# Patient Record
Sex: Male | Born: 1940
Health system: Southern US, Community
[De-identification: ages and names within clinical notes are randomized; demographics above are authoritative.]

## PROBLEM LIST (undated history)

## (undated) DIAGNOSIS — J449 Chronic obstructive pulmonary disease, unspecified: Secondary | ICD-10-CM

## (undated) DIAGNOSIS — L0390 Cellulitis, unspecified: Secondary | ICD-10-CM

## (undated) DIAGNOSIS — I639 Cerebral infarction, unspecified: Secondary | ICD-10-CM

## (undated) DIAGNOSIS — I509 Heart failure, unspecified: Secondary | ICD-10-CM

## (undated) DIAGNOSIS — G4730 Sleep apnea, unspecified: Secondary | ICD-10-CM

## (undated) DIAGNOSIS — E785 Hyperlipidemia, unspecified: Secondary | ICD-10-CM

## (undated) DIAGNOSIS — I10 Essential (primary) hypertension: Secondary | ICD-10-CM

## (undated) DIAGNOSIS — E039 Hypothyroidism, unspecified: Secondary | ICD-10-CM

## (undated) DIAGNOSIS — E079 Disorder of thyroid, unspecified: Secondary | ICD-10-CM

## (undated) DIAGNOSIS — T884XXA Failed or difficult intubation, initial encounter: Secondary | ICD-10-CM

## (undated) DIAGNOSIS — I4891 Unspecified atrial fibrillation: Secondary | ICD-10-CM

## (undated) DIAGNOSIS — N289 Disorder of kidney and ureter, unspecified: Secondary | ICD-10-CM

## (undated) HISTORY — DX: Chronic obstructive pulmonary disease, unspecified: J44.9

## (undated) HISTORY — DX: Essential (primary) hypertension: I10

## (undated) HISTORY — DX: Disorder of thyroid, unspecified: E07.9

## (undated) HISTORY — DX: Sleep apnea, unspecified: G47.30

## (undated) HISTORY — DX: Cerebral infarction, unspecified: I63.9

## (undated) HISTORY — DX: Unspecified atrial fibrillation: I48.91

## (undated) HISTORY — PX: CARDIAC CATHETERIZATION: SHX172

## (undated) HISTORY — DX: Disorder of kidney and ureter, unspecified: N28.9

## (undated) HISTORY — PX: TONSILLECTOMY: SUR1361

## (undated) HISTORY — PX: CATARACT EXTRACTION W/ INTRAOCULAR LENS  IMPLANT, BILATERAL: SHX1307

## (undated) HISTORY — DX: Hyperlipidemia, unspecified: E78.5

---

## 1977-02-14 HISTORY — PX: OTHER SURGICAL HISTORY: SHX169

## 2006-02-14 HISTORY — PX: COLONOSCOPY: SHX174

## 2006-12-29 ENCOUNTER — Ambulatory Visit: Payer: Self-pay | Admitting: Internal Medicine

## 2007-01-29 ENCOUNTER — Ambulatory Visit: Payer: Self-pay | Admitting: Gastroenterology

## 2007-02-12 ENCOUNTER — Ambulatory Visit: Payer: Self-pay | Admitting: Gastroenterology

## 2007-03-13 ENCOUNTER — Ambulatory Visit: Payer: Self-pay | Admitting: Gastroenterology

## 2008-11-18 ENCOUNTER — Inpatient Hospital Stay: Payer: Self-pay | Admitting: Internal Medicine

## 2010-03-22 ENCOUNTER — Ambulatory Visit: Payer: Self-pay | Admitting: Cardiology

## 2010-05-03 ENCOUNTER — Inpatient Hospital Stay: Payer: Self-pay | Admitting: Internal Medicine

## 2010-05-25 ENCOUNTER — Encounter: Payer: Self-pay | Admitting: Specialist

## 2010-06-15 ENCOUNTER — Encounter: Payer: Self-pay | Admitting: Specialist

## 2010-07-16 ENCOUNTER — Encounter: Payer: Self-pay | Admitting: Specialist

## 2010-08-15 ENCOUNTER — Encounter: Payer: Self-pay | Admitting: Specialist

## 2011-08-09 ENCOUNTER — Ambulatory Visit: Payer: Self-pay | Admitting: Ophthalmology

## 2011-08-30 ENCOUNTER — Ambulatory Visit: Payer: Self-pay | Admitting: Ophthalmology

## 2011-09-20 ENCOUNTER — Ambulatory Visit: Payer: Self-pay | Admitting: Ophthalmology

## 2012-06-18 ENCOUNTER — Inpatient Hospital Stay: Payer: Self-pay | Admitting: Internal Medicine

## 2012-06-18 LAB — CBC WITH DIFFERENTIAL/PLATELET
Basophil #: 0.1 10*3/uL (ref 0.0–0.1)
Eosinophil %: 0.3 %
HCT: 47.2 % (ref 40.0–52.0)
HGB: 15.9 g/dL (ref 13.0–18.0)
Lymphocyte %: 14.2 %
MCH: 33.1 pg (ref 26.0–34.0)
MCHC: 33.6 g/dL (ref 32.0–36.0)
Neutrophil #: 8.9 10*3/uL — ABNORMAL HIGH (ref 1.4–6.5)

## 2012-06-18 LAB — COMPREHENSIVE METABOLIC PANEL
Albumin: 3.7 g/dL (ref 3.4–5.0)
Anion Gap: 10 (ref 7–16)
BUN: 78 mg/dL — ABNORMAL HIGH (ref 7–18)
Bilirubin,Total: 0.9 mg/dL (ref 0.2–1.0)
Co2: 43 mmol/L (ref 21–32)
EGFR (African American): 28 — ABNORMAL LOW
EGFR (Non-African Amer.): 24 — ABNORMAL LOW
Glucose: 118 mg/dL — ABNORMAL HIGH (ref 65–99)
Osmolality: 283 (ref 275–301)
Potassium: 1.8 mmol/L — CL (ref 3.5–5.1)
Sodium: 129 mmol/L — ABNORMAL LOW (ref 136–145)
Total Protein: 7.8 g/dL (ref 6.4–8.2)

## 2012-06-18 LAB — POTASSIUM: Potassium: 2.2 mmol/L — CL (ref 3.5–5.1)

## 2012-06-18 LAB — TROPONIN I: Troponin-I: 0.28 ng/mL — ABNORMAL HIGH

## 2012-06-18 LAB — CK TOTAL AND CKMB (NOT AT ARMC)
CK, Total: 448 U/L — ABNORMAL HIGH (ref 35–232)
CK-MB: 3.6 ng/mL (ref 0.5–3.6)

## 2012-06-18 LAB — MAGNESIUM: Magnesium: 3 mg/dL — ABNORMAL HIGH

## 2012-06-19 LAB — BASIC METABOLIC PANEL
Anion Gap: 7 (ref 7–16)
Anion Gap: 4 — ABNORMAL LOW (ref 7–16)
BUN: 59 mg/dL — ABNORMAL HIGH (ref 7–18)
Co2: 38 mmol/L — ABNORMAL HIGH (ref 21–32)
Creatinine: 1.81 mg/dL — ABNORMAL HIGH (ref 0.60–1.30)
EGFR (African American): 42 — ABNORMAL LOW
Glucose: 101 mg/dL — ABNORMAL HIGH (ref 65–99)
Osmolality: 287 (ref 275–301)
Osmolality: 285 (ref 275–301)
Potassium: 2.7 mmol/L — ABNORMAL LOW (ref 3.5–5.1)

## 2012-06-19 LAB — MAGNESIUM: Magnesium: 2.8 mg/dL — ABNORMAL HIGH

## 2012-06-19 LAB — PHOSPHORUS: Phosphorus: 2.1 mg/dL — ABNORMAL LOW (ref 2.5–4.9)

## 2012-06-19 LAB — CK TOTAL AND CKMB (NOT AT ARMC): CK-MB: 2 ng/mL (ref 0.5–3.6)

## 2012-06-19 LAB — TROPONIN I: Troponin-I: 0.26 ng/mL — ABNORMAL HIGH

## 2012-06-20 LAB — BASIC METABOLIC PANEL
Anion Gap: 5 — ABNORMAL LOW (ref 7–16)
BUN: 34 mg/dL — ABNORMAL HIGH (ref 7–18)
Calcium, Total: 8.1 mg/dL — ABNORMAL LOW (ref 8.5–10.1)
Co2: 36 mmol/L — ABNORMAL HIGH (ref 21–32)
Creatinine: 1.45 mg/dL — ABNORMAL HIGH (ref 0.60–1.30)
EGFR (Non-African Amer.): 48 — ABNORMAL LOW
Glucose: 87 mg/dL (ref 65–99)
Potassium: 2.9 mmol/L — ABNORMAL LOW (ref 3.5–5.1)

## 2013-06-28 ENCOUNTER — Ambulatory Visit: Payer: Self-pay

## 2013-06-30 DIAGNOSIS — I429 Cardiomyopathy, unspecified: Secondary | ICD-10-CM | POA: Insufficient documentation

## 2013-06-30 DIAGNOSIS — I129 Hypertensive chronic kidney disease with stage 1 through stage 4 chronic kidney disease, or unspecified chronic kidney disease: Secondary | ICD-10-CM | POA: Insufficient documentation

## 2013-06-30 DIAGNOSIS — E039 Hypothyroidism, unspecified: Secondary | ICD-10-CM | POA: Insufficient documentation

## 2013-06-30 DIAGNOSIS — K220 Achalasia of cardia: Secondary | ICD-10-CM | POA: Insufficient documentation

## 2013-06-30 DIAGNOSIS — G4733 Obstructive sleep apnea (adult) (pediatric): Secondary | ICD-10-CM | POA: Insufficient documentation

## 2013-07-23 ENCOUNTER — Ambulatory Visit: Payer: Self-pay | Admitting: Internal Medicine

## 2013-07-23 LAB — CREATININE, SERUM
CREATININE: 1.59 mg/dL — AB (ref 0.60–1.30)
EGFR (Non-African Amer.): 42 — ABNORMAL LOW
GFR CALC AF AMER: 49 — AB

## 2013-08-05 ENCOUNTER — Encounter: Payer: Self-pay | Admitting: Otolaryngology

## 2013-08-14 ENCOUNTER — Encounter: Payer: Self-pay | Admitting: Otolaryngology

## 2013-09-14 ENCOUNTER — Encounter: Payer: Self-pay | Admitting: Otolaryngology

## 2013-10-15 ENCOUNTER — Encounter: Payer: Self-pay | Admitting: Otolaryngology

## 2013-11-14 ENCOUNTER — Encounter: Payer: Self-pay | Admitting: Otolaryngology

## 2014-05-06 ENCOUNTER — Encounter
Admit: 2014-05-06 | Disposition: A | Discharge status: Home / Self Care | Payer: Self-pay | Attending: Internal Medicine | Admitting: Internal Medicine

## 2014-05-16 ENCOUNTER — Encounter
Admit: 2014-05-16 | Disposition: A | Discharge status: Home / Self Care | Payer: Self-pay | Attending: Internal Medicine | Admitting: Internal Medicine

## 2014-06-03 NOTE — Op Note (Signed)
PATIENT NAME:  Daniel Eaton, Daniel Eaton MR#:  [id number] DATE OF BIRTH:  Apr 22, 1940  DATE OF PROCEDURE:  09/20/2011  PREOPERATIVE DIAGNOSIS: Visually significant cataract of the right eye.   POSTOPERATIVE DIAGNOSIS: Visually significant cataract of the right eye.   OPERATIVE PROCEDURE: Cataract extraction by phacoemulsification with implant of intraocular lens to right eye.   SURGEON: Birder Robson, MD.   ANESTHESIA:  1. Managed anesthesia care.  2. Topical tetracaine drops followed by 2% Xylocaine jelly applied in the preoperative holding area.   COMPLICATIONS: None.   TECHNIQUE:  Stop and chop.   DESCRIPTION OF PROCEDURE: The patient was examined and consented in the preoperative holding area where the aforementioned topical anesthesia was applied to the right eye and then brought back to the Operating Room where the right eye was prepped and draped in the usual sterile ophthalmic fashion and a lid speculum was placed. A paracentesis was created with the side port blade and the anterior chamber was filled with viscoelastic. A near clear corneal incision was performed with the steel keratome. A continuous curvilinear capsulorrhexis was performed with a cystotome followed by the capsulorrhexis forceps. Hydrodissection and hydrodelineation were carried out with BSS on a blunt cannula. The lens was removed in a stop and chop technique and the remaining cortical material was removed with the irrigation-aspiration handpiece. The capsular bag was inflated with viscoelastic and the Technis ZCB00 21.5-diopter lens, serial number [id number] was placed in the capsular bag without complication. The remaining viscoelastic was removed from the eye with the irrigation-aspiration handpiece. The wounds were hydrated. The anterior chamber was flushed with Miostat and the eye was inflated to physiologic pressure. The wounds were found to be water tight. The eye was dressed with Vigamox. The patient was given protective  glasses to wear throughout the day and a shield with which to sleep tonight. The patient was also given drops with which to begin a drop regimen today and will follow-up with me in one day.    ____________________________ [name]. Sheena Simonis, MD wlp:ap D: 09/20/2011 12:33:32 ET T: 09/20/2011 13:35:32 ET JOB#: [id number]  cc: [place] Carreno L. Valerie Cones, MD, <Dictator> [name] Tomica Arseneault MD ELECTRONICALLY SIGNED 09/21/2011 16:30

## 2014-06-03 NOTE — Op Note (Signed)
PATIENT NAME:  Daniel Eaton, Daniel Eaton MR#:  [id number] DATE OF BIRTH:  1940/08/02  DATE OF PROCEDURE:  08/30/2011  PREOPERATIVE DIAGNOSIS: Visually significant cataract of the left eye.   POSTOPERATIVE DIAGNOSIS: Visually significant cataract of the left eye.   OPERATIVE PROCEDURE: Cataract extraction by phacoemulsification with implant of intraocular lens to left eye.   SURGEON: Birder Robson, MD.   ANESTHESIA:  1. Managed anesthesia care.  2. Topical tetracaine drops followed by 2% Xylocaine jelly applied in the preoperative holding area.   COMPLICATIONS: None.   TECHNIQUE:  Stop and chop.   DESCRIPTION OF PROCEDURE: The patient was examined and consented in the preoperative holding area where the aforementioned topical anesthesia was applied to the left eye and then brought back to the Operating Room where the left eye was prepped and draped in the usual sterile ophthalmic fashion and a lid speculum was placed. A paracentesis was created with the side port blade and the anterior chamber was filled with viscoelastic. A near clear corneal incision was performed with the steel keratome. A continuous curvilinear capsulorrhexis was performed with a cystotome followed by the capsulorrhexis forceps. Hydrodissection and hydrodelineation were carried out with BSS on a blunt cannula. The lens was removed in a stop and chop technique and the remaining cortical material was removed with the irrigation-aspiration handpiece. The capsular bag was inflated with viscoelastic and the Technis ZCB00 23.0-diopter lens, serial number [id number] was placed in the capsular bag without complication. The remaining viscoelastic was removed from the eye with the irrigation-aspiration handpiece. The wounds were hydrated. The anterior chamber was flushed with Miostat and the eye was inflated to physiologic pressure. The wounds were found to be water tight. The eye was dressed with Vigamox. The patient was given protective glasses  to wear throughout the day and a shield with which to sleep tonight. The patient was also given drops with which to begin a drop regimen today and will follow-up with me in one day.   ____________________________ [name]. [name], MD wlp:cms D: 08/30/2011 12:32:59 ET T: 08/30/2011 13:02:13 ET JOB#: [id number]  cc: Shamere Dilworth L. Chelsei Mcchesney, MD, <Dictator> [name] Timmya Blazier MD ELECTRONICALLY SIGNED 09/08/2011 13:18

## 2014-06-06 NOTE — H&P (Signed)
PATIENT NAME:  Daniel Eaton, Daniel Eaton MR#:  [id number] DATE OF BIRTH:  1940-12-08  DATE OF ADMISSION:  06/18/2012  PRIMARY CARE PHYSICIAN: Frazier Richards, MD   CHIEF COMPLAINT: Weakness and presyncope.   HISTORY OF PRESENT ILLNESS:  This is a 74 year old male with history of atrial fibrillation with diastolic heart failure with recent fluid retention, achalasia, who presents with the above complaint. Over the past 2 weeks the patient has had fluid retention so his primary care physician asked him to use metolazone 2.5 mg p.r.n. He has been taking this for the past 2 weeks and over the past 2 days he has just been feeling very lightheaded, weak, and he had a near syncopal event today. He fell. Did not lose consciousness, but he was so weak he could not get up so EMS was called.  In the ER, he was noted to have significant electrolyte abnormalities with a low potassium and low sodium, and also new onset acute renal failure. The patient does say that he has had decreased urine output in the last several days. In the ER, he was given 40 IV of potassium and 60 p.o. of potassium.  REVIEW OF SYSTEMS:   CONSTITUTIONAL: No fever. Positive fatigue, weakness. Positive weight loss. EYES:  No blurred or double vision. Positive cataracts. ENT:  No ear pain, hearing loss, or seasonal allergies. He does snore when he is not wearing his CPAP.  RESPIRATORY: No coughing, wheezing, hemoptysis, dyspnea or painful respiration.  CARDIOVASCULAR: He denies any chest pain, palpitations or orthopnea. He has had edema, which is much improved. No syncope but he had presyncopal. He has atrial fibrillation. No dyspnea on exertion.   GASTROENTEROLOGY:  No nausea, vomiting, diarrhea, abdominal pain, melena or ulcers. GENITOURINARY:  No dysuria or hematuria. He has had decreased urine output.  ENDOCRINE: No polyuria or polydipsia. No thyroid problems.  HEME/LYMPH:  No anemia or easy bruising. SKIN: No rash or lesions.  MUSCULOSKELETAL:   He has limited activity due to body habitus. NEUROLOGIC: He has remote history of CVA.  No ataxia or dysarthria. Positive weakness. No numbness or tremor. PSYCHIATRIC: No history of anxiety or depression.   PAST MEDICAL HISTORY:  1.  CVA.  2.  Sleep apnea on CPAP.  3.  Diastolic heart failure.  4.  History of achalasia status post recent balloon dilation.  5.  History of atrial fibrillation.  6.  GERD. 7.  Hypertension.   MEDICATIONS: 1.  Pradaxa 150 b.i.d.  2.  Potassium chloride 10 milliequivalents daily.  3.  Omeprazole 40 mg daily. 4.  Multivitamin 1 tablet daily.  5.  Metoprolol 50 b.i.d.  6.  Loratadine 10 mg daily.  7.  Gingko biloba 1 tablet daily.  8.  Lasix 40 mg daily.  9.  Fish oil 1 tablet daily.  10.  Amiodarone 200 mg 2 tablets daily.   ALLERGIES: DILTIAZEM CAUSES EDEMA.   SOCIAL HISTORY: He does have a remote history over 30 years ago with smoking and alcohol.   PAST SURGICAL HISTORY:  Cataract surgery.  FAMILY HISTORY:  His father had CAD.  His mother was 34 when she passed away and he said she had multiple medical problems, but he is not sure what medical problems.   PHYSICAL EXAMINATION: VITAL SIGNS: Temperature 97.7, pulse 97, respirations 18, blood pressure 146/83, 96% on room air.  GENERAL: The patient is alert and oriented, not in acute distress.  HEENT: Head is atraumatic. Pupils are round and reactive. Sclerae anicteric. Mucous membranes are  dry. Oropharynx is clear.  NECK: Short. I could not appreciate any thyromegaly or JVD.  HEART:  Distant heart sounds without any murmurs, gallops or rubs. PMI is hard to palpate due to habitus.  LUNGS: Clear to auscultation without crackles, rales, rhonchi, or wheezing. Normal to percussion.  BACK: No CVA or vertebral tenderness.  ABDOMEN: Obese. Bowel sounds are positive. Hard to appreciate organomegaly due to body habitus.  EXTREMITIES: No clubbing, cyanosis or edema.  SKIN: He has chronic venous stasis  bilaterally. No other rashes or lesions are found.  NEUROLOGIC:  Cranial nerves II through XII are intact. No focal deficits.   LABORATORY AND DIAGNOSTICS: Sodium 129, potassium 1.8, chloride 76, bicarb 43, BUN 78, creatinine 2.55, glucose 118, calcium 9.9, bilirubin 0.9, alk phos 75, ALT 38, AST 49, total protein 7.8 and albumin 3.7. Troponin 0.02. CK 504.  CPK/MB 3.4. Magnesium 3.0.  White blood cells 11.8, hemoglobin 15.9, hematocrit 48, and platelets 285.   EKG shows sinus rhythm with AV disassociation and accelerated junctional rhythm. He had some T wave abnormalities in the anterolateral leads.  ASSESSMENT AND PLAN:  A 74 year old male who presents with presyncope and weakness and found to have severe electrolyte derangement.  1.  Presyncope, suspected to be due to electrolyte abnormalities, as outlined below.  2.  Acute renal failure, suspected to be from use of diuretic. We will hold any diuretics  and nephrotoxic agents, provide some IV fluids, monitor creatinine closely.  3.  Hypokalemia. The patient has already received 40 IV and 60 p.o. Will continue to monitor and recheck potassium later on tonight. Magnesium seems to be not an issue at this time. I suspect the hypokalemia is due to diuretic use.  4.  Hyponatremia. Once again probably related to diuretics. Provide some gentle hydration and monitor his sodium.  4.  Elevated troponin. The patient denies any chest pain. We will continue to cycle his cardiac enzymes, place the patient on telemetry. I suspect this is related to poor renal clearance. He does state that he had a cardiac catheterization not too long ago which was normal. If his troponins continue to rise, then we will obtain a cardiology consult and place the patient on heparin drip.  5.  Fluid retention. The patient's lower extremity edema has improved. He is compliant with his CPAP machine. There is a plan to order an echocardiogram for Friday so I will go ahead and order it  today and also given his elevated troponins I think this would be a good idea to order it while he is here in the hospital. We will follow up with an echocardiogram.  6.  Obstructive sleep apnea. We will provide CPAP.  7.  History of atrial fibrillation. Will continue his outpatient medications.  8.  Hypertension. Will continue on metoprolol.   CODE STATUS:  FULL CODE.  TIME SPENT:  Approximately 50 minutes.  ____________________________ Donell Beers. Benjie Karvonen, MD spm:sb D: 06/18/2012 14:07:27 ET T: 06/18/2012 14:42:06 ET JOB#: [id number]  cc: Jeremie Abdelaziz P. Benjie Karvonen, MD, <Dictator> Ocie Cornfield. Ouida Sills, MD Donell Beers Arneshia Ade MD ELECTRONICALLY SIGNED 06/18/2012 15:24

## 2014-06-06 NOTE — Discharge Summary (Signed)
PATIENT NAME:  Daniel Eaton, Daniel Eaton MR#:  [id number] DATE OF BIRTH:  [birth year]  DATE OF ADMISSION:  06/18/2012 DATE OF DISCHARGE:  06/20/2012  06/20/2012 is date of planned discharge.  DISCHARGE DIAGNOSES: 1.  Acute renal failure.  2.  Hypokalemia.  3.  Hyponatremia. 4.  History of cardiomyopathy.  5.  Chronic renal disease, stage III.   DISCHARGE MEDICATIONS: Per med reconciliation system done, and [employer] electronic medical record.    HISTORY AND PHYSICAL: Please see detailed history and physical on admission.   HOSPITAL COURSE: This patient was admitted in acute renal failure from a prerenal situation from taking too much metolazone at home. He had taken it daily for 2 weeks. He was rehydrated, and his sodium and potassium levels came up. Last potassium at this point is 2.7 on May 6th. There is a pending level is morning, and may need to further supplement before discharge. His weakness was improving. He will get further physical therapy today to ensure his strength is adequate to go home while with home health physical therapy, to where he is being discharged.    His the last sodium on record was 136 yesterday. Magnesium level was supratherapeutic. Minimally elevated troponins, CK, likely secondary to laying on the floor, being generally weak post a fall. He is sore all over, but no specific areas of pain. Echocardiogram showed 50% to  55% LV function. His a-fib was stable, without further issues.   It took approximately 34 minutes to do all discharge tasks today, with some further work pending.     ____________________________ Ocie Cornfield. Ouida Sills, MD mwa:dm D: 06/20/2012 07:46:47 ET T: 06/20/2012 07:57:06 ET JOB#: [id number]  cc: Ocie Cornfield. Ouida Sills, MD, <Dictator> [name] MD ELECTRONICALLY SIGNED 06/21/2012 0:16

## 2014-06-16 ENCOUNTER — Encounter: Payer: PPO | Attending: Internal Medicine | Admitting: Respiratory Therapy

## 2014-06-16 DIAGNOSIS — J42 Unspecified chronic bronchitis: Secondary | ICD-10-CM | POA: Insufficient documentation

## 2014-06-16 DIAGNOSIS — J449 Chronic obstructive pulmonary disease, unspecified: Secondary | ICD-10-CM

## 2014-06-16 DIAGNOSIS — J410 Simple chronic bronchitis: Secondary | ICD-10-CM

## 2014-06-16 NOTE — Progress Notes (Signed)
Daily Session Note  Patient Details  Name: Daniel Eaton MRN: [id number] Date of Birth: 09-Jun-1940 Referring Provider:  [name], MD  Encounter Date: 06/16/2014  Check In:     Session Check In - 06/16/14 1323    Check-In   Staff Present Carson Myrtle BS, RRT, Respiratory Therapist  Gerlene Burdock RN, BSN   ER physicians immediately available to respond to emergencies [facility] immediately available ER MD   Physician(s) Corky Downs and Joni Fears   Warm-up and Cool-down Performed on first and last piece of equipment   VAD Patient? No   Pain Assessment   Currently in Pain? No/denies   Multiple Pain Sites No         Goals Met:  Proper associated with RPD/PD & O2 Sat Independence with exercise equipment Using PLB without cueing & demonstrates good technique Exercise tolerated well  Goals Unmet:  Not Applicable  Goals Comments: New exercise goal on Wednesday Treadmill   Dr. Emily Filbert is Medical Director for [place] and [facility].

## 2014-06-18 ENCOUNTER — Encounter: Payer: PPO | Admitting: Respiratory Therapy

## 2014-06-18 DIAGNOSIS — J449 Chronic obstructive pulmonary disease, unspecified: Secondary | ICD-10-CM | POA: Diagnosis not present

## 2014-06-18 NOTE — Progress Notes (Signed)
Daily Session Note  Patient Details  Name: Daniel Eaton MRN: [id number] Date of Birth: 1940/05/05 Referring Provider:  [name], MD  Encounter Date: 06/18/2014  Check In:     Session Check In - 06/18/14 1259    Check-In   Staff Present Carson Myrtle BS,RRT, Respiratory Therapist; Lestine Box BS, ACSM EP-C, Exercise Physiologist; Candiss Norse MS, ACSM CEP, Exercise Physiologist   ER physicians immediately available to respond to emergencies [facility] immediately available ER MD   Physician(s) Reita Cliche and Edd Fabian   Warm-up and Cool-down Performed on first and last piece of equipment   VAD Patient? No   Pain Assessment   Currently in Pain? No/denies   Multiple Pain Sites No         Goals Met:  Proper associated with RPD/PD & O2 Sat Independence with exercise equipment Using PLB without cueing & demonstrates good technique Exercise tolerated well  Goals Unmet:  Not Applicable  Goals Comments:    Dr. Emily Filbert is Medical Director for [place] and [facility].

## 2014-06-20 ENCOUNTER — Encounter: Payer: PPO | Admitting: *Deleted

## 2014-06-20 DIAGNOSIS — J410 Simple chronic bronchitis: Secondary | ICD-10-CM

## 2014-06-20 DIAGNOSIS — J449 Chronic obstructive pulmonary disease, unspecified: Secondary | ICD-10-CM | POA: Diagnosis not present

## 2014-06-20 NOTE — Progress Notes (Signed)
Daily Session Note  Patient Details  Name: Daniel Eaton MRN: [id number] Date of Birth: 07/28/1940 Referring Provider:  [name], MD  Encounter Date: 06/20/2014  Check In:     Session Check In - 06/20/14 1234    Check-In   Staff Present Gerlene Burdock RN, BSN;Mallorie Norrod Dillard [place] MS, ACSM CEP Exercise Physiologist;Stacey Blanch Media RRT, RCP Respiratory Therapist   ER physicians immediately available to respond to emergencies [facility] immediately available ER MD   Physician(s) Karma Greaser and Lorelei Pont and Cool-down Performed on first and last piece of equipment   VAD Patient? No   Pain Assessment   Currently in Pain? No/denies   Multiple Pain Sites No         Goals Met:  Proper associated with RPD/PD & O2 Sat Independence with exercise equipment Using PLB without cueing & demonstrates good technique Exercise tolerated well  Goals Unmet:  Not Applicable  Goals Comments:    Dr. Emily Filbert is Medical Director for [place] and [facility].

## 2014-06-23 ENCOUNTER — Encounter: Payer: PPO | Admitting: *Deleted

## 2014-06-23 DIAGNOSIS — J449 Chronic obstructive pulmonary disease, unspecified: Secondary | ICD-10-CM | POA: Diagnosis not present

## 2014-06-23 NOTE — Progress Notes (Signed)
Daily Session Note  Patient Details  Name: Daniel Eaton MRN: [id number] Date of Birth: [birth year] Referring Provider:  [name], MD  Encounter Date: 06/23/2014  Check In:     Session Check In - 06/23/14 1217    Check-In   Staff Present Carson Myrtle BS, RRT, Respiratory Therapist;Roya Gieselman RN, BSN;Steven Way BS, ACSM EP-C, Exercise Physiologist   ER physicians immediately available to respond to emergencies [facility] immediately available ER MD   Physician(s) Dr. Claudette Head and Dr. Jimmye Norman   Warm-up and Cool-down Performed on first and last piece of equipment   VAD Patient? No   Pain Assessment   Currently in Pain? No/denies         Goals Met:  Proper associated with RPD/PD & O2 Sat Exercise tolerated well  Goals Unmet:  Not Applicable  Goals Comments:    Dr. Emily Filbert is Medical Director for [place] and [facility].

## 2014-06-30 ENCOUNTER — Encounter: Payer: PPO | Admitting: *Deleted

## 2014-06-30 DIAGNOSIS — J449 Chronic obstructive pulmonary disease, unspecified: Secondary | ICD-10-CM

## 2014-06-30 NOTE — Progress Notes (Signed)
Daily Session Note  Patient Details  Name: Daniel Eaton MRN: [id number] Date of Birth: 12-26-1940 Referring Provider:  [name], MD  Encounter Date: 06/30/2014  Check In:     Session Check In - 06/30/14 1254    Check-In   Staff Present Carson Myrtle BS, RRT, Respiratory Therapist;Bailynn Dyk RN, BSN;Steven Way BS, ACSM EP-C, Exercise Physiologist   ER physicians immediately available to respond to emergencies [facility] immediately available ER MD   Physician(s) Dr. Karma Greaser, Dr. Corky Downs   Medication changes reported     No   Fall or balance concerns reported    No   Warm-up and Cool-down Performed on first and last piece of equipment   VAD Patient? No   Pain Assessment   Currently in Pain? Other (Comment)  No change with chronic pain in legs.         Goals Met:  Proper associated with RPD/PD & O2 Sat Exercise tolerated well  Goals Unmet:  Not Applicable  Goals Comments:    Dr. Emily Filbert is Medical Director for [place] and [facility].

## 2014-07-02 DIAGNOSIS — J449 Chronic obstructive pulmonary disease, unspecified: Secondary | ICD-10-CM | POA: Diagnosis not present

## 2014-07-02 NOTE — Progress Notes (Signed)
Daily Session Note  Patient Details  Name: ZAVEON GILLEN MRN: [id number] Date of Birth: 1940-04-22 Referring Provider:  [name], MD  Encounter Date: 07/02/2014  Check In:     Session Check In - 07/02/14 1221    Check-In   Staff Present Lestine Box BS, ACSM EP-C, Exercise Physiologist;Laureen Janell Quiet, RRT, Respiratory Therapist;Carroll Enterkin RN, BSN   ER physicians immediately available to respond to emergencies [facility] immediately available ER MD   Physician(s) gayle and williams   Medication changes reported     No   Fall or balance concerns reported    No   Warm-up and Cool-down Performed on first and last piece of equipment   VAD Patient? No   Pain Assessment   Currently in Pain? No/denies         Goals Met:  Proper associated with RPD/PD & O2 Sat Exercise tolerated well No report of cardiac concerns or symptoms Strength training completed today  Goals Unmet:  Not Applicable  Goals Comments:   Dr. Emily Filbert is Medical Director for [place] and [facility].

## 2014-07-07 ENCOUNTER — Encounter: Payer: PPO | Admitting: *Deleted

## 2014-07-07 DIAGNOSIS — J449 Chronic obstructive pulmonary disease, unspecified: Secondary | ICD-10-CM | POA: Diagnosis not present

## 2014-07-07 DIAGNOSIS — J410 Simple chronic bronchitis: Secondary | ICD-10-CM

## 2014-07-07 NOTE — Progress Notes (Signed)
Daily Session Note  Patient Details  Name: Daniel Eaton MRN: [id number] Date of Birth: March 30, 1940 Referring Provider:  [name], MD  Encounter Date: 07/07/2014  Check In:     Session Check In - 07/07/14 1253    Check-In   Staff Present Frederich Balding MPH, CHES;Susanne Bice RN, BSN, CCRP;Laureen [employer], RRT, Respiratory Therapist;Aysia Lowder RN, BSN   ER physicians immediately available to respond to emergencies [facility] immediately available ER MD   Physician(s) Dr. Corky Downs Dr. Reita Cliche   Medication changes reported     No   Fall or balance concerns reported    No   Warm-up and Cool-down Performed on first and last piece of equipment   VAD Patient? No   Pain Assessment   Currently in Pain? Other (Comment)  No change in chronic pain   Multiple Pain Sites No           Exercise Prescription Changes - 07/07/14 1000    Exercise Review   Progression Yes   Response to Exercise   Blood Pressure (Admit) 122/78 mmHg   Blood Pressure (Exercise) 130/84 mmHg   Blood Pressure (Exit) 120/84 mmHg   Intensity THRR unchanged   Progression Continue progressive overload as per policy without signs/symptoms or physical distress.   Resistance Training   Training Prescription Yes   Weight 3   Reps 10-12   Treadmill   MPH 0.7   Grade 0   Minutes 10   NuStep   Level 3   Watts 25   Minutes 10   Recumbant Elliptical   Level 3   RPM 35   Minutes 15      Goals Met:  Proper associated with RPD/PD & O2 Sat Exercise tolerated well  Goals Unmet:  Not Applicable  Goals Comments:    Dr. Emily Filbert is Medical Director for [place] and [facility].

## 2014-07-07 NOTE — Progress Notes (Signed)
Daily Session Note  Patient Details  Name: Daniel Eaton MRN: [id number] Date of Birth: 09-01-1940 Referring Provider:  [name], MD  Encounter Date: 07/07/2014  Check In:     Session Check In - 07/07/14 1253    Check-In   Staff Present Frederich Balding MPH, CHES;Trenee Igoe RN, BSN, CCRP;Laureen [employer], RRT, Respiratory Therapist;Carroll Enterkin RN, BSN   ER physicians immediately available to respond to emergencies [facility] immediately available ER MD   Physician(s) Dr. Corky Downs Dr. Reita Cliche   Medication changes reported     No   Fall or balance concerns reported    No   Warm-up and Cool-down Performed on first and last piece of equipment   VAD Patient? No   Pain Assessment   Currently in Pain? Other (Comment)  No change in chronic pain   Multiple Pain Sites No           Exercise Prescription Changes - 07/07/14 1400    Exercise Review   Progression Yes   Response to Exercise   Blood Pressure (Admit) 122/78 mmHg   Blood Pressure (Exercise) 130/84 mmHg   Blood Pressure (Exit) 120/84 mmHg   Heart Rate (Admit) 106 bpm   Heart Rate (Exercise) 95 bpm   Heart Rate (Exit) 79 bpm   Oxygen Saturation (Admit) 96 %   Oxygen Saturation (Exercise) 96 %   Oxygen Saturation (Exit) 96 %   Rating of Perceived Exertion (Exercise) 14   Perceived Dyspnea (Exercise) 4   Intensity THRR unchanged   Progression Continue progressive overload as per policy without signs/symptoms or physical distress.   Resistance Training   Training Prescription Yes   Weight 3   Reps 10-12   Treadmill   MPH 0.7   Grade 0   Minutes 10   NuStep   Level 3   Watts 25   Minutes 10   Recumbant Elliptical   Level 3   RPM 35   Minutes 15      Goals Met:  Independence with exercise equipment Exercise tolerated well Strength training completed today  Goals Unmet:  Not Applicable  Goals Comments:    Dr. Emily Filbert is Medical Director for [place] and [facility].

## 2014-07-07 NOTE — Progress Notes (Signed)
Pulmonary Individual Treatment Plan  Patient Details  Name: Daniel Eaton MRN: [id number] Date of Birth: Apr 21, 1940 Referring Provider:  [name], MD  Initial Encounter Date:    Visit Diagnosis: Morbid obesity  COPD, mild  Simple chronic bronchitis  Patient's Home Medications on Admission:  Current outpatient prescriptions:  .  amiodarone (PACERONE) 200 MG tablet, Take by mouth., Disp: , Rfl:  .  dabigatran (PRADAXA) 150 MG CAPS capsule, Take by mouth., Disp: , Rfl:  .  diphenhydrAMINE (BENADRYL) 25 mg capsule, Take by mouth., Disp: , Rfl:  .  levothyroxine (SYNTHROID, LEVOTHROID) 75 MCG tablet, Take by mouth., Disp: , Rfl:  .  metolazone (ZAROXOLYN) 2.5 MG tablet, Take by mouth., Disp: , Rfl:  .  metoprolol (LOPRESSOR) 100 MG tablet, Take by mouth., Disp: , Rfl:  .  Multiple Vitamin (MULTI-VITAMINS) TABS, Take by mouth., Disp: , Rfl:  .  omeprazole (PRILOSEC) 40 MG capsule, Take by mouth., Disp: , Rfl:  .  spironolactone (ALDACTONE) 25 MG tablet, Take by mouth., Disp: , Rfl:  .  torsemide (DEMADEX) 20 MG tablet, Take by mouth., Disp: , Rfl:   Past Medical History: No past medical history on file.  Tobacco Use: History  Smoking status  . Not on file  Smokeless tobacco  . Not on file    Labs: Recent Review Flowsheet Data    There is no flowsheet data to display.       ADL UCSD:     ADL UCSD      05/06/14 0900 06/30/14 1130     ADL UCSD   ADL Phase Entry Mid    SOB Score total 91 79    Rest 1 0    Walk 1 0    Stairs 5 5    Bath 3 2    Dress 2 2    Shop 5 5        Pulmonary Function Assessment:   Exercise Target Goals:    Exercise Program Goal: Individual exercise prescription set with THRR, safety & activity barriers. Participant demonstrates ability to understand and report RPE using BORG scale, to self-measure pulse accurately, and to acknowledge the importance of the exercise prescription.  Exercise Prescription Goal: Starting  with aerobic activity 30 plus minutes a day, 3 days per week for initial exercise prescription. Provide home exercise prescription and guidelines that participant acknowledges understanding prior to discharge.  Activity Barriers & Risk Stratification:   6 Minute Walk:     6 Minute Walk      05/06/14 0900 06/30/14 1130     6 Minute Walk   Phase  Mid Program    Distance 415 feet 600 feet    Walk Time 4.3 minutes 6 minutes    Resting HR 83 bpm 83 bpm    Resting BP 132/80 mmHg 122/80 mmHg    Max Ex. HR 106 bpm 92 bpm    Max Ex. BP 132/82 mmHg 164/82 mmHg    RPE 15 15    Perceived Dyspnea  5 5       Initial Exercise Prescription:   Exercise Prescription Changes:     Exercise Prescription Changes      07/02/14 1500 07/07/14 1000 07/07/14 1400       Exercise Review   Progression Yes Yes Yes     Response to Exercise   Blood Pressure (Admit)  122/78 mmHg 122/78 mmHg     Blood Pressure (Exercise)  130/84 mmHg 130/84 mmHg  Blood Pressure (Exit)  120/84 mmHg 120/84 mmHg     Heart Rate (Admit)   106 bpm     Heart Rate (Exercise)   95 bpm     Heart Rate (Exit)   79 bpm     Oxygen Saturation (Admit)   96 %     Oxygen Saturation (Exercise)   96 %     Oxygen Saturation (Exit)   96 %     Rating of Perceived Exertion (Exercise)   14     Perceived Dyspnea (Exercise)   4     Intensity  THRR unchanged THRR unchanged     Progression  Continue progressive overload as per policy without signs/symptoms or physical distress. Continue progressive overload as per policy without signs/symptoms or physical distress.     Resistance Training   Training Prescription  Yes Yes     Weight  3 3     Reps  10-12 10-12     Treadmill   MPH 0.7 0.7 0.7     Grade 0 0 0     Minutes 10 10 10      NuStep   Level  3 3     Watts  25 25     Minutes  10 10     Recumbant Elliptical   Level  3 3     RPM  35 35     Minutes  15 15        Discharge Exercise Prescription:    Nutrition:  Target  Goals: Understanding of nutrition guidelines, daily intake of sodium 1500mg , cholesterol 200mg , calories 30% from fat and 7% or less from saturated fats, daily to have 5 or more servings of fruits and vegetables.  Biometrics:    Nutrition Therapy Plan and Nutrition Goals:   Nutrition Discharge: Rate Your Plate Scores:   Psychosocial: Target Goals: Acknowledge presence or absence of depression, maximize coping skills, provide positive support system. Participant is able to verbalize types and ability to use techniques and skills needed for reducing stress and depression.  Initial Review & Psychosocial Screening:   Quality of Life Scores:   PHQ-9:     Recent Review Flowsheet Data    There is no flowsheet data to display.      Psychosocial Evaluation and Intervention:   Psychosocial Re-Evaluation:     Psychosocial Re-Evaluation      06/23/14 1603           Psychosocial Re-Evaluation   Interventions Therapist referral       Comments --  Follow up with Mr. L reporting finally connected with therapist recommended by counselor and has an appointment for this Thursday 5/12.  Mr. Carlean Jews was pleased about this and looking forward to this session.  Counselor will continue to follow as needed.          Education: Education Goals: Education classes will be provided on a weekly basis, covering required topics. Participant will state understanding/return demonstration of topics presented.  Learning Barriers/Preferences:   Education Topics: Initial Evaluation Education: - Verbal, written and demonstration of respiratory meds, RPE/PD scales, oximetry and breathing techniques. Instruction on use of nebulizers and MDIs: cleaning and proper use, rinsing mouth with steroid doses and importance of monitoring MDI activations.   General Nutrition Guidelines/Fats and Fiber: -Group instruction provided by verbal, written material, models and posters to present the general guidelines for  heart healthy nutrition. Gives an explanation and review of dietary fats and fiber.   Controlling Sodium/Reading Food Labels: -  Group verbal and written material supporting the discussion of sodium use in heart healthy nutrition. Review and explanation with models, verbal and written materials for utilization of the food label.   Exercise Physiology & Risk Factors: - Group verbal and written instruction with models to review the exercise physiology of the cardiovascular system and associated critical values. Details cardiovascular disease risk factors and the goals associated with each risk factor.   Aerobic Exercise & Resistance Training: - Gives group verbal and written discussion on the health impact of inactivity. On the components of aerobic and resistive training programs and the benefits of this training and how to safely progress through these programs.   Flexibility, Balance, General Exercise Guidelines: - Provides group verbal and written instruction on the benefits of flexibility and balance training programs. Provides general exercise guidelines with specific guidelines to those with heart or lung disease. Demonstration and skill practice provided.   Stress Management: - Provides group verbal and written instruction about the health risks of elevated stress, cause of high stress, and healthy ways to reduce stress.   Depression: - Provides group verbal and written instruction on the correlation between heart/lung disease and depressed mood, treatment options, and the stigmas associated with seeking treatment.   Exercise & Equipment Safety: - Individual verbal instruction and demonstration of equipment use and safety with use of the equipment.   Infection Prevention: - Provides verbal and written material to individual with discussion of infection control including proper hand washing and proper equipment cleaning during exercise session.   Falls Prevention: - Provides  verbal and written material to individual with discussion of falls prevention and safety.   Diabetes: - Individual verbal and written instruction to review signs/symptoms of diabetes, desired ranges of glucose level fasting, after meals and with exercise. Advice that pre and post exercise glucose checks will be done for 3 sessions at entry of program.   Chronic Lung Diseases: - Group verbal and written instruction to review new updates, new respiratory medications, new advancements in procedures and treatments. Provide informative websites and "800" numbers of self-education.   Lung Procedures: - Group verbal and written instruction to describe testing methods done to diagnose lung disease. Review the outcome of test results. Describe the treatment choices: Pulmonary Function Tests, ABGs and oximetry.   Energy Conservation: - Provide group verbal and written instruction for methods to conserve energy, plan and organize activities. Instruct on pacing techniques, use of adaptive equipment and posture/positioning to relieve shortness of breath.   Triggers: - Group verbal and written instruction to review types of environmental controls: home humidity, furnaces, filters, dust mite/pet prevention, HEPA vacuums. To discuss weather changes, air quality and the benefits of nasal washing.   Exacerbations: - Group verbal and written instruction to provide: warning signs, infection symptoms, calling MD promptly, preventive modes, and value of vaccinations. Review: effective airway clearance, coughing and/or vibration techniques. Create an Sports administrator.   Oxygen: - Individual and group verbal and written instruction on oxygen therapy. Includes supplement oxygen, available portable oxygen systems, continuous and intermittent flow rates, oxygen safety, concentrators, and Medicare reimbursement for oxygen.   Respiratory Medications: - Group verbal and written instruction to review medications for  lung disease. Drug class, frequency, complications, importance of spacers, rinsing mouth after steroid MDI's, and proper cleaning methods for nebulizers.   AED/CPR: - Group verbal and written instruction with the use of models to demonstrate the basic use of the AED with the basic ABC's of resuscitation.   Breathing Retraining: -  Provides individuals verbal and written instruction on purpose, frequency, and proper technique of diaphragmatic breathing and pursed-lipped breathing. Applies individual practice skills.   Anatomy and Physiology of the Lungs: - Group verbal and written instruction with the use of models to provide basic lung anatomy and physiology related to function, structure and complications of lung disease.   Heart Failure: - Group verbal and written instruction on the basics of heart failure: signs/symptoms, treatments, explanation of ejection fraction, enlarged heart and cardiomyopathy.   Sleep Apnea: - Individual verbal and written instruction to review Obstructive Sleep Apnea. Review of risk factors, methods for diagnosing and types of masks and machines for OSA.   Anxiety: - Provides group, verbal and written instruction on the correlation between heart/lung disease and anxiety, treatment options, and management of anxiety.   Relaxation: - Provides group, verbal and written instruction about the benefits of relaxation for patients with heart/lung disease. Also provides patients with examples of relaxation techniques.   Knowledge Questionnaire Score:   Personal Goals and Risk Factors at Admission:     Personal Goals and Risk Factors at Admission - 06/30/14 1506    Personal Goals and Risk Factors on Admission    Weight Management Yes   Intervention Learn and follow the exercise and diet guidelines while in the program. Utilize the nutrition and education classes to help gain knowledge of the diet and exercise expectations in the program   Admit Weight 350 lb  (158.759 kg)   Increase Aerobic Exercise and Physical Activity Yes   Intervention While in program, learn and follow the exercise prescription taught. Start at a low level workload and increase workload after able to maintain previous level for 30 minutes. Increase time before increasing intensity.   Understand more about Heart/Pulmonary Disease. Yes   Intervention While in program utilize professionals for any questions, and attend the education sessions. Great websites to use are www.americanheart.org or www.lung.org for reliable information.   Improve shortness of breath with ADL's Yes   Intervention While in program, learn and follow the exercise prescription taught. Start at a low level workload and increase workload ad advised by the exercise physiologist. Increase time before increasing intensity.   Develop more efficient breathing techniques such as purse lipped breathing and diaphragmatic breathing; and practicing self-pacing with activity Yes   Intervention While in program, learn and utilize the specific breathing techniques taught to you. Continue to practice and use the techniques as needed.   Hypertension Yes   Goal Participant will see blood pressure controlled within the values of 140/3mm/Hg or within value directed by their physician.   Intervention Provide nutrition & aerobic exercise along with prescribed medications to achieve BP 140/90 or less.   Stress Yes   Goal To meet with psychosocial counselor for stress and relaxation information and guidance. To state understanding of performing relaxation techniques and or identifying personal stressors.   Intervention Provide education on types of stress, identifiying stressors, and ways to cope with stress. Provide demonstration and active practice of relaxation techniques.      Personal Goals and Risk Factors Review:      Goals and Risk Factor Review      06/30/14 1510 07/02/14 1130         Increase Aerobic Exercise and  Physical Activity   Goals Progress/Improvement seen  Yes Yes      Comments Mid 19mwd - Mr Thain improved by 163ft and he could walk the entire 6 minutes. Mr Popelka started a new exercise  goal on the treadmill. His stamina has improved to walk on the TM.      Improve shortness of breath with ADL's   Goals Progress/Improvement seen  Yes       Comments Mr Riedesel improved his UCSD Shortness of breath scores by 12 points and states he has less shortness of breath with ADL.          Personal Goals Discharge:    Comments: 30 day review

## 2014-07-09 ENCOUNTER — Encounter: Payer: PPO | Admitting: *Deleted

## 2014-07-09 DIAGNOSIS — J449 Chronic obstructive pulmonary disease, unspecified: Secondary | ICD-10-CM | POA: Diagnosis not present

## 2014-07-09 NOTE — Progress Notes (Signed)
Daily Session Note  Patient Details  Name: Daniel Eaton MRN: [id number] Date of Birth: August 14, 1940 Referring Provider:  [name], MD  Encounter Date: 07/09/2014  Check In:     Session Check In - 07/09/14 1158    Check-In   Staff Present Gerlene Burdock RN, Drusilla Kanner MS, ACSM CEP Exercise Physiologist;Steven Way BS, ACSM EP-C, Exercise Physiologist;Laureen Janell Quiet, RRT, Respiratory Therapist   ER physicians immediately available to respond to emergencies [facility] immediately available ER MD   Physician(s) Cinda Quest and Jimmye Norman   Medication changes reported     No   Fall or balance concerns reported    No   Warm-up and Cool-down Performed on first and last piece of equipment   VAD Patient? No   Pain Assessment   Currently in Pain? No/denies   Multiple Pain Sites No         Goals Met:  Proper associated with RPD/PD & O2 Sat Independence with exercise equipment Using PLB without cueing & demonstrates good technique Exercise tolerated well Strength training completed today  Goals Unmet:  Not Applicable  Goals Comments:    Dr. Emily Filbert is Medical Director for [place] and [facility].

## 2014-07-09 NOTE — Progress Notes (Signed)
Daily Session Note  Patient Details  Name: AULTON ROUTT MRN: [id number] Date of Birth: 1940/06/20 Referring Provider:  [name], MD  Encounter Date: 07/09/2014  Check In:     Session Check In - 07/09/14 1158    Check-In   Staff Present Gerlene Burdock RN, Drusilla Kanner MS, ACSM CEP Exercise Physiologist;Steven Way BS, ACSM EP-C, Exercise Physiologist;Laureen Janell Quiet, RRT, Respiratory Therapist   ER physicians immediately available to respond to emergencies [facility] immediately available ER MD   Physician(s) Cinda Quest and Jimmye Norman   Medication changes reported     No   Fall or balance concerns reported    No   Warm-up and Cool-down Performed on first and last piece of equipment   VAD Patient? No   Pain Assessment   Currently in Pain? No/denies   Multiple Pain Sites No         Goals Met:  Proper associated with RPD/PD & O2 Sat Independence with exercise equipment Exercise tolerated well Strength training completed today  Goals Unmet:  Not Applicable  Goals Comments: Mr Malkin has an appointment with a vascular surgeon for his legs; referred by Dr Ouida Sills.  Dr. Emily Filbert is Medical Director for [place] and [facility].

## 2014-07-11 ENCOUNTER — Encounter: Payer: PPO | Admitting: Respiratory Therapy

## 2014-07-11 DIAGNOSIS — J449 Chronic obstructive pulmonary disease, unspecified: Secondary | ICD-10-CM | POA: Diagnosis not present

## 2014-07-11 NOTE — Progress Notes (Signed)
Daily Session Note  Patient Details  Name: Daniel Eaton MRN: [id number] Date of Birth: 1940-05-25 Referring Provider:  [name], MD  Encounter Date: 07/11/2014  Check In:     Session Check In - 07/11/14 1500    Check-In   Staff Present Gerlene Burdock RN, BSN;Jearline Hirschhorn Blanch Media RRT, RCP Respiratory Therapist;Renee Dillard Essex MS, ACSM CEP Exercise Physiologist   ER physicians immediately available to respond to emergencies [facility] immediately available ER MD   Physician(s) Dr. Reita Cliche and Dr. Corky Downs   Medication changes reported     No   Fall or balance concerns reported    No   Warm-up and Cool-down Performed on first and last piece of equipment         Goals Met:  Proper associated with RPD/PD & O2 Sat Independence with exercise equipment Exercise tolerated well  Goals Unmet:  Not Applicable  Goals Comments:    Dr. Emily Filbert is Medical Director for [place] and [facility].

## 2014-07-16 ENCOUNTER — Encounter: Payer: PPO | Attending: Internal Medicine | Admitting: *Deleted

## 2014-07-16 DIAGNOSIS — J449 Chronic obstructive pulmonary disease, unspecified: Secondary | ICD-10-CM | POA: Diagnosis not present

## 2014-07-16 DIAGNOSIS — J42 Unspecified chronic bronchitis: Secondary | ICD-10-CM | POA: Diagnosis present

## 2014-07-16 NOTE — Progress Notes (Signed)
Daily Session Note  Patient Details  Name: Daniel Eaton MRN: [id number] Date of Birth: 16-Sep-1940 Referring Provider:  [name], MD  Encounter Date: 07/16/2014  Check In:     Session Check In - 07/16/14 1214    Check-In   Staff Present Gerlene Burdock RN, BSN;Stacey Blanch Media RRT, RCP Respiratory Therapist;Renee Dillard Essex MS, ACSM CEP Exercise Physiologist   ER physicians immediately available to respond to emergencies [facility] immediately available ER MD   Physician(s) Dr. Reita Cliche and Dr. Jimmye Norman   Medication changes reported     No   Fall or balance concerns reported    No   Warm-up and Cool-down Performed on first and last piece of equipment   VAD Patient? No         Goals Met:  Proper associated with RPD/PD & O2 Sat Personal goals reviewed  Goals Unmet:  Not Applicable  Goals Comments:    Dr. Emily Filbert is Medical Director for [place] and [facility].

## 2014-07-23 ENCOUNTER — Encounter: Payer: PPO | Admitting: *Deleted

## 2014-07-23 DIAGNOSIS — J410 Simple chronic bronchitis: Secondary | ICD-10-CM

## 2014-07-23 DIAGNOSIS — J449 Chronic obstructive pulmonary disease, unspecified: Secondary | ICD-10-CM

## 2014-07-23 NOTE — Progress Notes (Signed)
Daily Session Note  Patient Details  Name: Daniel Eaton MRN: [id number] Date of Birth: 1941/02/06 Referring Provider:  [name], MD  Encounter Date: 07/23/2014  Check In:     Session Check In - 07/23/14 1159    Check-In   Staff Present Candiss Norse MS, ACSM CEP Exercise Physiologist;Steven Way BS, ACSM EP-C, Exercise Physiologist;Laureen Janell Quiet, RRT, Respiratory Therapist   ER physicians immediately available to respond to emergencies [facility] immediately available ER MD   Physician(s) Claudette Head and Archie Balboa   Medication changes reported     No   Fall or balance concerns reported    No   Warm-up and Cool-down Performed on first and last piece of equipment   VAD Patient? No   Pain Assessment   Currently in Pain? No/denies   Multiple Pain Sites No         Goals Met:  Proper associated with RPD/PD & O2 Sat Independence with exercise equipment Exercise tolerated well Strength training completed today  Goals Unmet:  Not Applicable  Goals Comments: [name]'s weight was 332.0 lbs - a 17 lb drop from last visit at 349.5. He has been taking his fluid pills, working on his new diet, and watching his salt intake. He also states that his new councilor is really helping him make healthy decisions for his life.  Dr. Emily Filbert is Medical Director for [place] and [facility].

## 2014-07-23 NOTE — Progress Notes (Signed)
Daily Session Note  Patient Details  Name: Daniel Eaton MRN: [id number] Date of Birth: 10/23/1940 Referring Provider:  Anderson, Marshall W, MD  Encounter Date: 07/23/2014  Check In:     Session Check In - 07/23/14 1159    Check-In   Staff Present   MS, ACSM CEP Exercise Physiologist;Steven Way BS, ACSM EP-C, Exercise Physiologist;Laureen Brown BS, RRT, Respiratory Therapist   ER physicians immediately available to respond to emergencies [facility] immediately available ER MD   Physician(s) Schaevit and Goodman   Medication changes reported     No   Fall or balance concerns reported    No   Warm-up and Cool-down Performed on first and last piece of equipment   VAD Patient? No   Pain Assessment   Currently in Pain? No/denies   Multiple Pain Sites No         Goals Met:  Proper associated with RPD/PD & O2 Sat Independence with exercise equipment Using PLB without cueing & demonstrates good technique Exercise tolerated well Personal goals reviewed Strength training completed today  Goals Unmet:  Not Applicable  Goals Comments:   Dr. Mark Miller is Medical Director for [facility] and [facility]. 

## 2014-07-25 ENCOUNTER — Encounter: Payer: PPO | Admitting: *Deleted

## 2014-07-25 DIAGNOSIS — J449 Chronic obstructive pulmonary disease, unspecified: Secondary | ICD-10-CM | POA: Diagnosis not present

## 2014-07-25 NOTE — Progress Notes (Signed)
Daily Session Note  Patient Details  Name: Daniel Eaton MRN: [id number] Date of Birth: [birth year] Referring Provider:  [name], MD  Encounter Date: 07/25/2014  Check In:     Session Check In - 07/25/14 1246    Check-In   Staff Present Candiss Norse MS, ACSM CEP Exercise Physiologist;Stacey Blanch Media RRT, RCP Respiratory Therapist;Johnston Maddocks RN, BSN   ER physicians immediately available to respond to emergencies [facility] immediately available ER MD   Physician(s) Dr. Jacqualine Code, and Dr. Jimmye Norman   Medication changes reported     No   Fall or balance concerns reported    No   Warm-up and Cool-down Performed on first and last piece of equipment   VAD Patient? No   Pain Assessment   Currently in Pain? Other (Comment)  No change from chronic knee pain.          Goals Met:  Proper associated with RPD/PD & O2 Sat Exercise tolerated well  Goals Unmet:  Not Applicable  Goals Comments:    Dr. Emily Filbert is Medical Director for [place] and [facility].

## 2014-07-28 ENCOUNTER — Encounter: Payer: PPO | Admitting: *Deleted

## 2014-07-28 DIAGNOSIS — J449 Chronic obstructive pulmonary disease, unspecified: Secondary | ICD-10-CM

## 2014-07-28 NOTE — Progress Notes (Signed)
Daily Session Note  Patient Details  Name: RYNE MCTIGUE MRN: [id number] Date of Birth: 1940-03-14 Referring Provider:  [name], MD  Encounter Date: 07/28/2014  Check In:     Session Check In - 07/28/14 1235    Check-In   Staff Present Gerlene Burdock RN, BSN;Steven Way BS, ACSM EP-C, Exercise Physiologist   ER physicians immediately available to respond to emergencies [facility] immediately available ER MD   Physician(s) Dr. Archie Balboa Dr. Clearnce Hasten   Medication changes reported     No   Fall or balance concerns reported    No   Warm-up and Cool-down Performed on first and last piece of equipment   VAD Patient? No   Pain Assessment   Currently in Pain? No/denies           Exercise Prescription Changes - 07/28/14 1200    Exercise Review   Progression Yes   NuStep   Level 4  T5 Nu step   Watts 30      Goals Met:  Proper associated with RPD/PD & O2 Sat Exercise tolerated well  Goals Unmet:  Not Applicable  Goals Comments:    Dr. Emily Filbert is Medical Director for [place] and [facility].

## 2014-07-30 ENCOUNTER — Encounter: Payer: PPO | Admitting: *Deleted

## 2014-07-30 DIAGNOSIS — J449 Chronic obstructive pulmonary disease, unspecified: Secondary | ICD-10-CM | POA: Diagnosis not present

## 2014-07-30 NOTE — Progress Notes (Signed)
Daily Session Note  Patient Details  Name: Daniel Eaton MRN: [id number] Date of Birth: 02/03/1941 Referring Provider:  [name], MD  Encounter Date: 07/30/2014  Check In:     Session Check In - 07/30/14 1145    Check-In   Staff Present Laureen Owens Shark BS, RRT, Respiratory Therapist;Ozelle Brubacher RN, BSN;Steven Way BS, ACSM EP-C, Exercise Physiologist   ER physicians immediately available to respond to emergencies [facility] immediately available ER MD   Physician(s) Dr. Edd Fabian and Dr. Reita Cliche   Medication changes reported     No   Fall or balance concerns reported    No   Warm-up and Cool-down Performed on first and last piece of equipment   VAD Patient? No   Pain Assessment   Currently in Pain? No/denies   Multiple Pain Sites No         Goals Met:  Proper associated with RPD/PD & O2 Sat Exercise tolerated well  Goals Unmet:  Not Applicable  Goals Comments:    Dr. Emily Filbert is Medical Director for [place] and [facility].

## 2014-08-01 ENCOUNTER — Encounter: Payer: PPO | Admitting: *Deleted

## 2014-08-01 DIAGNOSIS — J449 Chronic obstructive pulmonary disease, unspecified: Secondary | ICD-10-CM | POA: Diagnosis not present

## 2014-08-01 NOTE — Progress Notes (Signed)
Daily Session Note  Patient Details  Name: Daniel Eaton MRN: [id number] Date of Birth: February 12, 1941 Referring Provider:  [name], MD  Encounter Date: 08/01/2014  Check In:     Session Check In - 08/01/14 1154    Check-In   Staff Present Candiss Norse MS, ACSM CEP Exercise Physiologist;Stacey Blanch Media RRT, RCP Respiratory Therapist;Baudelio Karnes RN, BSN   ER physicians immediately available to respond to emergencies [facility] immediately available ER MD   Medication changes reported     No   Fall or balance concerns reported    No   Warm-up and Cool-down Performed on first and last piece of equipment   VAD Patient? No   Pain Assessment   Currently in Pain? No/denies         Goals Met:  Proper associated with RPD/PD & O2 Sat Exercise tolerated well  Goals Unmet:  Not Applicable  Goals Comments:    Dr. Emily Filbert is Medical Director for [place] and [facility].

## 2014-08-04 ENCOUNTER — Encounter: Payer: PPO | Admitting: *Deleted

## 2014-08-04 DIAGNOSIS — J449 Chronic obstructive pulmonary disease, unspecified: Secondary | ICD-10-CM | POA: Diagnosis not present

## 2014-08-04 NOTE — Progress Notes (Signed)
Pulmonary Individual Treatment Plan  Patient Details  Name: Daniel Eaton MRN: [id number] Date of Birth: [birth year] Referring Provider:  [name], MD  Initial Encounter Date:    Visit Diagnosis: Obesity, morbid  Patient's Home Medications on Admission:  Current outpatient prescriptions:    amiodarone (PACERONE) 200 MG tablet, Take by mouth., Disp: , Rfl:    dabigatran (PRADAXA) 150 MG CAPS capsule, Take by mouth., Disp: , Rfl:    diphenhydrAMINE (BENADRYL) 25 mg capsule, Take by mouth., Disp: , Rfl:    levothyroxine (SYNTHROID, LEVOTHROID) 75 MCG tablet, Take by mouth., Disp: , Rfl:    metolazone (ZAROXOLYN) 2.5 MG tablet, Take by mouth., Disp: , Rfl:    metoprolol (LOPRESSOR) 100 MG tablet, Take by mouth., Disp: , Rfl:    Multiple Vitamin (MULTI-VITAMINS) TABS, Take by mouth., Disp: , Rfl:    omeprazole (PRILOSEC) 40 MG capsule, Take by mouth., Disp: , Rfl:    spironolactone (ALDACTONE) 25 MG tablet, Take by mouth., Disp: , Rfl:    torsemide (DEMADEX) 20 MG tablet, Take by mouth., Disp: , Rfl:   Past Medical History: No past medical history on file.  Tobacco Use: History  Smoking status   Not on file  Smokeless tobacco   Not on file    Labs: Recent Review Flowsheet Data    There is no flowsheet data to display.       ADL UCSD:     ADL UCSD      05/06/14 0900 06/30/14 1130     ADL UCSD   ADL Phase Entry Mid    SOB Score total 91 79    Rest 1 0    Walk 1 0    Stairs 5 5    Bath 3 2    Dress 2 2    Shop 5 5        Pulmonary Function Assessment:   Exercise Target Goals:    Exercise Program Goal: Individual exercise prescription set with THRR, safety & activity barriers. Participant demonstrates ability to understand and report RPE using BORG scale, to self-measure pulse accurately, and to acknowledge the importance of the exercise prescription.  Exercise Prescription Goal: Starting with aerobic activity 30 plus minutes a day,  3 days per week for initial exercise prescription. Provide home exercise prescription and guidelines that participant acknowledges understanding prior to discharge.  Activity Barriers & Risk Stratification:   6 Minute Walk:     6 Minute Walk      05/06/14 0900 06/30/14 1130     6 Minute Walk   Phase  Mid Program    Distance 415 feet 600 feet    Walk Time 4.3 minutes 6 minutes    Resting HR 83 bpm 83 bpm    Resting BP 132/80 mmHg 122/80 mmHg    Max Ex. HR 106 bpm 92 bpm    Max Ex. BP 132/82 mmHg 164/82 mmHg    RPE 15 15    Perceived Dyspnea  5 5       Initial Exercise Prescription:   Exercise Prescription Changes:     Exercise Prescription Changes      07/02/14 1500 07/07/14 1000 07/07/14 1400 07/28/14 1200 07/30/14 1400   Exercise Review   Progression Yes Yes Yes Yes Yes   Response to Exercise   Blood Pressure (Admit)  122/78 mmHg 122/78 mmHg  120/80 mmHg   Blood Pressure (Exercise)  130/84 mmHg 130/84 mmHg  132/82 mmHg   Blood Pressure (Exit)  120/84  mmHg 120/84 mmHg  118/70 mmHg   Heart Rate (Admit)   106 bpm  78 bpm   Heart Rate (Exercise)   95 bpm  112 bpm   Heart Rate (Exit)   79 bpm  72 bpm   Oxygen Saturation (Admit)   96 %  97 %   Oxygen Saturation (Exercise)   96 %  97 %   Oxygen Saturation (Exit)   96 %  96 %   Rating of Perceived Exertion (Exercise)   14  15   Perceived Dyspnea (Exercise)   4  3   Intensity  THRR unchanged THRR unchanged     Progression  Continue progressive overload as per policy without signs/symptoms or physical distress. Continue progressive overload as per policy without signs/symptoms or physical distress.     Resistance Training   Training Prescription  Yes Yes     Weight  3 3     Reps  10-12 10-12     Treadmill   MPH 0.7 0.7 0.7     Grade 0 0 0     Minutes 10 10 10      NuStep   Level  3 3 4   T5 Nu step    Watts  25 25 30     Minutes  10 10     Recumbant Elliptical   Level  3 3     RPM  35 35     Minutes  15 15        08/04/14 1200           Exercise Review   Progression Yes       Response to Exercise   Blood Pressure (Admit) 120/80 mmHg       Blood Pressure (Exercise) 132/82 mmHg       Blood Pressure (Exit) 118/70 mmHg       Heart Rate (Admit) 78 bpm       Heart Rate (Exercise) 112 bpm       Heart Rate (Exit) 72 bpm       Oxygen Saturation (Admit) 97 %       Oxygen Saturation (Exercise) 97 %       Oxygen Saturation (Exit) 96 %       Rating of Perceived Exertion (Exercise) 15       Perceived Dyspnea (Exercise) 3       Progression Continue progressive overload as per policy without signs/symptoms or physical distress.       Recumbant Elliptical   Level 4       Minutes 15          Discharge Exercise Prescription (Final Exercise Prescription Changes):     Exercise Prescription Changes - 08/04/14 1200    Exercise Review   Progression Yes   Response to Exercise   Blood Pressure (Admit) 120/80 mmHg   Blood Pressure (Exercise) 132/82 mmHg   Blood Pressure (Exit) 118/70 mmHg   Heart Rate (Admit) 78 bpm   Heart Rate (Exercise) 112 bpm   Heart Rate (Exit) 72 bpm   Oxygen Saturation (Admit) 97 %   Oxygen Saturation (Exercise) 97 %   Oxygen Saturation (Exit) 96 %   Rating of Perceived Exertion (Exercise) 15   Perceived Dyspnea (Exercise) 3   Progression Continue progressive overload as per policy without signs/symptoms or physical distress.   Recumbant Elliptical   Level 4   Minutes 15       Nutrition:  Target Goals: Understanding of nutrition guidelines, daily intake of  sodium 1500mg , cholesterol 200mg , calories 30% from fat and 7% or less from saturated fats, daily to have 5 or more servings of fruits and vegetables.  Biometrics:    Nutrition Therapy Plan and Nutrition Goals:   Nutrition Discharge: Rate Your Plate Scores:   Psychosocial: Target Goals: Acknowledge presence or absence of depression, maximize coping skills, provide positive support system. Participant is able  to verbalize types and ability to use techniques and skills needed for reducing stress and depression.  Initial Review & Psychosocial Screening:   Quality of Life Scores:   PHQ-9:     Recent Review Flowsheet Data    There is no flowsheet data to display.      Psychosocial Evaluation and Intervention:   Psychosocial Re-Evaluation:     Psychosocial Re-Evaluation      06/23/14 1603           Psychosocial Re-Evaluation   Interventions Therapist referral       Comments --  Follow up with Mr. L reporting finally connected with therapist recommended by counselor and has an appointment for this Thursday 5/12.  Mr. Carlean Jews was pleased about this and looking forward to this session.  Counselor will continue to follow as needed.          Education: Education Goals: Education classes will be provided on a weekly basis, covering required topics. Participant will state understanding/return demonstration of topics presented.  Learning Barriers/Preferences:   Education Topics: Initial Evaluation Education: - Verbal, written and demonstration of respiratory meds, RPE/PD scales, oximetry and breathing techniques. Instruction on use of nebulizers and MDIs: cleaning and proper use, rinsing mouth with steroid doses and importance of monitoring MDI activations.   General Nutrition Guidelines/Fats and Fiber: -Group instruction provided by verbal, written material, models and posters to present the general guidelines for heart healthy nutrition. Gives an explanation and review of dietary fats and fiber.   Controlling Sodium/Reading Food Labels: -Group verbal and written material supporting the discussion of sodium use in heart healthy nutrition. Review and explanation with models, verbal and written materials for utilization of the food label.   Exercise Physiology & Risk Factors: - Group verbal and written instruction with models to review the exercise physiology of the cardiovascular system  and associated critical values. Details cardiovascular disease risk factors and the goals associated with each risk factor.   Aerobic Exercise & Resistance Training: - Gives group verbal and written discussion on the health impact of inactivity. On the components of aerobic and resistive training programs and the benefits of this training and how to safely progress through these programs.   Flexibility, Balance, General Exercise Guidelines: - Provides group verbal and written instruction on the benefits of flexibility and balance training programs. Provides general exercise guidelines with specific guidelines to those with heart or lung disease. Demonstration and skill practice provided.   Stress Management: - Provides group verbal and written instruction about the health risks of elevated stress, cause of high stress, and healthy ways to reduce stress.   Depression: - Provides group verbal and written instruction on the correlation between heart/lung disease and depressed mood, treatment options, and the stigmas associated with seeking treatment.   Exercise & Equipment Safety: - Individual verbal instruction and demonstration of equipment use and safety with use of the equipment.   Infection Prevention: - Provides verbal and written material to individual with discussion of infection control including proper hand washing and proper equipment cleaning during exercise session.   Falls Prevention: - Provides verbal  and written material to individual with discussion of falls prevention and safety.   Diabetes: - Individual verbal and written instruction to review signs/symptoms of diabetes, desired ranges of glucose level fasting, after meals and with exercise. Advice that pre and post exercise glucose checks will be done for 3 sessions at entry of program.   Chronic Lung Diseases: - Group verbal and written instruction to review new updates, new respiratory medications, new  advancements in procedures and treatments. Provide informative websites and "800" numbers of self-education.   Lung Procedures: - Group verbal and written instruction to describe testing methods done to diagnose lung disease. Review the outcome of test results. Describe the treatment choices: Pulmonary Function Tests, ABGs and oximetry.   Energy Conservation: - Provide group verbal and written instruction for methods to conserve energy, plan and organize activities. Instruct on pacing techniques, use of adaptive equipment and posture/positioning to relieve shortness of breath.   Triggers: - Group verbal and written instruction to review types of environmental controls: home humidity, furnaces, filters, dust mite/pet prevention, HEPA vacuums. To discuss weather changes, air quality and the benefits of nasal washing.   Exacerbations: - Group verbal and written instruction to provide: warning signs, infection symptoms, calling MD promptly, preventive modes, and value of vaccinations. Review: effective airway clearance, coughing and/or vibration techniques. Create an Sports administrator.   Oxygen: - Individual and group verbal and written instruction on oxygen therapy. Includes supplement oxygen, available portable oxygen systems, continuous and intermittent flow rates, oxygen safety, concentrators, and Medicare reimbursement for oxygen.   Respiratory Medications: - Group verbal and written instruction to review medications for lung disease. Drug class, frequency, complications, importance of spacers, rinsing mouth after steroid MDI's, and proper cleaning methods for nebulizers.   AED/CPR: - Group verbal and written instruction with the use of models to demonstrate the basic use of the AED with the basic ABC's of resuscitation.   Breathing Retraining: - Provides individuals verbal and written instruction on purpose, frequency, and proper technique of diaphragmatic breathing and pursed-lipped  breathing. Applies individual practice skills.   Anatomy and Physiology of the Lungs: - Group verbal and written instruction with the use of models to provide basic lung anatomy and physiology related to function, structure and complications of lung disease.   Heart Failure: - Group verbal and written instruction on the basics of heart failure: signs/symptoms, treatments, explanation of ejection fraction, enlarged heart and cardiomyopathy.   Sleep Apnea: - Individual verbal and written instruction to review Obstructive Sleep Apnea. Review of risk factors, methods for diagnosing and types of masks and machines for OSA.   Anxiety: - Provides group, verbal and written instruction on the correlation between heart/lung disease and anxiety, treatment options, and management of anxiety.   Relaxation: - Provides group, verbal and written instruction about the benefits of relaxation for patients with heart/lung disease. Also provides patients with examples of relaxation techniques.   Knowledge Questionnaire Score:   Personal Goals and Risk Factors at Admission:     Personal Goals and Risk Factors at Admission - 06/30/14 1506    Personal Goals and Risk Factors on Admission    Weight Management Yes   Intervention Learn and follow the exercise and diet guidelines while in the program. Utilize the nutrition and education classes to help gain knowledge of the diet and exercise expectations in the program   Admit Weight 350 lb (158.759 kg)   Increase Aerobic Exercise and Physical Activity Yes   Intervention While in program, learn and  follow the exercise prescription taught. Start at a low level workload and increase workload after able to maintain previous level for 30 minutes. Increase time before increasing intensity.   Understand more about Heart/Pulmonary Disease. Yes   Intervention While in program utilize professionals for any questions, and attend the education sessions. Great websites  to use are www.americanheart.org or www.lung.org for reliable information.   Improve shortness of breath with ADL's Yes   Intervention While in program, learn and follow the exercise prescription taught. Start at a low level workload and increase workload ad advised by the exercise physiologist. Increase time before increasing intensity.   Develop more efficient breathing techniques such as purse lipped breathing and diaphragmatic breathing; and practicing self-pacing with activity Yes   Intervention While in program, learn and utilize the specific breathing techniques taught to you. Continue to practice and use the techniques as needed.   Hypertension Yes   Goal Participant will see blood pressure controlled within the values of 140/62mm/Hg or within value directed by their physician.   Intervention Provide nutrition & aerobic exercise along with prescribed medications to achieve BP 140/90 or less.   Stress Yes   Goal To meet with psychosocial counselor for stress and relaxation information and guidance. To state understanding of performing relaxation techniques and or identifying personal stressors.   Intervention Provide education on types of stress, identifiying stressors, and ways to cope with stress. Provide demonstration and active practice of relaxation techniques.      Personal Goals and Risk Factors Review:      Goals and Risk Factor Review      06/30/14 1510 07/02/14 1130 07/09/14 1130 07/30/14 1130 08/01/14 1232   Weight Management   Goals Progress/Improvement seen   Yes Yes    Comments   Mr Aliano and his wife have started [employer] which provides them 3 healthy dinner meals a week. New weight management goals - eliminate sweets, avoid sodium, no eating after 8pm,coffee only once a day, and drink only water.    Increase Aerobic Exercise and Physical Activity   Goals Progress/Improvement seen  Yes Yes  Yes Yes   Comments Mid 96mwd - Mr Tokunaga improved by 182ft and he could walk  the entire 6 minutes. Mr Valverde started a new exercise goal on the treadmill. His stamina has improved to walk on the TM.  Mr Hott has set personal goals for exercise for LW - REL 61mins, L5, 35rpm; TM 1.55mph, 0%, 66mins; and T5 L5, 25w, 6mins; he will do balance exercises at home and I encouraged him to walk at least 1-3 26mins intervals in his home. Discussed with Kristin Bruins that instead of increasing him on his levels for the T5 and REL that we would put a focus on him trying to pedal faster and maintain that speed for a higher watts and RPMs average. I explained the benefits of maintaining a higher speed and why we set minimum standards with watts and RPMs.    Understand more about Heart/Pulmonary Disease   Goals Progress/Improvement seen     Yes    Comments    Mr Foister attends education sessions.    Improve shortness of breath with ADL's   Goals Progress/Improvement seen  Yes       Comments Mr Reale improved his UCSD Shortness of breath scores by 12 points and states he has less shortness of breath with ADL.         08/01/14 1509  Weight Management   Goals Progress/Improvement seen Yes       Comments Mr. Allnutt has lost >20 lbs. since starting Pulmonary Rehabilation       Improve shortness of breath with ADL's   Goals Progress/Improvement seen  Yes       Comments Mr. Brueckner says that he is able to walk longer distances without stopping as much.  He aslo states that it is easier to get in and out of his car.          Personal Goals Discharge:    Comments: 30 day review

## 2014-08-04 NOTE — Progress Notes (Signed)
Daily Session Note  Patient Details  Name: Daniel Eaton MRN: [id number] Date of Birth: 06-05-1940 Referring Provider:  [name], MD  Encounter Date: 08/04/2014  Check In:     Session Check In - 08/04/14 1220    Check-In   Staff Present Laureen Owens Shark BS, RRT, Respiratory Therapist;Carroll Enterkin RN, BSN;Steven Way BS, ACSM EP-C, Exercise Physiologist   ER physicians immediately available to respond to emergencies [facility] immediately available ER MD   Physician(s) Dr. Joni Fears and Dr. Corky Downs   Medication changes reported     No   Fall or balance concerns reported    No   Warm-up and Cool-down Performed on first and last piece of equipment   VAD Patient? No   Pain Assessment   Currently in Pain? No/denies           Exercise Prescription Changes - 08/04/14 1200    Exercise Review   Progression Yes   Response to Exercise   Blood Pressure (Admit) 120/80 mmHg   Blood Pressure (Exercise) 132/82 mmHg   Blood Pressure (Exit) 118/70 mmHg   Heart Rate (Admit) 78 bpm   Heart Rate (Exercise) 112 bpm   Heart Rate (Exit) 72 bpm   Oxygen Saturation (Admit) 97 %   Oxygen Saturation (Exercise) 97 %   Oxygen Saturation (Exit) 96 %   Rating of Perceived Exertion (Exercise) 15   Perceived Dyspnea (Exercise) 3   Progression Continue progressive overload as per policy without signs/symptoms or physical distress.   Recumbant Elliptical   Level 4   Minutes 15      Goals Met:  Proper associated with RPD/PD & O2 Sat Independence with exercise equipment Using PLB without cueing & demonstrates good technique Exercise tolerated well Strength training completed today  Goals Unmet:  Not Applicable  Goals Comments: [name] is wearing support hose - 20/30, but they are very difficult to get on. He continues to loss weight - new weight 327.7; pre weight was 350!   Dr. Emily Filbert is Medical Director for [place] and [facility].

## 2014-08-04 NOTE — Progress Notes (Signed)
Daily Session Note  Patient Details  Name: Daniel Eaton MRN: [id number] Date of Birth: 12-28-1940 Referring Provider:  [name], MD  Encounter Date: 08/04/2014  Check In:     Session Check In - 08/04/14 1220    Check-In   Staff Present Laureen Owens Shark BS, RRT, Respiratory Therapist;Carzell Saldivar RN, BSN;Steven Way BS, ACSM EP-C, Exercise Physiologist   ER physicians immediately available to respond to emergencies [facility] immediately available ER MD   Physician(s) Dr. Joni Fears and Dr. Corky Downs   Medication changes reported     No   Fall or balance concerns reported    No   Warm-up and Cool-down Performed on first and last piece of equipment   VAD Patient? No   Pain Assessment   Currently in Pain? No/denies         Goals Met:  Proper associated with RPD/PD & O2 Sat Exercise tolerated well  Goals Unmet:  Not Applicable  Goals Comments:    Dr. Emily Filbert is Medical Director for [place] and [facility].

## 2014-08-06 DIAGNOSIS — J449 Chronic obstructive pulmonary disease, unspecified: Secondary | ICD-10-CM | POA: Diagnosis not present

## 2014-08-06 NOTE — Progress Notes (Signed)
Daily Session Note  Patient Details  Name: Daniel Eaton MRN: [id number] Date of Birth: 11-Nov-1940 Referring Provider:  [name], MD  Encounter Date: 08/06/2014  Check In:     Session Check In - 08/06/14 1201    Check-In   Staff Present Lestine Box BS, ACSM EP-C, Exercise Physiologist;Renee Dillard Essex MS, ACSM CEP Exercise Physiologist;Laureen Janell Quiet, RRT, Respiratory Therapist   ER physicians immediately available to respond to emergencies [facility] immediately available ER MD   Medication changes reported     No   Fall or balance concerns reported    No   Warm-up and Cool-down Performed on first and last piece of equipment   VAD Patient? No   Pain Assessment   Currently in Pain? No/denies         Goals Met:  Proper associated with RPD/PD & O2 Sat Exercise tolerated well No report of cardiac concerns or symptoms Strength training completed today  Goals Unmet:  Not Applicable  Goals Comments:    Dr. Emily Filbert is Medical Director for [place] and [facility].

## 2014-08-08 ENCOUNTER — Encounter: Payer: PPO | Admitting: *Deleted

## 2014-08-08 DIAGNOSIS — J410 Simple chronic bronchitis: Secondary | ICD-10-CM

## 2014-08-08 DIAGNOSIS — J449 Chronic obstructive pulmonary disease, unspecified: Secondary | ICD-10-CM | POA: Diagnosis not present

## 2014-08-08 NOTE — Progress Notes (Signed)
Daily Session Note  Patient Details  Name: Daniel Eaton MRN: [id number] Date of Birth: 1940-03-30 Referring Provider:  [name], MD  Encounter Date: 08/08/2014  Check In:     Session Check In - 08/08/14 1158    Check-In   Staff Present Frederich Cha RRT, RCP Respiratory Therapist;Castiel Lauricella Dillard Essex MS, ACSM CEP Exercise Physiologist;Carroll Enterkin RN, BSN   ER physicians immediately available to respond to emergencies [facility] immediately available ER MD   Physician(s) Archie Balboa and Corky Downs   Medication changes reported     No   Fall or balance concerns reported    No   Warm-up and Cool-down Performed on first and last piece of equipment   VAD Patient? No   Pain Assessment   Currently in Pain? No/denies   Multiple Pain Sites No         Goals Met:  Independence with exercise equipment Using PLB without cueing & demonstrates good technique Exercise tolerated well Personal goals reviewed Strength training completed today  Goals Unmet:  Not Applicable  Goals Comments:   Dr. Emily Filbert is Medical Director for [place] and [facility].

## 2014-08-11 ENCOUNTER — Encounter: Payer: PPO | Admitting: *Deleted

## 2014-08-11 DIAGNOSIS — J449 Chronic obstructive pulmonary disease, unspecified: Secondary | ICD-10-CM | POA: Diagnosis not present

## 2014-08-11 NOTE — Progress Notes (Signed)
Daily Session Note  Patient Details  Name: CORREY WEIDNER MRN: [id number] Date of Birth: 1940/03/16 Referring Provider:  [name], MD  Encounter Date: 08/11/2014  Check In:     Session Check In - 08/11/14 1215    Check-In   Staff Present Carson Myrtle BS, RRT, Respiratory Therapist;Carroll Enterkin RN, BSN;Steven Way BS, ACSM EP-C, Exercise Physiologist   ER physicians immediately available to respond to emergencies [facility] immediately available ER MD   Physician(s) Dr. Edd Fabian and Dr. Reita Cliche   Medication changes reported     No   Fall or balance concerns reported    No   Warm-up and Cool-down Performed on first and last piece of equipment   VAD Patient? No   Pain Assessment   Currently in Pain? No/denies           Exercise Prescription Changes - 08/11/14 1200    Exercise Review   Progression Yes   Response to Exercise   Blood Pressure (Admit) 122/78 mmHg   Blood Pressure (Exercise) 130/84 mmHg   Blood Pressure (Exit) 120/84 mmHg   Heart Rate (Admit) 106 bpm   Heart Rate (Exercise) 95 bpm   Heart Rate (Exit) 79 bpm   Oxygen Saturation (Admit) 96 %   Oxygen Saturation (Exercise) 96 %   Oxygen Saturation (Exit) 96 %   Rating of Perceived Exertion (Exercise) 14   Perceived Dyspnea (Exercise) 4   Intensity THRR unchanged   Progression Continue progressive overload as per policy without signs/symptoms or physical distress.   Resistance Training   Training Prescription Yes   Weight 3   Reps 10-12   Treadmill   MPH 1   Grade 0   Minutes 10   NuStep   Level 4   Watts 30   Minutes 10   Recumbant Elliptical   Level 3   RPM 35   Minutes 15      Goals Met:  Proper associated with RPD/PD & O2 Sat Independence with exercise equipment Using PLB without cueing & demonstrates good technique Exercise tolerated well Strength training completed today  Goals Unmet:  Not Applicable  Goals Comments: Mr Steyer plans to return to [facility] 2:30pm class.  There is a  wait list, so he may exercise at [employer] where is wife attends.   Dr. Emily Filbert is Medical Director for [facility] and [facility].

## 2014-08-11 NOTE — Progress Notes (Signed)
Daily Session Note  Patient Details  Name: Daniel Eaton MRN: [id number] Date of Birth: 12-20-1940 Referring Provider:  [name], MD  Encounter Date: 08/11/2014  Check In:     Session Check In - 08/11/14 1215    Check-In   Staff Present Carson Myrtle BS, RRT, Respiratory Therapist;[name] Wixted RN, BSN;Steven Way BS, ACSM EP-C, Exercise Physiologist   ER physicians immediately available to respond to emergencies [facility] immediately available ER MD   Physician(s) Dr. Edd Fabian and Dr. Reita Cliche   Medication changes reported     No   Fall or balance concerns reported    No   Warm-up and Cool-down Performed on first and last piece of equipment   VAD Patient? No   Pain Assessment   Currently in Pain? No/denies           Exercise Prescription Changes - 08/11/14 1200    Exercise Review   Progression Yes   Response to Exercise   Blood Pressure (Admit) 122/78 mmHg   Blood Pressure (Exercise) 130/84 mmHg   Blood Pressure (Exit) 120/84 mmHg   Heart Rate (Admit) 106 bpm   Heart Rate (Exercise) 95 bpm   Heart Rate (Exit) 79 bpm   Oxygen Saturation (Admit) 96 %   Oxygen Saturation (Exercise) 96 %   Oxygen Saturation (Exit) 96 %   Rating of Perceived Exertion (Exercise) 14   Perceived Dyspnea (Exercise) 4   Intensity THRR unchanged   Progression Continue progressive overload as per policy without signs/symptoms or physical distress.   Resistance Training   Training Prescription Yes   Weight 3   Reps 10-12   Treadmill   MPH 1   Grade 0   Minutes 10   NuStep   Level 4   Watts 30   Minutes 10   Recumbant Elliptical   Level 3   RPM 35   Minutes 15      Goals Met:  Proper associated with RPD/PD & O2 Sat Exercise tolerated well  Goals Unmet:  Not Applicable  Goals Comments:    Dr. Emily Filbert is Medical Director for [facility] and [facility].

## 2014-08-15 ENCOUNTER — Encounter: Payer: PPO | Attending: Internal Medicine | Admitting: Respiratory Therapy

## 2014-08-15 VITALS — Wt 328.3 lb

## 2014-08-15 DIAGNOSIS — J449 Chronic obstructive pulmonary disease, unspecified: Secondary | ICD-10-CM | POA: Insufficient documentation

## 2014-08-15 DIAGNOSIS — J42 Unspecified chronic bronchitis: Secondary | ICD-10-CM | POA: Insufficient documentation

## 2014-08-15 NOTE — Progress Notes (Signed)
Daily Session Note  Patient Details  Name: STATON MARKEY MRN: [id number] Date of Birth: 04/20/1940 Referring Provider:  [name], MD  Encounter Date: 08/15/2014  Check In:      Goals Met:  Proper associated with RPD/PD & O2 Sat Independence with exercise equipment Achieving weight loss Changing diet to healthy choices, watchign portion sizes Exercise tolerated well Personal goals reviewed Mr. Overfelt left early for a doctors appointment.  No weights performed today.  Goals Unmet:  Not Applicable  Goals Comments: Post 6 minute walk performed today.   Dr. Emily Filbert is Medical Director for [facility] and [facility].

## 2014-08-19 ENCOUNTER — Encounter: Payer: Self-pay | Admitting: Internal Medicine

## 2014-08-19 NOTE — Progress Notes (Unsigned)
Discharge Summary  Patient Details  Name: Daniel Eaton MRN: [id number] Date of Birth: 1940/12/02 Referring Provider:  Dr Frazier Richards   Number of Visits: 36  Reason for Discharge:  Patient reached a stable level of exercise. Patient independent in their exercise.  Smoking History:  History  Smoking status   Not on file  Smokeless tobacco   Not on file    Diagnosis:  No diagnosis found.  ADL [facility]:     ADL [facility]      05/06/14 0900 06/30/14 1130 08/15/14 1130   ADL [facility]   ADL Phase Entry Mid Exit   SOB Score total 91 79 68   Rest 1 0 1   Walk 1 0 2   Stairs 5 5 5    Bath 3 2 1    Dress 2 2 1    Shop 5 5 5       Initial Exercise Prescription:   Discharge Exercise Prescription (Final Exercise Prescription Changes):     Exercise Prescription Changes - 08/11/14 1200    Exercise Review   Progression Yes   Response to Exercise   Blood Pressure (Admit) 122/78 mmHg   Blood Pressure (Exercise) 130/84 mmHg   Blood Pressure (Exit) 120/84 mmHg   Heart Rate (Admit) 106 bpm   Heart Rate (Exercise) 95 bpm   Heart Rate (Exit) 79 bpm   Oxygen Saturation (Admit) 96 %   Oxygen Saturation (Exercise) 96 %   Oxygen Saturation (Exit) 96 %   Rating of Perceived Exertion (Exercise) 14   Perceived Dyspnea (Exercise) 4   Intensity THRR unchanged   Progression Continue progressive overload as per policy without signs/symptoms or physical distress.   Resistance Training   Training Prescription Yes   Weight 3   Reps 10-12   Treadmill   MPH 1   Grade 0   Minutes 10   NuStep   Level 4   Watts 30   Minutes 10   Recumbant Elliptical   Level 3   RPM 35   Minutes 15      Functional Capacity:     6 Minute Walk      05/06/14 0900 06/30/14 1130 08/15/14 1241   6 Minute Walk   Phase  Mid Program Discharge   Distance 415 feet 600 feet 620 feet   Walk Time 4.3 minutes 6 minutes 6 minutes   Resting HR 83 bpm 83 bpm 75 bpm   Resting BP 132/80 mmHg 122/80 mmHg 122/84  mmHg   Max Ex. HR 106 bpm 92 bpm 81 bpm   Max Ex. BP 132/82 mmHg 164/82 mmHg 138/80 mmHg   RPE 15 15 17    Perceived Dyspnea  5 5 5    Symptoms   No      Psychological, QOL, Others - Outcomes: PHQ 2/9:  GD04 Depression Questionnaire Results      Pre 20    Post 5  SF36                                                                   Pre           Post             Physical Fx     12  12                Role Fx: Physical    6   6              Bodily Pain     8   10    General Health    15               16   Vitality                  10               9    Social Fx     5                5   Role Fx: Emotional    3               6   Mental Health    19    27   Quality of Life:   Personal Goals: Goals established at orientation with interventions provided to work toward goal.     Personal Goals and Risk Factors at Admission - 06/30/14 1506    Personal Goals and Risk Factors on Admission    Weight Management Yes   Intervention Learn and follow the exercise and diet guidelines while in the program. Utilize the nutrition and education classes to help gain knowledge of the diet and exercise expectations in the program   Admit Weight 350 lb (158.759 kg)   Increase Aerobic Exercise and Physical Activity Yes   Intervention While in program, learn and follow the exercise prescription taught. Start at a low level workload and increase workload after able to maintain previous level for 30 minutes. Increase time before increasing intensity.   Understand more about Heart/Pulmonary Disease. Yes   Intervention While in program utilize professionals for any questions, and attend the education sessions. Great websites to use are www.americanheart.org or www.lung.org for reliable information.   Improve shortness of breath with ADL's Yes   Intervention While in program, learn and follow the exercise prescription taught. Start at a low level workload and increase workload ad advised by the  exercise physiologist. Increase time before increasing intensity.   Develop more efficient breathing techniques such as purse lipped breathing and diaphragmatic breathing; and practicing self-pacing with activity Yes   Intervention While in program, learn and utilize the specific breathing techniques taught to you. Continue to practice and use the techniques as needed.   Hypertension Yes   Goal Participant will see blood pressure controlled within the values of 140/105mm/Hg or within value directed by their physician.   Intervention Provide nutrition & aerobic exercise along with prescribed medications to achieve BP 140/90 or less.   Stress Yes   Goal To meet with psychosocial counselor for stress and relaxation information and guidance. To state understanding of performing relaxation techniques and or identifying personal stressors.   Intervention Provide education on types of stress, identifiying stressors, and ways to cope with stress. Provide demonstration and active practice of relaxation techniques.       Personal Goals Discharge:     Personal Goals at Discharge - 08/19/14 0740    Weight Management   Comments Mr Catalfamo has lost 22lbs during [facility] - a combination of exercise and diet which includes Fresh Foods, no salt, better choices with eating out, elimination of sweets, drink only water, and no eating between 8pm and 7am.   Increase  Aerobic Exercise and Physical Activity   Goals Progress/Improvement seen  Yes   Comments Mr Judson has increased his exercise goals on the TM, REL, and T5 and has increased resistive weights to 3lbs. His post 36mwd improved by 225ft. He plans to continue exercising in [employer].   Understand more about Heart/Pulmonary Disease   Goals Progress/Improvement seen  Yes   Comments Mr Canelo has attended [facility] education classes and states he has learned disease management form them.   Improve shortness of breath with ADL's   Goals  Progress/Improvement seen  Yes   Comments Mr Pezzullo's [facility] Shortness of Breath Questionnaire improved by 23points - Minimal Importance Difference is 5 points.   Breathing Techniques   Goals Progress/Improvement seen  Yes   Comments Mr Perrott uses PLB during his exercise in [facility] and with activities at home, especially his daily walking.      Nutrition & Weight - Outcomes:      Post Biometrics - 08/15/14 1239     Post  Biometrics   Weight (!) 328 lb 4.8 oz (148.916 kg)   Waist Circumference 54.25 inches   Hip Circumference 63 inches   Waist to Hip Ratio 0.86 %      Nutrition:   Nutrition Discharge:   Education Questionnaire Score:   Thank you for the opportunity to work and care for your patient, Dheeraj Hail.

## 2014-08-19 NOTE — Progress Notes (Signed)
Pulmonary Individual Treatment Plan  Patient Details  Name: Daniel Eaton MRN: [id number] Date of Birth: [birth year] Referring Provider:  Dr Frazier Richards  Initial Encounter Date: 05/06/2014  Visit Diagnosis: Morbid Obesity  Patient's Home Medications on Admission:  Current outpatient prescriptions:    amiodarone (PACERONE) 200 MG tablet, Take by mouth., Disp: , Rfl:    dabigatran (PRADAXA) 150 MG CAPS capsule, Take by mouth., Disp: , Rfl:    diphenhydrAMINE (BENADRYL) 25 mg capsule, Take by mouth., Disp: , Rfl:    levothyroxine (SYNTHROID, LEVOTHROID) 75 MCG tablet, Take by mouth., Disp: , Rfl:    metolazone (ZAROXOLYN) 2.5 MG tablet, Take by mouth., Disp: , Rfl:    metoprolol (LOPRESSOR) 100 MG tablet, Take by mouth., Disp: , Rfl:    Multiple Vitamin (MULTI-VITAMINS) TABS, Take by mouth., Disp: , Rfl:    omeprazole (PRILOSEC) 40 MG capsule, Take by mouth., Disp: , Rfl:    spironolactone (ALDACTONE) 25 MG tablet, Take by mouth., Disp: , Rfl:    torsemide (DEMADEX) 20 MG tablet, Take by mouth., Disp: , Rfl:   Past Medical History: No past medical history on file.  Tobacco Use: History  Smoking status   Not on file  Smokeless tobacco   Not on file    Labs:     Recent Review Flowsheet Data    There is no flowsheet data to display.       ADL [facility]:     ADL [facility]      05/06/14 0900 06/30/14 1130 08/15/14 1130   ADL [facility]   ADL Phase Entry Mid Exit   SOB Score total 91 79 68   Rest 1 0 1   Walk 1 0 2   Stairs 5 5 5    Bath 3 2 1    Dress 2 2 1    Shop 5 5 5        Pulmonary Function Assessment:   Exercise Target Goals:    Exercise Program Goal: Individual exercise prescription set with THRR, safety & activity barriers. Participant demonstrates ability to understand and report RPE using BORG scale, to self-measure pulse accurately, and to acknowledge the importance of the exercise prescription.  Exercise Prescription Goal: Starting with aerobic  activity 30 plus minutes a day, 3 days per week for initial exercise prescription. Provide home exercise prescription and guidelines that participant acknowledges understanding prior to discharge.  Activity Barriers & Risk Stratification:   6 Minute Walk:     6 Minute Walk      05/06/14 0900 06/30/14 1130 08/15/14 1241   6 Minute Walk   Phase  Mid Program Discharge   Distance 415 feet 600 feet 620 feet   Walk Time 4.3 minutes 6 minutes 6 minutes   Resting HR 83 bpm 83 bpm 75 bpm   Resting BP 132/80 mmHg 122/80 mmHg 122/84 mmHg   Max Ex. HR 106 bpm 92 bpm 81 bpm   Max Ex. BP 132/82 mmHg 164/82 mmHg 138/80 mmHg   RPE 15 15 17    Perceived Dyspnea  5 5 5    Symptoms   No      Initial Exercise Prescription:   Exercise Prescription Changes:      Exercise Prescription Changes      07/02/14 1500 07/07/14 1000 07/07/14 1400 07/28/14 1200 07/30/14 1400   Exercise Review   Progression Yes Yes Yes Yes Yes   Response to Exercise   Blood Pressure (Admit)  122/78 mmHg 122/78 mmHg  120/80 mmHg   Blood Pressure (Exercise)  130/84 mmHg 130/84 mmHg  132/82 mmHg   Blood Pressure (Exit)  120/84 mmHg 120/84 mmHg  118/70 mmHg   Heart Rate (Admit)   106 bpm  78 bpm   Heart Rate (Exercise)   95 bpm  112 bpm   Heart Rate (Exit)   79 bpm  72 bpm   Oxygen Saturation (Admit)   96 %  97 %   Oxygen Saturation (Exercise)   96 %  97 %   Oxygen Saturation (Exit)   96 %  96 %   Rating of Perceived Exertion (Exercise)   14  15   Perceived Dyspnea (Exercise)   4  3   Intensity  THRR unchanged THRR unchanged     Progression  Continue progressive overload as per policy without signs/symptoms or physical distress. Continue progressive overload as per policy without signs/symptoms or physical distress.     Resistance Training   Training Prescription  Yes Yes     Weight  3 3     Reps  10-12 10-12     Treadmill   MPH 0.7 0.7 0.7     Grade 0 0 0     Minutes 10 10 10      NuStep   Level  3 3 4   T5 Nu  step    Watts  25 25 30     Minutes  10 10     Recumbant Elliptical   Level  3 3     RPM  35 35     Minutes  15 15       08/04/14 1200 08/08/14 1200 08/11/14 1200       Exercise Review   Progression Yes Yes Yes     Response to Exercise   Blood Pressure (Admit) 120/80 mmHg 122/78 mmHg 122/78 mmHg     Blood Pressure (Exercise) 132/82 mmHg 130/84 mmHg 130/84 mmHg     Blood Pressure (Exit) 118/70 mmHg 120/84 mmHg 120/84 mmHg     Heart Rate (Admit) 78 bpm 106 bpm 106 bpm     Heart Rate (Exercise) 112 bpm 95 bpm 95 bpm     Heart Rate (Exit) 72 bpm 79 bpm 79 bpm     Oxygen Saturation (Admit) 97 % 96 % 96 %     Oxygen Saturation (Exercise) 97 % 96 % 96 %     Oxygen Saturation (Exit) 96 % 96 % 96 %     Rating of Perceived Exertion (Exercise) 15 14 14      Perceived Dyspnea (Exercise) 3 4 4      Intensity  THRR unchanged THRR unchanged     Progression Continue progressive overload as per policy without signs/symptoms or physical distress. Continue progressive overload as per policy without signs/symptoms or physical distress. Continue progressive overload as per policy without signs/symptoms or physical distress.     Resistance Training   Training Prescription  Yes Yes     Weight  3 3     Reps  10-12 10-12     Treadmill   MPH  0.9 1     Grade  0 0     Minutes  10 10     NuStep   Level  4 4     Watts  30 30     Minutes  10 10     Recumbant Elliptical   Level 4 3 3      RPM  35 35     Minutes 15 15 15  Discharge Exercise Prescription (Final Exercise Prescription Changes):     Exercise Prescription Changes - 08/11/14 1200    Exercise Review   Progression Yes   Response to Exercise   Blood Pressure (Admit) 122/78 mmHg   Blood Pressure (Exercise) 130/84 mmHg   Blood Pressure (Exit) 120/84 mmHg   Heart Rate (Admit) 106 bpm   Heart Rate (Exercise) 95 bpm   Heart Rate (Exit) 79 bpm   Oxygen Saturation (Admit) 96 %   Oxygen Saturation (Exercise) 96 %   Oxygen Saturation  (Exit) 96 %   Rating of Perceived Exertion (Exercise) 14   Perceived Dyspnea (Exercise) 4   Intensity THRR unchanged   Progression Continue progressive overload as per policy without signs/symptoms or physical distress.   Resistance Training   Training Prescription Yes   Weight 3   Reps 10-12   Treadmill   MPH 1   Grade 0   Minutes 10   NuStep   Level 4   Watts 30   Minutes 10   Recumbant Elliptical   Level 3   RPM 35   Minutes 15       Nutrition:  Target Goals: Understanding of nutrition guidelines, daily intake of sodium 1500mg , cholesterol 200mg , calories 30% from fat and 7% or less from saturated fats, daily to have 5 or more servings of fruits and vegetables.  Biometrics:      Post Biometrics - 08/15/14 1239     Post  Biometrics   Weight (!) 328 lb 4.8 oz (148.916 kg)   Waist Circumference 54.25 inches   Hip Circumference 63 inches   Waist to Hip Ratio 0.86 %      Nutrition Therapy Plan and Nutrition Goals:   Nutrition Discharge: Rate Your Plate Scores:   Psychosocial: Target Goals: Acknowledge presence or absence of depression, maximize coping skills, provide positive support system. Participant is able to verbalize types and ability to use techniques and skills needed for reducing stress and depression.  Initial Review & Psychosocial Screening:   Quality of Life Scores:    GD04 Depression Questionnaire Results      Pre 20    Post 5  SF36                                                                   Pre           Post             Physical Fx     12        12                Role Fx: Physical    6  6              Bodily Pain     8              10    General Health    15                     16   Vitality                  10  9    Social Fx     5  5     Role Fx: Emotional    3  6   Mental Health               19                    27    PHQ-9:     Recent Review Flowsheet Data    There is no flowsheet data to  display.      Psychosocial Evaluation and Intervention:   Psychosocial Re-Evaluation:     Psychosocial Re-Evaluation      06/23/14 1603           Psychosocial Re-Evaluation   Interventions Therapist referral       Comments --  Follow up with Mr. L reporting finally connected with therapist recommended by counselor and has an appointment for this Thursday 5/12.  Mr. Carlean Jews was pleased about this and looking forward to this session.  Counselor will continue to follow as needed.          Education: Education Goals: Education classes will be provided on a weekly basis, covering required topics. Participant will state understanding/return demonstration of topics presented.  Learning Barriers/Preferences:   Education Topics: Initial Evaluation Education: - Verbal, written and demonstration of respiratory meds, RPE/PD scales, oximetry and breathing techniques. Instruction on use of nebulizers and MDIs: cleaning and proper use, rinsing mouth with steroid doses and importance of monitoring MDI activations.   General Nutrition Guidelines/Fats and Fiber: -Group instruction provided by verbal, written material, models and posters to present the general guidelines for heart healthy nutrition. Gives an explanation and review of dietary fats and fiber.   Controlling Sodium/Reading Food Labels: -Group verbal and written material supporting the discussion of sodium use in heart healthy nutrition. Review and explanation with models, verbal and written materials for utilization of the food label.   Exercise Physiology & Risk Factors: - Group verbal and written instruction with models to review the exercise physiology of the cardiovascular system and associated critical values. Details cardiovascular disease risk factors and the goals associated with each risk factor.   Aerobic Exercise & Resistance Training: - Gives group verbal and written discussion on the health impact of inactivity. On the  components of aerobic and resistive training programs and the benefits of this training and how to safely progress through these programs.   Flexibility, Balance, General Exercise Guidelines: - Provides group verbal and written instruction on the benefits of flexibility and balance training programs. Provides general exercise guidelines with specific guidelines to those with heart or lung disease. Demonstration and skill practice provided.   Stress Management: - Provides group verbal and written instruction about the health risks of elevated stress, cause of high stress, and healthy ways to reduce stress.   Depression: - Provides group verbal and written instruction on the correlation between heart/lung disease and depressed mood, treatment options, and the stigmas associated with seeking treatment.   Exercise & Equipment Safety: - Individual verbal instruction and demonstration of equipment use and safety with use of the equipment.   Infection Prevention: - Provides verbal and written material to individual with discussion of infection control including proper hand washing and proper equipment cleaning during exercise session.   Falls Prevention: - Provides verbal and written material to individual with discussion of falls prevention and safety.   Diabetes: - Individual verbal and written instruction to review signs/symptoms of diabetes, desired ranges of glucose level fasting, after meals and with exercise. Advice that pre  and post exercise glucose checks will be done for 3 sessions at entry of program.   Chronic Lung Diseases: - Group verbal and written instruction to review new updates, new respiratory medications, new advancements in procedures and treatments. Provide informative websites and "800" numbers of self-education.   Lung Procedures: - Group verbal and written instruction to describe testing methods done to diagnose lung disease. Review the outcome of test results.  Describe the treatment choices: Pulmonary Function Tests, ABGs and oximetry.   Energy Conservation: - Provide group verbal and written instruction for methods to conserve energy, plan and organize activities. Instruct on pacing techniques, use of adaptive equipment and posture/positioning to relieve shortness of breath.   Triggers: - Group verbal and written instruction to review types of environmental controls: home humidity, furnaces, filters, dust mite/pet prevention, HEPA vacuums. To discuss weather changes, air quality and the benefits of nasal washing.   Exacerbations: - Group verbal and written instruction to provide: warning signs, infection symptoms, calling MD promptly, preventive modes, and value of vaccinations. Review: effective airway clearance, coughing and/or vibration techniques. Create an Sports administrator.   Oxygen: - Individual and group verbal and written instruction on oxygen therapy. Includes supplement oxygen, available portable oxygen systems, continuous and intermittent flow rates, oxygen safety, concentrators, and Medicare reimbursement for oxygen.   Respiratory Medications: - Group verbal and written instruction to review medications for lung disease. Drug class, frequency, complications, importance of spacers, rinsing mouth after steroid MDI's, and proper cleaning methods for nebulizers.   AED/CPR: - Group verbal and written instruction with the use of models to demonstrate the basic use of the AED with the basic ABC's of resuscitation.   Breathing Retraining: - Provides individuals verbal and written instruction on purpose, frequency, and proper technique of diaphragmatic breathing and pursed-lipped breathing. Applies individual practice skills.   Anatomy and Physiology of the Lungs: - Group verbal and written instruction with the use of models to provide basic lung anatomy and physiology related to function, structure and complications of lung  disease.   Heart Failure: - Group verbal and written instruction on the basics of heart failure: signs/symptoms, treatments, explanation of ejection fraction, enlarged heart and cardiomyopathy.   Sleep Apnea: - Individual verbal and written instruction to review Obstructive Sleep Apnea. Review of risk factors, methods for diagnosing and types of masks and machines for OSA.   Anxiety: - Provides group, verbal and written instruction on the correlation between heart/lung disease and anxiety, treatment options, and management of anxiety.   Relaxation: - Provides group, verbal and written instruction about the benefits of relaxation for patients with heart/lung disease. Also provides patients with examples of relaxation techniques.   Knowledge Questionnaire Score:   Personal Goals and Risk Factors at Admission:     Personal Goals and Risk Factors at Admission - 06/30/14 1506    Personal Goals and Risk Factors on Admission    Weight Management Yes   Intervention Learn and follow the exercise and diet guidelines while in the program. Utilize the nutrition and education classes to help gain knowledge of the diet and exercise expectations in the program   Admit Weight 350 lb (158.759 kg)   Increase Aerobic Exercise and Physical Activity Yes   Intervention While in program, learn and follow the exercise prescription taught. Start at a low level workload and increase workload after able to maintain previous level for 30 minutes. Increase time before increasing intensity.   Understand more about Heart/Pulmonary Disease. Yes   Intervention While  in program utilize professionals for any questions, and attend the education sessions. Great websites to use are www.americanheart.org or www.lung.org for reliable information.   Improve shortness of breath with ADL's Yes   Intervention While in program, learn and follow the exercise prescription taught. Start at a low level workload and increase  workload ad advised by the exercise physiologist. Increase time before increasing intensity.   Develop more efficient breathing techniques such as purse lipped breathing and diaphragmatic breathing; and practicing self-pacing with activity Yes   Intervention While in program, learn and utilize the specific breathing techniques taught to you. Continue to practice and use the techniques as needed.   Hypertension Yes   Goal Participant will see blood pressure controlled within the values of 140/31mm/Hg or within value directed by their physician.   Intervention Provide nutrition & aerobic exercise along with prescribed medications to achieve BP 140/90 or less.   Stress Yes   Goal To meet with psychosocial counselor for stress and relaxation information and guidance. To state understanding of performing relaxation techniques and or identifying personal stressors.   Intervention Provide education on types of stress, identifiying stressors, and ways to cope with stress. Provide demonstration and active practice of relaxation techniques.      Personal Goals and Risk Factors Review:      Goals and Risk Factor Review      06/30/14 1510 07/02/14 1130 07/09/14 1130 07/30/14 1130 08/01/14 1232   Weight Management   Goals Progress/Improvement seen   Yes Yes    Comments   Mr Esco and his wife have started [employer] which provides them 3 healthy dinner meals a week. New weight management goals - eliminate sweets, avoid sodium, no eating after 8pm,coffee only once a day, and drink only water.    Increase Aerobic Exercise and Physical Activity   Goals Progress/Improvement seen  Yes Yes  Yes Yes   Comments Mid 16mwd - Mr Koepp improved by 142ft and he could walk the entire 6 minutes. Mr Kawabata started a new exercise goal on the treadmill. His stamina has improved to walk on the TM.  Mr Mayol has set personal goals for exercise for LW - REL 14mins, L5, 35rpm; TM 1.10mph, 0%, 67mins; and T5 L5, 25w,  63mins; he will do balance exercises at home and I encouraged him to walk at least 1-3 40mins intervals in his home. Discussed with Kristin Bruins that instead of increasing him on his levels for the T5 and REL that we would put a focus on him trying to pedal faster and maintain that speed for a higher watts and RPMs average. I explained the benefits of maintaining a higher speed and why we set minimum standards with watts and RPMs.    Understand more about Heart/Pulmonary Disease   Goals Progress/Improvement seen     Yes    Comments    Mr Garlow attends education sessions.    Improve shortness of breath with ADL's   Goals Progress/Improvement seen  Yes       Comments Mr Lovings improved his [facility] Shortness of breath scores by 12 points and states he has less shortness of breath with ADL.         08/01/14 1509 08/06/14 1130 08/06/14 1530       Weight Management   Goals Progress/Improvement seen Yes  Yes     Comments Mr. Denning has lost >20 lbs. since starting Pulmonary Rehabilation  21lbs lost since starting LW!     Increase Aerobic  Exercise and Physical Activity   Goals Progress/Improvement seen   Yes      Comments  Mr Borges reached a personal goal on the treadmill today - 1.0 for 54mins and is working toward another goal on the T5 to 31mins - today he went 86mins.      Improve shortness of breath with ADL's   Goals Progress/Improvement seen  Yes       Comments Mr. Hemphill says that he is able to walk longer distances without stopping as much.  He aslo states that it is easier to get in and out of his car.          Personal Goals Discharge:      Personal Goals at Discharge - 08/19/14 0740    Weight Management   Comments Mr Lacko has lost 22lbs during [facility] - a combination of exercise and diet which includes [employer], no salt, better choices with eating out, elimination of sweets, drink only water, and no eating between 8pm and 7am.   Increase Aerobic Exercise and Physical Activity    Goals Progress/Improvement seen  Yes   Comments Mr Pavon has increased his exercise goals on the TM, REL, and T5 and has increased resistive weights to 3lbs. His post 12mwd improved by 276ft. He plans to continue exercising in [employer].   Understand more about Heart/Pulmonary Disease   Goals Progress/Improvement seen  Yes   Comments Mr Bissonnette has attended [facility] education classes and states he has learned disease management form them.   Improve shortness of breath with ADL's   Goals Progress/Improvement seen  Yes   Comments Mr Manzer's [facility] Shortness of Breath Questionnaire improved by 23points - Minimal Importance Difference is 5 points.   Breathing Techniques   Goals Progress/Improvement seen  Yes   Comments Mr Valdes uses PLB during his exercise in [facility] and with activities at home, especially his daily walking.      Comments: Mr Servellon plans to continue exercise in [employer].

## 2014-08-19 NOTE — Patient Instructions (Signed)
p Discharge Instructions  Patient Details  Name: Daniel Eaton MRN: [id number] Date of Birth: [birth year] Referring Provider:  Dr Frazier Richards   Number of Visits: 36  Reason for Discharge:  Patient reached a stable level of exercise. Patient independent in their exercise.  Smoking History:  History  Smoking status   Not on file  Smokeless tobacco   Not on file    Diagnosis:  No diagnosis found.  Initial Exercise Prescription:   Discharge Exercise Prescription (Final Exercise Prescription Changes):     Exercise Prescription Changes - 08/11/14 1200    Exercise Review   Progression Yes   Response to Exercise   Blood Pressure (Admit) 122/78 mmHg   Blood Pressure (Exercise) 130/84 mmHg   Blood Pressure (Exit) 120/84 mmHg   Heart Rate (Admit) 106 bpm   Heart Rate (Exercise) 95 bpm   Heart Rate (Exit) 79 bpm   Oxygen Saturation (Admit) 96 %   Oxygen Saturation (Exercise) 96 %   Oxygen Saturation (Exit) 96 %   Rating of Perceived Exertion (Exercise) 14   Perceived Dyspnea (Exercise) 4   Intensity THRR unchanged   Progression Continue progressive overload as per policy without signs/symptoms or physical distress.   Resistance Training   Training Prescription Yes   Weight 3   Reps 10-12   Treadmill   MPH 1   Grade 0   Minutes 10   NuStep   Level 4   Watts 30   Minutes 10   Recumbant Elliptical   Level 3   RPM 35   Minutes 15      Functional Capacity:     6 Minute Walk      05/06/14 0900 06/30/14 1130 08/15/14 1241   6 Minute Walk   Phase  Mid Program Discharge   Distance 415 feet 600 feet 620 feet   Walk Time 4.3 minutes 6 minutes 6 minutes   Resting HR 83 bpm 83 bpm 75 bpm   Resting BP 132/80 mmHg 122/80 mmHg 122/84 mmHg   Max Ex. HR 106 bpm 92 bpm 81 bpm   Max Ex. BP 132/82 mmHg 164/82 mmHg 138/80 mmHg   RPE 15 15 17    Perceived Dyspnea  5 5 5    Symptoms   No      Quality of Life:   GD04 Depression Questionnaire Results      Pre 20     Post 5  SF36                                                                   Pre           Post             Physical Fx     12         12                Role Fx: Physical    6   6              Bodily Pain     8   10    General Health    15               16   Vitality  10               9    Social Fx     5                5   Role Fx: Emotional    3               6   Mental Health    19    27    Personal Goals: Goals established at orientation with interventions provided to work toward goal.     Personal Goals and Risk Factors at Admission - 06/30/14 1506    Personal Goals and Risk Factors on Admission    Weight Management Yes   Intervention Learn and follow the exercise and diet guidelines while in the program. Utilize the nutrition and education classes to help gain knowledge of the diet and exercise expectations in the program   Admit Weight 350 lb (158.759 kg)   Increase Aerobic Exercise and Physical Activity Yes   Intervention While in program, learn and follow the exercise prescription taught. Start at a low level workload and increase workload after able to maintain previous level for 30 minutes. Increase time before increasing intensity.   Understand more about Heart/Pulmonary Disease. Yes   Intervention While in program utilize professionals for any questions, and attend the education sessions. Great websites to use are www.americanheart.org or www.lung.org for reliable information.   Improve shortness of breath with ADL's Yes   Intervention While in program, learn and follow the exercise prescription taught. Start at a low level workload and increase workload ad advised by the exercise physiologist. Increase time before increasing intensity.   Develop more efficient breathing techniques such as purse lipped breathing and diaphragmatic breathing; and practicing self-pacing with activity Yes   Intervention While in program, learn and utilize the specific  breathing techniques taught to you. Continue to practice and use the techniques as needed.   Hypertension Yes   Goal Participant will see blood pressure controlled within the values of 140/41mm/Hg or within value directed by their physician.   Intervention Provide nutrition & aerobic exercise along with prescribed medications to achieve BP 140/90 or less.   Stress Yes   Goal To meet with psychosocial counselor for stress and relaxation information and guidance. To state understanding of performing relaxation techniques and or identifying personal stressors.   Intervention Provide education on types of stress, identifiying stressors, and ways to cope with stress. Provide demonstration and active practice of relaxation techniques.       Personal Goals Discharge:     Personal Goals at Discharge - 08/19/14 0740    Weight Management   Comments Mr Reaume has lost 22lbs during [facility] - a combination of exercise and diet which includes Fresh Foods, no salt, better choices with eating out, elimination of sweets, drink only water, and no eating between 8pm and 7am.   Increase Aerobic Exercise and Physical Activity   Goals Progress/Improvement seen  Yes   Comments Mr Dubray has increased his exercise goals on the TM, REL, and T5 and has increased resistive weights to 3lbs. His post 17mwd improved by 272ft. He plans to continue exercising in [employer].   Understand more about Heart/Pulmonary Disease   Goals Progress/Improvement seen  Yes   Comments Mr Krass has attended [facility] education classes and states he has learned disease management form them.   Improve shortness of breath with ADL's   Goals Progress/Improvement seen  Yes  Comments Mr Wisniewski's [employer] Shortness of Breath Questionnaire improved by 23points - Minimal Importance Difference is 5 points.   Breathing Techniques   Goals Progress/Improvement seen  Yes   Comments Mr Mcmahill uses PLB during his exercise in [facility] and with  activities at home, especially his daily walking.      Nutrition & Weight - Outcomes:      Post Biometrics - 08/15/14 1239     Post  Biometrics   Weight (!) 328 lb 4.8 oz (148.916 kg)   Waist Circumference 54.25 inches   Hip Circumference 63 inches   Waist to Hip Ratio 0.86 %      Nutrition:   Nutrition Discharge:   Education Questionnaire Score:   Goals reviewed with patient; copy given to patient.

## 2014-08-22 ENCOUNTER — Encounter: Payer: PPO | Admitting: *Deleted

## 2014-08-22 DIAGNOSIS — J449 Chronic obstructive pulmonary disease, unspecified: Secondary | ICD-10-CM | POA: Diagnosis not present

## 2014-08-22 DIAGNOSIS — J42 Unspecified chronic bronchitis: Secondary | ICD-10-CM

## 2014-08-22 NOTE — Progress Notes (Signed)
Daily Session Note  Patient Details  Name: ZAILEN ALBARRAN MRN: [id number] Date of Birth: [birth year] Referring Provider:  [name], MD  Encounter Date: 08/22/2014  Check In:     Session Check In - 08/22/14 1225    Check-In   Staff Present Frederich Cha RRT, RCP Respiratory Therapist;Farley Crooker Dillard [place] MS, ACSM CEP Exercise Physiologist   ER physicians immediately available to respond to emergencies [facility] immediately available ER MD   Physician(s) Kerman Passey and Lord   Medication changes reported     No   Fall or balance concerns reported    No   Warm-up and Cool-down Performed on first and last piece of equipment   VAD Patient? No   Pain Assessment   Currently in Pain? No/denies   Multiple Pain Sites No         Goals Met:  Proper associated with RPD/PD & O2 Sat Independence with exercise equipment Using PLB without cueing & demonstrates good technique Exercise tolerated well Strength training completed today  Goals Unmet:  Not Applicable  Goals Comments:   Dr. Emily Filbert is Medical Director for [place] and [facility].

## 2014-08-25 DIAGNOSIS — J449 Chronic obstructive pulmonary disease, unspecified: Secondary | ICD-10-CM | POA: Diagnosis not present

## 2014-08-25 NOTE — Progress Notes (Signed)
Daily Session Note  Patient Details  Name: Daniel Eaton MRN: [id number] Date of Birth: 1940-11-10 Referring Provider:  [name], MD  Encounter Date: 08/25/2014  Check In:     Session Check In - 08/25/14 1225    Check-In   Staff Present Carson Myrtle BS, RRT, Respiratory Therapist;Almena Hokenson BS, ACSM EP-C, Exercise Physiologist   ER physicians immediately available to respond to emergencies [facility] immediately available ER MD   Physician(s) goodman and williams   Medication changes reported     No   Fall or balance concerns reported    No   Warm-up and Cool-down Performed on first and last piece of equipment   VAD Patient? No   Pain Assessment   Currently in Pain? No/denies         Goals Met:  Proper associated with RPD/PD & O2 Sat Exercise tolerated well No report of cardiac concerns or symptoms Strength training completed today  Goals Unmet:  Not Applicable  Goals Comments:    Dr. Emily Filbert is Medical Director for [place] and [facility].

## 2014-08-27 ENCOUNTER — Encounter: Payer: PPO | Admitting: *Deleted

## 2014-08-27 DIAGNOSIS — J449 Chronic obstructive pulmonary disease, unspecified: Secondary | ICD-10-CM | POA: Diagnosis not present

## 2014-08-27 NOTE — Progress Notes (Signed)
Pulmonary Individual Treatment Plan  Patient Details  Name: Daniel Eaton MRN: [id number] Date of Birth: 1940/12/25 Referring Provider:  [name], MD  Initial Encounter Date: 05/06/2014  Visit Diagnosis: Obesity, morbid  Patient's Home Medications on Admission:  Current outpatient prescriptions:    amiodarone (PACERONE) 200 MG tablet, Take by mouth., Disp: , Rfl:    dabigatran (PRADAXA) 150 MG CAPS capsule, Take by mouth., Disp: , Rfl:    diphenhydrAMINE (BENADRYL) 25 mg capsule, Take by mouth., Disp: , Rfl:    levothyroxine (SYNTHROID, LEVOTHROID) 75 MCG tablet, Take by mouth., Disp: , Rfl:    metolazone (ZAROXOLYN) 2.5 MG tablet, Take by mouth., Disp: , Rfl:    metoprolol (LOPRESSOR) 100 MG tablet, Take by mouth., Disp: , Rfl:    Multiple Vitamin (MULTI-VITAMINS) TABS, Take by mouth., Disp: , Rfl:    omeprazole (PRILOSEC) 40 MG capsule, Take by mouth., Disp: , Rfl:    spironolactone (ALDACTONE) 25 MG tablet, Take by mouth., Disp: , Rfl:    torsemide (DEMADEX) 20 MG tablet, Take by mouth., Disp: , Rfl:   Past Medical History: No past medical history on file.  Tobacco Use: History  Smoking status   Not on file  Smokeless tobacco   Not on file    Labs: Recent Review Flowsheet Data    There is no flowsheet data to display.       ADL UCSD:     ADL UCSD      05/06/14 0900 06/30/14 1130 08/15/14 1130   ADL [facility]SD   ADL Phase Entry Mid Exit   SOB Score total 91 79 68   Rest 1 0 1   Walk 1 0 2   Stairs 5 5 5    Bath 3 2 1    Dress 2 2 1    Shop 5 5 5        Pulmonary Function Assessment:   Exercise Target Goals:    Exercise Program Goal: Individual exercise prescription set with THRR, safety & activity barriers. Participant demonstrates ability to understand and report RPE using BORG scale, to self-measure pulse accurately, and to acknowledge the importance of the exercise prescription.  Exercise Prescription Goal: Starting with aerobic  activity 30 plus minutes a day, 3 days per week for initial exercise prescription. Provide home exercise prescription and guidelines that participant acknowledges understanding prior to discharge.  Activity Barriers & Risk Stratification:   6 Minute Walk:     6 Minute Walk      05/06/14 0900 06/30/14 1130 08/15/14 1241   6 Minute Walk   Phase  Mid Program Discharge   Distance 415 feet 600 feet 620 feet   Walk Time 4.3 minutes 6 minutes 6 minutes   Resting HR 83 bpm 83 bpm 75 bpm   Resting BP 132/80 mmHg 122/80 mmHg 122/84 mmHg   Max Ex. HR 106 bpm 92 bpm 81 bpm   Max Ex. BP 132/82 mmHg 164/82 mmHg 138/80 mmHg   RPE 15 15 17    Perceived Dyspnea  5 5 5    Symptoms   No      Initial Exercise Prescription:   Exercise Prescription Changes:     Exercise Prescription Changes      07/02/14 1500 07/07/14 1000 07/07/14 1400 07/28/14 1200 07/30/14 1400   Exercise Review   Progression Yes Yes Yes Yes Yes   Response to Exercise   Blood Pressure (Admit)  122/78 mmHg 122/78 mmHg  120/80 mmHg   Blood Pressure (Exercise)  130/84 mmHg 130/84  mmHg  132/82 mmHg   Blood Pressure (Exit)  120/84 mmHg 120/84 mmHg  118/70 mmHg   Heart Rate (Admit)   106 bpm  78 bpm   Heart Rate (Exercise)   95 bpm  112 bpm   Heart Rate (Exit)   79 bpm  72 bpm   Oxygen Saturation (Admit)   96 %  97 %   Oxygen Saturation (Exercise)   96 %  97 %   Oxygen Saturation (Exit)   96 %  96 %   Rating of Perceived Exertion (Exercise)   14  15   Perceived Dyspnea (Exercise)   4  3   Intensity  THRR unchanged THRR unchanged     Progression  Continue progressive overload as per policy without signs/symptoms or physical distress. Continue progressive overload as per policy without signs/symptoms or physical distress.     Resistance Training   Training Prescription  Yes Yes     Weight  3 3     Reps  10-12 10-12     Treadmill   MPH 0.7 0.7 0.7     Grade 0 0 0     Minutes 10 10 10      NuStep   Level  3 3 4   T5 Nu step     Watts  25 25 30     Minutes  10 10     Recumbant Elliptical   Level  3 3     RPM  35 35     Minutes  15 15       08/04/14 1200 08/08/14 1200 08/11/14 1200 08/25/14 1200     Exercise Review   Progression Yes Yes Yes Yes    Response to Exercise   Blood Pressure (Admit) 120/80 mmHg 122/78 mmHg 122/78 mmHg 122/78 mmHg    Blood Pressure (Exercise) 132/82 mmHg 130/84 mmHg 130/84 mmHg 130/84 mmHg    Blood Pressure (Exit) 118/70 mmHg 120/84 mmHg 120/84 mmHg 120/84 mmHg    Heart Rate (Admit) 78 bpm 106 bpm 106 bpm 106 bpm    Heart Rate (Exercise) 112 bpm 95 bpm 95 bpm 95 bpm    Heart Rate (Exit) 72 bpm 79 bpm 79 bpm 79 bpm    Oxygen Saturation (Admit) 97 % 96 % 96 % 96 %    Oxygen Saturation (Exercise) 97 % 96 % 96 % 96 %    Oxygen Saturation (Exit) 96 % 96 % 96 % 96 %    Rating of Perceived Exertion (Exercise) 15 14 14 14     Perceived Dyspnea (Exercise) 3 4 4 4     Intensity  THRR unchanged THRR unchanged THRR unchanged    Progression Continue progressive overload as per policy without signs/symptoms or physical distress. Continue progressive overload as per policy without signs/symptoms or physical distress. Continue progressive overload as per policy without signs/symptoms or physical distress. Continue progressive overload as per policy without signs/symptoms or physical distress.    Resistance Training   Training Prescription  Yes Yes Yes    Weight  3 3 3     Reps  10-12 10-12 10-12    Treadmill   MPH  0.9 1 1     Grade  0 0 0    Minutes  10 10 12     NuStep   Level  4 4 4     Watts  30 30 30     Minutes  10 10 10     Recumbant Elliptical   Level 4 3 3  3  RPM  35 35 35    Minutes 15 15 15 15        Discharge Exercise Prescription (Final Exercise Prescription Changes):     Exercise Prescription Changes - 08/25/14 1200    Exercise Review   Progression Yes   Response to Exercise   Blood Pressure (Admit) 122/78 mmHg   Blood Pressure (Exercise) 130/84 mmHg   Blood Pressure  (Exit) 120/84 mmHg   Heart Rate (Admit) 106 bpm   Heart Rate (Exercise) 95 bpm   Heart Rate (Exit) 79 bpm   Oxygen Saturation (Admit) 96 %   Oxygen Saturation (Exercise) 96 %   Oxygen Saturation (Exit) 96 %   Rating of Perceived Exertion (Exercise) 14   Perceived Dyspnea (Exercise) 4   Intensity THRR unchanged   Progression Continue progressive overload as per policy without signs/symptoms or physical distress.   Resistance Training   Training Prescription Yes   Weight 3   Reps 10-12   Treadmill   MPH 1   Grade 0   Minutes 12   NuStep   Level 4   Watts 30   Minutes 10   Recumbant Elliptical   Level 3   RPM 35   Minutes 15       Nutrition:  Target Goals: Understanding of nutrition guidelines, daily intake of sodium 1500mg , cholesterol 200mg , calories 30% from fat and 7% or less from saturated fats, daily to have 5 or more servings of fruits and vegetables.  Biometrics:      Post Biometrics - 08/15/14 1239     Post  Biometrics   Weight (!) 328 lb 4.8 oz (148.916 kg)   Waist Circumference 54.25 inches   Hip Circumference 63 inches   Waist to Hip Ratio 0.86 %      Nutrition Therapy Plan and Nutrition Goals:   Nutrition Discharge: Rate Your Plate Scores:   Psychosocial: Target Goals: Acknowledge presence or absence of depression, maximize coping skills, provide positive support system. Participant is able to verbalize types and ability to use techniques and skills needed for reducing stress and depression.  Initial Review & Psychosocial Screening:   Quality of Life Scores:   PHQ-9:     Recent Review Flowsheet Data    There is no flowsheet data to display.      Psychosocial Evaluation and Intervention:     Psychosocial Evaluation - 08/27/14 1227    Discharge Psychosocial Assessment & Intervention   Comments Follow up with Mr. Guia reporting "graduating" today from this program.  He states he has benefited from all aspects of this program,  educationally, socially, and the exercise consistency.  He reports less shortness of breath typically.  He continues to struggle with mood problems but is seeing the therapist regularly that counselor recommended.  Mr. Paar plans to maintain his progress with working out at the gym where he has a Higher education careers adviser until he can get into the [employer]" follow up program here, which he is currently on a wait list.  Counselor commended Mr. Hankin for his hard work in accomplishing many of his goals.  He would still like to be able to walk with a cane and not a walker, but that is a future goal  to work towards.        Psychosocial Re-Evaluation:     Psychosocial Re-Evaluation      06/23/14 1603           Psychosocial Re-Evaluation   Interventions Therapist referral  Comments --  Follow up with Mr. L reporting finally connected with therapist recommended by counselor and has an appointment for this Thursday 5/12.  Mr. Carlean Jews was pleased about this and looking forward to this session.  Counselor will continue to follow as needed.          Education: Education Goals: Education classes will be provided on a weekly basis, covering required topics. Participant will state understanding/return demonstration of topics presented.  Learning Barriers/Preferences:   Education Topics: Initial Evaluation Education: - Verbal, written and demonstration of respiratory meds, RPE/PD scales, oximetry and breathing techniques. Instruction on use of nebulizers and MDIs: cleaning and proper use, rinsing mouth with steroid doses and importance of monitoring MDI activations.   General Nutrition Guidelines/Fats and Fiber: -Group instruction provided by verbal, written material, models and posters to present the general guidelines for heart healthy nutrition. Gives an explanation and review of dietary fats and fiber.   Controlling Sodium/Reading Food Labels: -Group verbal and written material supporting the  discussion of sodium use in heart healthy nutrition. Review and explanation with models, verbal and written materials for utilization of the food label.   Exercise Physiology & Risk Factors: - Group verbal and written instruction with models to review the exercise physiology of the cardiovascular system and associated critical values. Details cardiovascular disease risk factors and the goals associated with each risk factor.   Aerobic Exercise & Resistance Training: - Gives group verbal and written discussion on the health impact of inactivity. On the components of aerobic and resistive training programs and the benefits of this training and how to safely progress through these programs.   Flexibility, Balance, General Exercise Guidelines: - Provides group verbal and written instruction on the benefits of flexibility and balance training programs. Provides general exercise guidelines with specific guidelines to those with heart or lung disease. Demonstration and skill practice provided.   Stress Management: - Provides group verbal and written instruction about the health risks of elevated stress, cause of high stress, and healthy ways to reduce stress.   Depression: - Provides group verbal and written instruction on the correlation between heart/lung disease and depressed mood, treatment options, and the stigmas associated with seeking treatment.   Exercise & Equipment Safety: - Individual verbal instruction and demonstration of equipment use and safety with use of the equipment.   Infection Prevention: - Provides verbal and written material to individual with discussion of infection control including proper hand washing and proper equipment cleaning during exercise session.   Falls Prevention: - Provides verbal and written material to individual with discussion of falls prevention and safety.   Diabetes: - Individual verbal and written instruction to review signs/symptoms of  diabetes, desired ranges of glucose level fasting, after meals and with exercise. Advice that pre and post exercise glucose checks will be done for 3 sessions at entry of program.   Chronic Lung Diseases: - Group verbal and written instruction to review new updates, new respiratory medications, new advancements in procedures and treatments. Provide informative websites and "800" numbers of self-education.   Lung Procedures: - Group verbal and written instruction to describe testing methods done to diagnose lung disease. Review the outcome of test results. Describe the treatment choices: Pulmonary Function Tests, ABGs and oximetry.   Energy Conservation: - Provide group verbal and written instruction for methods to conserve energy, plan and organize activities. Instruct on pacing techniques, use of adaptive equipment and posture/positioning to relieve shortness of breath.   Triggers: - Group verbal and  written instruction to review types of environmental controls: home humidity, furnaces, filters, dust mite/pet prevention, HEPA vacuums. To discuss weather changes, air quality and the benefits of nasal washing.   Exacerbations: - Group verbal and written instruction to provide: warning signs, infection symptoms, calling MD promptly, preventive modes, and value of vaccinations. Review: effective airway clearance, coughing and/or vibration techniques. Create an Sports administrator.   Oxygen: - Individual and group verbal and written instruction on oxygen therapy. Includes supplement oxygen, available portable oxygen systems, continuous and intermittent flow rates, oxygen safety, concentrators, and Medicare reimbursement for oxygen.   Respiratory Medications: - Group verbal and written instruction to review medications for lung disease. Drug class, frequency, complications, importance of spacers, rinsing mouth after steroid MDI's, and proper cleaning methods for nebulizers.   AED/CPR: - Group  verbal and written instruction with the use of models to demonstrate the basic use of the AED with the basic ABC's of resuscitation.   Breathing Retraining: - Provides individuals verbal and written instruction on purpose, frequency, and proper technique of diaphragmatic breathing and pursed-lipped breathing. Applies individual practice skills.   Anatomy and Physiology of the Lungs: - Group verbal and written instruction with the use of models to provide basic lung anatomy and physiology related to function, structure and complications of lung disease.   Heart Failure: - Group verbal and written instruction on the basics of heart failure: signs/symptoms, treatments, explanation of ejection fraction, enlarged heart and cardiomyopathy.   Sleep Apnea: - Individual verbal and written instruction to review Obstructive Sleep Apnea. Review of risk factors, methods for diagnosing and types of masks and machines for OSA.   Anxiety: - Provides group, verbal and written instruction on the correlation between heart/lung disease and anxiety, treatment options, and management of anxiety.   Relaxation: - Provides group, verbal and written instruction about the benefits of relaxation for patients with heart/lung disease. Also provides patients with examples of relaxation techniques.   Knowledge Questionnaire Score:   Personal Goals and Risk Factors at Admission:     Personal Goals and Risk Factors at Admission - 06/30/14 1506    Personal Goals and Risk Factors on Admission    Weight Management Yes   Intervention Learn and follow the exercise and diet guidelines while in the program. Utilize the nutrition and education classes to help gain knowledge of the diet and exercise expectations in the program   Admit Weight 350 lb (158.759 kg)   Increase Aerobic Exercise and Physical Activity Yes   Intervention While in program, learn and follow the exercise prescription taught. Start at a low level  workload and increase workload after able to maintain previous level for 30 minutes. Increase time before increasing intensity.   Understand more about Heart/Pulmonary Disease. Yes   Intervention While in program utilize professionals for any questions, and attend the education sessions. Great websites to use are www.americanheart.org or www.lung.org for reliable information.   Improve shortness of breath with ADL's Yes   Intervention While in program, learn and follow the exercise prescription taught. Start at a low level workload and increase workload ad advised by the exercise physiologist. Increase time before increasing intensity.   Develop more efficient breathing techniques such as purse lipped breathing and diaphragmatic breathing; and practicing self-pacing with activity Yes   Intervention While in program, learn and utilize the specific breathing techniques taught to you. Continue to practice and use the techniques as needed.   Hypertension Yes   Goal Participant will see blood pressure controlled  within the values of 140/87mm/Hg or within value directed by their physician.   Intervention Provide nutrition & aerobic exercise along with prescribed medications to achieve BP 140/90 or less.   Stress Yes   Goal To meet with psychosocial counselor for stress and relaxation information and guidance. To state understanding of performing relaxation techniques and or identifying personal stressors.   Intervention Provide education on types of stress, identifiying stressors, and ways to cope with stress. Provide demonstration and active practice of relaxation techniques.      Personal Goals and Risk Factors Review:      Goals and Risk Factor Review      06/30/14 1510 07/02/14 1130 07/09/14 1130 07/30/14 1130 08/01/14 1232   Weight Management   Goals Progress/Improvement seen   Yes Yes    Comments   Mr Ellen and his wife have started [employer] which provides them 3 healthy dinner meals a  week. New weight management goals - eliminate sweets, avoid sodium, no eating after 8pm,coffee only once a day, and drink only water.    Increase Aerobic Exercise and Physical Activity   Goals Progress/Improvement seen  Yes Yes  Yes Yes   Comments Mid 24mwd - Mr Gersten improved by 154ft and he could walk the entire 6 minutes. Mr Geraghty started a new exercise goal on the treadmill. His stamina has improved to walk on the TM.  Mr Noon has set personal goals for exercise for LW - REL 78mins, L5, 35rpm; TM 1.29mph, 0%, 30mins; and T5 L5, 25w, 41mins; he will do balance exercises at home and I encouraged him to walk at least 1-3 63mins intervals in his home. Discussed with Kristin Bruins that instead of increasing him on his levels for the T5 and REL that we would put a focus on him trying to pedal faster and maintain that speed for a higher watts and RPMs average. I explained the benefits of maintaining a higher speed and why we set minimum standards with watts and RPMs.    Understand more about Heart/Pulmonary Disease   Goals Progress/Improvement seen     Yes    Comments    Mr Wexler attends education sessions.    Improve shortness of breath with ADL's   Goals Progress/Improvement seen  Yes       Comments Mr Yerger improved his UCSD Shortness of breath scores by 12 points and states he has less shortness of breath with ADL.         08/01/14 1509 08/06/14 1130 08/06/14 1530       Weight Management   Goals Progress/Improvement seen Yes  Yes     Comments Mr. Buttram has lost >20 lbs. since starting Pulmonary Rehabilation  21lbs lost since starting LW!     Increase Aerobic Exercise and Physical Activity   Goals Progress/Improvement seen   Yes      Comments  Mr Fillingim reached a personal goal on the treadmill today - 1.0 for 67mins and is working toward another goal on the T5 to 26mins - today he went 31mins.      Improve shortness of breath with ADL's   Goals Progress/Improvement seen  Yes       Comments  Mr. Statzer says that he is able to walk longer distances without stopping as much.  He aslo states that it is easier to get in and out of his car.          Personal Goals Discharge:      Personal Goals  at Discharge - 08/19/14 0740    Weight Management   Comments Mr Weldon has lost 22lbs during [facility] - a combination of exercise and diet which includes [employer], no salt, better choices with eating out, elimination of sweets, drink only water, and no eating between 8pm and 7am.   Increase Aerobic Exercise and Physical Activity   Goals Progress/Improvement seen  Yes   Comments Mr Berenson has increased his exercise goals on the TM, REL, and T5 and has increased resistive weights to 3lbs. His post 70mwd improved by 274ft. He plans to continue exercising in [employer].   Understand more about Heart/Pulmonary Disease   Goals Progress/Improvement seen  Yes   Comments Mr Hagy has attended [facility] education classes and states he has learned disease management form them.   Improve shortness of breath with ADL's   Goals Progress/Improvement seen  Yes   Comments Mr Tissue's UCSD Shortness of Breath Questionnaire improved by 23points - Minimal Importance Difference is 5 points.   Breathing Techniques   Goals Progress/Improvement seen  Yes   Comments Mr Daman uses PLB during his exercise in [facility] and with activities at home, especially his daily walking.      Comments: Mr Blowe graduated from [employer] today and will attend [employer] until [employer] class is available to attend. Thank you for the opportunity to work with your patient, Endi Lagman.

## 2014-08-27 NOTE — Progress Notes (Signed)
Daily Session Note  Patient Details  Name: Daniel Eaton MRN: [id number] Date of Birth: 06-09-1940 Referring Provider:  [name], MD  Encounter Date: 08/27/2014  Check In:     Session Check In - 08/27/14 1215    Check-In   Staff Present Daniel Norse MS, ACSM CEP Exercise Physiologist;Daniel Eaton, RRT, Respiratory Therapist;Daniel Eaton BS, ACSM EP-C, Exercise Physiologist   ER physicians immediately available to respond to emergencies [facility] immediately available ER MD   Physician(s) Daniel Eaton and Daniel Eaton   Medication changes reported     No   Fall or balance concerns reported    No   Warm-up and Cool-down Performed on first and last piece of equipment   VAD Patient? No   Pain Assessment   Currently in Pain? No/denies   Multiple Pain Sites No         Goals Met:  Proper associated with RPD/PD & O2 Sat Independence with exercise equipment Using PLB without cueing & demonstrates good technique Exercise tolerated well Personal goals reviewed Strength training completed today  Goals Unmet:  Not Applicable  Goals Comments: Today is Daniel Eaton's graduation from Pulmonary Rehab. His exercise plans and goals have been reviewed with him. He is eager to continue exercising and continuing his progress. Him and his wife are on the waiting list for [facility] and will be called as soon as a spot opens up.    Dr. Emily Eaton is Medical Director for [facility] and [facility].

## 2014-09-03 ENCOUNTER — Encounter: Payer: Self-pay | Admitting: Physical Therapy

## 2014-09-03 ENCOUNTER — Ambulatory Visit: Payer: PPO | Attending: Internal Medicine | Admitting: Physical Therapy

## 2014-09-03 VITALS — HR 84

## 2014-09-03 DIAGNOSIS — R269 Unspecified abnormalities of gait and mobility: Secondary | ICD-10-CM

## 2014-09-03 DIAGNOSIS — M6281 Muscle weakness (generalized): Secondary | ICD-10-CM | POA: Diagnosis present

## 2014-09-04 NOTE — Therapy (Signed)
[facility] [address] [place], [place], [address] Phone: [phone]   Fax:  [phone]  Physical Therapy Evaluation  Patient Details  Name: Daniel Eaton MRN: [id number] Date of Birth: [birth year] Referring Provider:  [name], MD  Encounter Date: 09/03/2014      PT End of Session - 09/04/14 1916    Visit Number 1   Number of Visits 12   Date for PT Re-Evaluation 10/15/14   Authorization - Visit Number 1   Authorization - Number of Visits 10   PT Start Time 1950   PT Stop Time 1542   PT Time Calculation (min) 74 min   Activity Tolerance Patient tolerated treatment well;Patient limited by fatigue   Behavior During Therapy [facility] for tasks assessed/performed      History reviewed. No pertinent past medical history.  History reviewed. No pertinent past surgical history.  Filed Vitals:   09/03/14 1456  Pulse: 84  SpO2: 96%    Visit Diagnosis:  Gait difficulty  Muscle weakness      Subjective Assessment - 09/04/14 1903    Subjective Pt. reports no pain and no falls.  Pt. has been working hard on wt. loss and generalized strengthening to progress to [facility].  Pt. currently using RW to ambulate at home/ community.  Pt. recently completed [facility] program at [facility].     Limitations Lifting;Standing;Walking   Patient Stated Goals Increase LE muscle strength/ improve walking/ balance.    Currently in Pain? No/denies            [facility] PT Assessment - 09/04/14 0001    Assessment   Medical Diagnosis Ataxia   Onset Date/Surgical Date 02/14/13   Prior Therapy yes       OBJECTIVE: There.ex:  See handouts.  Nustep L4-5 15 min.B UE/LE (1205 steps) HR 95 bpm and O2 sat 95%    Pt response for medical necessity:  Instruction for proper technique with HEP.  SBA with use of RW and CGA while walking in //-bars.         PT Education - 09/04/14 1915    Education provided Yes   Education Details see handtouts   Person(s) Educated Patient   Methods Demonstration;Explanation;Handout;Verbal cues   Comprehension Verbalized understanding;Returned demonstration             PT Long Term Goals - 09/04/14 1940    PT LONG TERM GOAL #1   Title Pt. I with HEP to increase B LE muscle strength to grossly 4+/5 MMT to improve balance/ safety with gait.     Time 6   Period Weeks   Status New   PT LONG TERM GOAL #2   Title Pt. will increase Berg balance test to >48/56 to improve balance/ safety with gait.     Baseline 41/56 on 09/03/14   Time 6   Period Weeks   Status New   PT LONG TERM GOAL #3   Title Pt. able to tolerate 30 minutes of ther.ex. with no rest breaks safely.     Time 6   Period Weeks   Status New   PT LONG TERM GOAL #4   Title Pt. will ambulate with normalized gait pattern wtih use of SPC and proper 2-point gait pattern to improve independence.     Time 6   Period Weeks   Status New            Plan - 09/04/14 1920    Clinical Impression  Statement Pt. is a pleasant 74 y/o male with a h/o B LE muscle weakness, balance issues, and gait difficulty.  Pt. states he is really motivated to loss wt. and ambulates with least restrictive device.  Pt. is planning to participate with Forever [employer] ex. program to assist with wt. loss/ overall condiitioning. B LE AROM WFL and strength grossly 4/5 MMT except quads/hamstrings 4+/5 MMT.  Pt. currently weights 322.6# and reports 25# wt. loss over past month.  H/o L sided muscle weakness due to old CVA and difficulty lifting L hip/LE while attempting tandem/SLS./ stairs.  Berg balance test: 41/56.  Pt. ambulates with L antalgic gait pattern and use of RW.  Extra time and UE assist to stand from chair and safely turn/ maneuver around clinic.  Pt. will benefit from skilled PT services to increase B LE muscle strength to improve balance and safety with walking with least restrictive device.     Pt will benefit from skilled therapeutic intervention in order to  improve on the following deficits Abnormal gait;Decreased endurance;Impaired sensation;Obesity;Increased edema;Cardiopulmonary status limiting activity;Decreased activity tolerance;Decreased knowledge of use of DME;Decreased strength;Difficulty walking;Decreased mobility;Decreased balance;Decreased range of motion;Improper body mechanics;Postural dysfunction;Decreased safety awareness;Decreased coordination   Rehab Potential Good   PT Frequency 2x / week   PT Duration 6 weeks   PT Treatment/Interventions ADLs/Self Care Home Management;Neuromuscular re-education;Aquatic Therapy;Gait training;Balance training;Therapeutic exercise;Therapeutic activities;Manual techniques;Functional mobility training;Stair training;Energy conservation   PT Next Visit Plan reassess HEP   PT Home Exercise Plan see handouts.    Recommended Other Services aquatic ex.    Consulted and Agree with Plan of Care Patient          G-Codes - Sep 23, 2014 1946    Functional Assessment Tool Used Merrilee Jansky balance test/ gait pattern/ clinical impression   Functional Limitation Mobility: Walking and moving around   Mobility: Walking and Moving Around Current Status [phone]) At least 40 percent but less than 60 percent impaired, limited or restricted   Mobility: Walking and Moving Around Goal Status [phone]) At least 1 percent but less than 20 percent impaired, limited or restricted       Problem List There are no active problems to display for this patient.  Pura Spice, PT, DPT # [phone]   09/04/2014, 7:50 PM  [facility] [address] [place], [place], [address] Phone: [phone]   Fax:  [phone]

## 2014-09-08 ENCOUNTER — Ambulatory Visit: Payer: PPO | Admitting: Physical Therapy

## 2014-09-08 ENCOUNTER — Encounter: Payer: Self-pay | Admitting: Physical Therapy

## 2014-09-08 DIAGNOSIS — R269 Unspecified abnormalities of gait and mobility: Secondary | ICD-10-CM | POA: Diagnosis not present

## 2014-09-08 DIAGNOSIS — M6281 Muscle weakness (generalized): Secondary | ICD-10-CM

## 2014-09-09 NOTE — Therapy (Signed)
[place] [facility] [address] [place], [place], [address] Phone: [phone]   Fax:  [phone]  Physical Therapy Treatment  Patient Details  Name: Daniel Eaton MRN: [id number] Date of Birth: 27-Sep-1940 Referring Provider:  [name], MD  Encounter Date: 09/08/2014      PT End of Session - 09/09/14 1449    Visit Number 2   Number of Visits 12   Date for PT Re-Evaluation 10/15/14   Authorization - Visit Number 2   Authorization - Number of Visits 10   PT Start Time 9678   PT Stop Time 1526   PT Time Calculation (min) 49 min   Equipment Utilized During Treatment Gait belt   Activity Tolerance Patient tolerated treatment well;Patient limited by fatigue   Behavior During Therapy [facility] for tasks assessed/performed      History reviewed. No pertinent past medical history.  History reviewed. No pertinent past surgical history.  There were no vitals filed for this visit.  Visit Diagnosis:  Gait difficulty  Muscle weakness      Subjective Assessment - 09/09/14 1448    Subjective Pt. reports limited compliance with HEP and only completed 1x since last visit (yesterday).  Pt. reports generalized muscle soreness but no pain or falls since last visit.  Pt. entered PT with use of RW and carrying SPC.     Limitations Lifting;Standing;Walking   Patient Stated Goals Increase LE muscle strength/ improve walking/ balance.    Currently in Pain? No/denies      LEFS: 13 out of 80 (09/08/14).    OBJECTIVE:  TM walking 1.0 mph for 3.5 min./1.5 min./2.5 min. (fatigue/ difficulty with breathing reported).  Reviewed standing hip ex. Program.  Neuro reed.: Sit to stands (extra time/ instruction on technique).  Walking cone touches in //-bars L/R with 1 UE assist.  Increase hip flexion/ forward and backwards walking in //-bars with 1 UE assist progressing to no UE assist 4x.  Resisted gait 5x all 4-planes (rest break required)- moderate  fatigue.  Walking in clinic/ hallway with SBA/CGA with use of hurrycane working on 2-point gait pattern/ upright posture/ heel strike/ consistent step pattern.  No LOB but cautious/ unsteady gait.   Nustep L5 15 min. B UE/LE.    Pt response for medical necessity:  Easily fatigued with walking/ balance tasks.  No rest breaks required with Nustep but 3 min. Of balance tasks requires 1 seated rest breaks.  Extra time to stand from chair with proper technique.  PT encouraged pt. To complete HEP as prescribed.         PT Long Term Goals - 09/04/14 1940    PT LONG TERM GOAL #1   Title Pt. I with HEP to increase B LE muscle strength to grossly 4+/5 MMT to improve balance/ safety with gait.     Time 6   Period Weeks   Status New   PT LONG TERM GOAL #2   Title Pt. will increase Berg balance test to >48/56 to improve balance/ safety with gait.     Baseline 41/56 on 09/03/14   Time 6   Period Weeks   Status New   PT LONG TERM GOAL #3   Title Pt. able to tolerate 30 minutes of ther.ex. with no rest breaks safely.     Time 6   Period Weeks   Status New   PT LONG TERM GOAL #4   Title Pt. will ambulate with normalized gait pattern wtih  use of SPC and proper 2-point gait pattern to improve independence.     Time 6   Period Weeks   Status New           Plan - 09/09/14 1451    Clinical Impression Statement Several short standing/seated rest breaks required during walking/ standing resisted ex. program.  Good technique with HEP but limited by fatigue/ breathing issues.  Use of 1 UE required with cone touches/ proprioception and balance tasks in //-bars.  No LOB but extra time with L LE muscle control/ cone taps.     Pt will benefit from skilled therapeutic intervention in order to improve on the following deficits Abnormal gait;Decreased endurance;Impaired sensation;Obesity;Increased edema;Cardiopulmonary status limiting activity;Decreased activity tolerance;Decreased knowledge of use of  DME;Decreased strength;Difficulty walking;Decreased mobility;Decreased balance;Decreased range of motion;Improper body mechanics;Postural dysfunction;Decreased safety awareness;Decreased coordination   Rehab Potential Good   PT Frequency 2x / week   PT Duration 6 weeks   PT Treatment/Interventions ADLs/Self Care Home Management;Neuromuscular re-education;Aquatic Therapy;Gait training;Balance training;Therapeutic exercise;Therapeutic activities;Manual techniques;Functional mobility training;Stair training;Energy conservation   PT Next Visit Plan reassess HEP   PT Home Exercise Plan see handouts.    Recommended Other Services aquatic   Consulted and Agree with Plan of Care Patient        Problem List There are no active problems to display for this patient.  Pura Spice, PT, DPT # [phone]   09/09/2014, 6:11 PM  [facility] [address] [place], [place], [address] Phone: [phone]   Fax:  [phone]

## 2014-09-10 ENCOUNTER — Encounter: Payer: PPO | Admitting: Physical Therapy

## 2014-09-11 ENCOUNTER — Encounter: Payer: PPO | Admitting: Physical Therapy

## 2014-09-15 ENCOUNTER — Ambulatory Visit: Payer: PPO | Attending: Internal Medicine | Admitting: Physical Therapy

## 2014-09-15 DIAGNOSIS — R269 Unspecified abnormalities of gait and mobility: Secondary | ICD-10-CM | POA: Diagnosis present

## 2014-09-15 DIAGNOSIS — M6281 Muscle weakness (generalized): Secondary | ICD-10-CM | POA: Insufficient documentation

## 2014-09-15 NOTE — Therapy (Signed)
[place] [facility] [address] [place], [place], [address] Phone: [phone]   Fax:  [phone]  Physical Therapy Treatment  Patient Details  Name: Daniel Eaton MRN: [id number] Date of Birth: Apr 06, 1940 Referring Provider:  [name], MD  Encounter Date: 09/15/2014      PT End of Session - 09/15/14 1629    Visit Number 3   Number of Visits 12   Date for PT Re-Evaluation 10/15/14   Authorization - Visit Number 3   Authorization - Number of Visits 10   PT Start Time 1111   PT Stop Time 1205   PT Time Calculation (min) 54 min   Equipment Utilized During Treatment Gait belt   Activity Tolerance Patient tolerated treatment well;Patient limited by fatigue   Behavior During Therapy [facility] for tasks assessed/performed      No past medical history on file.  No past surgical history on file.  There were no vitals filed for this visit.  Visit Diagnosis:  Gait difficulty  Muscle weakness      Subjective Assessment - 09/15/14 1126    Subjective Pt reports no pain today. Pt states that new Forever Fit session begins tomorrow (8/2).  Pt. will have PT on M,W and Forever[employer] on T,Th.  Pt. weighs 319# (30# wt. loss since June 1st).   Limitations Lifting;Standing;Walking   Patient Stated Goals Increase LE muscle strength/ improve walking/ balance.    Currently in Pain? No/denies   Multiple Pain Sites No     Objective: There.ex: forwards and backwards walking in the parallel bars without UE support. Sidestepping with no UE support 3x in //-bars.  Standing hamstring curls with bilateral UE light support x 20 in // bars. Heel raises x 20 in // bars with some difficulty.  Neuro. mm:  Sit to standing instruction 5x with one airex pad in chair. In the // bars, airex pad  Balance EC, weight shifts, step ups and over with gait training. Gait training:  Amb. With consistent recip. Step through gait pattern with normal BOS and use of SPC.   2-point gait pattern with cuing to increase hip/ knee flexion to improve stride length.       Medical necessity for treatment:  Instruction required for progression of standing/walking there.ex.  SBA/CGA with gait and balance tasks.  Pt requires cueing for breathing with all exercises.          PT Long Term Goals - 09/04/14 1940    PT LONG TERM GOAL #1   Title Pt. I with HEP to increase B LE muscle strength to grossly 4+/5 MMT to improve balance/ safety with gait.     Time 6   Period Weeks   Status New   PT LONG TERM GOAL #2   Title Pt. will increase Berg balance test to >48/56 to improve balance/ safety with gait.     Baseline 41/56 on 09/03/14   Time 6   Period Weeks   Status New   PT LONG TERM GOAL #3   Title Pt. able to tolerate 30 minutes of ther.ex. with no rest breaks safely.     Time 6   Period Weeks   Status New   PT LONG TERM GOAL #4   Title Pt. will ambulate with normalized gait pattern wtih use of SPC and proper 2-point gait pattern to improve independence.     Time 6   Period Weeks   Status New  Plan - 09/15/14 1630    Clinical Impression Statement Pt. remains easliy fatigued t/o tx. session and requires extra time/attempts to stand from bariatic.  Pt. benefits from 2 in. lift on chair to assist with standing.  No LOB during tx. session today but pt. continues to benefit from use of RW/ SPC.  Increase fatgiue with walking with SPC in gym/ hallway.  Increase SOB with standing/walking tasks.    Pt will benefit from skilled therapeutic intervention in order to improve on the following deficits Abnormal gait;Decreased endurance;Impaired sensation;Obesity;Increased edema;Cardiopulmonary status limiting activity;Decreased activity tolerance;Decreased knowledge of use of DME;Decreased strength;Difficulty walking;Decreased mobility;Decreased balance;Decreased range of motion;Improper body mechanics;Postural dysfunction;Decreased safety awareness;Decreased  coordination   Rehab Potential Good   PT Frequency 2x / week   PT Duration 6 weeks   PT Treatment/Interventions ADLs/Self Care Home Management;Neuromuscular re-education;Aquatic Therapy;Gait training;Balance training;Therapeutic exercise;Therapeutic activities;Manual techniques;Functional mobility training;Stair training;Energy conservation        Problem List There are no active problems to display for this patient.  Pura Spice, PT, DPT # [phone]   09/15/2014, 4:34 PM  [facility] [address] [place], [place], [address] Phone: [phone]   Fax:  [phone]

## 2014-09-16 ENCOUNTER — Encounter: Payer: PPO | Admitting: Physical Therapy

## 2014-09-17 ENCOUNTER — Ambulatory Visit: Payer: PPO | Admitting: Physical Therapy

## 2014-09-17 DIAGNOSIS — R269 Unspecified abnormalities of gait and mobility: Secondary | ICD-10-CM

## 2014-09-17 DIAGNOSIS — M6281 Muscle weakness (generalized): Secondary | ICD-10-CM

## 2014-09-17 NOTE — Therapy (Signed)
[place] [facility] [address] [place], [place], [address] Phone: [phone]   Fax:  [phone]  Physical Therapy Treatment  Patient Details  Name: Daniel Eaton MRN: [id number] Date of Birth: 19-Jul-1940 Referring Provider:  [name], MD  Encounter Date: 09/17/2014      PT End of Session - 09/17/14 1624    Visit Number 4   Number of Visits 12   Date for PT Re-Evaluation 10/15/14   Authorization - Visit Number 4   Authorization - Number of Visits 10   PT Start Time 1112   PT Stop Time 1202   PT Time Calculation (min) 50 min   Equipment Utilized During Treatment Gait belt   Activity Tolerance Patient tolerated treatment well;Patient limited by fatigue   Behavior During Therapy [facility] for tasks assessed/performed      No past medical history on file.  No past surgical history on file.  There were no vitals filed for this visit.  Visit Diagnosis:  Gait difficulty  Muscle weakness      Subjective Assessment - 09/17/14 1623    Subjective Pt reports no pain upon arrival today and that [place] session was only 15 mins of work.    Limitations Lifting;Standing;Walking   Patient Stated Goals Increase LE muscle strength/ improve walking/ balance.    Currently in Pain? No/denies   Multiple Pain Sites No     Weight: 320.2#    Objective:  Gait training: Amb. With consistent recip. Step through gait pattern with normal BOS and use of SPC. 2-point gait pattern with cuing to increase hip/ knee flexion to improve stride length for 2 min. 35 sec. (HR 101 bpm/ 99 O2 sat.). There.ex: Step ups on 6" step with L LE 12x and bilateral handrails (no R LE step ups secondary to knee pain/ issues).  Neuro. mm: Sit to standing instruction 5x from various blue mat height surfaces. Dynamic reaching for cones on ground and on blue airex pads, 6 cones L/R x 2.  Resisted walking in all 4 planes with CGA x 5 reps. Pt then ambulated to the  chair with SBA and SPC.   Medical necessity for treatment:Pt requires cues with gait training for fluid pattern. Pt requires balance verbal cues for sit to stand to promote anterior weight shift.  Pt requires cuing for breathing with all exercises.          PT Long Term Goals - 09/04/14 1940    PT LONG TERM GOAL #1   Title Pt. I with HEP to increase B LE muscle strength to grossly 4+/5 MMT to improve balance/ safety with gait.     Time 6   Period Weeks   Status New   PT LONG TERM GOAL #2   Title Pt. will increase Berg balance test to >48/56 to improve balance/ safety with gait.     Baseline 41/56 on 09/03/14   Time 6   Period Weeks   Status New   PT LONG TERM GOAL #3   Title Pt. able to tolerate 30 minutes of ther.ex. with no rest breaks safely.     Time 6   Period Weeks   Status New   PT LONG TERM GOAL #4   Title Pt. will ambulate with normalized gait pattern wtih use of SPC and proper 2-point gait pattern to improve independence.     Time 6   Period Weeks   Status New  Problem List There are no active problems to display for this patient.  Pura Spice, PT, DPT # [phone]   09/17/2014, 4:46 PM  [facility] [address] [place], [place], [address] Phone: [phone]   Fax:  [phone]

## 2014-09-18 ENCOUNTER — Encounter: Payer: PPO | Admitting: Physical Therapy

## 2014-09-22 ENCOUNTER — Ambulatory Visit: Payer: PPO | Admitting: Physical Therapy

## 2014-09-22 DIAGNOSIS — R269 Unspecified abnormalities of gait and mobility: Secondary | ICD-10-CM

## 2014-09-22 DIAGNOSIS — M6281 Muscle weakness (generalized): Secondary | ICD-10-CM

## 2014-09-22 NOTE — Therapy (Signed)
[place] [facility] [address] [place], [place], [address] Phone: [phone]   Fax:  [phone]  Physical Therapy Treatment  Patient Details  Name: Daniel Eaton MRN: [id number] Date of Birth: [birth year] Referring Provider:  [name], MD  Encounter Date: 09/22/2014      PT End of Session - 09/22/14 1454    Visit Number 5   Number of Visits 12   Date for PT Re-Evaluation 10/15/14   Authorization - Visit Number 5   Authorization - Number of Visits 10   PT Start Time 1110   PT Stop Time 8250   PT Time Calculation (min) 54 min   Equipment Utilized During Treatment Gait belt   Activity Tolerance Patient tolerated treatment well;Patient limited by fatigue   Behavior During Therapy [facility] for tasks assessed/performed      No past medical history on file.  No past surgical history on file.  There were no vitals filed for this visit.  Visit Diagnosis:  Gait difficulty  Muscle weakness      Subjective Assessment - 09/22/14 1452    Subjective Pt rpeorts no pain and not being active over the weekend due to fatigue from 2 PT tx sessions and 2 forever fit session.    Limitations Lifting;Standing;Walking   Patient Stated Goals Increase LE muscle strength/ improve walking/ balance.    Currently in Pain? No/denies      Weight: 320.2#    Objective: Gait training: Amb. With consistent recip. Step through gait pattern with normal BOS and use of SPC. 2-point gait pattern with cuing to increase hip/ knee flexion to improve stride length for 2 min. 25 sec, required one standing rest break at 1 minute and 5 seconds. (HR 106 bpm/ 98 O2 sat.). Gait in the parallel bars with no UE support F/B, focused on increased hip flexion and extension. Gait training on uneven surfaces and curbs from the side gym door to his car with inclines using his SPC (no LOB) There.ex: Step ups on 6" step with L LE 10x and light touch to min A on one parallel  bars (no R LE step ups secondary to knee pain/ issues). Step overs forward and horizontal over 6" 10 each direction. Standing marching exercises with min A on handrails (one near LOB, but pt was able to correct with use of parallel bars). Step up/down with SPC x 10.  Medical necessity for treatment:Pt requires cues with gait training for fluid pattern.  Pt requires cuing for breathing with all exercises and reassurance that he isn't hyperventaliting.       PT Education - 09/22/14 1453    Education provided Yes   Education Details use of single point cane to promote functional independence and a continous gait pattern    Person(s) Educated Patient   Methods Explanation   Comprehension Verbalized understanding            PT Long Term Goals - 09/04/14 1940    PT LONG TERM GOAL #1   Title Pt. I with HEP to increase B LE muscle strength to grossly 4+/5 MMT to improve balance/ safety with gait.     Time 6   Period Weeks   Status New   PT LONG TERM GOAL #2   Title Pt. will increase Berg balance test to >48/56 to improve balance/ safety with gait.     Baseline 41/56 on 09/03/14   Time 6   Period Weeks   Status New  PT LONG TERM GOAL #3   Title Pt. able to tolerate 30 minutes of ther.ex. with no rest breaks safely.     Time 6   Period Weeks   Status New   PT LONG TERM GOAL #4   Title Pt. will ambulate with normalized gait pattern wtih use of SPC and proper 2-point gait pattern to improve independence.     Time 6   Period Weeks   Status New            Plan - 09/22/14 1454    Clinical Impression Statement Pt required fewer seated rest breaks today and was able to perform 30 minutes of continous activity in the parallel bars. Pt was able to ambulate for 2: 23 with SPC before needing to sit down. Pt required one standing rest break with gait. Pt continues to have good carryover of 2 point gait with SPC. Pt continues to be limited by R knee pain.    Pt will benefit  from skilled therapeutic intervention in order to improve on the following deficits Abnormal gait;Decreased endurance;Impaired sensation;Obesity;Increased edema;Cardiopulmonary status limiting activity;Decreased activity tolerance;Decreased knowledge of use of DME;Decreased strength;Difficulty walking;Decreased mobility;Decreased balance;Decreased range of motion;Improper body mechanics;Postural dysfunction;Decreased safety awareness;Decreased coordination   Rehab Potential Good   PT Frequency 2x / week   PT Duration 6 weeks   PT Treatment/Interventions ADLs/Self Care Home Management;Neuromuscular re-education;Aquatic Therapy;Gait training;Balance training;Therapeutic exercise;Therapeutic activities;Manual techniques;Functional mobility training;Stair training;Energy conservation   PT Next Visit Plan progress length of gait with SPC to increase functional mobility independence    PT Home Exercise Plan continue with current progression    Consulted and Agree with Plan of Care Patient        Problem List There are no active problems to display for this patient.  Pura Spice, PT, DPT # [phone]   09/22/2014, 5:13 PM  [facility] [address] [place], [place], [address] Phone: [phone]   Fax:  [phone]

## 2014-09-23 ENCOUNTER — Encounter: Payer: PPO | Admitting: Physical Therapy

## 2014-09-24 ENCOUNTER — Ambulatory Visit: Payer: PPO | Admitting: Physical Therapy

## 2014-09-24 ENCOUNTER — Encounter: Payer: Self-pay | Admitting: Physical Therapy

## 2014-09-24 DIAGNOSIS — R269 Unspecified abnormalities of gait and mobility: Secondary | ICD-10-CM | POA: Diagnosis not present

## 2014-09-24 DIAGNOSIS — M6281 Muscle weakness (generalized): Secondary | ICD-10-CM

## 2014-09-24 NOTE — Therapy (Signed)
[facility] [address] [place], [place], [address] Phone: [phone]   Fax:  [phone]  Physical Therapy Treatment  Patient Details  Name: Daniel Eaton MRN: [id number] Date of Birth: 1940-06-10 Referring Provider:  [name], MD  Encounter Date: 09/24/2014      PT End of Session - 09/24/14 1338    Visit Number 6   Number of Visits 12   Date for PT Re-Evaluation 10/15/14   Authorization - Visit Number 6   Authorization - Number of Visits 10   PT Start Time 6962   PT Stop Time 1211   PT Time Calculation (min) 57 min   Equipment Utilized During Treatment Gait belt   Activity Tolerance Patient tolerated treatment well;Patient limited by fatigue   Behavior During Therapy [facility] for tasks assessed/performed      History reviewed. No pertinent past medical history.  History reviewed. No pertinent past surgical history.  There were no vitals filed for this visit.  Visit Diagnosis:  Gait difficulty  Muscle weakness      Subjective Assessment - 09/24/14 1338    Subjective Pt denies any pain. Pt reports 25 minutes at L7 on [place] and 5 min at 0.5 mph marching at [employer] yesterday.    Limitations Lifting;Standing;Walking   Patient Stated Goals Increase LE muscle strength/ improve walking/ balance.    Currently in Pain? No/denies       OBJECTIVE: Neuro re-ed: in // bars, semi tandem stance on airex pads with UE dynamic reaching for cones and ball catches. In // bars, forward and backwards walking with tandem stance.  Sit to stands from bariatric chair (4 out of 5 with no UE assist).  Gait training: with SPC, gait on uneven terrain and level surfaces. Level surfaces with head turns and searching for numbers on wall. Pt able to vary cadence with SPC.  Outside ambulation on declining ramp to/from car with SPC only.  Cuing to increase hip/knee flexion and consistent heel strike and step pattern.      Pt response  for medical necessity: Pt continues to progress endurance and balance. Pt able to compensate appropriately to balance challenges today.  SBA/CGA with use of SPC outside.          PT Long Term Goals - 09/04/14 1940    PT LONG TERM GOAL #1   Title Pt. I with HEP to increase B LE muscle strength to grossly 4+/5 MMT to improve balance/ safety with gait.     Time 6   Period Weeks   Status New   PT LONG TERM GOAL #2   Title Pt. will increase Berg balance test to >48/56 to improve balance/ safety with gait.     Baseline 41/56 on 09/03/14   Time 6   Period Weeks   Status New   PT LONG TERM GOAL #3   Title Pt. able to tolerate 30 minutes of ther.ex. with no rest breaks safely.     Time 6   Period Weeks   Status New   PT LONG TERM GOAL #4   Title Pt. will ambulate with normalized gait pattern wtih use of SPC and proper 2-point gait pattern to improve independence.     Time 6   Period Weeks   Status New           Plan - 09/24/14 1340    Clinical Impression Statement Pt ambulates with step through 2 point reciprocal gait pattern with  SPC and SBA/CGA for safety throughout tx session. Pt was able to correct all feelings of unsteadiness on airex pads with appropriate balance responses. Pt required more seated rest breaks but was able to ambulate for futher distances before requiring a rest break.    Pt will benefit from skilled therapeutic intervention in order to improve on the following deficits Abnormal gait;Decreased endurance;Impaired sensation;Obesity;Increased edema;Cardiopulmonary status limiting activity;Decreased activity tolerance;Decreased knowledge of use of DME;Decreased strength;Difficulty walking;Decreased mobility;Decreased balance;Decreased range of motion;Improper body mechanics;Postural dysfunction;Decreased safety awareness;Decreased coordination   Rehab Potential Good   PT Frequency 2x / week   PT Duration 6 weeks   PT Treatment/Interventions ADLs/Self Care Home  Management;Neuromuscular re-education;Aquatic Therapy;Gait training;Balance training;Therapeutic exercise;Therapeutic activities;Manual techniques;Functional mobility training;Stair training;Energy conservation   PT Next Visit Plan progress length of gait with SPC to increase functional mobility independence    PT Home Exercise Plan continue with current progression    Recommended Other Services aquatic   Consulted and Agree with Plan of Care Patient      Problem List There are no active problems to display for this patient.  Pura Spice, PT, DPT # [phone]   09/24/2014, 2:01 PM  [facility] [address] [place], [place], [address] Phone: [phone]   Fax:  [phone]

## 2014-09-25 ENCOUNTER — Encounter: Payer: PPO | Admitting: Physical Therapy

## 2014-09-29 ENCOUNTER — Ambulatory Visit: Payer: PPO | Admitting: Physical Therapy

## 2014-09-29 DIAGNOSIS — R269 Unspecified abnormalities of gait and mobility: Secondary | ICD-10-CM

## 2014-09-29 DIAGNOSIS — M6281 Muscle weakness (generalized): Secondary | ICD-10-CM

## 2014-09-29 NOTE — Therapy (Signed)
[facility] [address] [place], [place], [address] Phone: [phone]   Fax:  [phone]  Physical Therapy Treatment  Patient Details  Name: Daniel Eaton MRN: [id number] Date of Birth: 07/15/1940 Referring Provider:  [name], MD  Encounter Date: 09/29/2014      PT End of Session - 09/29/14 1200    Visit Number 7   Number of Visits 12   Date for PT Re-Evaluation 10/15/14   Authorization - Visit Number 7   Authorization - Number of Visits 10   PT Start Time 2637   PT Stop Time 1200   PT Time Calculation (min) 32 min   Equipment Utilized During Treatment Gait belt   Activity Tolerance Patient tolerated treatment well;Patient limited by fatigue   Behavior During Therapy [facility] for tasks assessed/performed      No past medical history on file.  No past surgical history on file.  There were no vitals filed for this visit.  Visit Diagnosis:  Gait difficulty  Muscle weakness      Subjective Assessment - 09/29/14 1158    Subjective No new complatins.  Pt. reports walking around house with less assist but furniture support.  No falls or LOB reported.  Pt. weighed in at 317.6# (2.5# improvement since last PT visit).     Limitations Lifting;Standing;Walking   Patient Stated Goals Increase LE muscle strength/ improve walking/ balance.    Currently in Pain? No/denies      OBJECTIVE: Gait training: Walking up ramps and on concrete with SPC and CGA with reciprocal gait pattern. Ambulation in the hallway x 4 with emphasis on keeping his head upright, heel strike and increased hip flexion with SPC and CGA. Gait pattern with SPC and CGA following 2 pieces of tape to make a zig-zag pattern (forwards and backwards). Neuro re-ed: Balance in semi-tandem stance on airex pads with dynamic reaching and stacking of cones across midline. No loss of balance and proper correction of weight shifting when needed. No UE support used  during balance. Semi-tandem stance with eyes closed on foam 3 x 20 seconds. Balance on airex while looking up 3 x 30 seconds.  Step ups with L LE only, no UE support Forwards and horizontal x 10 each.    Pt response for medical necessity: Pt continues to progress with endurance and gait. Continue to encourage pt to use SPC at home for mobility. Continue to increase LE strength to facilitate gait and steadiness with SPC.          PT Long Term Goals - 09/04/14 1940    PT LONG TERM GOAL #1   Title Pt. I with HEP to increase B LE muscle strength to grossly 4+/5 MMT to improve balance/ safety with gait.     Time 6   Period Weeks   Status New   PT LONG TERM GOAL #2   Title Pt. will increase Berg balance test to >48/56 to improve balance/ safety with gait.     Baseline 41/56 on 09/03/14   Time 6   Period Weeks   Status New   PT LONG TERM GOAL #3   Title Pt. able to tolerate 30 minutes of ther.ex. with no rest breaks safely.     Time 6   Period Weeks   Status New   PT LONG TERM GOAL #4   Title Pt. will ambulate with normalized gait pattern wtih use of SPC and proper 2-point gait pattern to improve  independence.     Time 6   Period Weeks   Status New             Plan - 09/29/14 1200    Clinical Impression Statement Pt able to maintain or appropriately correct balance on airex pads in semitandem stance without UE support. Pt is able to consistently ambulate with a 2 point step through gait pattern on level and concrete outside. Pt still requires verbal cueing for arm swing, head alignment and increased heel strike. Pt is able to dmeonstrate increaess in endurance by standing for longer with more exercises and taking standing rest breaks instead of seated ones.    Pt will benefit from skilled therapeutic intervention in order to improve on the following deficits Abnormal gait;Decreased endurance;Impaired sensation;Obesity;Increased edema;Cardiopulmonary status limiting activity;Decreased  activity tolerance;Decreased knowledge of use of DME;Decreased strength;Difficulty walking;Decreased mobility;Decreased balance;Decreased range of motion;Improper body mechanics;Postural dysfunction;Decreased safety awareness;Decreased coordination   Rehab Potential Good   PT Frequency 2x / week   PT Duration 6 weeks   PT Treatment/Interventions ADLs/Self Care Home Management;Neuromuscular re-education;Aquatic Therapy;Gait training;Balance training;Therapeutic exercise;Therapeutic activities;Manual techniques;Functional mobility training;Stair training;Energy conservation   PT Next Visit Plan continue to gait train with [facility] for all functional mobilty   PT Home Exercise Plan continue with forever fit   Recommended Other Services aquatic   Consulted and Agree with Plan of Care Patient        Problem List There are no active problems to display for this patient.  Pura Spice, PT, DPT # [phone]   09/29/2014, 12:20 PM  [facility] [address] [place], [place], [address] Phone: [phone]   Fax:  [phone]

## 2014-09-30 ENCOUNTER — Encounter: Payer: PPO | Admitting: Physical Therapy

## 2014-10-01 ENCOUNTER — Encounter: Payer: Self-pay | Admitting: Physical Therapy

## 2014-10-01 ENCOUNTER — Ambulatory Visit: Payer: PPO | Admitting: Physical Therapy

## 2014-10-01 DIAGNOSIS — R269 Unspecified abnormalities of gait and mobility: Secondary | ICD-10-CM

## 2014-10-01 DIAGNOSIS — M6281 Muscle weakness (generalized): Secondary | ICD-10-CM

## 2014-10-01 NOTE — Therapy (Signed)
[facility] [address] [place], [place], [address] Phone: [phone]   Fax:  [phone]  Physical Therapy Treatment  Patient Details  Name: Daniel Eaton MRN: [id number] Date of Birth: 09-Mar-1940 Referring Provider:  [name], MD  Encounter Date: 10/01/2014      PT End of Session - 10/01/14 1146    Visit Number 8   Number of Visits 12   Date for PT Re-Evaluation 10/15/14   Authorization - Visit Number 8   Authorization - Number of Visits 10   PT Start Time 2202   PT Stop Time 1200   PT Time Calculation (min) 36 min   Equipment Utilized During Treatment Gait belt   Activity Tolerance Patient tolerated treatment well;Patient limited by fatigue   Behavior During Therapy [facility] for tasks assessed/performed      History reviewed. No pertinent past medical history.  History reviewed. No pertinent past surgical history.  There were no vitals filed for this visit.  Visit Diagnosis:  Gait difficulty  Muscle weakness      Subjective Assessment - 10/01/14 1147    Subjective Patient arrives using [facility], he states he has been consistently going to [employer] and has been noticing improvements in his breathing. He has intermittently completed the sitting exercises, but has not been as good with standing balance exercises.  His weight today was 318.5#.   Limitations Lifting;Standing;Walking   Patient Stated Goals Increase LE muscle strength/ improve walking/ balance.    Currently in Pain? No/denies        OBJECTIVE: BERG 43/56 Neuromuscular re-ed: Marching in // bars with emphasis on SLS (pt required B UE support to complete). Pt able to march for 1 minute without a rest break. On blue airex EC and EO normal base of support balance; no episodes of loss of balance noted. Pt had mild postural sway but able to correct with proper weight shifts. On blue airex in // bars, pt threw and caught a ball while performing heel  raises/eyes closed/weight shifts/marching x 5 reps each. Pt ambulated over concrete back to his car with noted fatigue during gait. Pt ambulates with a fluid 2 point step through gait pattern but still requires cuing for increased heel strike when walking down a curb/ramp. Pt is still unsure of balance on unlevel surfaces. No loss of balance noted.          PT Education - 10/01/14 1150    Education provided Yes   Education Details Continued use of SPC, maintenance with fitness programs he is currently with, and the need to perform HEP provided today to increase his balance.    Person(s) Educated Patient   Methods Explanation;Handout   Comprehension Verbalized understanding;Returned demonstration          PT Long Term Goals - 10/01/14 1126    PT LONG TERM GOAL #1   Title Pt. I with HEP to increase B LE muscle strength to grossly 4+/5 MMT to improve balance/ safety with gait.     Baseline Hip flexion 3/5 bilaterally    Time 6   Period Weeks   Status On-going   PT LONG TERM GOAL #2   Title Pt. will increase Berg balance test to >48/56 to improve balance/ safety with gait.     Baseline 41/56 on 09/03/14, 43/56 on 8/17   Time 6   Period Weeks   Status Partially Met   PT LONG TERM GOAL #3   Title Pt.  able to tolerate 30 minutes of ther.ex. with no rest breaks safely.     Baseline Patient able to work out for 5 minutes on bike as warm up, 20 minutes on bike for main set at 50W, and 5 mins on TM at 1 mph   Time 6   Period Weeks   Status Achieved   PT LONG TERM GOAL #4   Title Pt. will ambulate with normalized gait pattern wtih use of SPC and proper 2-point gait pattern to improve independence.     Baseline Patient ambulating with minimal hip extension and Trendelenburg bilaterally with SPC   Time 6   Period Weeks   Status Partially Met            Plan - 10/10/14 1356    Clinical Impression Statement Pt ambulated with SPC into PT tx session today. Pt continues to improve his  balance and improved his BERG to a 43/56. Fatigue continues to be a limiting factor with balance and gait tasks. Pt was able to maintain balance on airex with various tasks pushing him outside of his base of support with no loss of balance episodes. Pt continues to complain about R knee pain with standing activities especially single leg stance. Pt stands with rounded shoulders and forward head but is able to correct with verbal, visual and tactile cues.    Pt will benefit from skilled therapeutic intervention in order to improve on the following deficits Abnormal gait;Decreased endurance;Impaired sensation;Obesity;Increased edema;Cardiopulmonary status limiting activity;Decreased activity tolerance;Decreased knowledge of use of DME;Decreased strength;Difficulty walking;Decreased mobility;Decreased balance;Decreased range of motion;Improper body mechanics;Postural dysfunction;Decreased safety awareness;Decreased coordination   Rehab Potential Good   PT Frequency 2x / week   PT Duration 6 weeks   PT Treatment/Interventions ADLs/Self Care Home Management;Neuromuscular re-education;Aquatic Therapy;Gait training;Balance training;Therapeutic exercise;Therapeutic activities;Manual techniques;Functional mobility training;Stair training;Energy conservation   PT Home Exercise Plan continue with forever fit and encouraged to use cane for all household ambulation. Pt given handout on standing exercises (marching/toe raises/hip abduction)   Recommended Other Services Aquatic    Consulted and Agree with Plan of Care Patient          G-Codes - 2014-10-10 1541    Functional Assessment Tool Used Berg balance test/ gait pattern/ clinical impression   Functional Limitation Mobility: Walking and moving around   Mobility: Walking and Moving Around Current Status [phone]) At least 20 percent but less than 40 percent impaired, limited or restricted   Mobility: Walking and Moving Around Goal Status [phone]) At least 1  percent but less than 20 percent impaired, limited or restricted      Problem List There are no active problems to display for this patient.  Kerman Passey, PT, DPT    October 10, 2014, 3:43 PM  [facility] [address] [place], [place], [address] Phone: [phone]   Fax:  [phone]

## 2014-10-02 ENCOUNTER — Encounter: Payer: PPO | Admitting: Physical Therapy

## 2014-10-06 ENCOUNTER — Encounter: Payer: Self-pay | Admitting: Physical Therapy

## 2014-10-06 ENCOUNTER — Ambulatory Visit: Payer: PPO | Admitting: Physical Therapy

## 2014-10-06 DIAGNOSIS — M6281 Muscle weakness (generalized): Secondary | ICD-10-CM

## 2014-10-06 DIAGNOSIS — R269 Unspecified abnormalities of gait and mobility: Secondary | ICD-10-CM

## 2014-10-06 NOTE — Therapy (Signed)
[facility] [address] [place] , [place], [address] Phone: [phone]   Fax:  [phone]  Physical Therapy Treatment  Patient Details  Name: Daniel Eaton MRN: [id number] Date of Birth: [birth year] Referring Provider:  [name], MD  Encounter Date: 10/06/2014      PT End of Session - 10/06/14 1250    Visit Number 9   Number of Visits 12   Date for PT Re-Evaluation 10/15/14   Authorization - Visit Number 9   Authorization - Number of Visits 10   PT Start Time 5170   PT Stop Time 1210   PT Time Calculation (min) 47 min   Equipment Utilized During Treatment Gait belt   Activity Tolerance Patient tolerated treatment well;Patient limited by pain;Patient limited by fatigue   Behavior During Therapy [facility] for tasks assessed/performed      History reviewed. No pertinent past medical history.  History reviewed. No pertinent past surgical history.  There were no vitals filed for this visit.  Visit Diagnosis:  No diagnosis found.      Subjective Assessment - 10/06/14 1247    Subjective Patient arrives with SPC. Patient reports walking with SPC over the weekend in public. Patient reports walking and doing seated HEP exercises but none of the standing ones due to a fear of falling. Patient complains of soreness in his R knee today but denies any injury. Patients weight today was 120#.   Limitations Walking;Standing;Lifting   Patient Stated Goals increase LE strength/balance/walking   Currently in Pain? No/denies      OBJECTIVE: Gait training: Walking through the grass with SPC and CGA focusing on reciprocal gait pattern and equal step length. Ambulation down curbs and ramps from PT gym door to car with SPC and CGA. For both, noted decrease in gait speed and pt felt unsteady.  Neuro re-ed: Balance in normal base of support on airex pads with head tilts to the ceiling 3 x 30 seconds. Marching on airex pad 2 x 1 minute. No  loss of balance and proper correction of weight shifting when needed. No UE support used during balance. There ex: Wall squats attempted but unable to complete secondary to c/o R knee pain. Squats in the // bars with B UE support x 30, slight knee bend but limited in depth due to c/o R knee pain. Step overs 6" step without UE support x 10.  Discussed [employer] program.   Pt response for medical necessity: Pt continues to progress with endurance and gait. Pt ambulating some community distances with [facility] but limited in the home to RW due to needing to carry objects with him. Continue to increase LE strength to facilitate gait and steadiness with SPC.         PT Long Term Goals - 10/01/14 1126    PT LONG TERM GOAL #1   Title Pt. I with HEP to increase B LE muscle strength to grossly 4+/5 MMT to improve balance/ safety with gait.     Baseline Hip flexion 3/5 bilaterally    Time 6   Period Weeks   Status On-going   PT LONG TERM GOAL #2   Title Pt. will increase Berg balance test to >48/56 to improve balance/ safety with gait.     Baseline 41/56 on 09/03/14, 43/56 on 8/17   Time 6   Period Weeks   Status Partially Met   PT LONG TERM GOAL #3   Title Pt. able to tolerate 30  minutes of ther.ex. with no rest breaks safely.     Baseline Patient able to work out for 5 minutes on bike as warm up, 20 minutes on bike for main set at 50W, and 5 mins on TM at 1 mph   Time 6   Period Weeks   Status Achieved   PT LONG TERM GOAL #4   Title Pt. will ambulate with normalized gait pattern wtih use of SPC and proper 2-point gait pattern to improve independence.     Baseline Patient ambulating with minimal hip extension and Trendelenburg bilaterally with SPC   Time 6   Period Weeks   Status Partially Met            Plan - 10/06/14 1252    Clinical Impression Statement Patient ambulated on concrete, grass and carpet surfaces with SPC and no loss of balance noted. Patient continues to be  limited by fatigue and fear of falling with all standing activities. Patient c/o of R knee pain with any standing knee flexion exercises. Patient tolerated standing balance challenges on airex with various foot positions without UE support and no loss of balance. Patient is progressing to using less UE support to complete tasks.    Pt will benefit from skilled therapeutic intervention in order to improve on the following deficits Abnormal gait;Decreased activity tolerance;Decreased balance;Decreased mobility;Decreased strength;Hypomobility;Decreased endurance;Difficulty walking;Decreased range of motion;Decreased coordination;Obesity;Increased edema;Pain   Rehab Potential Good   PT Frequency 2x / week   PT Duration 4 weeks   PT Treatment/Interventions ADLs/Self Care Home Management;Cryotherapy;Moist Heat;Neuromuscular re-education;Patient/family education;Stair training;Gait training;Functional mobility training;Therapeutic activities;Therapeutic exercise;Balance training;Manual techniques;Passive range of motion   PT Next Visit Plan Continue to work towards LE strengthening/ progress mini-squats with range; attempt single leg balance; progress gait challenges   PT Home Exercise Plan encouraged to try standing exercises and continue with forever fit program   Consulted and Agree with Plan of Care Patient        Problem List There are no active problems to display for this patient.   Lavone Neri, SPT 10/06/2014, 1:02 PM  [facility] [address] [place], [place], [address] Phone: [phone]   Fax:  [phone]

## 2014-10-08 ENCOUNTER — Ambulatory Visit: Payer: PPO | Admitting: Physical Therapy

## 2014-10-08 DIAGNOSIS — R269 Unspecified abnormalities of gait and mobility: Secondary | ICD-10-CM | POA: Diagnosis not present

## 2014-10-08 DIAGNOSIS — M6281 Muscle weakness (generalized): Secondary | ICD-10-CM

## 2014-10-08 NOTE — Therapy (Signed)
[facility] [address] [place], [place], [address] Phone: [phone]   Fax:  [phone]  Physical Therapy Treatment  Patient Details  Name: REGGIE BISE MRN: [id number] Date of Birth: 1941-01-13 Referring Provider:  [name], MD  Encounter Date: 10/08/2014      PT End of Session - 10/08/14 1720    Visit Number 10   Number of Visits 12   Date for PT Re-Evaluation 10/14/14   Authorization - Visit Number 10   Authorization - Number of Visits 10   PT Start Time 9924   PT Stop Time 1219   PT Time Calculation (min) 51 min   Equipment Utilized During Treatment Gait belt   Activity Tolerance Patient tolerated treatment well;Patient limited by pain;Patient limited by fatigue   Behavior During Therapy [facility] for tasks assessed/performed      No past medical history on file.  No past surgical history on file.  There were no vitals filed for this visit.  Visit Diagnosis:  Gait difficulty  Muscle weakness      Subjective Assessment - 10/08/14 1719    Subjective Pt arrives with SPC and ambulated into gym with encouragement on unlevel surfaces outside. Pt reports being sore in his legs after forever fit yesterday. Pt reports doing 20 minutes at L8 on the Nustep and 10 mins at 1.0 on the TM. Pt weight: 321.4#.   Limitations Lifting;Standing;Walking   Patient Stated Goals increase LE strength/balance/gait    Currently in Pain? No/denies        OBJECTIVE: Gait training: ambulation with CGA from car across grass into PT gym with SPC (3 rest breaks standing, 1 seated). Ambulation in hallway with increased cadence that continued until reaching the sidewalk; ambulation down the curb to his car at the end of the session. There ex: step ups with SPC  On 6" step x 15. Resisted gait all 4 planes x 4 each (c/o L LE weakness with side stepping to the R). Squats in // bars with B UE support x 20. Sit to stand transfers from bariatric  chair. Standing balance with dynamic reaching/catching of yellow ball x 3 minutes.   Pt response for medical necessity: Pt continues to benefit from gait training to address unsteadiness with SPC. Pt continues to progress with LE strengthening to increase balance and gait.          PT Long Term Goals - 10/01/14 1126    PT LONG TERM GOAL #1   Title Pt. I with HEP to increase B LE muscle strength to grossly 4+/5 MMT to improve balance/ safety with gait.     Baseline Hip flexion 3/5 bilaterally    Time 6   Period Weeks   Status On-going   PT LONG TERM GOAL #2   Title Pt. will increase Berg balance test to >48/56 to improve balance/ safety with gait.     Baseline 41/56 on 09/03/14, 43/56 on 8/17   Time 6   Period Weeks   Status Partially Met   PT LONG TERM GOAL #3   Title Pt. able to tolerate 30 minutes of ther.ex. with no rest breaks safely.     Baseline Patient able to work out for 5 minutes on bike as warm up, 20 minutes on bike for main set at 50W, and 5 mins on TM at 1 mph   Time 6   Period Weeks   Status Achieved   PT LONG TERM GOAL #4  Title Pt. will ambulate with normalized gait pattern wtih use of SPC and proper 2-point gait pattern to improve independence.     Baseline Patient ambulating with minimal hip extension and Trendelenburg bilaterally with SPC   Time 6   Period Weeks   Status Partially Met           Plan - 10/08/14 1721    Clinical Impression Statement Pt ambulated with SPC over concrete ramps, grass and carpet. Pt continues to ambulate with a step to gait pattern and decrease step length B. Pt demonstrated the ability for variable cadence today on carpet. Pt is still unsure and reports feeling unsteady on unlevel surfaces. Pt was limited in activity tolerance by L LE weakness and soreness. Pt continues to benefit from strengthening and gait training with SPC.   Pt will benefit from skilled therapeutic intervention in order to improve on the following deficits  Abnormal gait;Decreased activity tolerance;Decreased balance;Decreased mobility;Increased edema;Decreased strength;Hypomobility;Improper body mechanics;Decreased endurance;Pain;Difficulty walking;Decreased range of motion   Rehab Potential Good   PT Frequency 2x / week   PT Duration 4 weeks   PT Treatment/Interventions ADLs/Self Care Home Management;Cryotherapy;Moist Heat;Balance training;Therapeutic exercise;Manual techniques;Therapeutic activities;Functional mobility training;Stair training;Gait training;Neuromuscular re-education;Patient/family education;Passive range of motion   PT Next Visit Plan REASSESS, balance strength progression    PT Home Exercise Plan pt encouraged to perform HEP over the weekend--moderate compliance overall with HEP    Recommended Other Services forever fit     Consulted and Agree with Plan of Care Patient        Problem List There are no active problems to display for this patient.   Lavone Neri, SPT  10/08/2014, 5:25 PM  [place] [facility] [address] [place], [place], [address] Phone: [phone]   Fax:  [phone]

## 2014-10-13 ENCOUNTER — Encounter: Payer: Self-pay | Admitting: Physical Therapy

## 2014-10-13 ENCOUNTER — Ambulatory Visit: Payer: PPO | Admitting: Physical Therapy

## 2014-10-13 DIAGNOSIS — M6281 Muscle weakness (generalized): Secondary | ICD-10-CM

## 2014-10-13 DIAGNOSIS — R269 Unspecified abnormalities of gait and mobility: Secondary | ICD-10-CM

## 2014-10-13 NOTE — Therapy (Signed)
[facility] [address] [place], [place], [address] Phone: [phone]   Fax:  [phone]  Physical Therapy Treatment  Patient Details  Name: Daniel Eaton MRN: [id number] Date of Birth: 1940/04/01 Referring Provider:  [name], MD  Encounter Date: 10/13/2014      PT End of Session - 10/13/14 1718    Visit Number 11   Number of Visits 20   Date for PT Re-Evaluation 11/05/14   Authorization - Visit Number 11   Authorization - Number of Visits 18   PT Start Time 9924   PT Stop Time 1203   PT Time Calculation (min) 45 min   Equipment Utilized During Treatment Gait belt   Activity Tolerance Patient tolerated treatment well;Patient limited by pain;Patient limited by fatigue   Behavior During Therapy [facility] for tasks assessed/performed      History reviewed. No pertinent past medical history.  History reviewed. No pertinent past surgical history.  There were no vitals filed for this visit.  Visit Diagnosis:  Gait difficulty  Muscle weakness      Subjective Assessment - 10/13/14 1705    Subjective Pt reports not doing much over the weekend. Pt states that Thursday's forever fit session went ok but he had to stop treadmill early and had difficulty keeping his steps up on the Nustep. Pt states he has extra fluid especially in his L leg. Weight 121.6#.   Limitations Walking;Standing;Lifting   Patient Stated Goals increase LE strength/balance/gait   Currently in Pain? No/denies      OBJECTIVE: Gait training: Ambulating over grass and sidewalk with inclines with SPC and CGA. (encouragement to keep going and not stop after every step required especially with the grass). At the end of tx session, pt ambulated around the entire building outside with CGA and SPC with less cuing until the very end when he started scuffing his foot. Neuro re-ed: Cross body punches on airex with normal base of support x 20. On airex: cross body  punches/bicep curls/shoulder press/scaption/horizontal abduction with 2# weights x 20 each. There ex: partial lunges over a 3" step x 15 each leg.   Pt response to tx for medical necessity: Pt continues to progress with balance training and gait to ambulate safely and confidently with [facility] for household distances. Goal to progress pt to entering forever fit with cane/increase community distances and endurance with SPC.        PT Education - 10/13/14 1706    Education provided Yes   Education Details Pt educated on use of SPC to enter [employer] and RW to leave session and during the session.   Person(s) Educated Patient   Methods Explanation   Comprehension Verbalized understanding             PT Long Term Goals - 10/08/14 1742    PT LONG TERM GOAL #1   Title Pt. I with HEP to increase B LE muscle strength to grossly 4+/5 MMT to improve balance/ safety with gait.     Baseline Hip flexion 3/5 bilaterally    Time 4   Period Weeks   Status Not Met   PT LONG TERM GOAL #2   Title Pt. will increase Berg balance test to >48/56 to improve balance/ safety with gait.     Baseline 41/56 on 09/03/14, 43/56 on 8/17   Time 4   Period Weeks   Status Not Met   PT LONG TERM GOAL #3   Title  Pt. able to tolerate 30 minutes of ther.ex. with no rest breaks safely.     Baseline Patient able to work out for 5 minutes on bike as warm up, 20 minutes on bike for main set at 50W, and 5 mins on TM at 1 mph   Time 4   Period Weeks   Status Achieved   PT LONG TERM GOAL #4   Title Pt. will ambulate with normalized gait pattern wtih use of SPC and proper 2-point gait pattern to improve independence.     Baseline Patient ambulating with minimal hip extension and Trendelenburg bilaterally with SPC   Time 4   Period Weeks   Status Partially Met               Plan - 10/13/14 1720    Clinical Impression Statement Pt ambulates at 1.0 MPH on the treadmill for 6 mins for a total of 0.9 miles. Pt able  to progress with more balance activities prior to needing a rest break. Pt ambulates with SPC and SBA/mod. I around entire outside of PT building safely.  Pt continues to complain of unsteadiness with gait on grass. Pt is limited with standing activites due to R knee pain. Pt continues to benefit from skilled PT to address endurance, balance and gait.    Pt will benefit from skilled therapeutic intervention in order to improve on the following deficits Abnormal gait;Decreased activity tolerance;Decreased balance;Decreased strength;Increased edema;Pain;Improper body mechanics;Hypomobility;Impaired flexibility;Decreased endurance;Decreased coordination;Difficulty walking;Obesity   Rehab Potential Good   PT Frequency 2x / week   PT Duration 4 weeks   PT Treatment/Interventions ADLs/Self Care Home Management;Cryotherapy;Moist Heat;Neuromuscular re-education;Gait training;Functional mobility training;Stair training;Therapeutic activities;Therapeutic exercise;Balance training;Manual techniques   PT Next Visit Plan Progress balance training/incorporate more closed chain strengthening.    PT Home Exercise Plan Continue with forever fit and encouraged to perform HEP over the weekend.    Consulted and Agree with Plan of Care Patient        Problem List There are no active problems to display for this patient.   Lavone Neri, SPT  10/14/2014, 7:44 AM  [facility] [address] [place], [place], [address] Phone: [phone]   Fax:  [phone]

## 2014-10-15 ENCOUNTER — Encounter: Payer: Self-pay | Admitting: Physical Therapy

## 2014-10-15 ENCOUNTER — Ambulatory Visit: Payer: PPO | Admitting: Physical Therapy

## 2014-10-15 DIAGNOSIS — M6281 Muscle weakness (generalized): Secondary | ICD-10-CM

## 2014-10-15 DIAGNOSIS — R269 Unspecified abnormalities of gait and mobility: Secondary | ICD-10-CM

## 2014-10-15 NOTE — Therapy (Signed)
[facility] [address] [place], [place], [address] Phone: [phone]   Fax:  [phone]  Physical Therapy Treatment  Patient Details  Name: Daniel Eaton MRN: [id number] Date of Birth: 05/28/1940 Referring Provider:  [name], MD  Encounter Date: 10/15/2014      PT End of Session - 10/15/14 1238    Visit Number 12   Number of Visits 20   Date for PT Re-Evaluation 11/05/14   Authorization - Visit Number 12   Authorization - Number of Visits 18   PT Start Time 8413   PT Stop Time 1203   PT Time Calculation (min) 39 min   Equipment Utilized During Treatment Gait belt   Activity Tolerance Patient tolerated treatment well;Patient limited by fatigue   Behavior During Therapy [facility] for tasks assessed/performed      History reviewed. No pertinent past medical history.  History reviewed. No pertinent past surgical history.  There were no vitals filed for this visit.  Visit Diagnosis:  Gait difficulty  Muscle weakness      Subjective Assessment - 10/15/14 1237    Subjective Pt reports not feeling well due to taking an additional diuretic due to increase fluid in his legs. Pt reports no pain and ambulates with SPC. Pt weight 314#.   Limitations Walking;Standing;Lifting   Patient Stated Goals increase LE strength/balance/gait   Currently in Pain? No/denies        OBJECTIVE: There ex: Attempted 6 MWT, pt unable to complete secondary to fatigue. Pt able to walk 204' in 3 minutes. Sled push and pull with 50# x 4 (34' each way). Gait training: use of SPC with 2-point gait pattern and cuing for posture correction/ increase step length in gym out to car.  Discussed use of RW with tray at home and importance of practicing [facility] use.   Pt response to Tx for medical necessity: Pt continues to benefit from balance, strength and gait training to decrease his fear of falling with ambulation. Pt encouraged to be active outside of  PT and use SPC more than just at PT clinic.         PT Long Term Goals - 10/08/14 1742    PT LONG TERM GOAL #1   Title Pt. I with HEP to increase B LE muscle strength to grossly 4+/5 MMT to improve balance/ safety with gait.     Baseline Hip flexion 3/5 bilaterally    Time 4   Period Weeks   Status Not Met   PT LONG TERM GOAL #2   Title Pt. will increase Berg balance test to >48/56 to improve balance/ safety with gait.     Baseline 41/56 on 09/03/14, 43/56 on 8/17   Time 4   Period Weeks   Status Not Met   PT LONG TERM GOAL #3   Title Pt. able to tolerate 30 minutes of ther.ex. with no rest breaks safely.     Baseline Patient able to work out for 5 minutes on bike as warm up, 20 minutes on bike for main set at 50W, and 5 mins on TM at 1 mph   Time 4   Period Weeks   Status Achieved   PT LONG TERM GOAL #4   Title Pt. will ambulate with normalized gait pattern wtih use of SPC and proper 2-point gait pattern to improve independence.     Baseline Patient ambulating with minimal hip extension and Trendelenburg bilaterally with SPC   Time  4   Period Weeks   Status Partially Met               Plan - 10/15/14 1239    Clinical Impression Statement Pt is limited with all tasks due to fatigue from diuretic pill over the past day and a half. Pt reports not having any energy. Attempted the 6 minute walk test but pt fatigued at 3 minutes. At this point pt had reached 204'. Pt able to do minimal gait training with SPC on level and sidewalk today.    Pt will benefit from skilled therapeutic intervention in order to improve on the following deficits Abnormal gait;Decreased activity tolerance;Decreased balance;Decreased strength;Increased edema;Pain;Improper body mechanics;Hypomobility;Impaired flexibility;Decreased endurance;Decreased coordination;Difficulty walking;Obesity   Rehab Potential Good   PT Frequency 2x / week   PT Duration 4 weeks   PT Treatment/Interventions ADLs/Self Care  Home Management;Cryotherapy;Moist Heat;Neuromuscular re-education;Gait training;Functional mobility training;Stair training;Therapeutic activities;Therapeutic exercise;Balance training;Manual techniques   PT Next Visit Plan Progress balance training/incorporate more closed chain strengthening/gait training/6 MWT/reassess goals/BERG   PT Home Exercise Plan Continue with forever fit and encouraged to perform HEP over the weekend.    Recommended Other Services aquatic therapy, active lifestyle/ambulation with the cane outside of PT clinic   Consulted and Agree with Plan of Care Patient        Problem List There are no active problems to display for this patient.  Lavone Neri, SPT  10/15/2014, 6:08 PM  [facility] [facility] [address] [place], [place], [address] Phone: [phone]   Fax:  [phone]

## 2014-10-24 ENCOUNTER — Encounter: Payer: Self-pay | Admitting: Physical Therapy

## 2014-10-24 ENCOUNTER — Ambulatory Visit: Payer: PPO | Attending: Internal Medicine | Admitting: Physical Therapy

## 2014-10-24 DIAGNOSIS — M6281 Muscle weakness (generalized): Secondary | ICD-10-CM | POA: Insufficient documentation

## 2014-10-24 DIAGNOSIS — R269 Unspecified abnormalities of gait and mobility: Secondary | ICD-10-CM | POA: Diagnosis not present

## 2014-10-24 NOTE — Therapy (Signed)
[facility] [address] [place], [place], [address] Phone: [phone]   Fax:  [phone]  Physical Therapy Treatment  Patient Details  Name: Daniel Eaton MRN: [id number] Date of Birth: [birth year] Referring Provider:  [name], MD  Encounter Date: 10/24/2014      PT End of Session - 10/24/14 1238    Visit Number 13   Number of Visits 20   Date for PT Re-Evaluation 11/05/14   Authorization - Visit Number 13   Authorization - Number of Visits 18   PT Start Time 1829   PT Stop Time 1108   PT Time Calculation (min) 45 min   Equipment Utilized During Treatment Gait belt   Activity Tolerance Patient tolerated treatment well;Patient limited by fatigue   Behavior During Therapy [facility] for tasks assessed/performed      History reviewed. No pertinent past medical history.  History reviewed. No pertinent past surgical history.  There were no vitals filed for this visit.  Visit Diagnosis:  Gait difficulty  Muscle weakness      Subjective Assessment - 10/24/14 1236    Subjective Pt reports no pain and ambulating some outside of his house with [facility]. Pt states only using RW inside home because he is fearful of carrying something while walking with the [facility]. Pt enters PT tx with SPC. Pt reports fatigue with all activity and upon arrival. Pt's weight 317#.  Pt did not attend forever fit this week due to MD appt and his wife not being able to attend.    Limitations Walking;Standing;Lifting   Patient Stated Goals increase LE strength/balance/gait   Currently in Pain? No/denies       OBJECTIVE: BERG: 45/56. Gait training: Walking with SPC and SBA with increased cadence and step length while carrying various objects: bag with 10#/plate with food items/cup of water to simulate carrying objects in his home.   Pt response to Tx for medical necessity: Fatigue and labored breathing with gait activities. Difficulty with balance  advanced balance challenges. Improvement in BERG score from last BERG test.         PT Long Term Goals - 10/24/14 1249    PT LONG TERM GOAL #1   Title Pt. I with HEP to increase B LE muscle strength to grossly 4+/5 MMT to improve balance/ safety with gait.     Baseline Hip flexion 3/5 bilaterally    Time 4   Period Weeks   Status Not Met   PT LONG TERM GOAL #2   Title Pt. will increase Berg balance test to >48/56 to improve balance/ safety with gait.     Baseline 41/56 on 09/03/14, 43/56 on 8/17; 45/56 on 9/9   Time 4   Period Weeks   Status Not Met   PT LONG TERM GOAL #3   Title Pt. able to tolerate 30 minutes of ther.ex. with no rest breaks safely.     Baseline Patient able to work out for 5 minutes on bike as warm up, 20 minutes on bike for main set at 50W, and 5 mins on TM at 1 mph   Time 4   Period Weeks   Status Achieved   PT LONG TERM GOAL #4   Title Pt. will ambulate with normalized gait pattern wtih use of SPC and proper 2-point gait pattern to improve independence.     Baseline Patient ambulating with minimal hip extension and Trendelenburg bilaterally with SPC   Time 4  Period Weeks   Status Partially Met            Plan - 10/24/14 1240    Clinical Impression Statement Pt continues to be limited by faitgue and low endurance with all standing activities. BERG: 45/56. Pt ambulates with a SPC and SBA. Pt's gait is a 2 point reciprocal gait pattern with B decreased step length and decreased hip flexion and extension. Pt continues to be fearful of falling with balance challenges and gait activities. Pt is unable to perform tandem stance or single leg stance for the BERG balance test.    Pt will benefit from skilled therapeutic intervention in order to improve on the following deficits Abnormal gait;Decreased activity tolerance;Decreased balance;Decreased strength;Increased edema;Pain;Improper body mechanics;Hypomobility;Impaired flexibility;Decreased endurance;Decreased  coordination;Difficulty walking;Obesity   Rehab Potential Good   PT Frequency 1x / week   PT Duration 3 weeks   PT Treatment/Interventions ADLs/Self Care Home Management;Cryotherapy;Moist Heat;Neuromuscular re-education;Gait training;Functional mobility training;Stair training;Therapeutic activities;Therapeutic exercise;Balance training;Manual techniques   PT Next Visit Plan Balance/change HEP   PT Home Exercise Plan Continue with forever fit and encouraged to perform HEP over the weekend.    Consulted and Agree with Plan of Care Patient        Problem List There are no active problems to display for this patient.  Pura Spice, PT, DPT # [phone]   10/24/2014, 5:08 PM  [place] [facility] [address] [place], [place], [address] Phone: [phone]   Fax:  [phone]

## 2014-10-27 ENCOUNTER — Ambulatory Visit: Payer: PPO | Admitting: Physical Therapy

## 2014-10-27 DIAGNOSIS — R269 Unspecified abnormalities of gait and mobility: Secondary | ICD-10-CM | POA: Diagnosis not present

## 2014-10-27 DIAGNOSIS — M6281 Muscle weakness (generalized): Secondary | ICD-10-CM

## 2014-10-27 NOTE — Therapy (Signed)
[facility] [address] [place], [place], [address] Phone: [phone]   Fax:  [phone]  Physical Therapy Treatment  Patient Details  Name: Daniel Eaton MRN: [id number] Date of Birth: 1940/09/04 Referring Provider:  [name], MD  Encounter Date: 10/27/2014      PT End of Session - 10/27/14 1231    Visit Number 14   Number of Visits 20   Date for PT Re-Evaluation 11/05/14   Authorization - Visit Number 14   Authorization - Number of Visits 18   PT Start Time 5732   PT Stop Time 1202   PT Time Calculation (min) 44 min   Equipment Utilized During Treatment Gait belt   Activity Tolerance Patient tolerated treatment well;Patient limited by fatigue   Behavior During Therapy [facility] for tasks assessed/performed      No past medical history on file.  No past surgical history on file.  There were no vitals filed for this visit.  Visit Diagnosis:  Gait difficulty  Muscle weakness      Subjective Assessment - 10/27/14 1230    Subjective Pt reports no pain upon arrival to PT tx session. Pt states that he used the cane over the weekend and performed one rep of his HEP except sit to stand transfers. Pt reports walking in the grass over the weekend and no falls.    Limitations Walking;Standing;Lifting   Patient Stated Goals increase LE strength/balance/gait   Currently in Pain? No/denies        OBJECTIVE: Neuro re-ed: Gait on uneven surface (blue mat): forwards/backwards/heel to toe F/B/side stepping with one UE support (varied from the // bar to [facility]). Obstacle course in the hallway with step ups/overs/cone taps/blue airex pads with SPC and CGA x 4 trials. Pt carried a 3# weight in the L UE to simulate carrying something around his house. There ex: standing on blue mat arm exercises: scaption/triceps with heel raises/biceps with marching/shoulder press x 10 with 2#.   Pt response to Tx for medical necessity: Pt  deconditioned with obstacle course demonstrated by shortness of breath. Overall endurance is improving with PT sessions.         PT Long Term Goals - 10/24/14 1249    PT LONG TERM GOAL #1   Title Pt. I with HEP to increase B LE muscle strength to grossly 4+/5 MMT to improve balance/ safety with gait.     Baseline Hip flexion 3/5 bilaterally    Time 4   Period Weeks   Status Not Met   PT LONG TERM GOAL #2   Title Pt. will increase Berg balance test to >48/56 to improve balance/ safety with gait.     Baseline 41/56 on 09/03/14, 43/56 on 8/17; 45/56 on 9/9   Time 4   Period Weeks   Status Not Met   PT LONG TERM GOAL #3   Title Pt. able to tolerate 30 minutes of ther.ex. with no rest breaks safely.     Baseline Patient able to work out for 5 minutes on bike as warm up, 20 minutes on bike for main set at 50W, and 5 mins on TM at 1 mph   Time 4   Period Weeks   Status Achieved   PT LONG TERM GOAL #4   Title Pt. will ambulate with normalized gait pattern wtih use of SPC and proper 2-point gait pattern to improve independence.     Baseline Patient ambulating with minimal hip extension  and Trendelenburg bilaterally with SPC   Time 4   Period Weeks   Status Partially Met           Plan - 10/27/14 1232    Clinical Impression Statement Pt progresses with endurance training as demonstrated with completing standing balance tasks and an obstacle course with only a standing rest break between repetitions. Pt progressing well with fluid 2 point gait with SPC. Pt ambulates with decreased left step length and decreased hip flexion bilaterally. Pt able to maintain standing balance on uneven surface with dynamic UE arm movevements for 5 mins without any LOB.   Pt will benefit from skilled therapeutic intervention in order to improve on the following deficits Abnormal gait;Decreased activity tolerance;Decreased balance;Decreased strength;Increased edema;Pain;Improper body  mechanics;Hypomobility;Impaired flexibility;Decreased endurance;Decreased coordination;Difficulty walking;Obesity   Rehab Potential Good   PT Frequency 1x / week   PT Duration 3 weeks   PT Treatment/Interventions ADLs/Self Care Home Management;Cryotherapy;Moist Heat;Neuromuscular re-education;Gait training;Functional mobility training;Stair training;Therapeutic activities;Therapeutic exercise;Balance training;Manual techniques   PT Next Visit Plan Balance/change HEP   PT Home Exercise Plan Continue with forever fit and encouraged to perform HEP over the weekend.    Consulted and Agree with Plan of Care Patient        Problem List There are no active problems to display for this patient.  Lavone Neri, SPT  10/27/2014, 12:35 PM  [facility] [address] [place], [place], [address] Phone: [phone]   Fax:  [phone]

## 2014-11-03 ENCOUNTER — Encounter: Payer: Self-pay | Admitting: Physical Therapy

## 2014-11-03 ENCOUNTER — Ambulatory Visit: Payer: PPO | Admitting: Physical Therapy

## 2014-11-03 DIAGNOSIS — R269 Unspecified abnormalities of gait and mobility: Secondary | ICD-10-CM | POA: Diagnosis not present

## 2014-11-03 DIAGNOSIS — M6281 Muscle weakness (generalized): Secondary | ICD-10-CM

## 2014-11-03 NOTE — Therapy (Signed)
[facility] [facility] [address] [place], [place], [address] Phone: [phone]   Fax:  [phone]  Physical Therapy Treatment  Patient Details  Name: Daniel Eaton MRN: [id number] Date of Birth: 09/26/1940 Referring Provider:  Anderson, Marshall W, MD  Encounter Date: 11/03/2014      PT End of Session - 11/03/14 1232    Visit Number 15   Number of Visits 20   Date for PT Re-Evaluation 11/05/14   Authorization - Visit Number 15   Authorization - Number of Visits 18   PT Start Time 1110   PT Stop Time 1202   PT Time Calculation (min) 52 min   Equipment Utilized During Treatment Gait belt   Activity Tolerance Patient tolerated treatment well;Patient limited by fatigue;Patient limited by pain   Behavior During Therapy [facility] for tasks assessed/performed      History reviewed. No pertinent past medical history.  History reviewed. No pertinent past surgical history.  There were no vitals filed for this visit.  Visit Diagnosis:  Gait difficulty  Muscle weakness      Subjective Assessment - 11/03/14 1229    Subjective Pt reports no pain and was able to use the cane to enter [employer] once last week. Pt's current weight is 309.4#.  No falls or LOB reported since last PT appt.  Pt. will continue to email PT with status of walking/ exercise after [employer].     Limitations Walking;Standing;Lifting   Patient Stated Goals increase LE strength/balance/gait   Currently in Pain? No/denies         OBJECTIVE: Neuro re-ed: Balance on blue airex pad. Balance with changing center of mass with overhead reaching/ pertubations.  Balance on airex with 2 # weights: bicep curls/chest fly/punches with weight shift x 15 each. Gait: 6MWT attempted, completed 4:30 minutes to test endurance and focus on fluid 2 point gait pattern with SPC.  Walking outside with use of SPC and SBA for cuing/ step pattern/ increase heel strike on R LE.  There ex: side step  ups on 6" step with B UE support x 10 each leg. Side step up into partial lunge x 10 each side with B UE support (stepping up to the R required more UE support than the L).   Pt response to Tx for medical necessity: Pt deconditioned and poor activity endurance demonstrated with 6 MWT breathing. Pt overall endurance has progressed. Pt more independent with functional mobility and SPC.        PT Long Term Goals - 11/03/14 1237    PT LONG TERM GOAL #1   Title Pt. I with HEP to increase B LE muscle strength to grossly 4+/5 MMT to improve balance/ safety with gait.     Baseline Hip flexion 3/5 bilaterally    Time 4   Period Weeks   Status Partially Met   PT LONG TERM GOAL #2   Title Pt. will increase Berg balance test to >48/56 to improve balance/ safety with gait.     Baseline 41/56 on 09/03/14, 43/56 on 8/17; 45/56 on 9/9   Time 4   Period Weeks   Status Not Met   PT LONG TERM GOAL #3   Title Pt. able to tolerate 30 minutes of ther.ex. with no rest breaks safely.     Baseline Patient able to work out for 5 minutes on bike as warm up, 20 minutes on bike for main set at 50W, and 5 mins on TM at   1 mph   Time 4   Period Weeks   Status Achieved   PT LONG TERM GOAL #4   Title Pt. will ambulate with normalized gait pattern wtih use of SPC and proper 2-point gait pattern to improve independence.     Baseline Patient ambulating with minimal hip extension and Trendelenburg bilaterally with SPC   Time 4   Period Weeks   Status Achieved            Plan - 11/03/14 1233    Clinical Impression Statement Pt progressing with gait and SPC, pt able to complete 4:30 mins of 6 min walk test and cover 315' in 4 minutes. Pt had an episode of loss of balance at the end of the 4:30 due to pushing himself to far. Pt limited in closed chain strengthening due to R knee pain. Pt balance repsonds appropriately to perturbations and changes of center of mass with minimal cuing for posture correction.  Pt. able to  ambulate outside on rainy parking lot with good step pattern/ control.      Pt will benefit from skilled therapeutic intervention in order to improve on the following deficits Abnormal gait;Decreased activity tolerance;Decreased balance;Decreased strength;Increased edema;Pain;Improper body mechanics;Hypomobility;Impaired flexibility;Decreased endurance;Decreased coordination;Difficulty walking;Obesity   Rehab Potential Good   PT Frequency 1x / week   PT Duration 3 weeks   PT Treatment/Interventions ADLs/Self Care Home Management;Cryotherapy;Moist Heat;Neuromuscular re-education;Gait training;Functional mobility training;Stair training;Therapeutic activities;Therapeutic exercise;Balance training;Manual techniques   PT Next Visit Plan reassess goals/HEP probable discharge   PT Home Exercise Plan Continue with forever fit and encouraged to perform HEP over the weekend.    Consulted and Agree with Plan of Care Patient        Problem List There are no active problems to display for this patient.  Michael C Sherk, PT, DPT # [id number]   11/03/2014, 1:25 PM  [place] [address] [place], [place], [address] Phone: [phone]   Fax:  [phone]      

## 2014-11-10 ENCOUNTER — Encounter: Payer: Self-pay | Admitting: Physical Therapy

## 2014-11-10 ENCOUNTER — Ambulatory Visit: Payer: PPO | Admitting: Physical Therapy

## 2014-11-10 DIAGNOSIS — R269 Unspecified abnormalities of gait and mobility: Secondary | ICD-10-CM | POA: Diagnosis not present

## 2014-11-10 DIAGNOSIS — M6281 Muscle weakness (generalized): Secondary | ICD-10-CM

## 2014-11-10 NOTE — Therapy (Addendum)
[facility] [address] [place], [place], [address] Phone: [phone]   Fax:  [phone]  Physical Therapy Treatment  Patient Details  Name: Daniel Eaton MRN: [id number] Date of Birth: September 03, 1940 Referring Provider:  [name], MD  Encounter Date: 11/10/2014      PT End of Session - 11/10/14 1246    Visit Number 16   Number of Visits 20   Date for PT Re-Evaluation 12/08/14   Authorization - Visit Number 16   Authorization - Number of Visits 18   PT Start Time 1110   PT Stop Time 1202   PT Time Calculation (min) 52 min   Equipment Utilized During Treatment Gait belt   Activity Tolerance Patient tolerated treatment well;Patient limited by fatigue   Behavior During Therapy [facility] for tasks assessed/performed      History reviewed. No pertinent past medical history.  History reviewed. No pertinent past surgical history.  There were no vitals filed for this visit.  Visit Diagnosis:  Gait difficulty  Muscle weakness      Subjective Assessment - 11/10/14 1240    Subjective Pt reports attending [employer] twice last week and having to use the rolling walker to get in. Pt states that he has noticed getting tired frequently and that 20 minutes on the Nustep leaves him fatigued. Pt's weight today: 311#.    Limitations Walking;Standing;Lifting   Patient Stated Goals increase LE strength/balance/gait   Currently in Pain? No/denies        OBJECTIVE:Ther ex: Step over and up on Airex pads and 3" steps. Various tasks such as toe raises/heel raises/marching/eyes closed balance Pt encouraged to use one UE to light touch but unable. Step overs on 6" steps forwards and sideways x 20 each. Gait training:Treadmill 10  Minutes for endurance with cues for proper heel strike and stride length. Distance covered 0.16 miles at 1.0 mph with B UE support.   Pt response to Tx for medical necessity: Increased endurance noted with 10  min walk. Pt still guarded by fear of falling with various balance tasks.        PT Education - 11/10/14 1245    Education provided Yes   Education Details Pt educated on importance of staying active to increase endurance. Pt educated on the importance of him having more independence and compliance with HEP.    Person(s) Educated Patient   Methods Explanation   Comprehension Verbalized understanding             PT Long Term Goals - 11/10/14 1410    PT LONG TERM GOAL #1   Title Pt. I with HEP to increase B LE muscle strength to grossly 4+/5 MMT to improve balance/ safety with gait.     Baseline Hip flexion 3/5 bilaterally    Time 4   Period Weeks   Status Partially Met   PT LONG TERM GOAL #2   Title Pt. will increase Berg balance test to >48/56 to improve balance/ safety with gait.     Baseline 41/56 on 09/03/14, 43/56 on 8/17; 45/56 on 9/9   Time 4   Period Weeks   Status Not Met   PT LONG TERM GOAL #3   Title Pt. able to tolerate 30 minutes of ther.ex. with no rest breaks safely.     Baseline Patient able to work out for 5 minutes on bike as warm up, 20 minutes on bike for main set at 50W, and 5 mins  on TM at 1 mph   Time 4   Period Weeks   Status Achieved   PT LONG TERM GOAL #4   Title Pt. will ambulate with normalized gait pattern wtih use of SPC and proper 2-point gait pattern to improve independence.     Baseline Patient ambulating with minimal hip extension and Trendelenburg bilaterally with SPC   Time 4   Period Weeks   Status Achieved            Plan - 11/10/14 1247    Clinical Impression Statement Pt limited in standing single leg balance activities secondary to R knee pain. Pt is able to stand on R knee but unable to step up on R side. Pt ambulates with SPC and normalized gait pattern. Pt overall endurance has increased as demonstrated by 10 minute walk on treadmill at 1.0 mph for a distance of 0.16 miles.    Pt will benefit from skilled therapeutic  intervention in order to improve on the following deficits Abnormal gait;Decreased activity tolerance;Decreased balance;Decreased strength;Increased edema;Pain;Improper body mechanics;Hypomobility;Impaired flexibility;Decreased endurance;Decreased coordination;Difficulty walking;Obesity   Rehab Potential Good   PT Frequency Other (comment)  one time in 3 weeks   PT Duration 3 weeks   PT Treatment/Interventions ADLs/Self Care Home Management;Cryotherapy;Moist Heat;Neuromuscular re-education;Gait training;Functional mobility training;Stair training;Therapeutic activities;Therapeutic exercise;Balance training;Manual techniques   PT Next Visit Plan reassess HEP/pt's compliance and progression toward independence/ more strengthening for home    PT Home Exercise Plan Forever fit and HEP   Consulted and Agree with Plan of Care Patient          G-Codes - 12-08-2014 1050    Functional Assessment Tool Used Berg balance test/ gait pattern/ clinical impression   Functional Limitation Mobility: Walking and moving around   Mobility: Walking and Moving Around Current Status [phone]) At least 1 percent but less than 20 percent impaired, limited or restricted   Mobility: Walking and Moving Around Goal Status [phone]) 0 percent impaired, limited or restricted      Problem List There are no active problems to display for this patient.  Pura Spice, PT, DPT # [phone]   08-Dec-2014, 10:54 AM  [facility] [address] [place], [place], [address] Phone: [phone]   Fax:  [phone]

## 2014-12-01 ENCOUNTER — Encounter: Payer: Self-pay | Admitting: Physical Therapy

## 2014-12-01 ENCOUNTER — Ambulatory Visit: Payer: PPO | Attending: Internal Medicine | Admitting: Physical Therapy

## 2014-12-01 DIAGNOSIS — R269 Unspecified abnormalities of gait and mobility: Secondary | ICD-10-CM | POA: Diagnosis not present

## 2014-12-01 DIAGNOSIS — M6281 Muscle weakness (generalized): Secondary | ICD-10-CM | POA: Insufficient documentation

## 2014-12-01 NOTE — Therapy (Signed)
[facility] [address] [place], [place], [address] Phone: [phone]   Fax:  [phone]  Physical Therapy Treatment  Patient Details  Name: Daniel Eaton MRN: [id number] Date of Birth: [birth year] No Data Recorded  Encounter Date: 12/01/2014      PT End of Session - 12/01/14 1357    Visit Number 17   Number of Visits 20   Date for PT Re-Evaluation 12/08/14   Authorization - Visit Number 17   Authorization - Number of Visits 18   PT Start Time 1120   PT Stop Time 1205   PT Time Calculation (min) 45 min   Equipment Utilized During Treatment Gait belt   Activity Tolerance Patient tolerated treatment well;Patient limited by fatigue   Behavior During Therapy [facility] for tasks assessed/performed      History reviewed. No pertinent past medical history.  History reviewed. No pertinent past surgical history.  There were no vitals filed for this visit.  Visit Diagnosis:  Gait difficulty  Muscle weakness      Subjective Assessment - 12/01/14 1353    Subjective Pt reports no new complaints since last tx session. Pt states that he has been attending [employer] and perfoming seated HEP. Pt limited with standing HEP due to abdominal mass.    Limitations Walking;Standing;Lifting   Patient Stated Goals increase LE strength/balance/gait   Currently in Pain? No/denies       OBJECTIVE: BERG: 49/56 MMT: B LE grossly 4/5 Neuro re-ed: BERG, single leg stance with UE support able to maintain for 30 seconds, tandem stance able to maintain for 30 seconds with R LE behind, balance on airex with extension and eyes closed (held eyes closed for 10 seconds); Airex with eyes open and neck extension 30 seconds x 2. Postural corrective on Airex 30 seconds x 2. There ex: Resisted with green TB hip flexion and extension x 10 each side; Standing clamshells with Green TB (issued as HEP). Alternating foot taps on the stool x 20 each side to facilitate  single leg stance. Side step overs x 15 (shortness of breath noted). Forwards step over x 20.   Pt response to Tx for medical necessity: Pt progressed balance and strength to improve overall. MMT improved by 1 MMT grade. Pt now ambulates with confidence for community distances with SPC instead of RW. At this time, pt has no need for skilled PT and discharged to independent HEP.          PT Education - 12/01/14 1354    Education provided Yes   Education Details Pt given resistive exercise to perform at home on days he is not at [employer]. Pt given 3 weight machines to add to Forever fit routine.    Person(s) Educated Patient   Methods Explanation;Demonstration;Handout   Comprehension Verbalized understanding;Returned demonstration             PT Long Term Goals - 12/01/14 1401    PT LONG TERM GOAL #1   Title Pt. I with HEP to increase B LE muscle strength to grossly 4+/5 MMT to improve balance/ safety with gait.     Baseline Hip flexion 4/5 bilaterally    Time 4   Period Weeks   Status Partially Met   PT LONG TERM GOAL #2   Title Pt. will increase Berg balance test to >48/56 to improve balance/ safety with gait.     Baseline 41/56 on 09/03/14, 43/56 on 8/17; 45/56 on 9/9; 49/56 on  2022/12/16   Time 4   Period Weeks   Status Achieved   PT LONG TERM GOAL #3   Title Pt. able to tolerate 30 minutes of ther.ex. with no rest breaks safely.     Baseline Patient able to work out for 5 minutes on bike as warm up, 20 minutes on bike for main set at 50W, and 5 mins on TM at 1 mph   Time 4   Period Weeks   Status Achieved   PT LONG TERM GOAL #4   Title Pt. will ambulate with normalized gait pattern wtih use of SPC and proper 2-point gait pattern to improve independence.     Baseline Patient ambulating with minimal hip extension and Trendelenburg bilaterally with SPC   Time 4   Period Weeks   Status Achieved           Plan - 16-Dec-2014 1358    Clinical Impression Statement Pt  demonstrates good compliance with HEP and Forever Fit programs in his three weeks since last seen at PT. Pt's BERG score 49/56 and shows improvement in tandem stance and eyes closed. Pt is able to ambulate with SPC as long as he is not tired everyhwhere he would like. Pt is able to perform toe taps without the use of UE support forwards. Pt has made good progress with PT to increase balance and ambulation. Pt is still limited by fatigue and shortness of breath. At this time, pt is discharged from skilled PT.   Pt will benefit from skilled therapeutic intervention in order to improve on the following deficits Abnormal gait;Decreased activity tolerance;Decreased balance;Decreased strength;Increased edema;Pain;Improper body mechanics;Hypomobility;Impaired flexibility;Decreased endurance;Decreased coordination;Difficulty walking;Obesity   Rehab Potential Good   PT Treatment/Interventions ADLs/Self Care Home Management;Cryotherapy;Moist Heat;Neuromuscular re-education;Gait training;Functional mobility training;Stair training;Therapeutic activities;Therapeutic exercise;Balance training;Manual techniques   PT Home Exercise Plan Forever fit and handout    Consulted and Agree with Plan of Care Patient          G-Codes - 12-16-14 0759    Functional Assessment Tool Used Merrilee Jansky balance test/ gait pattern/ clinical impression   Functional Limitation Mobility: Walking and moving around   Mobility: Walking and Moving Around Current Status [phone]) At least 1 percent but less than 20 percent impaired, limited or restricted   Mobility: Walking and Moving Around Goal Status [phone]) At least 1 percent but less than 20 percent impaired, limited or restricted   Mobility: Walking and Moving Around Discharge Status [phone]) At least 1 percent but less than 20 percent impaired, limited or restricted      Problem List There are no active problems to display for this patient.  Pura Spice, PT, DPT # [id number]    12/02/2014, 8:00 AM  [facility] [address] [place], [place], [address] Phone: [phone]   Fax:  [phone]  Name: Daniel Eaton MRN: [id number] Date of Birth: [birth year]

## 2014-12-16 ENCOUNTER — Encounter: Payer: Self-pay | Admitting: *Deleted

## 2014-12-31 ENCOUNTER — Encounter: Payer: Self-pay | Admitting: General Surgery

## 2014-12-31 ENCOUNTER — Ambulatory Visit (INDEPENDENT_AMBULATORY_CARE_PROVIDER_SITE_OTHER): Payer: PPO | Admitting: General Surgery

## 2014-12-31 VITALS — BP 130/80 | HR 84 | Resp 14 | Ht 71.0 in | Wt 304.0 lb

## 2014-12-31 DIAGNOSIS — K439 Ventral hernia without obstruction or gangrene: Secondary | ICD-10-CM

## 2014-12-31 NOTE — Patient Instructions (Addendum)
abdominal and pelvis CT scan oral contrast only  Patient has been scheduled for a CT abdomen/pelvis without contrast (oral contrast only) at [facility] for 01-05-15 at 11 am (arrive 10:45 am). Prep: NPO 4 hours prior and pick up prep kit. This patient verbalizes understanding.  Cardiac clearance needed by Dr Ouida Sills

## 2014-12-31 NOTE — Progress Notes (Signed)
Patient ID: Daniel Eaton., male   DOB: 1940-09-23, 74 y.o.   MRN: [id number]  Chief Complaint  Patient presents with  . Umbilical Hernia    HPI Daniel Eaton. is a 74 y.o. male here today for a evaluation of a umbilical hernia repair. He states he noticed this about four months ago. Patient states there is some burning pain, tender to touch. He states he has lost about 30 pounds in the last couple of months. He goes to PT at [facility] for balance issues.  HPI  Past Medical History  Diagnosis Date  . COPD (chronic obstructive pulmonary disease) ([place])   . Sleep apnea   . Kidney disease   . A-fib ([place])   . Hyperlipidemia   . Hypertension   . Stroke ([place])   . Thyroid disease     Past Surgical History  Procedure Laterality Date  . Colonoscopy  2008  . Tonsillectomy    . Vp shunt placement  1979  . Hernia repair      bilateral inguinal hernia/ [facility]    No family history on file.  Social History Social History  Substance Use Topics  . Smoking status: Former Research scientist (life sciences)  . Smokeless tobacco: None  . Alcohol Use: No    Allergies  Allergen Reactions  . Diltiazem Hcl Swelling    edema    Current Outpatient Prescriptions  Medication Sig Dispense Refill  . amiodarone (PACERONE) 200 MG tablet Take by mouth.    . dabigatran (PRADAXA) 150 MG CAPS capsule Take by mouth.    . diphenhydrAMINE (BENADRYL) 25 mg capsule Take by mouth.    . levothyroxine (SYNTHROID, LEVOTHROID) 75 MCG tablet Take by mouth.    . metolazone (ZAROXOLYN) 2.5 MG tablet Take by mouth.    . metoprolol (LOPRESSOR) 100 MG tablet Take by mouth.    . Multiple Vitamin (MULTI-VITAMINS) TABS Take by mouth.    Marland Kitchen omeprazole (PRILOSEC) 40 MG capsule Take by mouth.    . spironolactone (ALDACTONE) 25 MG tablet Take by mouth.    . torsemide (DEMADEX) 20 MG tablet Take by mouth.     No current facility-administered medications for this visit.    Review of Systems Review of Systems  Constitutional: Negative.    Respiratory: Negative.   Cardiovascular: Negative.     Blood pressure 130/80, pulse 84, resp. rate 14, height 5' 11"  (1.803 m), weight 304 lb (137.893 kg).  Physical Exam Physical Exam  Constitutional: He is oriented to person, place, and time. He appears well-developed and well-nourished.  HENT:  Mouth/Throat: Oropharynx is clear and moist.  Eyes: Conjunctivae are normal. No scleral icterus.  Neck: Neck supple.  Cardiovascular: Normal rate, regular rhythm and normal heart sounds.   Pulmonary/Chest: Effort normal and breath sounds normal.  Abdominal: Soft. Normal appearance and bowel sounds are normal. There is no hepatomegaly. There is no tenderness. A hernia is present.    Umbilical defect present.  Lymphadenopathy:    He has no cervical adenopathy.  Neurological: He is alert and oriented to person, place, and time.  Skin: Skin is warm and dry.  Psychiatric: His behavior is normal.    Data Reviewed PCP notes dated 12/15/2004 were reviewed. Multiple medical comorbidities stable.  Laboratory studies at that visit showed normal electrolytes, creatinine 1.5 with an estimated GFR 46, IMA lobe and 13.1 with an MCV of 95, white blood cell count 7800. Normal differential.  Assessment    Ventral hernia, reducible, enlarging.    Plan  CT scan of the area to assess for secondary hernias and help determine best approach towards repair has been recommended. No IV contrast based on his renal status.    Cardiac clearance needed by Dr Ouida Sills Recommend abdominal and pelvis CT scan oral contrast only  Hernia precautions and incarceration were discussed with the patient. If they develop symptoms of an incarcerated hernia, they were encouraged to seek prompt medical attention.  I have recommended repair of the hernia using mesh on an outpatient basis in the near future. The risk of infection was reviewed. The role of prosthetic mesh to minimize the risk of recurrence was  reviewed.  Patient has been scheduled for a CT abdomen/pelvis without contrast (oral contrast only) at [facility] for 01-05-15 at 11 am (arrive 10:45 am). Prep: NPO 4 hours prior and pick up prep kit. This patient verbalizes understanding.    PCP/Ref:  Irish Elders W 01/02/2015, 1:33 PM

## 2015-01-02 DIAGNOSIS — K439 Ventral hernia without obstruction or gangrene: Secondary | ICD-10-CM | POA: Insufficient documentation

## 2015-01-05 ENCOUNTER — Ambulatory Visit
Admission: RE | Admit: 2015-01-05 | Discharge: 2015-01-05 | Disposition: A | Discharge status: Home / Self Care | Payer: PPO | Source: Ambulatory Visit | Attending: General Surgery | Admitting: General Surgery

## 2015-01-05 DIAGNOSIS — K439 Ventral hernia without obstruction or gangrene: Secondary | ICD-10-CM | POA: Diagnosis present

## 2015-01-06 ENCOUNTER — Telehealth: Payer: Self-pay

## 2015-01-06 NOTE — Telephone Encounter (Signed)
-----   Message from Robert Bellow, MD sent at 01/05/2015  4:44 PM EST ----- Please notify the patient that I reviewed his CT scan. He has 2 hernias. Once we've received medical clearance from Dr. Ouida Sills, we can arrange for elective repair. He will need a preoperative appointment. ----- Message -----    From: Rad Results In Interface    Sent: 01/05/2015   1:22 PM      To: Robert Bellow, MD

## 2015-01-06 NOTE — Telephone Encounter (Signed)
Notified patient as instructed, patient pleased. Patient will schedule a follow up with Dr Ouida Sills for surgical clearance. He will call to be seen here for a pre operative appointment.

## 2015-01-12 ENCOUNTER — Ambulatory Visit (INDEPENDENT_AMBULATORY_CARE_PROVIDER_SITE_OTHER): Payer: PPO | Admitting: General Surgery

## 2015-01-12 ENCOUNTER — Encounter: Payer: Self-pay | Admitting: *Deleted

## 2015-01-12 ENCOUNTER — Encounter: Payer: Self-pay | Admitting: General Surgery

## 2015-01-12 VITALS — BP 130/74 | HR 74 | Resp 16 | Ht 71.0 in | Wt 305.0 lb

## 2015-01-12 DIAGNOSIS — K439 Ventral hernia without obstruction or gangrene: Secondary | ICD-10-CM | POA: Diagnosis not present

## 2015-01-12 DIAGNOSIS — K429 Umbilical hernia without obstruction or gangrene: Secondary | ICD-10-CM

## 2015-01-12 NOTE — Progress Notes (Signed)
Patient ID: Daniel Eaton., male   DOB: [birth year], 74 y.o.   MRN: [id number]  Chief Complaint  Patient presents with  . Other    ventral hernia repair    HPI Daniel Eaton. is a 74 y.o. male here today to review his recently completed CT scan to better assess the ventral hernia. He tolerated the procedure well. He is accompanied his wife. HPI  Past Medical History  Diagnosis Date  . COPD (chronic obstructive pulmonary disease) ([place])   . Sleep apnea   . Kidney disease   . A-fib ([place])   . Hyperlipidemia   . Hypertension   . Stroke ([place])   . Thyroid disease     Past Surgical History  Procedure Laterality Date  . Colonoscopy  2008  . Tonsillectomy    . Vp shunt placement  1979  . Hernia repair      bilateral inguinal hernia/ [facility]    No family history on file.  Social History Social History  Substance Use Topics  . Smoking status: Former Research scientist (life sciences)  . Smokeless tobacco: None  . Alcohol Use: No    Allergies  Allergen Reactions  . Diltiazem Hcl Swelling    edema    Current Outpatient Prescriptions  Medication Sig Dispense Refill  . amiodarone (PACERONE) 200 MG tablet Take by mouth.    . dabigatran (PRADAXA) 150 MG CAPS capsule Take by mouth.    . diphenhydrAMINE (BENADRYL) 25 mg capsule Take by mouth.    . levothyroxine (SYNTHROID, LEVOTHROID) 75 MCG tablet Take by mouth.    . metolazone (ZAROXOLYN) 2.5 MG tablet Take by mouth.    . metoprolol (LOPRESSOR) 100 MG tablet Take by mouth.    . Multiple Vitamin (MULTI-VITAMINS) TABS Take by mouth.    Marland Kitchen omeprazole (PRILOSEC) 40 MG capsule Take by mouth.    . spironolactone (ALDACTONE) 25 MG tablet Take by mouth.    . torsemide (DEMADEX) 20 MG tablet Take by mouth.     No current facility-administered medications for this visit.    Review of Systems Review of Systems  Constitutional: Negative.   Respiratory: Negative.   Cardiovascular: Negative.     Blood pressure 130/74, pulse 74, resp. rate 16, height 5'  11" (1.803 m), weight 305 lb (138.347 kg).  Physical Exam Physical Exam  Constitutional: He is oriented to person, place, and time. He appears well-developed and well-nourished.  Eyes: Conjunctivae are normal. No scleral icterus.  Neck: Neck supple.  Cardiovascular: Normal rate, regular rhythm and normal heart sounds.   Pulmonary/Chest: Effort normal and breath sounds normal.  Abdominal: Soft. Normal appearance and bowel sounds are normal. There is no tenderness. A hernia ( umbilical hernia) is present. Hernia confirmed positive in the ventral area.    Lymphadenopathy:    He has no cervical adenopathy.  Neurological: He is alert and oriented to person, place, and time.  Skin: Skin is warm and dry.    Data Reviewed Laboratory studies from 12/09/2014 evaluation with Governor Specking, M.D.   Component Date Value Ref Range Status  . Glucose 12/09/2014 80 70 - 110 mg/dL Final  . Sodium 12/09/2014 141 136 - 145 mmol/L Final  . Potassium 12/09/2014 4.1 3.6 - 5.1 mmol/L Final  . Chloride 12/09/2014 101 97 - 109 mmol/L Final  . Carbon Dioxide (CO2) 12/09/2014 30.5 22.0 - 32.0 mmol/L Final  . Calcium 12/09/2014 9.4 8.7 - 10.3 mg/dL Final  . Urea Nitrogen (BUN) 12/09/2014 27* 7 - 25 mg/dL Final  .  Creatinine 12/09/2014 1.5* 0.7 - 1.3 mg/dL Final  . Glomerular Filtration Rate (eGFR),* 12/09/2014 46* >60 mL/min/1.73sq m Final  . BUN/Crea Ratio 12/09/2014 18.0 6.0 - 20.0 Final  . Anion Gap w/K 12/09/2014 13.6 6.0 - 16.0 Final  . WBC (White Blood Cell Count) 12/09/2014 7.8 4.1 - 10.2 10^3/uL Final  . RBC (Red Blood Cell Count) 12/09/2014 4.44* 4.69 - 6.13 10^6/uL Final  . Hemoglobin 12/09/2014 13.1* 14.1 - 18.1 gm/dL Final  . Hematocrit 12/09/2014 42.3 40.0 - 52.0 % Final  . MCV (Mean Corpuscular Volume) 12/09/2014 95.3 80.0 - 100.0 fl Final  . MCH (Mean Corpuscular Hemoglobin) 12/09/2014 29.5 27.0 - 31.2 pg Final  . MCHC (Mean Corpuscular Hemoglobin * 12/09/2014 31.0* 32.0 - 36.0 gm/dL Final   . Platelet Count 12/09/2014 245 150 - 450 10^3/uL Final  . RDW-CV (Red Cell Distribution Widt* 12/09/2014 18.2* 11.6 - 14.8 % Final  . MPV (Mean Platelet Volume) 12/09/2014 9.7 8.0 - 10.0 fl Final  . Neutrophils 12/09/2014 5.43 1.50 - 7.80 10^3/uL Final  . Lymphocytes 12/09/2014 1.46 1.00 - 3.60 10^3/uL Final  . Monocytes 12/09/2014 0.65 0.00 - 1.50 10^3/uL Final  . Eosinophils 12/09/2014 0.15 0.00 - 0.55 10^3/uL Final  . Basophils 12/09/2014 0.04 0.00 - 0.09 10^3/uL Final  . Neutrophil % 12/09/2014 70.0 32.0 - 70.0 % Final  . Lymphocyte % 12/09/2014 18.8 10.0 - 50.0 % Final  . Monocyte % 12/09/2014 8.4 4.0 - 13.0 % Final  . Eosinophil % 12/09/2014 1.9 1.0 - 5.0 % Final  . Basophil% 12/09/2014 0.5 0.0 - 2.0 % Final  . Immature Granulocyte % 12/09/2014 0.4 <=0.7 % Final  . Immature Granulocyte Count 12/09/2014 0.03 <=0.06 10^3/L Final  . Cholesterol, Total 12/09/2014 153 100 - 200 mg/dL Final  . Triglyceride 12/09/2014 162 35 - 199 mg/dL Final  . HDL (High Density Lipoprotein) Cho* 12/09/2014 49.3 29.0 - 71.0 mg/dL Final  . LDL (Low Density Lipoprotien), Cal* 12/09/2014 71 0 - 130 mg/dL Final  . VLDL Cholesterol 12/09/2014 32 mg/dL Final  . Cholesterol/HDL Ratio 12/09/2014 3.1 Final  . Thyroid Stimulating Hormone (TSH) 12/09/2014 1.393 0.340 - 5.600 uIU/mL Final    CT scan of the abdomen and pelvis completed 01/05/2015 was independently reviewed.   FINDINGS: The lung bases are unremarkable. Sagittal images of the spine shows degenerative changes thoracolumbar spine.  There are streaky artifacts from patient's large body habitus. There is oral contrast material within distal esophagus. Thickening of distal esophageal wall suspicious for reflux esophagitis. Clinical correlation is necessary.  Unenhanced liver shows no biliary ductal dilatation. No calcified gallstones are noted within gallbladder. Unenhanced pancreas, spleen and adrenal glands are unremarkable. Unenhanced  kidneys are symmetrical in size. No nephrolithiasis. No hydronephrosis or hydroureter. Atherosclerotic calcifications are noted bilateral renal artery origin, SMA and celiac trunk origin, distal abdominal aorta and bilateral common iliac arteries. No aortic aneurysm. There is a umbilical hernia containing omental fat and tiny segment of small bowel. There is no evidence of acute complication. The hernia measures 6.7 cm.  There is a midline supraumbilical ventral hernia containing omental fat and small bowel loops measures 12 by 5 cm. There is no evidence of acute complication. This is best seen in sagittal image 73.  No small bowel obstruction. No ascites or free air. No adenopathy. There is no pericecal inflammation. Normal appendix is noted in axial image 65. Scattered diverticula are noted descending colon and sigmoid colon. There is no evidence of acute diverticulitis. Redundant sigmoid colon. Prostate gland  and seminal vesicles are unremarkable. There is a left inguinal scrotal canal hernia containing fat measures 2.5 cm without evidence of acute complication. Urinary bladder is under distended. Degenerative changes pubic symphysis. Degenerative changes bilateral SI joints.  Oral contrast material was given to the patient. No contrast extravasation  Unenhanced kidneys are symmetrical in size. No nephrolithiasis. No hydronephrosis or hydroureter. No calcified ureteral calculi.  IMPRESSION: 1. There is a umbilical hernia containing omental fat and short segment of small bowel without evidence or complication. A midline supraumbilical ventral hernia containing fat and small bowel loops measures 12 x 5 cm. No evidence of acute complication. No small bowel obstruction. 2. Normal appendix. No pericecal inflammation. 3. No small bowel obstruction. No ascites or free air. 4. No hydronephrosis or hydroureter. No nephrolithiasis. 5. Degenerative changes thoracolumbar spine  and bilateral SI joints.  The fascial defect for the ventral hernia is approximately 5 cm, while there is a large hernia sac evident. The fascial defect at the umbilicus is smaller estimated at 3-4 centers.     Assessment    Ventral and umbilical hernia.  Multiple medical comorbidities.    Plan    PCP notes from Governor Specking dated earlier this month were reviewed. No absolute contraindication for surgical intervention.    Hernia precautions and incarceration were discussed with the patient. If they develop symptoms of an incarcerated hernia, they were encouraged to seek prompt medical attention.  I have recommended repair of the hernia using mesh on an outpatient basis in the near future. The risk of infection was reviewed. The role of prosthetic mesh to minimize the risk of recurrence was reviewed.  Patient to bring his C-pap machine day of surgery.   Stop Pradaxa three days before surgery and start a baby aspirin five days before surgery.   We'll plan for a laparoscopic repair, but if necessary open repair will be undertaken. Laparoscopy is felt to be of benefit due to the patient's underlying pulmonary dysfunction, but with the large amount of contents it may be difficult to reduce and provide adequate closure.  Patient's surgery has been scheduled for 02-02-15 at [facility].  Restrictions after surgery were reviewed: No lifting over 10 pounds. No driving until pain-free.  Risks associated with cessation of anticoagulation therapy in regards to stroke were discussed.  PCP:  Sol Passer 01/12/2015, 5:18 PM

## 2015-01-12 NOTE — H&P (Signed)
HPI  Daniel Eaton. is a 74 y.o. male here today to review his recently completed CT scan to better assess the ventral hernia. He tolerated the procedure well. He is accompanied his wife.  HPI  Past Medical History   Diagnosis  Date   .  COPD (chronic obstructive pulmonary disease) ([place])    .  Sleep apnea    .  Kidney disease    .  A-fib ([place])    .  Hyperlipidemia    .  Hypertension    .  Stroke ([place])    .  Thyroid disease     Past Surgical History   Procedure  Laterality  Date   .  Colonoscopy   2008   .  Tonsillectomy     .  Vp shunt placement   1979   .  Hernia repair       bilateral inguinal hernia/ [facility]    No family history on file.  Social History  Social History   Substance Use Topics   .  Smoking status:  Former Research scientist (life sciences)   .  Smokeless tobacco:  None   .  Alcohol Use:  No    Allergies   Allergen  Reactions   .  Diltiazem Hcl  Swelling     edema    Current Outpatient Prescriptions   Medication  Sig  Dispense  Refill   .  amiodarone (PACERONE) 200 MG tablet  Take by mouth.     .  dabigatran (PRADAXA) 150 MG CAPS capsule  Take by mouth.     .  diphenhydrAMINE (BENADRYL) 25 mg capsule  Take by mouth.     .  levothyroxine (SYNTHROID, LEVOTHROID) 75 MCG tablet  Take by mouth.     .  metolazone (ZAROXOLYN) 2.5 MG tablet  Take by mouth.     .  metoprolol (LOPRESSOR) 100 MG tablet  Take by mouth.     .  Multiple Vitamin (MULTI-VITAMINS) TABS  Take by mouth.     Marland Kitchen  omeprazole (PRILOSEC) 40 MG capsule  Take by mouth.     .  spironolactone (ALDACTONE) 25 MG tablet  Take by mouth.     .  torsemide (DEMADEX) 20 MG tablet  Take by mouth.      No current facility-administered medications for this visit.    Review of Systems  Review of Systems  Constitutional: Negative.  Respiratory: Negative.  Cardiovascular: Negative.   Blood pressure 130/74, pulse 74, resp. rate 16, height 5' 11"  (1.803 m), weight 305 lb (138.347 kg).  Physical Exam  Physical Exam    Constitutional: He is oriented to person, place, and time. He appears well-developed and well-nourished.  Eyes: Conjunctivae are normal. No scleral icterus.  Neck: Neck supple.  Cardiovascular: Normal rate, regular rhythm and normal heart sounds.  Pulmonary/Chest: Effort normal and breath sounds normal.  Abdominal: Soft. Normal appearance and bowel sounds are normal. There is no tenderness. A hernia ( umbilical hernia) is present. Hernia confirmed positive in the ventral area.    Lymphadenopathy:  He has no cervical adenopathy.  Neurological: He is alert and oriented to person, place, and time.  Skin: Skin is warm and dry.   Data Reviewed  Laboratory studies from 12/09/2014 evaluation with Governor Specking, M.D.   Component Date Value Ref Range Status  . Glucose 12/09/2014 80 70 - 110 mg/dL Final  . Sodium 12/09/2014 141 136 - 145 mmol/L Final  . Potassium 12/09/2014 4.1 3.6 - 5.1  mmol/L Final  . Chloride 12/09/2014 101 97 - 109 mmol/L Final  . Carbon Dioxide (CO2) 12/09/2014 30.5 22.0 - 32.0 mmol/L Final  . Calcium 12/09/2014 9.4 8.7 - 10.3 mg/dL Final  . Urea Nitrogen (BUN) 12/09/2014 27* 7 - 25 mg/dL Final  . Creatinine 12/09/2014 1.5* 0.7 - 1.3 mg/dL Final  . Glomerular Filtration Rate (eGFR),* 12/09/2014 46* >60 mL/min/1.73sq m Final  . BUN/Crea Ratio 12/09/2014 18.0 6.0 - 20.0 Final  . Anion Gap w/K 12/09/2014 13.6 6.0 - 16.0 Final  . WBC (White Blood Cell Count) 12/09/2014 7.8 4.1 - 10.2 10^3/uL Final  . RBC (Red Blood Cell Count) 12/09/2014 4.44* 4.69 - 6.13 10^6/uL Final  . Hemoglobin 12/09/2014 13.1* 14.1 - 18.1 gm/dL Final  . Hematocrit 12/09/2014 42.3 40.0 - 52.0 % Final  . MCV (Mean Corpuscular Volume) 12/09/2014 95.3 80.0 - 100.0 fl Final  . MCH (Mean Corpuscular Hemoglobin) 12/09/2014 29.5 27.0 - 31.2 pg Final  . MCHC (Mean Corpuscular Hemoglobin * 12/09/2014 31.0* 32.0 - 36.0 gm/dL Final  . Platelet Count 12/09/2014 245 150 - 450 10^3/uL Final  . RDW-CV (Red Cell  Distribution Widt* 12/09/2014 18.2* 11.6 - 14.8 % Final  . MPV (Mean Platelet Volume) 12/09/2014 9.7 8.0 - 10.0 fl Final  . Neutrophils 12/09/2014 5.43 1.50 - 7.80 10^3/uL Final  . Lymphocytes 12/09/2014 1.46 1.00 - 3.60 10^3/uL Final  . Monocytes 12/09/2014 0.65 0.00 - 1.50 10^3/uL Final  . Eosinophils 12/09/2014 0.15 0.00 - 0.55 10^3/uL Final  . Basophils 12/09/2014 0.04 0.00 - 0.09 10^3/uL Final  . Neutrophil % 12/09/2014 70.0 32.0 - 70.0 % Final  . Lymphocyte % 12/09/2014 18.8 10.0 - 50.0 % Final  . Monocyte % 12/09/2014 8.4 4.0 - 13.0 % Final  . Eosinophil % 12/09/2014 1.9 1.0 - 5.0 % Final  . Basophil% 12/09/2014 0.5 0.0 - 2.0 % Final  . Immature Granulocyte % 12/09/2014 0.4 <=0.7 % Final  . Immature Granulocyte Count 12/09/2014 0.03 <=0.06 10^3/L Final  . Cholesterol, Total 12/09/2014 153 100 - 200 mg/dL Final  . Triglyceride 12/09/2014 162 35 - 199 mg/dL Final  . HDL (High Density Lipoprotein) Cho* 12/09/2014 49.3 29.0 - 71.0 mg/dL Final  . LDL (Low Density Lipoprotien), Cal* 12/09/2014 71 0 - 130 mg/dL Final  . VLDL Cholesterol 12/09/2014 32 mg/dL Final  . Cholesterol/HDL Ratio 12/09/2014 3.1 Final  . Thyroid Stimulating Hormone (TSH) 12/09/2014 1.393 0.340 - 5.600 uIU/mL Final  CT scan of the abdomen and pelvis completed 01/05/2015 was independently reviewed.  FINDINGS:  The lung bases are unremarkable. Sagittal images of the spine shows  degenerative changes thoracolumbar spine.  There are streaky artifacts from patient's large body habitus. There  is oral contrast material within distal esophagus. Thickening of  distal esophageal wall suspicious for reflux esophagitis. Clinical  correlation is necessary.  Unenhanced liver shows no biliary ductal dilatation. No calcified  gallstones are noted within gallbladder. Unenhanced pancreas, spleen  and adrenal glands are unremarkable. Unenhanced kidneys are  symmetrical in size. No nephrolithiasis. No hydronephrosis or    hydroureter. Atherosclerotic calcifications are noted bilateral  renal artery origin, SMA and celiac trunk origin, distal abdominal  aorta and bilateral common iliac arteries. No aortic aneurysm. There  is a umbilical hernia containing omental fat and tiny segment of  small bowel. There is no evidence of acute complication. The hernia  measures 6.7 cm.  There is a midline supraumbilical ventral hernia containing omental  fat and small bowel loops measures 12 by 5 cm.  There is no evidence  of acute complication. This is best seen in sagittal image 73.  No small bowel obstruction. No ascites or free air. No adenopathy.  There is no pericecal inflammation. Normal appendix is noted in  axial image 65. Scattered diverticula are noted descending colon and  sigmoid colon. There is no evidence of acute diverticulitis.  Redundant sigmoid colon. Prostate gland and seminal vesicles are  unremarkable. There is a left inguinal scrotal canal hernia  containing fat measures 2.5 cm without evidence of acute  complication. Urinary bladder is under distended. Degenerative  changes pubic symphysis. Degenerative changes bilateral SI joints.  Oral contrast material was given to the patient. No contrast  extravasation  Unenhanced kidneys are symmetrical in size. No nephrolithiasis. No  hydronephrosis or hydroureter. No calcified ureteral calculi.  IMPRESSION:  1. There is a umbilical hernia containing omental fat and short  segment of small bowel without evidence or complication. A midline  supraumbilical ventral hernia containing fat and small bowel loops  measures 12 x 5 cm. No evidence of acute complication. No small  bowel obstruction.  2. Normal appendix. No pericecal inflammation.  3. No small bowel obstruction. No ascites or free air.  4. No hydronephrosis or hydroureter. No nephrolithiasis.  5. Degenerative changes thoracolumbar spine and bilateral SI joints.  The fascial defect for the ventral  hernia is approximately 5 cm, while there is a large hernia sac evident. The fascial defect at the umbilicus is smaller estimated at 3-4 centers.  Assessment   Ventral and umbilical hernia.  Multiple medical comorbidities.   Plan   PCP notes from Governor Specking dated earlier this month were reviewed. No absolute contraindication for surgical intervention.  Hernia precautions and incarceration were discussed with the patient. If they develop symptoms of an incarcerated hernia, they were encouraged to seek prompt medical attention.  I have recommended repair of the hernia using mesh on an outpatient basis in the near future. The risk of infection was reviewed. The role of prosthetic mesh to minimize the risk of recurrence was reviewed.  Patient to bring his C-pap machine day of surgery.  Stop Pradaxa three days before surgery and start a baby aspirin five days before surgery.  We'll plan for a laparoscopic repair, but if necessary open repair will be undertaken. Laparoscopy is felt to be of benefit due to the patient's underlying pulmonary dysfunction, but with the large amount of contents it may be difficult to reduce and provide adequate closure.  Patient's surgery has been scheduled for 02-02-15 at [facility].  Restrictions after surgery were reviewed: No lifting over 10 pounds. No driving until pain-free.  Risks associated with cessation of anticoagulation therapy in regards to stroke were discussed.  PCP: Sol Passer  01/12/2015, 5:18 PM

## 2015-01-12 NOTE — Patient Instructions (Addendum)
Laparoscopic Ventral Hernia Repair °Laparoscopic ventral hernia repair is a surgery to fix a ventral hernia. A ventral hernia, also called an incisional hernia, is a bulge of body tissue or intestines that pushes through the front part of the abdomen. This can happen if the connective tissue covering the muscles over the abdomen has a weak spot or is torn because of a surgical cut (incision) from a previous surgery. Laparoscopic ventral hernia repair is often done soon after diagnosis to stop the hernia from getting bigger, becoming uncomfortable, or becoming an emergency. This surgery usually takes about 2 hours, but the time can vary greatly. °LET YOUR HEALTH CARE PROVIDER KNOW ABOUT: °· Any allergies you have. °· All medicines you are taking, including steroids, vitamins, herbs, eye drops, creams, and over-the-counter medicines. °· Previous problems you or members of your family have had with the use of anesthetics. °· Any blood disorders you have. °· Previous surgeries you have had. °· Medical conditions you have. °RISKS AND COMPLICATIONS  °Generally, laparoscopic ventral hernia repair is a safe procedure. However, as with any surgical procedure, problems can occur. Possible problems include: °· Bleeding. °· Trouble passing urine or having a bowel movement after the surgery. °· Infection. °· Pneumonia. °· Blood clots. °· Pain in the area of the hernia. °· A bulge in the area of the hernia that may be caused by a collection of fluid. °· Injury to intestines or other structures in the abdomen. °· Return of the hernia after surgery. °In some cases, your health care provider may need to stop the laparoscopic procedure and do regular, open surgery. This may be necessary for very difficult hernias, when organs are hard to see, or when bleeding problems occur during surgery. °BEFORE THE PROCEDURE  °· You may need to have blood tests, urine tests, a chest X-ray, or an electrocardiogram done before the day of the  surgery. °· Ask your health care provider about changing or stopping your regular medicines. This is especially important if you are taking diabetes medicines or blood thinners. °· You may need to wash with a special type of germ-killing soap. °· Do not eat or drink anything after midnight the night before the procedure or as directed by your health care provider. °· Make plans to have someone drive you home after the procedure. °PROCEDURE  °· Small monitors will be put on your body. They are used to check your heart, blood pressure, and oxygen level. °· An IV access tube will be put into a vein in your hand or arm. Fluids and medicine will flow directly into your body through the IV tube. °· You will be given medicine that makes you go to sleep (general anesthetic). °· Your abdomen will be cleaned with a special soap to kill any germs on your skin. °· Once you are asleep, several small incisions will be made in your abdomen. °· The large space in your abdomen will be filled with air so that it expands. This gives your health care provider more room and a better view. °· A thin, lighted tube with a tiny camera on the end (laparoscope) is put through a small incision in your abdomen. The camera on the laparoscope sends a picture to a TV screen in the operating room. This gives your health care provider a good view inside your abdomen. °· Hollow tubes are put through the other small incisions in your abdomen. The tools needed for the procedure are put through these tubes. °· Your health care provider   puts the tissue or intestines that formed the hernia back in place.  A screen-like patch (mesh) is used to close the hernia. This helps make the area stronger. Stitches, tacks, or staples are used to keep the mesh in place.  Medicine and a bandage (dressing) or skin glue will be put over the incisions. AFTER THE PROCEDURE   You will stay in a recovery area until the anesthetic wears off. Your blood pressure and  pulse will be checked often.  You may be able to go home the same day or may need to stay in the hospital for 1-2 days after surgery. Your health care provider will decide when you can go home.  You may feel some pain. You may be given medicine for pain.  You will be urged to do breathing exercises that involve taking deep breaths. This helps prevent a lung infection after a surgery.  You may have to wear compression stockings while you are in the hospital. These stockings help keep blood clots from forming in your legs.   This information is not intended to replace advice given to you by your health care provider. Make sure you discuss any questions you have with your health care provider.   Document Released: 01/18/2012 Document Revised: 02/05/2013 Document Reviewed: 01/18/2012 Elsevier Interactive Patient Education [employer].   Patient to bring his C-pap machine day of surgery. Stop the PRADAXA three days before surgery and start a baby aspirin five days before surgery.  Patient's surgery has been scheduled for 02-02-15 at [facility].

## 2015-01-22 ENCOUNTER — Encounter
Admission: RE | Admit: 2015-01-22 | Discharge: 2015-01-22 | Disposition: A | Discharge status: Home / Self Care | Payer: PPO | Source: Ambulatory Visit | Attending: General Surgery | Admitting: General Surgery

## 2015-01-22 DIAGNOSIS — Z01812 Encounter for preprocedural laboratory examination: Secondary | ICD-10-CM | POA: Insufficient documentation

## 2015-01-22 HISTORY — DX: Hypothyroidism, unspecified: E03.9

## 2015-01-22 LAB — BASIC METABOLIC PANEL
ANION GAP: 8 (ref 5–15)
BUN: 25 mg/dL — AB (ref 6–20)
CHLORIDE: 105 mmol/L (ref 101–111)
CO2: 28 mmol/L (ref 22–32)
Calcium: 9.1 mg/dL (ref 8.9–10.3)
Creatinine, Ser: 1.31 mg/dL — ABNORMAL HIGH (ref 0.61–1.24)
GFR calc Af Amer: >60 mL/min (ref >60)
GFR, EST NON AFRICAN AMERICAN: 52 mL/min — AB (ref >60)
GLUCOSE: 104 mg/dL — AB (ref 65–99)
POTASSIUM: 4.2 mmol/L (ref 3.5–5.1)
SODIUM: 141 mmol/L (ref 135–145)

## 2015-01-22 LAB — CBC
HCT: 41.1 % (ref 40.0–52.0)
HEMOGLOBIN: 13.4 g/dL (ref 13.0–18.0)
MCH: 30.8 pg (ref 26.0–34.0)
MCHC: 32.6 g/dL (ref 32.0–36.0)
MCV: 94.3 fL (ref 80.0–100.0)
PLATELETS: 229 10*3/uL (ref 150–440)
RBC: 4.36 MIL/uL — AB (ref 4.40–5.90)
RDW: 20.2 % — ABNORMAL HIGH (ref 11.5–14.5)
WBC: 8 10*3/uL (ref 3.8–10.6)

## 2015-01-22 NOTE — Patient Instructions (Signed)
  Your procedure is scheduled on: Monday 02/02/2015 Report to Day Surgery. 2ND FLOOR MEDICAL MALL ENTRANCE To find out your arrival time please call [phone] between 1PM - 3PM on Friday 01/30/2015.  Remember: Instructions that are not followed completely may result in serious medical risk, up to and including death, or upon the discretion of your surgeon and anesthesiologist your surgery may need to be rescheduled.    __X__ 1. Do not eat food or drink liquids after midnight. No gum chewing or hard candies.     __X__ 2. No Alcohol for 24 hours before or after surgery.   ____ 3. Bring all medications with you on the day of surgery if instructed.    __X__ 4. Notify your doctor if there is any change in your medical condition     (cold, fever, infections).     Do not wear jewelry, make-up, hairpins, clips or nail polish.  Do not wear lotions, powders, or perfumes.   Do not shave 48 hours prior to surgery. Men may shave face and neck.  Do not bring valuables to the hospital.    [facility] is not responsible for any belongings or valuables.               Contacts, dentures or bridgework may not be worn into surgery.  Leave your suitcase in the car. After surgery it may be brought to your room.  For patients admitted to the hospital, discharge time is determined by your                treatment team.   Patients discharged the day of surgery will not be allowed to drive home.   Please read over the following fact sheets that you were given:   Surgical Site Infection Prevention   __X__ Take these medicines the morning of surgery with A SIP OF WATER:    1. AMIODARONE  2. LEVOTHYROXINE  3. METOPROLOL  4.OMEPRAZOLE  5.  6.  ____ Fleet Enema (as directed)   __X__ Use CHG Soap as directed  ____ Use inhalers on the day of surgery  ____ Stop metformin 2 days prior to surgery    ____ Take 1/2 of usual insulin dose the night before surgery and none on the morning of surgery.    __X__ Stop PRADAXA ON 01/30/2015 AND START ASPIRIN 81 MG ON 01/28/2015 AS DIRECTED BY DR BYRNETT  ____ Stop Anti-inflammatories on    __X__ Stop supplements until after surgery.    __X__ Bring C-Pap to the hospital.

## 2015-01-23 NOTE — OR Nursing (Signed)
Reviewed medical history and recent ECG with Dr. Laureen Abrahams, [place] to proceed with ventral/umbilical hernia repair.

## 2015-02-02 ENCOUNTER — Encounter
Admission: RE | Disposition: A | Discharge status: Home / Self Care | Payer: Self-pay | Source: Ambulatory Visit | Attending: General Surgery

## 2015-02-02 ENCOUNTER — Ambulatory Visit
Admission: RE | Admit: 2015-02-02 | Discharge: 2015-02-02 | Disposition: A | Discharge status: Home / Self Care | Payer: PPO | Source: Ambulatory Visit | Attending: General Surgery | Admitting: General Surgery

## 2015-02-02 ENCOUNTER — Ambulatory Visit: Payer: PPO | Admitting: Anesthesiology

## 2015-02-02 ENCOUNTER — Encounter: Payer: Self-pay | Admitting: *Deleted

## 2015-02-02 DIAGNOSIS — J449 Chronic obstructive pulmonary disease, unspecified: Secondary | ICD-10-CM | POA: Diagnosis not present

## 2015-02-02 DIAGNOSIS — I119 Hypertensive heart disease without heart failure: Secondary | ICD-10-CM | POA: Insufficient documentation

## 2015-02-02 DIAGNOSIS — K429 Umbilical hernia without obstruction or gangrene: Secondary | ICD-10-CM | POA: Insufficient documentation

## 2015-02-02 DIAGNOSIS — G4730 Sleep apnea, unspecified: Secondary | ICD-10-CM | POA: Insufficient documentation

## 2015-02-02 DIAGNOSIS — Z79899 Other long term (current) drug therapy: Secondary | ICD-10-CM | POA: Insufficient documentation

## 2015-02-02 DIAGNOSIS — E079 Disorder of thyroid, unspecified: Secondary | ICD-10-CM | POA: Insufficient documentation

## 2015-02-02 DIAGNOSIS — N289 Disorder of kidney and ureter, unspecified: Secondary | ICD-10-CM | POA: Insufficient documentation

## 2015-02-02 DIAGNOSIS — I4891 Unspecified atrial fibrillation: Secondary | ICD-10-CM | POA: Insufficient documentation

## 2015-02-02 DIAGNOSIS — K439 Ventral hernia without obstruction or gangrene: Secondary | ICD-10-CM | POA: Diagnosis not present

## 2015-02-02 DIAGNOSIS — Z9889 Other specified postprocedural states: Secondary | ICD-10-CM | POA: Insufficient documentation

## 2015-02-02 DIAGNOSIS — Z8673 Personal history of transient ischemic attack (TIA), and cerebral infarction without residual deficits: Secondary | ICD-10-CM | POA: Diagnosis not present

## 2015-02-02 DIAGNOSIS — Z888 Allergy status to other drugs, medicaments and biological substances status: Secondary | ICD-10-CM | POA: Diagnosis not present

## 2015-02-02 DIAGNOSIS — Z87891 Personal history of nicotine dependence: Secondary | ICD-10-CM | POA: Diagnosis not present

## 2015-02-02 DIAGNOSIS — E785 Hyperlipidemia, unspecified: Secondary | ICD-10-CM | POA: Diagnosis not present

## 2015-02-02 HISTORY — PX: HERNIA REPAIR: SHX51

## 2015-02-02 HISTORY — PX: VENTRAL HERNIA REPAIR: SHX424

## 2015-02-02 HISTORY — PX: UMBILICAL HERNIA REPAIR: SHX196

## 2015-02-02 HISTORY — DX: Failed or difficult intubation, initial encounter: T88.4XXA

## 2015-02-02 LAB — GLUCOSE, CAPILLARY: Glucose-Capillary: 91 mg/dL (ref 65–99)

## 2015-02-02 SURGERY — REPAIR, HERNIA, VENTRAL, LAPAROSCOPIC
Anesthesia: General | Wound class: Clean

## 2015-02-02 MED ORDER — ACETAMINOPHEN 10 MG/ML IV SOLN
INTRAVENOUS | Status: AC
  Filled 2015-02-02: qty 100

## 2015-02-02 MED ORDER — LACTATED RINGERS IV SOLN
INTRAVENOUS | Status: DC | PRN
Start: 2015-02-02 — End: 2015-02-02
  Administered 2015-02-02: 09:00:00 via INTRAVENOUS

## 2015-02-02 MED ORDER — IPRATROPIUM-ALBUTEROL 0.5-2.5 (3) MG/3ML IN SOLN
RESPIRATORY_TRACT | Status: AC
  Filled 2015-02-02: qty 3

## 2015-02-02 MED ORDER — ROCURONIUM BROMIDE 100 MG/10ML IV SOLN
INTRAVENOUS | Status: DC | PRN
  Administered 2015-02-02: 25 mg via INTRAVENOUS
  Administered 2015-02-02 (×2): 10 mg via INTRAVENOUS

## 2015-02-02 MED ORDER — SUCCINYLCHOLINE CHLORIDE 20 MG/ML IJ SOLN
INTRAMUSCULAR | Status: DC | PRN
  Administered 2015-02-02: 100 mg via INTRAVENOUS

## 2015-02-02 MED ORDER — NAPROXEN SODIUM 220 MG PO TABS: 440.0000 mg | ORAL_TABLET | Freq: Two times a day (BID) | ORAL | Status: DC

## 2015-02-02 MED ORDER — OXYCODONE HCL 5 MG PO TABS
5.0000 mg | ORAL_TABLET | Freq: Once | ORAL | Status: AC | PRN
  Administered 2015-02-02: 5 mg via ORAL

## 2015-02-02 MED ORDER — OXYCODONE HCL 5 MG/5ML PO SOLN: 5.0000 mg | Freq: Once | ORAL | Status: AC | PRN

## 2015-02-02 MED ORDER — KETOROLAC TROMETHAMINE 30 MG/ML IJ SOLN
INTRAMUSCULAR | Status: DC | PRN
  Administered 2015-02-02: 30 mg via INTRAVENOUS

## 2015-02-02 MED ORDER — PROPOFOL 10 MG/ML IV BOLUS
INTRAVENOUS | Status: DC | PRN
  Administered 2015-02-02: 150 mg via INTRAVENOUS

## 2015-02-02 MED ORDER — SUGAMMADEX SODIUM 500 MG/5ML IV SOLN
INTRAVENOUS | Status: DC | PRN
  Administered 2015-02-02: 500 mg via INTRAVENOUS

## 2015-02-02 MED ORDER — SODIUM CHLORIDE 0.9 % IV SOLN
INTRAVENOUS | Status: DC
  Administered 2015-02-02: 09:00:00 via INTRAVENOUS

## 2015-02-02 MED ORDER — REMIFENTANIL HCL 1 MG IV SOLR: INTRAVENOUS | Status: DC | PRN

## 2015-02-02 MED ORDER — FENTANYL CITRATE (PF) 100 MCG/2ML IJ SOLN
INTRAMUSCULAR | Status: DC | PRN
  Administered 2015-02-02: 100 ug via INTRAVENOUS

## 2015-02-02 MED ORDER — FENTANYL CITRATE (PF) 100 MCG/2ML IJ SOLN
INTRAMUSCULAR | Status: AC
  Filled 2015-02-02: qty 2

## 2015-02-02 MED ORDER — FENTANYL CITRATE (PF) 100 MCG/2ML IJ SOLN
25.0000 ug | INTRAMUSCULAR | Status: AC | PRN
  Administered 2015-02-02 (×6): 25 ug via INTRAVENOUS

## 2015-02-02 MED ORDER — PHENYLEPHRINE HCL 10 MG/ML IJ SOLN
INTRAMUSCULAR | Status: DC | PRN
  Administered 2015-02-02 (×6): 100 ug via INTRAVENOUS

## 2015-02-02 MED ORDER — BUPIVACAINE HCL (PF) 0.5 % IJ SOLN
INTRAMUSCULAR | Status: AC
  Filled 2015-02-02: qty 30

## 2015-02-02 MED ORDER — SODIUM BICARBONATE 4 % IV SOLN
INTRAVENOUS | Status: AC
  Filled 2015-02-02: qty 5

## 2015-02-02 MED ORDER — LIDOCAINE HCL (PF) 1 % IJ SOLN
INTRAMUSCULAR | Status: AC
  Filled 2015-02-02: qty 30

## 2015-02-02 MED ORDER — OXYCODONE HCL 5 MG PO TABS
ORAL_TABLET | ORAL | Status: AC
  Filled 2015-02-02: qty 1

## 2015-02-02 MED ORDER — REMIFENTANIL HCL 1 MG IV SOLR
INTRAVENOUS | Status: DC | PRN
  Administered 2015-02-02: .1 ug/kg/min via INTRAVENOUS

## 2015-02-02 MED ORDER — IPRATROPIUM-ALBUTEROL 0.5-2.5 (3) MG/3ML IN SOLN
3.0000 mL | Freq: Once | RESPIRATORY_TRACT | Status: AC
  Administered 2015-02-02: 3 mL via RESPIRATORY_TRACT

## 2015-02-02 MED ORDER — ACETAMINOPHEN 10 MG/ML IV SOLN
INTRAVENOUS | Status: DC | PRN
  Administered 2015-02-02: 1000 mg via INTRAVENOUS

## 2015-02-02 MED ORDER — ACETAMINOPHEN 500 MG PO TABS: 1000.0000 mg | ORAL_TABLET | Freq: Four times a day (QID) | ORAL | Status: DC

## 2015-02-02 MED ORDER — IPRATROPIUM-ALBUTEROL 0.5-2.5 (3) MG/3ML IN SOLN: 3.0000 mL | Freq: Once | RESPIRATORY_TRACT | Status: DC | PRN

## 2015-02-02 MED ORDER — DEXAMETHASONE SODIUM PHOSPHATE 10 MG/ML IJ SOLN
INTRAMUSCULAR | Status: DC | PRN
  Administered 2015-02-02: 5 mg via INTRAVENOUS

## 2015-02-02 MED ORDER — IPRATROPIUM-ALBUTEROL 0.5-2.5 (3) MG/3ML IN SOLN
RESPIRATORY_TRACT | Status: AC
  Administered 2015-02-02: 09:00:00
  Filled 2015-02-02: qty 3

## 2015-02-02 MED ORDER — DEXTROSE 5 % IV SOLN
3.0000 g | INTRAVENOUS | Status: AC
  Administered 2015-02-02: 3 g via INTRAVENOUS
  Filled 2015-02-02: qty 3000

## 2015-02-02 MED ORDER — ONDANSETRON HCL 4 MG/2ML IJ SOLN
INTRAMUSCULAR | Status: DC | PRN
  Administered 2015-02-02: 4 mg via INTRAVENOUS

## 2015-02-02 MED ORDER — OXYCODONE HCL 5 MG PO TABS: 5.0000 mg | ORAL_TABLET | ORAL | Status: DC | PRN

## 2015-02-02 SURGICAL SUPPLY — 56 items
BENZOIN TINCTURE PRP APPL 2/3 (GAUZE/BANDAGES/DRESSINGS) ×2 IMPLANT
BINDER ABDOMINAL 12 ML 46-62 (SOFTGOODS) ×2 IMPLANT
BLADE SURG 11 STRL SS SAFETY (MISCELLANEOUS) ×2 IMPLANT
BLADE SURG 15 STRL SS SAFETY (BLADE) ×2 IMPLANT
CANISTER SUCT 1200ML W/VALVE (MISCELLANEOUS) ×2 IMPLANT
CANNULA DILATOR 12 W/SLV (CANNULA) ×2 IMPLANT
CANNULA DILATOR 5 W/SLV (CANNULA) ×4 IMPLANT
CATH TRAY 16F METER LATEX (MISCELLANEOUS) ×2 IMPLANT
CHLORAPREP W/TINT 26ML (MISCELLANEOUS) ×2 IMPLANT
DEVICE SECURE STRAP 25 ABSORB (INSTRUMENTS) ×6 IMPLANT
DISSECTOR KITTNER STICK (MISCELLANEOUS) IMPLANT
DISSECTORS/KITTNER STICK (MISCELLANEOUS)
DRAPE LAPAROTOMY 100X77 ABD (DRAPES) ×2 IMPLANT
DRESSING TELFA 4X3 1S ST N-ADH (GAUZE/BANDAGES/DRESSINGS) ×2 IMPLANT
DRSG TEGADERM 2-3/8X2-3/4 SM (GAUZE/BANDAGES/DRESSINGS) IMPLANT
DRSG TEGADERM 2X2.25 PEDS (GAUZE/BANDAGES/DRESSINGS) ×2 IMPLANT
DRSG TEGADERM 4X4.75 (GAUZE/BANDAGES/DRESSINGS) IMPLANT
GAUZE SPONGE 4X4 12PLY STRL (GAUZE/BANDAGES/DRESSINGS) IMPLANT
GLOVE BIO SURGEON STRL SZ7.5 (GLOVE) ×4 IMPLANT
GLOVE INDICATOR 8.0 STRL GRN (GLOVE) ×4 IMPLANT
GOWN STRL REUS W/ TWL LRG LVL3 (GOWN DISPOSABLE) ×2 IMPLANT
GOWN STRL REUS W/TWL LRG LVL3 (GOWN DISPOSABLE) ×2
GRASPER SUT TROCAR 14GX15 (MISCELLANEOUS) ×2 IMPLANT
IV LACTATED RINGERS 1000ML (IV SOLUTION) IMPLANT
KIT RM TURNOVER STRD PROC AR (KITS) ×2 IMPLANT
LABEL OR SOLS (LABEL) IMPLANT
MESH VENT LT ST 17.8X22.9CM EL (Mesh General) ×2 IMPLANT
NDL SAFETY 22GX1.5 (NEEDLE) ×2 IMPLANT
NEEDLE HYPO 25X1 1.5 SAFETY (NEEDLE) ×2 IMPLANT
NS IRRIG 500ML POUR BTL (IV SOLUTION) ×2 IMPLANT
PACK BASIN MINOR ARMC (MISCELLANEOUS) ×2 IMPLANT
PACK LAP CHOLECYSTECTOMY (MISCELLANEOUS) ×2 IMPLANT
PAD ABD DERMACEA PRESS 5X9 (GAUZE/BANDAGES/DRESSINGS) ×8 IMPLANT
PAD GROUND ADULT SPLIT (MISCELLANEOUS) ×2 IMPLANT
SCISSORS METZENBAUM CVD 33 (INSTRUMENTS) ×2 IMPLANT
SEAL FOR SCOPE WARMER C3101 (MISCELLANEOUS) IMPLANT
SHEARS HARMONIC ACE PLUS 36CM (ENDOMECHANICALS) ×2 IMPLANT
STRIP CLOSURE SKIN 1/2X4 (GAUZE/BANDAGES/DRESSINGS) ×2 IMPLANT
SUT PDS PLUS 0 (SUTURE)
SUT PDS PLUS AB 0 CT-2 (SUTURE) IMPLANT
SUT PROLENE 0 CT 1 30 (SUTURE) IMPLANT
SUT PROLENE 0 CT 2 (SUTURE) ×6 IMPLANT
SUT SURGILON 0 BLK (SUTURE) IMPLANT
SUT VIC AB 0 SH 27 (SUTURE) ×2 IMPLANT
SUT VIC AB 3-0 54X BRD REEL (SUTURE) IMPLANT
SUT VIC AB 3-0 BRD 54 (SUTURE)
SUT VIC AB 3-0 SH 27 (SUTURE) ×1
SUT VIC AB 3-0 SH 27X BRD (SUTURE) ×1 IMPLANT
SUT VIC AB 4-0 FS2 27 (SUTURE) ×2 IMPLANT
SWABSTK COMLB BENZOIN TINCTURE (MISCELLANEOUS) ×2 IMPLANT
SYR 3ML LL SCALE MARK (SYRINGE) IMPLANT
SYR CONTROL 10ML (SYRINGE) ×2 IMPLANT
TAPE TRANSPORE STRL 2 31045 (GAUZE/BANDAGES/DRESSINGS) ×2 IMPLANT
TROCAR XCEL 12X100 BLDLESS (ENDOMECHANICALS) ×2 IMPLANT
TROCAR XCEL UNIV SLVE 11M 100M (ENDOMECHANICALS) IMPLANT
TUBING INSUFFLATOR HI FLOW (MISCELLANEOUS) ×2 IMPLANT

## 2015-02-02 NOTE — Discharge Instructions (Signed)
General Anesthesia, Adult °General anesthesia is a sleep-like state of non-feeling produced by medicines (anesthetics). General anesthesia prevents you from being alert and feeling pain during a medical procedure. Your caregiver may recommend general anesthesia if your procedure: °· Is long. °· Is painful or uncomfortable. °· Would be frightening to see or hear. °· Requires you to be still. °· Affects your breathing. °· Causes significant blood loss. °LET YOUR CAREGIVER KNOW ABOUT: °· Allergies to food or medicine. °· Medicines taken, including vitamins, herbs, eyedrops, over-the-counter medicines, and creams. °· Use of steroids (by mouth or creams). °· Previous problems with anesthetics or numbing medicines, including problems experienced by relatives. °· History of bleeding problems or blood clots. °· Previous surgeries and types of anesthetics received. °· Possibility of pregnancy, if this applies. °· Use of cigarettes, alcohol, or illegal drugs. °· Any health condition(s), especially diabetes, sleep apnea, and high blood pressure. °RISKS AND COMPLICATIONS °General anesthesia rarely causes complications. However, if complications do occur, they can be life threatening. Complications include: °· A lung infection. °· A stroke. °· A heart attack. °· Waking up during the procedure. When this occurs, the patient may be unable to move and communicate that he or she is awake. The patient may feel severe pain. °Older adults and adults with serious medical problems are more likely to have complications than adults who are young and healthy. Some complications can be prevented by answering all of your caregiver's questions thoroughly and by following all pre-procedure instructions. It is important to tell your caregiver if any of the pre-procedure instructions, especially those related to diet, were not followed. Any food or liquid in the stomach can cause problems when you are under general anesthesia. °BEFORE THE  PROCEDURE °· Ask your caregiver if you will have to spend the night at the hospital. If you will not have to spend the night, arrange to have an adult drive you and stay with you for 24 hours. °· Follow your caregiver's instructions if you are taking dietary supplements or medicines. Your caregiver may tell you to stop taking them or to reduce your dosage. °· Do not smoke for as long as possible before your procedure. If possible, stop smoking 3-6 weeks before the procedure. °· Do not take new dietary supplements or medicines within 1 week of your procedure unless your caregiver approves them. °· Do not eat within 8 hours of your procedure or as directed by your caregiver. Drink only clear liquids, such as water, black coffee (without milk or cream), and fruit juices (without pulp). °· Do not drink within 3 hours of your procedure or as directed by your caregiver. °· You may brush your teeth on the morning of the procedure, but make sure to spit out the toothpaste and water when finished. °PROCEDURE  °You will receive anesthetics through a mask, through an intravenous (IV) access tube, or through both. A doctor who specializes in anesthesia (anesthesiologist) or a nurse who specializes in anesthesia (nurse anesthetist) or both will stay with you throughout the procedure to make sure you remain unconscious. He or she will also watch your blood pressure, pulse, and oxygen levels to make sure that the anesthetics do not cause any problems. Once you are asleep, a breathing tube or mask may be used to help you breathe. °AFTER THE PROCEDURE °You will wake up after the procedure is complete. You may be in the room where the procedure was performed or in a recovery area. You may have a sore throat   if a breathing tube was used. You may also feel: °· Dizzy. °· Weak. °· Drowsy. °· Confused. °· Nauseous. °· Cold. °These are all normal responses and can be expected to last for up to 24 hours after the procedure is complete. A  caregiver will tell you when you are ready to go home. This will usually be when you are fully awake and in stable condition. °  °This information is not intended to replace advice given to you by your health care provider. Make sure you discuss any questions you have with your health care provider. °  °Document Released: 05/10/2007 Document Revised: 02/21/2014 Document Reviewed: 06/01/2011 °Elsevier Interactive Patient Education ©2016 Elsevier Inc. ° °

## 2015-02-02 NOTE — Anesthesia Preprocedure Evaluation (Addendum)
Anesthesia Evaluation  Patient identified by MRN, date of birth, ID band Patient awake    Reviewed: Allergy & Precautions, H&P , NPO status , Patient's Chart, lab work & pertinent test results  History of Anesthesia Complications Negative for: history of anesthetic complications  Airway Mallampati: III  TM Distance: >3 FB Neck ROM: limited    Dental  (+) Poor Dentition, Missing, Partial Lower, Partial Upper, Chipped   Pulmonary neg shortness of breath, sleep apnea , COPD, former smoker,    Pulmonary exam normal breath sounds clear to auscultation       Cardiovascular Exercise Tolerance: Good hypertension, (-) angina(-) Past MI and (-) DOE Normal cardiovascular exam Rhythm:regular Rate:Normal     Neuro/Psych CVA, Residual Symptoms negative psych ROS   GI/Hepatic negative GI ROS, Neg liver ROS,   Endo/Other  Hypothyroidism Morbid obesity  Renal/GU Renal disease  negative genitourinary   Musculoskeletal   Abdominal   Peds  Hematology negative hematology ROS (+)   Anesthesia Other Findings Past Medical History:   COPD (chronic obstructive pulmonary disease) (*              Sleep apnea                                                  Kidney disease                                               A-fib (HCC)                                                  Hyperlipidemia                                               Hypertension                                                 Stroke [facility])                                                 Thyroid disease                                              Hypothyroidism                                              Past Surgical History:   COLONOSCOPY  2008         TONSILLECTOMY                                                 vp shunt placement                               1979         HERNIA REPAIR                                                    Comment:bilateral inguinal hernia/ [facility]   CARDIAC CATHETERIZATION                                       CATARACT EXTRACTION W/ INTRAOCULAR LENS  IMPLA*              BMI    Body Mass Index   42.55 kg/m 2    Patient has medical clearance from PCP for this procedure.  Reproductive/Obstetrics negative OB ROS                           Anesthesia Physical Anesthesia Plan  ASA: III  Anesthesia Plan: General ETT   Post-op Pain Management:    Induction:   Airway Management Planned:   Additional Equipment:   Intra-op Plan:   Post-operative Plan:   Informed Consent: I have reviewed the patients History and Physical, chart, labs and discussed the procedure including the risks, benefits and alternatives for the proposed anesthesia with the patient or authorized representative who has indicated his/her understanding and acceptance.   Dental Advisory Given  Plan Discussed with: Anesthesiologist, CRNA and Surgeon  Anesthesia Plan Comments: (Patient informed that they are higher risk for complications from anesthesia during this procedure due to their medical history.  Patient voiced understanding. )       Anesthesia Quick Evaluation

## 2015-02-02 NOTE — Anesthesia Postprocedure Evaluation (Signed)
Anesthesia Post Note  Patient: Daniel Eaton.  Procedure(s) Performed: Procedure(s) (LRB): LAPAROSCOPIC VENTRAL HERNIA (N/A) HERNIA REPAIR UMBILICAL ADULT (N/A)  Patient location during evaluation: PACU Anesthesia Type: General Level of consciousness: awake and alert Pain management: pain level controlled Vital Signs Assessment: post-procedure vital signs reviewed and stable Respiratory status: spontaneous breathing, nonlabored ventilation, respiratory function stable and patient connected to nasal cannula oxygen Cardiovascular status: blood pressure returned to baseline and stable Postop Assessment: no signs of nausea or vomiting Anesthetic complications: no    Last Vitals:  Filed Vitals:   02/02/15 1222 02/02/15 1230  BP:  109/79  Pulse: 76 79  Temp:    Resp: 21 13    Last Pain: There were no vitals filed for this visit.               Precious Haws Mendel [place]ins[place]

## 2015-02-02 NOTE — Op Note (Signed)
Preoperative diagnosis: Ventral and umbilical hernia.  Postoperative diagnosis same.  Operative procedure: Laparoscopic repair of ventral and umbilical hernia with 18 x 28 cm Ventral light ST mesh.  Operating surgeon: Hervey Ard, M.D.  Anesthesia: Gen. endotracheal.  Estimated blood loss: Less than 30 mL.  Clinical note this 74 year old male has developed both umbilical and ventral hernia. They have become increasingly symptomatic. He is at higher risk for surgery based on his chronic pulmonary dysfunction and atrial fibrillation. Given options for management of open versus laparoscopic repair, he was felt to be a candidate for laparoscopic repair to minimize pulmonary dysfunction and the potential for bleeding with the reinstitution of his oral anticoagulation. The patient received Kefzol, 3 g intravenously prior to procedure. SCD stockings raised for DVT prevention.  Operative note: With the patient under adequate general endotracheal anesthesia the abdomen was prepped with ChloraPrep and draped. Hared previously been removed with clippers. A varies needle was placed in the left upper quadrant in the midaxillary line just below the costal margin. The hanging drop test was normal and the abdomen was insufflated with CO2 a 10 mmHg pressure. A 5 mm step port was expanded and inspection showed no evidence of injury from initial port placement. A 12 mm XL port was placed in the anterior axillary line and a 5 mm step port placed below this. There was noted to be omentum within both hernias. No involvement of the small or large intestine. The adhesions were taken down with the Harmonic scalpel. In spite of the fact that the patient's anticoagulation had been discontinued 3 days ago cautery alone was not adequate for hemostasis. After the defects were cleared the umbilical side was noticed to be 5 x 5 cm and the epigastric/ventral site 5 x 6 cm. There was a 3 cm bridge between these areas. The 18 x 28  ventral light mesh was chosen and passed through the 12 mm port site. The balloon was inflated and it was orientated longitudinally on the midline. A total of 8-0 Prolene sutures were placed beginning superiorly and then moving down the mesh to the hypogastrium to provide smooth tension coverage across. Ethicon secure strap tacks were used around the periphery to minimize the potential for small bowel entrapment as well as around the periphery of the fascial defects. Good hemostasis was noted. The abdomen was desufflated under direct vision after closure of the 12 mm port site with a 0 Prolene suture.  Skin incisions were closed with 4-0 Vicryl septic sutures. Benzoin and Steri-Strips were applied. During emergence the patient did have to coughing episodes and at least one clear "pop" was appreciated suggesting one of the transfixing sutures had torn. There was no obvious defect and it was elected not to reintroduced pneumoperitoneum for suture replacement.  An ABD pad, towel and 4 ring binder was then placed. The patient was taken taken to the recovery room in stable condition.

## 2015-02-02 NOTE — Transfer of Care (Signed)
Immediate Anesthesia Transfer of Care Note  Patient: Daniel Eaton.  Procedure(s) Performed: Procedure(s): LAPAROSCOPIC VENTRAL HERNIA (N/A) HERNIA REPAIR UMBILICAL ADULT (N/A)  Patient Location: PACU  Anesthesia Type:General  Level of Consciousness: awake, alert  and oriented  Airway & Oxygen Therapy: Patient Spontanous Breathing and Patient connected to face mask oxygen  Post-op Assessment: Report given to RN and Post -op Vital signs reviewed and stable  Post vital signs: Reviewed and stable  Last Vitals:  Filed Vitals:   02/02/15 0812  BP: 117/88  Pulse: 84  Temp: 36.1 C  Resp: 18    Complications: No apparent anesthesia complications

## 2015-02-02 NOTE — H&P (Signed)
No change in clinical condition or history. For ventral hernia repair with mesh reinforcement.

## 2015-02-02 NOTE — Anesthesia Procedure Notes (Signed)
Procedure Name: Intubation Date/Time: 02/02/2015 9:28 AM Performed by: [facility] Pre-anesthesia Checklist: Patient identified, Emergency Drugs available, Suction available and Patient being monitored Patient Re-evaluated:Patient Re-evaluated prior to inductionOxygen Delivery Method: Circle system utilized Preoxygenation: Pre-oxygenation with 100% oxygen Intubation Type: IV induction Ventilation: Mask ventilation without difficulty Laryngoscope Size: Glidescope and 4 (Lopro S4) Grade View: Grade I Tube type: Oral Tube size: 7.5 mm Number of attempts: 1 Airway Equipment and Method: Stylet and LTA kit utilized Placement Confirmation: ETT inserted through vocal cords under direct vision,  positive ETCO2,  CO2 detector and breath sounds checked- equal and bilateral Secured at: 21 cm Tube secured with: Tape Dental Injury: Teeth and Oropharynx as per pre-operative assessment  Difficulty Due To: Difficulty was anticipated, Difficult Airway- due to large tongue, Difficult Airway- due to anterior larynx, Difficult Airway- due to limited oral opening and Difficult Airway- due to dentition Future Recommendations: Recommend- induction with short-acting agent, and alternative techniques readily available

## 2015-02-03 ENCOUNTER — Encounter: Payer: Self-pay | Admitting: General Surgery

## 2015-02-03 ENCOUNTER — Telehealth: Payer: Self-pay | Admitting: General Surgery

## 2015-02-03 NOTE — Telephone Encounter (Signed)
PT CALLED TODAY AND HIS DISCHARGE ORDERS STATES FOR HIM TO BEGIN HIS [facility] ON Saturday.HE WANTED TO KNOW SINCE HE HAD BEGUN TO TAKE THE 81MG  ASPRIN ON 01-28-15,SHOULD HE STOP TAKING THE ASPRIN ON Saturday? PLEASE CALL PT @ [phone]

## 2015-02-03 NOTE — Telephone Encounter (Signed)
He takes the aspirin for 2 additional days then he may stop it, per Dr Bary Castilla. Patient aware and agrees.

## 2015-02-06 ENCOUNTER — Telehealth: Payer: Self-pay | Admitting: General Surgery

## 2015-02-06 NOTE — Telephone Encounter (Signed)
The patient called to report that his weight is up 10 pounds from surgery on Monday, December 19. He had also noted evidence of fluid at the hernia sites, as expected with the laparoscopic repair.  He reports he is eating well and having no difficulty with bowel or bladder function. He had skipped yesterday's dose of his diuretics, Aldactone and Demadex and he was instructed to take 2 doses today to get back to his regular regimen.  He'll call for any other changes develop. Ambulation was encouraged. He reports he has not required any narcotics.

## 2015-02-10 ENCOUNTER — Ambulatory Visit (INDEPENDENT_AMBULATORY_CARE_PROVIDER_SITE_OTHER): Payer: PPO | Admitting: *Deleted

## 2015-02-10 DIAGNOSIS — K439 Ventral hernia without obstruction or gangrene: Secondary | ICD-10-CM

## 2015-02-10 NOTE — Progress Notes (Signed)
Patient came in today for a wound check post ventral hernia. He states he is not wearing binder at this pint it was hurting him more with it on. He is not using any pain medication at this pint. He is only having tenderness. The wound is clean, with no signs of infection noted dressings removed remaining steri strips are in place, pt aware they will gradually fall off.  Bowels moving good .Follow up as scheduled next week.

## 2015-02-10 NOTE — Patient Instructions (Signed)
The patient is aware to call back for any questions or concerns.  

## 2015-02-17 ENCOUNTER — Encounter: Payer: Self-pay | Admitting: General Surgery

## 2015-02-17 ENCOUNTER — Ambulatory Visit (INDEPENDENT_AMBULATORY_CARE_PROVIDER_SITE_OTHER): Payer: PPO | Admitting: General Surgery

## 2015-02-17 VITALS — BP 134/72 | HR 76 | Resp 18 | Ht 71.0 in | Wt 310.0 lb

## 2015-02-17 DIAGNOSIS — K429 Umbilical hernia without obstruction or gangrene: Secondary | ICD-10-CM

## 2015-02-17 DIAGNOSIS — K439 Ventral hernia without obstruction or gangrene: Secondary | ICD-10-CM

## 2015-02-17 NOTE — Progress Notes (Signed)
Patient ID: Daniel Eaton., male   DOB: 1941-01-19, 75 y.o.   MRN: [id number]  Chief Complaint  Patient presents with  . Routine Post Op    ventral hernia     HPI Daniel Eaton. is a 75 y.o. male. here today for his post op ventral hernia repair done on 02/02/15. Patient states he is well. The patient did notice some swelling the week after surgery related to fluid accumulation through the mesh. This is been asymptomatic. His main site of discomfort has been an occasional twinge in the left lower quadrant inferior and posterior to the lowest port site. He did not require use of narcotics post surgery. He denied any pulmonary symptoms. No difficulty with bowel or bladder function.  I personally reviewed the patient's history.  HPI  Past Medical History  Diagnosis Date  . COPD (chronic obstructive pulmonary disease) (Daniel Eaton)   . Sleep apnea   . Kidney disease   . A-fib (Daniel Eaton)   . Hyperlipidemia   . Hypertension   . Stroke (Daniel Eaton)   . Thyroid disease   . Hypothyroidism   . Difficult intubation     Past Surgical History  Procedure Laterality Date  . Colonoscopy  2008  . Tonsillectomy    . Vp shunt placement  1979  . Cardiac catheterization    . Cataract extraction w/ intraocular lens  implant, bilateral    . Ventral hernia repair N/A 02/02/2015    Procedure: LAPAROSCOPIC VENTRAL HERNIA;  Surgeon: Robert Bellow, MD;  Location: [facility] ORS;  Service: General;  Laterality: N/A;  . Umbilical hernia repair N/A 02/02/2015    Procedure: HERNIA REPAIR UMBILICAL ADULT;  Surgeon: Robert Bellow, MD;  Location: [facility] ORS;  Service: General;  Laterality: N/A;  . Hernia repair      bilateral inguinal hernia/ Daniel Eaton  . Hernia repair  02/02/2015    18 x 28 cm ventral light mesh placed laparoscopically.    No family history on file.  Social History Social History  Substance Use Topics  . Smoking status: Former Research scientist (life sciences)  . Smokeless tobacco: Never Used  . Alcohol Use: No     Allergies  Allergen Reactions  . Diltiazem Hcl Itching    Current Outpatient Prescriptions  Medication Sig Dispense Refill  . acetaminophen (TYLENOL) 500 MG tablet Take 2 tablets (1,000 mg total) by mouth every 6 (six) hours. For baseline pain control. 100 tablet 0  . amiodarone (PACERONE) 200 MG tablet Take 200 mg by mouth daily.     Marland Kitchen aspirin 81 MG tablet Take 81 mg by mouth daily.    Marland Kitchen levothyroxine (SYNTHROID, LEVOTHROID) 75 MCG tablet Take 75 mcg by mouth daily.     Marland Kitchen loratadine (CLARITIN) 10 MG tablet Take 10 mg by mouth daily.    . metolazone (ZAROXOLYN) 2.5 MG tablet Take 2.5 mg by mouth daily as needed.     . metoprolol (LOPRESSOR) 100 MG tablet Take 100 mg by mouth 2 (two) times daily.     . Multiple Vitamin (MULTI-VITAMINS) TABS Take 1 tablet by mouth daily.     . naproxen sodium (ALEVE) 220 MG tablet Take 2 tablets (440 mg total) by mouth 2 (two) times daily with a meal. 100 tablet 0  . Omega-3 Fatty Acids (OMEGA 3 PO) Take 2 capsules by mouth daily.    Marland Kitchen omeprazole (PRILOSEC) 40 MG capsule Take 40 mg by mouth daily.     Marland Kitchen oxyCODONE (ROXICODONE) 5 MG immediate release tablet Take  1-2 tablets (5-10 mg total) by mouth every 4 (four) hours as needed for moderate pain or severe pain. Maximum 8/ day. 40 tablet 0  . potassium chloride (K-DUR) 10 MEQ tablet Take 10 mEq by mouth 2 (two) times daily.    Marland Kitchen spironolactone (ALDACTONE) 25 MG tablet Take 25 mg by mouth daily.     Marland Kitchen torsemide (DEMADEX) 20 MG tablet Take 20 mg by mouth 2 (two) times daily.      No current facility-administered medications for this visit.    Review of Systems Review of Systems  Constitutional: Negative.   Respiratory: Negative.   Cardiovascular: Negative.     Blood pressure 134/72, pulse 76, resp. rate 18, height 5\' 11"  (1.803 m), weight 310 lb (140.615 kg).  Physical Exam Physical Exam  Constitutional: He is oriented to person, place, and time. He appears well-developed and well-nourished.   Cardiovascular: Normal rate, regular rhythm and normal heart sounds.   1 plus edema   Pulmonary/Chest: Effort normal and breath sounds normal.  Abdominal: Soft. Normal appearance.    Incisions are clean and healing well .  Neurological: He is alert and oriented to person, place, and time.  Skin: Skin is warm and dry.      Assessment    Doing well post-laparoscopic repair of 2 ventral defects. Postoperative seroma, asymptomatic.     Plan    The patient should anticipate gradual resolution of the seroma. He has been encouraged to make use of his diuretics twice a day as recommended by his physicians. He had been skipping the evening dose to minimize trips to the restroom at night. Late afternoon, early evening dosing to minimize nocturia was discussed.  The patient would gradually increase his activity as tolerated.   Patient to return in one month.  PCP:  Daniel Eaton  This information has been scribed by Daniel Eaton CMA.    Robert Bellow 02/17/2015, 10:08 PM

## 2015-02-17 NOTE — Patient Instructions (Signed)
Patient to return  

## 2015-03-09 DIAGNOSIS — B351 Tinea unguium: Secondary | ICD-10-CM | POA: Diagnosis not present

## 2015-03-09 DIAGNOSIS — M79675 Pain in left toe(s): Secondary | ICD-10-CM | POA: Diagnosis not present

## 2015-03-09 DIAGNOSIS — M79674 Pain in right toe(s): Secondary | ICD-10-CM | POA: Diagnosis not present

## 2015-03-19 ENCOUNTER — Encounter: Payer: Self-pay | Admitting: General Surgery

## 2015-03-19 ENCOUNTER — Ambulatory Visit (INDEPENDENT_AMBULATORY_CARE_PROVIDER_SITE_OTHER): Payer: PPO | Admitting: General Surgery

## 2015-03-19 ENCOUNTER — Ambulatory Visit
Admission: RE | Admit: 2015-03-19 | Discharge: 2015-03-19 | Disposition: A | Discharge status: Home / Self Care | Payer: PPO | Source: Ambulatory Visit | Attending: General Surgery | Admitting: General Surgery

## 2015-03-19 ENCOUNTER — Encounter: Payer: Self-pay | Admitting: *Deleted

## 2015-03-19 VITALS — BP 130/82 | HR 70 | Temp 99.6°F | Resp 18 | Ht 71.0 in | Wt 302.0 lb

## 2015-03-19 DIAGNOSIS — J440 Chronic obstructive pulmonary disease with acute lower respiratory infection: Secondary | ICD-10-CM | POA: Diagnosis not present

## 2015-03-19 DIAGNOSIS — Z87891 Personal history of nicotine dependence: Secondary | ICD-10-CM | POA: Insufficient documentation

## 2015-03-19 DIAGNOSIS — J449 Chronic obstructive pulmonary disease, unspecified: Secondary | ICD-10-CM | POA: Diagnosis not present

## 2015-03-19 DIAGNOSIS — I10 Essential (primary) hypertension: Secondary | ICD-10-CM | POA: Insufficient documentation

## 2015-03-19 DIAGNOSIS — R05 Cough: Secondary | ICD-10-CM | POA: Insufficient documentation

## 2015-03-19 DIAGNOSIS — R918 Other nonspecific abnormal finding of lung field: Secondary | ICD-10-CM | POA: Insufficient documentation

## 2015-03-19 DIAGNOSIS — I517 Cardiomegaly: Secondary | ICD-10-CM | POA: Diagnosis not present

## 2015-03-19 DIAGNOSIS — K439 Ventral hernia without obstruction or gangrene: Secondary | ICD-10-CM

## 2015-03-19 DIAGNOSIS — E785 Hyperlipidemia, unspecified: Secondary | ICD-10-CM | POA: Insufficient documentation

## 2015-03-19 DIAGNOSIS — R0602 Shortness of breath: Secondary | ICD-10-CM | POA: Diagnosis not present

## 2015-03-19 DIAGNOSIS — R059 Cough, unspecified: Secondary | ICD-10-CM

## 2015-03-19 MED ORDER — AZITHROMYCIN 250 MG PO TABS: 250.0000 mg | ORAL_TABLET | ORAL | Status: DC

## 2015-03-19 MED ORDER — PREDNISONE 20 MG PO TABS: ORAL_TABLET | ORAL | Status: DC

## 2015-03-19 NOTE — Progress Notes (Signed)
Patient ID: Daniel Douberly., male   DOB: Mar 29, 1940, 75 y.o.   MRN: [id number]  Chief Complaint  Patient presents with  . Routine Post Op    ventral hernia    HPI Daniel Eaton. is a 75 y.o. male today for his post op ventral hernia repair done on 02/02/15. Patient states he is well HPI  Past Medical History  Diagnosis Date  . COPD (chronic obstructive pulmonary disease) ([place])   . Sleep apnea   . Kidney disease   . A-fib ([place])   . Hyperlipidemia   . Hypertension   . Stroke ([place])   . Thyroid disease   . Hypothyroidism   . Difficult intubation     Past Surgical History  Procedure Laterality Date  . Colonoscopy  2008  . Tonsillectomy    . Vp shunt placement  1979  . Cardiac catheterization    . Cataract extraction w/ intraocular lens  implant, bilateral    . Ventral hernia repair N/A 02/02/2015    Procedure: LAPAROSCOPIC VENTRAL HERNIA;  Surgeon: Robert Bellow, MD;  Location: [facility] ORS;  Service: General;  Laterality: N/A;  . Umbilical hernia repair N/A 02/02/2015    Procedure: HERNIA REPAIR UMBILICAL ADULT;  Surgeon: Robert Bellow, MD;  Location: [facility] ORS;  Service: General;  Laterality: N/A;  . Hernia repair      bilateral inguinal hernia/ [place]  . Hernia repair  02/02/2015    18 x 28 cm ventral light mesh placed laparoscopically.    No family history on file.  Social History Social History  Substance Use Topics  . Smoking status: Former Research scientist (life sciences)  . Smokeless tobacco: Never Used  . Alcohol Use: No    Allergies  Allergen Reactions  . Diltiazem Hcl Itching    Current Outpatient Prescriptions  Medication Sig Dispense Refill  . acetaminophen (TYLENOL) 500 MG tablet Take 2 tablets (1,000 mg total) by mouth every 6 (six) hours. For baseline pain control. 100 tablet 0  . amiodarone (PACERONE) 200 MG tablet Take 200 mg by mouth daily.     Marland Kitchen aspirin 81 MG tablet Take 81 mg by mouth daily.    Marland Kitchen azithromycin (ZITHROMAX) 250 MG tablet Take 1 tablet (250 mg  total) by mouth as directed. Two tablets day one, then one tablet daily until finished. 6 each 0  . levothyroxine (SYNTHROID, LEVOTHROID) 75 MCG tablet Take 75 mcg by mouth daily.     Marland Kitchen loratadine (CLARITIN) 10 MG tablet Take 10 mg by mouth daily.    . metolazone (ZAROXOLYN) 2.5 MG tablet Take 2.5 mg by mouth daily as needed.     . metoprolol (LOPRESSOR) 100 MG tablet Take 100 mg by mouth 2 (two) times daily.     . Multiple Vitamin (MULTI-VITAMINS) TABS Take 1 tablet by mouth daily.     . naproxen sodium (ALEVE) 220 MG tablet Take 2 tablets (440 mg total) by mouth 2 (two) times daily with a meal. 100 tablet 0  . Omega-3 Fatty Acids (OMEGA 3 PO) Take 2 capsules by mouth daily.    Marland Kitchen omeprazole (PRILOSEC) 40 MG capsule Take 40 mg by mouth daily.     Marland Kitchen oxyCODONE (ROXICODONE) 5 MG immediate release tablet Take 1-2 tablets (5-10 mg total) by mouth every 4 (four) hours as needed for moderate pain or severe pain. Maximum 8/ day. 40 tablet 0  . potassium chloride (K-DUR) 10 MEQ tablet Take 10 mEq by mouth 2 (two) times daily.    Marland Kitchen  predniSONE (DELTASONE) 20 MG tablet Three tablets daily for three days, then two tablets daily for three days, then one tablet daily until finished. 21 tablet 0  . spironolactone (ALDACTONE) 25 MG tablet Take 25 mg by mouth daily.     Marland Kitchen torsemide (DEMADEX) 20 MG tablet Take 20 mg by mouth 2 (two) times daily.      No current facility-administered medications for this visit.    Review of Systems Review of Systems  Constitutional: Negative.   Respiratory: Negative.   Cardiovascular: Negative.     Blood pressure 130/82, pulse 70, temperature 99.6 F (37.6 C), resp. rate 18, height 5\' 11"  (1.803 m), weight 302 lb (136.986 kg).  The patient's weight is down 3 pounds from his December visit.  Physical Exam Physical Exam  Constitutional: He is oriented to person, place, and time. He appears well-developed and well-nourished.  Cardiovascular: Normal rate, regular rhythm and  normal heart sounds.   Pulmonary/Chest: He has wheezes in the right upper field, the right middle field, the right lower field and the left lower field. He has no rhonchi. He has no rales.  Abdominal: Soft. Bowel sounds are normal.    Mild bruising  below umbilical area  Neurological: He is alert and oriented to person, place, and time.  Skin: Skin is warm and dry.    Data Reviewed Chest x-ray obtained today:  IMPRESSION: Enlargement of cardiac silhouette.  Mild hyperinflation and peribronchial thickening question COPD.  No acute infiltrate.   Assessment    Doing well status post laparoscopic repair of ventral hernia. Gradual resolution of seroma.  Acute bronchitis with [place] sputum and fever.    Plan    I spoke with the patient's primary physician, Frazier Richards M.D. regarding his clinical exam and progressive cough with purulent sputum. He recommended that the patient be placed on a prednisone taper, 20 mg tablets: 3 tablets per day for 3 days, 2 tablets per day for 3 days then 1 tablet per day for 3 days. Zithromax was also recommended as well as a PA and lateral chest x-ray. These prescriptions were forwarded to the patient's pharmacy and the chest x-ray results are noted above.  Dr. Tonette Bihari office will contact the patient for reexamination on 03/20/2015.  Patient to have a chest x-ray at [place] today. Prednisone taper.  Follow up in 3 months.    PCP:  [name] This information has been scribed by Gaspar Cola CMA.    Robert Bellow 03/20/2015, 8:04 AM

## 2015-03-19 NOTE — Patient Instructions (Addendum)
Patient to return 3 months  Chest x-ray today.

## 2015-03-20 DIAGNOSIS — I482 Chronic atrial fibrillation: Secondary | ICD-10-CM | POA: Diagnosis not present

## 2015-03-20 DIAGNOSIS — R059 Cough, unspecified: Secondary | ICD-10-CM | POA: Insufficient documentation

## 2015-03-20 DIAGNOSIS — R05 Cough: Secondary | ICD-10-CM | POA: Insufficient documentation

## 2015-03-20 DIAGNOSIS — J440 Chronic obstructive pulmonary disease with acute lower respiratory infection: Secondary | ICD-10-CM | POA: Insufficient documentation

## 2015-03-20 DIAGNOSIS — I429 Cardiomyopathy, unspecified: Secondary | ICD-10-CM | POA: Diagnosis not present

## 2015-03-20 DIAGNOSIS — J40 Bronchitis, not specified as acute or chronic: Secondary | ICD-10-CM | POA: Diagnosis not present

## 2015-03-20 DIAGNOSIS — J42 Unspecified chronic bronchitis: Secondary | ICD-10-CM | POA: Diagnosis not present

## 2015-06-09 DIAGNOSIS — I129 Hypertensive chronic kidney disease with stage 1 through stage 4 chronic kidney disease, or unspecified chronic kidney disease: Secondary | ICD-10-CM | POA: Diagnosis not present

## 2015-06-09 DIAGNOSIS — L918 Other hypertrophic disorders of the skin: Secondary | ICD-10-CM | POA: Diagnosis not present

## 2015-06-09 DIAGNOSIS — I482 Chronic atrial fibrillation: Secondary | ICD-10-CM | POA: Diagnosis not present

## 2015-06-09 DIAGNOSIS — D3614 Benign neoplasm of peripheral nerves and autonomic nervous system of thorax: Secondary | ICD-10-CM | POA: Diagnosis not present

## 2015-06-09 DIAGNOSIS — E039 Hypothyroidism, unspecified: Secondary | ICD-10-CM | POA: Diagnosis not present

## 2015-06-09 DIAGNOSIS — D225 Melanocytic nevi of trunk: Secondary | ICD-10-CM | POA: Diagnosis not present

## 2015-06-09 DIAGNOSIS — N183 Chronic kidney disease, stage 3 (moderate): Secondary | ICD-10-CM | POA: Diagnosis not present

## 2015-06-09 DIAGNOSIS — L821 Other seborrheic keratosis: Secondary | ICD-10-CM | POA: Diagnosis not present

## 2015-06-09 DIAGNOSIS — I429 Cardiomyopathy, unspecified: Secondary | ICD-10-CM | POA: Diagnosis not present

## 2015-06-16 ENCOUNTER — Encounter: Payer: Self-pay | Admitting: General Surgery

## 2015-06-16 ENCOUNTER — Ambulatory Visit (INDEPENDENT_AMBULATORY_CARE_PROVIDER_SITE_OTHER): Payer: PPO | Admitting: General Surgery

## 2015-06-16 VITALS — BP 110/78 | HR 80 | Resp 16 | Ht 71.0 in | Wt 305.0 lb

## 2015-06-16 DIAGNOSIS — I429 Cardiomyopathy, unspecified: Secondary | ICD-10-CM | POA: Diagnosis not present

## 2015-06-16 DIAGNOSIS — K439 Ventral hernia without obstruction or gangrene: Secondary | ICD-10-CM

## 2015-06-16 DIAGNOSIS — N183 Chronic kidney disease, stage 3 (moderate): Secondary | ICD-10-CM | POA: Diagnosis not present

## 2015-06-16 DIAGNOSIS — I129 Hypertensive chronic kidney disease with stage 1 through stage 4 chronic kidney disease, or unspecified chronic kidney disease: Secondary | ICD-10-CM | POA: Diagnosis not present

## 2015-06-16 DIAGNOSIS — E039 Hypothyroidism, unspecified: Secondary | ICD-10-CM | POA: Diagnosis not present

## 2015-06-16 DIAGNOSIS — I482 Chronic atrial fibrillation: Secondary | ICD-10-CM | POA: Diagnosis not present

## 2015-06-16 DIAGNOSIS — Z6841 Body Mass Index (BMI) 40.0 and over, adult: Secondary | ICD-10-CM | POA: Diagnosis not present

## 2015-06-16 DIAGNOSIS — E7800 Pure hypercholesterolemia, unspecified: Secondary | ICD-10-CM | POA: Diagnosis not present

## 2015-06-16 DIAGNOSIS — J42 Unspecified chronic bronchitis: Secondary | ICD-10-CM | POA: Diagnosis not present

## 2015-06-16 NOTE — Patient Instructions (Signed)
The patient is aware to call back for any questions or concerns.  

## 2015-06-16 NOTE — Progress Notes (Signed)
Patient ID: Daniel Nan., male   DOB: Jun 18, 1940, 75 y.o.   MRN: [id number]  Chief Complaint  Patient presents with  . Follow-up    HPI Daniel Dorst. is a 75 y.o. male.  Here today for follow up from ventral hernia repair done in December 21016. He states he is doing well. He has recovered from bronchitis and just had his follow up office visit with Dr. Frazier Richards this morning.   HPI  Past Medical History  Diagnosis Date  . COPD (chronic obstructive pulmonary disease) ([place])   . Sleep apnea   . Kidney disease   . A-fib ([place])   . Hyperlipidemia   . Hypertension   . Stroke ([place])   . Thyroid disease   . Hypothyroidism   . Difficult intubation     Past Surgical History  Procedure Laterality Date  . Colonoscopy  2008  . Tonsillectomy    . Vp shunt placement  1979  . Cardiac catheterization    . Cataract extraction w/ intraocular lens  implant, bilateral    . Ventral hernia repair N/A 02/02/2015    Procedure: LAPAROSCOPIC VENTRAL HERNIA;  Surgeon: Robert Bellow, MD;  Location: [facility] ORS;  Service: General;  Laterality: N/A;  . Umbilical hernia repair N/A 02/02/2015    Procedure: HERNIA REPAIR UMBILICAL ADULT;  Surgeon: Robert Bellow, MD;  Location: [facility] ORS;  Service: General;  Laterality: N/A;  . Hernia repair      bilateral inguinal hernia/ [place]  . Hernia repair  02/02/2015    18 x 28 cm ventral light mesh placed laparoscopically.    No family history on file.  Social History Social History  Substance Use Topics  . Smoking status: Former Research scientist (life sciences)  . Smokeless tobacco: Never Used  . Alcohol Use: No    Allergies  Allergen Reactions  . Diltiazem Hcl Itching    Current Outpatient Prescriptions  Medication Sig Dispense Refill  . acetaminophen (TYLENOL) 500 MG tablet Take 2 tablets (1,000 mg total) by mouth every 6 (six) hours. For baseline pain control. 100 tablet 0  . amiodarone (PACERONE) 200 MG tablet Take 200 mg by mouth daily.     Marland Kitchen  aspirin 81 MG tablet Take 81 mg by mouth daily.    Marland Kitchen levothyroxine (SYNTHROID, LEVOTHROID) 75 MCG tablet Take 75 mcg by mouth daily.     Marland Kitchen loratadine (CLARITIN) 10 MG tablet Take 10 mg by mouth daily.    . metolazone (ZAROXOLYN) 2.5 MG tablet Take 2.5 mg by mouth daily as needed.     . metoprolol (LOPRESSOR) 100 MG tablet Take 100 mg by mouth 2 (two) times daily.     . Multiple Vitamin (MULTI-VITAMINS) TABS Take 1 tablet by mouth daily.     . naproxen sodium (ALEVE) 220 MG tablet Take 2 tablets (440 mg total) by mouth 2 (two) times daily with a meal. 100 tablet 0  . Omega-3 Fatty Acids (OMEGA 3 PO) Take 2 capsules by mouth daily.    Marland Kitchen omeprazole (PRILOSEC) 40 MG capsule Take 40 mg by mouth daily.     . potassium chloride (K-DUR) 10 MEQ tablet Take 10 mEq by mouth 2 (two) times daily.    . predniSONE (DELTASONE) 20 MG tablet Three tablets daily for three days, then two tablets daily for three days, then one tablet daily until finished. 21 tablet 0  . spironolactone (ALDACTONE) 25 MG tablet Take 25 mg by mouth daily.     Marland Kitchen torsemide (  DEMADEX) 20 MG tablet Take 20 mg by mouth 2 (two) times daily.      No current facility-administered medications for this visit.    Review of Systems Review of Systems  Blood pressure 110/78, pulse 80, resp. rate 16, height 5\' 11"  (1.803 m), weight 305 lb (138.347 kg).  Physical Exam Physical Exam  Constitutional: He is oriented to person, place, and time. He appears well-developed and well-nourished.  Abdominal: Soft. Normal appearance.    Hernia repair intact.  Neurological: He is alert and oriented to person, place, and time.  Skin: Skin is warm and dry.  Psychiatric: His behavior is normal.     Assessment    Doing well status post laparoscopic repair of ventral hernia.    Plan    Care with lifting reviewed.  Proper lifting technique discussed.    Follow up as needed. The patient is aware to call back for any questions or  concerns.    PCP:  [name] This information has been scribed by Karie Fetch RN, BSN,BC.    Robert Bellow 06/17/2015, 4:39 PM

## 2015-06-26 DIAGNOSIS — S5012XA Contusion of left forearm, initial encounter: Secondary | ICD-10-CM | POA: Diagnosis not present

## 2015-07-08 DIAGNOSIS — M79675 Pain in left toe(s): Secondary | ICD-10-CM | POA: Diagnosis not present

## 2015-07-08 DIAGNOSIS — M79674 Pain in right toe(s): Secondary | ICD-10-CM | POA: Diagnosis not present

## 2015-07-08 DIAGNOSIS — B351 Tinea unguium: Secondary | ICD-10-CM | POA: Diagnosis not present

## 2015-10-09 DIAGNOSIS — M79674 Pain in right toe(s): Secondary | ICD-10-CM | POA: Diagnosis not present

## 2015-10-09 DIAGNOSIS — B351 Tinea unguium: Secondary | ICD-10-CM | POA: Diagnosis not present

## 2015-10-09 DIAGNOSIS — M79675 Pain in left toe(s): Secondary | ICD-10-CM | POA: Diagnosis not present

## 2015-10-14 DIAGNOSIS — I429 Cardiomyopathy, unspecified: Secondary | ICD-10-CM | POA: Diagnosis not present

## 2015-10-14 DIAGNOSIS — N183 Chronic kidney disease, stage 3 (moderate): Secondary | ICD-10-CM | POA: Diagnosis not present

## 2015-10-14 DIAGNOSIS — I129 Hypertensive chronic kidney disease with stage 1 through stage 4 chronic kidney disease, or unspecified chronic kidney disease: Secondary | ICD-10-CM | POA: Diagnosis not present

## 2015-10-14 DIAGNOSIS — E7800 Pure hypercholesterolemia, unspecified: Secondary | ICD-10-CM | POA: Diagnosis not present

## 2015-10-14 DIAGNOSIS — I482 Chronic atrial fibrillation: Secondary | ICD-10-CM | POA: Diagnosis not present

## 2015-10-21 DIAGNOSIS — N183 Chronic kidney disease, stage 3 (moderate): Secondary | ICD-10-CM | POA: Diagnosis not present

## 2015-10-21 DIAGNOSIS — J42 Unspecified chronic bronchitis: Secondary | ICD-10-CM | POA: Diagnosis not present

## 2015-10-21 DIAGNOSIS — E039 Hypothyroidism, unspecified: Secondary | ICD-10-CM | POA: Diagnosis not present

## 2015-10-21 DIAGNOSIS — I429 Cardiomyopathy, unspecified: Secondary | ICD-10-CM | POA: Diagnosis not present

## 2015-10-21 DIAGNOSIS — Z6841 Body Mass Index (BMI) 40.0 and over, adult: Secondary | ICD-10-CM | POA: Diagnosis not present

## 2015-10-21 DIAGNOSIS — I482 Chronic atrial fibrillation: Secondary | ICD-10-CM | POA: Diagnosis not present

## 2015-10-21 DIAGNOSIS — E7800 Pure hypercholesterolemia, unspecified: Secondary | ICD-10-CM | POA: Diagnosis not present

## 2015-10-21 DIAGNOSIS — I129 Hypertensive chronic kidney disease with stage 1 through stage 4 chronic kidney disease, or unspecified chronic kidney disease: Secondary | ICD-10-CM | POA: Diagnosis not present

## 2015-10-29 DIAGNOSIS — M9903 Segmental and somatic dysfunction of lumbar region: Secondary | ICD-10-CM | POA: Diagnosis not present

## 2015-10-29 DIAGNOSIS — M9902 Segmental and somatic dysfunction of thoracic region: Secondary | ICD-10-CM | POA: Diagnosis not present

## 2015-10-29 DIAGNOSIS — M9907 Segmental and somatic dysfunction of upper extremity: Secondary | ICD-10-CM | POA: Diagnosis not present

## 2015-10-29 DIAGNOSIS — M545 Low back pain: Secondary | ICD-10-CM | POA: Diagnosis not present

## 2015-11-03 DIAGNOSIS — M9902 Segmental and somatic dysfunction of thoracic region: Secondary | ICD-10-CM | POA: Diagnosis not present

## 2015-11-03 DIAGNOSIS — M9903 Segmental and somatic dysfunction of lumbar region: Secondary | ICD-10-CM | POA: Diagnosis not present

## 2015-11-03 DIAGNOSIS — M545 Low back pain: Secondary | ICD-10-CM | POA: Diagnosis not present

## 2015-11-03 DIAGNOSIS — M9904 Segmental and somatic dysfunction of sacral region: Secondary | ICD-10-CM | POA: Diagnosis not present

## 2015-11-12 DIAGNOSIS — M9904 Segmental and somatic dysfunction of sacral region: Secondary | ICD-10-CM | POA: Diagnosis not present

## 2015-11-12 DIAGNOSIS — M9902 Segmental and somatic dysfunction of thoracic region: Secondary | ICD-10-CM | POA: Diagnosis not present

## 2015-11-12 DIAGNOSIS — M545 Low back pain: Secondary | ICD-10-CM | POA: Diagnosis not present

## 2015-11-12 DIAGNOSIS — M9903 Segmental and somatic dysfunction of lumbar region: Secondary | ICD-10-CM | POA: Diagnosis not present

## 2015-11-12 DIAGNOSIS — M9907 Segmental and somatic dysfunction of upper extremity: Secondary | ICD-10-CM | POA: Diagnosis not present

## 2015-11-19 DIAGNOSIS — M9902 Segmental and somatic dysfunction of thoracic region: Secondary | ICD-10-CM | POA: Diagnosis not present

## 2015-11-19 DIAGNOSIS — M9904 Segmental and somatic dysfunction of sacral region: Secondary | ICD-10-CM | POA: Diagnosis not present

## 2015-11-19 DIAGNOSIS — M545 Low back pain: Secondary | ICD-10-CM | POA: Diagnosis not present

## 2015-11-19 DIAGNOSIS — M9907 Segmental and somatic dysfunction of upper extremity: Secondary | ICD-10-CM | POA: Diagnosis not present

## 2015-11-19 DIAGNOSIS — M9903 Segmental and somatic dysfunction of lumbar region: Secondary | ICD-10-CM | POA: Diagnosis not present

## 2015-12-03 DIAGNOSIS — M9907 Segmental and somatic dysfunction of upper extremity: Secondary | ICD-10-CM | POA: Diagnosis not present

## 2015-12-03 DIAGNOSIS — M545 Low back pain: Secondary | ICD-10-CM | POA: Diagnosis not present

## 2015-12-03 DIAGNOSIS — M9903 Segmental and somatic dysfunction of lumbar region: Secondary | ICD-10-CM | POA: Diagnosis not present

## 2015-12-03 DIAGNOSIS — M9902 Segmental and somatic dysfunction of thoracic region: Secondary | ICD-10-CM | POA: Diagnosis not present

## 2015-12-03 DIAGNOSIS — M9904 Segmental and somatic dysfunction of sacral region: Secondary | ICD-10-CM | POA: Diagnosis not present

## 2015-12-31 DIAGNOSIS — M9904 Segmental and somatic dysfunction of sacral region: Secondary | ICD-10-CM | POA: Diagnosis not present

## 2015-12-31 DIAGNOSIS — M9903 Segmental and somatic dysfunction of lumbar region: Secondary | ICD-10-CM | POA: Diagnosis not present

## 2015-12-31 DIAGNOSIS — M545 Low back pain: Secondary | ICD-10-CM | POA: Diagnosis not present

## 2015-12-31 DIAGNOSIS — M9902 Segmental and somatic dysfunction of thoracic region: Secondary | ICD-10-CM | POA: Diagnosis not present

## 2016-01-11 DIAGNOSIS — M79674 Pain in right toe(s): Secondary | ICD-10-CM | POA: Diagnosis not present

## 2016-01-11 DIAGNOSIS — M79675 Pain in left toe(s): Secondary | ICD-10-CM | POA: Diagnosis not present

## 2016-01-11 DIAGNOSIS — B351 Tinea unguium: Secondary | ICD-10-CM | POA: Diagnosis not present

## 2016-01-14 DIAGNOSIS — M545 Low back pain: Secondary | ICD-10-CM | POA: Diagnosis not present

## 2016-01-14 DIAGNOSIS — M9904 Segmental and somatic dysfunction of sacral region: Secondary | ICD-10-CM | POA: Diagnosis not present

## 2016-01-14 DIAGNOSIS — M9902 Segmental and somatic dysfunction of thoracic region: Secondary | ICD-10-CM | POA: Diagnosis not present

## 2016-01-14 DIAGNOSIS — M9903 Segmental and somatic dysfunction of lumbar region: Secondary | ICD-10-CM | POA: Diagnosis not present

## 2016-01-28 DIAGNOSIS — M9903 Segmental and somatic dysfunction of lumbar region: Secondary | ICD-10-CM | POA: Diagnosis not present

## 2016-01-28 DIAGNOSIS — M9904 Segmental and somatic dysfunction of sacral region: Secondary | ICD-10-CM | POA: Diagnosis not present

## 2016-01-28 DIAGNOSIS — M9902 Segmental and somatic dysfunction of thoracic region: Secondary | ICD-10-CM | POA: Diagnosis not present

## 2016-01-28 DIAGNOSIS — M545 Low back pain: Secondary | ICD-10-CM | POA: Diagnosis not present

## 2016-02-18 DIAGNOSIS — M9902 Segmental and somatic dysfunction of thoracic region: Secondary | ICD-10-CM | POA: Diagnosis not present

## 2016-02-18 DIAGNOSIS — M545 Low back pain: Secondary | ICD-10-CM | POA: Diagnosis not present

## 2016-02-18 DIAGNOSIS — M9904 Segmental and somatic dysfunction of sacral region: Secondary | ICD-10-CM | POA: Diagnosis not present

## 2016-02-18 DIAGNOSIS — M9903 Segmental and somatic dysfunction of lumbar region: Secondary | ICD-10-CM | POA: Diagnosis not present

## 2016-02-25 DIAGNOSIS — E7800 Pure hypercholesterolemia, unspecified: Secondary | ICD-10-CM | POA: Diagnosis not present

## 2016-02-25 DIAGNOSIS — N183 Chronic kidney disease, stage 3 (moderate): Secondary | ICD-10-CM | POA: Diagnosis not present

## 2016-02-25 DIAGNOSIS — I129 Hypertensive chronic kidney disease with stage 1 through stage 4 chronic kidney disease, or unspecified chronic kidney disease: Secondary | ICD-10-CM | POA: Diagnosis not present

## 2016-02-25 DIAGNOSIS — E039 Hypothyroidism, unspecified: Secondary | ICD-10-CM | POA: Diagnosis not present

## 2016-03-01 ENCOUNTER — Encounter: Payer: Self-pay | Admitting: *Deleted

## 2016-03-01 ENCOUNTER — Emergency Department: Payer: PPO

## 2016-03-01 ENCOUNTER — Emergency Department
Admission: EM | Admit: 2016-03-01 | Discharge: 2016-03-01 | Disposition: A | Discharge status: Home / Self Care | Payer: PPO | Attending: Emergency Medicine | Admitting: Emergency Medicine

## 2016-03-01 DIAGNOSIS — J449 Chronic obstructive pulmonary disease, unspecified: Secondary | ICD-10-CM | POA: Insufficient documentation

## 2016-03-01 DIAGNOSIS — I10 Essential (primary) hypertension: Secondary | ICD-10-CM | POA: Diagnosis not present

## 2016-03-01 DIAGNOSIS — Z7982 Long term (current) use of aspirin: Secondary | ICD-10-CM | POA: Diagnosis not present

## 2016-03-01 DIAGNOSIS — R0602 Shortness of breath: Secondary | ICD-10-CM | POA: Diagnosis not present

## 2016-03-01 DIAGNOSIS — Z791 Long term (current) use of non-steroidal anti-inflammatories (NSAID): Secondary | ICD-10-CM | POA: Insufficient documentation

## 2016-03-01 DIAGNOSIS — R0600 Dyspnea, unspecified: Secondary | ICD-10-CM | POA: Insufficient documentation

## 2016-03-01 DIAGNOSIS — E039 Hypothyroidism, unspecified: Secondary | ICD-10-CM | POA: Diagnosis not present

## 2016-03-01 DIAGNOSIS — Z79899 Other long term (current) drug therapy: Secondary | ICD-10-CM | POA: Insufficient documentation

## 2016-03-01 DIAGNOSIS — Z87891 Personal history of nicotine dependence: Secondary | ICD-10-CM | POA: Insufficient documentation

## 2016-03-01 HISTORY — DX: Heart failure, unspecified: I50.9

## 2016-03-01 LAB — URINALYSIS, COMPLETE (UACMP) WITH MICROSCOPIC
Bacteria, UA: NONE SEEN
Bilirubin Urine: NEGATIVE
GLUCOSE, UA: NEGATIVE mg/dL
HGB URINE DIPSTICK: NEGATIVE
KETONES UR: NEGATIVE mg/dL
LEUKOCYTES UA: NEGATIVE
Nitrite: NEGATIVE
PH: 6 (ref 5.0–8.0)
PROTEIN: NEGATIVE mg/dL
SQUAMOUS EPITHELIAL / LPF: NONE SEEN
Specific Gravity, Urine: 1.011 (ref 1.005–1.030)

## 2016-03-01 LAB — BASIC METABOLIC PANEL
ANION GAP: 8 (ref 5–15)
BUN: 33 mg/dL — ABNORMAL HIGH (ref 6–20)
CHLORIDE: 102 mmol/L (ref 101–111)
CO2: 29 mmol/L (ref 22–32)
Calcium: 8.8 mg/dL — ABNORMAL LOW (ref 8.9–10.3)
Creatinine, Ser: 1.42 mg/dL — ABNORMAL HIGH (ref 0.61–1.24)
GFR calc Af Amer: 54 mL/min — ABNORMAL LOW (ref >60)
GFR, EST NON AFRICAN AMERICAN: 47 mL/min — AB (ref >60)
Glucose, Bld: 109 mg/dL — ABNORMAL HIGH (ref 65–99)
Potassium: 4.2 mmol/L (ref 3.5–5.1)
SODIUM: 139 mmol/L (ref 135–145)

## 2016-03-01 LAB — CBC
HEMATOCRIT: 39.9 % — AB (ref 40.0–52.0)
Hemoglobin: 13.5 g/dL (ref 13.0–18.0)
MCH: 32.6 pg (ref 26.0–34.0)
MCHC: 33.8 g/dL (ref 32.0–36.0)
MCV: 96.3 fL (ref 80.0–100.0)
Platelets: 211 10*3/uL (ref 150–440)
RBC: 4.14 MIL/uL — AB (ref 4.40–5.90)
RDW: 18.7 % — ABNORMAL HIGH (ref 11.5–14.5)
WBC: 12.9 10*3/uL — AB (ref 3.8–10.6)

## 2016-03-01 LAB — INFLUENZA PANEL BY PCR (TYPE A & B)
INFLAPCR: NEGATIVE
INFLBPCR: NEGATIVE

## 2016-03-01 LAB — CK: Total CK: 79 U/L (ref 49–397)

## 2016-03-01 LAB — TROPONIN I: Troponin I: 0.03 ng/mL (ref <0.03)

## 2016-03-01 LAB — FIBRIN DERIVATIVES D-DIMER (ARMC ONLY): Fibrin derivatives D-dimer (ARMC): 426 (ref 0–499)

## 2016-03-01 MED ORDER — IOPAMIDOL (ISOVUE-370) INJECTION 76%
75.0000 mL | Freq: Once | INTRAVENOUS | Status: AC | PRN
  Administered 2016-03-01: 75 mL via INTRAVENOUS

## 2016-03-01 MED ORDER — MORPHINE SULFATE (PF) 2 MG/ML IV SOLN
2.0000 mg | Freq: Once | INTRAVENOUS | Status: AC
  Administered 2016-03-01: 2 mg via INTRAVENOUS
  Filled 2016-03-01: qty 1

## 2016-03-01 MED ORDER — LIDOCAINE 5 % EX PTCH
1.0000 | MEDICATED_PATCH | CUTANEOUS | Status: DC
  Administered 2016-03-01: 1 via TRANSDERMAL
  Filled 2016-03-01: qty 1

## 2016-03-01 MED ORDER — SODIUM CHLORIDE 0.9 % IV BOLUS (SEPSIS)
500.0000 mL | Freq: Once | INTRAVENOUS | Status: AC
  Administered 2016-03-01: 500 mL via INTRAVENOUS

## 2016-03-01 MED ORDER — LIDOCAINE 5 % EX PTCH: 1.0000 | MEDICATED_PATCH | CUTANEOUS | 0 refills | Status: DC

## 2016-03-01 NOTE — ED Provider Notes (Signed)
[facility] Emergency Department Provider Note   First MD Initiated Contact with Patient 03/01/16 [phone]     (approximate)  I have reviewed the triage vital signs and the nursing notes.   HISTORY  Chief Complaint Urinary Frequency and Shortness of Breath    HPI Daniel Eaton. is a 76 y.o. male presents via EMS with multiple medical complaints. Patient admits to dyspnea worse with any exertion. Patient denies any chest pain no dizziness no nausea or vomiting. In addition patient admits to urinary frequency however denies any dysuria   Past Medical History:  Diagnosis Date  . A-fib ([place])   . COPD (chronic obstructive pulmonary disease) ([place])   . Difficult intubation   . Hyperlipidemia   . Hypertension   . Hypothyroidism   . Kidney disease   . Sleep apnea   . Stroke ([place])   . Thyroid disease     Patient Active Problem List   Diagnosis Date Noted  . Cough 03/20/2015  . Bronchitis, chronic obstructive w acute bronchitis ([place]) 03/20/2015  . Umbilical hernia without obstruction and without gangrene 01/12/2015  . Ventral hernia without obstruction or gangrene 01/02/2015    Past Surgical History:  Procedure Laterality Date  . CARDIAC CATHETERIZATION    . CATARACT EXTRACTION W/ INTRAOCULAR LENS  IMPLANT, BILATERAL    . COLONOSCOPY  2008  . HERNIA REPAIR     bilateral inguinal hernia/ [place]  . HERNIA REPAIR  02/02/2015   18 x 28 cm ventral light mesh placed laparoscopically.  . TONSILLECTOMY    . UMBILICAL HERNIA REPAIR N/A 02/02/2015   Procedure: HERNIA REPAIR UMBILICAL ADULT;  Surgeon: Robert Bellow, MD;  Location: [facility] ORS;  Service: General;  Laterality: N/A;  . VENTRAL HERNIA REPAIR N/A 02/02/2015   Procedure: LAPAROSCOPIC VENTRAL HERNIA;  Surgeon: Robert Bellow, MD;  Location: [facility] ORS;  Service: General;  Laterality: N/A;  . vp shunt placement  1979    Prior to Admission medications   Medication Sig Start Date End Date  Taking? Authorizing Provider  acetaminophen (TYLENOL) 500 MG tablet Take 2 tablets (1,000 mg total) by mouth every 6 (six) hours. For baseline pain control. 02/02/15   Robert Bellow, MD  amiodarone (PACERONE) 200 MG tablet Take 200 mg by mouth daily.  02/20/14   Historical Provider, MD  aspirin 81 MG tablet Take 81 mg by mouth daily.    Historical Provider, MD  levothyroxine (SYNTHROID, LEVOTHROID) 75 MCG tablet Take 75 mcg by mouth daily.  02/20/14   Historical Provider, MD  loratadine (CLARITIN) 10 MG tablet Take 10 mg by mouth daily.    Historical Provider, MD  metolazone (ZAROXOLYN) 2.5 MG tablet Take 2.5 mg by mouth daily as needed.  02/20/14   Historical Provider, MD  metoprolol (LOPRESSOR) 100 MG tablet Take 100 mg by mouth 2 (two) times daily.  02/20/14   Historical Provider, MD  Multiple Vitamin (MULTI-VITAMINS) TABS Take 1 tablet by mouth daily.     Historical Provider, MD  naproxen sodium (ALEVE) 220 MG tablet Take 2 tablets (440 mg total) by mouth 2 (two) times daily with a meal. 02/02/15   Robert Bellow, MD  Omega-3 Fatty Acids (OMEGA 3 PO) Take 2 capsules by mouth daily.    Historical Provider, MD  omeprazole (PRILOSEC) 40 MG capsule Take 40 mg by mouth daily.  02/20/14   Historical Provider, MD  potassium chloride (K-DUR) 10 MEQ tablet Take 10 mEq by mouth 2 (two) times  daily.    Historical Provider, MD  predniSONE (DELTASONE) 20 MG tablet Three tablets daily for three days, then two tablets daily for three days, then one tablet daily until finished. 03/19/15   Robert Bellow, MD  spironolactone (ALDACTONE) 25 MG tablet Take 25 mg by mouth daily.  02/20/14   Historical Provider, MD  torsemide (DEMADEX) 20 MG tablet Take 20 mg by mouth 2 (two) times daily.  02/20/14   Historical Provider, MD    Allergies Diltiazem hcl  History reviewed. No pertinent family history.  Social History Social History  Substance Use Topics  . Smoking status: Former Research scientist (life sciences)  . Smokeless tobacco: Never  Used  . Alcohol use No    Review of Systems Constitutional: No fever/chills Eyes: No visual changes. ENT: No sore throat. Cardiovascular: Denies chest pain. Respiratory: Denies shortness of breath. Gastrointestinal: No abdominal pain.  No nausea, no vomiting.  No diarrhea.  No constipation. Genitourinary: Negative for dysuria. Musculoskeletal: Negative for back pain. Skin: Negative for rash. Neurological: Negative for headaches, focal weakness or numbness.  10-point ROS otherwise negative.  ____________________________________________   PHYSICAL EXAM:  VITAL SIGNS: ED Triage Vitals  Enc Vitals Group     BP 03/01/16 0601 133/71     Pulse Rate 03/01/16 0601 98     Resp 03/01/16 0601 16     Temp 03/01/16 0601 98.1 F (36.7 C)     Temp Source 03/01/16 0601 Oral     SpO2 03/01/16 0601 100 %     Weight 03/01/16 0602 (!) 320 lb (145.2 kg)     Height 03/01/16 0602 5\' 11"  (1.803 m)     Head Circumference --      Peak Flow --      Pain Score 03/01/16 0602 8     Pain Loc --      Pain Edu? --      Excl. in [place]? --     Constitutional: Alert and oriented. Well appearing and in no acute distress. Eyes: Conjunctivae are normal. PERRL. EOMI. Head: Atraumatic. Ears:  Healthy appearing ear canals and TMs bilaterally Nose: No congestion/rhinnorhea. Mouth/Throat: Mucous membranes are moist.  Oropharynx non-erythematous. Neck: No stridor.  No meningeal signs.  Cardiovascular: Normal rate, regular rhythm. Good peripheral circulation. Grossly normal heart sounds. Respiratory: Normal respiratory effort.  No retractions. Lungs CTAB. Gastrointestinal: Soft and nontender. No distention.  Musculoskeletal: No lower extremity tenderness nor edema. No gross deformities of extremities. Neurologic:  Normal speech and language. No gross focal neurologic deficits are appreciated.  Skin:  Skin is warm, dry and intact. No rash noted.   ____________________________________________   LABS (all  labs ordered are listed, but only abnormal results are displayed)  Labs Reviewed  CBC - Abnormal; Notable for the following:       Result Value   WBC 12.9 (*)    RBC 4.14 (*)    HCT 39.9 (*)    RDW 18.7 (*)    All other components within normal limits  BASIC METABOLIC PANEL  TROPONIN I  INFLUENZA PANEL BY PCR (TYPE A & B)  URINALYSIS, COMPLETE (UACMP) WITH MICROSCOPIC   ____________________________________________  EKG  ED ECG REPORT I, [place] Daryn Pisani, the attending physician, personally viewed and interpreted this ECG.   Date: 03/01/2016  EKG Time: 6:02 AM  Rate: 100  Rhythm: Atrial paced rhythm  Axis: Normal  Intervals: Normal  ST&T Change: None  Procedures   _   INITIAL IMPRESSION / ASSESSMENT AND PLAN / ED  COURSE  Pertinent labs & imaging results that were available during my care of the patient were reviewed by me and considered in my medical decision making (see chart for details).  Patient presents to the emergency department with multiple medical complaints as listed above. CT scan of the chest ordered secondary to symptoms of dyspnea. Patient's care transferred to Dr. Marcelene Butte   Clinical Course     ____________________________________________  FINAL CLINICAL IMPRESSION(S) / ED DIAGNOSES  Final diagnoses:  Dyspnea, unspecified type     MEDICATIONS GIVEN DURING THIS VISIT:  Medications - No data to display   NEW OUTPATIENT MEDICATIONS STARTED DURING THIS VISIT:  New Prescriptions   No medications on file    Modified Medications   No medications on file    Discontinued Medications   No medications on file     Note:  This document was prepared using Dragon voice recognition software and may include unintentional dictation errors.    Gregor Hams, MD 03/02/16 [phone]

## 2016-03-01 NOTE — Discharge Instructions (Signed)
Please return immediately if condition worsens. Please contact her primary physician or the physician you were given for referral. If you have any specialist physicians involved in her treatment and plan please also contact them. Thank you for using [facility] emergency [facility]. ° °

## 2016-03-01 NOTE — ED Provider Notes (Signed)
-----------------------------------------   10:26 AM on 03/01/2016 -----------------------------------------   Blood pressure 134/84, pulse 96, temperature 98.1 F (36.7 C), temperature source Oral, resp. rate 17, height 5\' 11"  (1.803 m), weight (!) 320 lb (145.2 kg), SpO2 99 %.  Assuming care from Dr. Owens Shark.  In short, Daniel Eaton. is a 76 y.o. male with a chief complaint of Urinary Frequency and Shortness of Breath .  Refer to the original H&P for additional details.  The current plan of care is to discharge the patient home. The patient's received a very thorough evaluation for multiple complaints one of them being dyspnea. Chest CT shows no evidence of a pulmonary embolism. His only complaint at this time is some low back pain mainly with movement of his left lower extremity without any clinical evidence of cauda equina syndrome. He is currently afebrile and placed a lidocaine patch on his hip and the patient's spouse is currently here went to discharge instructions. He always current medications at home and did touch base with his primary physician. We did notice that he had some minor renal insufficiency and was given a 500 mL fluid bolus. His troponin of 0.03 felt was not clinically significant based on his history and review of systems etc.  Dyspnea Musculoskeletal low back pain    Outpatient " New Prescriptions   LIDOCAINE (LIDODERM) 5 %    Place 1 patch onto the skin daily.  " Patient was advised to return immediately if condition worsens. Patient was advised to follow up with their primary care physician or other specialized physicians involved in their outpatient care. The patient and/or family member/power of attorney had laboratory results reviewed at the bedside. All questions and concerns were addressed and appropriate discharge instructions were distributed by the nursing staff.  Clinical Course       Daniel Larsen, MD 03/01/16 1028

## 2016-03-10 DIAGNOSIS — M9902 Segmental and somatic dysfunction of thoracic region: Secondary | ICD-10-CM | POA: Diagnosis not present

## 2016-03-10 DIAGNOSIS — M9904 Segmental and somatic dysfunction of sacral region: Secondary | ICD-10-CM | POA: Diagnosis not present

## 2016-03-10 DIAGNOSIS — M9903 Segmental and somatic dysfunction of lumbar region: Secondary | ICD-10-CM | POA: Diagnosis not present

## 2016-03-10 DIAGNOSIS — M545 Low back pain: Secondary | ICD-10-CM | POA: Diagnosis not present

## 2016-03-11 ENCOUNTER — Other Ambulatory Visit: Payer: Self-pay | Admitting: Physician Assistant

## 2016-03-11 ENCOUNTER — Ambulatory Visit
Admission: RE | Admit: 2016-03-11 | Discharge: 2016-03-11 | Disposition: A | Discharge status: Home / Self Care | Payer: PPO | Source: Ambulatory Visit | Attending: Physician Assistant | Admitting: Physician Assistant

## 2016-03-11 DIAGNOSIS — M7121 Synovial cyst of popliteal space [Baker], right knee: Secondary | ICD-10-CM | POA: Insufficient documentation

## 2016-03-11 DIAGNOSIS — I482 Chronic atrial fibrillation: Secondary | ICD-10-CM | POA: Diagnosis not present

## 2016-03-11 DIAGNOSIS — I129 Hypertensive chronic kidney disease with stage 1 through stage 4 chronic kidney disease, or unspecified chronic kidney disease: Secondary | ICD-10-CM | POA: Diagnosis not present

## 2016-03-11 DIAGNOSIS — M79604 Pain in right leg: Secondary | ICD-10-CM

## 2016-03-11 DIAGNOSIS — R600 Localized edema: Secondary | ICD-10-CM | POA: Diagnosis not present

## 2016-03-11 DIAGNOSIS — N183 Chronic kidney disease, stage 3 (moderate): Secondary | ICD-10-CM | POA: Diagnosis not present

## 2016-03-11 DIAGNOSIS — I429 Cardiomyopathy, unspecified: Secondary | ICD-10-CM | POA: Diagnosis not present

## 2016-03-15 DIAGNOSIS — I482 Chronic atrial fibrillation: Secondary | ICD-10-CM | POA: Diagnosis not present

## 2016-03-15 DIAGNOSIS — N183 Chronic kidney disease, stage 3 (moderate): Secondary | ICD-10-CM | POA: Diagnosis not present

## 2016-03-15 DIAGNOSIS — Z0000 Encounter for general adult medical examination without abnormal findings: Secondary | ICD-10-CM | POA: Diagnosis not present

## 2016-03-15 DIAGNOSIS — Z6841 Body Mass Index (BMI) 40.0 and over, adult: Secondary | ICD-10-CM | POA: Diagnosis not present

## 2016-03-15 DIAGNOSIS — I429 Cardiomyopathy, unspecified: Secondary | ICD-10-CM | POA: Diagnosis not present

## 2016-03-15 DIAGNOSIS — E039 Hypothyroidism, unspecified: Secondary | ICD-10-CM | POA: Diagnosis not present

## 2016-03-15 DIAGNOSIS — L03115 Cellulitis of right lower limb: Secondary | ICD-10-CM | POA: Diagnosis not present

## 2016-03-15 DIAGNOSIS — J42 Unspecified chronic bronchitis: Secondary | ICD-10-CM | POA: Diagnosis not present

## 2016-03-15 DIAGNOSIS — I129 Hypertensive chronic kidney disease with stage 1 through stage 4 chronic kidney disease, or unspecified chronic kidney disease: Secondary | ICD-10-CM | POA: Diagnosis not present

## 2016-03-15 DIAGNOSIS — G4733 Obstructive sleep apnea (adult) (pediatric): Secondary | ICD-10-CM | POA: Diagnosis not present

## 2016-03-15 DIAGNOSIS — I700 Atherosclerosis of aorta: Secondary | ICD-10-CM | POA: Insufficient documentation

## 2016-03-24 DIAGNOSIS — M9903 Segmental and somatic dysfunction of lumbar region: Secondary | ICD-10-CM | POA: Diagnosis not present

## 2016-03-24 DIAGNOSIS — M9902 Segmental and somatic dysfunction of thoracic region: Secondary | ICD-10-CM | POA: Diagnosis not present

## 2016-03-24 DIAGNOSIS — M9904 Segmental and somatic dysfunction of sacral region: Secondary | ICD-10-CM | POA: Diagnosis not present

## 2016-04-07 DIAGNOSIS — M9902 Segmental and somatic dysfunction of thoracic region: Secondary | ICD-10-CM | POA: Diagnosis not present

## 2016-04-07 DIAGNOSIS — M9904 Segmental and somatic dysfunction of sacral region: Secondary | ICD-10-CM | POA: Diagnosis not present

## 2016-04-07 DIAGNOSIS — M9903 Segmental and somatic dysfunction of lumbar region: Secondary | ICD-10-CM | POA: Diagnosis not present

## 2016-04-07 DIAGNOSIS — M9907 Segmental and somatic dysfunction of upper extremity: Secondary | ICD-10-CM | POA: Diagnosis not present

## 2016-04-19 ENCOUNTER — Ambulatory Visit: Payer: PPO | Attending: Internal Medicine

## 2016-04-19 VITALS — BP 125/75 | HR 68

## 2016-04-19 DIAGNOSIS — R269 Unspecified abnormalities of gait and mobility: Secondary | ICD-10-CM | POA: Diagnosis not present

## 2016-04-19 DIAGNOSIS — R262 Difficulty in walking, not elsewhere classified: Secondary | ICD-10-CM | POA: Diagnosis not present

## 2016-04-19 DIAGNOSIS — M6281 Muscle weakness (generalized): Secondary | ICD-10-CM

## 2016-04-19 NOTE — Therapy (Signed)
[place] MAIN [facility] SERVICES [address] [place], [place], [address] Phone: [phone]   Fax:  [phone]  Physical Therapy Evaluation  Patient Details  Name: Daniel Eaton. MRN: [id number] Date of Birth: [birth year] Referring Provider: Dr. Ouida Sills  Encounter Date: 04/19/2016      PT End of Session - 04/19/16 1032    Visit Number 1   Number of Visits 17   Date for PT Re-Evaluation 2016-07-03   Authorization Type g codes 1/10   PT Start Time 1015   PT Stop Time 1105   PT Time Calculation (min) 50 min   Equipment Utilized During Treatment Gait belt   Activity Tolerance Patient tolerated treatment well   Behavior During Therapy WFL for tasks assessed/performed      Past Medical History:  Diagnosis Date  . A-fib ([place])   . CHF (congestive heart failure) ([place])   . COPD (chronic obstructive pulmonary disease) ([place])   . Difficult intubation   . Hyperlipidemia   . Hypertension   . Hypothyroidism   . Kidney disease   . Sleep apnea   . Stroke ([place])   . Thyroid disease     Past Surgical History:  Procedure Laterality Date  . CARDIAC CATHETERIZATION    . CATARACT EXTRACTION W/ INTRAOCULAR LENS  IMPLANT, BILATERAL    . COLONOSCOPY  2008  . HERNIA REPAIR     bilateral inguinal hernia/ [place]  . HERNIA REPAIR  02/02/2015   18 x 28 cm ventral light mesh placed laparoscopically.  . TONSILLECTOMY    . UMBILICAL HERNIA REPAIR N/A 02/02/2015   Procedure: HERNIA REPAIR UMBILICAL ADULT;  Surgeon: Robert Bellow, MD;  Location: [facility] ORS;  Service: General;  Laterality: N/A;  . VENTRAL HERNIA REPAIR N/A 02/02/2015   Procedure: LAPAROSCOPIC VENTRAL HERNIA;  Surgeon: Robert Bellow, MD;  Location: [facility] ORS;  Service: General;  Laterality: N/A;  . vp shunt placement  1979    Vitals:   04/19/16 1031  BP: 125/75  Pulse: 68  SpO2: 98%         Subjective Assessment - 04/19/16 1025    Subjective Balance deficits   Pertinent  History Pt reports a history of rapid onset balance difficulty since 2014. He reports that the initial episode occurred due to improper use of diuretic with hypokalemia. One year later he had another episode with a fall. He was sent to ENT and had a VNG study which was normal. ENT also ordered a head CT and MRI. MRI showed chronic R MCA infarct. Pt comes for [employer] 2-3x/wk. He has completed pulmonary rehab at [facility] twice. Pt denies dizziness or vertigo but reports difficulty with his balance    Limitations Walking   Patient Stated Goals Improve balance so that he only needs cane or no assistive device   Currently in Pain? No/denies  Not currently related to balance. Chronic bilateral hip and R shoulder pain            [facility] PT Assessment - 04/19/16 1039      Assessment   Medical Diagnosis Vertigo   Referring Provider Dr. Ouida Sills   Onset Date/Surgical Date 02/14/13  Approximate   Hand Dominance Right   Prior Therapy Yes     Precautions   Precautions Fall     Restrictions   Weight Bearing Restrictions No     Balance Screen   Has the patient fallen in the past 6 months No   Has the  patient had a decrease in activity level because of a fear of falling?  Yes   Is the patient reluctant to leave their home because of a fear of falling?  No     Home Ecologist residence   Living Arrangements Spouse/significant other   Available Help at Discharge Family   Type of [place] to enter   Entrance Stairs-Number of Steps 4   Entrance Stairs-Rails Right;Left;Can reach both   [place] One level   [place] - 2 wheels;Cane - single point;Grab bars - tub/shower  Hand held urinal     Prior Function   Level of Independence Independent     Cognition   Overall Cognitive Status Within Functional Limits for tasks assessed     Observation/Other Assessments   Other Surveys  Other Surveys   Activities of Balance  Confidence Scale (ABC Scale)  23.125%     ROM / Strength   AROM / PROM / Strength Strength     Strength   Overall Strength Comments Decreased R shoulder flexion strength which is chronic. Bilateral hip flexion weakness. Decreased LE strength noted functionally with heavy UE reliance during sit to stand transfers. MMT reveals no asymmetrical weakness. No foot drop noted. Pt with limited ability to change positions so MMT is limited     Ambulation/Gait   Gait Comments Pt ambulates with wide BOS and rolling walker      Standardized Balance Assessment   Standardized Balance Assessment Berg Balance Test;Timed Up and Go Test;Five Times Sit to Stand;10 meter walk test   10 Meter Walk 13.3 seconds = 0.75 m/s  with walker     Berg Balance Test   Sit to Stand Able to stand using hands after several tries   Standing Unsupported Able to stand safely 2 minutes   Sitting with Back Unsupported but Feet Supported on Floor or Stool Able to sit safely and securely 2 minutes   Stand to Sit Controls descent by using hands   Transfers Able to transfer safely, definite need of hands   Standing Unsupported with Eyes Closed Able to stand 10 seconds with supervision   Standing Ubsupported with Feet Together Able to place feet together independently and stand 1 minute safely   From Standing, Reach Forward with Outstretched Arm Can reach confidently >25 cm (10")   From Standing Position, Pick up Object from Floor Able to pick up shoe, needs supervision   From Standing Position, Turn to Look Behind Over each Shoulder Turn sideways only but maintains balance   Turn 360 Degrees Able to turn 360 degrees safely but slowly   Standing Unsupported, Alternately Place Feet on Step/Stool Able to complete >2 steps/needs minimal assist   Standing Unsupported, One Foot in Front Able to take small step independently and hold 30 seconds   Standing on One Leg Tries to lift leg/unable to hold 3 seconds but remains standing  independently   Total Score 38     Timed Up and Go Test   TUG Normal TUG   Normal TUG (seconds) 36.15  Primary difficutly with standing and sitting, with walker        TREATMENT  NEUROMUSCULAR RE-EDUCATION Pt issued written HEP which includes standing single leg balance training and sit to stands from elevated surface. Extensive education regarding exercise performance with patient. Practiced single leg balance in // bars with faded UE reliance.  PT Education - 04/19/16 1031    Education provided Yes   Education Details Plan of care and HEP   Person(s) Educated Patient   Methods Explanation   Comprehension Verbalized understanding             PT Long Term Goals - 04/20/16 0826      PT LONG TERM GOAL #1   Title Pt will be independent with HEP in order to improve strength and balance in order to decrease fall risk and improve function at home and work.   Time 8   Period Weeks   Status New     PT LONG TERM GOAL #2   Title Pt will improve BERG by at least 3 points in order to demonstrate clinically significant improvement in balance.    Baseline 04/19/16: 38/56   Time 8   Period Weeks   Status New     PT LONG TERM GOAL #3   Title Pt will decrease TUG to below 25 seconds in order to demonstrate decreased fall risk and improved LE strength      Baseline 04/19/16: 36.15 seconds   Time 8   Period Weeks   Status New     PT LONG TERM GOAL #4   Title Pt will increase 10MWT by at least 0.13 m/s in order to demonstrate clinically significant improvement in community ambulation.    Baseline 04/19/16: 0.75 m/s   Time 8   Period Days   Status New               Plan - 04/19/16 1032    Clinical Impression Statement Pt is a pleasant 76 yo male referred with a diagnosis of vertigo. However after speaking with patient he denies any history of vertigo or dizziness. His current complaint is related to difficulty with his balance and lower  extremity weakness. Pt would like to be able to progress to ambulating with single point cane or no assistive device instead of his walker. PT examination reveals grossly weak LE with heavy UE reliance for sit to stand transfers. Significantly impaired balance with BERG of 38/56. Decreased LE power noted with TUG of 36.15 with primary difficulty with performing sit to stand phase of test. Gait speed is also below age/gender normals at 0.75 m/s with rolling walker. Pt will benefit from skilled PT services to address deficits in balance and strength in order to improve function and decrease fall risk.     Rehab Potential Good   Clinical Impairments Affecting Rehab Potential Positive: motivation; Negative: chronic balance dysfunction, chronic CVA   PT Frequency 2x / week   PT Duration 8 weeks   PT Treatment/Interventions ADLs/Self Care Home Management;Cryotherapy;Moist Heat;Neuromuscular re-education;Gait training;Functional mobility training;Stair training;Therapeutic activities;Therapeutic exercise;Balance training;Manual techniques;Aquatic Therapy;Canalith Repostioning;Electrical Stimulation;Traction;Ultrasound;DME Instruction;Patient/family education;Passive range of motion;Energy conservation;Vestibular   PT Next Visit Plan Progress HEP, continue LE strengthening and balance training   PT Home Exercise Plan Single leg balance practice and sit to stand from elevated surface without UE support   Consulted and Agree with Plan of Care Patient      Patient will benefit from skilled therapeutic intervention in order to improve the following deficits and impairments:  Abnormal gait, Decreased activity tolerance, Decreased balance, Decreased strength, Difficulty walking, Obesity, Decreased coordination  Visit Diagnosis: Difficulty in walking, not elsewhere classified - Plan: PT plan of care cert/re-cert  Muscle weakness (generalized) - Plan: PT plan of care cert/re-cert      G-Codes - [id number]  Functional Assessment Tool Used (Outpatient Only) Berg balance test, TUG, 68m gait speed, clinical judgement   Functional Limitation Mobility: Walking and moving around   Mobility: Walking and Moving Around Current Status [phone]) At least 40 percent but less than 60 percent impaired, limited or restricted   Mobility: Walking and Moving Around Goal Status [phone]) At least 20 percent but less than 40 percent impaired, limited or restricted       Problem List Patient Active Problem List   Diagnosis Date Noted  . Cough 03/20/2015  . Bronchitis, chronic obstructive w acute bronchitis ([place]) 03/20/2015  . Umbilical hernia without obstruction and without gangrene 01/12/2015  . Ventral hernia without obstruction or gangrene 01/02/2015   Phillips Grout PT, DPT   Tiandra Swoveland 04/20/2016, 8:35 AM  [place] MAIN [facility] SERVICES [address] [place], [place], [address] Phone: [phone]   Fax:  [phone]  Name: Daniel Eaton. MRN: [id number] Date of Birth: [birth year]

## 2016-04-19 NOTE — Patient Instructions (Signed)
Single Leg - Eyes Open    Holding support, lift right leg while maintaining balance over other leg. Progress to removing hands from support surface for longer periods of time. Hold_30__ seconds total. Repeat _3___ times per session on each leg. Do _2___ sessions per day.  SIT TO STAND: Feet Apart    Place feet apart. Lean chest forward. Raise hips and straighten knees to stand. _10__ reps per set, 2 sets, _2__ times/day.

## 2016-04-21 ENCOUNTER — Ambulatory Visit: Payer: PPO | Admitting: Physical Therapy

## 2016-04-21 ENCOUNTER — Encounter: Payer: Self-pay | Admitting: Physical Therapy

## 2016-04-21 DIAGNOSIS — R262 Difficulty in walking, not elsewhere classified: Secondary | ICD-10-CM

## 2016-04-21 DIAGNOSIS — M6281 Muscle weakness (generalized): Secondary | ICD-10-CM

## 2016-04-21 NOTE — Patient Instructions (Addendum)
  ABDUCTION: Sitting - Exercise Ball: Resistance Band (Active)   Sit with feet flat. With band tied around both legs, Lift right leg slightly and, against resistance band, draw it out to side. Complete __2_ sets of __10_ repetitions. Perform _2__ sessions per day.  Copyright  VHI. All rights reserved.  FLEXION: Sitting - Resistance Band (Active)   Sit, both feet flat. Have band tied around both legs above knees, lift right knee toward ceiling.Repeat with other knee Complete _2__ sets of _10__ repetitions. Perform _2__ sessions per day.  http://gtsc.exer.us/21   Knee Extension: Resisted (Sitting)   With band looped around right ankle and under other foot, straighten leg with ankle loop. Keep other leg bent to increase resistance. Repeat _10___ times per set. Do __2__ sets per session. Do _2___ sessions per day.  http://orth.exer.us/691   Copyright  VHI. All rights reserved.  FLEXION: Sitting - Resistance Band (Active)   Sit with right foot flat. Have band tied around both feet, bend ankle, bringing toes toward head. Complete __2_ sets of __10_ repetitions. Perform _2__ sessions per day.  Backward Walking   Walk backward, toes of each foot coming down first. Take long, even strides. Make sure you have a clear pathway with no obstructions when you do this. Stand beside counter and walk backward  And then walk forward doing opposite directions; repeat 10 laps 2x a day at least 5 days a week.  AMBULATION: Side Step    Step sideways. Repeat in opposite direction. Have ankle weights on if possible;  _10__ steps each side__3_ sets per day, __5_ days per week Use assistive device. Walk to tempo of metronome or music.  Copyright  VHI. All rights reserved.   Copyright  VHI. All rights reserved.  Tandem Walking   Stand beside kitchen sink and place one foot in front of the other, lift your hand and try to hold position for 10 sec. Repeat with other foot in front; Repeat 5  reps with each foot in front 5 days a week.Balance: Unilateral   Attempt to balance on left leg, eyes open. Hold _10___ seconds.HOLD ONTO COUNTER FOR BALANCE Repeat __5__ times per set. Do __1__ sets per session. Do __1__ sessions per day. Keep eyes open:   http://orth.exer.us/29   Copyright  VHI. All rights reserved.

## 2016-04-21 NOTE — Therapy (Signed)
[place] MAIN [facility] SERVICES [address] [place], [place], [address] Phone: [phone]   Fax:  [phone]  Physical Therapy Treatment  Patient Details  Name: Daniel Eaton. MRN: [id number] Date of Birth: 1940/10/08 Referring Provider: Dr. Ouida Sills  Encounter Date: 04/21/2016      PT End of Session - 04/21/16 1447    Visit Number 2   Number of Visits 17   Date for PT Re-Evaluation 27-Jun-2016   Authorization Type g codes 04/08/2022   PT Start Time 8563   PT Stop Time 1515   PT Time Calculation (min) 43 min   Equipment Utilized During Treatment Gait belt   Activity Tolerance Patient tolerated treatment well   Behavior During Therapy WFL for tasks assessed/performed      Past Medical History:  Diagnosis Date  . A-fib ([place])   . CHF (congestive heart failure) ([place])   . COPD (chronic obstructive pulmonary disease) ([place])   . Difficult intubation   . Hyperlipidemia   . Hypertension   . Hypothyroidism   . Kidney disease   . Sleep apnea   . Stroke ([place])   . Thyroid disease     Past Surgical History:  Procedure Laterality Date  . CARDIAC CATHETERIZATION    . CATARACT EXTRACTION W/ INTRAOCULAR LENS  IMPLANT, BILATERAL    . COLONOSCOPY  2008  . HERNIA REPAIR     bilateral inguinal hernia/ [place]  . HERNIA REPAIR  02/02/2015   18 x 28 cm ventral light mesh placed laparoscopically.  . TONSILLECTOMY    . UMBILICAL HERNIA REPAIR N/A 02/02/2015   Procedure: HERNIA REPAIR UMBILICAL ADULT;  Surgeon: Robert Bellow, MD;  Location: [facility] ORS;  Service: General;  Laterality: N/A;  . VENTRAL HERNIA REPAIR N/A 02/02/2015   Procedure: LAPAROSCOPIC VENTRAL HERNIA;  Surgeon: Robert Bellow, MD;  Location: [facility] ORS;  Service: General;  Laterality: N/A;  . vp shunt placement  1979    There were no vitals filed for this visit.      Subjective Assessment - 04/21/16 1442    Subjective Patient reports doing okay; He denies any pain today; He  reports compliance on exercise at home; He reports getting a good workout yesterday in [employer].    Pertinent History Pt reports a history of rapid onset balance difficulty since 2014. He reports that the initial episode occurred due to improper use of diuretic with hypokalemia. One year later he had another episode with a fall. He was sent to ENT and had a VNG study which was normal. ENT also ordered a head CT and MRI. MRI showed chronic R MCA infarct. Pt comes for [employer] 2-3x/wk. He has completed pulmonary rehab at [facility] twice. Pt denies dizziness or vertigo but reports difficulty with his balance    Limitations Walking   Patient Stated Goals Improve balance so that he only needs cane or no assistive device   Currently in Pain? No/denies     TREATMENT: Seated with green tband around both legs: Hip flexion march x10 bilaterally; Hip abduction x10 bilaterally; LAQ x10 bilaterally; Ankle DF x10 bilaterally;  Patient required min-moderate verbal/tactile cues for correct exercise technique including cues for how to tie band and to improve positioning and ROM for better strengthening;    Gait with 3# ankle weights x80 feet; using RW, supervision with min Vcs to increase step length and improve ankle DF at heel strike for better gait mechanics; Instructed patient to start walking with  ankle weights at home to increase endurance and gait strength;   Forward/ backward walking 3# x10 feet x3 laps; Side stepping 3# x10 feet x3 laps each direction; Patient able to progress to less rail assist with dynamic steps; He required min Vcs for step length and positioning to isolate hip muscles and improve dynamic balance;   SLS with 1 rail assist 10 sec hold x2 bilaterally; Instructed patient to hold onto sink and work on standing on single leg for at least 10 sec to improve strength and tolerance with standing on one leg;  Tandem stance modified unsupported 10 sec hold x2 each foot in front;  demonstrates good stance control with minimal lateral loss of balance/unsteadiness;   Pt reports increased right knee discomfort; reports pain with inferior glide to right patella consistent with possible OA in right knee; Reports minimal fatigue at end of session;                           PT Education - 04/21/16 1444    Education provided Yes   Education Details HEP advanced, strengthening/balance exercise;    Person(s) Educated Patient   Methods Explanation;Verbal cues;Demonstration;Handout   Comprehension Verbalized understanding;Returned demonstration;Verbal cues required             PT Long Term Goals - 04/20/16 0826      PT LONG TERM GOAL #1   Title Pt will be independent with HEP in order to improve strength and balance in order to decrease fall risk and improve function at home and work.   Time 8   Period Weeks   Status New     PT LONG TERM GOAL #2   Title Pt will improve BERG by at least 3 points in order to demonstrate clinically significant improvement in balance.    Baseline 04/19/16: 38/56   Time 8   Period Weeks   Status New     PT LONG TERM GOAL #3   Title Pt will decrease TUG to below 25 seconds in order to demonstrate decreased fall risk and improved LE strength      Baseline 04/19/16: 36.15 seconds   Time 8   Period Weeks   Status New     PT LONG TERM GOAL #4   Title Pt will increase 10MWT by at least 0.13 m/s in order to demonstrate clinically significant improvement in community ambulation.    Baseline 04/19/16: 0.75 m/s   Time 8   Period Days   Status New               Plan - 04/21/16 1641    Clinical Impression Statement Patient instructed in sitting strengthening exercise as part of HEP; also instructed patient in advanced balance exercise. Patient requires rail assist for SLS but was able to reduce HHA with modified tandem stance and dynamic tasks. Patient reports slight increase in right knee pain along patella with  prolonged standing. He exhibits a positive inferior glide test on right patella consistent with possible OA. Patient would benefit from additional skilled PT intervention to improve balance, gait safety and LE strength;    Rehab Potential Good   Clinical Impairments Affecting Rehab Potential Positive: motivation; Negative: chronic balance dysfunction, chronic CVA   PT Frequency 2x / week   PT Duration 8 weeks   PT Treatment/Interventions ADLs/Self Care Home Management;Cryotherapy;Moist Heat;Neuromuscular re-education;Gait training;Functional mobility training;Stair training;Therapeutic activities;Therapeutic exercise;Balance training;Manual techniques;Aquatic Therapy;Canalith Repostioning;Electrical Stimulation;Traction;Ultrasound;DME Instruction;Patient/family education;Passive range of motion;Energy conservation;Vestibular  PT Next Visit Plan Progress HEP, continue LE strengthening and balance training   PT Home Exercise Plan advanced- see patient instructions;    Consulted and Agree with Plan of Care Patient      Patient will benefit from skilled therapeutic intervention in order to improve the following deficits and impairments:  Abnormal gait, Decreased activity tolerance, Decreased balance, Decreased strength, Difficulty walking, Obesity, Decreased coordination  Visit Diagnosis: Muscle weakness (generalized)  Difficulty in walking, not elsewhere classified     Problem List Patient Active Problem List   Diagnosis Date Noted  . Cough 03/20/2015  . Bronchitis, chronic obstructive w acute bronchitis ([place]) 03/20/2015  . Umbilical hernia without obstruction and without gangrene 01/12/2015  . Ventral hernia without obstruction or gangrene 01/02/2015    Rhealynn Myhre PT, DPT 04/21/2016, 4:44 PM  [place] MAIN [facility] SERVICES [address] [place], [place], [address] Phone: [phone]   Fax:  [phone]  Name: Daniel Eaton. MRN:  [id number] Date of Birth: 1941/02/14

## 2016-04-25 ENCOUNTER — Ambulatory Visit: Payer: PPO | Admitting: Physical Therapy

## 2016-04-27 ENCOUNTER — Ambulatory Visit: Payer: PPO

## 2016-04-27 DIAGNOSIS — R262 Difficulty in walking, not elsewhere classified: Secondary | ICD-10-CM | POA: Diagnosis not present

## 2016-04-27 DIAGNOSIS — M6281 Muscle weakness (generalized): Secondary | ICD-10-CM

## 2016-04-27 NOTE — Therapy (Signed)
[place] MAIN [facility] SERVICES [address] [place], [place], [address] Phone: [phone]   Fax:  [phone]  Physical Therapy Treatment  Patient Details  Name: Daniel Eaton. MRN: [id number] Date of Birth: 11-22-1940 Referring Provider: Dr. Ouida Sills  Encounter Date: 04/27/2016      PT End of Session - 04/27/16 1518    Visit Number 3   Number of Visits 17   Date for PT Re-Evaluation Jul 08, 2016   Authorization Type g codes 2022-05-18   PT Start Time 1430   PT Stop Time 1515   PT Time Calculation (min) 45 min   Equipment Utilized During Treatment Gait belt   Activity Tolerance Patient tolerated treatment well   Behavior During Therapy WFL for tasks assessed/performed      Past Medical History:  Diagnosis Date  . A-fib ([place])   . CHF (congestive heart failure) ([place])   . COPD (chronic obstructive pulmonary disease) ([place])   . Difficult intubation   . Hyperlipidemia   . Hypertension   . Hypothyroidism   . Kidney disease   . Sleep apnea   . Stroke ([place])   . Thyroid disease     Past Surgical History:  Procedure Laterality Date  . CARDIAC CATHETERIZATION    . CATARACT EXTRACTION W/ INTRAOCULAR LENS  IMPLANT, BILATERAL    . COLONOSCOPY  2008  . HERNIA REPAIR     bilateral inguinal hernia/ [place]  . HERNIA REPAIR  02/02/2015   18 x 28 cm ventral light mesh placed laparoscopically.  . TONSILLECTOMY    . UMBILICAL HERNIA REPAIR N/A 02/02/2015   Procedure: HERNIA REPAIR UMBILICAL ADULT;  Surgeon: Robert Bellow, MD;  Location: [facility] ORS;  Service: General;  Laterality: N/A;  . VENTRAL HERNIA REPAIR N/A 02/02/2015   Procedure: LAPAROSCOPIC VENTRAL HERNIA;  Surgeon: Robert Bellow, MD;  Location: [facility] ORS;  Service: General;  Laterality: N/A;  . vp shunt placement  1979    There were no vitals filed for this visit.      Subjective Assessment - 04/27/16 1517    Subjective Patient reports he's been performing sitting exercises at  homoe secondary to increased knee pain when performing standing exercises. Patient reports increased LE and UE soreness since the previous treatment. Patient reports most soreness in his knees/back.   Pertinent History Pt reports a history of rapid onset balance difficulty since 2014. He reports that the initial episode occurred due to improper use of diuretic with hypokalemia. One year later he had another episode with a fall. He was sent to ENT and had a VNG study which was normal. ENT also ordered a head CT and MRI. MRI showed chronic R MCA infarct. Pt comes for [employer] 2-3x/wk. He has completed pulmonary rehab at [facility] twice. Pt denies dizziness or vertigo but reports difficulty with his balance    Limitations Walking   Patient Stated Goals Improve balance so that he only needs cane or no assistive device   Currently in Pain? No/denies        TREATMENT: Hip flexion march x20 bilaterally with GTB Hip abduction x20 bilaterally with GTB around knees Hip adduction with glute squeeze 4# ball - x 20  Feet apart balance on half foam - 5 x 30sec Heel raises in standing x 10 with UE support Ambulation with head turns up/down; right/left - 2 x 57ft  Sit to stands - x 10 from raised mat table Toe raises in standing - x10 Forward step  with heel to toe rocking over half foam roller - x 15 B        PT Education - 04/27/16 1518    Education provided Yes   Education Details HEP: Performance of sitting exercises with a band   Person(s) Educated Patient   Methods Explanation;Demonstration   Comprehension Verbalized understanding;Returned demonstration             PT Long Term Goals - 04/20/16 0826      PT LONG TERM GOAL #1   Title Pt will be independent with HEP in order to improve strength and balance in order to decrease fall risk and improve function at home and work.   Time 8   Period Weeks   Status New     PT LONG TERM GOAL #2   Title Pt will improve BERG by at least 3 points  in order to demonstrate clinically significant improvement in balance.    Baseline 04/19/16: 38/56   Time 8   Period Weeks   Status New     PT LONG TERM GOAL #3   Title Pt will decrease TUG to below 25 seconds in order to demonstrate decreased fall risk and improved LE strength      Baseline 04/19/16: 36.15 seconds   Time 8   Period Weeks   Status New     PT LONG TERM GOAL #4   Title Pt will increase 10MWT by at least 0.13 m/s in order to demonstrate clinically significant improvement in community ambulation.    Baseline 04/19/16: 0.75 m/s   Time 8   Period Days   Status New               Plan - 04/27/16 1554    Clinical Impression Statement Focused on advancement of sitting exercises and performing more exercises in standing today to improve LE strength and improving endurance. Patient demonstrates increased postural sway and LOB posteriorly requiring UEs to maintain balance and VCs to shift weight forward. Patient will benefit from further skilled therapy focused on improving strength and balance to return to prior level of function.    Rehab Potential Good   Clinical Impairments Affecting Rehab Potential Positive: motivation; Negative: chronic balance dysfunction, chronic CVA   PT Frequency 2x / week   PT Duration 8 weeks   PT Treatment/Interventions ADLs/Self Care Home Management;Cryotherapy;Moist Heat;Neuromuscular re-education;Gait training;Functional mobility training;Stair training;Therapeutic activities;Therapeutic exercise;Balance training;Manual techniques;Aquatic Therapy;Canalith Repostioning;Electrical Stimulation;Traction;Ultrasound;DME Instruction;Patient/family education;Passive range of motion;Energy conservation;Vestibular   PT Next Visit Plan Progress HEP, continue LE strengthening and balance training   PT Home Exercise Plan advanced- see patient instructions;    Consulted and Agree with Plan of Care Patient      Patient will benefit from skilled therapeutic  intervention in order to improve the following deficits and impairments:  Abnormal gait, Decreased activity tolerance, Decreased balance, Decreased strength, Difficulty walking, Obesity, Decreased coordination  Visit Diagnosis: Muscle weakness (generalized)  Difficulty in walking, not elsewhere classified     Problem List Patient Active Problem List   Diagnosis Date Noted  . Cough 03/20/2015  . Bronchitis, chronic obstructive w acute bronchitis ([place]) 03/20/2015  . Umbilical hernia without obstruction and without gangrene 01/12/2015  . Ventral hernia without obstruction or gangrene 01/02/2015    Blythe Stanford, PT DPT 04/27/2016, 3:57 PM  [place] MAIN [facility] SERVICES [address] [place], [place], [address] Phone: [phone]   Fax:  [phone]  Name: Tina Temme. MRN: [id number] Date of  Birth: Aug 03, 1940

## 2016-04-28 DIAGNOSIS — M9903 Segmental and somatic dysfunction of lumbar region: Secondary | ICD-10-CM | POA: Diagnosis not present

## 2016-04-28 DIAGNOSIS — M9902 Segmental and somatic dysfunction of thoracic region: Secondary | ICD-10-CM | POA: Diagnosis not present

## 2016-04-28 DIAGNOSIS — M9907 Segmental and somatic dysfunction of upper extremity: Secondary | ICD-10-CM | POA: Diagnosis not present

## 2016-04-28 DIAGNOSIS — M9904 Segmental and somatic dysfunction of sacral region: Secondary | ICD-10-CM | POA: Diagnosis not present

## 2016-04-29 ENCOUNTER — Encounter: Payer: PPO | Admitting: Physical Therapy

## 2016-05-03 ENCOUNTER — Encounter: Payer: PPO | Admitting: Physical Therapy

## 2016-05-03 ENCOUNTER — Ambulatory Visit: Payer: PPO

## 2016-05-03 DIAGNOSIS — R262 Difficulty in walking, not elsewhere classified: Secondary | ICD-10-CM

## 2016-05-03 DIAGNOSIS — R269 Unspecified abnormalities of gait and mobility: Secondary | ICD-10-CM

## 2016-05-03 DIAGNOSIS — M6281 Muscle weakness (generalized): Secondary | ICD-10-CM

## 2016-05-03 NOTE — Therapy (Signed)
[place] MAIN [facility] SERVICES [address] [place], [place], [address] Phone: [phone]   Fax:  [phone]  Physical Therapy Treatment  Patient Details  Name: Daniel Eaton. MRN: [id number] Date of Birth: [birth year] Referring Provider: Dr. Ouida Sills  Encounter Date: 05/03/2016      PT End of Session - 05/03/16 1316    Visit Number 4   Number of Visits 17   Date for PT Re-Evaluation Jun 27, 2016   Authorization Type g codes 07-Jun-2022   PT Start Time 1302   PT Stop Time 1345   PT Time Calculation (min) 43 min   Equipment Utilized During Treatment Gait belt   Activity Tolerance Patient tolerated treatment well   Behavior During Therapy WFL for tasks assessed/performed      Past Medical History:  Diagnosis Date  . A-fib ([place])   . CHF (congestive heart failure) ([place])   . COPD (chronic obstructive pulmonary disease) ([place])   . Difficult intubation   . Hyperlipidemia   . Hypertension   . Hypothyroidism   . Kidney disease   . Sleep apnea   . Stroke ([place])   . Thyroid disease     Past Surgical History:  Procedure Laterality Date  . CARDIAC CATHETERIZATION    . CATARACT EXTRACTION W/ INTRAOCULAR LENS  IMPLANT, BILATERAL    . COLONOSCOPY  2008  . HERNIA REPAIR     bilateral inguinal hernia/ [place]  . HERNIA REPAIR  02/02/2015   18 x 28 cm ventral light mesh placed laparoscopically.  . TONSILLECTOMY    . UMBILICAL HERNIA REPAIR N/A 02/02/2015   Procedure: HERNIA REPAIR UMBILICAL ADULT;  Surgeon: Robert Bellow, MD;  Location: [facility] ORS;  Service: General;  Laterality: N/A;  . VENTRAL HERNIA REPAIR N/A 02/02/2015   Procedure: LAPAROSCOPIC VENTRAL HERNIA;  Surgeon: Robert Bellow, MD;  Location: [facility] ORS;  Service: General;  Laterality: N/A;  . vp shunt placement  1979    There were no vitals filed for this visit.      Subjective Assessment - 05/03/16 1311    Subjective Patient states he felt sore after the previous session  but reports he's feeling better today.    Pertinent History Pt reports a history of rapid onset balance difficulty since 2014. He reports that the initial episode occurred due to improper use of diuretic with hypokalemia. One year later he had another episode with a fall. He was sent to ENT and had a VNG study which was normal. ENT also ordered a head CT and MRI. MRI showed chronic R MCA infarct. Pt comes for [employer] 2-3x/wk. He has completed pulmonary rehab at [facility] twice. Pt denies dizziness or vertigo but reports difficulty with his balance    Limitations Walking   Patient Stated Goals Improve balance so that he only needs cane or no assistive device   Currently in Pain? Yes   Pain Score 3    Pain Location --  legs/knee   Pain Orientation Right;Left   Pain Descriptors / Indicators Sore   Pain Type Acute pain   Pain Onset 1 to 4 weeks ago   Pain Frequency Constant      TREATMENT: Sit to stands - x 10 from raised mat table Hip abduction x20 bilaterally with GTB around knees Hip flexion march x20 bilaterally with GTB Hip adduction with glute squeeze 4# ball - x 20  Seated  knee flexion with GTB - x 15 B  Feet apart balance on  half foam - 2 x 30sec Feet apart heel/toe raises on half foam - x13 Side stepping up and over airex pad - x8 Standing marches on airex with UE support - 2x20 Ambulation forward/backward over 2 half foams - x10 in  parallel bars        PT Education - 05/03/16 1315    Education provided Yes   Education Details form/tehcnique with exercise   Person(s) Educated Patient   Methods Explanation;Demonstration   Comprehension Verbalized understanding;Returned demonstration             PT Long Term Goals - 04/20/16 0826      PT LONG TERM GOAL #1   Title Pt will be independent with HEP in order to improve strength and balance in order to decrease fall risk and improve function at home and work.   Time 8   Period Weeks   Status New     PT LONG TERM  GOAL #2   Title Pt will improve BERG by at least 3 points in order to demonstrate clinically significant improvement in balance.    Baseline 04/19/16: 38/56   Time 8   Period Weeks   Status New     PT LONG TERM GOAL #3   Title Pt will decrease TUG to below 25 seconds in order to demonstrate decreased fall risk and improved LE strength      Baseline 04/19/16: 36.15 seconds   Time 8   Period Weeks   Status New     PT LONG TERM GOAL #4   Title Pt will increase 10MWT by at least 0.13 m/s in order to demonstrate clinically significant improvement in community ambulation.    Baseline 04/19/16: 0.75 m/s   Time 8   Period Days   Status New               Plan - 05/03/16 1318    Clinical Impression Statement Did not progress exercises today secondary to increased soreness from the previous visit. Patient demonstrates increased fatigue after performing standing exercise indicating decreased muscular endurance/strength and patient will benefit from further skilled therapy to return to prior level of function.    Rehab Potential Good   Clinical Impairments Affecting Rehab Potential Positive: motivation; Negative: chronic balance dysfunction, chronic CVA   PT Frequency 2x / week   PT Duration 8 weeks   PT Treatment/Interventions ADLs/Self Care Home Management;Cryotherapy;Moist Heat;Neuromuscular re-education;Gait training;Functional mobility training;Stair training;Therapeutic activities;Therapeutic exercise;Balance training;Manual techniques;Aquatic Therapy;Canalith Repostioning;Electrical Stimulation;Traction;Ultrasound;DME Instruction;Patient/family education;Passive range of motion;Energy conservation;Vestibular   PT Next Visit Plan Progress HEP, continue LE strengthening and balance training   PT Home Exercise Plan advanced- see patient instructions;    Consulted and Agree with Plan of Care Patient      Patient will benefit from skilled therapeutic intervention in order to improve the  following deficits and impairments:  Abnormal gait, Decreased activity tolerance, Decreased balance, Decreased strength, Difficulty walking, Obesity, Decreased coordination  Visit Diagnosis: Muscle weakness (generalized)  Difficulty in walking, not elsewhere classified  Gait difficulty     Problem List Patient Active Problem List   Diagnosis Date Noted  . Cough 03/20/2015  . Bronchitis, chronic obstructive w acute bronchitis ([place]) 03/20/2015  . Umbilical hernia without obstruction and without gangrene 01/12/2015  . Ventral hernia without obstruction or gangrene 01/02/2015    Blythe Stanford, PT DPT 05/03/2016, 1:45 PM  [place] MAIN [facility] SERVICES [address] [place], [place], [address] Phone: [phone]   Fax:  [phone]  Name: Daniel Sena. MRN: [id number] Date of Birth: 1941/01/14

## 2016-05-05 ENCOUNTER — Ambulatory Visit: Payer: PPO | Admitting: Physical Therapy

## 2016-05-05 ENCOUNTER — Encounter: Payer: Self-pay | Admitting: Physical Therapy

## 2016-05-05 DIAGNOSIS — R262 Difficulty in walking, not elsewhere classified: Secondary | ICD-10-CM

## 2016-05-05 DIAGNOSIS — M6281 Muscle weakness (generalized): Secondary | ICD-10-CM

## 2016-05-05 DIAGNOSIS — R269 Unspecified abnormalities of gait and mobility: Secondary | ICD-10-CM

## 2016-05-05 NOTE — Therapy (Signed)
[place] MAIN [facility] SERVICES [address] [place], [place], [address] Phone: [phone]   Fax:  [phone]  Physical Therapy Treatment  Patient Details  Name: Daniel Eaton. MRN: [id number] Date of Birth: 1940/06/01 Referring Provider: Dr. Ouida Sills  Encounter Date: 05/05/2016      PT End of Session - 05/05/16 1226-06-05    Visit Number 5   Number of Visits 17   Date for PT Re-Evaluation 07/06/16   Authorization Type g codes 5/10   PT Start Time 06/04/1213   PT Stop Time 1300   PT Time Calculation (min) 45 min   Equipment Utilized During Treatment Gait belt   Activity Tolerance Patient tolerated treatment well   Behavior During Therapy WFL for tasks assessed/performed      Past Medical History:  Diagnosis Date  . A-fib ([place])   . CHF (congestive heart failure) ([place])   . COPD (chronic obstructive pulmonary disease) ([place])   . Difficult intubation   . Hyperlipidemia   . Hypertension   . Hypothyroidism   . Kidney disease   . Sleep apnea   . Stroke ([place])   . Thyroid disease     Past Surgical History:  Procedure Laterality Date  . CARDIAC CATHETERIZATION    . CATARACT EXTRACTION W/ INTRAOCULAR LENS  IMPLANT, BILATERAL    . COLONOSCOPY  06/05/2006  . HERNIA REPAIR     bilateral inguinal hernia/ [place]  . HERNIA REPAIR  02/02/2015   18 x 28 cm ventral light mesh placed laparoscopically.  . TONSILLECTOMY    . UMBILICAL HERNIA REPAIR N/A 02/02/2015   Procedure: HERNIA REPAIR UMBILICAL ADULT;  Surgeon: Robert Bellow, MD;  Location: [facility] ORS;  Service: General;  Laterality: N/A;  . VENTRAL HERNIA REPAIR N/A 02/02/2015   Procedure: LAPAROSCOPIC VENTRAL HERNIA;  Surgeon: Robert Bellow, MD;  Location: [facility] ORS;  Service: General;  Laterality: N/A;  . vp shunt placement  1979    There were no vitals filed for this visit.      Subjective Assessment - 05/05/16 1225    Subjective Patient states he felt sore after the previous session  and says that he is having his worst day yet. His right shouder hurts, B knee pain R> L , back pain.    Pertinent History Pt reports a history of rapid onset balance difficulty since 06-04-2012. He reports that the initial episode occurred due to improper use of diuretic with hypokalemia. One year later he had another episode with a fall. He was sent to ENT and had a VNG study which was normal. ENT also ordered a head CT and MRI. MRI showed chronic R MCA infarct. Pt comes for [employer] 2-3x/wk. He has completed pulmonary rehab at [facility] twice. Pt denies dizziness or vertigo but reports difficulty with his balance    Limitations Walking   Patient Stated Goals Improve balance so that he only needs cane or no assistive device   Currently in Pain? Yes   Pain Score 3    Pain Location Knee   Pain Orientation Right;Left   Pain Descriptors / Indicators Aching   Pain Type Chronic pain   Pain Onset 1 to 4 weeks ago   Pain Frequency Constant   Aggravating Factors  standing, walking   Pain Relieving Factors rest   Effect of Pain on Daily Activities unable to perform   Multiple Pain Sites --  back, 4/10 , right shoulder 8/10      Treatment  Leg press x 20 x 3 sets 100 lbs Heel raises x 20 x 2 sets on leg press machine  Standing hip abd, hip extension BLE using railing for UE support Side stepping in parallel bars with UE support for balance Standing heel raises x 20  Step ups to 6 inch stool x 10 BLE Patient needs cues for posture and technique. He has left sided PF weakness .during heel raise. Patient has increased pain during step ups and hip extension but the pain improved with repetitions.                            PT Education - 05/05/16 1227    Education provided Yes   Education Details posture with exercise   Person(s) Educated Patient   Methods Explanation   Comprehension Verbalized understanding             PT Long Term Goals - 04/20/16 0826      PT LONG TERM  GOAL #1   Title Pt will be independent with HEP in order to improve strength and balance in order to decrease fall risk and improve function at home and work.   Time 8   Period Weeks   Status New     PT LONG TERM GOAL #2   Title Pt will improve BERG by at least 3 points in order to demonstrate clinically significant improvement in balance.    Baseline 04/19/16: 38/56   Time 8   Period Weeks   Status New     PT LONG TERM GOAL #3   Title Pt will decrease TUG to below 25 seconds in order to demonstrate decreased fall risk and improved LE strength      Baseline 04/19/16: 36.15 seconds   Time 8   Period Weeks   Status New     PT LONG TERM GOAL #4   Title Pt will increase 10MWT by at least 0.13 m/s in order to demonstrate clinically significant improvement in community ambulation.    Baseline 04/19/16: 0.75 m/s   Time 8   Period Days   Status New               Plan - 05/05/16 1228    Clinical Impression Statement Patient is limited by pain and is able to ambulate short distances without AD. He was instructed in LE exercises with rest periods inbetween due to pain and fatigue. Patlient has dificulty with flexibility and general movement including picking up his legs and getting on and off the machines. He will continue to benefit from skilled PT to strengthen his legs and improve his balance and overall mobility.    Rehab Potential Good   Clinical Impairments Affecting Rehab Potential Positive: motivation; Negative: chronic balance dysfunction, chronic CVA   PT Frequency 2x / week   PT Duration 8 weeks   PT Treatment/Interventions ADLs/Self Care Home Management;Cryotherapy;Moist Heat;Neuromuscular re-education;Gait training;Functional mobility training;Stair training;Therapeutic activities;Therapeutic exercise;Balance training;Manual techniques;Aquatic Therapy;Canalith Repostioning;Electrical Stimulation;Traction;Ultrasound;DME Instruction;Patient/family education;Passive range of  motion;Energy conservation;Vestibular   PT Next Visit Plan Progress HEP, continue LE strengthening and balance training   PT Home Exercise Plan advanced- see patient instructions;    Consulted and Agree with Plan of Care Patient      Patient will benefit from skilled therapeutic intervention in order to improve the following deficits and impairments:  Abnormal gait, Decreased activity tolerance, Decreased balance, Decreased strength, Difficulty walking, Obesity, Decreased coordination  Visit Diagnosis: Muscle weakness (generalized)  Difficulty in walking, not elsewhere classified  Gait difficulty     Problem List Patient Active Problem List   Diagnosis Date Noted  . Cough 03/20/2015  . Bronchitis, chronic obstructive w acute bronchitis ([place]) 03/20/2015  . Umbilical hernia without obstruction and without gangrene 01/12/2015  . Ventral hernia without obstruction or gangrene 01/02/2015   Alanson Puls, PT, DPT [place], [place] S 05/05/2016, 1:02 PM  [place] MAIN [facility] SERVICES [address] [place], [place], [address] Phone: [phone]   Fax:  [phone]  Name: Daniel Eaton. MRN: [id number] Date of Birth: 04-25-1940

## 2016-05-10 ENCOUNTER — Ambulatory Visit: Payer: PPO

## 2016-05-10 ENCOUNTER — Encounter: Payer: PPO | Admitting: Physical Therapy

## 2016-05-10 DIAGNOSIS — R262 Difficulty in walking, not elsewhere classified: Secondary | ICD-10-CM

## 2016-05-10 DIAGNOSIS — M6281 Muscle weakness (generalized): Secondary | ICD-10-CM

## 2016-05-10 DIAGNOSIS — R269 Unspecified abnormalities of gait and mobility: Secondary | ICD-10-CM

## 2016-05-10 NOTE — Therapy (Signed)
[place] MAIN [facility] SERVICES [address] [place], [place], [address] Phone: [phone]   Fax:  [phone]  Physical Therapy Treatment  Patient Details  Name: Daniel Eaton. MRN: [id number] Date of Birth: 1940-12-22 Referring Provider: Dr. Ouida Sills  Encounter Date: 05/10/2016      PT End of Session - 05/10/16 1318    Visit Number 6   Number of Visits 17   Date for PT Re-Evaluation 07-04-2016   Authorization Type g codes 6/10   PT Start Time 1302   PT Stop Time 1345   PT Time Calculation (min) 43 min   Equipment Utilized During Treatment Gait belt   Activity Tolerance Patient tolerated treatment well   Behavior During Therapy WFL for tasks assessed/performed      Past Medical History:  Diagnosis Date  . A-fib ([place])   . CHF (congestive heart failure) ([place])   . COPD (chronic obstructive pulmonary disease) ([place])   . Difficult intubation   . Hyperlipidemia   . Hypertension   . Hypothyroidism   . Kidney disease   . Sleep apnea   . Stroke ([place])   . Thyroid disease     Past Surgical History:  Procedure Laterality Date  . CARDIAC CATHETERIZATION    . CATARACT EXTRACTION W/ INTRAOCULAR LENS  IMPLANT, BILATERAL    . COLONOSCOPY  2008  . HERNIA REPAIR     bilateral inguinal hernia/ [place]  . HERNIA REPAIR  02/02/2015   18 x 28 cm ventral light mesh placed laparoscopically.  . TONSILLECTOMY    . UMBILICAL HERNIA REPAIR N/A 02/02/2015   Procedure: HERNIA REPAIR UMBILICAL ADULT;  Surgeon: Robert Bellow, MD;  Location: [facility] ORS;  Service: General;  Laterality: N/A;  . VENTRAL HERNIA REPAIR N/A 02/02/2015   Procedure: LAPAROSCOPIC VENTRAL HERNIA;  Surgeon: Robert Bellow, MD;  Location: [facility] ORS;  Service: General;  Laterality: N/A;  . vp shunt placement  1979    There were no vitals filed for this visit.      Subjective Assessment - 05/10/16 1313    Subjective Patient states he felt sore after the last visit. Patient  reports he ate some "bad" food and has taken up fluid into his legs.    Pertinent History Pt reports a history of rapid onset balance difficulty since 2014. He reports that the initial episode occurred due to improper use of diuretic with hypokalemia. One year later he had another episode with a fall. He was sent to ENT and had a VNG study which was normal. ENT also ordered a head CT and MRI. MRI showed chronic R MCA infarct. Pt comes for [employer] 2-3x/wk. He has completed pulmonary rehab at [facility] twice. Pt denies dizziness or vertigo but reports difficulty with his balance    Limitations Walking   Patient Stated Goals Improve balance so that he only needs cane or no assistive device   Currently in Pain? No/denies   Pain Onset 1 to 4 weeks ago      TREATMENT: Sit to stands - 2 x 11 from raised mat table Seated Hip abduction 2x20 bilaterally with GTB around knees Seated Hip flexion march 2x20 bilaterally with GTB Leg Press with focus on improving strength speed - 2 x 10, x30 120# Feet apart balance on half foam - with the balloon hitting 1 min x 3 Nustep level 15 with focus on improving LE strength and speed - 5 min      PT Education -  05/10/16 1317    Education provided Yes   Education Details form/technique with exercises   Person(s) Educated Patient   Methods Explanation;Demonstration   Comprehension Verbalized understanding;Returned demonstration             PT Long Term Goals - 04/20/16 0826      PT LONG TERM GOAL #1   Title Pt will be independent with HEP in order to improve strength and balance in order to decrease fall risk and improve function at home and work.   Time 8   Period Weeks   Status New     PT LONG TERM GOAL #2   Title Pt will improve BERG by at least 3 points in order to demonstrate clinically significant improvement in balance.    Baseline 04/19/16: 38/56   Time 8   Period Weeks   Status New     PT LONG TERM GOAL #3   Title Pt will decrease TUG to  below 25 seconds in order to demonstrate decreased fall risk and improved LE strength      Baseline 04/19/16: 36.15 seconds   Time 8   Period Weeks   Status New     PT LONG TERM GOAL #4   Title Pt will increase 10MWT by at least 0.13 m/s in order to demonstrate clinically significant improvement in community ambulation.    Baseline 04/19/16: 0.75 m/s   Time 8   Period Days   Status New               Plan - 05/10/16 1319    Clinical Impression Statement Focused on improving LE strength inparticularally quadriceps strength. Patient demonstrates increased fatigue during session and requires frequent sitting rest breaks throughout session. Patient also demonstrates increased postural sway  when performing balancing exercises and will benefit from further skilled therapy to return to prior level of function.    Rehab Potential Good   Clinical Impairments Affecting Rehab Potential Positive: motivation; Negative: chronic balance dysfunction, chronic CVA   PT Frequency 2x / week   PT Duration 8 weeks   PT Treatment/Interventions ADLs/Self Care Home Management;Cryotherapy;Moist Heat;Neuromuscular re-education;Gait training;Functional mobility training;Stair training;Therapeutic activities;Therapeutic exercise;Balance training;Manual techniques;Aquatic Therapy;Canalith Repostioning;Electrical Stimulation;Traction;Ultrasound;DME Instruction;Patient/family education;Passive range of motion;Energy conservation;Vestibular   PT Next Visit Plan Progress HEP, continue LE strengthening and balance training   PT Home Exercise Plan advanced- see patient instructions;    Consulted and Agree with Plan of Care Patient      Patient will benefit from skilled therapeutic intervention in order to improve the following deficits and impairments:  Abnormal gait, Decreased activity tolerance, Decreased balance, Decreased strength, Difficulty walking, Obesity, Decreased coordination  Visit Diagnosis: Muscle  weakness (generalized)  Difficulty in walking, not elsewhere classified  Gait difficulty     Problem List Patient Active Problem List   Diagnosis Date Noted  . Cough 03/20/2015  . Bronchitis, chronic obstructive w acute bronchitis ([place]) 03/20/2015  . Umbilical hernia without obstruction and without gangrene 01/12/2015  . Ventral hernia without obstruction or gangrene 01/02/2015    Blythe Stanford, PT DPT 05/10/2016, 1:53 PM  [place] MAIN [facility] SERVICES [address] [place], [place], [address] Phone: [phone]   Fax:  [phone]  Name: Daniel Eaton. MRN: [id number] Date of Birth: 01/17/1941

## 2016-05-12 DIAGNOSIS — M9904 Segmental and somatic dysfunction of sacral region: Secondary | ICD-10-CM | POA: Diagnosis not present

## 2016-05-12 DIAGNOSIS — M9907 Segmental and somatic dysfunction of upper extremity: Secondary | ICD-10-CM | POA: Diagnosis not present

## 2016-05-12 DIAGNOSIS — M9903 Segmental and somatic dysfunction of lumbar region: Secondary | ICD-10-CM | POA: Diagnosis not present

## 2016-05-12 DIAGNOSIS — M9902 Segmental and somatic dysfunction of thoracic region: Secondary | ICD-10-CM | POA: Diagnosis not present

## 2016-05-13 ENCOUNTER — Ambulatory Visit: Payer: PPO

## 2016-05-13 DIAGNOSIS — R262 Difficulty in walking, not elsewhere classified: Secondary | ICD-10-CM

## 2016-05-13 DIAGNOSIS — M6281 Muscle weakness (generalized): Secondary | ICD-10-CM

## 2016-05-13 DIAGNOSIS — R269 Unspecified abnormalities of gait and mobility: Secondary | ICD-10-CM

## 2016-05-13 NOTE — Therapy (Signed)
[place] MAIN [facility] SERVICES [address] [place], [place], [address] Phone: [phone]   Fax:  [phone]  Physical Therapy Treatment  Patient Details  Name: Daniel Eaton. MRN: [id number] Date of Birth: 1940/12/31 Referring Provider: Dr. Ouida Sills  Encounter Date: 05/13/2016      PT End of Session - 05/13/16 1105    Visit Number 7   Number of Visits 17   Date for PT Re-Evaluation 07/08/2016   Authorization Type g codes 7/10   PT Start Time 1050   PT Stop Time 1130   PT Time Calculation (min) 40 min   Equipment Utilized During Treatment Gait belt   Activity Tolerance Patient tolerated treatment well   Behavior During Therapy WFL for tasks assessed/performed      Past Medical History:  Diagnosis Date  . A-fib ([place])   . CHF (congestive heart failure) ([place])   . COPD (chronic obstructive pulmonary disease) ([place])   . Difficult intubation   . Hyperlipidemia   . Hypertension   . Hypothyroidism   . Kidney disease   . Sleep apnea   . Stroke ([place])   . Thyroid disease     Past Surgical History:  Procedure Laterality Date  . CARDIAC CATHETERIZATION    . CATARACT EXTRACTION W/ INTRAOCULAR LENS  IMPLANT, BILATERAL    . COLONOSCOPY  2008  . HERNIA REPAIR     bilateral inguinal hernia/ [place]  . HERNIA REPAIR  02/02/2015   18 x 28 cm ventral light mesh placed laparoscopically.  . TONSILLECTOMY    . UMBILICAL HERNIA REPAIR N/A 02/02/2015   Procedure: HERNIA REPAIR UMBILICAL ADULT;  Surgeon: Robert Bellow, MD;  Location: [facility] ORS;  Service: General;  Laterality: N/A;  . VENTRAL HERNIA REPAIR N/A 02/02/2015   Procedure: LAPAROSCOPIC VENTRAL HERNIA;  Surgeon: Robert Bellow, MD;  Location: [facility] ORS;  Service: General;  Laterality: N/A;  . vp shunt placement  1979    There were no vitals filed for this visit.      Subjective Assessment - 05/13/16 1058    Subjective Patient states his knees are feeling sore today, reports  they've been feeling sore all week. Patient reports he's been feeling stronger and more balanced.    Pertinent History Pt reports a history of rapid onset balance difficulty since 2014. He reports that the initial episode occurred due to improper use of diuretic with hypokalemia. One year later he had another episode with a fall. He was sent to ENT and had a VNG study which was normal. ENT also ordered a head CT and MRI. MRI showed chronic R MCA infarct. Pt comes for [employer] 2-3x/wk. He has completed pulmonary rehab at [facility] twice. Pt denies dizziness or vertigo but reports difficulty with his balance    Limitations Walking   Patient Stated Goals Improve balance so that he only needs cane or no assistive device   Currently in Pain? Yes   Pain Score 4    Pain Location Knee   Pain Orientation Right;Left   Pain Descriptors / Indicators Aching;Sore   Pain Type Chronic pain   Pain Onset 1 to 4 weeks ago       TREATMENT: Nustep level 5 with focus on improving LE strength and speed - 7 min; level 7-3 min Side stepping in \\ bars without UE support - x 3 down and back  Backwards amb in  bars without UE support - x 3 down and back Seated leaned  back on 3 wedges - SLR 3 x15 Feet apart balance on half foam - with the balloon hitting 1 min x 3        PT Education - 05/13/16 1104    Education provided Yes   Education Details form/technique with exercise   Person(s) Educated Patient   Methods Explanation;Demonstration   Comprehension Verbalized understanding;Returned demonstration             PT Long Term Goals - 04/20/16 0826      PT LONG TERM GOAL #1   Title Pt will be independent with HEP in order to improve strength and balance in order to decrease fall risk and improve function at home and work.   Time 8   Period Weeks   Status New     PT LONG TERM GOAL #2   Title Pt will improve BERG by at least 3 points in order to demonstrate clinically significant improvement in  balance.    Baseline 04/19/16: 38/56   Time 8   Period Weeks   Status New     PT LONG TERM GOAL #3   Title Pt will decrease TUG to below 25 seconds in order to demonstrate decreased fall risk and improved LE strength      Baseline 04/19/16: 36.15 seconds   Time 8   Period Weeks   Status New     PT LONG TERM GOAL #4   Title Pt will increase 10MWT by at least 0.13 m/s in order to demonstrate clinically significant improvement in community ambulation.    Baseline 04/19/16: 0.75 m/s   Time 8   Period Days   Status New               Plan - 05/13/16 1143    Clinical Impression Statement Focused on performing balancing and walking exercises to not aggravate pt's increased knee pain. Patient demonstrates increased LE weakness and decreased endurance requiring 2 sitting rest breaks during the session. Patient will benefit from further skilled therapy focused on improving LE strength and balance to return to prior level of function.    Rehab Potential Good   Clinical Impairments Affecting Rehab Potential Positive: motivation; Negative: chronic balance dysfunction, chronic CVA   PT Frequency 2x / week   PT Duration 8 weeks   PT Treatment/Interventions ADLs/Self Care Home Management;Cryotherapy;Moist Heat;Neuromuscular re-education;Gait training;Functional mobility training;Stair training;Therapeutic activities;Therapeutic exercise;Balance training;Manual techniques;Aquatic Therapy;Canalith Repostioning;Electrical Stimulation;Traction;Ultrasound;DME Instruction;Patient/family education;Passive range of motion;Energy conservation;Vestibular   PT Next Visit Plan Progress HEP, continue LE strengthening and balance training   PT Home Exercise Plan advanced- see patient instructions;    Consulted and Agree with Plan of Care Patient      Patient will benefit from skilled therapeutic intervention in order to improve the following deficits and impairments:  Abnormal gait, Decreased activity  tolerance, Decreased balance, Decreased strength, Difficulty walking, Obesity, Decreased coordination  Visit Diagnosis: Muscle weakness (generalized)  Difficulty in walking, not elsewhere classified  Gait difficulty     Problem List Patient Active Problem List   Diagnosis Date Noted  . Cough 03/20/2015  . Bronchitis, chronic obstructive w acute bronchitis ([place]) 03/20/2015  . Umbilical hernia without obstruction and without gangrene 01/12/2015  . Ventral hernia without obstruction or gangrene 01/02/2015    Blythe Stanford, PT DPT 05/13/2016, 11:49 AM  [place] MAIN [facility] SERVICES [address] [place], [place], [address] Phone: [phone]   Fax:  [phone]  Name: Daniel Eaton. MRN: [id number] Date of  Birth: [birth year]

## 2016-05-16 ENCOUNTER — Ambulatory Visit: Payer: PPO | Attending: Internal Medicine

## 2016-05-16 DIAGNOSIS — M6281 Muscle weakness (generalized): Secondary | ICD-10-CM | POA: Insufficient documentation

## 2016-05-16 DIAGNOSIS — R262 Difficulty in walking, not elsewhere classified: Secondary | ICD-10-CM | POA: Diagnosis not present

## 2016-05-16 DIAGNOSIS — R269 Unspecified abnormalities of gait and mobility: Secondary | ICD-10-CM | POA: Diagnosis not present

## 2016-05-16 NOTE — Therapy (Signed)
[place] MAIN [facility] SERVICES [address] [place], [place], [address] Phone: [phone]   Fax:  [phone]  Physical Therapy Treatment  Patient Details  Name: Daniel Eaton. MRN: [id number] Date of Birth: Jun 01, 1940 Referring Provider: Dr. Ouida Sills  Encounter Date: 05/16/2016      PT End of Session - 05/16/16 1036    Visit Number 8   Number of Visits 17   Date for PT Re-Evaluation 07/11/2016   Authorization Type g codes 8/10   PT Start Time 1022   PT Stop Time 1100   PT Time Calculation (min) 38 min   Equipment Utilized During Treatment Gait belt   Activity Tolerance Patient tolerated treatment well   Behavior During Therapy WFL for tasks assessed/performed      Past Medical History:  Diagnosis Date  . A-fib ([place])   . CHF (congestive heart failure) ([place])   . COPD (chronic obstructive pulmonary disease) ([place])   . Difficult intubation   . Hyperlipidemia   . Hypertension   . Hypothyroidism   . Kidney disease   . Sleep apnea   . Stroke ([place])   . Thyroid disease     Past Surgical History:  Procedure Laterality Date  . CARDIAC CATHETERIZATION    . CATARACT EXTRACTION W/ INTRAOCULAR LENS  IMPLANT, BILATERAL    . COLONOSCOPY  2008  . HERNIA REPAIR     bilateral inguinal hernia/ [place]  . HERNIA REPAIR  02/02/2015   18 x 28 cm ventral light mesh placed laparoscopically.  . TONSILLECTOMY    . UMBILICAL HERNIA REPAIR N/A 02/02/2015   Procedure: HERNIA REPAIR UMBILICAL ADULT;  Surgeon: Robert Bellow, MD;  Location: [facility] ORS;  Service: General;  Laterality: N/A;  . VENTRAL HERNIA REPAIR N/A 02/02/2015   Procedure: LAPAROSCOPIC VENTRAL HERNIA;  Surgeon: Robert Bellow, MD;  Location: [facility] ORS;  Service: General;  Laterality: N/A;  . vp shunt placement  1979    There were no vitals filed for this visit.      Subjective Assessment - 05/16/16 1029    Subjective Patient reports he's had increased soreness this weekend.  Patient reports he feels more balanced when standing and it's easier to go from sitting to standing.    Pertinent History Pt reports a history of rapid onset balance difficulty since 2014. He reports that the initial episode occurred due to improper use of diuretic with hypokalemia. One year later he had another episode with a fall. He was sent to ENT and had a VNG study which was normal. ENT also ordered a head CT and MRI. MRI showed chronic R MCA infarct. Pt comes for [employer] 2-3x/wk. He has completed pulmonary rehab at [facility] twice. Pt denies dizziness or vertigo but reports difficulty with his balance    Limitations Walking   Patient Stated Goals Improve balance so that he only needs cane or no assistive device   Currently in Pain? Yes   Pain Score 3    Pain Location Knee   Pain Orientation Right;Left   Pain Descriptors / Indicators Aching;Sore   Pain Onset 1 to 4 weeks ago   Pain Frequency Constant      TREATMENT: Nustep level 7 with focus on improving LE strength and speed - 5  min;  Feet together toe/heel rocking on half foam roller - x 2 min Heel raises off of blue half foam roller - x15 Stepping forward/backward over halffoam roller - x 20 Side stepping reaching  out to touch half foam on both sides with intermittent UE support - x 25 Feet apart balance on half foam - with the balloon hitting x45 Leg Press for quadriceps strength and endurance - x20 90#; x20 105#; x10 120#       PT Education - 05/16/16 1034    Education provided Yes   Education Details form/technique with exercise    Person(s) Educated Patient   Methods Explanation;Demonstration   Comprehension Verbalized understanding;Returned demonstration             PT Long Term Goals - 04/20/16 0826      PT LONG TERM GOAL #1   Title Pt will be independent with HEP in order to improve strength and balance in order to decrease fall risk and improve function at home and work.   Time 8   Period Weeks   Status  New     PT LONG TERM GOAL #2   Title Pt will improve BERG by at least 3 points in order to demonstrate clinically significant improvement in balance.    Baseline 04/19/16: 38/56   Time 8   Period Weeks   Status New     PT LONG TERM GOAL #3   Title Pt will decrease TUG to below 25 seconds in order to demonstrate decreased fall risk and improved LE strength      Baseline 04/19/16: 36.15 seconds   Time 8   Period Weeks   Status New     PT LONG TERM GOAL #4   Title Pt will increase 10MWT by at least 0.13 m/s in order to demonstrate clinically significant improvement in community ambulation.    Baseline 04/19/16: 0.75 m/s   Time 8   Period Days   Status New               Plan - 05/16/16 1100    Clinical Impression Statement Continued to focus on improving LE strength and balance in the  bars today as patient continues to demonstrate increased LE weakness and decreased static/dynamic balance. Patient decreased balance is indicated by increased postural sway and patient will benefit from further skilled therapy to return to prior level of function.    Rehab Potential Good   Clinical Impairments Affecting Rehab Potential Positive: motivation; Negative: chronic balance dysfunction, chronic CVA   PT Frequency 2x / week   PT Duration 8 weeks   PT Treatment/Interventions ADLs/Self Care Home Management;Cryotherapy;Moist Heat;Neuromuscular re-education;Gait training;Functional mobility training;Stair training;Therapeutic activities;Therapeutic exercise;Balance training;Manual techniques;Aquatic Therapy;Canalith Repostioning;Electrical Stimulation;Traction;Ultrasound;DME Instruction;Patient/family education;Passive range of motion;Energy conservation;Vestibular   PT Next Visit Plan Progress HEP, continue LE strengthening and balance training   PT Home Exercise Plan advanced- see patient instructions;    Consulted and Agree with Plan of Care Patient      Patient will benefit from skilled  therapeutic intervention in order to improve the following deficits and impairments:  Abnormal gait, Decreased activity tolerance, Decreased balance, Decreased strength, Difficulty walking, Obesity, Decreased coordination  Visit Diagnosis: Muscle weakness (generalized)  Difficulty in walking, not elsewhere classified  Gait difficulty     Problem List Patient Active Problem List   Diagnosis Date Noted  . Cough 03/20/2015  . Bronchitis, chronic obstructive w acute bronchitis ([place]) 03/20/2015  . Umbilical hernia without obstruction and without gangrene 01/12/2015  . Ventral hernia without obstruction or gangrene 01/02/2015    Blythe Stanford, PT DPT 05/16/2016, 11:17 AM  [place] MAIN [facility] SERVICES [address] [place], [place], [address]  Phone: [phone]   Fax:  [phone]  Name: Daniel Eaton. MRN: [id number] Date of Birth: 1940/11/20

## 2016-05-17 ENCOUNTER — Encounter: Payer: PPO | Admitting: Physical Therapy

## 2016-05-19 ENCOUNTER — Ambulatory Visit: Payer: PPO

## 2016-05-19 DIAGNOSIS — R269 Unspecified abnormalities of gait and mobility: Secondary | ICD-10-CM

## 2016-05-19 DIAGNOSIS — M6281 Muscle weakness (generalized): Secondary | ICD-10-CM | POA: Diagnosis not present

## 2016-05-19 DIAGNOSIS — R262 Difficulty in walking, not elsewhere classified: Secondary | ICD-10-CM

## 2016-05-19 NOTE — Therapy (Signed)
[place] MAIN [facility] SERVICES [address] [place], [place], [address] Phone: [phone]   Fax:  [phone]  Physical Therapy Treatment  Patient Details  Name: Daniel Eaton. MRN: [id number] Date of Birth: [birth year] Referring Provider: Dr. Ouida Sills  Encounter Date: 05/19/2016      PT End of Session - 05/19/16 1047    Visit Number 9   Number of Visits 17   Date for PT Re-Evaluation 06/28/16   Authorization Type g codes 2022-11-08   PT Start Time 1022   PT Stop Time 1100   PT Time Calculation (min) 38 min   Equipment Utilized During Treatment Gait belt   Activity Tolerance Patient tolerated treatment well   Behavior During Therapy WFL for tasks assessed/performed      Past Medical History:  Diagnosis Date  . A-fib ([place])   . CHF (congestive heart failure) ([place])   . COPD (chronic obstructive pulmonary disease) ([place])   . Difficult intubation   . Hyperlipidemia   . Hypertension   . Hypothyroidism   . Kidney disease   . Sleep apnea   . Stroke ([place])   . Thyroid disease     Past Surgical History:  Procedure Laterality Date  . CARDIAC CATHETERIZATION    . CATARACT EXTRACTION W/ INTRAOCULAR LENS  IMPLANT, BILATERAL    . COLONOSCOPY  2008  . HERNIA REPAIR     bilateral inguinal hernia/ [place]  . HERNIA REPAIR  02/02/2015   18 x 28 cm ventral light mesh placed laparoscopically.  . TONSILLECTOMY    . UMBILICAL HERNIA REPAIR N/A 02/02/2015   Procedure: HERNIA REPAIR UMBILICAL ADULT;  Surgeon: Robert Bellow, MD;  Location: [facility] ORS;  Service: General;  Laterality: N/A;  . VENTRAL HERNIA REPAIR N/A 02/02/2015   Procedure: LAPAROSCOPIC VENTRAL HERNIA;  Surgeon: Robert Bellow, MD;  Location: [facility] ORS;  Service: General;  Laterality: N/A;  . vp shunt placement  1979    There were no vitals filed for this visit.      Subjective Assessment - 05/19/16 1038    Subjective Patient reports he's feeling okay today but reports his  breathing feels more labored today. Patient reports he's making functional change but continues to get sore after therapy   Pertinent History Pt reports a history of rapid onset balance difficulty since 2014. He reports that the initial episode occurred due to improper use of diuretic with hypokalemia. One year later he had another episode with a fall. He was sent to ENT and had a VNG study which was normal. ENT also ordered a head CT and MRI. MRI showed chronic R MCA infarct. Pt comes for [employer] 2-3x/wk. He has completed pulmonary rehab at [facility] twice. Pt denies dizziness or vertigo but reports difficulty with his balance    Limitations Walking   Patient Stated Goals Improve balance so that he only needs cane or no assistive device   Currently in Pain? Yes   Pain Score 4    Pain Location Back   Pain Orientation Lower;Right;Left   Pain Descriptors / Indicators Tender   Pain Type Chronic pain   Pain Onset 1 to 4 weeks ago      TREATMENT: Nustep level 8 with focus on improving LE strength and speed - 5  min;  Feet apart balance on half foam - x58min Standing performing circles on airex pad - x5 each direction (L side pt performs quicker and with less effort) Feet on two Balance  stones - 2 x 2 mins with intermittent UE support  Tandem stance balance on half foam - 40sec x 2 B Backwards amb in  bars - x5 down and back        PT Education - 05/19/16 1044    Education provided Yes   Education Details Importance of rest when being sore   Person(s) Educated Patient   Methods Explanation;Demonstration   Comprehension Verbalized understanding;Returned demonstration             PT Long Term Goals - 04/20/16 0826      PT LONG TERM GOAL #1   Title Pt will be independent with HEP in order to improve strength and balance in order to decrease fall risk and improve function at home and work.   Time 8   Period Weeks   Status New     PT LONG TERM GOAL #2   Title Pt will improve BERG  by at least 3 points in order to demonstrate clinically significant improvement in balance.    Baseline 04/19/16: 38/56   Time 8   Period Weeks   Status New     PT LONG TERM GOAL #3   Title Pt will decrease TUG to below 25 seconds in order to demonstrate decreased fall risk and improved LE strength      Baseline 04/19/16: 36.15 seconds   Time 8   Period Weeks   Status New     PT LONG TERM GOAL #4   Title Pt will increase 10MWT by at least 0.13 m/s in order to demonstrate clinically significant improvement in community ambulation.    Baseline 04/19/16: 0.75 m/s   Time 8   Period Days   Status New               Plan - 05/19/16 1056    Clinical Impression Statement Patient demonstrates decreased endurance today as patient requires greater amount of sitting rest breaks and educated patient on importance of rest breaks throughout the week. Patient demonstrates increased postural sway with balancing exercises and will benefit from further skilled therapy to return to prior level of function.      Rehab Potential Good   Clinical Impairments Affecting Rehab Potential Positive: motivation; Negative: chronic balance dysfunction, chronic CVA   PT Frequency 2x / week   PT Duration 8 weeks   PT Treatment/Interventions ADLs/Self Care Home Management;Cryotherapy;Moist Heat;Neuromuscular re-education;Gait training;Functional mobility training;Stair training;Therapeutic activities;Therapeutic exercise;Balance training;Manual techniques;Aquatic Therapy;Canalith Repostioning;Electrical Stimulation;Traction;Ultrasound;DME Instruction;Patient/family education;Passive range of motion;Energy conservation;Vestibular   PT Next Visit Plan Progress HEP, continue LE strengthening and balance training   PT Home Exercise Plan advanced- see patient instructions;    Consulted and Agree with Plan of Care Patient      Patient will benefit from skilled therapeutic intervention in order to improve the following  deficits and impairments:  Abnormal gait, Decreased activity tolerance, Decreased balance, Decreased strength, Difficulty walking, Obesity, Decreased coordination  Visit Diagnosis: Muscle weakness (generalized)  Difficulty in walking, not elsewhere classified  Gait difficulty     Problem List Patient Active Problem List   Diagnosis Date Noted  . Cough 03/20/2015  . Bronchitis, chronic obstructive w acute bronchitis ([place]) 03/20/2015  . Umbilical hernia without obstruction and without gangrene 01/12/2015  . Ventral hernia without obstruction or gangrene 01/02/2015    Blythe Stanford, PT DPT 05/19/2016, 11:02 AM  [place] MAIN [facility] SERVICES [address] [place], [place], [address] Phone: [phone]   Fax:  [phone]  Name: Daniel Eaton. MRN: [id number] Date of Birth: 03-19-1940

## 2016-05-23 DIAGNOSIS — M9903 Segmental and somatic dysfunction of lumbar region: Secondary | ICD-10-CM | POA: Diagnosis not present

## 2016-05-23 DIAGNOSIS — M9904 Segmental and somatic dysfunction of sacral region: Secondary | ICD-10-CM | POA: Diagnosis not present

## 2016-05-23 DIAGNOSIS — M9902 Segmental and somatic dysfunction of thoracic region: Secondary | ICD-10-CM | POA: Diagnosis not present

## 2016-05-24 ENCOUNTER — Ambulatory Visit: Payer: PPO | Attending: Internal Medicine

## 2016-05-24 ENCOUNTER — Encounter: Payer: PPO | Admitting: Physical Therapy

## 2016-05-24 DIAGNOSIS — R262 Difficulty in walking, not elsewhere classified: Secondary | ICD-10-CM | POA: Insufficient documentation

## 2016-05-24 DIAGNOSIS — M6281 Muscle weakness (generalized): Secondary | ICD-10-CM

## 2016-05-24 NOTE — Therapy (Signed)
[place] MAIN [facility] SERVICES [address] [place], [place], [address] Phone: [phone]   Fax:  [phone]  Physical Therapy Treatment  Patient Details  Name: Daniel Eaton. MRN: [id number] Date of Birth: Feb 23, 1940 Referring Provider: Dr. Ouida Sills  Encounter Date: 05/24/2016      PT End of Session - 05/24/16 1041    Visit Number 10   Number of Visits 17   Date for PT Re-Evaluation 19-Jun-2016   Authorization Type g codes 10/10   PT Start Time 1022   PT Stop Time 1100   PT Time Calculation (min) 38 min   Equipment Utilized During Treatment Gait belt   Activity Tolerance Patient tolerated treatment well   Behavior During Therapy WFL for tasks assessed/performed      Past Medical History:  Diagnosis Date  . A-fib ([place])   . CHF (congestive heart failure) ([place])   . COPD (chronic obstructive pulmonary disease) ([place])   . Difficult intubation   . Hyperlipidemia   . Hypertension   . Hypothyroidism   . Kidney disease   . Sleep apnea   . Stroke ([place])   . Thyroid disease     Past Surgical History:  Procedure Laterality Date  . CARDIAC CATHETERIZATION    . CATARACT EXTRACTION W/ INTRAOCULAR LENS  IMPLANT, BILATERAL    . COLONOSCOPY  2008  . HERNIA REPAIR     bilateral inguinal hernia/ [place]  . HERNIA REPAIR  02/02/2015   18 x 28 cm ventral light mesh placed laparoscopically.  . TONSILLECTOMY    . UMBILICAL HERNIA REPAIR N/A 02/02/2015   Procedure: HERNIA REPAIR UMBILICAL ADULT;  Surgeon: Robert Bellow, MD;  Location: [facility] ORS;  Service: General;  Laterality: N/A;  . VENTRAL HERNIA REPAIR N/A 02/02/2015   Procedure: LAPAROSCOPIC VENTRAL HERNIA;  Surgeon: Robert Bellow, MD;  Location: [facility] ORS;  Service: General;  Laterality: N/A;  . vp shunt placement  1979    There were no vitals filed for this visit.      Subjective Assessment - 05/24/16 1042    Subjective Patient reports he's walking much easier but is  experieencing more breathlessness when ambulating. Patient reports he believes the breathlessness is from the increased pollen in the air.    Pertinent History Pt reports a history of rapid onset balance difficulty since 2014. He reports that the initial episode occurred due to improper use of diuretic with hypokalemia. One year later he had another episode with a fall. He was sent to ENT and had a VNG study which was normal. ENT also ordered a head CT and MRI. MRI showed chronic R MCA infarct. Pt comes for [employer] 2-3x/wk. He has completed pulmonary rehab at [facility] twice. Pt denies dizziness or vertigo but reports difficulty with his balance    Limitations Walking   Patient Stated Goals Improve balance so that he only needs cane or no assistive device   Currently in Pain? Yes   Pain Score 4    Pain Location Back   Pain Orientation Left;Right;Lower   Pain Descriptors / Indicators Tender   Pain Type Chronic pain   Pain Onset 1 to 4 weeks ago   Pain Frequency Constant            [facility] PT Assessment - 05/24/16 0001      Standardized Balance Assessment   10 Meter Walk 12sec: .85m/s     Berg Balance Test   Sit to Stand Able to stand  without using hands and stabilize independently   Standing Unsupported Able to stand safely 2 minutes   Sitting with Back Unsupported but Feet Supported on Floor or Stool Able to sit safely and securely 2 minutes   Stand to Sit Sits safely with minimal use of hands   Transfers Able to transfer safely, definite need of hands   Standing Unsupported with Eyes Closed Able to stand 10 seconds with supervision   Standing Ubsupported with Feet Together Able to place feet together independently and stand 1 minute safely   From Standing, Reach Forward with Outstretched Arm Can reach confidently >25 cm (10")   From Standing Position, Pick up Object from Floor Able to pick up shoe safely and easily   From Standing Position, Turn to Look Behind Over each Shoulder Looks  behind one side only/other side shows less weight shift   Turn 360 Degrees Able to turn 360 degrees safely but slowly   Standing Unsupported, Alternately Place Feet on Step/Stool Able to complete 4 steps without aid or supervision   Standing Unsupported, One Foot in Front Able to take small step independently and hold 30 seconds   Standing on One Leg Tries to lift leg/unable to hold 3 seconds but remains standing independently   Total Score 44     Timed Up and Go Test   Normal TUG (seconds) 20       TREATMENT: Feet apart balance on half foam (flat side up)- x41min Standing performing circles on flat ground - x1 each direction (L side pt performs quicker and with less effort) Step taps onto 8" step - x 15 on each side Widened semi-tandem stance - 45 sec B Steps forward/backward onto 2 half foam roller  Standing Marches on flat ground - x 10  Feet on two Balance stones -  x 2 mins with intermittent UE support  Ball toss with feet together on blue airex pad - x 20  Side stepping with holding 4 # ball - x 2 down and back in  bars        PT Education - 05/24/16 1046    Education provided Yes   Education Details Form/technique with exercise   Person(s) Educated Patient   Methods Explanation;Demonstration   Comprehension Verbalized understanding;Returned demonstration             PT Long Term Goals - 05/24/16 1028      PT LONG TERM GOAL #1   Title Pt will be independent with HEP in order to improve strength and balance in order to decrease fall risk and improve function at home and work.   Time 8   Period Weeks   Status On-going     PT LONG TERM GOAL #2   Title Pt will improve BERG to 48 points in order to demonstrate clinically significant improvement in balance.    Baseline 04/19/16: 38/56; 05/24/16: 44/56   Time 8   Period Weeks   Status On-going     PT LONG TERM GOAL #3   Title Pt will decrease TUG to below 25 seconds in order to demonstrate decreased fall risk and  improved LE strength      Baseline 04/19/16: 36.15 seconds; 05/24/16: 20sec   Time 8   Period Weeks   Status Achieved     PT LONG TERM GOAL #4   Title Pt will increase 10MWT by at least 0.13 m/s in order to demonstrate clinically significant improvement in community ambulation.    Baseline 04/19/16: 0.75  m/s; 06-23-2016 .833   Time 8   Period Days   Status On-going               Plan - 06/23/2016 1100    Clinical Impression Statement Patient demonstratres improvement in the TUG, 28mWT, and BERG balance indicating functional improvement in static balance and decrease in fall risk. Although patient is improving, he conitnues to demonstrate decreased cardiovasuclar endurance, dynamic balance and decreased walking speed with ambulation. Patient will be benefit from further skilled therapy focused on improving current impairments to return to prior level of function.    Rehab Potential Good   Clinical Impairments Affecting Rehab Potential Positive: motivation; Negative: chronic balance dysfunction, chronic CVA   PT Frequency 2x / week   PT Duration 8 weeks   PT Treatment/Interventions ADLs/Self Care Home Management;Cryotherapy;Moist Heat;Neuromuscular re-education;Gait training;Functional mobility training;Stair training;Therapeutic activities;Therapeutic exercise;Balance training;Manual techniques;Aquatic Therapy;Canalith Repostioning;Electrical Stimulation;Traction;Ultrasound;DME Instruction;Patient/family education;Passive range of motion;Energy conservation;Vestibular   PT Next Visit Plan Progress HEP, continue LE strengthening and balance training   PT Home Exercise Plan advanced- see patient instructions;    Consulted and Agree with Plan of Care Patient      Patient will benefit from skilled therapeutic intervention in order to improve the following deficits and impairments:  Abnormal gait, Decreased activity tolerance, Decreased balance, Decreased strength, Difficulty walking, Obesity,  Decreased coordination  Visit Diagnosis: Muscle weakness (generalized)  Difficulty in walking, not elsewhere classified       G-Codes - 2016-06-23 1108    Functional Assessment Tool Used (Outpatient Only) Berg balance test, TUG, 68m gait speed, clinical judgement   Functional Limitation Mobility: Walking and moving around   Mobility: Walking and Moving Around Current Status [id number]) At least 40 percent but less than 60 percent impaired, limited or restricted   Mobility: Walking and Moving Around Goal Status [phone]) At least 20 percent but less than 40 percent impaired, limited or restricted      Problem List Patient Active Problem List   Diagnosis Date Noted  . Cough 03/20/2015  . Bronchitis, chronic obstructive w acute bronchitis ([place]) 03/20/2015  . Umbilical hernia without obstruction and without gangrene 01/12/2015  . Ventral hernia without obstruction or gangrene 01/02/2015    Blythe Stanford, PT DPT 2016-06-23, 11:09 AM  [place] MAIN [facility] SERVICES [address] [place], [place], [address] Phone: [phone]   Fax:  [phone]  Name: Daniel Eaton. MRN: [id number] Date of Birth: 1940-11-16

## 2016-05-26 ENCOUNTER — Ambulatory Visit: Payer: PPO

## 2016-05-26 DIAGNOSIS — R269 Unspecified abnormalities of gait and mobility: Secondary | ICD-10-CM

## 2016-05-26 DIAGNOSIS — R262 Difficulty in walking, not elsewhere classified: Secondary | ICD-10-CM

## 2016-05-26 DIAGNOSIS — M6281 Muscle weakness (generalized): Secondary | ICD-10-CM | POA: Diagnosis not present

## 2016-05-26 NOTE — Therapy (Signed)
[place] MAIN [facility] SERVICES [address] [place], [place], [address] Phone: [phone]   Fax:  [phone]  Physical Therapy Treatment  Patient Details  Name: Daniel Eaton. MRN: [id number] Date of Birth: 1940/11/03 Referring Provider: Dr. Ouida Sills  Encounter Date: 05/26/2016      PT End of Session - 05/26/16 1001    Visit Number 11   Number of Visits 17   Date for PT Re-Evaluation 07-06-2016   Authorization Type g codes 1/10   PT Start Time 0938   PT Stop Time 1016   PT Time Calculation (min) 38 min   Equipment Utilized During Treatment Gait belt   Activity Tolerance Patient tolerated treatment well   Behavior During Therapy WFL for tasks assessed/performed      Past Medical History:  Diagnosis Date  . A-fib ([place])   . CHF (congestive heart failure) ([place])   . COPD (chronic obstructive pulmonary disease) ([place])   . Difficult intubation   . Hyperlipidemia   . Hypertension   . Hypothyroidism   . Kidney disease   . Sleep apnea   . Stroke ([place])   . Thyroid disease     Past Surgical History:  Procedure Laterality Date  . CARDIAC CATHETERIZATION    . CATARACT EXTRACTION W/ INTRAOCULAR LENS  IMPLANT, BILATERAL    . COLONOSCOPY  06/05/2006  . HERNIA REPAIR     bilateral inguinal hernia/ [place]  . HERNIA REPAIR  02/02/2015   18 x 28 cm ventral light mesh placed laparoscopically.  . TONSILLECTOMY    . UMBILICAL HERNIA REPAIR N/A 02/02/2015   Procedure: HERNIA REPAIR UMBILICAL ADULT;  Surgeon: Robert Bellow, MD;  Location: [facility] ORS;  Service: General;  Laterality: N/A;  . VENTRAL HERNIA REPAIR N/A 02/02/2015   Procedure: LAPAROSCOPIC VENTRAL HERNIA;  Surgeon: Robert Bellow, MD;  Location: [facility] ORS;  Service: General;  Laterality: N/A;  . vp shunt placement  1979    There were no vitals filed for this visit.      Subjective Assessment - 05/26/16 0955    Subjective Patient reports he would like to work on his leg  strength today, patient reports he knees are a little sore today.    Pertinent History Pt reports a history of rapid onset balance difficulty since 06-04-2012. He reports that the initial episode occurred due to improper use of diuretic with hypokalemia. One year later he had another episode with a fall. He was sent to ENT and had a VNG study which was normal. ENT also ordered a head CT and MRI. MRI showed chronic R MCA infarct. Pt comes for [employer] 2-3x/wk. He has completed pulmonary rehab at [facility] twice. Pt denies dizziness or vertigo but reports difficulty with his balance    Limitations Walking   Patient Stated Goals Improve balance so that he only needs cane or no assistive device   Currently in Pain? Yes   Pain Score 2    Pain Location Knee   Pain Orientation Left;Right   Pain Descriptors / Indicators Tender;Sore   Pain Type Chronic pain;Acute pain   Pain Onset 1 to 4 weeks ago   Pain Frequency Intermittent      TREATMENT Feet apart balance on half foam (flat side up) - x74min Standing performing circles on flat ground - x1 each direction (L side pt performs quicker and with less effort) Widened semi-tandem stance - 45 sec B Steps forward/backward onto 2 half foam roller  Standing  Marches on flat ground - x 10  Feet on two Balance stones -  x 2 mins with intermittent UE support  Ball toss with feet together on blue airex pad - x 20  Side stepping with holding 4 # ball - x 2 down and back in  bars        PT Education - 05/26/16 0957    Education provided Yes   Education Details form/technique with exercise   Person(s) Educated Patient   Methods Explanation;Demonstration   Comprehension Verbalized understanding;Returned demonstration             PT Long Term Goals - 05/24/16 1028      PT LONG TERM GOAL #1   Title Pt will be independent with HEP in order to improve strength and balance in order to decrease fall risk and improve function at home and work.   Time 8    Period Weeks   Status On-going     PT LONG TERM GOAL #2   Title Pt will improve BERG to 48 points in order to demonstrate clinically significant improvement in balance.    Baseline 04/19/16: 38/56; 05/24/16: 44/56   Time 8   Period Weeks   Status On-going     PT LONG TERM GOAL #3   Title Pt will decrease TUG to below 25 seconds in order to demonstrate decreased fall risk and improved LE strength      Baseline 04/19/16: 36.15 seconds; 05/24/16: 20sec   Time 8   Period Weeks   Status Achieved     PT LONG TERM GOAL #4   Title Pt will increase 10MWT by at least 0.13 m/s in order to demonstrate clinically significant improvement in community ambulation.    Baseline 04/19/16: 0.75 m/s; 05/24/2016 .833   Time 8   Period Days   Status On-going               Plan - 05/26/16 1004    Clinical Impression Statement Focused on performing standing balance and LE strengthening exercises as patient continues to have decreased stabilization in standing. Patient demonstrates decreased confidence with balancing exercises requiring to use his UE indicating decreased dynamic/static balance. Patient will benefit from further skilled therapy to return to prior level of function.   Rehab Potential Good   Clinical Impairments Affecting Rehab Potential Positive: motivation; Negative: chronic balance dysfunction, chronic CVA   PT Frequency 2x / week   PT Duration 8 weeks   PT Treatment/Interventions ADLs/Self Care Home Management;Cryotherapy;Moist Heat;Neuromuscular re-education;Gait training;Functional mobility training;Stair training;Therapeutic activities;Therapeutic exercise;Balance training;Manual techniques;Aquatic Therapy;Canalith Repostioning;Electrical Stimulation;Traction;Ultrasound;DME Instruction;Patient/family education;Passive range of motion;Energy conservation;Vestibular   PT Next Visit Plan Progress HEP, continue LE strengthening and balance training   PT Home Exercise Plan advanced- see patient  instructions;    Consulted and Agree with Plan of Care Patient      Patient will benefit from skilled therapeutic intervention in order to improve the following deficits and impairments:  Abnormal gait, Decreased activity tolerance, Decreased balance, Decreased strength, Difficulty walking, Obesity, Decreased coordination  Visit Diagnosis: Muscle weakness (generalized)  Difficulty in walking, not elsewhere classified  Gait difficulty     Problem List Patient Active Problem List   Diagnosis Date Noted  . Cough 03/20/2015  . Bronchitis, chronic obstructive w acute bronchitis ([place]) 03/20/2015  . Umbilical hernia without obstruction and without gangrene 01/12/2015  . Ventral hernia without obstruction or gangrene 01/02/2015    Blythe Stanford, PT DPT 05/26/2016, 10:25 AM  [place]  [place], [place], [address] Phone: [phone]   Fax:  [phone]  Name: Marcelles Clinard. MRN: [id number] Date of Birth: 1940/02/24

## 2016-05-31 ENCOUNTER — Ambulatory Visit: Payer: PPO

## 2016-05-31 ENCOUNTER — Encounter: Payer: PPO | Admitting: Physical Therapy

## 2016-05-31 DIAGNOSIS — E039 Hypothyroidism, unspecified: Secondary | ICD-10-CM | POA: Diagnosis not present

## 2016-05-31 DIAGNOSIS — I129 Hypertensive chronic kidney disease with stage 1 through stage 4 chronic kidney disease, or unspecified chronic kidney disease: Secondary | ICD-10-CM | POA: Diagnosis not present

## 2016-05-31 DIAGNOSIS — R269 Unspecified abnormalities of gait and mobility: Secondary | ICD-10-CM

## 2016-05-31 DIAGNOSIS — Z136 Encounter for screening for cardiovascular disorders: Secondary | ICD-10-CM | POA: Diagnosis not present

## 2016-05-31 DIAGNOSIS — R262 Difficulty in walking, not elsewhere classified: Secondary | ICD-10-CM

## 2016-05-31 DIAGNOSIS — I482 Chronic atrial fibrillation: Secondary | ICD-10-CM | POA: Diagnosis not present

## 2016-05-31 DIAGNOSIS — M6281 Muscle weakness (generalized): Secondary | ICD-10-CM

## 2016-05-31 DIAGNOSIS — N183 Chronic kidney disease, stage 3 (moderate): Secondary | ICD-10-CM | POA: Diagnosis not present

## 2016-05-31 NOTE — Therapy (Signed)
[place] MAIN [facility] SERVICES [address] [place], [place], [address] Phone: [phone]   Fax:  [phone]  Physical Therapy Treatment  Patient Details  Name: Daniel Eaton. MRN: [id number] Date of Birth: 01-01-1941 Referring Provider: Dr. Ouida Sills  Encounter Date: 05/31/2016      PT End of Session - 05/31/16 1036    Visit Number 12   Number of Visits 17   Date for PT Re-Evaluation 11-Jul-2016   Authorization Type g codes 04-22-2022   PT Start Time 1018   PT Stop Time 1100   PT Time Calculation (min) 42 min   Equipment Utilized During Treatment Gait belt   Activity Tolerance Patient tolerated treatment well   Behavior During Therapy WFL for tasks assessed/performed      Past Medical History:  Diagnosis Date  . A-fib ([place])   . CHF (congestive heart failure) ([place])   . COPD (chronic obstructive pulmonary disease) ([place])   . Difficult intubation   . Hyperlipidemia   . Hypertension   . Hypothyroidism   . Kidney disease   . Sleep apnea   . Stroke ([place])   . Thyroid disease     Past Surgical History:  Procedure Laterality Date  . CARDIAC CATHETERIZATION    . CATARACT EXTRACTION W/ INTRAOCULAR LENS  IMPLANT, BILATERAL    . COLONOSCOPY  2008  . HERNIA REPAIR     bilateral inguinal hernia/ [place]  . HERNIA REPAIR  02/02/2015   18 x 28 cm ventral light mesh placed laparoscopically.  . TONSILLECTOMY    . UMBILICAL HERNIA REPAIR N/A 02/02/2015   Procedure: HERNIA REPAIR UMBILICAL ADULT;  Surgeon: Robert Bellow, MD;  Location: [facility] ORS;  Service: General;  Laterality: N/A;  . VENTRAL HERNIA REPAIR N/A 02/02/2015   Procedure: LAPAROSCOPIC VENTRAL HERNIA;  Surgeon: Robert Bellow, MD;  Location: [facility] ORS;  Service: General;  Laterality: N/A;  . vp shunt placement  1979    There were no vitals filed for this visit.      Subjective Assessment - 05/31/16 1025    Subjective Patient reports he would like to work on his balance  today, patient states increased fatigue today secondary to walking here from getting his blood drawn.    Pertinent History Pt reports a history of rapid onset balance difficulty since 2014. He reports that the initial episode occurred due to improper use of diuretic with hypokalemia. One year later he had another episode with a fall. He was sent to ENT and had a VNG study which was normal. ENT also ordered a head CT and MRI. MRI showed chronic R MCA infarct. Pt comes for [employer] 2-3x/wk. He has completed pulmonary rehab at [facility] twice. Pt denies dizziness or vertigo but reports difficulty with his balance    Limitations Walking   Patient Stated Goals Improve balance so that he only needs cane or no assistive device   Currently in Pain? Yes   Pain Score 2    Pain Location Knee   Pain Orientation Right;Left   Pain Descriptors / Indicators Aching   Pain Type Chronic pain   Pain Onset 1 to 4 weeks ago   Pain Frequency Intermittent       TREATMENT Nustep on level 4 at seat level 13 - with cueing on speed of performance Side stepping up and over 6" step from 2 airex pads - x 10 up and over Step taps from airex pad onto 4" step - x  20 Leg Press with focus on improving quadriceps strength - 2 x 10 120#, x20 135#, 2 x 10 150# Heel/toe taps on half foam roller with UE support - 2 x 20  Forward/backward steps over half foam rollers with unilateral UE support -- x20       PT Education - 05/31/16 1035    Education provided Yes   Education Details Form and technique with stride length with ambulating   Person(s) Educated Patient   Methods Explanation;Demonstration   Comprehension Verbalized understanding;Returned demonstration             PT Long Term Goals - 05/24/16 1028      PT LONG TERM GOAL #1   Title Pt will be independent with HEP in order to improve strength and balance in order to decrease fall risk and improve function at home and work.   Time 8   Period Weeks   Status  On-going     PT LONG TERM GOAL #2   Title Pt will improve BERG to 48 points in order to demonstrate clinically significant improvement in balance.    Baseline 04/19/16: 38/56; 05/24/16: 44/56   Time 8   Period Weeks   Status On-going     PT LONG TERM GOAL #3   Title Pt will decrease TUG to below 25 seconds in order to demonstrate decreased fall risk and improved LE strength      Baseline 04/19/16: 36.15 seconds; 05/24/16: 20sec   Time 8   Period Weeks   Status Achieved     PT LONG TERM GOAL #4   Title Pt will increase 10MWT by at least 0.13 m/s in order to demonstrate clinically significant improvement in community ambulation.    Baseline 04/19/16: 0.75 m/s; 05/24/2016 .833   Time 8   Period Days   Status On-going               Plan - 05/31/16 1143    Clinical Impression Statement Patient demonstrates decreased endurance today versus previous visits indicating decreased muscular/cardiovascular endurance. Patient demonstrates increased difficulty with performing balancing activities demonstrating requirementof UE support to perform balancing activities and patient will benefit from further skilled therapy focused on improving balance with standing and walking to return to prior level of function.    Rehab Potential Good   Clinical Impairments Affecting Rehab Potential Positive: motivation; Negative: chronic balance dysfunction, chronic CVA   PT Frequency 2x / week   PT Duration 8 weeks   PT Treatment/Interventions ADLs/Self Care Home Management;Cryotherapy;Moist Heat;Neuromuscular re-education;Gait training;Functional mobility training;Stair training;Therapeutic activities;Therapeutic exercise;Balance training;Manual techniques;Aquatic Therapy;Canalith Repostioning;Electrical Stimulation;Traction;Ultrasound;DME Instruction;Patient/family education;Passive range of motion;Energy conservation;Vestibular   PT Next Visit Plan Progress HEP, continue LE strengthening and balance training   PT  Home Exercise Plan advanced- see patient instructions;    Consulted and Agree with Plan of Care Patient      Patient will benefit from skilled therapeutic intervention in order to improve the following deficits and impairments:  Abnormal gait, Decreased activity tolerance, Decreased balance, Decreased strength, Difficulty walking, Obesity, Decreased coordination  Visit Diagnosis: Muscle weakness (generalized)  Difficulty in walking, not elsewhere classified  Gait difficulty     Problem List Patient Active Problem List   Diagnosis Date Noted  . Cough 03/20/2015  . Bronchitis, chronic obstructive w acute bronchitis ([place]) 03/20/2015  . Umbilical hernia without obstruction and without gangrene 01/12/2015  . Ventral hernia without obstruction or gangrene 01/02/2015    Blythe Stanford, PT DPT 05/31/2016, 11:48 AM  [place]  [facility] MAIN [facility] SERVICES [place], [place], [address] Phone: [phone]   Fax:  [phone]  Name: Daniel Eaton. MRN: [id number] Date of Birth: 1940/05/04

## 2016-06-02 ENCOUNTER — Ambulatory Visit: Payer: PPO

## 2016-06-02 DIAGNOSIS — M6281 Muscle weakness (generalized): Secondary | ICD-10-CM

## 2016-06-02 DIAGNOSIS — R262 Difficulty in walking, not elsewhere classified: Secondary | ICD-10-CM

## 2016-06-02 NOTE — Therapy (Signed)
[place] MAIN [facility] SERVICES [address] [place], [place], [address] Phone: [phone]   Fax:  [phone]  Physical Therapy Treatment  Patient Details  Name: Daniel Eaton. MRN: [id number] Date of Birth: 12-05-1940 Referring Provider: Dr. Ouida Sills  Encounter Date: 06/02/2016      PT End of Session - 06/02/16 1033    Visit Number 13   Number of Visits 17   Date for PT Re-Evaluation 2016-06-26   Authorization Type g codes 2022/05/06   PT Start Time 1020   PT Stop Time 1100   PT Time Calculation (min) 40 min   Equipment Utilized During Treatment Gait belt   Activity Tolerance Patient tolerated treatment well   Behavior During Therapy WFL for tasks assessed/performed      Past Medical History:  Diagnosis Date  . A-fib ([place])   . CHF (congestive heart failure) ([place])   . COPD (chronic obstructive pulmonary disease) ([place])   . Difficult intubation   . Hyperlipidemia   . Hypertension   . Hypothyroidism   . Kidney disease   . Sleep apnea   . Stroke ([place])   . Thyroid disease     Past Surgical History:  Procedure Laterality Date  . CARDIAC CATHETERIZATION    . CATARACT EXTRACTION W/ INTRAOCULAR LENS  IMPLANT, BILATERAL    . COLONOSCOPY  2008  . HERNIA REPAIR     bilateral inguinal hernia/ [place]  . HERNIA REPAIR  02/02/2015   18 x 28 cm ventral light mesh placed laparoscopically.  . TONSILLECTOMY    . UMBILICAL HERNIA REPAIR N/A 02/02/2015   Procedure: HERNIA REPAIR UMBILICAL ADULT;  Surgeon: Robert Bellow, MD;  Location: [facility] ORS;  Service: General;  Laterality: N/A;  . VENTRAL HERNIA REPAIR N/A 02/02/2015   Procedure: LAPAROSCOPIC VENTRAL HERNIA;  Surgeon: Robert Bellow, MD;  Location: [facility] ORS;  Service: General;  Laterality: N/A;  . vp shunt placement  1979    There were no vitals filed for this visit.      Subjective Assessment - 06/02/16 1029    Subjective Patient reports he's 'feeling winded' today but feels it  is from his seasonal alllergies.   Pertinent History Pt reports a history of rapid onset balance difficulty since 2014. He reports that the initial episode occurred due to improper use of diuretic with hypokalemia. One year later he had another episode with a fall. He was sent to ENT and had a VNG study which was normal. ENT also ordered a head CT and MRI. MRI showed chronic R MCA infarct. Pt comes for [employer] 2-3x/wk. He has completed pulmonary rehab at [facility] twice. Pt denies dizziness or vertigo but reports difficulty with his balance    Limitations Walking   Patient Stated Goals Improve balance so that he only needs cane or no assistive device   Currently in Pain? Yes   Pain Score 4    Pain Location Knee  Lower back   Pain Orientation Right;Left   Pain Descriptors / Indicators T[place]   Pain Type Chronic pain   Pain Onset 1 to 4 weeks ago      TREATMENT Nustep on level 4 at seat level 13 - with cueing on speed of performance  Side stepping across airex beam-  down and back in  bars x10 Tandem amb on airex beams - down and back in  back x10 Bosu ball flat side down - balance 2 x 30sec; flat side up 2 x  30sec  Step ups onto Bosu ball - x5 each side  Leg Press with focus on improving quadriceps strength - x 20 165#, x10 180#       PT Education - 06/02/16 1033    Education provided Yes   Education Details form/technique with exercise   Person(s) Educated Patient   Methods Explanation;Demonstration   Comprehension Verbalized understanding;Returned demonstration             PT Long Term Goals - 05/24/16 1028      PT LONG TERM GOAL #1   Title Pt will be independent with HEP in order to improve strength and balance in order to decrease fall risk and improve function at home and work.   Time 8   Period Weeks   Status On-going     PT LONG TERM GOAL #2   Title Pt will improve BERG to 48 points in order to demonstrate clinically significant improvement in balance.     Baseline 04/19/16: 38/56; 05/24/16: 44/56   Time 8   Period Weeks   Status On-going     PT LONG TERM GOAL #3   Title Pt will decrease TUG to below 25 seconds in order to demonstrate decreased fall risk and improved LE strength      Baseline 04/19/16: 36.15 seconds; 05/24/16: 20sec   Time 8   Period Weeks   Status Achieved     PT LONG TERM GOAL #4   Title Pt will increase 10MWT by at least 0.13 m/s in order to demonstrate clinically significant improvement in community ambulation.    Baseline 04/19/16: 0.75 m/s; 05/24/2016 .833   Time 8   Period Days   Status On-going               Plan - 06/02/16 1045    Clinical Impression Statement Patient demonstrates improvement in standing tolerance today requiring less sitting rest breaks throughout the session indicating functional carryover between sessions. Patient demonstrates decreased balance indicated by increased postural sway with balancing activities. Patient will benefit from further skilled therapy to return to prior level of function.     Rehab Potential Good   Clinical Impairments Affecting Rehab Potential Positive: motivation; Negative: chronic balance dysfunction, chronic CVA   PT Frequency 2x / week   PT Duration 8 weeks   PT Treatment/Interventions ADLs/Self Care Home Management;Cryotherapy;Moist Heat;Neuromuscular re-education;Gait training;Functional mobility training;Stair training;Therapeutic activities;Therapeutic exercise;Balance training;Manual techniques;Aquatic Therapy;Canalith Repostioning;Electrical Stimulation;Traction;Ultrasound;DME Instruction;Patient/family education;Passive range of motion;Energy conservation;Vestibular   PT Next Visit Plan Progress HEP, continue LE strengthening and balance training   PT Home Exercise Plan advanced- see patient instructions;    Consulted and Agree with Plan of Care Patient      Patient will benefit from skilled therapeutic intervention in order to improve the following deficits  and impairments:  Abnormal gait, Decreased activity tolerance, Decreased balance, Decreased strength, Difficulty walking, Obesity, Decreased coordination  Visit Diagnosis: Muscle weakness (generalized)  Difficulty in walking, not elsewhere classified     Problem List Patient Active Problem List   Diagnosis Date Noted  . Cough 03/20/2015  . Bronchitis, chronic obstructive w acute bronchitis ([place]) 03/20/2015  . Umbilical hernia without obstruction and without gangrene 01/12/2015  . Ventral hernia without obstruction or gangrene 01/02/2015    Blythe Stanford, PT DPT 06/02/2016, 11:03 AM  [place] MAIN [facility] SERVICES [address] [place], [place], [address] Phone: [phone]   Fax:  [phone]  Name: Daniel Eaton. MRN: [id number] Date of Birth:  10/14/1940   

## 2016-06-06 DIAGNOSIS — M9904 Segmental and somatic dysfunction of sacral region: Secondary | ICD-10-CM | POA: Diagnosis not present

## 2016-06-06 DIAGNOSIS — M9902 Segmental and somatic dysfunction of thoracic region: Secondary | ICD-10-CM | POA: Diagnosis not present

## 2016-06-06 DIAGNOSIS — M9903 Segmental and somatic dysfunction of lumbar region: Secondary | ICD-10-CM | POA: Diagnosis not present

## 2016-06-07 ENCOUNTER — Ambulatory Visit: Payer: PPO | Admitting: Physical Therapy

## 2016-06-07 VITALS — BP 142/91 | HR 69

## 2016-06-07 DIAGNOSIS — R262 Difficulty in walking, not elsewhere classified: Secondary | ICD-10-CM

## 2016-06-07 DIAGNOSIS — N183 Chronic kidney disease, stage 3 (moderate): Secondary | ICD-10-CM | POA: Diagnosis not present

## 2016-06-07 DIAGNOSIS — E039 Hypothyroidism, unspecified: Secondary | ICD-10-CM | POA: Diagnosis not present

## 2016-06-07 DIAGNOSIS — I129 Hypertensive chronic kidney disease with stage 1 through stage 4 chronic kidney disease, or unspecified chronic kidney disease: Secondary | ICD-10-CM | POA: Diagnosis not present

## 2016-06-07 DIAGNOSIS — M6281 Muscle weakness (generalized): Secondary | ICD-10-CM | POA: Diagnosis not present

## 2016-06-07 DIAGNOSIS — R269 Unspecified abnormalities of gait and mobility: Secondary | ICD-10-CM

## 2016-06-07 DIAGNOSIS — G4733 Obstructive sleep apnea (adult) (pediatric): Secondary | ICD-10-CM | POA: Diagnosis not present

## 2016-06-07 DIAGNOSIS — Z6841 Body Mass Index (BMI) 40.0 and over, adult: Secondary | ICD-10-CM | POA: Diagnosis not present

## 2016-06-07 DIAGNOSIS — I482 Chronic atrial fibrillation: Secondary | ICD-10-CM | POA: Diagnosis not present

## 2016-06-07 DIAGNOSIS — J42 Unspecified chronic bronchitis: Secondary | ICD-10-CM | POA: Diagnosis not present

## 2016-06-07 DIAGNOSIS — I429 Cardiomyopathy, unspecified: Secondary | ICD-10-CM | POA: Diagnosis not present

## 2016-06-07 DIAGNOSIS — I700 Atherosclerosis of aorta: Secondary | ICD-10-CM | POA: Diagnosis not present

## 2016-06-07 NOTE — Therapy (Signed)
[place] MAIN [facility] SERVICES [address] [place], [place], [address] Phone: [phone]   Fax:  [phone]  Physical Therapy Treatment  Patient Details  Name: Daniel Eaton. MRN: [id number] Date of Birth: 1941-02-06 Referring Provider: Dr. Ouida Sills  Encounter Date: 06/07/2016      PT End of Session - 06/07/16 1011    Visit Number 14   Number of Visits 17   Date for PT Re-Evaluation 2016/06/30   Authorization Type g codes June 10, 2022   PT Start Time 1013   PT Stop Time 1058   PT Time Calculation (min) 45 min   Equipment Utilized During Treatment Gait belt   Activity Tolerance Patient tolerated treatment well   Behavior During Therapy WFL for tasks assessed/performed      Past Medical History:  Diagnosis Date  . A-fib ([place])   . CHF (congestive heart failure) ([place])   . COPD (chronic obstructive pulmonary disease) ([place])   . Difficult intubation   . Hyperlipidemia   . Hypertension   . Hypothyroidism   . Kidney disease   . Sleep apnea   . Stroke ([place])   . Thyroid disease     Past Surgical History:  Procedure Laterality Date  . CARDIAC CATHETERIZATION    . CATARACT EXTRACTION W/ INTRAOCULAR LENS  IMPLANT, BILATERAL    . COLONOSCOPY  2008  . HERNIA REPAIR     bilateral inguinal hernia/ [place]  . HERNIA REPAIR  02/02/2015   18 x 28 cm ventral light mesh placed laparoscopically.  . TONSILLECTOMY    . UMBILICAL HERNIA REPAIR N/A 02/02/2015   Procedure: HERNIA REPAIR UMBILICAL ADULT;  Surgeon: Robert Bellow, MD;  Location: [facility] ORS;  Service: General;  Laterality: N/A;  . VENTRAL HERNIA REPAIR N/A 02/02/2015   Procedure: LAPAROSCOPIC VENTRAL HERNIA;  Surgeon: Robert Bellow, MD;  Location: [facility] ORS;  Service: General;  Laterality: N/A;  . vp shunt placement  1979    Vitals:   06/07/16 1017  BP: (!) 142/91  Pulse: 69        Subjective Assessment - 06/07/16 1017    Subjective Pt denies any falls and reports he is  doing well this date.  Pt reports he is performing therapeutic exercise at home but not regularly.   Pertinent History Pt reports a history of rapid onset balance difficulty since 2014. He reports that the initial episode occurred due to improper use of diuretic with hypokalemia. One year later he had another episode with a fall. He was sent to ENT and had a VNG study which was normal. ENT also ordered a head CT and MRI. MRI showed chronic R MCA infarct. Pt comes for [employer] 2-3x/wk. He has completed pulmonary rehab at [facility] twice. Pt denies dizziness or vertigo but reports difficulty with his balance    Limitations Walking   Patient Stated Goals Improve balance so that he only needs cane or no assistive device   Pain Score 4    Pain Location Knee   Pain Orientation Left;Right   Pain Descriptors / Indicators Aching   Pain Type Chronic pain   Pain Onset More than a month ago   Multiple Pain Sites No        TREATMENT  Neuromuscular Re-ed:  Tandem amb on airex beams - down and back in  back x5 each direction  Bosu ball flat side down - balance 2 x 45sec; flat side up 2x45sec  Ambulating in // bars and alternating  stepping over airex and flat surface x8 lengths.    Therapeutic Exercise:  Marching in standing with RTB x20 each LE  Lateral step ups onto Bosu ball - x5 each side  Sit<>stand from chair without armrests. Cues to scoot to edge of chair and bend knees prior to attempting to stand and rocking forward X4. Pt performed second set of 6 with use of UEs on // bars due to fatigue.  Alternating toe taps up to 8" step, x20 each LE with intermittent hand hold on // bars.            PT Education - 06/07/16 1011    Education provided Yes   Education Details Exercise technique   Person(s) Educated Patient   Methods Explanation;Demonstration   Comprehension Verbalized understanding;Returned demonstration;Need further instruction             PT Long Term Goals - 05/24/16  1028      PT LONG TERM GOAL #1   Title Pt will be independent with HEP in order to improve strength and balance in order to decrease fall risk and improve function at home and work.   Time 8   Period Weeks   Status On-going     PT LONG TERM GOAL #2   Title Pt will improve BERG to 48 points in order to demonstrate clinically significant improvement in balance.    Baseline 04/19/16: 38/56; 05/24/16: 44/56   Time 8   Period Weeks   Status On-going     PT LONG TERM GOAL #3   Title Pt will decrease TUG to below 25 seconds in order to demonstrate decreased fall risk and improved LE strength      Baseline 04/19/16: 36.15 seconds; 05/24/16: 20sec   Time 8   Period Weeks   Status Achieved     PT LONG TERM GOAL #4   Title Pt will increase 10MWT by at least 0.13 m/s in order to demonstrate clinically significant improvement in community ambulation.    Baseline 04/19/16: 0.75 m/s; 05/24/2016 .833   Time 8   Period Days   Status On-going               Plan - 06/07/16 1048    Clinical Impression Statement Pt demonstrates poor quad strength, specifically notable with sit<>stand.  Provided cues for technique for ease with sit>stand and pt was able to perform x2 without UE support.  He demonstrates good eccentric control when sitting.  Pt demonstrates balance impairments, especially on uneven surfaces.  He is very motivated to return to [facility] and will benefit from continued skilled PT interventions for improved strength, balance, and QOL.   Rehab Potential Good   Clinical Impairments Affecting Rehab Potential Positive: motivation; Negative: chronic balance dysfunction, chronic CVA   PT Frequency 2x / week   PT Duration 8 weeks   PT Treatment/Interventions ADLs/Self Care Home Management;Cryotherapy;Moist Heat;Neuromuscular re-education;Gait training;Functional mobility training;Stair training;Therapeutic activities;Therapeutic exercise;Balance training;Manual techniques;Aquatic Therapy;Canalith  Repostioning;Electrical Stimulation;Traction;Ultrasound;DME Instruction;Patient/family education;Passive range of motion;Energy conservation;Vestibular   PT Next Visit Plan Progress HEP, continue LE strengthening and balance training   PT Home Exercise Plan advanced- see patient instructions;    Consulted and Agree with Plan of Care Patient      Patient will benefit from skilled therapeutic intervention in order to improve the following deficits and impairments:  Abnormal gait, Decreased activity tolerance, Decreased balance, Decreased strength, Difficulty walking, Obesity, Decreased coordination  Visit Diagnosis: Muscle weakness (generalized)  Difficulty in walking, not elsewhere classified  Gait difficulty  Muscle weakness     Problem List Patient Active Problem List   Diagnosis Date Noted  . Cough 03/20/2015  . Bronchitis, chronic obstructive w acute bronchitis ([place]) 03/20/2015  . Umbilical hernia without obstruction and without gangrene 01/12/2015  . Ventral hernia without obstruction or gangrene 01/02/2015     Collie Siad PT, DPT 06/07/2016, 11:00 AM  [place] MAIN [facility] SERVICES [address] [place], [place], [address] Phone: [phone]   Fax:  [phone]  Name: Laren Whaling. MRN: [id number] Date of Birth: Dec 05, 1940

## 2016-06-08 DIAGNOSIS — L814 Other melanin hyperpigmentation: Secondary | ICD-10-CM | POA: Diagnosis not present

## 2016-06-08 DIAGNOSIS — X32XXXA Exposure to sunlight, initial encounter: Secondary | ICD-10-CM | POA: Diagnosis not present

## 2016-06-08 DIAGNOSIS — L821 Other seborrheic keratosis: Secondary | ICD-10-CM | POA: Diagnosis not present

## 2016-06-08 DIAGNOSIS — D3614 Benign neoplasm of peripheral nerves and autonomic nervous system of thorax: Secondary | ICD-10-CM | POA: Diagnosis not present

## 2016-06-09 ENCOUNTER — Ambulatory Visit: Payer: PPO

## 2016-06-09 VITALS — BP 143/80 | HR 68

## 2016-06-09 DIAGNOSIS — M6281 Muscle weakness (generalized): Secondary | ICD-10-CM | POA: Diagnosis not present

## 2016-06-09 DIAGNOSIS — R262 Difficulty in walking, not elsewhere classified: Secondary | ICD-10-CM

## 2016-06-09 NOTE — Therapy (Signed)
[place] MAIN [facility] SERVICES [address] [place], [place], [address] Phone: [phone]   Fax:  [phone]  Physical Therapy Treatment  Patient Details  Name: Daniel Eaton. MRN: [id number] Date of Birth: [birth year] Referring Provider: Dr. Ouida Sills  Encounter Date: 06/09/2016      PT End of Session - 06/09/16 1030    Visit Number 15   Number of Visits 17   Date for PT Re-Evaluation 07-02-2016   Authorization Type g codes 5/10   PT Start Time 1026   PT Stop Time 1100   PT Time Calculation (min) 34 min   Equipment Utilized During Treatment Gait belt   Activity Tolerance Patient tolerated treatment well   Behavior During Therapy WFL for tasks assessed/performed      Past Medical History:  Diagnosis Date  . A-fib ([place])   . CHF (congestive heart failure) ([place])   . COPD (chronic obstructive pulmonary disease) ([place])   . Difficult intubation   . Hyperlipidemia   . Hypertension   . Hypothyroidism   . Kidney disease   . Sleep apnea   . Stroke ([place])   . Thyroid disease     Past Surgical History:  Procedure Laterality Date  . CARDIAC CATHETERIZATION    . CATARACT EXTRACTION W/ INTRAOCULAR LENS  IMPLANT, BILATERAL    . COLONOSCOPY  2008  . HERNIA REPAIR     bilateral inguinal hernia/ [place]  . HERNIA REPAIR  02/02/2015   18 x 28 cm ventral light mesh placed laparoscopically.  . TONSILLECTOMY    . UMBILICAL HERNIA REPAIR N/A 02/02/2015   Procedure: HERNIA REPAIR UMBILICAL ADULT;  Surgeon: Robert Bellow, MD;  Location: [facility] ORS;  Service: General;  Laterality: N/A;  . VENTRAL HERNIA REPAIR N/A 02/02/2015   Procedure: LAPAROSCOPIC VENTRAL HERNIA;  Surgeon: Robert Bellow, MD;  Location: [facility] ORS;  Service: General;  Laterality: N/A;  . vp shunt placement  1979    Vitals:   06/09/16 1031  BP: (!) 143/80  Pulse: 68  SpO2: 98%        Subjective Assessment - 06/09/16 1025    Subjective Pt reports that he is very  sore today due to exercise session on Tuesday. No falls since last session. Denies any true pain today. No specific questions or concerns currently.    Pertinent History Pt reports a history of rapid onset balance difficulty since 2014. He reports that the initial episode occurred due to improper use of diuretic with hypokalemia. One year later he had another episode with a fall. He was sent to ENT and had a VNG study which was normal. ENT also ordered a head CT and MRI. MRI showed chronic R MCA infarct. Pt comes for [employer] 2-3x/wk. He has completed pulmonary rehab at [facility] twice. Pt denies dizziness or vertigo but reports difficulty with his balance    Limitations Walking   Patient Stated Goals Improve balance so that he only needs cane or no assistive device   Currently in Pain? No/denies         TREATMENT  Therapeutic Exercise:  NuStep L5 x 5 minutes, intermittent monitoring of vitals (remain WNL throughout), assessing fatigue and speed throughout; Marching in standing with RTB resistance at knees x 20 bilateral; Standing RTB resisted hip abduction and extension x 20 bilateral; Seated clams with RTB x 20  Neuromuscular Re-ed:  Alternating toe taps up to 8" step, x20 each LE with intermittent hand hold on // bars.  Side stepping in // bars on Airex balance beams, 4 lengths; Discussed the importance of HEP, especially standing exercises.      .                            PT Education - 06/09/16 1030    Education provided Yes   Education Details Importance of HEP   Person(s) Educated Patient   Methods Explanation   Comprehension Verbalized understanding             PT Long Term Goals - 05/24/16 1028      PT LONG TERM GOAL #1   Title Pt will be independent with HEP in order to improve strength and balance in order to decrease fall risk and improve function at home and work.   Time 8   Period Weeks   Status On-going     PT LONG TERM GOAL  #2   Title Pt will improve BERG to 48 points in order to demonstrate clinically significant improvement in balance.    Baseline 04/19/16: 38/56; 05/24/16: 44/56   Time 8   Period Weeks   Status On-going     PT LONG TERM GOAL #3   Title Pt will decrease TUG to below 25 seconds in order to demonstrate decreased fall risk and improved LE strength      Baseline 04/19/16: 36.15 seconds; 05/24/16: 20sec   Time 8   Period Weeks   Status Achieved     PT LONG TERM GOAL #4   Title Pt will increase 10MWT by at least 0.13 m/s in order to demonstrate clinically significant improvement in community ambulation.    Baseline 04/19/16: 0.75 m/s; 05/24/2016 .833   Time 8   Period Days   Status On-going               Plan - 06/09/16 1044    Clinical Impression Statement Pt arrived late for his appointment today so session was abbreviated accordingly. He reports increased soreness in quads bilaterally today from session on Tuesday so focused on exercises to strength hips today as well as balance activities. Encouraged pt to perform HEP, especially standing exercises. Follow-up as scheduled.    Rehab Potential Good   Clinical Impairments Affecting Rehab Potential Positive: motivation; Negative: chronic balance dysfunction, chronic CVA   PT Frequency 2x / week   PT Duration 8 weeks   PT Treatment/Interventions ADLs/Self Care Home Management;Cryotherapy;Moist Heat;Neuromuscular re-education;Gait training;Functional mobility training;Stair training;Therapeutic activities;Therapeutic exercise;Balance training;Manual techniques;Aquatic Therapy;Canalith Repostioning;Electrical Stimulation;Traction;Ultrasound;DME Instruction;Patient/family education;Passive range of motion;Energy conservation;Vestibular   PT Next Visit Plan Progress HEP, continue LE strengthening and balance training   PT Home Exercise Plan advanced- see patient instructions;    Consulted and Agree with Plan of Care Patient      Patient will  benefit from skilled therapeutic intervention in order to improve the following deficits and impairments:  Abnormal gait, Decreased activity tolerance, Decreased balance, Decreased strength, Difficulty walking, Obesity, Decreased coordination  Visit Diagnosis: Muscle weakness (generalized)  Difficulty in walking, not elsewhere classified     Problem List Patient Active Problem List   Diagnosis Date Noted  . Cough 03/20/2015  . Bronchitis, chronic obstructive w acute bronchitis ([place]) 03/20/2015  . Umbilical hernia without obstruction and without gangrene 01/12/2015  . Ventral hernia without obstruction or gangrene 01/02/2015   Phillips Grout PT, DPT   Huprich,Jason 06/09/2016, 2:21 PM  [place] MAIN [facility] SERVICES [address]  [place], [place], [address] Phone: [phone]   Fax:  [phone]  Name: Ghali Morissette. MRN: [id number] Date of Birth: [birth year]

## 2016-06-10 DIAGNOSIS — M79675 Pain in left toe(s): Secondary | ICD-10-CM | POA: Diagnosis not present

## 2016-06-10 DIAGNOSIS — M79674 Pain in right toe(s): Secondary | ICD-10-CM | POA: Diagnosis not present

## 2016-06-10 DIAGNOSIS — B351 Tinea unguium: Secondary | ICD-10-CM | POA: Diagnosis not present

## 2016-06-14 ENCOUNTER — Ambulatory Visit: Payer: PPO | Attending: Internal Medicine

## 2016-06-14 VITALS — BP 135/77 | HR 82

## 2016-06-14 DIAGNOSIS — R269 Unspecified abnormalities of gait and mobility: Secondary | ICD-10-CM | POA: Insufficient documentation

## 2016-06-14 DIAGNOSIS — M6281 Muscle weakness (generalized): Secondary | ICD-10-CM | POA: Diagnosis not present

## 2016-06-14 DIAGNOSIS — R262 Difficulty in walking, not elsewhere classified: Secondary | ICD-10-CM | POA: Insufficient documentation

## 2016-06-14 NOTE — Therapy (Signed)
[place] MAIN [facility] SERVICES [address] [place], [place], [address] Phone: [phone]   Fax:  [phone]  Physical Therapy Treatment  Patient Details  Name: Daniel Eaton. MRN: [id number] Date of Birth: [birth year] Referring Provider: Dr. Ouida Sills  Encounter Date: 2016-06-24      PT End of Session - 06-24-16 1045    Visit Number 16   Number of Visits 17   Date for PT Re-Evaluation 06/24/2016   Authorization Type g codes 6/10   PT Start Time 1040   PT Stop Time 1115   PT Time Calculation (min) 35 min   Equipment Utilized During Treatment Gait belt   Activity Tolerance Patient tolerated treatment well   Behavior During Therapy WFL for tasks assessed/performed      Past Medical History:  Diagnosis Date  . A-fib ([place])   . CHF (congestive heart failure) ([place])   . COPD (chronic obstructive pulmonary disease) ([place])   . Difficult intubation   . Hyperlipidemia   . Hypertension   . Hypothyroidism   . Kidney disease   . Sleep apnea   . Stroke ([place])   . Thyroid disease     Past Surgical History:  Procedure Laterality Date  . CARDIAC CATHETERIZATION    . CATARACT EXTRACTION W/ INTRAOCULAR LENS  IMPLANT, BILATERAL    . COLONOSCOPY  2008  . HERNIA REPAIR     bilateral inguinal hernia/ [place]  . HERNIA REPAIR  02/02/2015   18 x 28 cm ventral light mesh placed laparoscopically.  . TONSILLECTOMY    . UMBILICAL HERNIA REPAIR N/A 02/02/2015   Procedure: HERNIA REPAIR UMBILICAL ADULT;  Surgeon: Robert Bellow, MD;  Location: [facility] ORS;  Service: General;  Laterality: N/A;  . VENTRAL HERNIA REPAIR N/A 02/02/2015   Procedure: LAPAROSCOPIC VENTRAL HERNIA;  Surgeon: Robert Bellow, MD;  Location: [facility] ORS;  Service: General;  Laterality: N/A;  . vp shunt placement  1979    Vitals:   Jun 24, 2016 1044  BP: 135/77  Pulse: 82  SpO2: 98%        Subjective Assessment - 24-Jun-2016 1044    Subjective Pt is feeling very fatigued today.  He states that he has been struggling with his shortness of breath. He states that he spent some increased time on the seated elliptical yesterday and his legs are tired.    Pertinent History Pt reports a history of rapid onset balance difficulty since 2014. He reports that the initial episode occurred due to improper use of diuretic with hypokalemia. One year later he had another episode with a fall. He was sent to ENT and had a VNG study which was normal. ENT also ordered a head CT and MRI. MRI showed chronic R MCA infarct. Pt comes for [employer] 2-3x/wk. He has completed pulmonary rehab at [facility] twice. Pt denies dizziness or vertigo but reports difficulty with his balance    Limitations Walking   Patient Stated Goals Improve balance so that he only needs cane or no assistive device   Currently in Pain? No/denies  Reports quad soreness                TREATMENT  Neuromuscular Re-ed:  Discussion with patient regarding his goals for therapy and his current frustration regarding feeling fatigued, SOB, and weak today; Side stepping in // bars x 4 lengths; Side stepping in // bars on Airex balance beam x 4 lengths; Airex balance with feet apart and then together x 30s  each; Airex balance with alternating toe taps up to 6" step alternating LE with intermittent hand hold on // bars.  Rockerboard balance x 30s in R/L direction; Rockerboard balance with dynamic reaching to playing cards with therapist calling out sequence and laterality x multiple bouts on each side; Discussed the importance of rest between sessions.                  PT Education - 06/14/16 1044    Education provided Yes   Education Details Plan of care, scheduling rest break the day before PT   Person(s) Educated Patient   Methods Explanation   Comprehension Verbalized understanding             PT Long Term Goals - 06/14/16 1045      PT LONG TERM GOAL #1   Title Pt will be independent with HEP  in order to improve strength and balance in order to decrease fall risk and improve function at home and work.   Time 8   Period Weeks   Status On-going     PT LONG TERM GOAL #2   Title Pt will improve BERG to 48 points in order to demonstrate clinically significant improvement in balance.    Baseline 04/19/16: 38/56; 05/24/16: 44/56   Time 8   Period Weeks   Status On-going     PT LONG TERM GOAL #3   Title Pt will decrease TUG to below 25 seconds in order to demonstrate decreased fall risk and improved LE strength      Baseline 04/19/16: 36.15 seconds; 05/24/16: 20sec   Time 8   Period Weeks   Status Achieved     PT LONG TERM GOAL #4   Title Pt will increase 10MWT by at least 0.13 m/s in order to demonstrate clinically significant improvement in community ambulation.    Baseline 04/19/16: 0.75 m/s; 05/24/2016 .833   Time 8   Period Days   Status On-going               Plan - 06/14/16 1045    Clinical Impression Statement Pt arrived late for his appointment agian today so it was abbreviated due to scheduling conflicts. He is due to update outcome measures today however he is so fatigued that they were deferred as it would not be an accurate reflection of his current state. Pt requires multiple rest breaks during session due to fatigue. He reports that he is staying well hydrated and denies any other chnages in his health such as blood in urine/stool, chills, fevers, night seates or dizziness. Pt encouraged to continue HEP. Instructed to give himself a rest day for recovery between workout days. Follow-up as scheduled. Will perform outcome measures, update goals, and recertify him at his next visit as appropriate.    Rehab Potential Good   Clinical Impairments Affecting Rehab Potential Positive: motivation; Negative: chronic balance dysfunction, chronic CVA   PT Frequency 2x / week   PT Duration 8 weeks   PT Treatment/Interventions ADLs/Self Care Home Management;Cryotherapy;Moist  Heat;Neuromuscular re-education;Gait training;Functional mobility training;Stair training;Therapeutic activities;Therapeutic exercise;Balance training;Manual techniques;Aquatic Therapy;Canalith Repostioning;Electrical Stimulation;Traction;Ultrasound;DME Instruction;Patient/family education;Passive range of motion;Energy conservation;Vestibular   PT Next Visit Plan Outcome meausures, update goals, recert as appropriate, continue LE strengthening and balance training   PT Home Exercise Plan As prescribed   Consulted and Agree with Plan of Care Patient      Patient will benefit from skilled therapeutic intervention in order to improve the following deficits and impairments:  Abnormal  gait, Decreased activity tolerance, Decreased balance, Decreased strength, Difficulty walking, Obesity, Decreased coordination  Visit Diagnosis: Difficulty in walking, not elsewhere classified     Problem List Patient Active Problem List   Diagnosis Date Noted  . Cough 03/20/2015  . Bronchitis, chronic obstructive w acute bronchitis ([place]) 03/20/2015  . Umbilical hernia without obstruction and without gangrene 01/12/2015  . Ventral hernia without obstruction or gangrene 01/02/2015   Phillips Grout PT, DPT   Huprich,Jason 06/14/2016, 11:39 AM  [place] MAIN [facility] SERVICES [address] [place], [place], [address] Phone: [phone]   Fax:  [phone]  Name: Daniel Eaton. MRN: [id number] Date of Birth: 08-30-1940

## 2016-06-16 ENCOUNTER — Ambulatory Visit: Payer: PPO

## 2016-06-16 VITALS — BP 140/85 | HR 82

## 2016-06-16 DIAGNOSIS — R262 Difficulty in walking, not elsewhere classified: Secondary | ICD-10-CM

## 2016-06-16 DIAGNOSIS — M6281 Muscle weakness (generalized): Secondary | ICD-10-CM

## 2016-06-16 NOTE — Therapy (Signed)
[place] MAIN [facility] SERVICES [address] [place], [place], [address] Phone: [phone]   Fax:  [phone]  Physical Therapy Treatment/Progress Note  Patient Details  Name: Daniel Eaton. MRN: [id number] Date of Birth: 07-20-1940 Referring Provider: Dr. Ouida Sills  Encounter Date: 06/16/2016      PT End of Session - 06/16/16 1152    Visit Number 17   Number of Visits 33   Date for PT Re-Evaluation 09/06/16   Authorization Type g codes 7/10   PT Start Time 06-09-38   PT Stop Time 1125   PT Time Calculation (min) 45 min   Equipment Utilized During Treatment Gait belt   Activity Tolerance Patient tolerated treatment well   Behavior During Therapy WFL for tasks assessed/performed      Past Medical History:  Diagnosis Date  . A-fib ([place])   . CHF (congestive heart failure) ([place])   . COPD (chronic obstructive pulmonary disease) ([place])   . Difficult intubation   . Hyperlipidemia   . Hypertension   . Hypothyroidism   . Kidney disease   . Sleep apnea   . Stroke ([place])   . Thyroid disease     Past Surgical History:  Procedure Laterality Date  . CARDIAC CATHETERIZATION    . CATARACT EXTRACTION W/ INTRAOCULAR LENS  IMPLANT, BILATERAL    . COLONOSCOPY  06/09/2006  . HERNIA REPAIR     bilateral inguinal hernia/ [place]  . HERNIA REPAIR  02/02/2015   18 x 28 cm ventral light mesh placed laparoscopically.  . TONSILLECTOMY    . UMBILICAL HERNIA REPAIR N/A 02/02/2015   Procedure: HERNIA REPAIR UMBILICAL ADULT;  Surgeon: Robert Bellow, MD;  Location: [facility] ORS;  Service: General;  Laterality: N/A;  . VENTRAL HERNIA REPAIR N/A 02/02/2015   Procedure: LAPAROSCOPIC VENTRAL HERNIA;  Surgeon: Robert Bellow, MD;  Location: [facility] ORS;  Service: General;  Laterality: N/A;  . vp shunt placement  1979    Vitals:   06/16/16 1045  BP: 140/85  Pulse: 82  SpO2: 96%        Subjective Assessment - 06/16/16 1040    Subjective Pt reports he is  doing well today. He took a rest day yesterday and feels better today. No specific questions or concerns currently.    Pertinent History Pt reports a history of rapid onset balance difficulty since 06-08-2012. He reports that the initial episode occurred due to improper use of diuretic with hypokalemia. One year later he had another episode with a fall. He was sent to ENT and had a VNG study which was normal. ENT also ordered a head CT and MRI. MRI showed chronic R MCA infarct. Pt comes for [employer] 2-3x/wk. He has completed pulmonary rehab at [facility] twice. Pt denies dizziness or vertigo but reports difficulty with his balance    Limitations Walking   Patient Stated Goals Improve balance so that he only needs cane or no assistive device   Currently in Pain? No/denies            [facility] PT Assessment - 06/16/16 1045      Standardized Balance Assessment   Standardized Balance Assessment Berg Balance Test;Timed Up and Go Test;10 meter walk test   10 Meter Walk 11.4s = 0.88 m/s     Berg Balance Test   Sit to Stand Able to stand without using hands and stabilize independently   Standing Unsupported Able to stand safely 2 minutes   Sitting with Back  Unsupported but Feet Supported on Floor or Stool Able to sit safely and securely 2 minutes   Stand to Sit Sits safely with minimal use of hands   Transfers Able to transfer safely, definite need of hands   Standing Unsupported with Eyes Closed Able to stand 10 seconds with supervision   Standing Ubsupported with Feet Together Able to place feet together independently and stand 1 minute safely   From Standing, Reach Forward with Outstretched Arm Can reach forward >12 cm safely (5")   From Standing Position, Pick up Object from Floor Able to pick up shoe, needs supervision   From Standing Position, Turn to Look Behind Over each Shoulder Looks behind from both sides and weight shifts well   Turn 360 Degrees Able to turn 360 degrees safely but slowly    Standing Unsupported, Alternately Place Feet on Step/Stool Able to complete 4 steps without aid or supervision   Standing Unsupported, One Foot in Front Able to take small step independently and hold 30 seconds   Standing on One Leg Tries to lift leg/unable to hold 3 seconds but remains standing independently   Total Score 43     Timed Up and Go Test   TUG Normal TUG   Normal TUG (seconds) 22.07          TREATMENT  Neuromuscular Re-ed:  Completed outcome measures with patient including TUG, 10MWT, and BERG; Updated goals and discussed plan of care; Side stepping in // bars on Airex balancebeam x 6 lengths; Airex balance with feet apart x 30s each; Airex balance with feet apart, horizontal and vertical head turns x 30s each; Airex balance with horizontal ball passes around body to therapist, varying height each time;  Ther-ex Seated marches with manual resistance 2 x 15; Seated hip abduction with manual resistance 2 x 15; Seated hip adduction with manual resistance 2 x 15; Seated heel raises with manual resistance 2 x 15;   Verbal and tactile cues from therapist for technique during balance and strengthening. CGA/MinA+1 required for safety in parallel bars with balance exercises.               PT Education - 06/16/16 1040    Education provided Yes   Education Details Goals, plan of care   Person(s) Educated Patient   Methods Explanation   Comprehension Verbalized understanding             PT Long Term Goals - 06/16/16 1041      PT LONG TERM GOAL #1   Title Pt will be independent with HEP in order to improve strength and balance in order to decrease fall risk and improve function at home and work.   Time 8   Period Weeks   Status On-going     PT LONG TERM GOAL #2   Title Pt will improve BERG to 48 points in order to demonstrate clinically significant improvement in balance.    Baseline 04/19/16: 38/56; 05/24/16: 44/56 06/16/16: 43/56   Time 8   Period  Weeks   Status On-going     PT LONG TERM GOAL #3   Title Pt will decrease TUG to below 25 seconds in order to demonstrate decreased fall risk and improved LE strength      Baseline 04/19/16: 36.15 seconds; 05/24/16: 20sec; 06/16/16: 22.07 seconds   Time 8   Period Weeks   Status Achieved     PT LONG TERM GOAL #4   Title Pt will increase 10MWT by at least  0.13 m/s in order to demonstrate clinically significant improvement in community ambulation.    Baseline 04/19/16: 0.75 m/s; 05/24/2016 .833; 06/16/16: 11.4s = 0.88 m/s   Time 8   Period Days   Status On-going     PT LONG TERM GOAL #5   Title Pt will decrease TUG to below 20 seconds in order to demonstrate decreased fall risk and improved LE strength      Baseline 04/19/16: 36.15 seconds; 05/24/16: 20sec; 06/16/16: 22.07 seconds   Status New               Plan - 06/16/16 1040    Clinical Impression Statement Pt is making good progress with physical therapy at this time. His BERG improved from 38/56 to 43/56 and his TUG decreased from 36.15s to 22.07s. His 22m gait speed improved from 0.75 m/s to 0.88 m/s. He reports compliance with HEP. Pt making steady progress with physical therapy and will benefit from continued PT services to further improve his balance and strength.    Rehab Potential Good   Clinical Impairments Affecting Rehab Potential Positive: motivation; Negative: chronic balance dysfunction, chronic CVA   PT Frequency 2x / week   PT Duration 8 weeks   PT Treatment/Interventions ADLs/Self Care Home Management;Cryotherapy;Moist Heat;Neuromuscular re-education;Gait training;Functional mobility training;Stair training;Therapeutic activities;Therapeutic exercise;Balance training;Manual techniques;Aquatic Therapy;Canalith Repostioning;Electrical Stimulation;Traction;Ultrasound;DME Instruction;Patient/family education;Passive range of motion;Energy conservation;Vestibular   PT Next Visit Plan Outcome meausures, update goals, recert as  appropriate, continue LE strengthening and balance training   PT Home Exercise Plan As prescribed   Consulted and Agree with Plan of Care Patient      Patient will benefit from skilled therapeutic intervention in order to improve the following deficits and impairments:  Abnormal gait, Decreased activity tolerance, Decreased balance, Decreased strength, Difficulty walking, Obesity, Decreased coordination  Visit Diagnosis: Difficulty in walking, not elsewhere classified - Plan: PT plan of care cert/re-cert  Muscle weakness (generalized) - Plan: PT plan of care cert/re-cert     Problem List Patient Active Problem List   Diagnosis Date Noted  . Cough 03/20/2015  . Bronchitis, chronic obstructive w acute bronchitis ([place]) 03/20/2015  . Umbilical hernia without obstruction and without gangrene 01/12/2015  . Ventral hernia without obstruction or gangrene 01/02/2015   Phillips Grout PT, DPT   Huprich,Jason 06/16/2016, 12:06 PM  [place] MAIN [facility] SERVICES [address] [place], [place], [address] Phone: [phone]   Fax:  [phone]  Name: Daniel Eaton. MRN: [id number] Date of Birth: 01/26/1941

## 2016-06-21 ENCOUNTER — Ambulatory Visit: Payer: PPO

## 2016-06-27 DIAGNOSIS — M9902 Segmental and somatic dysfunction of thoracic region: Secondary | ICD-10-CM | POA: Diagnosis not present

## 2016-06-27 DIAGNOSIS — M9903 Segmental and somatic dysfunction of lumbar region: Secondary | ICD-10-CM | POA: Diagnosis not present

## 2016-06-27 DIAGNOSIS — M9904 Segmental and somatic dysfunction of sacral region: Secondary | ICD-10-CM | POA: Diagnosis not present

## 2016-06-27 DIAGNOSIS — M9908 Segmental and somatic dysfunction of rib cage: Secondary | ICD-10-CM | POA: Diagnosis not present

## 2016-06-28 ENCOUNTER — Ambulatory Visit: Payer: PPO

## 2016-06-30 ENCOUNTER — Ambulatory Visit: Payer: PPO | Admitting: Physical Therapy

## 2016-06-30 VITALS — BP 140/93

## 2016-06-30 DIAGNOSIS — R262 Difficulty in walking, not elsewhere classified: Secondary | ICD-10-CM

## 2016-06-30 DIAGNOSIS — R269 Unspecified abnormalities of gait and mobility: Secondary | ICD-10-CM

## 2016-06-30 DIAGNOSIS — M6281 Muscle weakness (generalized): Secondary | ICD-10-CM

## 2016-06-30 NOTE — Therapy (Signed)
[place] MAIN [facility] SERVICES [address] [place], [place], [address] Phone: [phone]   Fax:  [phone]  Physical Therapy Treatment  Patient Details  Name: Daniel Eaton. MRN: [id number] Date of Birth: 07-28-1940 Referring Provider: Dr. Ouida Sills  Encounter Date: 06/30/2016      PT End of Session - 06/30/16 1030    Visit Number 18   Number of Visits 33   Date for PT Re-Evaluation 09-06-16   Authorization Type g codes 8/10   PT Start Time 08-Jun-1028   PT Stop Time 06-09-07   PT Time Calculation (min) 39 min   Equipment Utilized During Treatment Gait belt   Activity Tolerance Patient tolerated treatment well   Behavior During Therapy WFL for tasks assessed/performed      Past Medical History:  Diagnosis Date  . A-fib ([place])   . CHF (congestive heart failure) ([place])   . COPD (chronic obstructive pulmonary disease) ([place])   . Difficult intubation   . Hyperlipidemia   . Hypertension   . Hypothyroidism   . Kidney disease   . Sleep apnea   . Stroke ([place])   . Thyroid disease     Past Surgical History:  Procedure Laterality Date  . CARDIAC CATHETERIZATION    . CATARACT EXTRACTION W/ INTRAOCULAR LENS  IMPLANT, BILATERAL    . COLONOSCOPY  06-09-2006  . HERNIA REPAIR     bilateral inguinal hernia/ [facility]  . HERNIA REPAIR  02/02/2015   18 x 28 cm ventral light mesh placed laparoscopically.  . TONSILLECTOMY    . UMBILICAL HERNIA REPAIR N/A 02/02/2015   Procedure: HERNIA REPAIR UMBILICAL ADULT;  Surgeon: Robert Bellow, MD;  Location: [facility] ORS;  Service: General;  Laterality: N/A;  . VENTRAL HERNIA REPAIR N/A 02/02/2015   Procedure: LAPAROSCOPIC VENTRAL HERNIA;  Surgeon: Robert Bellow, MD;  Location: [facility] ORS;  Service: General;  Laterality: N/A;  . vp shunt placement  1979    Vitals:   06/30/16 1038  BP: (!) 140/93        Subjective Assessment - 06/30/16 1038    Subjective Pt reports he is feeling better today, has been sick  at home but only residual symptom is some SOB but pt has been monitor SpO2 at home which has not dropped below 93%.  No other concerns or complaints at this time.   Pertinent History Pt reports a history of rapid onset balance difficulty since 2012/06/08. He reports that the initial episode occurred due to improper use of diuretic with hypokalemia. One year later he had another episode with a fall. He was sent to ENT and had a VNG study which was normal. ENT also ordered a head CT and MRI. MRI showed chronic R MCA infarct. Pt comes for [employer] 2-3x/wk. He has completed pulmonary rehab at [facility] twice. Pt denies dizziness or vertigo but reports difficulty with his balance    Limitations Walking   Patient Stated Goals Improve balance so that he only needs cane or no assistive device   Currently in Pain? No/denies      Neuromuscular Re-ed:  Stepping from airex to another airex leading with L foot x10 and then R foot x10, each time completely accepting weight to SLS.   Tandem amb on airex beam - down and back in  back x3 each direction   Bosu ball balancing with flat side up 2x45sec. Pt reports his LEs are sore after this and says, "I don't know that my  legs will ever be the same after doing this exercise" and says "it's going to take me two days to recover from this exercise."   Ambulating in // bars and alternating stepping over airex and flat surface x8 lengths.   Rhomberg stance on airex 2x26minute   Balancing on airex with vertical and horizontal head turns x30 seconds   Pt requires intermittent support>1HHA on // bars with all balance exercises with significant fear of falling.      Therapeutic Exercise:  NuStep level 15 x3 minutes with cues to push quickly and with power with BLEs. Fatigue toward end.   Marching in standing with RTB x20 each LE       PT Education - 06/30/16 1030    Education provided Yes   Education Details Exercise technique   Person(s) Educated Patient    Methods Explanation;Demonstration;Verbal cues   Comprehension Verbalized understanding;Returned demonstration;Verbal cues required;Need further instruction             PT Long Term Goals - 06/16/16 1041      PT LONG TERM GOAL #1   Title Pt will be independent with HEP in order to improve strength and balance in order to decrease fall risk and improve function at home and work.   Time 8   Period Weeks   Status On-going     PT LONG TERM GOAL #2   Title Pt will improve BERG to 48 points in order to demonstrate clinically significant improvement in balance.    Baseline 04/19/16: 38/56; 05/24/16: 44/56 06/16/16: 43/56   Time 8   Period Weeks   Status On-going     PT LONG TERM GOAL #3   Title Pt will decrease TUG to below 25 seconds in order to demonstrate decreased fall risk and improved LE strength      Baseline 04/19/16: 36.15 seconds; 05/24/16: 20sec; 06/16/16: 22.07 seconds   Time 8   Period Weeks   Status Achieved     PT LONG TERM GOAL #4   Title Pt will increase 10MWT by at least 0.13 m/s in order to demonstrate clinically significant improvement in community ambulation.    Baseline 04/19/16: 0.75 m/s; 05/24/2016 .833; 06/16/16: 11.4s = 0.88 m/s   Time 8   Period Days   Status On-going     PT LONG TERM GOAL #5   Title Pt will decrease TUG to below 20 seconds in order to demonstrate decreased fall risk and improved LE strength      Baseline 04/19/16: 36.15 seconds; 05/24/16: 20sec; 06/16/16: 22.07 seconds   Status New               Plan - 06/30/16 1050    Clinical Impression Statement Pt presents after being sick and with increased SOB and fatigue this session.  SpO2 monitored throughout session with lowest reading 95% on room air.  He requires frequent rest breaks due to fatigue.  Pt is hesitant to challenge himself with balance exercises by releasing grip on // bars due to fear of falling.  He tolerated all interventions and will benefit from continued skilled PT interventions for  improved endurance, strength, and balance.   Rehab Potential Good   Clinical Impairments Affecting Rehab Potential Positive: motivation; Negative: chronic balance dysfunction, chronic CVA   PT Frequency 2x / week   PT Duration 8 weeks   PT Treatment/Interventions ADLs/Self Care Home Management;Cryotherapy;Moist Heat;Neuromuscular re-education;Gait training;Functional mobility training;Stair training;Therapeutic activities;Therapeutic exercise;Balance training;Manual techniques;Aquatic Therapy;Canalith Repostioning;Electrical Stimulation;Traction;Ultrasound;DME Instruction;Patient/family education;Passive range of motion;Energy conservation;Vestibular  PT Next Visit Plan Outcome meausures, update goals, recert as appropriate, continue LE strengthening and balance training   PT Home Exercise Plan As prescribed   Consulted and Agree with Plan of Care Patient      Patient will benefit from skilled therapeutic intervention in order to improve the following deficits and impairments:  Abnormal gait, Decreased activity tolerance, Decreased balance, Decreased strength, Difficulty walking, Obesity, Decreased coordination  Visit Diagnosis: Difficulty in walking, not elsewhere classified  Muscle weakness (generalized)  Gait difficulty  Muscle weakness     Problem List Patient Active Problem List   Diagnosis Date Noted  . Cough 03/20/2015  . Bronchitis, chronic obstructive w acute bronchitis ([place]) 03/20/2015  . Umbilical hernia without obstruction and without gangrene 01/12/2015  . Ventral hernia without obstruction or gangrene 01/02/2015     Collie Siad PT, DPT 06/30/2016, 11:20 AM  [place] MAIN [facility] SERVICES [address] [place], [place], [address] Phone: [phone]   Fax:  [phone]  Name: Vick Filter. MRN: [id number] Date of Birth: [birth year]

## 2016-07-05 ENCOUNTER — Ambulatory Visit: Payer: PPO

## 2016-07-05 DIAGNOSIS — R262 Difficulty in walking, not elsewhere classified: Secondary | ICD-10-CM

## 2016-07-05 DIAGNOSIS — R269 Unspecified abnormalities of gait and mobility: Secondary | ICD-10-CM

## 2016-07-05 DIAGNOSIS — M6281 Muscle weakness (generalized): Secondary | ICD-10-CM

## 2016-07-05 NOTE — Therapy (Signed)
[place] MAIN [facility] SERVICES [address] [place], [place], [address] Phone: [phone]   Fax:  [phone]  Physical Therapy Treatment  Patient Details  Name: Daniel Eaton. MRN: [id number] Date of Birth: July 20, 1940 Referring Provider: Dr. Ouida Sills  Encounter Date: 07/05/2016      PT End of Session - 07/05/16 1052    Visit Number 19   Number of Visits 33   Date for PT Re-Evaluation Aug 21, 2016   Authorization Type g codes 2022-11-04   PT Start Time 05-24-39   PT Stop Time 1115   PT Time Calculation (min) 34 min   Equipment Utilized During Treatment Gait belt   Activity Tolerance Patient tolerated treatment well   Behavior During Therapy WFL for tasks assessed/performed      Past Medical History:  Diagnosis Date  . A-fib ([place])   . CHF (congestive heart failure) ([place])   . COPD (chronic obstructive pulmonary disease) ([place])   . Difficult intubation   . Hyperlipidemia   . Hypertension   . Hypothyroidism   . Kidney disease   . Sleep apnea   . Stroke ([place])   . Thyroid disease     Past Surgical History:  Procedure Laterality Date  . CARDIAC CATHETERIZATION    . CATARACT EXTRACTION W/ INTRAOCULAR LENS  IMPLANT, BILATERAL    . COLONOSCOPY  May 24, 2006  . HERNIA REPAIR     bilateral inguinal hernia/ [place]  . HERNIA REPAIR  02/02/2015   18 x 28 cm ventral light mesh placed laparoscopically.  . TONSILLECTOMY    . UMBILICAL HERNIA REPAIR N/A 02/02/2015   Procedure: HERNIA REPAIR UMBILICAL ADULT;  Surgeon: Robert Bellow, MD;  Location: [facility] ORS;  Service: General;  Laterality: N/A;  . VENTRAL HERNIA REPAIR N/A 02/02/2015   Procedure: LAPAROSCOPIC VENTRAL HERNIA;  Surgeon: Robert Bellow, MD;  Location: [facility] ORS;  Service: General;  Laterality: N/A;  . vp shunt placement  1979    There were no vitals filed for this visit.      Subjective Assessment - 07/05/16 1047    Subjective Pt reports he is doing well at this date, states no  major changes other than being sick with a chest cold. Patient reports he does not go to [facility] as frequently as he did in the past.    Pertinent History Pt reports a history of rapid onset balance difficulty since 05/23/12. He reports that the initial episode occurred due to improper use of diuretic with hypokalemia. One year later he had another episode with a fall. He was sent to ENT and had a VNG study which was normal. ENT also ordered a head CT and MRI. MRI showed chronic R MCA infarct. Pt comes for [employer] 2-3x/wk. He has completed pulmonary rehab at [facility] twice. Pt denies dizziness or vertigo but reports difficulty with his balance    Limitations Walking   Patient Stated Goals Improve balance so that he only needs cane or no assistive device   Currently in Pain? No/denies      Neuromuscular Re-ed:   Balloon hitting while standing on airex pad with narrow BOS - 3 x 1 min  Stepping forward/backward from one airex to another (pads around 14" from each other ) x 20  Side stepping up and over double airex pad  - x 10      Therapeutic Exercise:  NuStep level 15 x4 minutes with cues to push quickly and with power with BLEs. Fatigue toward  end.  Mini squats on airex pad - x 20  Standing hip abduction from blue airex pad - x 20   Patient demonstrates LE fatigue by end of the session        PT Education - 07/05/16 1050    Education provided Yes   Education Details Exercise form/technique   Person(s) Educated Patient   Methods Explanation;Demonstration   Comprehension Verbalized understanding;Returned demonstration             PT Long Term Goals - 06/16/16 1041      PT LONG TERM GOAL #1   Title Pt will be independent with HEP in order to improve strength and balance in order to decrease fall risk and improve function at home and work.   Time 8   Period Weeks   Status On-going     PT LONG TERM GOAL #2   Title Pt will improve BERG to 48 points in order to demonstrate  clinically significant improvement in balance.    Baseline 04/19/16: 38/56; 05/24/16: 44/56 06/16/16: 43/56   Time 8   Period Weeks   Status On-going     PT LONG TERM GOAL #3   Title Pt will decrease TUG to below 25 seconds in order to demonstrate decreased fall risk and improved LE strength      Baseline 04/19/16: 36.15 seconds; 05/24/16: 20sec; 06/16/16: 22.07 seconds   Time 8   Period Weeks   Status Achieved     PT LONG TERM GOAL #4   Title Pt will increase 10MWT by at least 0.13 m/s in order to demonstrate clinically significant improvement in community ambulation.    Baseline 04/19/16: 0.75 m/s; 05/24/2016 .833; 06/16/16: 11.4s = 0.88 m/s   Time 8   Period Days   Status On-going     PT LONG TERM GOAL #5   Title Pt will decrease TUG to below 20 seconds in order to demonstrate decreased fall risk and improved LE strength      Baseline 04/19/16: 36.15 seconds; 05/24/16: 20sec; 06/16/16: 22.07 seconds   Status New               Plan - 07/05/16 1054    Clinical Impression Statement Pt demonstrates slow performance of exercise and frequent rest breaks throughout session indicating decreased cardiovascular endurance and muscular endurace. Patient demonstrates ability to perform stepping from one airex pad to the other without using UE support. Patient continues to demonstrate decreased strength throughout session and will benefit from further skilled therapy to decrease fall risk.     Rehab Potential Good   Clinical Impairments Affecting Rehab Potential Positive: motivation; Negative: chronic balance dysfunction, chronic CVA   PT Frequency 2x / week   PT Duration 8 weeks   PT Treatment/Interventions ADLs/Self Care Home Management;Cryotherapy;Moist Heat;Neuromuscular re-education;Gait training;Functional mobility training;Stair training;Therapeutic activities;Therapeutic exercise;Balance training;Manual techniques;Aquatic Therapy;Canalith Repostioning;Electrical Stimulation;Traction;Ultrasound;DME  Instruction;Patient/family education;Passive range of motion;Energy conservation;Vestibular   PT Next Visit Plan Outcome meausures, update goals, recert as appropriate, continue LE strengthening and balance training   PT Home Exercise Plan As prescribed   Consulted and Agree with Plan of Care Patient      Patient will benefit from skilled therapeutic intervention in order to improve the following deficits and impairments:  Abnormal gait, Decreased activity tolerance, Decreased balance, Decreased strength, Difficulty walking, Obesity, Decreased coordination  Visit Diagnosis: Difficulty in walking, not elsewhere classified  Muscle weakness (generalized)  Gait difficulty     Problem List Patient Active Problem List   Diagnosis Date  Noted  . Cough 03/20/2015  . Bronchitis, chronic obstructive w acute bronchitis ([place]) 03/20/2015  . Umbilical hernia without obstruction and without gangrene 01/12/2015  . Ventral hernia without obstruction or gangrene 01/02/2015    Blythe Stanford, PT DPT 07/05/2016, 11:14 AM  [place] MAIN [facility] SERVICES [address] [place], [place], [address] Phone: [phone]   Fax:  [phone]  Name: Camden Knotek. MRN: [id number] Date of Birth: 23-May-1940

## 2016-07-07 ENCOUNTER — Ambulatory Visit: Payer: PPO

## 2016-07-07 DIAGNOSIS — R262 Difficulty in walking, not elsewhere classified: Secondary | ICD-10-CM

## 2016-07-07 DIAGNOSIS — R269 Unspecified abnormalities of gait and mobility: Secondary | ICD-10-CM

## 2016-07-07 DIAGNOSIS — M6281 Muscle weakness (generalized): Secondary | ICD-10-CM

## 2016-07-07 NOTE — Therapy (Addendum)
[place] MAIN [facility] SERVICES [address] [place], [place], [address] Phone: [phone]   Fax:  [phone]  Physical Therapy Treatment  Patient Details  Name: Daniel Eaton. MRN: [id number] Date of Birth: Jun 05, 1940 Referring Provider: Dr. Ouida Sills  Encounter Date: 07/07/2016      PT End of Session - 07/07/16 1217    Visit Number 20   Number of Visits 33   Authorization Type g codes 10/10   PT Start Time 1130  pt arrived late   PT Stop Time 1200   PT Time Calculation (min) 30 min   Equipment Utilized During Treatment --  min guard to S prn   Activity Tolerance Patient tolerated treatment well;Patient limited by fatigue   Behavior During Therapy [facility] for tasks assessed/performed      Past Medical History:  Diagnosis Date  . A-fib ([place])   . CHF (congestive heart failure) ([place])   . COPD (chronic obstructive pulmonary disease) ([place])   . Difficult intubation   . Hyperlipidemia   . Hypertension   . Hypothyroidism   . Kidney disease   . Sleep apnea   . Stroke ([place])   . Thyroid disease     Past Surgical History:  Procedure Laterality Date  . CARDIAC CATHETERIZATION    . CATARACT EXTRACTION W/ INTRAOCULAR LENS  IMPLANT, BILATERAL    . COLONOSCOPY  2008  . HERNIA REPAIR     bilateral inguinal hernia/ [place]  . HERNIA REPAIR  02/02/2015   18 x 28 cm ventral light mesh placed laparoscopically.  . TONSILLECTOMY    . UMBILICAL HERNIA REPAIR N/A 02/02/2015   Procedure: HERNIA REPAIR UMBILICAL ADULT;  Surgeon: Robert Bellow, MD;  Location: [facility] ORS;  Service: General;  Laterality: N/A;  . VENTRAL HERNIA REPAIR N/A 02/02/2015   Procedure: LAPAROSCOPIC VENTRAL HERNIA;  Surgeon: Robert Bellow, MD;  Location: [facility] ORS;  Service: General;  Laterality: N/A;  . vp shunt placement  1979    There were no vitals filed for this visit.      Subjective Assessment - 07/07/16 1132    Subjective Pt reports he's having a hard time  breathing today. Pt denied falls since last visit.    Pertinent History Pt reports a history of rapid onset balance difficulty since 2014. He reports that the initial episode occurred due to improper use of diuretic with hypokalemia. One year later he had another episode with a fall. He was sent to ENT and had a VNG study which was normal. ENT also ordered a head CT and MRI. MRI showed chronic R MCA infarct. Pt comes for [employer] 2-3x/wk. He has completed pulmonary rehab at [facility] twice. Pt denies dizziness or vertigo but reports difficulty with his balance    Patient Stated Goals Improve balance so that he only needs cane or no assistive device   Currently in Pain? Yes   Pain Score 2    Pain Location Knee   Pain Orientation Left;Right   Pain Descriptors / Indicators Sore   Pain Type Acute pain   Pain Onset Yesterday   Pain Frequency Intermittent   Aggravating Factors  standing, walking   Pain Relieving Factors rest                         [facility] Adult PT Treatment/Exercise - 07/07/16 1135      Ambulation/Gait   Ambulation/Gait Yes   Ambulation/Gait Assistance 5: Supervision  Ambulation/Gait Assistance Details Pt amb. during 10MWT. Pt's    Ambulation Distance (Feet) 100 Feet   Assistive device Rolling walker   Gait Pattern Step-through pattern;Decreased stride length;Wide base of support;Decreased dorsiflexion - right;Decreased dorsiflexion - left   Ambulation Surface Level;Indoor   Gait velocity 2.110ft/sec or 0.39m/s     Standardized Balance Assessment   Standardized Balance Assessment Berg Balance Test;Timed Up and Go Test     Berg Balance Test   Sit to Stand Able to stand without using hands and stabilize independently   Standing Unsupported Able to stand safely 2 minutes   Sitting with Back Unsupported but Feet Supported on Floor or Stool Able to sit safely and securely 2 minutes   Stand to Sit Sits safely with minimal use of hands   Transfers Able to transfer  safely, minor use of hands   Standing Unsupported with Eyes Closed Able to stand 10 seconds with supervision   Standing Ubsupported with Feet Together Needs help to attain position but able to stand for 30 seconds with feet together   From Standing, Reach Forward with Outstretched Arm Can reach forward >12 cm safely (5")   From Standing Position, Pick up Object from Floor Able to pick up shoe, needs supervision   From Standing Position, Turn to Look Behind Over each Shoulder Looks behind one side only/other side shows less weight shift   Turn 360 Degrees Able to turn 360 degrees safely but slowly   Standing Unsupported, Alternately Place Feet on Step/Stool Needs assistance to keep from falling or unable to try   Standing Unsupported, One Foot in Front Able to take small step independently and hold 30 seconds   Standing on One Leg Tries to lift leg/unable to hold 3 seconds but remains standing independently   Total Score 38     Timed Up and Go Test   TUG Normal TUG   Normal TUG (seconds) 35.7  without RW and 29.6 with RW                PT Education - 07/07/16 1212    Education provided Yes   Education Details PT educated pt on outcome measure results and the need to use RW at all times 2/2 risk for falls.    Person(s) Educated Patient   Methods Explanation   Comprehension Verbalized understanding             PT Long Term Goals - 06/16/16 1041      PT LONG TERM GOAL #1   Title Pt will be independent with HEP in order to improve strength and balance in order to decrease fall risk and improve function at home and work.   Time 8   Period Weeks   Status On-going     PT LONG TERM GOAL #2   Title Pt will improve BERG to 48 points in order to demonstrate clinically significant improvement in balance.    Baseline 04/19/16: 38/56; 05/24/16: 44/56 06/16/16: 43/56   Time 8   Period Weeks   Status On-going     PT LONG TERM GOAL #3   Title Pt will decrease TUG to below 25 seconds  in order to demonstrate decreased fall risk and improved LE strength      Baseline 04/19/16: 36.15 seconds; 05/24/16: 20sec; 06/16/16: 22.07 seconds   Time 8   Period Weeks   Status Achieved     PT LONG TERM GOAL #4   Title Pt will increase 10MWT by at least 0.13  m/s in order to demonstrate clinically significant improvement in community ambulation.    Baseline 04/19/16: 0.75 m/s; 05/24/2016 .833; 06/16/16: 11.4s = 0.88 m/s   Time 8   Period Days   Status On-going     PT LONG TERM GOAL #5   Title Pt will decrease TUG to below 20 seconds in order to demonstrate decreased fall risk and improved LE strength      Baseline 04/19/16: 36.15 seconds; 05/24/16: 20sec; 06/16/16: 22.07 seconds   Status New               Plan - Jul 11, 2016 1218    Clinical Impression Statement Pt's BERG score and TUG times continue to indicate pt is at risk for falls. Pt continues to require rest breaks during session 2/2 fatigue. Pt's gait speed indicates he is not able to safely amb. in the community Pt would continue to benefit from skilled PT to improve safety during functional mobility.    Rehab Potential Good   Clinical Impairments Affecting Rehab Potential Positive: motivation; Negative: chronic balance dysfunction, chronic CVA   PT Frequency 2x / week   PT Duration 8 weeks   PT Treatment/Interventions ADLs/Self Care Home Management;Cryotherapy;Moist Heat;Neuromuscular re-education;Gait training;Functional mobility training;Stair training;Therapeutic activities;Therapeutic exercise;Balance training;Manual techniques;Aquatic Therapy;Canalith Repostioning;Electrical Stimulation;Traction;Ultrasound;DME Instruction;Patient/family education;Passive range of motion;Energy conservation;Vestibular   PT Next Visit Plan continue LE strengthening and balance training   PT Home Exercise Plan As prescribed   Consulted and Agree with Plan of Care Patient      Patient will benefit from skilled therapeutic intervention in order to  improve the following deficits and impairments:  Abnormal gait, Decreased activity tolerance, Decreased balance, Decreased strength, Difficulty walking, Obesity, Decreased coordination  Visit Diagnosis: Difficulty in walking, not elsewhere classified  Muscle weakness (generalized)  Gait difficulty       G-Codes - Jul 11, 2016 1224    Functional Assessment Tool Used (Outpatient Only) Berg balance test, TUG, 70m gait speed, clinical judgement   Functional Limitation Mobility: Walking and moving around   Mobility: Walking and Moving Around Current Status [phone]) At least 40 percent but less than 60 percent impaired, limited or restricted   Mobility: Walking and Moving Around Goal Status [phone]) At least 20 percent but less than 40 percent impaired, limited or restricted      Problem List Patient Active Problem List   Diagnosis Date Noted  . Cough 03/20/2015  . Bronchitis, chronic obstructive w acute bronchitis ([place]) 03/20/2015  . Umbilical hernia without obstruction and without gangrene 01/12/2015  . Ventral hernia without obstruction or gangrene 01/02/2015    Avaeh Ewer L 07-11-2016, 12:24 PM  [place] MAIN [facility] SERVICES [address] [place], [place], [address] Phone: [phone]   Fax:  [phone]  Name: Daniel Eaton. MRN: [id number] Date of Birth: [birth year]  Physical Therapy Progress Note  Dates of Reporting Period: 05/24/16 to 2016-07-11  Objective Reports of Subjective Statement: See above.  Objective Measurements: See above.  Goal Update:      PT Long Term Goals - 06/16/16 1041      PT LONG TERM GOAL #1   Title Pt will be independent with HEP in order to improve strength and balance in order to decrease fall risk and improve function at home and work.   Time 8   Period Weeks   Status On-going     PT LONG TERM GOAL #2   Title Pt will improve BERG to 48 points in order to demonstrate clinically significant  improvement in balance.    Baseline 04/19/16: 38/56; 05/24/16: 44/56 06/16/16: 43/56   Time 8   Period Weeks   Status On-going     PT LONG TERM GOAL #3   Title Pt will decrease TUG to below 25 seconds in order to demonstrate decreased fall risk and improved LE strength      Baseline 04/19/16: 36.15 seconds; 05/24/16: 20sec; 06/16/16: 22.07 seconds   Time 8   Period Weeks   Status Achieved     PT LONG TERM GOAL #4   Title Pt will increase 10MWT by at least 0.13 m/s in order to demonstrate clinically significant improvement in community ambulation.    Baseline 04/19/16: 0.75 m/s; 05/24/2016 .833; 06/16/16: 11.4s = 0.88 m/s   Time 8   Period Days   Status On-going     PT LONG TERM GOAL #5   Title Pt will decrease TUG to below 20 seconds in order to demonstrate decreased fall risk and improved LE strength      Baseline 04/19/16: 36.15 seconds; 05/24/16: 20sec; 06/16/16: 22.07 seconds   Status New       Plan: Continue to progress strength, balance, and endurance training.  Reason Skilled Services are Required: To improve pt's safety during functional mobility.    Geoffry Paradise, PT,DPT 07/07/16 12:24 PM Phone: [phone] Fax: [phone]

## 2016-07-12 ENCOUNTER — Ambulatory Visit: Payer: PPO

## 2016-07-12 VITALS — BP 151/77 | HR 80

## 2016-07-12 DIAGNOSIS — M6281 Muscle weakness (generalized): Secondary | ICD-10-CM

## 2016-07-12 DIAGNOSIS — R262 Difficulty in walking, not elsewhere classified: Secondary | ICD-10-CM

## 2016-07-12 NOTE — Therapy (Signed)
[place] MAIN [facility] SERVICES [address] [place], [place], [address] Phone: [phone]   Fax:  [phone]  Physical Therapy Treatment  Patient Details  Name: Daniel Eaton. MRN: [id number] Date of Birth: [birth year] Referring Provider: Dr. Ouida Sills  Encounter Date: 07/12/2016      PT End of Session - 07/12/16 1608    Visit Number 21   Number of Visits 33   Authorization Type g codes 21/10   PT Start Time 7628   PT Stop Time 1530   PT Time Calculation (min) 33 min   Equipment Utilized During Treatment --  min guard to S prn   Activity Tolerance Patient tolerated treatment well;Patient limited by fatigue   Behavior During Therapy [facility] for tasks assessed/performed      Past Medical History:  Diagnosis Date  . A-fib ([place])   . CHF (congestive heart failure) ([place])   . COPD (chronic obstructive pulmonary disease) ([place])   . Difficult intubation   . Hyperlipidemia   . Hypertension   . Hypothyroidism   . Kidney disease   . Sleep apnea   . Stroke ([place])   . Thyroid disease     Past Surgical History:  Procedure Laterality Date  . CARDIAC CATHETERIZATION    . CATARACT EXTRACTION W/ INTRAOCULAR LENS  IMPLANT, BILATERAL    . COLONOSCOPY  2008  . HERNIA REPAIR     bilateral inguinal hernia/ [place]  . HERNIA REPAIR  02/02/2015   18 x 28 cm ventral light mesh placed laparoscopically.  . TONSILLECTOMY    . UMBILICAL HERNIA REPAIR N/A 02/02/2015   Procedure: HERNIA REPAIR UMBILICAL ADULT;  Surgeon: Robert Bellow, MD;  Location: [facility] ORS;  Service: General;  Laterality: N/A;  . VENTRAL HERNIA REPAIR N/A 02/02/2015   Procedure: LAPAROSCOPIC VENTRAL HERNIA;  Surgeon: Robert Bellow, MD;  Location: [facility] ORS;  Service: General;  Laterality: N/A;  . vp shunt placement  1979    Vitals:   07/12/16 1502  BP: (!) 151/77  Pulse: 80  SpO2: 97%        Subjective Assessment - 07/12/16 1606    Subjective Pt reports he is doing  well today. He is performing HEP. No specific questions or concerns. Reports some chronic R shoulder soreness but no pain.   Pertinent History Pt reports a history of rapid onset balance difficulty since 2014. He reports that the initial episode occurred due to improper use of diuretic with hypokalemia. One year later he had another episode with a fall. He was sent to ENT and had a VNG study which was normal. ENT also ordered a head CT and MRI. MRI showed chronic R MCA infarct. Pt comes for [employer] 2-3x/wk. He has completed pulmonary rehab at [facility] twice. Pt denies dizziness or vertigo but reports difficulty with his balance    Patient Stated Goals Improve balance so that he only needs cane or no assistive device   Currently in Pain? No/denies      TREATMENT  Neuromuscular Re-ed Rockerboard R/L and A/P squats x 10 each direction; Step ups to 5" step with Airex on floor and Airex on steps alternating LE x 10 each; Sidestepping over 1/2 foam roll x 10 each direction;  Therapeutic Exercise Marching in standing with RTB 2 x 15 bilateral; Standing hip abduction with RTB 2 x 15 bilateral  Patient demonstrates LE fatigue by end of the session, requires intermittent rest breaks;  PT Education - 07/12/16 1607    Education provided Yes   Education Details Exercise technique/form   Person(s) Educated Patient   Methods Explanation   Comprehension Verbalized understanding             PT Long Term Goals - 06/16/16 1041      PT LONG TERM GOAL #1   Title Pt will be independent with HEP in order to improve strength and balance in order to decrease fall risk and improve function at home and work.   Time 8   Period Weeks   Status On-going     PT LONG TERM GOAL #2   Title Pt will improve BERG to 48 points in order to demonstrate clinically significant improvement in balance.    Baseline 04/19/16: 38/56; 05/24/16: 44/56 06/16/16: 43/56   Time 8    Period Weeks   Status On-going     PT LONG TERM GOAL #3   Title Pt will decrease TUG to below 25 seconds in order to demonstrate decreased fall risk and improved LE strength      Baseline 04/19/16: 36.15 seconds; 05/24/16: 20sec; 06/16/16: 22.07 seconds   Time 8   Period Weeks   Status Achieved     PT LONG TERM GOAL #4   Title Pt will increase 10MWT by at least 0.13 m/s in order to demonstrate clinically significant improvement in community ambulation.    Baseline 04/19/16: 0.75 m/s; 05/24/2016 .833; 06/16/16: 11.4s = 0.88 m/s   Time 8   Period Days   Status On-going     PT LONG TERM GOAL #5   Title Pt will decrease TUG to below 20 seconds in order to demonstrate decreased fall risk and improved LE strength      Baseline 04/19/16: 36.15 seconds; 05/24/16: 20sec; 06/16/16: 22.07 seconds   Status New               Plan - 07/12/16 1609    Clinical Impression Statement Pt is easily during session requiring frequent rest breaks. He reports increased bilateral knee soreness with step-ups during session. He also demonstrates difficulty with balance on unstable surfaces. Pt encouraged to continue HEP and follow-up as scheduled.    Rehab Potential Good   Clinical Impairments Affecting Rehab Potential Positive: motivation; Negative: chronic balance dysfunction, chronic CVA   PT Frequency 2x / week   PT Duration 8 weeks   PT Treatment/Interventions ADLs/Self Care Home Management;Cryotherapy;Moist Heat;Neuromuscular re-education;Gait training;Functional mobility training;Stair training;Therapeutic activities;Therapeutic exercise;Balance training;Manual techniques;Aquatic Therapy;Canalith Repostioning;Electrical Stimulation;Traction;Ultrasound;DME Instruction;Patient/family education;Passive range of motion;Energy conservation;Vestibular   PT Next Visit Plan continue LE strengthening and balance training   PT Home Exercise Plan As prescribed   Consulted and Agree with Plan of Care Patient       Patient will benefit from skilled therapeutic intervention in order to improve the following deficits and impairments:  Abnormal gait, Decreased activity tolerance, Decreased balance, Decreased strength, Difficulty walking, Obesity, Decreased coordination  Visit Diagnosis: Difficulty in walking, not elsewhere classified  Muscle weakness (generalized)     Problem List Patient Active Problem List   Diagnosis Date Noted  . Cough 03/20/2015  . Bronchitis, chronic obstructive w acute bronchitis ([place]) 03/20/2015  . Umbilical hernia without obstruction and without gangrene 01/12/2015  . Ventral hernia without obstruction or gangrene 01/02/2015   Phillips Grout PT, DPT   Huprich,Jason 07/12/2016, 4:12 PM  [place] MAIN [facility] SERVICES [address] [place], [place], [address] Phone: [phone]   Fax:  [phone]  Name: Daniel Eaton. MRN: [id number] Date of Birth: [birth year]

## 2016-07-18 DIAGNOSIS — M9908 Segmental and somatic dysfunction of rib cage: Secondary | ICD-10-CM | POA: Diagnosis not present

## 2016-07-18 DIAGNOSIS — M9903 Segmental and somatic dysfunction of lumbar region: Secondary | ICD-10-CM | POA: Diagnosis not present

## 2016-07-18 DIAGNOSIS — M9902 Segmental and somatic dysfunction of thoracic region: Secondary | ICD-10-CM | POA: Diagnosis not present

## 2016-07-18 DIAGNOSIS — M9904 Segmental and somatic dysfunction of sacral region: Secondary | ICD-10-CM | POA: Diagnosis not present

## 2016-07-19 ENCOUNTER — Ambulatory Visit: Payer: PPO | Attending: Internal Medicine

## 2016-07-19 DIAGNOSIS — R262 Difficulty in walking, not elsewhere classified: Secondary | ICD-10-CM

## 2016-07-19 DIAGNOSIS — R269 Unspecified abnormalities of gait and mobility: Secondary | ICD-10-CM | POA: Diagnosis not present

## 2016-07-19 DIAGNOSIS — M6281 Muscle weakness (generalized): Secondary | ICD-10-CM

## 2016-07-19 NOTE — Therapy (Signed)
[place] MAIN [facility] SERVICES [address] [place], [place], [address] Phone: [phone]   Fax:  [phone]  Physical Therapy Treatment  Patient Details  Name: Daniel Eaton. MRN: [id number] Date of Birth: [birth year] Referring Provider: Dr. Ouida Sills  Encounter Date: 07/19/2016      PT End of Session - 07/19/16 1854    Visit Number 22   Number of Visits 33   Authorization Type g codes 22/10   PT Start Time 7619   PT Stop Time 1345   PT Time Calculation (min) 30 min   Equipment Utilized During Treatment --  min guard to S prn   Activity Tolerance Patient tolerated treatment well;Patient limited by fatigue   Behavior During Therapy [facility] for tasks assessed/performed      Past Medical History:  Diagnosis Date  . A-fib ([place])   . CHF (congestive heart failure) ([place])   . COPD (chronic obstructive pulmonary disease) ([place])   . Difficult intubation   . Hyperlipidemia   . Hypertension   . Hypothyroidism   . Kidney disease   . Sleep apnea   . Stroke ([place])   . Thyroid disease     Past Surgical History:  Procedure Laterality Date  . CARDIAC CATHETERIZATION    . CATARACT EXTRACTION W/ INTRAOCULAR LENS  IMPLANT, BILATERAL    . COLONOSCOPY  2008  . HERNIA REPAIR     bilateral inguinal hernia/ [place]  . HERNIA REPAIR  02/02/2015   18 x 28 cm ventral light mesh placed laparoscopically.  . TONSILLECTOMY    . UMBILICAL HERNIA REPAIR N/A 02/02/2015   Procedure: HERNIA REPAIR UMBILICAL ADULT;  Surgeon: Robert Bellow, MD;  Location: [facility] ORS;  Service: General;  Laterality: N/A;  . VENTRAL HERNIA REPAIR N/A 02/02/2015   Procedure: LAPAROSCOPIC VENTRAL HERNIA;  Surgeon: Robert Bellow, MD;  Location: [facility] ORS;  Service: General;  Laterality: N/A;  . vp shunt placement  1979    There were no vitals filed for this visit.      Subjective Assessment - 07/19/16 1852    Subjective Patient is running late today and was flustered.    Pertinent History Pt reports a history of rapid onset balance difficulty since 2014. He reports that the initial episode occurred due to improper use of diuretic with hypokalemia. One year later he had another episode with a fall. He was sent to ENT and had a VNG study which was normal. ENT also ordered a head CT and MRI. MRI showed chronic R MCA infarct. Pt comes for [employer] 2-3x/wk. He has completed pulmonary rehab at [facility] twice. Pt denies dizziness or vertigo but reports difficulty with his balance    Patient Stated Goals Improve balance so that he only needs cane or no assistive device   Currently in Pain? No/denies    Neuro Re-ed Step over and back  half foam roller x20, CGA Airex pad balance 30-60 seconds,  CGA Airex pad marching 20x CGA Airex pad UE waves 20x, CGA rockerboard 60 minutes  TherEx Ambulating no walker with CGA 2x20 ft Ankle pumps 20x 3x Superset of: 7x sit to stand, 10x shoulder scapular retractions in seated Forward and backwards in // bars   Pt. Required CGA and verbal cueing throughout session for proper body mechanics and safe mobility.       PT Long Term Goals - 06/16/16 1041      PT LONG TERM GOAL #1   Title Pt will be  independent with HEP in order to improve strength and balance in order to decrease fall risk and improve function at home and work.   Time 8   Period Weeks   Status On-going     PT LONG TERM GOAL #2   Title Pt will improve BERG to 48 points in order to demonstrate clinically significant improvement in balance.    Baseline 04/19/16: 38/56; 05/24/16: 44/56 06/16/16: 43/56   Time 8   Period Weeks   Status On-going     PT LONG TERM GOAL #3   Title Pt will decrease TUG to below 25 seconds in order to demonstrate decreased fall risk and improved LE strength      Baseline 04/19/16: 36.15 seconds; 05/24/16: 20sec; 06/16/16: 22.07 seconds   Time 8   Period Weeks   Status Achieved     PT LONG TERM GOAL #4   Title Pt will increase 10MWT by at  least 0.13 m/s in order to demonstrate clinically significant improvement in community ambulation.    Baseline 04/19/16: 0.75 m/s; 05/24/2016 .833; 06/16/16: 11.4s = 0.88 m/s   Time 8   Period Days   Status On-going     PT LONG TERM GOAL #5   Title Pt will decrease TUG to below 20 seconds in order to demonstrate decreased fall risk and improved LE strength      Baseline 04/19/16: 36.15 seconds; 05/24/16: 20sec; 06/16/16: 22.07 seconds   Status New               Plan - 07/19/16 1856    Clinical Impression Statement Patient challenged by unstable surfaces and required rest breaks throughout session due to fatigue. Pt. Demonstrates ability to perform step overs and back with occasional UE assistance for maintaining COM. Patient encouraged to continue HEP. Patient will continue to benefit from skilled physical therapy to improve safety during functional mobility.    Rehab Potential Good   Clinical Impairments Affecting Rehab Potential Positive: motivation; Negative: chronic balance dysfunction, chronic CVA   PT Frequency 2x / week   PT Duration 8 weeks   PT Treatment/Interventions ADLs/Self Care Home Management;Cryotherapy;Moist Heat;Neuromuscular re-education;Gait training;Functional mobility training;Stair training;Therapeutic activities;Therapeutic exercise;Balance training;Manual techniques;Aquatic Therapy;Canalith Repostioning;Electrical Stimulation;Traction;Ultrasound;DME Instruction;Patient/family education;Passive range of motion;Energy conservation;Vestibular   PT Next Visit Plan continue LE strengthening and balance training   PT Home Exercise Plan As prescribed   Consulted and Agree with Plan of Care Patient      Patient will benefit from skilled therapeutic intervention in order to improve the following deficits and impairments:  Abnormal gait, Decreased activity tolerance, Decreased balance, Decreased strength, Difficulty walking, Obesity, Decreased coordination  Visit  Diagnosis: Difficulty in walking, not elsewhere classified  Muscle weakness (generalized)  Gait difficulty     Problem List Patient Active Problem List   Diagnosis Date Noted  . Cough 03/20/2015  . Bronchitis, chronic obstructive w acute bronchitis ([place]) 03/20/2015  . Umbilical hernia without obstruction and without gangrene 01/12/2015  . Ventral hernia without obstruction or gangrene 01/02/2015   Janna Arch, PT, DPT   07/19/2016, 6:59 PM  [place] MAIN [facility] SERVICES [address] [place], [place], [address] Phone: [phone]   Fax:  [phone]  Name: Daniel Eaton. MRN: [id number] Date of Birth: 1940-05-09

## 2016-07-21 ENCOUNTER — Ambulatory Visit: Payer: PPO

## 2016-07-21 DIAGNOSIS — R262 Difficulty in walking, not elsewhere classified: Secondary | ICD-10-CM | POA: Diagnosis not present

## 2016-07-21 DIAGNOSIS — M6281 Muscle weakness (generalized): Secondary | ICD-10-CM

## 2016-07-21 DIAGNOSIS — R269 Unspecified abnormalities of gait and mobility: Secondary | ICD-10-CM

## 2016-07-21 NOTE — Therapy (Signed)
[place] MAIN [facility] SERVICES [address] [place], [place], [address] Phone: [phone]   Fax:  [phone]  Physical Therapy Treatment  Patient Details  Name: Daniel Eaton. MRN: [id number] Date of Birth: 11/22/1940 Referring Provider: Dr. Ouida Sills  Encounter Date: 07/21/2016      PT End of Session - 07/21/16 1352    Visit Number 23   Number of Visits 33   Authorization Type g codes 23/10   PT Start Time 1309   PT Stop Time 1347   PT Time Calculation (min) 38 min   Equipment Utilized During Treatment Gait belt  min guard to S prn   Activity Tolerance Patient tolerated treatment well;Patient limited by fatigue   Behavior During Therapy WFL for tasks assessed/performed      Past Medical History:  Diagnosis Date  . A-fib ([place])   . CHF (congestive heart failure) ([place])   . COPD (chronic obstructive pulmonary disease) ([place])   . Difficult intubation   . Hyperlipidemia   . Hypertension   . Hypothyroidism   . Kidney disease   . Sleep apnea   . Stroke ([place])   . Thyroid disease     Past Surgical History:  Procedure Laterality Date  . CARDIAC CATHETERIZATION    . CATARACT EXTRACTION W/ INTRAOCULAR LENS  IMPLANT, BILATERAL    . COLONOSCOPY  2008  . HERNIA REPAIR     bilateral inguinal hernia/ [place]  . HERNIA REPAIR  02/02/2015   18 x 28 cm ventral light mesh placed laparoscopically.  . TONSILLECTOMY    . UMBILICAL HERNIA REPAIR N/A 02/02/2015   Procedure: HERNIA REPAIR UMBILICAL ADULT;  Surgeon: Robert Bellow, MD;  Location: [facility] ORS;  Service: General;  Laterality: N/A;  . VENTRAL HERNIA REPAIR N/A 02/02/2015   Procedure: LAPAROSCOPIC VENTRAL HERNIA;  Surgeon: Robert Bellow, MD;  Location: [facility] ORS;  Service: General;  Laterality: N/A;  . vp shunt placement  1979    There were no vitals filed for this visit.      Subjective Assessment - 07/21/16 1350    Subjective Pt. reports that he was sore after last  session. He has started to do the scapular retraction stretch at home and it has been helping.    Pertinent History Pt reports a history of rapid onset balance difficulty since 2014. He reports that the initial episode occurred due to improper use of diuretic with hypokalemia. One year later he had another episode with a fall. He was sent to ENT and had a VNG study which was normal. ENT also ordered a head CT and MRI. MRI showed chronic R MCA infarct. Pt comes for [employer] 2-3x/wk. He has completed pulmonary rehab at [facility] twice. Pt denies dizziness or vertigo but reports difficulty with his balance    Patient Stated Goals Improve balance so that he only needs cane or no assistive device   Currently in Pain? No/denies      Neuro Re-ed Step over and back  half foam roller x20, CGA Airex pad balance 30-60 seconds,  CGA Airex pad marching 20x CGA    TherEx Ambulating no walker with CGA 2x30 ft Ankle pumps 20x with 4lb ankle weights 3x 15 shoulder scapular retractions in seated Seated marching 4# ankle weights  2x30  Seated abduction 4lb ankle weights 2x15 to stepping stone Opening chest with arm raises to promote upright posture 2 x15x  In in out out 4lb ankle weights 20x   Pt.  Required CGA and verbal cueing throughout session for proper body mechanics and safe mobility.  Pt. response to medical necessity: Patient will continue to benefit from skilled physical therapy to improve safety during functional mobility.          PT Long Term Goals - 06/16/16 1041      PT LONG TERM GOAL #1   Title Pt will be independent with HEP in order to improve strength and balance in order to decrease fall risk and improve function at home and work.   Time 8   Period Weeks   Status On-going     PT LONG TERM GOAL #2   Title Pt will improve BERG to 48 points in order to demonstrate clinically significant improvement in balance.    Baseline 04/19/16: 38/56; 05/24/16: 44/56 06/16/16: 43/56   Time 8    Period Weeks   Status On-going     PT LONG TERM GOAL #3   Title Pt will decrease TUG to below 25 seconds in order to demonstrate decreased fall risk and improved LE strength      Baseline 04/19/16: 36.15 seconds; 05/24/16: 20sec; 06/16/16: 22.07 seconds   Time 8   Period Weeks   Status Achieved     PT LONG TERM GOAL #4   Title Pt will increase 10MWT by at least 0.13 m/s in order to demonstrate clinically significant improvement in community ambulation.    Baseline 04/19/16: 0.75 m/s; 05/24/2016 .833; 06/16/16: 11.4s = 0.88 m/s   Time 8   Period Days   Status On-going     PT LONG TERM GOAL #5   Title Pt will decrease TUG to below 20 seconds in order to demonstrate decreased fall risk and improved LE strength      Baseline 04/19/16: 36.15 seconds; 05/24/16: 20sec; 06/16/16: 22.07 seconds   Status New               Plan - 07/21/16 1355    Clinical Impression Statement Patient presents to therapy with some residual fatigue from last session and required frequent rest breaks. Strengthening activities were performed in seated position due to pt. Fatigue. Balance is impacted by patients LE weakness with LLE weaker than RLE. Patient required encouragement for continuation of tasks. Patient will continue to benefit from skilled physical therapy to improve safety during functional mobility.    Rehab Potential Good   Clinical Impairments Affecting Rehab Potential Positive: motivation; Negative: chronic balance dysfunction, chronic CVA   PT Frequency 2x / week   PT Duration 8 weeks   PT Treatment/Interventions ADLs/Self Care Home Management;Cryotherapy;Moist Heat;Neuromuscular re-education;Gait training;Functional mobility training;Stair training;Therapeutic activities;Therapeutic exercise;Balance training;Manual techniques;Aquatic Therapy;Canalith Repostioning;Electrical Stimulation;Traction;Ultrasound;DME Instruction;Patient/family education;Passive range of motion;Energy conservation;Vestibular   PT  Next Visit Plan Nustep and leg press   PT Home Exercise Plan As prescribed   Consulted and Agree with Plan of Care Patient      Patient will benefit from skilled therapeutic intervention in order to improve the following deficits and impairments:  Abnormal gait, Decreased activity tolerance, Decreased balance, Decreased strength, Difficulty walking, Obesity, Decreased coordination  Visit Diagnosis: Difficulty in walking, not elsewhere classified  Muscle weakness (generalized)  Gait difficulty     Problem List Patient Active Problem List   Diagnosis Date Noted  . Cough 03/20/2015  . Bronchitis, chronic obstructive w acute bronchitis ([place]) 03/20/2015  . Umbilical hernia without obstruction and without gangrene 01/12/2015  . Ventral hernia without obstruction or gangrene 01/02/2015   Janna Arch, PT, DPT   07/21/2016,  [place] MAIN [facility] SERVICES [address] [place], [place], [address] Phone: [phone]   Fax:  [phone]  Name: Daniel Eaton. MRN: [id number] Date of Birth: 02-20-1940

## 2016-07-26 ENCOUNTER — Ambulatory Visit: Payer: PPO

## 2016-07-26 DIAGNOSIS — M6281 Muscle weakness (generalized): Secondary | ICD-10-CM

## 2016-07-26 DIAGNOSIS — R262 Difficulty in walking, not elsewhere classified: Secondary | ICD-10-CM

## 2016-07-26 NOTE — Therapy (Signed)
[place] MAIN [facility] SERVICES [address] [place], [place], [address] Phone: [phone]   Fax:  [phone]  Physical Therapy Treatment  Patient Details  Name: Daniel Eaton. MRN: [id number] Date of Birth: March 02, 1940 Referring Provider: Dr. Ouida Sills  Encounter Date: 07/26/2016      PT End of Session - 07/26/16 1531    Visit Number 24   Number of Visits 33   Authorization Type g codes 24/10   PT Start Time 3500   PT Stop Time 1417   PT Time Calculation (min) 38 min   Equipment Utilized During Treatment Gait belt  min guard to S prn   Activity Tolerance Patient tolerated treatment well;Patient limited by fatigue   Behavior During Therapy WFL for tasks assessed/performed      Past Medical History:  Diagnosis Date  . A-fib ([place])   . CHF (congestive heart failure) ([place])   . COPD (chronic obstructive pulmonary disease) ([place])   . Difficult intubation   . Hyperlipidemia   . Hypertension   . Hypothyroidism   . Kidney disease   . Sleep apnea   . Stroke ([place])   . Thyroid disease     Past Surgical History:  Procedure Laterality Date  . CARDIAC CATHETERIZATION    . CATARACT EXTRACTION W/ INTRAOCULAR LENS  IMPLANT, BILATERAL    . COLONOSCOPY  2008  . HERNIA REPAIR     bilateral inguinal hernia/ [place]  . HERNIA REPAIR  02/02/2015   18 x 28 cm ventral light mesh placed laparoscopically.  . TONSILLECTOMY    . UMBILICAL HERNIA REPAIR N/A 02/02/2015   Procedure: HERNIA REPAIR UMBILICAL ADULT;  Surgeon: Robert Bellow, MD;  Location: [facility] ORS;  Service: General;  Laterality: N/A;  . VENTRAL HERNIA REPAIR N/A 02/02/2015   Procedure: LAPAROSCOPIC VENTRAL HERNIA;  Surgeon: Robert Bellow, MD;  Location: [facility] ORS;  Service: General;  Laterality: N/A;  . vp shunt placement  1979    There were no vitals filed for this visit.      Subjective Assessment - 07/26/16 1351    Subjective Pt. went to the gym and did 5 levels on  Nustep for 25 minutes, feeling a little sore today   Pertinent History Pt reports a history of rapid onset balance difficulty since 2014. He reports that the initial episode occurred due to improper use of diuretic with hypokalemia. One year later he had another episode with a fall. He was sent to ENT and had a VNG study which was normal. ENT also ordered a head CT and MRI. MRI showed chronic R MCA infarct. Pt comes for [employer] 2-3x/wk. He has completed pulmonary rehab at [facility] twice. Pt denies dizziness or vertigo but reports difficulty with his balance    Patient Stated Goals Improve balance so that he only needs cane or no assistive device   Currently in Pain? No/denies     Neuro Re-ed Step over and back  half foam roller x20, CGA Airex pad balance 2x 60 seconds,  CGA Airex pad balance with heel raises 60 sec CGA Airex pad marching 20x CGA occasional LOB; stepping off of Airex pad had sharp pain in back of L knee  6" step toe taps with no UE assist  6" step side step ups 10x each side    TherEx Ambulating no walker with CGA 2x20 ft Leg press 90 lb 25x Ankle pumps 20x Sit to stand throughout session with focus on LE use and  decreased UE assistance  Pt. Required CGA and verbal cueing throughout session for proper body mechanics and safe mobility.   Pt. response to medical necessity:  Patient will continue to benefit from skilled physical therapy to improve safety during functional mobility         PT Long Term Goals - 06/16/16 1041      PT LONG TERM GOAL #1   Title Pt will be independent with HEP in order to improve strength and balance in order to decrease fall risk and improve function at home and work.   Time 8   Period Weeks   Status On-going     PT LONG TERM GOAL #2   Title Pt will improve BERG to 48 points in order to demonstrate clinically significant improvement in balance.    Baseline 04/19/16: 38/56; 05/24/16: 44/56 06/16/16: 43/56   Time 8   Period Weeks   Status  On-going     PT LONG TERM GOAL #3   Title Pt will decrease TUG to below 25 seconds in order to demonstrate decreased fall risk and improved LE strength      Baseline 04/19/16: 36.15 seconds; 05/24/16: 20sec; 06/16/16: 22.07 seconds   Time 8   Period Weeks   Status Achieved     PT LONG TERM GOAL #4   Title Pt will increase 10MWT by at least 0.13 m/s in order to demonstrate clinically significant improvement in community ambulation.    Baseline 04/19/16: 0.75 m/s; 05/24/2016 .833; 06/16/16: 11.4s = 0.88 m/s   Time 8   Period Days   Status On-going     PT LONG TERM GOAL #5   Title Pt will decrease TUG to below 20 seconds in order to demonstrate decreased fall risk and improved LE strength      Baseline 04/19/16: 36.15 seconds; 05/24/16: 20sec; 06/16/16: 22.07 seconds   Status New               Plan - 07/26/16 1535    Clinical Impression Statement  Patient presents to therapy with fatigue from his gym day the day prior and with limited breathing capabilities requiring frequent rest breaks.Balance is impacted by patients LE weakness with LLE weaker than RLE. LLE required assistance to be placed upon leg press and would fall without assistance.  Side step and toe taps onto step where challenging to pt. But improved with repetition and pt. Demonstrated ability to perform task without UE assistance. Patient required encouragement for continuation of tasks. Patient will continue to benefit from skilled physical therapy to improve safety during functional mobility   Rehab Potential Good   Clinical Impairments Affecting Rehab Potential Positive: motivation; Negative: chronic balance dysfunction, chronic CVA   PT Frequency 2x / week   PT Duration 8 weeks   PT Treatment/Interventions ADLs/Self Care Home Management;Cryotherapy;Moist Heat;Neuromuscular re-education;Gait training;Functional mobility training;Stair training;Therapeutic activities;Therapeutic exercise;Balance training;Manual techniques;Aquatic  Therapy;Canalith Repostioning;Electrical Stimulation;Traction;Ultrasound;DME Instruction;Patient/family education;Passive range of motion;Energy conservation;Vestibular   PT Next Visit Plan Nustep and leg press   PT Home Exercise Plan As prescribed   Consulted and Agree with Plan of Care Patient      Patient will benefit from skilled therapeutic intervention in order to improve the following deficits and impairments:  Abnormal gait, Decreased activity tolerance, Decreased balance, Decreased strength, Difficulty walking, Obesity, Decreased coordination  Visit Diagnosis: Difficulty in walking, not elsewhere classified  Muscle weakness (generalized)     Problem List Patient Active Problem List   Diagnosis Date Noted  . Cough 03/20/2015  .  Bronchitis, chronic obstructive w acute bronchitis ([place]) 03/20/2015  . Umbilical hernia without obstruction and without gangrene 01/12/2015  . Ventral hernia without obstruction or gangrene 01/02/2015    Janna Arch, PT, DPT   07/26/2016, 3:36 PM  [place] MAIN [facility] SERVICES [address] [place], [place], [address] Phone: [phone]   Fax:  [phone]  Name: Daniel Eaton. MRN: [id number] Date of Birth: [birth year]

## 2016-07-28 ENCOUNTER — Ambulatory Visit: Payer: PPO

## 2016-07-28 DIAGNOSIS — M6281 Muscle weakness (generalized): Secondary | ICD-10-CM

## 2016-07-28 DIAGNOSIS — R262 Difficulty in walking, not elsewhere classified: Secondary | ICD-10-CM | POA: Diagnosis not present

## 2016-07-28 NOTE — Therapy (Signed)
[place] MAIN [facility] SERVICES [address] [place], [place], [address] Phone: [phone]   Fax:  [phone]  Physical Therapy Treatment  Patient Details  Name: Daniel Eaton. MRN: [id number] Date of Birth: [birth year] Referring Provider: Dr. Ouida Sills  Encounter Date: 07/28/2016      PT End of Session - 07/28/16 1347    Visit Number 25   Number of Visits 33   Authorization Type g codes 25/10   PT Start Time 0160   PT Stop Time 1346   PT Time Calculation (min) 38 min   Equipment Utilized During Treatment Gait belt  min guard to S prn   Activity Tolerance Patient tolerated treatment well;Patient limited by fatigue   Behavior During Therapy WFL for tasks assessed/performed      Past Medical History:  Diagnosis Date  . A-fib ([place])   . CHF (congestive heart failure) ([place])   . COPD (chronic obstructive pulmonary disease) ([place])   . Difficult intubation   . Hyperlipidemia   . Hypertension   . Hypothyroidism   . Kidney disease   . Sleep apnea   . Stroke ([place])   . Thyroid disease     Past Surgical History:  Procedure Laterality Date  . CARDIAC CATHETERIZATION    . CATARACT EXTRACTION W/ INTRAOCULAR LENS  IMPLANT, BILATERAL    . COLONOSCOPY  2008  . HERNIA REPAIR     bilateral inguinal hernia/ [place]  . HERNIA REPAIR  02/02/2015   18 x 28 cm ventral light mesh placed laparoscopically.  . TONSILLECTOMY    . UMBILICAL HERNIA REPAIR N/A 02/02/2015   Procedure: HERNIA REPAIR UMBILICAL ADULT;  Surgeon: Robert Bellow, MD;  Location: [facility] ORS;  Service: General;  Laterality: N/A;  . VENTRAL HERNIA REPAIR N/A 02/02/2015   Procedure: LAPAROSCOPIC VENTRAL HERNIA;  Surgeon: Robert Bellow, MD;  Location: [facility] ORS;  Service: General;  Laterality: N/A;  . vp shunt placement  1979    There were no vitals filed for this visit.      Subjective Assessment - 07/28/16 1317    Subjective Pt. was late to session due to having  difficulty with his rental car. He continues to have difficlty breathing limiting his mobility    Pertinent History Pt reports a history of rapid onset balance difficulty since 2014. He reports that the initial episode occurred due to improper use of diuretic with hypokalemia. One year later he had another episode with a fall. He was sent to ENT and had a VNG study which was normal. ENT also ordered a head CT and MRI. MRI showed chronic R MCA infarct. Pt comes for [employer] 2-3x/wk. He has completed pulmonary rehab at [facility] twice. Pt denies dizziness or vertigo but reports difficulty with his balance    Patient Stated Goals Improve balance so that he only needs cane or no assistive device      Neuro Re-ed Step over and back  half foam roller x10, CGA Airex pad balance balloon pass 2x 2 min Airex pad balance ball throw x 5 mins airex beam balance side steps x4 with rests b/w for breathing   TherEx NuStep Lvl 5-7 seat position 13, 5 minutes, difficulty breathing Ambulating no walker with CGA 2x20 ft Shoulder shrugs with arm rotation 20x with breathing 15x Standing abduction alternating legs with no UE assistance 20x good upright posture  Side stepping // bars Ankle pumps 20x Sit to stand throughout session with focus on LE use  and decreased UE assistance   Educated on purse lip breathing for breathing difficulty  Pt. Required CGA and verbal cueing throughout session for proper body mechanics and safe mobility.  Pt. response to medical necessity:  Patient will continue to benefit from skilled physical therapy to improve safety during functional mobility        PT Long Term Goals - 06/16/16 1041      PT LONG TERM GOAL #1   Title Pt will be independent with HEP in order to improve strength and balance in order to decrease fall risk and improve function at home and work.   Time 8   Period Weeks   Status On-going     PT LONG TERM GOAL #2   Title Pt will improve BERG to 48 points in  order to demonstrate clinically significant improvement in balance.    Baseline 04/19/16: 38/56; 05/24/16: 44/56 06/16/16: 43/56   Time 8   Period Weeks   Status On-going     PT LONG TERM GOAL #3   Title Pt will decrease TUG to below 25 seconds in order to demonstrate decreased fall risk and improved LE strength      Baseline 04/19/16: 36.15 seconds; 05/24/16: 20sec; 06/16/16: 22.07 seconds   Time 8   Period Weeks   Status Achieved     PT LONG TERM GOAL #4   Title Pt will increase 10MWT by at least 0.13 m/s in order to demonstrate clinically significant improvement in community ambulation.    Baseline 04/19/16: 0.75 m/s; 05/24/2016 .833; 06/16/16: 11.4s = 0.88 m/s   Time 8   Period Days   Status On-going     PT LONG TERM GOAL #5   Title Pt will decrease TUG to below 20 seconds in order to demonstrate decreased fall risk and improved LE strength      Baseline 04/19/16: 36.15 seconds; 05/24/16: 20sec; 06/16/16: 22.07 seconds   Status New               Plan - 07/28/16 1351    Clinical Impression Statement Patient continues to require frequent rest breaks during therapy because of shortness of breath. However, balance continues to improve with dynamic activities. Balance is impacted by patients LE weakness with LLE weaker than RLE. Balance improved with pt. focus and cueing for task orientation.  Patient required encouragement for continuation of tasks. Patient will continue to benefit from skilled physical therapy to improve safety during functional mobility   Rehab Potential Good   Clinical Impairments Affecting Rehab Potential Positive: motivation; Negative: chronic balance dysfunction, chronic CVA   PT Frequency 2x / week   PT Duration 8 weeks   PT Treatment/Interventions ADLs/Self Care Home Management;Cryotherapy;Moist Heat;Neuromuscular re-education;Gait training;Functional mobility training;Stair training;Therapeutic activities;Therapeutic exercise;Balance training;Manual techniques;Aquatic  Therapy;Canalith Repostioning;Electrical Stimulation;Traction;Ultrasound;DME Instruction;Patient/family education;Passive range of motion;Energy conservation;Vestibular   PT Next Visit Plan Nustep and leg press   PT Home Exercise Plan As prescribed   Consulted and Agree with Plan of Care Patient      Patient will benefit from skilled therapeutic intervention in order to improve the following deficits and impairments:  Abnormal gait, Decreased activity tolerance, Decreased balance, Decreased strength, Difficulty walking, Obesity, Decreased coordination  Visit Diagnosis: Difficulty in walking, not elsewhere classified  Muscle weakness (generalized)     Problem List Patient Active Problem List   Diagnosis Date Noted  . Cough 03/20/2015  . Bronchitis, chronic obstructive w acute bronchitis ([place]) 03/20/2015  . Umbilical hernia without obstruction and without gangrene 01/12/2015  .  Ventral hernia without obstruction or gangrene 01/02/2015   Janna Arch, PT, DPT   07/28/2016, 1:54 PM  [place] MAIN [facility] SERVICES [address] [place], [place], [address] Phone: [phone]   Fax:  [phone]  Name: Daniel Eaton. MRN: [id number] Date of Birth: 1940/05/20

## 2016-08-02 ENCOUNTER — Ambulatory Visit: Payer: PPO

## 2016-08-02 DIAGNOSIS — M6281 Muscle weakness (generalized): Secondary | ICD-10-CM

## 2016-08-02 DIAGNOSIS — R269 Unspecified abnormalities of gait and mobility: Secondary | ICD-10-CM

## 2016-08-02 DIAGNOSIS — R262 Difficulty in walking, not elsewhere classified: Secondary | ICD-10-CM

## 2016-08-02 NOTE — Therapy (Signed)
[place] MAIN [facility] SERVICES [address] [place], [place], [address] Phone: [phone]   Fax:  [phone]  Physical Therapy Treatment  Patient Details  Name: Daniel Eaton. MRN: [id number] Date of Birth: [birth year] Referring Provider: Dr. Ouida Sills  Encounter Date: 08/02/2016      PT End of Session - 08/02/16 1202    Visit Number 26   Number of Visits 33   Authorization Type g codes 26/10   PT Start Time 1120   PT Stop Time 1201   PT Time Calculation (min) 41 min   Equipment Utilized During Treatment Gait belt  min guard to S prn   Activity Tolerance Patient tolerated treatment well;Patient limited by fatigue   Behavior During Therapy WFL for tasks assessed/performed      Past Medical History:  Diagnosis Date  . A-fib ([place])   . CHF (congestive heart failure) ([place])   . COPD (chronic obstructive pulmonary disease) ([place])   . Difficult intubation   . Hyperlipidemia   . Hypertension   . Hypothyroidism   . Kidney disease   . Sleep apnea   . Stroke ([place])   . Thyroid disease     Past Surgical History:  Procedure Laterality Date  . CARDIAC CATHETERIZATION    . CATARACT EXTRACTION W/ INTRAOCULAR LENS  IMPLANT, BILATERAL    . COLONOSCOPY  2008  . HERNIA REPAIR     bilateral inguinal hernia/ [place]  . HERNIA REPAIR  02/02/2015   18 x 28 cm ventral light mesh placed laparoscopically.  . TONSILLECTOMY    . UMBILICAL HERNIA REPAIR N/A 02/02/2015   Procedure: HERNIA REPAIR UMBILICAL ADULT;  Surgeon: Robert Bellow, MD;  Location: [facility] ORS;  Service: General;  Laterality: N/A;  . VENTRAL HERNIA REPAIR N/A 02/02/2015   Procedure: LAPAROSCOPIC VENTRAL HERNIA;  Surgeon: Robert Bellow, MD;  Location: [facility] ORS;  Service: General;  Laterality: N/A;  . vp shunt placement  1979    There were no vitals filed for this visit.      Subjective Assessment - 08/02/16 1127    Subjective Pt. went to gym yesterday and did bike for 20  minutes.    Pertinent History Pt reports a history of rapid onset balance difficulty since 2014. He reports that the initial episode occurred due to improper use of diuretic with hypokalemia. One year later he had another episode with a fall. He was sent to ENT and had a VNG study which was normal. ENT also ordered a head CT and MRI. MRI showed chronic R MCA infarct. Pt comes for [employer] 2-3x/wk. He has completed pulmonary rehab at [facility] twice. Pt denies dizziness or vertigo but reports difficulty with his balance    Patient Stated Goals Improve balance so that he only needs cane or no assistive device   Currently in Pain? No/denies      Neuro Re-ed Step over and back  half foam roller x10, CGA, decreased need for UE assistance Airex pad balance balloon pass 2x 2.5 min Forward lunges bosu ball 10x each leg. bosu ball toe taps 20x Cone taps led by PT directions (3 cones) CGA with finger touch assistance   TherEx Purse lipped breathing with scapular retraction Purse lipped breathing with shoulder circles  Ambulating no walker with CGA 2x30 ft Shoulder shrugs with arm rotation 20x with breathing 15x Side step up 6" step 10x each leg throwing ball into basket at top of each step. Standing abduction alternating legs  with no UE assistance 20x good upright posture  Ankle pumps 20x Sit to stand throughout session with focus on LE use and decreased UE assistance    Pt. Required CGA and verbal cueing throughout session for proper body mechanics and safe mobility.   Pt. response to medical necessity:  Patient will continue to benefit from skilled physical therapy to improve safety during functional mobility        PT Long Term Goals - 06/16/16 1041      PT LONG TERM GOAL #1   Title Pt will be independent with HEP in order to improve strength and balance in order to decrease fall risk and improve function at home and work.   Time 8   Period Weeks   Status On-going     PT LONG TERM  GOAL #2   Title Pt will improve BERG to 48 points in order to demonstrate clinically significant improvement in balance.    Baseline 04/19/16: 38/56; 05/24/16: 44/56 06/16/16: 43/56   Time 8   Period Weeks   Status On-going     PT LONG TERM GOAL #3   Title Pt will decrease TUG to below 25 seconds in order to demonstrate decreased fall risk and improved LE strength      Baseline 04/19/16: 36.15 seconds; 05/24/16: 20sec; 06/16/16: 22.07 seconds   Time 8   Period Weeks   Status Achieved     PT LONG TERM GOAL #4   Title Pt will increase 10MWT by at least 0.13 m/s in order to demonstrate clinically significant improvement in community ambulation.    Baseline 04/19/16: 0.75 m/s; 05/24/2016 .833; 06/16/16: 11.4s = 0.88 m/s   Time 8   Period Days   Status On-going     PT LONG TERM GOAL #5   Title Pt will decrease TUG to below 20 seconds in order to demonstrate decreased fall risk and improved LE strength      Baseline 04/19/16: 36.15 seconds; 05/24/16: 20sec; 06/16/16: 22.07 seconds   Status New               Plan - 08/02/16 1204    Clinical Impression Statement Patient demonstrated improved performance of purse lipped breathing with exercises requiring less frequent seated rest breaks opting for resting standing breaks. Side stepping is challenging to pt. due to weakness. Balance is impacted by patients LE weakness with LLE weaker than RLE. Balance improved with pt. focus and cueing for task orientation.  Patient required encouragement for continuation of tasks. Patient will continue to benefit from skilled physical therapy to improve safety during functional mobility   Rehab Potential Good   Clinical Impairments Affecting Rehab Potential Positive: motivation; Negative: chronic balance dysfunction, chronic CVA   PT Frequency 2x / week   PT Duration 8 weeks   PT Treatment/Interventions ADLs/Self Care Home Management;Cryotherapy;Moist Heat;Neuromuscular re-education;Gait training;Functional mobility  training;Stair training;Therapeutic activities;Therapeutic exercise;Balance training;Manual techniques;Aquatic Therapy;Canalith Repostioning;Electrical Stimulation;Traction;Ultrasound;DME Instruction;Patient/family education;Passive range of motion;Energy conservation;Vestibular   PT Next Visit Plan dynamic balance   PT Home Exercise Plan As prescribed   Consulted and Agree with Plan of Care Patient      Patient will benefit from skilled therapeutic intervention in order to improve the following deficits and impairments:  Abnormal gait, Decreased activity tolerance, Decreased balance, Decreased strength, Difficulty walking, Obesity, Decreased coordination  Visit Diagnosis: Difficulty in walking, not elsewhere classified  Muscle weakness (generalized)  Gait difficulty     Problem List Patient Active Problem List   Diagnosis Date Noted  .  Cough 03/20/2015  . Bronchitis, chronic obstructive w acute bronchitis ([place]) 03/20/2015  . Umbilical hernia without obstruction and without gangrene 01/12/2015  . Ventral hernia without obstruction or gangrene 01/02/2015   Janna Arch, PT, DPT   08/02/2016, 12:05 PM  [place] MAIN [facility] SERVICES [address] [place], [place], [address] Phone: [phone]   Fax:  [phone]  Name: Daniel Eaton. MRN: [id number] Date of Birth: 1940-10-06

## 2016-08-04 ENCOUNTER — Ambulatory Visit: Payer: PPO

## 2016-08-04 DIAGNOSIS — M6281 Muscle weakness (generalized): Secondary | ICD-10-CM

## 2016-08-04 DIAGNOSIS — R262 Difficulty in walking, not elsewhere classified: Secondary | ICD-10-CM

## 2016-08-04 NOTE — Therapy (Signed)
[place] MAIN [facility] SERVICES [address] [place], [place], [address] Phone: [phone]   Fax:  [phone]  Physical Therapy Treatment  Patient Details  Name: Daniel Eaton. MRN: [id number] Date of Birth: [birth year] Referring Provider: Dr. Ouida Sills  Encounter Date: 08/04/2016      PT End of Session - 08/04/16 1449    Visit Number 27   Number of Visits 33   Authorization Type g codes 27/10   PT Start Time 9417   PT Stop Time 1345   PT Time Calculation (min) 37 min   Equipment Utilized During Treatment Gait belt  min guard to S prn   Activity Tolerance Patient tolerated treatment well   Behavior During Therapy WFL for tasks assessed/performed      Past Medical History:  Diagnosis Date  . A-fib ([place])   . CHF (congestive heart failure) ([place])   . COPD (chronic obstructive pulmonary disease) ([place])   . Difficult intubation   . Hyperlipidemia   . Hypertension   . Hypothyroidism   . Kidney disease   . Sleep apnea   . Stroke ([place])   . Thyroid disease     Past Surgical History:  Procedure Laterality Date  . CARDIAC CATHETERIZATION    . CATARACT EXTRACTION W/ INTRAOCULAR LENS  IMPLANT, BILATERAL    . COLONOSCOPY  2008  . HERNIA REPAIR     bilateral inguinal hernia/ [place]  . HERNIA REPAIR  02/02/2015   18 x 28 cm ventral light mesh placed laparoscopically.  . TONSILLECTOMY    . UMBILICAL HERNIA REPAIR N/A 02/02/2015   Procedure: HERNIA REPAIR UMBILICAL ADULT;  Surgeon: Robert Bellow, MD;  Location: [facility] ORS;  Service: General;  Laterality: N/A;  . VENTRAL HERNIA REPAIR N/A 02/02/2015   Procedure: LAPAROSCOPIC VENTRAL HERNIA;  Surgeon: Robert Bellow, MD;  Location: [facility] ORS;  Service: General;  Laterality: N/A;  . vp shunt placement  1979    There were no vitals filed for this visit.      Subjective Assessment - 08/04/16 1448    Subjective Pt. reports feeling more steady at home when performing ADLs. Continues  to have decreased breathing abiliities due to exacerbation of COPD   Pertinent History Pt reports a history of rapid onset balance difficulty since 2014. He reports that the initial episode occurred due to improper use of diuretic with hypokalemia. One year later he had another episode with a fall. He was sent to ENT and had a VNG study which was normal. ENT also ordered a head CT and MRI. MRI showed chronic R MCA infarct. Pt comes for [employer] 2-3x/wk. He has completed pulmonary rehab at [facility] twice. Pt denies dizziness or vertigo but reports difficulty with his balance    Patient Stated Goals Improve balance so that he only needs cane or no assistive device   Currently in Pain? No/denies          [facility] PT Assessment - 08/04/16 0001      Berg Balance Test   Sit to Stand Able to stand without using hands and stabilize independently   Standing Unsupported Able to stand safely 2 minutes   Sitting with Back Unsupported but Feet Supported on Floor or Stool Able to sit safely and securely 2 minutes   Stand to Sit Sits safely with minimal use of hands   Transfers Able to transfer safely, minor use of hands   Standing Unsupported with Eyes Closed Able to stand 10  seconds with supervision   Standing Ubsupported with Feet Together Able to place feet together independently and stand 1 minute safely   From Standing, Reach Forward with Outstretched Arm Can reach forward >12 cm safely (5")   From Standing Position, Pick up Object from [place] to pick up shoe safely and easily   From Standing Position, Turn to Look Behind Over each Shoulder Looks behind from both sides and weight shifts well   Turn 360 Degrees Able to turn 360 degrees safely but slowly   Standing Unsupported, Alternately Place Feet on Step/Stool Able to complete 4 steps without aid or supervision   Standing Unsupported, One Foot in Front Able to plae foot ahead of the other independently and hold 30 seconds   Standing on One Leg Able  to lift leg independently and hold equal to or more than 3 seconds   Total Score 47        Neuro Re-ed  Berg: 47/56 Airex pad balance balloon pass 2x 2.5 min Forward lunges bosu ball 15x each leg. bosu ball toe taps 20x Tandem stance on two airex pads performing weight shifting with decreased UE assistance   TherEx Purse lipped breathing with scapular retraction Purse lipped breathing with shoulder circles  Ambulating no walker with CGA 2x30 ft Shoulder shrugs with arm rotation 20x with breathing 15x Standing abduction alternating legs with no UE assistance 20x good upright posture  Ankle pumps 20x Sit to stand throughout session with focus on LE use and decreased UE assistance     Pt. Required CGA and verbal cueing throughout session for proper body mechanics and safe mobility.    Pt. response to medical necessity:  Patient will continue to benefit from skilled physical therapy to improve safety during functional mobility         PT Long Term Goals - 06/16/16 1041      PT LONG TERM GOAL #1   Title Pt will be independent with HEP in order to improve strength and balance in order to decrease fall risk and improve function at home and work.   Time 8   Period Weeks   Status On-going     PT LONG TERM GOAL #2   Title Pt will improve BERG to 48 points in order to demonstrate clinically significant improvement in balance.    Baseline 04/19/16: 38/56; 05/24/16: 44/56 06/16/16: 43/56   Time 8   Period Weeks   Status On-going     PT LONG TERM GOAL #3   Title Pt will decrease TUG to below 25 seconds in order to demonstrate decreased fall risk and improved LE strength      Baseline 04/19/16: 36.15 seconds; 05/24/16: 20sec; 06/16/16: 22.07 seconds   Time 8   Period Weeks   Status Achieved     PT LONG TERM GOAL #4   Title Pt will increase 10MWT by at least 0.13 m/s in order to demonstrate clinically significant improvement in community ambulation.    Baseline 04/19/16: 0.75 m/s;  05/24/2016 .833; 06/16/16: 11.4s = 0.88 m/s   Time 8   Period Days   Status On-going     PT LONG TERM GOAL #5   Title Pt will decrease TUG to below 20 seconds in order to demonstrate decreased fall risk and improved LE strength      Baseline 04/19/16: 36.15 seconds; 05/24/16: 20sec; 06/16/16: 22.07 seconds   Status New               Plan -  08/04/16 1451    Clinical Impression Statement Patient has improved BERG balance score to 47/56. Pt. Static balance has improved however pt. Continues to be challenged by weight shifting tasks requiring balance. Tandem weight shifting interventions implemented to challenge pt. Patient will continue to benefit from skilled physical therapy to improve safety during functional mobility   Rehab Potential Good   Clinical Impairments Affecting Rehab Potential Positive: motivation; Negative: chronic balance dysfunction, chronic CVA   PT Frequency 2x / week   PT Duration 8 weeks   PT Treatment/Interventions ADLs/Self Care Home Management;Cryotherapy;Moist Heat;Neuromuscular re-education;Gait training;Functional mobility training;Stair training;Therapeutic activities;Therapeutic exercise;Balance training;Manual techniques;Aquatic Therapy;Canalith Repostioning;Electrical Stimulation;Traction;Ultrasound;DME Instruction;Patient/family education;Passive range of motion;Energy conservation;Vestibular   PT Next Visit Plan dynamic balance   PT Home Exercise Plan As prescribed   Consulted and Agree with Plan of Care Patient      Patient will benefit from skilled therapeutic intervention in order to improve the following deficits and impairments:  Abnormal gait, Decreased activity tolerance, Decreased balance, Decreased strength, Difficulty walking, Obesity, Decreased coordination  Visit Diagnosis: Difficulty in walking, not elsewhere classified  Muscle weakness (generalized)     Problem List Patient Active Problem List   Diagnosis Date Noted  . Cough 03/20/2015   . Bronchitis, chronic obstructive w acute bronchitis ([place]) 03/20/2015  . Umbilical hernia without obstruction and without gangrene 01/12/2015  . Ventral hernia without obstruction or gangrene 01/02/2015   Janna Arch, PT, DPT   08/04/2016, 2:56 PM  [place] MAIN [facility] SERVICES [address] [place], [place], [address] Phone: [phone]   Fax:  [phone]  Name: Daniel Eaton. MRN: [id number] Date of Birth: [birth year]

## 2016-08-09 ENCOUNTER — Ambulatory Visit: Payer: PPO

## 2016-08-09 DIAGNOSIS — R262 Difficulty in walking, not elsewhere classified: Secondary | ICD-10-CM

## 2016-08-09 DIAGNOSIS — M6281 Muscle weakness (generalized): Secondary | ICD-10-CM

## 2016-08-09 NOTE — Therapy (Signed)
[place] MAIN [facility] SERVICES [address] [place], [place], [address] Phone: [phone]   Fax:  [phone]  Physical Therapy Treatment  Patient Details  Name: Daniel Eaton. MRN: [id number] Date of Birth: [birth year] Referring Provider: Dr. Ouida Sills  Encounter Date: 08/09/2016      PT End of Session - 08/09/16 1614    Visit Number 28   Number of Visits 33   Authorization Type g codes 28/10   PT Start Time 7654   PT Stop Time 1430   PT Time Calculation (min) 43 min   Equipment Utilized During Treatment Gait belt  min guard to S prn   Activity Tolerance Patient tolerated treatment well   Behavior During Therapy WFL for tasks assessed/performed      Past Medical History:  Diagnosis Date  . A-fib ([place])   . CHF (congestive heart failure) ([place])   . COPD (chronic obstructive pulmonary disease) ([place])   . Difficult intubation   . Hyperlipidemia   . Hypertension   . Hypothyroidism   . Kidney disease   . Sleep apnea   . Stroke ([place])   . Thyroid disease     Past Surgical History:  Procedure Laterality Date  . CARDIAC CATHETERIZATION    . CATARACT EXTRACTION W/ INTRAOCULAR LENS  IMPLANT, BILATERAL    . COLONOSCOPY  2008  . HERNIA REPAIR     bilateral inguinal hernia/ [place]  . HERNIA REPAIR  02/02/2015   18 x 28 cm ventral light mesh placed laparoscopically.  . TONSILLECTOMY    . UMBILICAL HERNIA REPAIR N/A 02/02/2015   Procedure: HERNIA REPAIR UMBILICAL ADULT;  Surgeon: Robert Bellow, MD;  Location: [facility] ORS;  Service: General;  Laterality: N/A;  . VENTRAL HERNIA REPAIR N/A 02/02/2015   Procedure: LAPAROSCOPIC VENTRAL HERNIA;  Surgeon: Robert Bellow, MD;  Location: [facility] ORS;  Service: General;  Laterality: N/A;  . vp shunt placement  1979    There were no vitals filed for this visit.      Subjective Assessment - 08/09/16 1353    Subjective Pt. has been feeling depressed the past the last few days.    Pertinent  History Pt reports a history of rapid onset balance difficulty since 2014. He reports that the initial episode occurred due to improper use of diuretic with hypokalemia. One year later he had another episode with a fall. He was sent to ENT and had a VNG study which was normal. ENT also ordered a head CT and MRI. MRI showed chronic R MCA infarct. Pt comes for [employer] 2-3x/wk. He has completed pulmonary rehab at [facility] twice. Pt denies dizziness or vertigo but reports difficulty with his balance    Patient Stated Goals Improve balance so that he only needs cane or no assistive device   Currently in Pain? Yes   Pain Score 2    Pain Location Knee   Pain Orientation Left   Pain Descriptors / Indicators Aching   Pain Type Chronic pain      Neuro Re-ed Airex pad balance balloon pass 2x 2.5 min Forward lunges bosu ball 15x each leg.throwing ball into bucket at top each time bosu ball toe taps 20x Tandem stance on two airex pads performing weight shifting with no UE assist, occasional UE recenter 2x 60 seconds   TherEx Purse lipped breathing with scapular retraction Purse lipped breathing with shoulder circles  Sit to stand from raised plinth table 10x no UE assistance Ambulating no  walker with CGA 2x30 ft Shoulder shrugs with arm rotation 20x with breathing 15x Standing abduction alternating legs with no UE assistance 20x good upright posture  Standing hip extension in // bars 10x each leg Seated rocker board forwards, backwards 20x Ankle pumps 20x      Pt. Required CGA and verbal cueing throughout session for proper body mechanics and safe mobility.    Pt. response to medical necessity:  Patient will continue to benefit from skilled physical therapy to improve safety during functional mobility            PT Long Term Goals - 06/16/16 1041      PT LONG TERM GOAL #1   Title Pt will be independent with HEP in order to improve strength and balance in order to decrease fall risk  and improve function at home and work.   Time 8   Period Weeks   Status On-going     PT LONG TERM GOAL #2   Title Pt will improve BERG to 48 points in order to demonstrate clinically significant improvement in balance.    Baseline 04/19/16: 38/56; 05/24/16: 44/56 06/16/16: 43/56   Time 8   Period Weeks   Status On-going     PT LONG TERM GOAL #3   Title Pt will decrease TUG to below 25 seconds in order to demonstrate decreased fall risk and improved LE strength      Baseline 04/19/16: 36.15 seconds; 05/24/16: 20sec; 06/16/16: 22.07 seconds   Time 8   Period Weeks   Status Achieved     PT LONG TERM GOAL #4   Title Pt will increase 10MWT by at least 0.13 m/s in order to demonstrate clinically significant improvement in community ambulation.    Baseline 04/19/16: 0.75 m/s; 05/24/2016 .833; 06/16/16: 11.4s = 0.88 m/s   Time 8   Period Days   Status On-going     PT LONG TERM GOAL #5   Title Pt will decrease TUG to below 20 seconds in order to demonstrate decreased fall risk and improved LE strength      Baseline 04/19/16: 36.15 seconds; 05/24/16: 20sec; 06/16/16: 22.07 seconds   Status New               Plan - 08/09/16 1617    Clinical Impression Statement Patient functional strength progressing to performing 10 sit to stands in a row without UE support. Less frequent seated breaks were required between interventions demonstrated an improved capacity for functional movement. Pt. Is challenged by dynamic balance activities that require UE involvement. Patient will continue to benefit from skilled physical therapy to improve safety during functional mobility   Rehab Potential Good   Clinical Impairments Affecting Rehab Potential Positive: motivation; Negative: chronic balance dysfunction, chronic CVA   PT Frequency 2x / week   PT Duration 8 weeks   PT Treatment/Interventions ADLs/Self Care Home Management;Cryotherapy;Moist Heat;Neuromuscular re-education;Gait training;Functional mobility  training;Stair training;Therapeutic activities;Therapeutic exercise;Balance training;Manual techniques;Aquatic Therapy;Canalith Repostioning;Electrical Stimulation;Traction;Ultrasound;DME Instruction;Patient/family education;Passive range of motion;Energy conservation;Vestibular   PT Next Visit Plan dynamic balance   PT Home Exercise Plan As prescribed   Consulted and Agree with Plan of Care Patient      Patient will benefit from skilled therapeutic intervention in order to improve the following deficits and impairments:  Abnormal gait, Decreased activity tolerance, Decreased balance, Decreased strength, Difficulty walking, Obesity, Decreased coordination  Visit Diagnosis: Difficulty in walking, not elsewhere classified  Muscle weakness (generalized)     Problem List Patient Active Problem List  Diagnosis Date Noted  . Cough 03/20/2015  . Bronchitis, chronic obstructive w acute bronchitis ([place]) 03/20/2015  . Umbilical hernia without obstruction and without gangrene 01/12/2015  . Ventral hernia without obstruction or gangrene 01/02/2015   Janna Arch, PT, DPT   08/09/2016, 4:18 PM  [place] MAIN [facility] SERVICES [address] [place], [place], [address] Phone: [phone]   Fax:  [phone]  Name: Karlo Goeden. MRN: [id number] Date of Birth: 26-Aug-1940

## 2016-08-11 ENCOUNTER — Ambulatory Visit: Payer: PPO

## 2016-08-11 DIAGNOSIS — M6281 Muscle weakness (generalized): Secondary | ICD-10-CM

## 2016-08-11 DIAGNOSIS — R262 Difficulty in walking, not elsewhere classified: Secondary | ICD-10-CM | POA: Diagnosis not present

## 2016-08-11 NOTE — Therapy (Signed)
[place] MAIN [facility] SERVICES [address] [place] [place], [place], [address] Phone: [phone]   Fax:  [phone]  Physical Therapy Treatment  Patient Details  Name: Daniel Eaton. MRN: [id number] Date of Birth: 1941/01/10 Referring Provider: Dr. Ouida Sills  Encounter Date: 08/11/2016      PT End of Session - 08/11/16 1348    Visit Number 29   Number of Visits 33   Authorization Type g codes 29/10   PT Start Time 6294   PT Stop Time 1346   PT Time Calculation (min) 43 min   Equipment Utilized During Treatment Gait belt  min guard to S prn   Activity Tolerance Patient tolerated treatment well   Behavior During Therapy WFL for tasks assessed/performed      Past Medical History:  Diagnosis Date  . A-fib ([place])   . CHF (congestive heart failure) ([place])   . COPD (chronic obstructive pulmonary disease) ([place])   . Difficult intubation   . Hyperlipidemia   . Hypertension   . Hypothyroidism   . Kidney disease   . Sleep apnea   . Stroke ([place])   . Thyroid disease     Past Surgical History:  Procedure Laterality Date  . CARDIAC CATHETERIZATION    . CATARACT EXTRACTION W/ INTRAOCULAR LENS  IMPLANT, BILATERAL    . COLONOSCOPY  2008  . HERNIA REPAIR     bilateral inguinal hernia/ [facility]  . HERNIA REPAIR  02/02/2015   18 x 28 cm ventral light mesh placed laparoscopically.  . TONSILLECTOMY    . UMBILICAL HERNIA REPAIR N/A 02/02/2015   Procedure: HERNIA REPAIR UMBILICAL ADULT;  Surgeon: Robert Bellow, MD;  Location: [facility] ORS;  Service: General;  Laterality: N/A;  . VENTRAL HERNIA REPAIR N/A 02/02/2015   Procedure: LAPAROSCOPIC VENTRAL HERNIA;  Surgeon: Robert Bellow, MD;  Location: [facility] ORS;  Service: General;  Laterality: N/A;  . vp shunt placement  1979    There were no vitals filed for this visit.      Subjective Assessment - 08/11/16 1309    Subjective pt. has been feeling sore in his knees and back. He has ubered the other  day.    Pertinent History Pt reports a history of rapid onset balance difficulty since 2014. He reports that the initial episode occurred due to improper use of diuretic with hypokalemia. One year later he had another episode with a fall. He was sent to ENT and had a VNG study which was normal. ENT also ordered a head CT and MRI. MRI showed chronic R MCA infarct. Pt comes for [employer] 2-3x/wk. He has completed pulmonary rehab at [facility] twice. Pt denies dizziness or vertigo but reports difficulty with his balance    Patient Stated Goals Improve balance so that he only needs cane or no assistive device   Currently in Pain? Yes   Pain Score 3    Pain Location Knee   Pain Orientation Right;Left     Neuro Re-ed Airex pad balance balloon pass 2x 2.5 min Airex balance beam side steps 4x length of bars Airex balance beam balance 60 sec Forward lunge yellow disc 10x each side  Forward lunges yellow disc Tandem stance on two airex pads performing weight shifting with no UE assist, occasional UE recenter 2x 60 seconds Step over and back over half foam roller, painful step after number 10 with left knee, required rest break.    TherEx Purse lipped breathing with scapular retraction Purse  lipped breathing with shoulder circles  Sit to stand from raised plinth table 10x no UE assistance Ambulating no walker with CGA 2x40 ft Shoulder shrugs with arm rotation 20x with breathing 15x Standing abduction alternating legs with no UE assistance 20x good upright posture  Seated abduction green theraband 20x Seated rocker board forwards, backwards 20x Toe taps 6" step, stopped d/t knee pain Ankle pumps 20x       Pt. Required CGA and verbal cueing throughout session for proper body mechanics and safe mobility.    Pt. response to medical necessity: . Patient will continue to benefit from skilled physical therapy to improve safety during functional mobility.                            PT Education - 08/11/16 1346    Education provided Yes   Education Details sitting down when knees pop painfully and stretch   Person(s) Educated Patient   Methods Explanation   Comprehension Verbalized understanding             PT Long Term Goals - 06/16/16 1041      PT LONG TERM GOAL #1   Title Pt will be independent with HEP in order to improve strength and balance in order to decrease fall risk and improve function at home and work.   Time 8   Period Weeks   Status On-going     PT LONG TERM GOAL #2   Title Pt will improve BERG to 48 points in order to demonstrate clinically significant improvement in balance.    Baseline 04/19/16: 38/56; 05/24/16: 44/56 06/16/16: 43/56   Time 8   Period Weeks   Status On-going     PT LONG TERM GOAL #3   Title Pt will decrease TUG to below 25 seconds in order to demonstrate decreased fall risk and improved LE strength      Baseline 04/19/16: 36.15 seconds; 05/24/16: 20sec; 06/16/16: 22.07 seconds   Time 8   Period Weeks   Status Achieved     PT LONG TERM GOAL #4   Title Pt will increase 10MWT by at least 0.13 m/s in order to demonstrate clinically significant improvement in community ambulation.    Baseline 04/19/16: 0.75 m/s; 05/24/2016 .833; 06/16/16: 11.4s = 0.88 m/s   Time 8   Period Days   Status On-going     PT LONG TERM GOAL #5   Title Pt will decrease TUG to below 20 seconds in order to demonstrate decreased fall risk and improved LE strength      Baseline 04/19/16: 36.15 seconds; 05/24/16: 20sec; 06/16/16: 22.07 seconds   Status New               Plan - 08/11/16 1350    Clinical Impression Statement Patient having occasional Left knee pain when stepping. Required seated rest and education on stretching after stepping over foam roller. Seated exercises implemented until reduction of pain. Patient will continue to benefit from skilled physical therapy to improve safety during functional mobility.    Rehab Potential Good   Clinical  Impairments Affecting Rehab Potential Positive: motivation; Negative: chronic balance dysfunction, chronic CVA   PT Frequency 2x / week   PT Duration 8 weeks   PT Treatment/Interventions ADLs/Self Care Home Management;Cryotherapy;Moist Heat;Neuromuscular re-education;Gait training;Functional mobility training;Stair training;Therapeutic activities;Therapeutic exercise;Balance training;Manual techniques;Aquatic Therapy;Canalith Repostioning;Electrical Stimulation;Traction;Ultrasound;DME Instruction;Patient/family education;Passive range of motion;Energy conservation;Vestibular   PT Next Visit Plan decreased to 1x/week, balance, HEP  PT Home Exercise Plan As prescribed   Consulted and Agree with Plan of Care Patient      Patient will benefit from skilled therapeutic intervention in order to improve the following deficits and impairments:  Abnormal gait, Decreased activity tolerance, Decreased balance, Decreased strength, Difficulty walking, Obesity, Decreased coordination  Visit Diagnosis: Difficulty in walking, not elsewhere classified  Muscle weakness (generalized)     Problem List Patient Active Problem List   Diagnosis Date Noted  . Cough 03/20/2015  . Bronchitis, chronic obstructive w acute bronchitis ([place]) 03/20/2015  . Umbilical hernia without obstruction and without gangrene 01/12/2015  . Ventral hernia without obstruction or gangrene 01/02/2015   Janna Arch, PT, DPT   08/11/2016, 1:54 PM  [place] MAIN [facility] SERVICES [address] [place], [place], [address] Phone: [phone]   Fax:  [phone]  Name: Daniel Eaton. MRN: [id number] Date of Birth: [birth year]

## 2016-08-15 DIAGNOSIS — M9903 Segmental and somatic dysfunction of lumbar region: Secondary | ICD-10-CM | POA: Diagnosis not present

## 2016-08-15 DIAGNOSIS — M9908 Segmental and somatic dysfunction of rib cage: Secondary | ICD-10-CM | POA: Diagnosis not present

## 2016-08-15 DIAGNOSIS — M9904 Segmental and somatic dysfunction of sacral region: Secondary | ICD-10-CM | POA: Diagnosis not present

## 2016-08-15 DIAGNOSIS — M9902 Segmental and somatic dysfunction of thoracic region: Secondary | ICD-10-CM | POA: Diagnosis not present

## 2016-08-16 ENCOUNTER — Ambulatory Visit: Payer: PPO | Attending: Internal Medicine

## 2016-08-16 DIAGNOSIS — R269 Unspecified abnormalities of gait and mobility: Secondary | ICD-10-CM | POA: Diagnosis not present

## 2016-08-16 DIAGNOSIS — R262 Difficulty in walking, not elsewhere classified: Secondary | ICD-10-CM | POA: Diagnosis not present

## 2016-08-16 DIAGNOSIS — M6281 Muscle weakness (generalized): Secondary | ICD-10-CM

## 2016-08-16 NOTE — Therapy (Signed)
[place] MAIN [facility] SERVICES [address] [place], [place], [address] Phone: [phone]   Fax:  [phone]  Physical Therapy Treatment  Patient Details  Name: Daniel Eaton. MRN: [id number] Date of Birth: 1940/07/19 Referring Provider: Dr. Ouida Sills  Encounter Date: 08/16/2016      PT End of Session - 08/16/16 2259    Visit Number 30   Number of Visits 43   Authorization Type g codes 30/10 next will be (1/10)   PT Start Time 1430   PT Stop Time 1515   PT Time Calculation (min) 45 min   Equipment Utilized During Treatment Gait belt  min guard to S prn   Activity Tolerance Patient tolerated treatment well;Patient limited by fatigue   Behavior During Therapy WFL for tasks assessed/performed      Past Medical History:  Diagnosis Date  . A-fib ([place])   . CHF (congestive heart failure) ([place])   . COPD (chronic obstructive pulmonary disease) ([place])   . Difficult intubation   . Hyperlipidemia   . Hypertension   . Hypothyroidism   . Kidney disease   . Sleep apnea   . Stroke ([place])   . Thyroid disease     Past Surgical History:  Procedure Laterality Date  . CARDIAC CATHETERIZATION    . CATARACT EXTRACTION W/ INTRAOCULAR LENS  IMPLANT, BILATERAL    . COLONOSCOPY  2008  . HERNIA REPAIR     bilateral inguinal hernia/ [facility]  . HERNIA REPAIR  02/02/2015   18 x 28 cm ventral light mesh placed laparoscopically.  . TONSILLECTOMY    . UMBILICAL HERNIA REPAIR N/A 02/02/2015   Procedure: HERNIA REPAIR UMBILICAL ADULT;  Surgeon: Robert Bellow, MD;  Location: [facility] ORS;  Service: General;  Laterality: N/A;  . VENTRAL HERNIA REPAIR N/A 02/02/2015   Procedure: LAPAROSCOPIC VENTRAL HERNIA;  Surgeon: Robert Bellow, MD;  Location: [facility] ORS;  Service: General;  Laterality: N/A;  . vp shunt placement  1979    There were no vitals filed for this visit.      Subjective Assessment - 08/16/16 1433    Subjective Patient is having  increased bilateral knee pain limiting his ability to stand and walk. Pt. says it is easier now to brush his teeth at sink  and do basic ADLs   Pertinent History Pt reports a history of rapid onset balance difficulty since 2014. He reports that the initial episode occurred due to improper use of diuretic with hypokalemia. One year later he had another episode with a fall. He was sent to ENT and had a VNG study which was normal. ENT also ordered a head CT and MRI. MRI showed chronic R MCA infarct. Pt comes for [employer] 2-3x/wk. He has completed pulmonary rehab at [facility] twice. Pt denies dizziness or vertigo but reports difficulty with his balance    Patient Stated Goals Improve balance so that he only needs cane or no assistive device   Currently in Pain? Yes   Pain Score 6    Pain Location Knee   Pain Orientation Right;Left   Pain Descriptors / Indicators Aching;Contraction   Pain Type Chronic pain            [facility] PT Assessment - 08/16/16 0001      Standardized Balance Assessment   Standardized Balance Assessment Dynamic Gait Index     Berg Balance Test   Sit to Stand Able to stand without using hands and stabilize independently  Standing Unsupported Able to stand safely 2 minutes   Sitting with Back Unsupported but Feet Supported on Floor or Stool Able to sit safely and securely 2 minutes   Stand to Sit Sits safely with minimal use of hands   Transfers Able to transfer safely, minor use of hands   Standing Unsupported with Eyes Closed Able to stand 10 seconds safely   Standing Ubsupported with Feet Together Able to place feet together independently and stand 1 minute safely   From Standing, Reach Forward with Outstretched Arm Can reach forward >12 cm safely (5")   From Standing Position, Pick up Object from Floor Able to pick up shoe safely and easily   From Standing Position, Turn to Look Behind Over each Shoulder Looks behind one side only/other side shows less weight shift    Turn 360 Degrees Able to turn 360 degrees safely but slowly   Standing Unsupported, Alternately Place Feet on Step/Stool Able to complete 4 steps without aid or supervision   Standing Unsupported, One Foot in Front Able to plae foot ahead of the other independently and hold 30 seconds   Standing on One Leg Able to lift leg independently and hold equal to or more than 3 seconds   Total Score 47     Dynamic Gait Index   Level Surface Mild Impairment   Change in Gait Speed Normal   Gait with Horizontal Head Turns Mild Impairment   Gait with Vertical Head Turns Mild Impairment   Gait and Pivot Turn Mild Impairment   Step Over Obstacle Moderate Impairment   Step Around Obstacles Moderate Impairment   Steps Moderate Impairment   Total Score 14     Functional Gait  Assessment   Gait assessed  No     BERG 47/56 10 MWT: with walker: 15, without walker 13 DGI: 14   TherEx Seated hip abduction blue theraband with scapular retractions simultaneously x 15 Seated hip abduction blue theraband with five second holds at end range to increase functional capacity x 12 Seated hip adduction with PT resistance 12x Seated marches 15x  Patient demonstrating progression of balance and improved Berg 47/56. Velocity of 10 MWT is faster without walker and pt. Has demonstrated ability to perform short distances with decreased need for assistive device. DGI 14 with pt. Challenged by tasks requiring turning head or negotiating obstacles. Patient will continue to benefit from skilled physical therapy to improve safety during functional mobility and allow pt. To ambulate with least assistive device.         PT Long Term Goals - 08/16/16 2302      PT LONG TERM GOAL #1   Title Pt will be independent with HEP in order to improve strength and balance in order to decrease fall risk and improve function at home and work.   Time 8   Period Weeks   Status On-going     PT LONG TERM GOAL #2   Title Pt will  improve BERG to 48 points in order to demonstrate clinically significant improvement in balance.    Baseline 04/19/16: 38/56; 05/24/16: 44/56 06/16/16: 43/56 08/16/16: 47/56   Time 8   Period Weeks   Status On-going     PT LONG TERM GOAL #3   Title Pt will decrease TUG to below 25 seconds in order to demonstrate decreased fall risk and improved LE strength      Baseline 04/19/16: 36.15 seconds; 05/24/16: 20sec; 06/16/16: 22.07 seconds   Time 8   Period  Weeks   Status Achieved     PT LONG TERM GOAL #4   Title Pt will increase 10MWT by at least 0.13 m/s in order to demonstrate clinically significant improvement in community ambulation.    Baseline 04/19/16: 0.75 m/s; 05/24/2016 .833; 06/16/16: 11.4s = 0.88 m/s 06-Sep-2022: 13 m =.49m/s   Time 8   Period Days   Status On-going     PT LONG TERM GOAL #5   Title Pt will decrease TUG to below 20 seconds in order to demonstrate decreased fall risk and improved LE strength      Baseline 04/19/16: 36.15 seconds; 05/24/16: 20sec; 06/16/16: 22.07 seconds   Status New     Additional Long Term Goals   Additional Long Term Goals Yes     PT LONG TERM GOAL #6   Title Patient will increase dynamic gait index score to >19/24 as to demonstrate reduced fall risk and improved dynamic gait balance for better safety with community/home ambulation.    Baseline 2022-09-06: 14   Time 6   Period Weeks   Status New               Plan - 2016-09-05 06/02/00    Clinical Impression Statement Patient demonstrating progression of balance and improved Berg 47/56. Velocity of 10 MWT is faster without walker and pt. Has demonstrated ability to perform short distances with decreased need for assistive device. DGI 14 with pt. Challenged by tasks requiring turning head or negotiating obstacles. Patient will continue to benefit from skilled physical therapy to improve safety during functional mobility and allow pt. To ambulate with least assistive device.    Rehab Potential Good   Clinical Impairments  Affecting Rehab Potential Positive: motivation; Negative: chronic balance dysfunction, chronic CVA   PT Frequency 1x / week   PT Duration 8 weeks   PT Treatment/Interventions ADLs/Self Care Home Management;Cryotherapy;Moist Heat;Neuromuscular re-education;Gait training;Functional mobility training;Stair training;Therapeutic activities;Therapeutic exercise;Balance training;Manual techniques;Aquatic Therapy;Canalith Repostioning;Electrical Stimulation;Traction;Ultrasound;DME Instruction;Patient/family education;Passive range of motion;Energy conservation;Vestibular   PT Next Visit Plan decreased to 1x/week, balance, HEP, walk with cane   PT Home Exercise Plan As prescribed   Consulted and Agree with Plan of Care Patient      Patient will benefit from skilled therapeutic intervention in order to improve the following deficits and impairments:  Abnormal gait, Decreased activity tolerance, Decreased balance, Decreased strength, Difficulty walking, Obesity, Decreased coordination  Visit Diagnosis: Difficulty in walking, not elsewhere classified  Muscle weakness (generalized)       G-Codes - Sep 05, 2016 06/03/03    Functional Assessment Tool Used (Outpatient Only) BERG, 10MWT, DGI, clinical judgement   Functional Limitation Mobility: Walking and moving around   Mobility: Walking and Moving Around Current Status [phone]) At least 40 percent but less than 60 percent impaired, limited or restricted   Mobility: Walking and Moving Around Goal Status [phone]) At least 20 percent but less than 40 percent impaired, limited or restricted      Problem List Patient Active Problem List   Diagnosis Date Noted  . Cough 03/20/2015  . Bronchitis, chronic obstructive w acute bronchitis ([place]) 03/20/2015  . Umbilical hernia without obstruction and without gangrene 01/12/2015  . Ventral hernia without obstruction or gangrene 01/02/2015   Janna Arch, PT, DPT   Janna Arch September 05, 2016, 11:07 PM  [place] MAIN [facility] SERVICES [address] [place], [place], [address] Phone: [phone]   Fax:  [phone]  Name: Daniel Eaton. MRN: [id number] Date of Birth: [birth year]

## 2016-08-23 ENCOUNTER — Ambulatory Visit: Payer: PPO

## 2016-08-23 DIAGNOSIS — R262 Difficulty in walking, not elsewhere classified: Secondary | ICD-10-CM | POA: Diagnosis not present

## 2016-08-23 DIAGNOSIS — M6281 Muscle weakness (generalized): Secondary | ICD-10-CM

## 2016-08-23 NOTE — Patient Instructions (Signed)
HEP: Scapular retractions Ankle pumps Tandem stance with support Hip abduction standing Standing marches Standing heel raises Hip extension  Seated hamstring stretch

## 2016-08-23 NOTE — Therapy (Signed)
[place] MAIN [facility] SERVICES [address] [place], [place], [address] Phone: [phone]   Fax:  [phone]  Physical Therapy Treatment  Patient Details  Name: Daniel Eaton. MRN: [id number] Date of Birth: 1940-09-20 Referring Provider: Dr. Ouida Sills  Encounter Date: 08/23/2016      PT End of Session - 08/23/16 1119    Visit Number 31   Number of Visits 43   Authorization Type 1/10   PT Start Time 7858   PT Stop Time 1116   PT Time Calculation (min) 41 min   Equipment Utilized During Treatment Gait belt  min guard to S prn   Activity Tolerance Patient tolerated treatment well;Patient limited by fatigue;Patient limited by pain   Behavior During Therapy WFL for tasks assessed/performed      Past Medical History:  Diagnosis Date  . A-fib ([place])   . CHF (congestive heart failure) ([place])   . COPD (chronic obstructive pulmonary disease) ([place])   . Difficult intubation   . Hyperlipidemia   . Hypertension   . Hypothyroidism   . Kidney disease   . Sleep apnea   . Stroke ([place])   . Thyroid disease     Past Surgical History:  Procedure Laterality Date  . CARDIAC CATHETERIZATION    . CATARACT EXTRACTION W/ INTRAOCULAR LENS  IMPLANT, BILATERAL    . COLONOSCOPY  2008  . HERNIA REPAIR     bilateral inguinal hernia/ [place]  . HERNIA REPAIR  02/02/2015   18 x 28 cm ventral light mesh placed laparoscopically.  . TONSILLECTOMY    . UMBILICAL HERNIA REPAIR N/A 02/02/2015   Procedure: HERNIA REPAIR UMBILICAL ADULT;  Surgeon: Robert Bellow, MD;  Location: [facility] ORS;  Service: General;  Laterality: N/A;  . VENTRAL HERNIA REPAIR N/A 02/02/2015   Procedure: LAPAROSCOPIC VENTRAL HERNIA;  Surgeon: Robert Bellow, MD;  Location: [facility] ORS;  Service: General;  Laterality: N/A;  . vp shunt placement  1979    There were no vitals filed for this visit.      Subjective Assessment - 08/23/16 1043    Subjective Patient went to heart track  last week and did seated elliptical 25 minutes.    Pertinent History Pt reports a history of rapid onset balance difficulty since 2014. He reports that the initial episode occurred due to improper use of diuretic with hypokalemia. One year later he had another episode with a fall. He was sent to ENT and had a VNG study which was normal. ENT also ordered a head CT and MRI. MRI showed chronic R MCA infarct. Pt comes for [employer] 2-3x/wk. He has completed pulmonary rehab at [facility] twice. Pt denies dizziness or vertigo but reports difficulty with his balance    Patient Stated Goals Improve balance so that he only needs cane or no assistive device   Currently in Pain? Yes   Pain Score 4    Pain Location Knee   Pain Orientation Left   Pain Descriptors / Indicators Sore   Pain Type Chronic pain   Pain Onset More than a month ago   Pain Frequency Intermittent      Neuro Re-ed Tandem stance with support 20 sec x 2 Airex pad balance 2x60 seconds Tandem stance on two airex pads performing weight shifting with no UE assist, occasional UE recenter 2x 60 seconds airex lunges 10x   TherEx Purse lipped breathing with scapular retraction Purse lipped breathing with shoulder circles  Sit to stand from  raised plinth table 10x no UE assistance Ambulating no walker with CGA 2x40 ft Scapular retractions 15x Ankle pumps 20x Hip abduction standing 10x each side decreasing UE assistance Standing marches, 10x, painful so terminated Standing heel raises, 15x Hip extension x10 each leg Seated hamstring stretch x 20 seconds each leg   Walking with a cane in // bars 2x, and outside // bars Ambulating outside // bars with SPC, catching breathe due to exacerbation of COPD         Pt. Required CGA and verbal cueing throughout session for proper body mechanics and safe mobility.    Pt. response to medical necessity: . Patient will continue to benefit from skilled physical therapy to improve safety during  functional mobility.        PT Education - 08/23/16 1118    Education provided Yes   Education Details new HEP, walking with cane   Person(s) Educated Patient   Methods Explanation;Verbal cues;Demonstration;Handout   Comprehension Verbalized understanding;Returned demonstration             PT Long Term Goals - 08/16/16 2302      PT LONG TERM GOAL #1   Title Pt will be independent with HEP in order to improve strength and balance in order to decrease fall risk and improve function at home and work.   Time 8   Period Weeks   Status On-going     PT LONG TERM GOAL #2   Title Pt will improve BERG to 48 points in order to demonstrate clinically significant improvement in balance.    Baseline 04/19/16: 38/56; 05/24/16: 44/56 06/16/16: 43/56 08/16/16: 47/56   Time 8   Period Weeks   Status On-going     PT LONG TERM GOAL #3   Title Pt will decrease TUG to below 25 seconds in order to demonstrate decreased fall risk and improved LE strength      Baseline 04/19/16: 36.15 seconds; 05/24/16: 20sec; 06/16/16: 22.07 seconds   Time 8   Period Weeks   Status Achieved     PT LONG TERM GOAL #4   Title Pt will increase 10MWT by at least 0.13 m/s in order to demonstrate clinically significant improvement in community ambulation.    Baseline 04/19/16: 0.75 m/s; 05/24/2016 .833; 06/16/16: 11.4s = 0.88 m/s 7/3: 13 m =.60m/s   Time 8   Period Days   Status On-going     PT LONG TERM GOAL #5   Title Pt will decrease TUG to below 20 seconds in order to demonstrate decreased fall risk and improved LE strength      Baseline 04/19/16: 36.15 seconds; 05/24/16: 20sec; 06/16/16: 22.07 seconds   Status New     Additional Long Term Goals   Additional Long Term Goals Yes     PT LONG TERM GOAL #6   Title Patient will increase dynamic gait index score to >19/24 as to demonstrate reduced fall risk and improved dynamic gait balance for better safety with community/home ambulation.    Baseline 7/3: 14   Time 6   Period  Weeks   Status New               Plan - 08/23/16 1222    Clinical Impression Statement Patient continues to be limited by left knee pain and swelling. Pain exacerbated by weight bearing activities. Pt. Demonstrated safe ambulation with SPC but required frequent rest breaks due to limited breathing. Pt. Demonstrated understanding of new HEP with safe performance of interventions.  Patient  will continue to benefit from skilled physical therapy to improve safety during functional mobility.   Rehab Potential Good   Clinical Impairments Affecting Rehab Potential Positive: motivation; Negative: chronic balance dysfunction, chronic CVA   PT Frequency 1x / week   PT Duration 8 weeks   PT Treatment/Interventions ADLs/Self Care Home Management;Cryotherapy;Moist Heat;Neuromuscular re-education;Gait training;Functional mobility training;Stair training;Therapeutic activities;Therapeutic exercise;Balance training;Manual techniques;Aquatic Therapy;Canalith Repostioning;Electrical Stimulation;Traction;Ultrasound;DME Instruction;Patient/family education;Passive range of motion;Energy conservation;Vestibular   PT Next Visit Plan walk with cane, HEP, progress to more heart track.    PT Home Exercise Plan As prescribed   Consulted and Agree with Plan of Care Patient      Patient will benefit from skilled therapeutic intervention in order to improve the following deficits and impairments:  Abnormal gait, Decreased activity tolerance, Decreased balance, Decreased strength, Difficulty walking, Obesity, Decreased coordination  Visit Diagnosis: Difficulty in walking, not elsewhere classified  Muscle weakness (generalized)     Problem List Patient Active Problem List   Diagnosis Date Noted  . Cough 03/20/2015  . Bronchitis, chronic obstructive w acute bronchitis ([place]) 03/20/2015  . Umbilical hernia without obstruction and without gangrene 01/12/2015  . Ventral hernia without obstruction or gangrene  01/02/2015   Janna Arch, PT, DPT   Janna Arch 08/23/2016, 12:24 PM  [place] MAIN [facility] SERVICES [address] [place], [place], [address] Phone: [phone]   Fax:  [phone]  Name: Daniel Eaton. MRN: [id number] Date of Birth: 04/15/1940

## 2016-08-25 ENCOUNTER — Ambulatory Visit: Payer: PPO

## 2016-08-30 ENCOUNTER — Ambulatory Visit: Payer: PPO

## 2016-08-30 DIAGNOSIS — R262 Difficulty in walking, not elsewhere classified: Secondary | ICD-10-CM

## 2016-08-30 DIAGNOSIS — M6281 Muscle weakness (generalized): Secondary | ICD-10-CM

## 2016-08-30 NOTE — Therapy (Signed)
[place] MAIN [facility] SERVICES [address] [place], [place], [address] Phone: [phone]   Fax:  [phone]  Physical Therapy Treatment  Patient Details  Name: Daniel Eaton. MRN: [id number] Date of Birth: December 19, 1940 Referring Provider: Dr. Ouida Sills  Encounter Date: 08/30/2016      PT End of Session - 08/30/16 1203    Visit Number 32   Number of Visits 43   Authorization Type 2/10   PT Start Time 1117   PT Stop Time 1201   PT Time Calculation (min) 44 min   Equipment Utilized During Treatment Gait belt  min guard to S prn   Activity Tolerance Patient tolerated treatment well;Patient limited by pain   Behavior During Therapy WFL for tasks assessed/performed      Past Medical History:  Diagnosis Date  . A-fib ([place])   . CHF (congestive heart failure) ([place])   . COPD (chronic obstructive pulmonary disease) ([place])   . Difficult intubation   . Hyperlipidemia   . Hypertension   . Hypothyroidism   . Kidney disease   . Sleep apnea   . Stroke ([place])   . Thyroid disease     Past Surgical History:  Procedure Laterality Date  . CARDIAC CATHETERIZATION    . CATARACT EXTRACTION W/ INTRAOCULAR LENS  IMPLANT, BILATERAL    . COLONOSCOPY  2008  . HERNIA REPAIR     bilateral inguinal hernia/ [place]  . HERNIA REPAIR  02/02/2015   18 x 28 cm ventral light mesh placed laparoscopically.  . TONSILLECTOMY    . UMBILICAL HERNIA REPAIR N/A 02/02/2015   Procedure: HERNIA REPAIR UMBILICAL ADULT;  Surgeon: Robert Bellow, MD;  Location: [facility] ORS;  Service: General;  Laterality: N/A;  . VENTRAL HERNIA REPAIR N/A 02/02/2015   Procedure: LAPAROSCOPIC VENTRAL HERNIA;  Surgeon: Robert Bellow, MD;  Location: [facility] ORS;  Service: General;  Laterality: N/A;  . vp shunt placement  1979    There were no vitals filed for this visit.      Subjective Assessment - 08/30/16 1121    Subjective Patient did uber all weekend and is feeling more stiff.     Pertinent History Pt reports a history of rapid onset balance difficulty since 2014. He reports that the initial episode occurred due to improper use of diuretic with hypokalemia. One year later he had another episode with a fall. He was sent to ENT and had a VNG study which was normal. ENT also ordered a head CT and MRI. MRI showed chronic R MCA infarct. Pt comes for [employer] 2-3x/wk. He has completed pulmonary rehab at [facility] twice. Pt denies dizziness or vertigo but reports difficulty with his balance    Patient Stated Goals Improve balance so that he only needs cane or no assistive device   Currently in Pain? Yes   Pain Score 3    Pain Location Knee   Pain Orientation Right;Left   Pain Descriptors / Indicators Sore   Pain Type Chronic pain   Pain Onset More than a month ago     Neuro Re-ed Tandem stance with support 20 sec x 2 Airex pad balance 2x60 seconds with UE raises,  airex pad balance horizontal head turns 2x45 seconds , verticle head nods 1x60 seconds    TherEx Purse lipped breathing with scapular retraction Purse lipped breathing with shoulder circles  Scapular retractions 15x Ankle pumps 20x Hip abduction standing 10x each side no UE support  Standing marches, 10x,  painful so terminated Standing heel raises, 15x Hip extension standing x10 each leg, no UE support Seated hamstring stretch x 60 seconds each leg Seated adduction green ball x15 with 5 second squeezes Seated abduction with blue theraband and scapular retractions x15  Walking with a cane in // bars 2x, and outside // bars            Pt. Required CGA and verbal cueing throughout session for proper body mechanics and safe mobility.    Pt. response to medical necessity: . Patient will continue to benefit from skilled physical therapy to improve safety during functional mobility.        PT Education - 08/30/16 1203    Education provided Yes   Education Details standing body mechanics   Person(s)  Educated Patient   Methods Explanation   Comprehension Verbalized understanding;Returned demonstration             PT Long Term Goals - 08/16/16 2302      PT LONG TERM GOAL #1   Title Pt will be independent with HEP in order to improve strength and balance in order to decrease fall risk and improve function at home and work.   Time 8   Period Weeks   Status On-going     PT LONG TERM GOAL #2   Title Pt will improve BERG to 48 points in order to demonstrate clinically significant improvement in balance.    Baseline 04/19/16: 38/56; 05/24/16: 44/56 06/16/16: 43/56 08/16/16: 47/56   Time 8   Period Weeks   Status On-going     PT LONG TERM GOAL #3   Title Pt will decrease TUG to below 25 seconds in order to demonstrate decreased fall risk and improved LE strength      Baseline 04/19/16: 36.15 seconds; 05/24/16: 20sec; 06/16/16: 22.07 seconds   Time 8   Period Weeks   Status Achieved     PT LONG TERM GOAL #4   Title Pt will increase 10MWT by at least 0.13 m/s in order to demonstrate clinically significant improvement in community ambulation.    Baseline 04/19/16: 0.75 m/s; 05/24/2016 .833; 06/16/16: 11.4s = 0.88 m/s 7/3: 13 m =.65m/s   Time 8   Period Days   Status On-going     PT LONG TERM GOAL #5   Title Pt will decrease TUG to below 20 seconds in order to demonstrate decreased fall risk and improved LE strength      Baseline 04/19/16: 36.15 seconds; 05/24/16: 20sec; 06/16/16: 22.07 seconds   Status New     Additional Long Term Goals   Additional Long Term Goals Yes     PT LONG TERM GOAL #6   Title Patient will increase dynamic gait index score to >19/24 as to demonstrate reduced fall risk and improved dynamic gait balance for better safety with community/home ambulation.    Baseline 7/3: 14   Time 6   Period Weeks   Status New               Plan - 08/30/16 1205    Clinical Impression Statement Patient presented with increased trunk flexion from excessive sitting this weekend.  Decreased need for UE assistance with dynamic movements in standing. Longer duration of standing tolerated throughout session. Bilateral knee pain limited some weight bearing activities and resulted in abnormal balance reactionsPatient will continue to benefit from skilled physical therapy to improve safety during functional mobility..    Rehab Potential Good   Clinical Impairments Affecting Rehab Potential  Positive: motivation; Negative: chronic balance dysfunction, chronic CVA   PT Frequency 1x / week   PT Duration 8 weeks   PT Treatment/Interventions ADLs/Self Care Home Management;Cryotherapy;Moist Heat;Neuromuscular re-education;Gait training;Functional mobility training;Stair training;Therapeutic activities;Therapeutic exercise;Balance training;Manual techniques;Aquatic Therapy;Canalith Repostioning;Electrical Stimulation;Traction;Ultrasound;DME Instruction;Patient/family education;Passive range of motion;Energy conservation;Vestibular   PT Next Visit Plan WALK WITH CANE   PT Home Exercise Plan As prescribed   Consulted and Agree with Plan of Care Patient      Patient will benefit from skilled therapeutic intervention in order to improve the following deficits and impairments:  Abnormal gait, Decreased activity tolerance, Decreased balance, Decreased strength, Difficulty walking, Obesity, Decreased coordination  Visit Diagnosis: Difficulty in walking, not elsewhere classified  Muscle weakness (generalized)     Problem List Patient Active Problem List   Diagnosis Date Noted  . Cough 03/20/2015  . Bronchitis, chronic obstructive w acute bronchitis ([place]) 03/20/2015  . Umbilical hernia without obstruction and without gangrene 01/12/2015  . Ventral hernia without obstruction or gangrene 01/02/2015   Janna Arch, PT, DPT   Janna Arch 08/30/2016, 12:06 PM  [place] MAIN [facility] SERVICES [address] [place], [place], [address] Phone:  [phone]   Fax:  [phone]  Name: Daniel Eaton. MRN: [id number] Date of Birth: 1940-06-02

## 2016-09-01 ENCOUNTER — Ambulatory Visit: Payer: PPO

## 2016-09-06 ENCOUNTER — Ambulatory Visit: Payer: PPO

## 2016-09-06 DIAGNOSIS — M6281 Muscle weakness (generalized): Secondary | ICD-10-CM

## 2016-09-06 DIAGNOSIS — R269 Unspecified abnormalities of gait and mobility: Secondary | ICD-10-CM

## 2016-09-06 DIAGNOSIS — R262 Difficulty in walking, not elsewhere classified: Secondary | ICD-10-CM | POA: Diagnosis not present

## 2016-09-06 NOTE — Therapy (Signed)
[place] MAIN [facility] SERVICES [address] [place], [place], [address] Phone: [phone]   Fax:  [phone]  Physical Therapy Treatment  Patient Details  Name: Daniel Eaton. MRN: [id number] Date of Birth: 10-04-1940 Referring Provider: Dr. Ouida Sills  Encounter Date: 09/06/2016      PT End of Session - 09/06/16 1254    Visit Number 33   Number of Visits 43   Authorization Type 3/10   PT Start Time 1115   PT Stop Time 1200   PT Time Calculation (min) 45 min   Equipment Utilized During Treatment Gait belt  min guard to S prn   Activity Tolerance Patient tolerated treatment well   Behavior During Therapy WFL for tasks assessed/performed      Past Medical History:  Diagnosis Date  . A-fib ([place])   . CHF (congestive heart failure) ([place])   . COPD (chronic obstructive pulmonary disease) ([place])   . Difficult intubation   . Hyperlipidemia   . Hypertension   . Hypothyroidism   . Kidney disease   . Sleep apnea   . Stroke ([place])   . Thyroid disease     Past Surgical History:  Procedure Laterality Date  . CARDIAC CATHETERIZATION    . CATARACT EXTRACTION W/ INTRAOCULAR LENS  IMPLANT, BILATERAL    . COLONOSCOPY  2008  . HERNIA REPAIR     bilateral inguinal hernia/ [place]  . HERNIA REPAIR  02/02/2015   18 x 28 cm ventral light mesh placed laparoscopically.  . TONSILLECTOMY    . UMBILICAL HERNIA REPAIR N/A 02/02/2015   Procedure: HERNIA REPAIR UMBILICAL ADULT;  Surgeon: Robert Bellow, MD;  Location: [facility] ORS;  Service: General;  Laterality: N/A;  . VENTRAL HERNIA REPAIR N/A 02/02/2015   Procedure: LAPAROSCOPIC VENTRAL HERNIA;  Surgeon: Robert Bellow, MD;  Location: [facility] ORS;  Service: General;  Laterality: N/A;  . vp shunt placement  1979    There were no vitals filed for this visit.     Subjective Assessment - 09/06/16 1119    Subjective Patient went to heart track yesterday and went of the T5 30 minutes with varying  resistance.    Pertinent History Pt reports a history of rapid onset balance difficulty since 2014. He reports that the initial episode occurred due to improper use of diuretic with hypokalemia. One year later he had another episode with a fall. He was sent to ENT and had a VNG study which was normal. ENT also ordered a head CT and MRI. MRI showed chronic R MCA infarct. Pt comes for [employer] 2-3x/wk. He has completed pulmonary rehab at [facility] twice. Pt denies dizziness or vertigo but reports difficulty with his balance    Patient Stated Goals Improve balance so that he only needs cane or no assistive device   Currently in Pain? Yes   Pain Score 2    Pain Location Knee   Pain Orientation Right;Left   Pain Descriptors / Indicators Sore   Pain Type Chronic pain   Pain Onset More than a month ago   Pain Frequency Intermittent     Gait 2x Ambulating with SPC around gym (~63 ft), no LOB, occasional stopping while ambulating to catch breath. Breathing difficult,  Ambulate 1x around gym with walker (~63 ft) no LOB no need for breathing rest breaks  TherEx Purse lipped breathing with scapular retraction Purse lipped breathing with shoulder circles  Scapular retractions 15x Seated rockerboard pf, df, combined x25 Ankle  pumps 20x Hip abduction standing 10x each side no UE support  Seated marches, 20x,  Seated hamstring stretch x 60 seconds each leg Seated adduction green ball x15 with 5 second squeezes Seated abduction with blue theraband and scapular retractions x15 Resisted knee flexion 15x with blue theraband  INV + DF squeeze of green ball x 15 seated      .          Pt. Required CGA and verbal cueing throughout session for proper body mechanics and safe mobility.    Pt. response to medical necessity: . Patient will continue to benefit from skilled physical therapy to improve safety during functional mobility            PT Education - 09/06/16 1254    Education provided  Yes   Education Details importance of ambulating frequently between sessions.    Person(s) Educated Patient   Methods Explanation;Demonstration   Comprehension Verbalized understanding             PT Long Term Goals - 08/16/16 2302      PT LONG TERM GOAL #1   Title Pt will be independent with HEP in order to improve strength and balance in order to decrease fall risk and improve function at home and work.   Time 8   Period Weeks   Status On-going     PT LONG TERM GOAL #2   Title Pt will improve BERG to 48 points in order to demonstrate clinically significant improvement in balance.    Baseline 04/19/16: 38/56; 05/24/16: 44/56 06/16/16: 43/56 08/16/16: 47/56   Time 8   Period Weeks   Status On-going     PT LONG TERM GOAL #3   Title Pt will decrease TUG to below 25 seconds in order to demonstrate decreased fall risk and improved LE strength      Baseline 04/19/16: 36.15 seconds; 05/24/16: 20sec; 06/16/16: 22.07 seconds   Time 8   Period Weeks   Status Achieved     PT LONG TERM GOAL #4   Title Pt will increase 10MWT by at least 0.13 m/s in order to demonstrate clinically significant improvement in community ambulation.    Baseline 04/19/16: 0.75 m/s; 05/24/2016 .833; 06/16/16: 11.4s = 0.88 m/s 7/3: 13 m =.28m/s   Time 8   Period Days   Status On-going     PT LONG TERM GOAL #5   Title Pt will decrease TUG to below 20 seconds in order to demonstrate decreased fall risk and improved LE strength      Baseline 04/19/16: 36.15 seconds; 05/24/16: 20sec; 06/16/16: 22.07 seconds   Status New     Additional Long Term Goals   Additional Long Term Goals Yes     PT LONG TERM GOAL #6   Title Patient will increase dynamic gait index score to >19/24 as to demonstrate reduced fall risk and improved dynamic gait balance for better safety with community/home ambulation.    Baseline 7/3: 14   Time 6   Period Weeks   Status New               Plan - 09/06/16 1255    Clinical Impression Statement  Patient progressing with functional ambulatory capacity with decreasing support on external devices. Ambulation with cane implemented for multiple trials with good control of posture and step length and no LOB. Patient will continue to benefit from skilled physical therapy to improve safety during functional mobility   Rehab Potential Good   Clinical  Impairments Affecting Rehab Potential Positive: motivation; Negative: chronic balance dysfunction, chronic CVA   PT Frequency 1x / week   PT Duration 8 weeks   PT Treatment/Interventions ADLs/Self Care Home Management;Cryotherapy;Moist Heat;Neuromuscular re-education;Gait training;Functional mobility training;Stair training;Therapeutic activities;Therapeutic exercise;Balance training;Manual techniques;Aquatic Therapy;Canalith Repostioning;Electrical Stimulation;Traction;Ultrasound;DME Instruction;Patient/family education;Passive range of motion;Energy conservation;Vestibular   PT Next Visit Plan d/c?   PT Home Exercise Plan As prescribed   Consulted and Agree with Plan of Care Patient      Patient will benefit from skilled therapeutic intervention in order to improve the following deficits and impairments:  Abnormal gait, Decreased activity tolerance, Decreased balance, Decreased strength, Difficulty walking, Obesity, Decreased coordination  Visit Diagnosis: Difficulty in walking, not elsewhere classified  Muscle weakness (generalized)  Gait difficulty     Problem List Patient Active Problem List   Diagnosis Date Noted  . Cough 03/20/2015  . Bronchitis, chronic obstructive w acute bronchitis ([place]) 03/20/2015  . Umbilical hernia without obstruction and without gangrene 01/12/2015  . Ventral hernia without obstruction or gangrene 01/02/2015   Janna Arch, PT, DPT   Janna Arch 09/06/2016, 12:56 PM  [place] MAIN [facility] SERVICES [address] [place], [place], [address] Phone: [phone]    Fax:  [phone]  Name: Daniel Eaton. MRN: [id number] Date of Birth: 17-Apr-1940

## 2016-09-08 ENCOUNTER — Ambulatory Visit: Payer: PPO

## 2016-09-12 DIAGNOSIS — M79675 Pain in left toe(s): Secondary | ICD-10-CM | POA: Diagnosis not present

## 2016-09-12 DIAGNOSIS — M9903 Segmental and somatic dysfunction of lumbar region: Secondary | ICD-10-CM | POA: Diagnosis not present

## 2016-09-12 DIAGNOSIS — M9904 Segmental and somatic dysfunction of sacral region: Secondary | ICD-10-CM | POA: Diagnosis not present

## 2016-09-12 DIAGNOSIS — M9902 Segmental and somatic dysfunction of thoracic region: Secondary | ICD-10-CM | POA: Diagnosis not present

## 2016-09-12 DIAGNOSIS — M9908 Segmental and somatic dysfunction of rib cage: Secondary | ICD-10-CM | POA: Diagnosis not present

## 2016-09-12 DIAGNOSIS — M79674 Pain in right toe(s): Secondary | ICD-10-CM | POA: Diagnosis not present

## 2016-09-12 DIAGNOSIS — B351 Tinea unguium: Secondary | ICD-10-CM | POA: Diagnosis not present

## 2016-09-13 ENCOUNTER — Ambulatory Visit: Payer: PPO

## 2016-09-13 DIAGNOSIS — R262 Difficulty in walking, not elsewhere classified: Secondary | ICD-10-CM | POA: Diagnosis not present

## 2016-09-13 DIAGNOSIS — M6281 Muscle weakness (generalized): Secondary | ICD-10-CM

## 2016-09-13 NOTE — Therapy (Signed)
[place] MAIN [facility] SERVICES [address] [place], [place], [address] Phone: [phone]   Fax:  [phone]  Physical Therapy Treatment  Patient Details  Name: Daniel Eaton. MRN: [id number] Date of Birth: 1940/08/01 Referring Provider: Dr. Ouida Sills  Encounter Date: 09/13/2016      PT End of Session - 09/13/16 1204    Visit Number 34   Number of Visits 43   Authorization Type 4/10   PT Start Time 1120   PT Stop Time 1201   PT Time Calculation (min) 41 min   Equipment Utilized During Treatment Gait belt  min guard to S prn   Activity Tolerance Patient tolerated treatment well   Behavior During Therapy WFL for tasks assessed/performed      Past Medical History:  Diagnosis Date  . A-fib ([place])   . CHF (congestive heart failure) ([place])   . COPD (chronic obstructive pulmonary disease) ([place])   . Difficult intubation   . Hyperlipidemia   . Hypertension   . Hypothyroidism   . Kidney disease   . Sleep apnea   . Stroke ([place])   . Thyroid disease     Past Surgical History:  Procedure Laterality Date  . CARDIAC CATHETERIZATION    . CATARACT EXTRACTION W/ INTRAOCULAR LENS  IMPLANT, BILATERAL    . COLONOSCOPY  2008  . HERNIA REPAIR     bilateral inguinal hernia/ [facility]  . HERNIA REPAIR  02/02/2015   18 x 28 cm ventral light mesh placed laparoscopically.  . TONSILLECTOMY    . UMBILICAL HERNIA REPAIR N/A 02/02/2015   Procedure: HERNIA REPAIR UMBILICAL ADULT;  Surgeon: Robert Bellow, MD;  Location: [facility] ORS;  Service: General;  Laterality: N/A;  . VENTRAL HERNIA REPAIR N/A 02/02/2015   Procedure: LAPAROSCOPIC VENTRAL HERNIA;  Surgeon: Robert Bellow, MD;  Location: [facility] ORS;  Service: General;  Laterality: N/A;  . vp shunt placement  1979    There were no vitals filed for this visit.      Subjective Assessment - 09/13/16 1127    Subjective Patient is having a bad day today. He overdid it yesterday and is having a hard  time breathing, increased bilateral knee pain, and fatigue.    Pertinent History Pt reports a history of rapid onset balance difficulty since 2014. He reports that the initial episode occurred due to improper use of diuretic with hypokalemia. One year later he had another episode with a fall. He was sent to ENT and had a VNG study which was normal. ENT also ordered a head CT and MRI. MRI showed chronic R MCA infarct. Pt comes for [employer] 2-3x/wk. He has completed pulmonary rehab at [facility] twice. Pt denies dizziness or vertigo but reports difficulty with his balance    Patient Stated Goals Improve balance so that he only needs cane or no assistive device   Currently in Pain? Yes   Pain Score 5    Pain Location Knee   Pain Orientation Right;Left   Pain Descriptors / Indicators Aching   Pain Type Chronic pain   Pain Onset More than a month ago   Pain Frequency Intermittent       10MWT: 14 seconds with walker=.714 BERG=49/56  Gait 2x Ambulating with RWaround gym (~63 ft), no LOB, occasional stopping while ambulating to catch breath. Breathing difficult,     TherEx Purse lipped breathing with scapular retraction Purse lipped breathing with shoulder circles  Scapular retractions 15x Seated hamstring stretch 2  x 60 seconds each leg Seated adduction green ball x15 with 5 second squeezes Seated abduction with blue theraband and scapular retractions x15  Patient required more frequent rest breaks due to fatigue and difficulty breathing           Pt. Required CGA and verbal cueing throughout session for proper body mechanics and safe mobility.    Pt. response to medical necessity: . Patient will continue to benefit from skilled physical therapy to improve safety during functional mobility       [facility] PT Assessment - 09/13/16 0001      Berg Balance Test   Sit to Stand Able to stand without using hands and stabilize independently   Standing Unsupported Able to stand safely 2  minutes   Sitting with Back Unsupported but Feet Supported on Floor or Stool Able to sit safely and securely 2 minutes   Stand to Sit Sits safely with minimal use of hands   Transfers Able to transfer safely, minor use of hands   Standing Unsupported with Eyes Closed Able to stand 10 seconds safely   Standing Ubsupported with Feet Together Able to place feet together independently and stand 1 minute safely   From Standing, Reach Forward with Outstretched Arm Can reach forward >12 cm safely (5")   From Standing Position, Pick up Object from Floor Able to pick up shoe safely and easily   From Standing Position, Turn to Look Behind Over each Shoulder Looks behind from both sides and weight shifts well   Turn 360 Degrees Able to turn 360 degrees safely but slowly   Standing Unsupported, Alternately Place Feet on Step/Stool Able to stand independently and complete 8 steps >20 seconds   Standing Unsupported, One Foot in Front Able to plae foot ahead of the other independently and hold 30 seconds   Standing on One Leg Able to lift leg independently and hold equal to or more than 3 seconds   Total Score 49                              PT Education - 09/13/16 1204    Education provided Yes   Education Details stretching to relieve soreness   Person(s) Educated Patient   Methods Explanation;Demonstration   Comprehension Verbalized understanding;Returned demonstration             PT Long Term Goals - 09/13/16 1201      PT LONG TERM GOAL #1   Title Pt will be independent with HEP in order to improve strength and balance in order to decrease fall risk and improve function at home and work.   Baseline continues to improve   Time 8   Period Weeks   Status On-going     PT LONG TERM GOAL #2   Title Pt will improve BERG to 48 points in order to demonstrate clinically significant improvement in balance.    Baseline 04/19/16: 38/56; 05/24/16: 44/56 06/16/16: 43/56 08/16/16: 47/56  7/31: 49/56   Time 8   Period Weeks   Status Achieved     PT LONG TERM GOAL #3   Title Pt will decrease TUG to below 25 seconds in order to demonstrate decreased fall risk and improved LE strength      Baseline 04/19/16: 36.15 seconds; 05/24/16: 20sec; 06/16/16: 22.07 seconds   Time 8   Period Weeks   Status Achieved     PT LONG TERM GOAL #4  Title Pt will increase 10MWT by at least 0.13 m/s in order to demonstrate clinically significant improvement in community ambulation.    Baseline 04/19/16: 0.75 m/s; 05/24/2016 .833; 06/16/16: 11.4s = 0.88 m/s 7/3: 13 m =.62m/s, 731: .714   Time 8   Period Days   Status On-going     PT LONG TERM GOAL #5   Title Pt will decrease TUG to below 20 seconds in order to demonstrate decreased fall risk and improved LE strength      Baseline 04/19/16: 36.15 seconds; 05/24/16: 20sec; 06/16/16: 22.07 seconds   Status New     PT LONG TERM GOAL #6   Title Patient will increase dynamic gait index score to >19/24 as to demonstrate reduced fall risk and improved dynamic gait balance for better safety with community/home ambulation.    Baseline 7/3: 14   Time 6   Period Weeks   Status New               Plan - 09/13/16 1205    Clinical Impression Statement Patient presented to [facility]rpay with increased fatigue, pain, and loss of breathe limiting duration of therapy between rest breaks. More frequent rest breaks were required. BERG improved to 49/56 however 10MWT was affected due to difficulty breathing and soreness of LE's.  Patient will continue to benefit from skilled physical therapy to improve safety during functional mobility   Rehab Potential Good   Clinical Impairments Affecting Rehab Potential Positive: motivation; Negative: chronic balance dysfunction, chronic CVA   PT Frequency 1x / week   PT Duration 8 weeks   PT Treatment/Interventions ADLs/Self Care Home Management;Cryotherapy;Moist Heat;Neuromuscular re-education;Gait training;Functional mobility  training;Stair training;Therapeutic activities;Therapeutic exercise;Balance training;Manual techniques;Aquatic Therapy;Canalith Repostioning;Electrical Stimulation;Traction;Ultrasound;DME Instruction;Patient/family education;Passive range of motion;Energy conservation;Vestibular   PT Next Visit Plan ambulation , faster, less support   PT Home Exercise Plan As prescribed   Consulted and Agree with Plan of Care Patient      Patient will benefit from skilled therapeutic intervention in order to improve the following deficits and impairments:  Abnormal gait, Decreased activity tolerance, Decreased balance, Decreased strength, Difficulty walking, Obesity, Decreased coordination  Visit Diagnosis: Difficulty in walking, not elsewhere classified  Muscle weakness (generalized)     Problem List Patient Active Problem List   Diagnosis Date Noted  . Cough 03/20/2015  . Bronchitis, chronic obstructive w acute bronchitis ([place]) 03/20/2015  . Umbilical hernia without obstruction and without gangrene 01/12/2015  . Ventral hernia without obstruction or gangrene 01/02/2015   Janna Arch, PT, DPT   Janna Arch 09/13/2016, 12:06 PM  [place] MAIN [facility] SERVICES [address] [place], [place], [address] Phone: [phone]   Fax:  [phone]  Name: Daniel Eaton. MRN: [id number] Date of Birth: October 02, 1940

## 2016-09-20 ENCOUNTER — Ambulatory Visit: Payer: PPO | Attending: Internal Medicine

## 2016-09-20 DIAGNOSIS — R262 Difficulty in walking, not elsewhere classified: Secondary | ICD-10-CM | POA: Diagnosis not present

## 2016-09-20 DIAGNOSIS — M6281 Muscle weakness (generalized): Secondary | ICD-10-CM | POA: Diagnosis not present

## 2016-09-20 NOTE — Therapy (Signed)
[place] MAIN [facility] SERVICES [address] [place], [place], [address] Phone: [phone]   Fax:  [phone]  Physical Therapy Treatment  Patient Details  Name: Daniel Eaton. MRN: [id number] Date of Birth: 09-20-1940 Referring Provider: Dr. Ouida Sills  Encounter Date: 09/20/2016      PT End of Session - 09/21/16 0729    Visit Number 35   Number of Visits 45   Date for PT Re-Evaluation 11/01/16   Authorization Type 5/10 (next will be 1/10)   PT Start Time 1115   PT Stop Time 1200   PT Time Calculation (min) 45 min   Equipment Utilized During Treatment Gait belt  min guard to S prn   Activity Tolerance Patient tolerated treatment well   Behavior During Therapy WFL for tasks assessed/performed      Past Medical History:  Diagnosis Date  . A-fib ([place])   . CHF (congestive heart failure) ([place])   . COPD (chronic obstructive pulmonary disease) ([place])   . Difficult intubation   . Hyperlipidemia   . Hypertension   . Hypothyroidism   . Kidney disease   . Sleep apnea   . Stroke ([place])   . Thyroid disease     Past Surgical History:  Procedure Laterality Date  . CARDIAC CATHETERIZATION    . CATARACT EXTRACTION W/ INTRAOCULAR LENS  IMPLANT, BILATERAL    . COLONOSCOPY  2008  . HERNIA REPAIR     bilateral inguinal hernia/ [facility]  . HERNIA REPAIR  02/02/2015   18 x 28 cm ventral light mesh placed laparoscopically.  . TONSILLECTOMY    . UMBILICAL HERNIA REPAIR N/A 02/02/2015   Procedure: HERNIA REPAIR UMBILICAL ADULT;  Surgeon: Robert Bellow, MD;  Location: [facility] ORS;  Service: General;  Laterality: N/A;  . VENTRAL HERNIA REPAIR N/A 02/02/2015   Procedure: LAPAROSCOPIC VENTRAL HERNIA;  Surgeon: Robert Bellow, MD;  Location: [facility] ORS;  Service: General;  Laterality: N/A;  . vp shunt placement  1979    There were no vitals filed for this visit.  RECERT     Subjective Assessment - 09/20/16 1120    Subjective Patient is  having difficulty breathing and is making an appointment with doctor for pulmonary function test and to assess potential inhaler. Pt. had to cancel trip plans to [place] to see graduation due to difficulty breathing, moving, traveling.    Pertinent History Pt reports a history of rapid onset balance difficulty since 2014. He reports that the initial episode occurred due to improper use of diuretic with hypokalemia. One year later he had another episode with a fall. He was sent to ENT and had a VNG study which was normal. ENT also ordered a head CT and MRI. MRI showed chronic R MCA infarct. Pt comes for [employer] 2-3x/wk. He has completed pulmonary rehab at [facility] twice. Pt denies dizziness or vertigo but reports difficulty with his balance    Patient Stated Goals Improve balance so that he only needs cane or no assistive device   Currently in Pain? No/denies     Gait DGI=17/24 Ambulating around gym with RW and CGA (94 ft) with cues for lifting feet, breathing in and out. (lap between seated exercises)   TherEx Purse lipped breathing with scapular retraction Purse lipped breathing with shoulder circles  Sit to stand x10 with cues for breathing sequencing. Tenderness to both knees (muscle soreness), breathe in stand up, breathe out sit down  Superset with laps:  Seated abduction  with BTB x 15 with cues for breathing and simultaneous shoulder retractions  Seated shoulder retractions with purse lipped breathing  Seated adduction with green ball x20 with cues for upright posture and 4-5 second squeezes  INV + DF squeeze of green ball x 15 seated   Seated abduction/IR with RTB around ankles x 10  Seated box step x 10 RTB x 5 GTB    Pt.'s ability to perform tasks was limited by breathing capacity.    .  Pt. Required CGA and verbal cueing throughout session for proper body mechanics and safe mobility.    Pt. response to medical necessity: . Patient will continue to benefit from skilled  physical therapy to improve safety during functional mobility         PT Education - 09/20/16 1122    Education provided Yes   Education Details breathing during functional movement.   Person(s) Educated Patient   Methods Explanation   Comprehension Verbalized understanding             PT Long Term Goals - 09/20/16 1153      PT LONG TERM GOAL #1   Title Pt will be independent with HEP in order to improve strength and balance in order to decrease fall risk and improve function at home and work.   Baseline continues to improve   Time 8   Period Weeks   Status On-going     PT LONG TERM GOAL #2   Title Pt will improve BERG to 48 points in order to demonstrate clinically significant improvement in balance.    Baseline 04/19/16: 38/56; 05/24/16: 44/56 06/16/16: 43/56 08/16/16: 47/56 7/31: 49/56   Time 8   Period Weeks   Status Achieved     PT LONG TERM GOAL #3   Title Pt will decrease TUG to below 25 seconds in order to demonstrate decreased fall risk and improved LE strength      Baseline 04/19/16: 36.15 seconds; 05/24/16: 20sec; 06/16/16: 22.07 seconds   Time 8   Period Weeks   Status Achieved     PT LONG TERM GOAL #4   Title Pt will increase 10MWT by at least 0.13 m/s in order to demonstrate clinically significant improvement in community ambulation.    Baseline 04/19/16: 0.75 m/s; 05/24/2016 .833; 06/16/16: 11.4s = 0.88 m/s 7/3: 13 m =.71ms, 731: .714   Time 8   Period Days   Status On-going     PT LONG TERM GOAL #5   Title Pt will decrease TUG to below 20 seconds in order to demonstrate decreased fall risk and improved LE strength      Baseline 04/19/16: 36.15 seconds; 05/24/16: 20sec; 06/16/16: 22.07 seconds   Status On-going     PT LONG TERM GOAL #6   Title Patient will increase dynamic gait index score to >19/24 as to demonstrate reduced fall risk and improved dynamic gait balance for better safety with community/home ambulation.    Baseline 7/3: 14 8/7: 17/24    Time 6    Period Weeks   Status Partially Met               Plan - 09/21/16 0737    Clinical Impression Statement Patient continues to progress with functional strengthening and ambulation however is limited by respiratory capacity at this time for capacity of functional activities. DGI=17/24. Patient is able to ambulate with improved mechanics and less assistance with UE's. Progression towards goals noted throughout this session and previous session. Patient will continue  to benefit from skilled physical therapy to improve safety during functional mobility   Rehab Potential Good   Clinical Impairments Affecting Rehab Potential Positive: motivation; Negative: chronic balance dysfunction, chronic CVA   PT Frequency 1x / week   PT Duration 8 weeks   PT Treatment/Interventions ADLs/Self Care Home Management;Cryotherapy;Moist Heat;Neuromuscular re-education;Gait training;Functional mobility training;Stair training;Therapeutic activities;Therapeutic exercise;Balance training;Manual techniques;Aquatic Therapy;Canalith Repostioning;Electrical Stimulation;Traction;Ultrasound;DME Instruction;Patient/family education;Passive range of motion;Energy conservation;Vestibular   PT Next Visit Plan ambulation , faster, less support   PT Home Exercise Plan As prescribed   Consulted and Agree with Plan of Care Patient      Patient will benefit from skilled therapeutic intervention in order to improve the following deficits and impairments:  Abnormal gait, Decreased activity tolerance, Decreased balance, Decreased strength, Difficulty walking, Obesity, Decreased coordination  Visit Diagnosis: Difficulty in walking, not elsewhere classified  Muscle weakness (generalized)       G-Codes - Oct 07, 2016 0739    Functional Assessment Tool Used (Outpatient Only) BERG, 10MWT, DGI, clinical judgement   Functional Limitation Mobility: Walking and moving around   Mobility: Walking and Moving Around Current Status [phone])  At least 20 percent but less than 40 percent impaired, limited or restricted   Mobility: Walking and Moving Around Goal Status [phone]) At least 1 percent but less than 20 percent impaired, limited or restricted      Problem List Patient Active Problem List   Diagnosis Date Noted  . Cough 03/20/2015  . Bronchitis, chronic obstructive w acute bronchitis ([place]) 03/20/2015  . Umbilical hernia without obstruction and without gangrene 01/12/2015  . Ventral hernia without obstruction or gangrene 01/02/2015   Janna Arch, PT, DPT   Janna Arch 2016/10/07, 7:41 AM  [place] MAIN [facility] SERVICES [address] [place], [place], [address] Phone: [phone]   Fax:  [phone]  Name: Ural Acree. MRN: [id number] Date of Birth: November 27, 1940

## 2016-09-21 NOTE — Addendum Note (Signed)
Addended by: Judene Companion on: 09/21/2016 07:43 AM   Modules accepted: Orders

## 2016-09-27 ENCOUNTER — Ambulatory Visit: Payer: PPO

## 2016-09-27 DIAGNOSIS — I700 Atherosclerosis of aorta: Secondary | ICD-10-CM | POA: Diagnosis not present

## 2016-09-27 DIAGNOSIS — N183 Chronic kidney disease, stage 3 (moderate): Secondary | ICD-10-CM | POA: Diagnosis not present

## 2016-09-27 DIAGNOSIS — I429 Cardiomyopathy, unspecified: Secondary | ICD-10-CM | POA: Diagnosis not present

## 2016-09-27 DIAGNOSIS — M6281 Muscle weakness (generalized): Secondary | ICD-10-CM

## 2016-09-27 DIAGNOSIS — R262 Difficulty in walking, not elsewhere classified: Secondary | ICD-10-CM

## 2016-09-27 DIAGNOSIS — I482 Chronic atrial fibrillation: Secondary | ICD-10-CM | POA: Diagnosis not present

## 2016-09-27 DIAGNOSIS — I129 Hypertensive chronic kidney disease with stage 1 through stage 4 chronic kidney disease, or unspecified chronic kidney disease: Secondary | ICD-10-CM | POA: Diagnosis not present

## 2016-09-27 DIAGNOSIS — E039 Hypothyroidism, unspecified: Secondary | ICD-10-CM | POA: Diagnosis not present

## 2016-09-27 NOTE — Therapy (Signed)
[place] MAIN [facility] SERVICES [address] [place], [place], [address] Phone: [phone]   Fax:  [phone]  Physical Therapy Treatment  Patient Details  Name: Daniel Eaton. MRN: [id number] Date of Birth: 1940-03-04 Referring Provider: Dr. Ouida Sills  Encounter Date: 09/27/2016      PT End of Session - 09/27/16 1812    Visit Number 36   Number of Visits 45   Date for PT Re-Evaluation 11/01/16   Authorization Type 1/10   PT Start Time 0952   PT Stop Time 1030   PT Time Calculation (min) 38 min   Equipment Utilized During Treatment Gait belt  min guard to S prn   Activity Tolerance Patient tolerated treatment well;Patient limited by fatigue   Behavior During Therapy WFL for tasks assessed/performed      Past Medical History:  Diagnosis Date  . A-fib ([place])   . CHF (congestive heart failure) ([place])   . COPD (chronic obstructive pulmonary disease) ([place])   . Difficult intubation   . Hyperlipidemia   . Hypertension   . Hypothyroidism   . Kidney disease   . Sleep apnea   . Stroke ([place])   . Thyroid disease     Past Surgical History:  Procedure Laterality Date  . CARDIAC CATHETERIZATION    . CATARACT EXTRACTION W/ INTRAOCULAR LENS  IMPLANT, BILATERAL    . COLONOSCOPY  2008  . HERNIA REPAIR     bilateral inguinal hernia/ [place]  . HERNIA REPAIR  02/02/2015   18 x 28 cm ventral light mesh placed laparoscopically.  . TONSILLECTOMY    . UMBILICAL HERNIA REPAIR N/A 02/02/2015   Procedure: HERNIA REPAIR UMBILICAL ADULT;  Surgeon: Robert Bellow, MD;  Location: [facility] ORS;  Service: General;  Laterality: N/A;  . VENTRAL HERNIA REPAIR N/A 02/02/2015   Procedure: LAPAROSCOPIC VENTRAL HERNIA;  Surgeon: Robert Bellow, MD;  Location: [facility] ORS;  Service: General;  Laterality: N/A;  . vp shunt placement  1979    There were no vitals filed for this visit.      Subjective Assessment - 09/27/16 0957    Subjective Patient went to  heart track yesterday and was on the nustep for 20 minutes. Today had a blood draw and had a lot of walking earlier priro to this session.    Pertinent History Pt reports a history of rapid onset balance difficulty since 2014. He reports that the initial episode occurred due to improper use of diuretic with hypokalemia. One year later he had another episode with a fall. He was sent to ENT and had a VNG study which was normal. ENT also ordered a head CT and MRI. MRI showed chronic R MCA infarct. Pt comes for [employer] 2-3x/wk. He has completed pulmonary rehab at [facility] twice. Pt denies dizziness or vertigo but reports difficulty with his balance    Patient Stated Goals Improve balance so that he only needs cane or no assistive device   Currently in Pain? No/denies      Gait 2x Ambulating with SPC around gym (~94 ft), no LOB, occasional stopping while ambulating to catch breath. Breathing difficult, cues for upright posture    TherEx Purse lipped breathing with scapular retraction Purse lipped breathing with shoulder circles  Scapular retractions 15x Ankle pumps 20x Hip abduction standing 10x each side no UE support  Standing marching at walker 20x, cues for upright posture and head up to promote increased breathing and proper body mechanics  Seated  marches with BTB, 20x,  Seated hamstring stretch with towelx 60 seconds each leg Seated adduction green ball x15 with 5 second squeezes Seated abduction with blue theraband and scapular retractions x30 Resisted knee flexion 15x with blue theraband  INV + DF squeeze of green ball x 15 seated         Pt. Required CGA and verbal cueing throughout session for proper body mechanics and safe mobility.    Pt. response to medical necessity: . Patient will continue to benefit from skilled physical therapy to improve safety during functional mobility               PT Long Term Goals - 09/20/16 1153      PT LONG TERM GOAL #1   Title Pt  will be independent with HEP in order to improve strength and balance in order to decrease fall risk and improve function at home and work.   Baseline continues to improve   Time 8   Period Weeks   Status On-going     PT LONG TERM GOAL #2   Title Pt will improve BERG to 48 points in order to demonstrate clinically significant improvement in balance.    Baseline 04/19/16: 38/56; 05/24/16: 44/56 06/16/16: 43/56 08/16/16: 47/56 7/31: 49/56   Time 8   Period Weeks   Status Achieved     PT LONG TERM GOAL #3   Title Pt will decrease TUG to below 25 seconds in order to demonstrate decreased fall risk and improved LE strength      Baseline 04/19/16: 36.15 seconds; 05/24/16: 20sec; 06/16/16: 22.07 seconds   Time 8   Period Weeks   Status Achieved     PT LONG TERM GOAL #4   Title Pt will increase 10MWT by at least 0.13 m/s in order to demonstrate clinically significant improvement in community ambulation.    Baseline 04/19/16: 0.75 m/s; 05/24/2016 .833; 06/16/16: 11.4s = 0.88 m/s 7/3: 13 m =.33ms, 731: .714   Time 8   Period Days   Status On-going     PT LONG TERM GOAL #5   Title Pt will decrease TUG to below 20 seconds in order to demonstrate decreased fall risk and improved LE strength      Baseline 04/19/16: 36.15 seconds; 05/24/16: 20sec; 06/16/16: 22.07 seconds   Status On-going     PT LONG TERM GOAL #6   Title Patient will increase dynamic gait index score to >19/24 as to demonstrate reduced fall risk and improved dynamic gait balance for better safety with community/home ambulation.    Baseline 7/3: 14 8/7: 17/24    Time 6   Period Weeks   Status Partially Met               Plan - 09/27/16 1814    Clinical Impression Statement Patient limited in participation due to exacerbation of breathing. Fatigue from activity prior to session limited patient's ambulatory capacity requiring him to have frequent standing rest breaks. Continued education on breathing while performing tasks provided with  pt. Demonstrating improved posture with decreased need for cueing.  Pt. response to medical necessity:   Rehab Potential Good   Clinical Impairments Affecting Rehab Potential Positive: motivation; Negative: chronic balance dysfunction, chronic CVA   PT Frequency 1x / week   PT Duration 8 weeks   PT Treatment/Interventions ADLs/Self Care Home Management;Cryotherapy;Moist Heat;Neuromuscular re-education;Gait training;Functional mobility training;Stair training;Therapeutic activities;Therapeutic exercise;Balance training;Manual techniques;Aquatic Therapy;Canalith Repostioning;Electrical Stimulation;Traction;Ultrasound;DME Instruction;Patient/family education;Passive range of motion;Energy conservation;Vestibular   PT Next Visit  Plan ambulation , faster, less support   PT Home Exercise Plan As prescribed   Consulted and Agree with Plan of Care Patient      Patient will benefit from skilled therapeutic intervention in order to improve the following deficits and impairments:  Abnormal gait, Decreased activity tolerance, Decreased balance, Decreased strength, Difficulty walking, Obesity, Decreased coordination  Visit Diagnosis: Difficulty in walking, not elsewhere classified  Muscle weakness (generalized)     Problem List Patient Active Problem List   Diagnosis Date Noted  . Cough 03/20/2015  . Bronchitis, chronic obstructive w acute bronchitis ([place]) 03/20/2015  . Umbilical hernia without obstruction and without gangrene 01/12/2015  . Ventral hernia without obstruction or gangrene 01/02/2015   Janna Arch, PT, DPT   Janna Arch 09/27/2016, 6:15 PM  [place] MAIN [facility] SERVICES [address] [place], [place], [address] Phone: [phone]   Fax:  [phone]  Name: Aldin Drees. MRN: [id number] Date of Birth: [birth year]

## 2016-09-28 ENCOUNTER — Ambulatory Visit: Payer: PPO

## 2016-10-03 DIAGNOSIS — M9903 Segmental and somatic dysfunction of lumbar region: Secondary | ICD-10-CM | POA: Diagnosis not present

## 2016-10-03 DIAGNOSIS — M9904 Segmental and somatic dysfunction of sacral region: Secondary | ICD-10-CM | POA: Diagnosis not present

## 2016-10-03 DIAGNOSIS — M9902 Segmental and somatic dysfunction of thoracic region: Secondary | ICD-10-CM | POA: Diagnosis not present

## 2016-10-04 ENCOUNTER — Ambulatory Visit: Payer: PPO

## 2016-10-04 DIAGNOSIS — G4733 Obstructive sleep apnea (adult) (pediatric): Secondary | ICD-10-CM | POA: Diagnosis not present

## 2016-10-04 DIAGNOSIS — J42 Unspecified chronic bronchitis: Secondary | ICD-10-CM | POA: Diagnosis not present

## 2016-10-04 DIAGNOSIS — I429 Cardiomyopathy, unspecified: Secondary | ICD-10-CM | POA: Diagnosis not present

## 2016-10-04 DIAGNOSIS — R262 Difficulty in walking, not elsewhere classified: Secondary | ICD-10-CM

## 2016-10-04 DIAGNOSIS — I129 Hypertensive chronic kidney disease with stage 1 through stage 4 chronic kidney disease, or unspecified chronic kidney disease: Secondary | ICD-10-CM | POA: Diagnosis not present

## 2016-10-04 DIAGNOSIS — I700 Atherosclerosis of aorta: Secondary | ICD-10-CM | POA: Diagnosis not present

## 2016-10-04 DIAGNOSIS — I482 Chronic atrial fibrillation: Secondary | ICD-10-CM | POA: Diagnosis not present

## 2016-10-04 DIAGNOSIS — M6281 Muscle weakness (generalized): Secondary | ICD-10-CM

## 2016-10-04 DIAGNOSIS — E039 Hypothyroidism, unspecified: Secondary | ICD-10-CM | POA: Diagnosis not present

## 2016-10-04 DIAGNOSIS — N183 Chronic kidney disease, stage 3 (moderate): Secondary | ICD-10-CM | POA: Diagnosis not present

## 2016-10-04 DIAGNOSIS — Z6841 Body Mass Index (BMI) 40.0 and over, adult: Secondary | ICD-10-CM | POA: Diagnosis not present

## 2016-10-04 NOTE — Therapy (Signed)
[place] MAIN [facility] SERVICES [address] [place], [place], [address] Phone: [phone]   Fax:  [phone]  Physical Therapy Treatment  Patient Details  Name: Daniel Eaton. MRN: [id number] Date of Birth: 06-02-1940 Referring Provider: Dr. Ouida Sills  Encounter Date: 10/04/2016      PT End of Session - 10/04/16 1107    Visit Number 37   Number of Visits 45   Date for PT Re-Evaluation 11/01/16   Authorization Type 2/10   PT Start Time 0938   PT Stop Time 1016   PT Time Calculation (min) 38 min   Equipment Utilized During Treatment Gait belt  min guard to S prn   Activity Tolerance Patient tolerated treatment well;Patient limited by fatigue   Behavior During Therapy WFL for tasks assessed/performed      Past Medical History:  Diagnosis Date  . A-fib ([place])   . CHF (congestive heart failure) ([place])   . COPD (chronic obstructive pulmonary disease) ([place])   . Difficult intubation   . Hyperlipidemia   . Hypertension   . Hypothyroidism   . Kidney disease   . Sleep apnea   . Stroke ([place])   . Thyroid disease     Past Surgical History:  Procedure Laterality Date  . CARDIAC CATHETERIZATION    . CATARACT EXTRACTION W/ INTRAOCULAR LENS  IMPLANT, BILATERAL    . COLONOSCOPY  2008  . HERNIA REPAIR     bilateral inguinal hernia/ [place]  . HERNIA REPAIR  02/02/2015   18 x 28 cm ventral light mesh placed laparoscopically.  . TONSILLECTOMY    . UMBILICAL HERNIA REPAIR N/A 02/02/2015   Procedure: HERNIA REPAIR UMBILICAL ADULT;  Surgeon: Robert Bellow, MD;  Location: [facility] ORS;  Service: General;  Laterality: N/A;  . VENTRAL HERNIA REPAIR N/A 02/02/2015   Procedure: LAPAROSCOPIC VENTRAL HERNIA;  Surgeon: Robert Bellow, MD;  Location: [facility] ORS;  Service: General;  Laterality: N/A;  . vp shunt placement  1979    There were no vitals filed for this visit.      Subjective Assessment - 10/04/16 0944    Subjective Patient's  breathing is getting really difficult causing him to tire frequently.    Pertinent History Pt reports a history of rapid onset balance difficulty since 2014. He reports that the initial episode occurred due to improper use of diuretic with hypokalemia. One year later he had another episode with a fall. He was sent to ENT and had a VNG study which was normal. ENT also ordered a head CT and MRI. MRI showed chronic R MCA infarct. Pt comes for [employer] 2-3x/wk. He has completed pulmonary rehab at [facility] twice. Pt denies dizziness or vertigo but reports difficulty with his balance    Patient Stated Goals Improve balance so that he only needs cane or no assistive device   Currently in Pain? No/denies    TherEx Purse lipped breathing seated with scapular retractions between each lap around the cone  Stand up walk around cone turn back and sit down,  (~25 ft) RW x 1, SPC x2. Breathing difficulty increased with progression. Sp02 monitored  Sit to stand from plinth with no UE support  Standing marches x 15 Seated abduction blue TB x 15 with scapular retractions  Seated step out with BTB around ankles 20x each foot  Resisted knee flexion with Blue TB x15 each leg Seated marches x 15   Neuro Re-ed: Standing with horizontal head turns x 90  seconds, vertical head turns x 90 seconds, initially with sway for vertical that decreased with repetition  Standing with no UE support, reaching and returning to upright position x 20   Patient's breathing distress limited therapy interventions today.   Pt. response to medical necessity:  Patient will continue to benefit from skilled physical therapy to improve safety during functional mobility.                             PT Education - 10/04/16 1009    Education provided Yes   Education Details breathing while ambulating, body mechanics   Person(s) Educated Patient   Methods Explanation;Demonstration;Verbal cues   Comprehension  Verbalized understanding;Returned demonstration;Verbal cues required             PT Long Term Goals - 09/20/16 1153      PT LONG TERM GOAL #1   Title Pt will be independent with HEP in order to improve strength and balance in order to decrease fall risk and improve function at home and work.   Baseline continues to improve   Time 8   Period Weeks   Status On-going     PT LONG TERM GOAL #2   Title Pt will improve BERG to 48 points in order to demonstrate clinically significant improvement in balance.    Baseline 04/19/16: 38/56; 05/24/16: 44/56 06/16/16: 43/56 08/16/16: 47/56 7/31: 49/56   Time 8   Period Weeks   Status Achieved     PT LONG TERM GOAL #3   Title Pt will decrease TUG to below 25 seconds in order to demonstrate decreased fall risk and improved LE strength      Baseline 04/19/16: 36.15 seconds; 05/24/16: 20sec; 06/16/16: 22.07 seconds   Time 8   Period Weeks   Status Achieved     PT LONG TERM GOAL #4   Title Pt will increase 10MWT by at least 0.13 m/s in order to demonstrate clinically significant improvement in community ambulation.    Baseline 04/19/16: 0.75 m/s; 05/24/2016 .833; 06/16/16: 11.4s = 0.88 m/s 7/3: 13 m =.5ms, 731: .714   Time 8   Period Days   Status On-going     PT LONG TERM GOAL #5   Title Pt will decrease TUG to below 20 seconds in order to demonstrate decreased fall risk and improved LE strength      Baseline 04/19/16: 36.15 seconds; 05/24/16: 20sec; 06/16/16: 22.07 seconds   Status On-going     PT LONG TERM GOAL #6   Title Patient will increase dynamic gait index score to >19/24 as to demonstrate reduced fall risk and improved dynamic gait balance for better safety with community/home ambulation.    Baseline 7/3: 14 8/7: 17/24    Time 6   Period Weeks   Status Partially Met               Plan - 10/04/16 1110    Clinical Impression Statement Patient's breathing distress limited therapy interventions today. Participation in standing tasks was  limited due to exacerbation of this breathing distress. Sp02 monitored throughout session. Standing balance improving with ability to reach forward and return to upright position. Patient will continue to benefit from skilled physical therapy to improve safety during functional mobility.   Rehab Potential Good   Clinical Impairments Affecting Rehab Potential Positive: motivation; Negative: chronic balance dysfunction, chronic CVA   PT Frequency 1x / week   PT Duration 8 weeks  PT Treatment/Interventions ADLs/Self Care Home Management;Cryotherapy;Moist Heat;Neuromuscular re-education;Gait training;Functional mobility training;Stair training;Therapeutic activities;Therapeutic exercise;Balance training;Manual techniques;Aquatic Therapy;Canalith Repostioning;Electrical Stimulation;Traction;Ultrasound;DME Instruction;Patient/family education;Passive range of motion;Energy conservation;Vestibular   PT Next Visit Plan ambulation , faster, less support   PT Home Exercise Plan As prescribed   Consulted and Agree with Plan of Care Patient      Patient will benefit from skilled therapeutic intervention in order to improve the following deficits and impairments:  Abnormal gait, Decreased activity tolerance, Decreased balance, Decreased strength, Difficulty walking, Obesity, Decreased coordination  Visit Diagnosis: Difficulty in walking, not elsewhere classified  Muscle weakness (generalized)     Problem List Patient Active Problem List   Diagnosis Date Noted  . Cough 03/20/2015  . Bronchitis, chronic obstructive w acute bronchitis ([place]) 03/20/2015  . Umbilical hernia without obstruction and without gangrene 01/12/2015  . Ventral hernia without obstruction or gangrene 01/02/2015   Janna Arch, PT, DPT  Janna Arch 10/04/2016, 11:12 AM  [place] MAIN [facility] SERVICES [address] [place], [place], [address] Phone: [phone]   Fax:   [phone]  Name: Daniel Eaton. MRN: [id number] Date of Birth: Jun 12, 1940

## 2016-10-11 ENCOUNTER — Ambulatory Visit: Payer: PPO

## 2016-10-18 ENCOUNTER — Ambulatory Visit: Payer: PPO

## 2016-10-18 ENCOUNTER — Ambulatory Visit: Payer: PPO | Attending: Internal Medicine

## 2016-10-18 DIAGNOSIS — R262 Difficulty in walking, not elsewhere classified: Secondary | ICD-10-CM

## 2016-10-18 DIAGNOSIS — M6281 Muscle weakness (generalized): Secondary | ICD-10-CM | POA: Diagnosis not present

## 2016-10-18 DIAGNOSIS — R269 Unspecified abnormalities of gait and mobility: Secondary | ICD-10-CM

## 2016-10-18 NOTE — Therapy (Signed)
[place] MAIN [facility] SERVICES [address] [place], [place], [address] Phone: [phone]   Fax:  [phone]  Physical Therapy Treatment  Patient Details  Name: Daniel Eaton. MRN: [id number] Date of Birth: June 02, 1940 Referring Provider: Dr. Ouida Sills  Encounter Date: 10/18/2016      PT End of Session - 10/18/16 1050    Visit Number 38   Number of Visits 45   Date for PT Re-Evaluation 11/01/16   Authorization Type 3/10   PT Start Time 1035   PT Stop Time 1115   PT Time Calculation (min) 40 min   Equipment Utilized During Treatment Gait belt  min guard to S prn   Activity Tolerance Patient tolerated treatment well;Patient limited by fatigue;Treatment limited secondary to medical complications (Comment)   Behavior During Therapy [facility] for tasks assessed/performed;Anxious      Past Medical History:  Diagnosis Date  . A-fib ([place])   . CHF (congestive heart failure) ([place])   . COPD (chronic obstructive pulmonary disease) ([place])   . Difficult intubation   . Hyperlipidemia   . Hypertension   . Hypothyroidism   . Kidney disease   . Sleep apnea   . Stroke ([place])   . Thyroid disease     Past Surgical History:  Procedure Laterality Date  . CARDIAC CATHETERIZATION    . CATARACT EXTRACTION W/ INTRAOCULAR LENS  IMPLANT, BILATERAL    . COLONOSCOPY  2008  . HERNIA REPAIR     bilateral inguinal hernia/ [place]  . HERNIA REPAIR  02/02/2015   18 x 28 cm ventral light mesh placed laparoscopically.  . TONSILLECTOMY    . UMBILICAL HERNIA REPAIR N/A 02/02/2015   Procedure: HERNIA REPAIR UMBILICAL ADULT;  Surgeon: Robert Bellow, MD;  Location: [facility] ORS;  Service: General;  Laterality: N/A;  . VENTRAL HERNIA REPAIR N/A 02/02/2015   Procedure: LAPAROSCOPIC VENTRAL HERNIA;  Surgeon: Robert Bellow, MD;  Location: [facility] ORS;  Service: General;  Laterality: N/A;  . vp shunt placement  1979    There were no vitals filed for this visit.       Subjective Assessment - 10/18/16 1040    Subjective Patient started new diuretics two weeks ago and lost about 20 lb since then. Has not done any exercise in two weeks, feeling very weak and tired   Pertinent History Pt reports a history of rapid onset balance difficulty since 2014. He reports that the initial episode occurred due to improper use of diuretic with hypokalemia. One year later he had another episode with a fall. He was sent to ENT and had a VNG study which was normal. ENT also ordered a head CT and MRI. MRI showed chronic R MCA infarct. Pt comes for [employer] 2-3x/wk. He has completed pulmonary rehab at [facility] twice. Pt denies dizziness or vertigo but reports difficulty with his balance    Patient Stated Goals Improve balance so that he only needs cane or no assistive device   Currently in Pain? No/denies     Increased discoloration of bilateral ankles , will discuss when see doctor wednesday    10MWT=23 seconds-.43 m/s Attempted TUG=unable to perform  TherEx Sit to stand from plinth table of different heights 5x, unable to stand without assistance from normal chair height-require mod A Seated knee extensions 2lb ankle weight cues for height of leg, 2x, 15x each leg Ambulating 30 ft with RW and CGA Side stepping in // bars 4x length of bars Step forward  and back in // bars 10x each leg, rest between legs for breathing   Neuro re-ed  airex pad balance balance Airex pad horizontal head turn airex pad scapular retractions     Pt. response to medical necessity:  Patient will continue to benefit from skilled physical therapy to improve safety during functional mobility.                           PT Education - 10/18/16 1050    Education provided Yes   Education Details safe transfers on diuretics   Person(s) Educated Patient   Methods Explanation;Demonstration;Verbal cues   Comprehension Verbalized understanding;Returned demonstration              PT Long Term Goals - 10/18/16 1220      PT LONG TERM GOAL #1   Title Pt will be independent with HEP in order to improve strength and balance in order to decrease fall risk and improve function at home and work.   Baseline continues to improve   Time 8   Period Weeks   Status On-going     PT LONG TERM GOAL #2   Title Pt will improve BERG to 48 points in order to demonstrate clinically significant improvement in balance.    Baseline 04/19/16: 38/56; 05/24/16: 44/56 06/16/16: 43/56 08/16/16: 47/56 7/31: 49/56   Time 8   Period Weeks   Status Achieved     PT LONG TERM GOAL #3   Title Pt will decrease TUG to below 25 seconds in order to demonstrate decreased fall risk and improved LE strength      Baseline 04/19/16: 36.15 seconds; 05/24/16: 20sec; 06/16/16: 22.07 seconds   Time 8   Period Weeks   Status Achieved     PT LONG TERM GOAL #4   Title Pt will increase 10MWT by at least 0.13 m/s in order to demonstrate clinically significant improvement in community ambulation.    Baseline 04/19/16: 0.75 m/s; 05/24/2016 .833; 06/16/16: 11.4s = 0.88 m/s 7/3: 13 m =.23ms, 731: .714 9/4: .43   Time 8   Period Days   Status On-going     PT LONG TERM GOAL #5   Title Pt will decrease TUG to below 20 seconds in order to demonstrate decreased fall risk and improved LE strength      Baseline 04/19/16: 36.15 seconds; 05/24/16: 20sec; 06/16/16: 22.07 seconds   Status On-going     PT LONG TERM GOAL #6   Title Patient will increase dynamic gait index score to >19/24 as to demonstrate reduced fall risk and improved dynamic gait balance for better safety with community/home ambulation.    Baseline 7/3: 14 8/7: 17/24    Time 6   Period Weeks   Status Partially Met               Plan - 10/18/16 1220    Clinical Impression Statement Patient limited in all standing and ambulatory tasks by severe weakness. Required multiple trials to get out of chair. Excessive weakness of LE's noted thoughout  session with RLE weaker than LLE. Unable to perform TUG.   Patient will continue to benefit from skilled physical therapy to improve safety during functional mobility.   Rehab Potential Good   Clinical Impairments Affecting Rehab Potential Positive: motivation; Negative: chronic balance dysfunction, chronic CVA   PT Frequency 1x / week   PT Duration 8 weeks   PT Treatment/Interventions ADLs/Self Care Home Management;Cryotherapy;Moist Heat;Neuromuscular re-education;Gait training;Functional  mobility training;Stair training;Therapeutic activities;Therapeutic exercise;Balance training;Manual techniques;Aquatic Therapy;Canalith Repostioning;Electrical Stimulation;Traction;Ultrasound;DME Instruction;Patient/family education;Passive range of motion;Energy conservation;Vestibular   PT Next Visit Plan goals   PT Home Exercise Plan As prescribed   Consulted and Agree with Plan of Care Patient      Patient will benefit from skilled therapeutic intervention in order to improve the following deficits and impairments:  Abnormal gait, Decreased activity tolerance, Decreased balance, Decreased strength, Difficulty walking, Obesity, Decreased coordination  Visit Diagnosis: Difficulty in walking, not elsewhere classified  Muscle weakness (generalized)  Gait difficulty     Problem List Patient Active Problem List   Diagnosis Date Noted  . Cough 03/20/2015  . Bronchitis, chronic obstructive w acute bronchitis ([place]) 03/20/2015  . Umbilical hernia without obstruction and without gangrene 01/12/2015  . Ventral hernia without obstruction or gangrene 01/02/2015   Janna Arch, PT, DPT   Janna Arch 10/18/2016, 12:22 PM  [place] MAIN [facility] SERVICES [address] [place], [place], [address] Phone: [phone]   Fax:  [phone]  Name: Daniel Eaton. MRN: [id number] Date of Birth: 12-Dec-1940

## 2016-10-19 DIAGNOSIS — Z6841 Body Mass Index (BMI) 40.0 and over, adult: Secondary | ICD-10-CM | POA: Diagnosis not present

## 2016-10-19 DIAGNOSIS — N183 Chronic kidney disease, stage 3 (moderate): Secondary | ICD-10-CM | POA: Diagnosis not present

## 2016-10-19 DIAGNOSIS — I700 Atherosclerosis of aorta: Secondary | ICD-10-CM | POA: Diagnosis not present

## 2016-10-19 DIAGNOSIS — I129 Hypertensive chronic kidney disease with stage 1 through stage 4 chronic kidney disease, or unspecified chronic kidney disease: Secondary | ICD-10-CM | POA: Diagnosis not present

## 2016-10-19 DIAGNOSIS — E039 Hypothyroidism, unspecified: Secondary | ICD-10-CM | POA: Diagnosis not present

## 2016-10-19 DIAGNOSIS — G4733 Obstructive sleep apnea (adult) (pediatric): Secondary | ICD-10-CM | POA: Diagnosis not present

## 2016-10-19 DIAGNOSIS — J42 Unspecified chronic bronchitis: Secondary | ICD-10-CM | POA: Diagnosis not present

## 2016-10-19 DIAGNOSIS — I429 Cardiomyopathy, unspecified: Secondary | ICD-10-CM | POA: Diagnosis not present

## 2016-10-19 DIAGNOSIS — I482 Chronic atrial fibrillation: Secondary | ICD-10-CM | POA: Diagnosis not present

## 2016-10-24 ENCOUNTER — Ambulatory Visit: Payer: PPO

## 2016-10-25 ENCOUNTER — Ambulatory Visit: Payer: PPO

## 2016-10-31 ENCOUNTER — Ambulatory Visit: Payer: PPO

## 2016-11-01 ENCOUNTER — Ambulatory Visit: Payer: PPO

## 2016-11-07 ENCOUNTER — Ambulatory Visit: Payer: PPO

## 2016-11-07 VITALS — BP 145/79 | HR 74

## 2016-11-07 DIAGNOSIS — R262 Difficulty in walking, not elsewhere classified: Secondary | ICD-10-CM

## 2016-11-07 DIAGNOSIS — M6281 Muscle weakness (generalized): Secondary | ICD-10-CM

## 2016-11-07 NOTE — Therapy (Signed)
[facility] [address] [place], [place], [address] Phone: [phone]   Fax:  [phone]  Physical Therapy Treatment/Progress Note  Patient Details  Name: Daniel Eaton. MRN: [id number] Date of Birth: 08-Oct-1940 Referring Provider: Dr. Ouida Sills  Encounter Date: 11/07/2016      PT End of Session - 11/07/16 1029    Visit Number 39   Number of Visits 53   Date for PT Re-Evaluation 01/02/17   Authorization Type 1/10   PT Start Time 1030   PT Stop Time 1115   PT Time Calculation (min) 45 min   Equipment Utilized During Treatment Gait belt  min guard to S prn   Activity Tolerance Patient tolerated treatment well   Behavior During Therapy [facility] for tasks assessed/performed;Anxious      Past Medical History:  Diagnosis Date  . A-fib ([place])   . CHF (congestive heart failure) ([place])   . COPD (chronic obstructive pulmonary disease) ([place])   . Difficult intubation   . Hyperlipidemia   . Hypertension   . Hypothyroidism   . Kidney disease   . Sleep apnea   . Stroke ([place])   . Thyroid disease     Past Surgical History:  Procedure Laterality Date  . CARDIAC CATHETERIZATION    . CATARACT EXTRACTION W/ INTRAOCULAR LENS  IMPLANT, BILATERAL    . COLONOSCOPY  2008  . HERNIA REPAIR     bilateral inguinal hernia/ [facility]  . HERNIA REPAIR  02/02/2015   18 x 28 cm ventral light mesh placed laparoscopically.  . TONSILLECTOMY    . UMBILICAL HERNIA REPAIR N/A 02/02/2015   Procedure: HERNIA REPAIR UMBILICAL ADULT;  Surgeon: Robert Bellow, MD;  Location: [facility] ORS;  Service: General;  Laterality: N/A;  . VENTRAL HERNIA REPAIR N/A 02/02/2015   Procedure: LAPAROSCOPIC VENTRAL HERNIA;  Surgeon: Robert Bellow, MD;  Location: [facility] ORS;  Service: General;  Laterality: N/A;  . vp shunt placement  1979    Vitals:   11/07/16 1034  BP: (!) 145/79  Pulse: 74  SpO2: 98%        Subjective Assessment - 11/07/16 1029    Subjective Pt reports that he is doing well today. He has not been able to come for the last couple visits due to problems with his new diuretic and the recent bad weather. He states that he is now on a maintenance inhaler which has made a big difference in his breathing. He also reports that a new CPAP machine is helping.    Pertinent History Pt reports a history of rapid onset balance difficulty since 2014. He reports that the initial episode occurred due to improper use of diuretic with hypokalemia. One year later he had another episode with a fall. He was sent to ENT and had a VNG study which was normal. ENT also ordered a head CT and MRI. MRI showed chronic R MCA infarct. Pt comes for [employer] 2-3x/wk. He has completed pulmonary rehab at [facility] twice. Pt denies dizziness or vertigo but reports difficulty with his balance    Patient Stated Goals Improve balance so that he only needs cane or no assistive device   Currently in Pain? No/denies         TREATMENT       [facility] PT Assessment - 11/07/16 1058      Standardized Balance Assessment   Standardized Balance Assessment 10 meter walk test;Berg Balance Test;Dynamic Gait Index   10 Meter Walk  13.5s = 0.74 m/s     Berg Balance Test   Sit to Stand Able to stand using hands after several tries   Standing Unsupported Able to stand safely 2 minutes   Sitting with Back Unsupported but Feet Supported on Floor or Stool Able to sit safely and securely 2 minutes   Stand to Sit Sits safely with minimal use of hands   Transfers Able to transfer with verbal cueing and /or supervision   Standing Unsupported with Eyes Closed Able to stand 10 seconds safely   Standing Ubsupported with Feet Together Needs help to attain position but able to stand for 30 seconds with feet together   From Standing, Reach Forward with Outstretched Arm Can reach forward >12 cm safely (5")   From Standing Position, Pick up Object from Floor Able to pick up shoe safely and  easily   From Standing Position, Turn to Look Behind Over each Shoulder Looks behind from both sides and weight shifts well   Turn 360 Degrees Able to turn 360 degrees safely but slowly   Standing Unsupported, Alternately Place Feet on Step/Stool Able to stand independently and safely and complete 8 steps in 20 seconds   Standing Unsupported, One Foot in Front Able to plae foot ahead of the other independently and hold 30 seconds   Standing on One Leg Tries to lift leg/unable to hold 3 seconds but remains standing independently   Total Score 42     Dynamic Gait Index   Level Surface Mild Impairment   Change in Gait Speed Mild Impairment   Gait with Horizontal Head Turns Mild Impairment   Gait with Vertical Head Turns Mild Impairment   Gait and Pivot Turn Mild Impairment   Step Over Obstacle Moderate Impairment   Step Around Obstacles Mild Impairment   Steps Moderate Impairment   Total Score 14          Ther-ex Repeated outcome measures:  10MWT=13.5s = 0.74 m/s Attempted TUG=unable to perform without min/modA+1 assist to stand from chair even with UE support on walker; Performed BERG and DGI; Updated goals and discussed plan of care with patient; Seated marches 2lb ankle weight 2 x 15 bilateral; Seated knee extensions 2lb ankle weight 2 x 15 bilateral;  Sit to stand from plinth table of 2 different heights x 10 each, unable to stand without assistance from normal chair height-require min/mod A+1   Pt. response to medical necessity:  Patient will continue to benefit from skilled physical therapy to improve safety during functional mobility.                          PT Education - 11/07/16 1029    Education provided Yes   Education Details outcome measures, goals, plan of care, exercise technique   Person(s) Educated Patient   Methods Explanation;Demonstration   Comprehension Verbalized understanding;Verbal cues required             PT Long Term  Goals - 11/07/16 1034      PT LONG TERM GOAL #1   Title Pt will be independent with HEP in order to improve strength and balance in order to decrease fall risk and improve function at home and work.   Baseline continues to improve   Time 8   Period Weeks   Status On-going     PT LONG TERM GOAL #2   Title Pt will improve BERG to 48 points in order to demonstrate clinically  significant improvement in balance.    Baseline 04/19/16: 38/56; 05/24/16: 95/09 06/16/16: 43/56 08/16/16: 47/56 7/31: 49/56; Nov 10, 2016: 42/56   Time 8   Period Weeks   Status On-going     PT LONG TERM GOAL #3   Title Pt will decrease TUG to below 25 seconds in order to demonstrate decreased fall risk and improved LE strength      Baseline 04/19/16: 36.15 seconds; 05/24/16: 20sec; 06/16/16: 22.07 seconds;    Time 8   Period Weeks   Status Achieved     PT LONG TERM GOAL #4   Title Pt will increase 10MWT by at least 0.13 m/s in order to demonstrate clinically significant improvement in community ambulation.    Baseline 04/19/16: 0.75 m/s; 05/24/2016 .833; 06/16/16: 11.4s = 0.88 m/s 7/3: 13 m =.23m/s, 731: .714 9/4: .43; November 10, 2016: 0.74 m/s   Time 8   Period Days   Status On-going     PT LONG TERM GOAL #5   Title Pt will decrease TUG to below 20 seconds in order to demonstrate decreased fall risk and improved LE strength      Baseline 04/19/16: 36.15 seconds; 05/24/16: 20sec; 06/16/16: 22.07 seconds; Nov 10, 2016: unable to perform today due to weakness with transfers   Status On-going     PT LONG TERM GOAL #6   Title Patient will increase dynamic gait index score to >19/24 as to demonstrate reduced fall risk and improved dynamic gait balance for better safety with community/home ambulation.    Baseline 7/3: 14 8/7: 17/24; 2016/11/10: 14/24   Time 6   Period Weeks   Status On-going               Plan - 10-Nov-2016 1030    Clinical Impression Statement Pt has not been able to come for his last couple appointments due to medication  problems as well as recent bad weather. His BERG and DGI have both declined from the last time they were assessed. His 10MWT is back to where it started at his initial evaluation as he had a brief period of improvement followed by a decline. He still struggles with transfers and is unable to perform a sit to stand from a regular height chair today without multiple trials, UE assist, and minimal assistance from therapist. He would continue to benefit from skilled PT services to address deficits in weakness and balance in order to prevent functional decline, decrease fall risk, and improve his independence with self-care tasks and functional mobility   Clinical Presentation Unstable   Clinical Decision Making Moderate   Rehab Potential Good   Clinical Impairments Affecting Rehab Potential Positive: motivation; Negative: chronic balance dysfunction, chronic CVA   PT Frequency 1x / week   PT Duration 8 weeks   PT Treatment/Interventions ADLs/Self Care Home Management;Cryotherapy;Moist Heat;Neuromuscular re-education;Gait training;Functional mobility training;Stair training;Therapeutic activities;Therapeutic exercise;Balance training;Manual techniques;Aquatic Therapy;Canalith Repostioning;Electrical Stimulation;Traction;Ultrasound;DME Instruction;Patient/family education;Passive range of motion;Energy conservation;Vestibular   PT Next Visit Plan goals   PT Home Exercise Plan As prescribed   Consulted and Agree with Plan of Care Patient      Patient will benefit from skilled therapeutic intervention in order to improve the following deficits and impairments:  Abnormal gait, Decreased activity tolerance, Decreased balance, Decreased strength, Difficulty walking, Obesity, Decreased coordination  Visit Diagnosis: Difficulty in walking, not elsewhere classified - Plan: PT plan of care cert/re-cert  Muscle weakness (generalized) - Plan: PT plan of care cert/re-cert       G-Codes - November 10, 2016 1108  Functional Assessment Tool Used (Outpatient Only) BERG, 10MWT, DGI, TUG, clinical judgement   Functional Limitation Mobility: Walking and moving around   Mobility: Walking and Moving Around Current Status [phone]) At least 20 percent but less than 40 percent impaired, limited or restricted   Mobility: Walking and Moving Around Goal Status [phone]) At least 1 percent but less than 20 percent impaired, limited or restricted      Problem List Patient Active Problem List   Diagnosis Date Noted  . Cough 03/20/2015  . Bronchitis, chronic obstructive w acute bronchitis ([place]) 03/20/2015  . Umbilical hernia without obstruction and without gangrene 01/12/2015  . Ventral hernia without obstruction or gangrene 01/02/2015   Phillips Grout PT, DPT   Twana Wileman 11/07/2016, 2:55 PM  [place] MAIN [facility] SERVICES [address] [place], [place], [address] Phone: [phone]   Fax:  [phone]  Name: Anees Vanecek. MRN: [id number] Date of Birth: [birth year]

## 2016-11-08 ENCOUNTER — Ambulatory Visit: Payer: PPO

## 2016-11-15 ENCOUNTER — Ambulatory Visit: Payer: PPO | Attending: Internal Medicine

## 2016-11-15 DIAGNOSIS — R269 Unspecified abnormalities of gait and mobility: Secondary | ICD-10-CM | POA: Diagnosis not present

## 2016-11-15 DIAGNOSIS — R262 Difficulty in walking, not elsewhere classified: Secondary | ICD-10-CM | POA: Diagnosis not present

## 2016-11-15 DIAGNOSIS — M6281 Muscle weakness (generalized): Secondary | ICD-10-CM

## 2016-11-15 NOTE — Therapy (Signed)
[place] MAIN [facility] SERVICES [address] [place], [place], [address] Phone: [phone]   Fax:  [phone]  Physical Therapy Treatment  Patient Details  Name: Daniel Eaton. MRN: [id number] Date of Birth: August 12, 1940 Referring Provider: Dr. Ouida Sills  Encounter Date: 11/15/2016      PT End of Session - 11/15/16 0857    Visit Number 40   Number of Visits 53   Date for PT Re-Evaluation 01/02/17   Authorization Type 2/10   PT Start Time 0847   PT Stop Time 0930   PT Time Calculation (min) 43 min   Equipment Utilized During Treatment Gait belt  min guard to S prn   Activity Tolerance Patient tolerated treatment well   Behavior During Therapy [facility] for tasks assessed/performed;Anxious      Past Medical History:  Diagnosis Date  . A-fib ([place])   . CHF (congestive heart failure) ([place])   . COPD (chronic obstructive pulmonary disease) ([place])   . Difficult intubation   . Hyperlipidemia   . Hypertension   . Hypothyroidism   . Kidney disease   . Sleep apnea   . Stroke ([place])   . Thyroid disease     Past Surgical History:  Procedure Laterality Date  . CARDIAC CATHETERIZATION    . CATARACT EXTRACTION W/ INTRAOCULAR LENS  IMPLANT, BILATERAL    . COLONOSCOPY  2008  . HERNIA REPAIR     bilateral inguinal hernia/ [place]  . HERNIA REPAIR  02/02/2015   18 x 28 cm ventral light mesh placed laparoscopically.  . TONSILLECTOMY    . UMBILICAL HERNIA REPAIR N/A 02/02/2015   Procedure: HERNIA REPAIR UMBILICAL ADULT;  Surgeon: Robert Bellow, MD;  Location: [facility] ORS;  Service: General;  Laterality: N/A;  . VENTRAL HERNIA REPAIR N/A 02/02/2015   Procedure: LAPAROSCOPIC VENTRAL HERNIA;  Surgeon: Robert Bellow, MD;  Location: [facility] ORS;  Service: General;  Laterality: N/A;  . vp shunt placement  1979    There were no vitals filed for this visit.      Subjective Assessment - 11/15/16 0853    Subjective Patient is having hip pain from  excessive work at [employer] Diuretics has fatigued patient, feels like he has to take excessive time to get ready in the morning.    Pertinent History Pt reports a history of rapid onset balance difficulty since 2014. He reports that the initial episode occurred due to improper use of diuretic with hypokalemia. One year later he had another episode with a fall. He was sent to ENT and had a VNG study which was normal. ENT also ordered a head CT and MRI. MRI showed chronic R MCA infarct. Pt comes for [employer] 2-3x/wk. He has completed pulmonary rehab at [facility] twice. Pt denies dizziness or vertigo but reports difficulty with his balance    Patient Stated Goals Improve balance so that he only needs cane or no assistive device   Currently in Pain? Yes   Pain Score 5    Pain Location Hip   Pain Orientation Right;Left   Pain Descriptors / Indicators Aching   Pain Type Chronic pain   Pain Onset In the past 7 days   Pain Frequency Constant   Aggravating Factors  walking     TherEx Sit to stand from plinth table from varying heights, progressively lowering table by 3 inches. Sore in quads and hips, 5x each level, required using hands on knees once at 19 inches, able to perform  2 STS from 17 inches; 15 sit to stands total   Seated Marches 20x Scapular retractions 20x  Red Theraband:  Standing hip abduction 15x each leg  Standing step forward 12x each leg  Standing hip extension 12x each leg   Stair stretch for hip flexors 60 seconds    Patient required frequent rest breaks due to LE fatigue.    Pt. response to medical necessity: Patient will benefit from continued skilled physical therapy to address deficits in weakness and balance in order to prevent functional decline, decrease fall risk, and improve his independence with self care tasks and functional mobility.                         PT Education - 11/15/16 0857    Education provided Yes   Education Details stretching  sore musculature   Person(s) Educated Patient   Methods Explanation;Demonstration;Verbal cues   Comprehension Verbalized understanding;Returned demonstration             PT Long Term Goals - 11/07/16 1034      PT LONG TERM GOAL #1   Title Pt will be independent with HEP in order to improve strength and balance in order to decrease fall risk and improve function at home and work.   Baseline continues to improve   Time 8   Period Weeks   Status On-going     PT LONG TERM GOAL #2   Title Pt will improve BERG to 48 points in order to demonstrate clinically significant improvement in balance.    Baseline 04/19/16: 38/56; 05/24/16: 16/10 06/16/16: 43/56 08/16/16: 47/56 7/31: 49/56; 11/07/16: 42/56   Time 8   Period Weeks   Status On-going     PT LONG TERM GOAL #3   Title Pt will decrease TUG to below 25 seconds in order to demonstrate decreased fall risk and improved LE strength      Baseline 04/19/16: 36.15 seconds; 05/24/16: 20sec; 06/16/16: 22.07 seconds;    Time 8   Period Weeks   Status Achieved     PT LONG TERM GOAL #4   Title Pt will increase 10MWT by at least 0.13 m/s in order to demonstrate clinically significant improvement in community ambulation.    Baseline 04/19/16: 0.75 m/s; 05/24/2016 .833; 06/16/16: 11.4s = 0.88 m/s 7/3: 13 m =.53m/s, 731: .714 9/4: .43; 11/07/16: 0.74 m/s   Time 8   Period Days   Status On-going     PT LONG TERM GOAL #5   Title Pt will decrease TUG to below 20 seconds in order to demonstrate decreased fall risk and improved LE strength      Baseline 04/19/16: 36.15 seconds; 05/24/16: 20sec; 06/16/16: 22.07 seconds; 11/07/16: unable to perform today due to weakness with transfers   Status On-going     PT LONG TERM GOAL #6   Title Patient will increase dynamic gait index score to >19/24 as to demonstrate reduced fall risk and improved dynamic gait balance for better safety with community/home ambulation.    Baseline 7/3: 14 8/7: 17/24; 11/07/16: 14/24   Time 6    Period Weeks   Status On-going               Plan - 11/15/16 0916    Clinical Impression Statement Patient is challenged by lower level seat, requiring hands on knees to assist. 17 inch seat is patient's limit. LE's fatigued quickly requiring frequent seated rest breaks. Patient will benefit from continued skilled  physical therapy to address deficits in weakness and balance in order to prevent functional decline, decrease fall risk, and improve his independence with self care tasks and functional mobility.    Rehab Potential Good   Clinical Impairments Affecting Rehab Potential Positive: motivation; Negative: chronic balance dysfunction, chronic CVA   PT Frequency 1x / week   PT Duration 8 weeks   PT Treatment/Interventions ADLs/Self Care Home Management;Cryotherapy;Moist Heat;Neuromuscular re-education;Gait training;Functional mobility training;Stair training;Therapeutic activities;Therapeutic exercise;Balance training;Manual techniques;Aquatic Therapy;Canalith Repostioning;Electrical Stimulation;Traction;Ultrasound;DME Instruction;Patient/family education;Passive range of motion;Energy conservation;Vestibular   PT Next Visit Plan goals   PT Home Exercise Plan As prescribed   Consulted and Agree with Plan of Care Patient      Patient will benefit from skilled therapeutic intervention in order to improve the following deficits and impairments:  Abnormal gait, Decreased activity tolerance, Decreased balance, Decreased strength, Difficulty walking, Obesity, Decreased coordination  Visit Diagnosis: Difficulty in walking, not elsewhere classified  Muscle weakness (generalized)  Gait difficulty     Problem List Patient Active Problem List   Diagnosis Date Noted  . Cough 03/20/2015  . Bronchitis, chronic obstructive w acute bronchitis ([place]) 03/20/2015  . Umbilical hernia without obstruction and without gangrene 01/12/2015  . Ventral hernia without obstruction or gangrene  01/02/2015   Janna Arch, PT, DPT   Janna Arch 11/15/2016, 9:28 AM  [place] MAIN [facility] SERVICES [address] [place], [place], [address] Phone: [phone]   Fax:  [phone]  Name: Gaberial Cada. MRN: [id number] Date of Birth: 03-25-1940

## 2016-11-17 DIAGNOSIS — Z6841 Body Mass Index (BMI) 40.0 and over, adult: Secondary | ICD-10-CM | POA: Diagnosis not present

## 2016-11-17 DIAGNOSIS — Z23 Encounter for immunization: Secondary | ICD-10-CM | POA: Diagnosis not present

## 2016-11-17 DIAGNOSIS — I129 Hypertensive chronic kidney disease with stage 1 through stage 4 chronic kidney disease, or unspecified chronic kidney disease: Secondary | ICD-10-CM | POA: Diagnosis not present

## 2016-11-17 DIAGNOSIS — G4733 Obstructive sleep apnea (adult) (pediatric): Secondary | ICD-10-CM | POA: Diagnosis not present

## 2016-11-17 DIAGNOSIS — I700 Atherosclerosis of aorta: Secondary | ICD-10-CM | POA: Diagnosis not present

## 2016-11-17 DIAGNOSIS — I429 Cardiomyopathy, unspecified: Secondary | ICD-10-CM | POA: Diagnosis not present

## 2016-11-17 DIAGNOSIS — E039 Hypothyroidism, unspecified: Secondary | ICD-10-CM | POA: Diagnosis not present

## 2016-11-17 DIAGNOSIS — J42 Unspecified chronic bronchitis: Secondary | ICD-10-CM | POA: Diagnosis not present

## 2016-11-17 DIAGNOSIS — I482 Chronic atrial fibrillation: Secondary | ICD-10-CM | POA: Diagnosis not present

## 2016-11-17 DIAGNOSIS — N183 Chronic kidney disease, stage 3 (moderate): Secondary | ICD-10-CM | POA: Diagnosis not present

## 2016-11-24 ENCOUNTER — Ambulatory Visit: Payer: PPO

## 2016-11-24 DIAGNOSIS — R262 Difficulty in walking, not elsewhere classified: Secondary | ICD-10-CM

## 2016-11-24 DIAGNOSIS — M6281 Muscle weakness (generalized): Secondary | ICD-10-CM

## 2016-11-24 DIAGNOSIS — R269 Unspecified abnormalities of gait and mobility: Secondary | ICD-10-CM

## 2016-11-24 NOTE — Therapy (Signed)
[place] MAIN [facility] SERVICES [address] [place], [place], [address] Phone: [phone]   Fax:  [phone]  Physical Therapy Treatment  Patient Details  Name: Daniel Eaton. MRN: [id number] Date of Birth: 12/06/1940 Referring Provider: Dr. Ouida Sills  Encounter Date: 11/24/2016      PT End of Session - 11/24/16 1130    Visit Number 41   Number of Visits 53   Date for PT Re-Evaluation 01/02/17   Authorization Type 3/10   PT Start Time 1119   PT Stop Time 1203   PT Time Calculation (min) 44 min   Equipment Utilized During Treatment Gait belt  min guard to S prn   Activity Tolerance Patient tolerated treatment well   Behavior During Therapy [facility] for tasks assessed/performed;Anxious      Past Medical History:  Diagnosis Date  . A-fib ([place])   . CHF (congestive heart failure) ([place])   . COPD (chronic obstructive pulmonary disease) ([place])   . Difficult intubation   . Hyperlipidemia   . Hypertension   . Hypothyroidism   . Kidney disease   . Sleep apnea   . Stroke ([place])   . Thyroid disease     Past Surgical History:  Procedure Laterality Date  . CARDIAC CATHETERIZATION    . CATARACT EXTRACTION W/ INTRAOCULAR LENS  IMPLANT, BILATERAL    . COLONOSCOPY  2008  . HERNIA REPAIR     bilateral inguinal hernia/ [facility]  . HERNIA REPAIR  02/02/2015   18 x 28 cm ventral light mesh placed laparoscopically.  . TONSILLECTOMY    . UMBILICAL HERNIA REPAIR N/A 02/02/2015   Procedure: HERNIA REPAIR UMBILICAL ADULT;  Surgeon: Robert Bellow, MD;  Location: [facility] ORS;  Service: General;  Laterality: N/A;  . VENTRAL HERNIA REPAIR N/A 02/02/2015   Procedure: LAPAROSCOPIC VENTRAL HERNIA;  Surgeon: Robert Bellow, MD;  Location: [facility] ORS;  Service: General;  Laterality: N/A;  . vp shunt placement  1979    There were no vitals filed for this visit.      Subjective Assessment - 11/24/16 1121    Subjective Patient went to [facility] and  was Nustep for 20 minutes. Feeling sore in thights, knees, and low back.    Pertinent History Pt reports a history of rapid onset balance difficulty since 2014. He reports that the initial episode occurred due to improper use of diuretic with hypokalemia. One year later he had another episode with a fall. He was sent to ENT and had a VNG study which was normal. ENT also ordered a head CT and MRI. MRI showed chronic R MCA infarct. Pt comes for [employer] 2-3x/wk. He has completed pulmonary rehab at [facility] twice. Pt denies dizziness or vertigo but reports difficulty with his balance    Patient Stated Goals Improve balance so that he only needs cane or no assistive device   Currently in Pain? Yes   Pain Score 1    Pain Location Knee   Pain Orientation Right;Left   Pain Descriptors / Indicators Sore   Pain Onset In the past 7 days   Pain Frequency Intermittent        Neuro Re-ed:  Static normal stance:  Eyes closed: 60 seconds no LOB  Horizontal head turns no LOB  Vertical head nods no LOB no trunk sway  Airex pad: 90 seconds no LOB  Step forward and backwards over half foam roller single UE support, no LOB 12x each leg  Side step over and back half foam roller in // bars, require UE assistance 10x each leg, fatigued quickly  Ambulating without walker 6x10 ft, CGA, shortened steps    TherEx:  Scapular retractions 20x with pursed lipped breathing  6" toe taps 30x, require SUE -> BUE support when fatigued.   Slow tempo leg extensions in // bars with prolonged hold and BUE support 15x each leg  #4 ankle weights seated  Marches 20x, scapular retractions simultaneously   Knee extensions 12x each leg     Pt. response to medical necessity: Patient will benefit from continued skilled physical therapy to address deficits in weakness and balance in order to prevent functional decline, decrease fall risk, and improve his independence with self care tasks and functional  mobility.                                 PT Long Term Goals - 11/07/16 1034      PT LONG TERM GOAL #1   Title Pt will be independent with HEP in order to improve strength and balance in order to decrease fall risk and improve function at home and work.   Baseline continues to improve   Time 8   Period Weeks   Status On-going     PT LONG TERM GOAL #2   Title Pt will improve BERG to 48 points in order to demonstrate clinically significant improvement in balance.    Baseline 04/19/16: 38/56; 05/24/16: 33/35 06/16/16: 43/56 08/16/16: 47/56 7/31: 49/56; 11/07/16: 42/56   Time 8   Period Weeks   Status On-going     PT LONG TERM GOAL #3   Title Pt will decrease TUG to below 25 seconds in order to demonstrate decreased fall risk and improved LE strength      Baseline 04/19/16: 36.15 seconds; 05/24/16: 20sec; 06/16/16: 22.07 seconds;    Time 8   Period Weeks   Status Achieved     PT LONG TERM GOAL #4   Title Pt will increase 10MWT by at least 0.13 m/s in order to demonstrate clinically significant improvement in community ambulation.    Baseline 04/19/16: 0.75 m/s; 05/24/2016 .833; 06/16/16: 11.4s = 0.88 m/s 7/3: 13 m =.57m/s, 731: .714 9/4: .43; 11/07/16: 0.74 m/s   Time 8   Period Days   Status On-going     PT LONG TERM GOAL #5   Title Pt will decrease TUG to below 20 seconds in order to demonstrate decreased fall risk and improved LE strength      Baseline 04/19/16: 36.15 seconds; 05/24/16: 20sec; 06/16/16: 22.07 seconds; 11/07/16: unable to perform today due to weakness with transfers   Status On-going     PT LONG TERM GOAL #6   Title Patient will increase dynamic gait index score to >19/24 as to demonstrate reduced fall risk and improved dynamic gait balance for better safety with community/home ambulation.    Baseline 7/3: 14 8/7: 17/24; 11/07/16: 14/24   Time 6   Period Weeks   Status On-going               Plan - 11/24/16 1147    Clinical Impression Statement  Patient progressing with functional capacity of standing interventions, performing prolonged standing periods with decreased need for seated breaks. Frequent breaks standing were performed between trials. Patient will benefit from continued skilled physical therapy to address deficits in weakness and balance in order to prevent functional  decline, decrease fall risk, and improve his independence with self care tasks and functional mobility.   Rehab Potential Good   Clinical Impairments Affecting Rehab Potential Positive: motivation; Negative: chronic balance dysfunction, chronic CVA   PT Frequency 1x / week   PT Duration 8 weeks   PT Treatment/Interventions ADLs/Self Care Home Management;Cryotherapy;Moist Heat;Neuromuscular re-education;Gait training;Functional mobility training;Stair training;Therapeutic activities;Therapeutic exercise;Balance training;Manual techniques;Aquatic Therapy;Canalith Repostioning;Electrical Stimulation;Traction;Ultrasound;DME Instruction;Patient/family education;Passive range of motion;Energy conservation;Vestibular   PT Next Visit Plan goals   PT Home Exercise Plan As prescribed   Consulted and Agree with Plan of Care Patient      Patient will benefit from skilled therapeutic intervention in order to improve the following deficits and impairments:  Abnormal gait, Decreased activity tolerance, Decreased balance, Decreased strength, Difficulty walking, Obesity, Decreased coordination  Visit Diagnosis: Difficulty in walking, not elsewhere classified  Muscle weakness (generalized)  Gait difficulty     Problem List Patient Active Problem List   Diagnosis Date Noted  . Cough 03/20/2015  . Bronchitis, chronic obstructive w acute bronchitis ([place]) 03/20/2015  . Umbilical hernia without obstruction and without gangrene 01/12/2015  . Ventral hernia without obstruction or gangrene 01/02/2015   Janna Arch, PT, DPT   Janna Arch 11/24/2016, 12:41 PM  [place] MAIN [facility] SERVICES [address] [place], [place], [address] Phone: [phone]   Fax:  [phone]  Name: Daniel Eaton. MRN: [id number] Date of Birth: July 18, 1940

## 2016-11-26 DIAGNOSIS — G4733 Obstructive sleep apnea (adult) (pediatric): Secondary | ICD-10-CM | POA: Diagnosis not present

## 2016-11-28 ENCOUNTER — Ambulatory Visit: Payer: PPO | Admitting: Physical Therapy

## 2016-11-28 ENCOUNTER — Encounter: Payer: Self-pay | Admitting: Physical Therapy

## 2016-11-28 VITALS — BP 135/57

## 2016-11-28 DIAGNOSIS — R262 Difficulty in walking, not elsewhere classified: Secondary | ICD-10-CM | POA: Diagnosis not present

## 2016-11-28 DIAGNOSIS — M6281 Muscle weakness (generalized): Secondary | ICD-10-CM

## 2016-11-28 DIAGNOSIS — R269 Unspecified abnormalities of gait and mobility: Secondary | ICD-10-CM

## 2016-11-28 NOTE — Therapy (Signed)
[place] MAIN [facility] SERVICES [address] [place], [place], [address] Phone: [phone]   Fax:  [phone]  Physical Therapy Treatment  Patient Details  Name: Daniel Eaton. MRN: [id number] Date of Birth: April 16, 1940 Referring Provider: Dr. Ouida Sills  Encounter Date: 11/28/2016      PT End of Session - 11/28/16 1356    Visit Number 42   Number of Visits 53   Date for PT Re-Evaluation 01/02/17   Authorization Type 4/10   PT Start Time 7510   PT Stop Time 1429   PT Time Calculation (min) 34 min   Equipment Utilized During Treatment Gait belt  min guard to S prn   Activity Tolerance Patient tolerated treatment well   Behavior During Therapy [facility] for tasks assessed/performed;Anxious      Past Medical History:  Diagnosis Date  . A-fib ([place])   . CHF (congestive heart failure) ([place])   . COPD (chronic obstructive pulmonary disease) ([place])   . Difficult intubation   . Hyperlipidemia   . Hypertension   . Hypothyroidism   . Kidney disease   . Sleep apnea   . Stroke ([place])   . Thyroid disease     Past Surgical History:  Procedure Laterality Date  . CARDIAC CATHETERIZATION    . CATARACT EXTRACTION W/ INTRAOCULAR LENS  IMPLANT, BILATERAL    . COLONOSCOPY  2008  . HERNIA REPAIR     bilateral inguinal hernia/ [place]  . HERNIA REPAIR  02/02/2015   18 x 28 cm ventral light mesh placed laparoscopically.  . TONSILLECTOMY    . UMBILICAL HERNIA REPAIR N/A 02/02/2015   Procedure: HERNIA REPAIR UMBILICAL ADULT;  Surgeon: Robert Bellow, MD;  Location: [facility] ORS;  Service: General;  Laterality: N/A;  . VENTRAL HERNIA REPAIR N/A 02/02/2015   Procedure: LAPAROSCOPIC VENTRAL HERNIA;  Surgeon: Robert Bellow, MD;  Location: [facility] ORS;  Service: General;  Laterality: N/A;  . vp shunt placement  1979    Vitals:   11/28/16 1356  BP: (!) 135/57        Subjective Assessment - 11/28/16 1356    Subjective Pt arrived late to his  appointment, limiting session. Pt is having some lower back discomfort which he attributes to spending a long time driving this past weekend.  Additionally pt reports he has been "eating junk I know I'm not supposed to" and as a result his BLE swelling has increased.  He is planning to take his diuretic as prescribed this afternoon when he gets home.  Pt verbalized that he understands he needs to contact his MD if this worsens or changes.     Pertinent History Pt reports a history of rapid onset balance difficulty since 2014. He reports that the initial episode occurred due to improper use of diuretic with hypokalemia. One year later he had another episode with a fall. He was sent to ENT and had a VNG study which was normal. ENT also ordered a head CT and MRI. MRI showed chronic R MCA infarct. Pt comes for [employer] 2-3x/wk. He has completed pulmonary rehab at [facility] twice. Pt denies dizziness or vertigo but reports difficulty with his balance    Patient Stated Goals Improve balance so that he only needs cane or no assistive device   Currently in Pain? Yes   Pain Score 3    Pain Location Back   Pain Orientation Lower   Pain Descriptors / Indicators Discomfort   Pain Type Chronic pain  Pain Onset --   Multiple Pain Sites No       TREATMENT  Neuro Re-ed:  Static normal stance:  Eyes closed: 60 seconds no LOB  Horizontal head turns no LOB  Vertical head nods no LOB no trunk sway  ?  Therapeutic Exercise:  Mini squats with intermittent UE support 2x10.  Marching in standing x15 each LE with intermittent UE support.  Lateral walking x6 lengths in // bars with cues for slight knee bend for greater glute recruitment. 6" toe taps 20x, require SUE.  Seated LAQ with 3# ankle weights x10 each LE  Seated marching with 3# ankle weights x10 each LE                  PT Education - 11/28/16 1356    Education provided Yes   Education Details Exercise technique   Person(s) Educated  Patient   Methods Explanation;Demonstration;Verbal cues   Comprehension Returned demonstration;Verbalized understanding;Verbal cues required             PT Long Term Goals - 11/07/16 1034      PT LONG TERM GOAL #1   Title Pt will be independent with HEP in order to improve strength and balance in order to decrease fall risk and improve function at home and work.   Baseline continues to improve   Time 8   Period Weeks   Status On-going     PT LONG TERM GOAL #2   Title Pt will improve BERG to 48 points in order to demonstrate clinically significant improvement in balance.    Baseline 04/19/16: 38/56; 05/24/16: 62/69 06/16/16: 43/56 08/16/16: 47/56 7/31: 49/56; 11/07/16: 42/56   Time 8   Period Weeks   Status On-going     PT LONG TERM GOAL #3   Title Pt will decrease TUG to below 25 seconds in order to demonstrate decreased fall risk and improved LE strength      Baseline 04/19/16: 36.15 seconds; 05/24/16: 20sec; 06/16/16: 22.07 seconds;    Time 8   Period Weeks   Status Achieved     PT LONG TERM GOAL #4   Title Pt will increase 10MWT by at least 0.13 m/s in order to demonstrate clinically significant improvement in community ambulation.    Baseline 04/19/16: 0.75 m/s; 05/24/2016 .833; 06/16/16: 11.4s = 0.88 m/s 7/3: 13 m =.73m/s, 731: .714 9/4: .43; 11/07/16: 0.74 m/s   Time 8   Period Days   Status On-going     PT LONG TERM GOAL #5   Title Pt will decrease TUG to below 20 seconds in order to demonstrate decreased fall risk and improved LE strength      Baseline 04/19/16: 36.15 seconds; 05/24/16: 20sec; 06/16/16: 22.07 seconds; 11/07/16: unable to perform today due to weakness with transfers   Status On-going     PT LONG TERM GOAL #6   Title Patient will increase dynamic gait index score to >19/24 as to demonstrate reduced fall risk and improved dynamic gait balance for better safety with community/home ambulation.    Baseline 7/3: 14 8/7: 17/24; 11/07/16: 14/24   Time 6   Period Weeks    Status On-going               Plan - 11/28/16 1409    Clinical Impression Statement Pt demonstrates and reports increased BLE swelling due to poor diet which he has modified and reports he is planning to take his medication as prescribed once he returns home after session.  Due to this he required several standing rest breaks due to fatigue and exercises completed in sitting at end of session.  He demonstrates instability with balance exercises, requiring intermittent UE support to stabilize. Pt will benefit from continued skilled PT interventions for improved strength and balance.    Rehab Potential Good   Clinical Impairments Affecting Rehab Potential Positive: motivation; Negative: chronic balance dysfunction, chronic CVA   PT Frequency 1x / week   PT Duration 8 weeks   PT Treatment/Interventions ADLs/Self Care Home Management;Cryotherapy;Moist Heat;Neuromuscular re-education;Gait training;Functional mobility training;Stair training;Therapeutic activities;Therapeutic exercise;Balance training;Manual techniques;Aquatic Therapy;Canalith Repostioning;Electrical Stimulation;Traction;Ultrasound;DME Instruction;Patient/family education;Passive range of motion;Energy conservation;Vestibular   PT Next Visit Plan goals   PT Home Exercise Plan As prescribed   Consulted and Agree with Plan of Care Patient      Patient will benefit from skilled therapeutic intervention in order to improve the following deficits and impairments:  Abnormal gait, Decreased activity tolerance, Decreased balance, Decreased strength, Difficulty walking, Obesity, Decreased coordination  Visit Diagnosis: Difficulty in walking, not elsewhere classified  Muscle weakness (generalized)  Gait difficulty  Muscle weakness     Problem List Patient Active Problem List   Diagnosis Date Noted  . Cough 03/20/2015  . Bronchitis, chronic obstructive w acute bronchitis ([place]) 03/20/2015  . Umbilical hernia without  obstruction and without gangrene 01/12/2015  . Ventral hernia without obstruction or gangrene 01/02/2015    Collie Siad PT, DPT 11/28/2016, 2:27 PM  [name] [facility] SERVICES [address] [place], [place], [address] Phone: [phone]   Fax:  [phone]  Name: Daniel Eaton. MRN: [id number] Date of Birth: Jun 12, 1940

## 2016-12-01 DIAGNOSIS — H35373 Puckering of macula, bilateral: Secondary | ICD-10-CM | POA: Diagnosis not present

## 2016-12-06 ENCOUNTER — Ambulatory Visit: Payer: PPO

## 2016-12-07 ENCOUNTER — Ambulatory Visit: Payer: PPO

## 2016-12-07 DIAGNOSIS — R262 Difficulty in walking, not elsewhere classified: Secondary | ICD-10-CM | POA: Diagnosis not present

## 2016-12-07 DIAGNOSIS — M6281 Muscle weakness (generalized): Secondary | ICD-10-CM

## 2016-12-07 NOTE — Therapy (Signed)
[place] MAIN [facility] SERVICES [address] [place], [place], [address] Phone: [phone]   Fax:  [phone]  Physical Therapy Treatment  Patient Details  Name: Daniel Eaton. MRN: [id number] Date of Birth: 05/19/1940 Referring Provider: Dr. Ouida Sills  Encounter Date: 12/07/2016      PT End of Session - 12/07/16 1433    Visit Number 43   Number of Visits 53   Date for PT Re-Evaluation 01/02/17   Authorization Type 5/10   PT Start Time 5188   PT Stop Time 1432   PT Time Calculation (min) 35 min   Equipment Utilized During Treatment Gait belt  min guard to S prn   Activity Tolerance Patient tolerated treatment well   Behavior During Therapy WFL for tasks assessed/performed      Past Medical History:  Diagnosis Date  . A-fib ([place])   . CHF (congestive heart failure) ([place])   . COPD (chronic obstructive pulmonary disease) ([place])   . Difficult intubation   . Hyperlipidemia   . Hypertension   . Hypothyroidism   . Kidney disease   . Sleep apnea   . Stroke ([place])   . Thyroid disease     Past Surgical History:  Procedure Laterality Date  . CARDIAC CATHETERIZATION    . CATARACT EXTRACTION W/ INTRAOCULAR LENS  IMPLANT, BILATERAL    . COLONOSCOPY  2008  . HERNIA REPAIR     bilateral inguinal hernia/ [place]  . HERNIA REPAIR  02/02/2015   18 x 28 cm ventral light mesh placed laparoscopically.  . TONSILLECTOMY    . UMBILICAL HERNIA REPAIR N/A 02/02/2015   Procedure: HERNIA REPAIR UMBILICAL ADULT;  Surgeon: Robert Bellow, MD;  Location: [facility] ORS;  Service: General;  Laterality: N/A;  . VENTRAL HERNIA REPAIR N/A 02/02/2015   Procedure: LAPAROSCOPIC VENTRAL HERNIA;  Surgeon: Robert Bellow, MD;  Location: [facility] ORS;  Service: General;  Laterality: N/A;  . vp shunt placement  1979    There were no vitals filed for this visit.      Subjective Assessment - 12/07/16 1404    Subjective Patient late to his session. Not doing HEP  right now, agrees to set alarm clock to remind self.    Pertinent History Pt reports a history of rapid onset balance difficulty since 2014. He reports that the initial episode occurred due to improper use of diuretic with hypokalemia. One year later he had another episode with a fall. He was sent to ENT and had a VNG study which was normal. ENT also ordered a head CT and MRI. MRI showed chronic R MCA infarct. Pt comes for [employer] 2-3x/wk. He has completed pulmonary rehab at [facility] twice. Pt denies dizziness or vertigo but reports difficulty with his balance    Patient Stated Goals Improve balance so that he only needs cane or no assistive device   Currently in Pain? No/denies    BERG 45/56 DGI 14/24   Ambulate with no AD 60 ft, rest breaks every 20 ft. Cues for upright posture.   Sit to stand transfers: varying height plinth   cane training next session      [facility] PT Assessment - 12/07/16 0001      Berg Balance Test   Sit to Stand Able to stand without using hands and stabilize independently   Standing Unsupported Able to stand safely 2 minutes   Sitting with Back Unsupported but Feet Supported on Floor or Stool Able to sit safely  and securely 2 minutes   Stand to Sit Sits safely with minimal use of hands   Transfers Able to transfer safely, definite need of hands   Standing Unsupported with Eyes Closed Able to stand 10 seconds safely   Standing Ubsupported with Feet Together Able to place feet together independently and stand for 1 minute with supervision   From Standing, Reach Forward with Outstretched Arm Can reach forward >12 cm safely (5")   From Standing Position, Pick up Object from [place] to pick up shoe safely and easily   From Standing Position, Turn to Look Behind Over each Shoulder Looks behind from both sides and weight shifts well   Turn 360 Degrees Able to turn 360 degrees safely but slowly   Standing Unsupported, Alternately Place Feet on Step/Stool Able to complete  4 steps without aid or supervision   Standing Unsupported, One Foot in Front Able to plae foot ahead of the other independently and hold 30 seconds   Standing on One Leg Tries to lift leg/unable to hold 3 seconds but remains standing independently   Total Score 45     Dynamic Gait Index   Level Surface Mild Impairment   Change in Gait Speed Mild Impairment   Gait with Horizontal Head Turns Mild Impairment   Gait with Vertical Head Turns Mild Impairment   Gait and Pivot Turn Mild Impairment   Step Over Obstacle Moderate Impairment   Step Around Obstacles Mild Impairment   Steps Moderate Impairment   Total Score 14                                  PT Long Term Goals - 12/07/16 1439      PT LONG TERM GOAL #1   Title Pt will be independent with HEP in order to improve strength and balance in order to decrease fall risk and improve function at home and work.   Baseline continues to improve   Time 8   Period Weeks   Status On-going     PT LONG TERM GOAL #2   Title Pt will improve BERG to 48 points in order to demonstrate clinically significant improvement in balance.    Baseline 04/19/16: 38/56; 05/24/16: 85/27 06/16/16: 43/56 08/16/16: 47/56 7/31: 49/56; 11/07/16: 42/56 10/24: 45/56   Time 8   Period Weeks   Status Partially Met     PT LONG TERM GOAL #3   Title Pt will decrease TUG to below 25 seconds in order to demonstrate decreased fall risk and improved LE strength      Baseline 04/19/16: 36.15 seconds; 05/24/16: 20sec; 06/16/16: 22.07 seconds;    Time 8   Period Weeks   Status Achieved     PT LONG TERM GOAL #4   Title Pt will increase 10MWT by at least 0.13 m/s in order to demonstrate clinically significant improvement in community ambulation.    Baseline 04/19/16: 0.75 m/s; 05/24/2016 .833; 06/16/16: 11.4s = 0.88 m/s 7/3: 13 m =.65ms, 731: .714 9/4: .43; 11/07/16: 0.74 m/s   Time 8   Period Days   Status On-going     PT LONG TERM GOAL #5   Title Pt will  decrease TUG to below 20 seconds in order to demonstrate decreased fall risk and improved LE strength      Baseline 04/19/16: 36.15 seconds; 05/24/16: 20sec; 06/16/16: 22.07 seconds; 11/07/16: unable to perform today due to weakness with transfers  Status On-going     PT LONG TERM GOAL #6   Title Patient will increase dynamic gait index score to >19/24 as to demonstrate reduced fall risk and improved dynamic gait balance for better safety with community/home ambulation.    Baseline 7/3: 14 8/7: 17/24; 11/07/16: 14/24 10/24: 14/24   Time 6   Period Weeks   Status On-going               Plan - 12/07/16 1438    Clinical Impression Statement Patient session short today due to patient arriving late. Pt. Required frequent rest breaks due to fatigue. BERG improved to 45/56 and DGI=14/24. Ambulatory capacity and steadiness without AD improving with no episodes of LOB.  Patient will continue to benefit from skilled PT intervention for improved strength and balance.    Rehab Potential Good   Clinical Impairments Affecting Rehab Potential Positive: motivation; Negative: chronic balance dysfunction, chronic CVA   PT Frequency 1x / week   PT Duration 8 weeks   PT Treatment/Interventions ADLs/Self Care Home Management;Cryotherapy;Moist Heat;Neuromuscular re-education;Gait training;Functional mobility training;Stair training;Therapeutic activities;Therapeutic exercise;Balance training;Manual techniques;Aquatic Therapy;Canalith Repostioning;Electrical Stimulation;Traction;Ultrasound;DME Instruction;Patient/family education;Passive range of motion;Energy conservation;Vestibular   PT Next Visit Plan walk with cane   PT Home Exercise Plan As prescribed   Consulted and Agree with Plan of Care Patient      Patient will benefit from skilled therapeutic intervention in order to improve the following deficits and impairments:  Abnormal gait, Decreased activity tolerance, Decreased balance, Decreased strength,  Difficulty walking, Obesity, Decreased coordination  Visit Diagnosis: Difficulty in walking, not elsewhere classified  Muscle weakness (generalized)     Problem List Patient Active Problem List   Diagnosis Date Noted  . Cough 03/20/2015  . Bronchitis, chronic obstructive w acute bronchitis ([place]) 03/20/2015  . Umbilical hernia without obstruction and without gangrene 01/12/2015  . Ventral hernia without obstruction or gangrene 01/02/2015   Janna Arch, PT, DPT   Janna Arch 12/07/2016, 2:40 PM  [place] MAIN [facility] SERVICES [address] [place], [place], [address] Phone: [phone]   Fax:  [phone]  Name: Keenon Leitzel. MRN: [id number] Date of Birth: 14-Mar-1940

## 2016-12-13 ENCOUNTER — Ambulatory Visit: Payer: PPO

## 2016-12-13 DIAGNOSIS — M79674 Pain in right toe(s): Secondary | ICD-10-CM | POA: Diagnosis not present

## 2016-12-13 DIAGNOSIS — M79675 Pain in left toe(s): Secondary | ICD-10-CM | POA: Diagnosis not present

## 2016-12-13 DIAGNOSIS — B351 Tinea unguium: Secondary | ICD-10-CM | POA: Diagnosis not present

## 2016-12-14 ENCOUNTER — Ambulatory Visit: Payer: PPO

## 2016-12-14 DIAGNOSIS — M6281 Muscle weakness (generalized): Secondary | ICD-10-CM

## 2016-12-14 DIAGNOSIS — R262 Difficulty in walking, not elsewhere classified: Secondary | ICD-10-CM

## 2016-12-14 NOTE — Therapy (Signed)
[place] MAIN [facility] SERVICES [address] [place], [place], [address] Phone: [phone]   Fax:  [phone]  Physical Therapy Treatment  Patient Details  Name: Daniel Eaton. MRN: [id number] Date of Birth: 01-12-1941 Referring Provider: Dr. Ouida Sills  Encounter Date: 12/14/2016      PT End of Session - 12/14/16 1443    Visit Number 44   Number of Visits 53   Date for PT Re-Evaluation 01/02/17   Authorization Type 6/10   PT Start Time 5284   PT Stop Time 1515   PT Time Calculation (min) 38 min   Equipment Utilized During Treatment Gait belt  min guard to S prn   Activity Tolerance Patient tolerated treatment well   Behavior During Therapy WFL for tasks assessed/performed      Past Medical History:  Diagnosis Date  . A-fib ([place])   . CHF (congestive heart failure) ([place])   . COPD (chronic obstructive pulmonary disease) ([place])   . Difficult intubation   . Hyperlipidemia   . Hypertension   . Hypothyroidism   . Kidney disease   . Sleep apnea   . Stroke ([place])   . Thyroid disease     Past Surgical History:  Procedure Laterality Date  . CARDIAC CATHETERIZATION    . CATARACT EXTRACTION W/ INTRAOCULAR LENS  IMPLANT, BILATERAL    . COLONOSCOPY  2008  . HERNIA REPAIR     bilateral inguinal hernia/ [place]  . HERNIA REPAIR  02/02/2015   18 x 28 cm ventral light mesh placed laparoscopically.  . TONSILLECTOMY    . UMBILICAL HERNIA REPAIR N/A 02/02/2015   Procedure: HERNIA REPAIR UMBILICAL ADULT;  Surgeon: Robert Bellow, MD;  Location: [facility] ORS;  Service: General;  Laterality: N/A;  . VENTRAL HERNIA REPAIR N/A 02/02/2015   Procedure: LAPAROSCOPIC VENTRAL HERNIA;  Surgeon: Robert Bellow, MD;  Location: [facility] ORS;  Service: General;  Laterality: N/A;  . vp shunt placement  1979    There were no vitals filed for this visit.      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 Has been doing HEP every day ( all the seated ones).    Pertinent History Pt reports a history of rapid onset balance difficulty since 2014. He reports that the initial episode occurred due to improper use of diuretic with hypokalemia. One year later he had another episode with a fall. He was sent to ENT and had a VNG study which was normal. ENT also ordered a head CT and MRI. MRI showed chronic R MCA infarct. Pt comes for [employer] 2-3x/wk. He has completed pulmonary rehab at [facility] twice. Pt denies dizziness or vertigo but reports difficulty with his balance    Patient Stated Goals Improve balance so that he only needs cane or no assistive device   Currently in Pain? No/denies      TherEx 10x Sit to stand , scapular retraction at top of stand   Scapular retractions with purse lipped breathing 10x   Step over and back over orange hurdle BUE support 10x each leg.   Side step over and back over orange hurdle BUE support 10x each leg   Standing hip/LE extensions 12x each leg   Ambulate with QC and 96 x2 . CGA. One stop for standing breathing.   Airex pad: 60 seconds no LOB Airex pad: balloon toss  PT Long Term Goals - 12/07/16 1439      PT LONG TERM GOAL #1   Title Pt will be independent with HEP in order to improve strength and balance in order to decrease fall risk and improve function at home and work.   Baseline continues to improve   Time 8   Period Weeks   Status On-going     PT LONG TERM GOAL #2   Title Pt will improve BERG to 48 points in order to demonstrate clinically significant improvement in balance.    Baseline 04/19/16: 38/56; 05/24/16: 12/75 06/16/16: 43/56 08/16/16: 47/56 7/31: 49/56; 11/07/16: 42/56 10/24: 45/56   Time 8   Period Weeks   Status Partially Met     PT LONG TERM GOAL #3   Title Pt will decrease TUG to below 25 seconds in order to demonstrate decreased fall risk and  improved LE strength      Baseline 04/19/16: 36.15 seconds; 05/24/16: 20sec; 06/16/16: 22.07 seconds;    Time 8   Period Weeks   Status Achieved     PT LONG TERM GOAL #4   Title Pt will increase 10MWT by at least 0.13 m/s in order to demonstrate clinically significant improvement in community ambulation.    Baseline 04/19/16: 0.75 m/s; 05/24/2016 .833; 06/16/16: 11.4s = 0.88 m/s 7/3: 13 m =.51ms, 731: .714 9/4: .43; 11/07/16: 0.74 m/s   Time 8   Period Days   Status On-going     PT LONG TERM GOAL #5   Title Pt will decrease TUG to below 20 seconds in order to demonstrate decreased fall risk and improved LE strength      Baseline 04/19/16: 36.15 seconds; 05/24/16: 20sec; 06/16/16: 22.07 seconds; 11/07/16: unable to perform today due to weakness with transfers   Status On-going     PT LONG TERM GOAL #6   Title Patient will increase dynamic gait index score to >19/24 as to demonstrate reduced fall risk and improved dynamic gait balance for better safety with community/home ambulation.    Baseline 7/3: 14 8/7: 17/24; 11/07/16: 14/24 10/24: 14/24   Time 6   Period Weeks   Status On-going               Plan - 12/14/16 1459    Clinical Impression Statement Patient progressing with longer duration ambulation with decreased assistance from device. Sit to stands performed without need for break demonstrating improved capacity for functional activity. Patient will continue to benefit from skilled physical therapy to improve strength and balance.    Rehab Potential Good   Clinical Impairments Affecting Rehab Potential Positive: motivation; Negative: chronic balance dysfunction, chronic CVA   PT Frequency 1x / week   PT Duration 8 weeks   PT Treatment/Interventions ADLs/Self Care Home Management;Cryotherapy;Moist Heat;Neuromuscular re-education;Gait training;Functional mobility training;Stair training;Therapeutic activities;Therapeutic exercise;Balance training;Manual techniques;Aquatic Therapy;Canalith  Repostioning;Electrical Stimulation;Traction;Ultrasound;DME Instruction;Patient/family education;Passive range of motion;Energy conservation;Vestibular   PT Next Visit Plan walk with cane   PT Home Exercise Plan As prescribed   Consulted and Agree with Plan of Care Patient      Patient will benefit from skilled therapeutic intervention in order to improve the following deficits and impairments:  Abnormal gait, Decreased activity tolerance, Decreased balance, Decreased strength, Difficulty walking, Obesity, Decreased coordination  Visit Diagnosis: Difficulty in walking, not elsewhere classified  Muscle weakness (generalized)     Problem List Patient Active Problem List   Diagnosis Date Noted  . Cough 03/20/2015  . Bronchitis, chronic obstructive w acute bronchitis ([place]  03/20/2015  . Umbilical hernia without obstruction and without gangrene 01/12/2015  . Ventral hernia without obstruction or gangrene 01/02/2015   Janna Arch, PT, DPT   Janna Arch 12/14/2016, 3:15 PM  [place] MAIN [facility] SERVICES [address] [place], [place], [address] Phone: [phone]   Fax:  [phone]  Name: Aryaman Haliburton. MRN: [id number] Date of Birth: 1940-06-24

## 2016-12-23 ENCOUNTER — Ambulatory Visit: Payer: PPO | Attending: Internal Medicine

## 2016-12-23 DIAGNOSIS — M6281 Muscle weakness (generalized): Secondary | ICD-10-CM | POA: Insufficient documentation

## 2016-12-23 DIAGNOSIS — R262 Difficulty in walking, not elsewhere classified: Secondary | ICD-10-CM | POA: Insufficient documentation

## 2016-12-23 DIAGNOSIS — R269 Unspecified abnormalities of gait and mobility: Secondary | ICD-10-CM

## 2016-12-23 NOTE — Therapy (Signed)
[place] MAIN [facility] SERVICES [address] [place], [place], [address] Phone: [phone]   Fax:  [phone]  Physical Therapy Treatment  Patient Details  Name: Daniel Eaton. MRN: [id number] Date of Birth: 1940/12/01 Referring Provider: Dr. Ouida Sills   Encounter Date: 12/23/2016  PT End of Session - 12/23/16 0930    Visit Number  45    Number of Visits  53    Date for PT Re-Evaluation  01/02/17    Authorization Type  7/10    PT Start Time  0916    PT Stop Time  1000    PT Time Calculation (min)  44 min    Equipment Utilized During Treatment  Gait belt min guard to S prn    Activity Tolerance  Patient tolerated treatment well    Behavior During Therapy  WFL for tasks assessed/performed       Past Medical History:  Diagnosis Date  . A-fib ([place])   . CHF (congestive heart failure) ([place])   . COPD (chronic obstructive pulmonary disease) ([place])   . Difficult intubation   . Hyperlipidemia   . Hypertension   . Hypothyroidism   . Kidney disease   . Sleep apnea   . Stroke ([place])   . Thyroid disease     Past Surgical History:  Procedure Laterality Date  . CARDIAC CATHETERIZATION    . CATARACT EXTRACTION W/ INTRAOCULAR LENS  IMPLANT, BILATERAL    . COLONOSCOPY  2008  . HERNIA REPAIR     bilateral inguinal hernia/ [place]  . HERNIA REPAIR  02/02/2015   18 x 28 cm ventral light mesh placed laparoscopically.  . TONSILLECTOMY    . vp shunt placement  1979    There were no vitals filed for this visit.  Subjective Assessment - 12/23/16 0935    Subjective  Patient compliant with HEP, was slightly sore after last session. 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     Pertinent History  Pt reports a history of rapid onset balance difficulty since 2014. He reports that the initial episode occurred due to improper use of diuretic with hypokalemia. One year later he had another episode with a fall. He was sent to ENT and had a VNG  study which was normal. ENT also ordered a head CT and MRI. MRI showed chronic R MCA infarct. Pt comes for [employer] 2-3x/wk. He has completed pulmonary rehab at [facility] twice. Pt denies dizziness or vertigo but reports difficulty with his balance     Patient Stated Goals  Improve balance so that he only needs cane or no assistive device    Currently in Pain?  No/denies      Sit to stand no UE assistance with UE raises at top from lowered plinth table 2x5    Seated marches 30 , cues for tall posture   Seated knee extensions 15x each leg cues for tall posture  Purse lipped breathing with scapular retractions  Ambule 90 ft with SPC and CGA, two standing rest breaks to catch breath.    Standing hip/LE extensions 15x each leg   Standing hip/LE flexion 15x each leg  6" step toe taps 20x BUE support   Seated hamstring stretch 2x 60 seconds    Airex pad: 60 seconds no LOB, bilateral leg shaking  Balloon toss while static standing to challenge BOS   Weighted ball toss with static stand no LOB, occasional dropping of ball.    Frequent  breaks for breathing required.    Pt. response to medical necessity: . Patient will continue to benefit from skilled physical therapy to improve strength and balance.                PT Education - 12/23/16 0935    Education provided  Yes    Education Details  sit to stand body mechanics    Person(s) Educated  Patient    Methods  Explanation;Demonstration;Verbal cues    Comprehension  Verbalized understanding;Returned demonstration          PT Long Term Goals - 12/07/16 1439      PT LONG TERM GOAL #1   Title  Pt will be independent with HEP in order to improve strength and balance in order to decrease fall risk and improve function at home and work.    Baseline  continues to improve    Time  8    Period  Weeks    Status  On-going      PT LONG TERM GOAL #2   Title  Pt will improve BERG to 48 points in order to demonstrate  clinically significant improvement in balance.     Baseline  04/19/16: 38/56; 05/24/16: 03/54 06/16/16: 43/56 08/16/16: 47/56 7/31: 49/56; 11/07/16: 42/56 10/24: 45/56    Time  8    Period  Weeks    Status  Partially Met      PT LONG TERM GOAL #3   Title  Pt will decrease TUG to below 25 seconds in order to demonstrate decreased fall risk and improved LE strength       Baseline  04/19/16: 36.15 seconds; 05/24/16: 20sec; 06/16/16: 22.07 seconds;     Time  8    Period  Weeks    Status  Achieved      PT LONG TERM GOAL #4   Title  Pt will increase 10MWT by at least 0.13 m/s in order to demonstrate clinically significant improvement in community ambulation.     Baseline  04/19/16: 0.75 m/s; 05/24/2016 .833; 06/16/16: 11.4s = 0.88 m/s 7/3: 13 m =.27ms, 731: .714 9/4: .43; 11/07/16: 0.74 m/s    Time  8    Period  Days    Status  On-going      PT LONG TERM GOAL #5   Title  Pt will decrease TUG to below 20 seconds in order to demonstrate decreased fall risk and improved LE strength       Baseline  04/19/16: 36.15 seconds; 05/24/16: 20sec; 06/16/16: 22.07 seconds; 11/07/16: unable to perform today due to weakness with transfers    Status  On-going      PT LONG TERM GOAL #6   Title  Patient will increase dynamic gait index score to >19/24 as to demonstrate reduced fall risk and improved dynamic gait balance for better safety with community/home ambulation.     Baseline  7/3: 14 8/7: 17/24; 11/07/16: 14/24 10/24: 14/24    Time  6    Period  Weeks    Status  On-going            Plan - 12/23/16 0947    Clinical Impression Statement  Patient has noted tendency to weight shift to left side during sit to stand transfers requiring verbal cueing to equal weight shift. Frequent breaks required for breathing. Static balance progressing with decreased LOB. Patient will continue to benefit from skilled physical therapy to improve strength and balance.     Rehab Potential  Good  Clinical Impairments Affecting Rehab  Potential  Positive: motivation; Negative: chronic balance dysfunction, chronic CVA    PT Frequency  1x / week    PT Duration  8 weeks    PT Treatment/Interventions  ADLs/Self Care Home Management;Cryotherapy;Moist Heat;Neuromuscular re-education;Gait training;Functional mobility training;Stair training;Therapeutic activities;Therapeutic exercise;Balance training;Manual techniques;Aquatic Therapy;Canalith Repostioning;Electrical Stimulation;Traction;Ultrasound;DME Instruction;Patient/family education;Passive range of motion;Energy conservation;Vestibular    PT Next Visit Plan  walk with cane    PT Home Exercise Plan  As prescribed    Consulted and Agree with Plan of Care  Patient       Patient will benefit from skilled therapeutic intervention in order to improve the following deficits and impairments:  Abnormal gait, Decreased activity tolerance, Decreased balance, Decreased strength, Difficulty walking, Obesity, Decreased coordination  Visit Diagnosis: Difficulty in walking, not elsewhere classified  Muscle weakness (generalized)  Gait difficulty     Problem List Patient Active Problem List   Diagnosis Date Noted  . Cough 03/20/2015  . Bronchitis, chronic obstructive w acute bronchitis ([place]) 03/20/2015  . Umbilical hernia without obstruction and without gangrene 01/12/2015  . Ventral hernia without obstruction or gangrene 01/02/2015   Janna Arch, PT, DPT   Janna Arch 12/23/2016, 10:01 AM  [place] MAIN [facility] SERVICES [address] [place], [place], [address] Phone: [phone]   Fax:  [phone]  Name: Rudolph Daoust. MRN: [id number] Date of Birth: 06-Apr-1940

## 2016-12-27 DIAGNOSIS — G4733 Obstructive sleep apnea (adult) (pediatric): Secondary | ICD-10-CM | POA: Diagnosis not present

## 2016-12-30 ENCOUNTER — Ambulatory Visit: Payer: PPO

## 2017-01-03 ENCOUNTER — Ambulatory Visit: Payer: PPO

## 2017-01-04 ENCOUNTER — Ambulatory Visit: Payer: PPO

## 2017-01-04 DIAGNOSIS — R262 Difficulty in walking, not elsewhere classified: Secondary | ICD-10-CM

## 2017-01-04 DIAGNOSIS — M6281 Muscle weakness (generalized): Secondary | ICD-10-CM

## 2017-01-04 DIAGNOSIS — R269 Unspecified abnormalities of gait and mobility: Secondary | ICD-10-CM

## 2017-01-04 NOTE — Therapy (Signed)
[place] MAIN [facility] SERVICES [address] [place], [place], [address] Phone: [phone]   Fax:  [phone]  Physical Therapy Treatment  Patient Details  Name: Daniel Eaton. MRN: [id number] Date of Birth: Jun 28, 1940 Referring Provider: Dr. Ouida Sills   Encounter Date: 01/04/2017  PT End of Session - 01/04/17 1607    Visit Number  46    Number of Visits  59    Date for PT Re-Evaluation  02/15/17    Authorization Type  7/10    PT Start Time  1445    PT Stop Time  1515    PT Time Calculation (min)  30 min    Equipment Utilized During Treatment  Gait belt min guard to S prn    Activity Tolerance  Patient tolerated treatment well    Behavior During Therapy  WFL for tasks assessed/performed       Past Medical History:  Diagnosis Date  . A-fib ([place])   . CHF (congestive heart failure) ([place])   . COPD (chronic obstructive pulmonary disease) ([place])   . Difficult intubation   . Hyperlipidemia   . Hypertension   . Hypothyroidism   . Kidney disease   . Sleep apnea   . Stroke ([place])   . Thyroid disease     Past Surgical History:  Procedure Laterality Date  . CARDIAC CATHETERIZATION    . CATARACT EXTRACTION W/ INTRAOCULAR LENS  IMPLANT, BILATERAL    . COLONOSCOPY  2008  . HERNIA REPAIR     bilateral inguinal hernia/ [facility]  . HERNIA REPAIR  02/02/2015   18 x 28 cm ventral light mesh placed laparoscopically.  . TONSILLECTOMY    . UMBILICAL HERNIA REPAIR N/A 02/02/2015   Procedure: HERNIA REPAIR UMBILICAL ADULT;  Surgeon: Robert Bellow, MD;  Location: [facility] ORS;  Service: General;  Laterality: N/A;  . VENTRAL HERNIA REPAIR N/A 02/02/2015   Procedure: LAPAROSCOPIC VENTRAL HERNIA;  Surgeon: Robert Bellow, MD;  Location: [facility] ORS;  Service: General;  Laterality: N/A;  . vp shunt placement  1979    There were no vitals filed for this visit.  Subjective Assessment - 01/04/17 1511    Subjective  Patient late to session due to  car pile up on way to hospital. Feeling steadier on feet and reports no LOB or falls since last session.     Pertinent History  Pt reports a history of rapid onset balance difficulty since 2014. He reports that the initial episode occurred due to improper use of diuretic with hypokalemia. One year later he had another episode with a fall. He was sent to ENT and had a VNG study which was normal. ENT also ordered a head CT and MRI. MRI showed chronic R MCA infarct. Pt comes for [employer] 2-3x/wk. He has completed pulmonary rehab at [facility] twice. Pt denies dizziness or vertigo but reports difficulty with his balance     Patient Stated Goals  Improve balance so that he only needs cane or no assistive device    Currently in Pain?  No/denies         [facility] PT Assessment - 01/04/17 0001      Berg Balance Test   Sit to Stand  Able to stand without using hands and stabilize independently    Standing Unsupported  Able to stand safely 2 minutes    Sitting with Back Unsupported but Feet Supported on Floor or Stool  Able to sit safely and securely 2 minutes  Stand to Sit  Sits safely with minimal use of hands    Transfers  Able to transfer safely, definite need of hands    Standing Unsupported with Eyes Closed  Able to stand 10 seconds safely    Standing Ubsupported with Feet Together  Able to place feet together independently and stand 1 minute safely    From Standing, Reach Forward with Outstretched Arm  Can reach forward >12 cm safely (5")    From Standing Position, Pick up Object from Floor  Able to pick up shoe safely and easily    From Standing Position, Turn to Look Behind Over each Shoulder  Looks behind from both sides and weight shifts well    Turn 360 Degrees  Able to turn 360 degrees safely but slowly    Standing Unsupported, Alternately Place Feet on Step/Stool  Able to stand independently and complete 8 steps >20 seconds    Standing Unsupported, One Foot in Front  Able to plae foot ahead of  the other independently and hold 30 seconds    Standing on One Leg  Able to lift leg independently and hold equal to or more than 3 seconds    Total Score  48      BERG 48/56  10MWT: 15 seconds without walker =.67 m/s  TUG 30 seconds without walker    Purse lipped breathing Sit to stand transfers with decreased UE assistance 5x from varying levels of height of chair. Require Min A from lowest level chair without UE supports.                      PT Education - 01/04/17 1607    Education provided  Yes    Education Details  POC, ambulatory mechanics    Person(s) Educated  Patient    Methods  Explanation;Demonstration;Verbal cues    Comprehension  Verbalized understanding;Returned demonstration       PT Short Term Goals - 01/04/17 1612      PT SHORT TERM GOAL #1   Title  Patient will ambulate 90 ft without cane and without stopping to allow for increase in mobility and quality of life.     Baseline  11/21: require standing rest break    Time  2    Period  Weeks    Status  New    Target Date  01/18/17        PT Long Term Goals - 01/04/17 1613      PT LONG TERM GOAL #1   Title  Pt will be independent with HEP in order to improve strength and balance in order to decrease fall risk and improve function at home and work.    Baseline  continues to improve    Time  8    Period  Weeks    Status  On-going      PT LONG TERM GOAL #2   Title  Pt will improve BERG to 48 points in order to demonstrate clinically significant improvement in balance.     Baseline  04/19/16: 38/56; 05/24/16: 48/18 06/16/16: 43/56 08/16/16: 47/56 7/31: 49/56; 11/07/16: 42/56 10/24: 45/56 11/21: 48/56    Time  8    Period  Weeks    Status  Achieved      PT LONG TERM GOAL #3   Title  Pt will decrease TUG to below 25 seconds in order to demonstrate decreased fall risk and improved LE strength       Baseline  04/19/16: 36.15 seconds; 05/24/16: 20sec; 06/16/16: 22.07 seconds;     Time  8    Period   Weeks    Status  Achieved      PT LONG TERM GOAL #4   Title  Pt will increase 10MWT by at least 0.13 m/s in order to demonstrate clinically significant improvement in community ambulation.     Baseline  04/19/16: 0.75 m/s; 05/24/2016 .833; 06/16/16: 11.4s = 0.88 m/s 7/3: 13 m =.85m/s, 731: .714 9/4: .43; 11/07/16: 0.74 m/s Jan 20, 2023: . 67 without AD    Time  8    Period  Days    Status  On-going      PT LONG TERM GOAL #5   Title  Pt will decrease TUG to below 20 seconds in order to demonstrate decreased fall risk and improved LE strength       Baseline  04/19/16: 36.15 seconds; 05/24/16: 20sec; 06/16/16: 22.07 seconds; 11/07/16: unable to perform today due to weakness with transfers 01-20-23: 30 seconds without AD    Status  On-going      PT LONG TERM GOAL #6   Title  Patient will increase dynamic gait index score to >19/24 as to demonstrate reduced fall risk and improved dynamic gait balance for better safety with community/home ambulation.     Baseline  7/3: 14 8/7: 17/24; 11/07/16: 14/24 10/24: 14/24    Time  6    Period  Weeks    Status  On-going            Plan - January 19, 2017 1612    Clinical Impression Statement  Patient progressing towards goals at this time. Due to short session from patient being late ambulatory testing was limited. Patient is improving in stable and dynamic balance. BERG 48/56. Ambulating without AD was performed and patient was able to perform speed trials for first session without AD demonstrating progress towards independent ambulation. Patient will continue to benefit from skilled physical therapy to improve strength and balance.     Rehab Potential  Good    Clinical Impairments Affecting Rehab Potential  Positive: motivation; Negative: chronic balance dysfunction, chronic CVA    PT Frequency  1x / week    PT Duration  6 weeks    PT Treatment/Interventions  ADLs/Self Care Home Management;Cryotherapy;Moist Heat;Neuromuscular re-education;Gait training;Functional mobility  training;Stair training;Therapeutic activities;Therapeutic exercise;Balance training;Manual techniques;Aquatic Therapy;Canalith Repostioning;Electrical Stimulation;Traction;Ultrasound;DME Instruction;Patient/family education;Passive range of motion;Energy conservation;Vestibular    PT Next Visit Plan  walk with cane    PT Home Exercise Plan  As prescribed    Consulted and Agree with Plan of Care  Patient       Patient will benefit from skilled therapeutic intervention in order to improve the following deficits and impairments:  Abnormal gait, Decreased activity tolerance, Decreased balance, Decreased strength, Difficulty walking, Obesity, Decreased coordination  Visit Diagnosis: Difficulty in walking, not elsewhere classified  Muscle weakness (generalized)  Gait difficulty   G-Codes - January 19, 2017 1615    Functional Assessment Tool Used (Outpatient Only)  BERG, 10MWT, DGI, TUG, clinical judgement    Functional Limitation  Mobility: Walking and moving around    Mobility: Walking and Moving Around Current Status [phone])  At least 20 percent but less than 40 percent impaired, limited or restricted    Mobility: Walking and Moving Around Goal Status [phone])  At least 1 percent but less than 20 percent impaired, limited or restricted       Problem List Patient Active Problem List   Diagnosis Date Noted  .  Cough 03/20/2015  . Bronchitis, chronic obstructive w acute bronchitis ([place]) 03/20/2015  . Umbilical hernia without obstruction and without gangrene 01/12/2015  . Ventral hernia without obstruction or gangrene 01/02/2015   Janna Arch, PT, DPT   Janna Arch 01/04/2017, 5:08 PM  [place] MAIN [facility] SERVICES [address] [place], [place], [address] Phone: [phone]   Fax:  [phone]  Name: Daniel Eaton. MRN: [id number] Date of Birth: [birth year]

## 2017-01-10 ENCOUNTER — Ambulatory Visit: Payer: PPO

## 2017-01-11 ENCOUNTER — Ambulatory Visit: Payer: PPO

## 2017-01-11 DIAGNOSIS — R262 Difficulty in walking, not elsewhere classified: Secondary | ICD-10-CM | POA: Diagnosis not present

## 2017-01-11 DIAGNOSIS — M6281 Muscle weakness (generalized): Secondary | ICD-10-CM

## 2017-01-11 NOTE — Therapy (Signed)
[place] MAIN [facility] SERVICES [address] [place], [place], [address] Phone: [phone]   Fax:  [phone]  Physical Therapy Treatment  Patient Details  Name: Daniel Eaton. MRN: [id number] Date of Birth: [birth year] Referring Provider: Dr. Ouida Sills   Encounter Date: 01/11/2017  PT End of Session - 01/11/17 1440    Visit Number  47    Number of Visits  59    Date for PT Re-Evaluation  02/15/17    Authorization Type  8/10    PT Start Time  1430    PT Stop Time  1515    PT Time Calculation (min)  45 min    Equipment Utilized During Treatment  Gait belt min guard to S prn    Activity Tolerance  Patient tolerated treatment well    Behavior During Therapy  WFL for tasks assessed/performed       Past Medical History:  Diagnosis Date  . A-fib ([place])   . CHF (congestive heart failure) ([place])   . COPD (chronic obstructive pulmonary disease) ([place])   . Difficult intubation   . Hyperlipidemia   . Hypertension   . Hypothyroidism   . Kidney disease   . Sleep apnea   . Stroke ([place])   . Thyroid disease     Past Surgical History:  Procedure Laterality Date  . CARDIAC CATHETERIZATION    . CATARACT EXTRACTION W/ INTRAOCULAR LENS  IMPLANT, BILATERAL    . COLONOSCOPY  2008  . HERNIA REPAIR     bilateral inguinal hernia/ [place]  . HERNIA REPAIR  02/02/2015   18 x 28 cm ventral light mesh placed laparoscopically.  . TONSILLECTOMY    . UMBILICAL HERNIA REPAIR N/A 02/02/2015   Procedure: HERNIA REPAIR UMBILICAL ADULT;  Surgeon: Robert Bellow, MD;  Location: [facility] ORS;  Service: General;  Laterality: N/A;  . VENTRAL HERNIA REPAIR N/A 02/02/2015   Procedure: LAPAROSCOPIC VENTRAL HERNIA;  Surgeon: Robert Bellow, MD;  Location: [facility] ORS;  Service: General;  Laterality: N/A;  . vp shunt placement  1979    There were no vitals filed for this visit.  Subjective Assessment - 01/11/17 1435    Subjective  Pt. went to [employer] to be a subject,  did a 6 min walk test for 160m, stood for 10 minutes without support. Did Nustep T5 on monday for 30 minutes. Fatigued and tired from increased movement this week    Pertinent History  Pt reports a history of rapid onset balance difficulty since 2014. He reports that the initial episode occurred due to improper use of diuretic with hypokalemia. One year later he had another episode with a fall. He was sent to ENT and had a VNG study which was normal. ENT also ordered a head CT and MRI. MRI showed chronic R MCA infarct. Pt comes for [employer] 2-3x/wk. He has completed pulmonary rehab at [facility] twice. Pt denies dizziness or vertigo but reports difficulty with his balance     Patient Stated Goals  Improve balance so that he only needs cane or no assistive device    Currently in Pain?  No/denies     // bars Standing marches in // bars 6x lengths of bars  6" step;  Toe taps single UE support 20x, SUE to no UE support   Side toe taps single UE support 15x  Purse lipped breathing   Seated with 5# ankle weights  Marching 2x 20x  LAQ 2x 10x each leg  Seated GTB  Resisted PF 2x10 each leg  Neuro Re-ed Airex pad: balloon pass 2x 30 passes Airex pad: eyes closed , started to posterior lean after 10 seconds Tandem line walk in // bars with SUE support     Pt. response to medical necessity: . Patient will continue to benefit from skilled physical therapy to improve strength and balance                          PT Education - 01/11/17 1440    Education provided  Yes    Education Details  functional strength and balance     Person(s) Educated  Patient    Methods  Explanation;Demonstration;Verbal cues    Comprehension  Verbalized understanding;Returned demonstration       PT Short Term Goals - 01/04/17 1612      PT SHORT TERM GOAL #1   Title  Patient will ambulate 90 ft without cane and without stopping to allow for increase in mobility and quality of life.      Baseline  11/21: require standing rest break    Time  2    Period  Weeks    Status  New    Target Date  01/18/17        PT Long Term Goals - 01/04/17 1613      PT LONG TERM GOAL #1   Title  Pt will be independent with HEP in order to improve strength and balance in order to decrease fall risk and improve function at home and work.    Baseline  continues to improve    Time  8    Period  Weeks    Status  On-going      PT LONG TERM GOAL #2   Title  Pt will improve BERG to 48 points in order to demonstrate clinically significant improvement in balance.     Baseline  04/19/16: 38/56; 05/24/16: 44/56 06/16/16: 43/56 08/16/16: 47/56 7/31: 49/56; 11/07/16: 42/56 10/24: 45/56 11/21: 48/56    Time  8    Period  Weeks    Status  Achieved      PT LONG TERM GOAL #3   Title  Pt will decrease TUG to below 25 seconds in order to demonstrate decreased fall risk and improved LE strength       Baseline  04/19/16: 36.15 seconds; 05/24/16: 20sec; 06/16/16: 22.07 seconds;     Time  8    Period  Weeks    Status  Achieved      PT LONG TERM GOAL #4   Title  Pt will increase 10MWT by at least 0.13 m/s in order to demonstrate clinically significant improvement in community ambulation.     Baseline  04/19/16: 0.75 m/s; 05/24/2016 .833; 06/16/16: 11.4s = 0.88 m/s 7/3: 13 m =.50m/s, 731: .714 9/4: .43; 11/07/16: 0.74 m/s 11/21: . 67 without AD    Time  8    Period  Days    Status  On-going      PT LONG TERM GOAL #5   Title  Pt will decrease TUG to below 20 seconds in order to demonstrate decreased fall risk and improved LE strength       Baseline  04/19/16: 36.15 seconds; 05/24/16: 20sec; 06/16/16: 22.07 seconds; 11/07/16: unable to perform today due to weakness with transfers 11/21: 30 seconds without AD    Status  On-going      PT LONG TERM GOAL #6  Title  Patient will increase dynamic gait index score to >19/24 as to demonstrate reduced fall risk and improved dynamic gait balance for better safety with community/home  ambulation.     Baseline  7/3: 14 8/7: 17/24; 11/07/16: 14/24 10/24: 14/24    Time  6    Period  Weeks    Status  On-going            Plan - 01/11/17 1500    Clinical Impression Statement  Patient's balance challenged by closed eyes, however is progressing to allow patient to be stable for 10 seconds prior to posterior lean/LOB. Increased ankle weights utilized in session demonstrating increased strength bilaterally. Patient will continue to benefit from skilled physical therapy to improve strength and balance    Rehab Potential  Good    Clinical Impairments Affecting Rehab Potential  Positive: motivation; Negative: chronic balance dysfunction, chronic CVA    PT Frequency  1x / week    PT Duration  6 weeks    PT Treatment/Interventions  ADLs/Self Care Home Management;Cryotherapy;Moist Heat;Neuromuscular re-education;Gait training;Functional mobility training;Stair training;Therapeutic activities;Therapeutic exercise;Balance training;Manual techniques;Aquatic Therapy;Canalith Repostioning;Electrical Stimulation;Traction;Ultrasound;DME Instruction;Patient/family education;Passive range of motion;Energy conservation;Vestibular    PT Next Visit Plan  walk with cane    PT Home Exercise Plan  As prescribed    Consulted and Agree with Plan of Care  Patient       Patient will benefit from skilled therapeutic intervention in order to improve the following deficits and impairments:  Abnormal gait, Decreased activity tolerance, Decreased balance, Decreased strength, Difficulty walking, Obesity, Decreased coordination  Visit Diagnosis: Difficulty in walking, not elsewhere classified  Muscle weakness (generalized)     Problem List Patient Active Problem List   Diagnosis Date Noted  . Cough 03/20/2015  . Bronchitis, chronic obstructive w acute bronchitis ([place]) 03/20/2015  . Umbilical hernia without obstruction and without gangrene 01/12/2015  . Ventral hernia without obstruction or  gangrene 01/02/2015  Janna Arch, PT, DPT    Janna Arch 01/11/2017, 3:15 PM  [place] MAIN [facility] SERVICES [address] [place], [place], [address] Phone: [phone]   Fax:  [phone]  Name: Daniel Eaton. MRN: [id number] Date of Birth: 1940/05/29

## 2017-01-16 ENCOUNTER — Ambulatory Visit: Payer: PPO | Attending: Internal Medicine

## 2017-01-16 DIAGNOSIS — R262 Difficulty in walking, not elsewhere classified: Secondary | ICD-10-CM | POA: Insufficient documentation

## 2017-01-16 DIAGNOSIS — M6281 Muscle weakness (generalized): Secondary | ICD-10-CM

## 2017-01-16 DIAGNOSIS — R269 Unspecified abnormalities of gait and mobility: Secondary | ICD-10-CM | POA: Diagnosis not present

## 2017-01-16 NOTE — Therapy (Signed)
[place] MAIN [facility] SERVICES [address] [place], [place], [address] Phone: [phone]   Fax:  [phone]  Physical Therapy Treatment  Patient Details  Name: Daniel Eaton. MRN: [id number] Date of Birth: 1940-12-28 Referring Provider: Dr. Ouida Sills   Encounter Date: 01/16/2017  PT End of Session - 01/16/17 1535    Visit Number  48    Number of Visits  59    Date for PT Re-Evaluation  02/15/17    Authorization Type  9/10    PT Start Time  1524    PT Stop Time  1600    PT Time Calculation (min)  36 min    Equipment Utilized During Treatment  Gait belt min guard to S prn    Activity Tolerance  Patient tolerated treatment well    Behavior During Therapy  WFL for tasks assessed/performed       Past Medical History:  Diagnosis Date  . A-fib ([employer])   . CHF (congestive heart failure) ([place])   . COPD (chronic obstructive pulmonary disease) ([place])   . Difficult intubation   . Hyperlipidemia   . Hypertension   . Hypothyroidism   . Kidney disease   . Sleep apnea   . Stroke ([place])   . Thyroid disease     Past Surgical History:  Procedure Laterality Date  . CARDIAC CATHETERIZATION    . CATARACT EXTRACTION W/ INTRAOCULAR LENS  IMPLANT, BILATERAL    . COLONOSCOPY  2008  . HERNIA REPAIR     bilateral inguinal hernia/ [place]  . HERNIA REPAIR  02/02/2015   18 x 28 cm ventral light mesh placed laparoscopically.  . TONSILLECTOMY    . UMBILICAL HERNIA REPAIR N/A 02/02/2015   Procedure: HERNIA REPAIR UMBILICAL ADULT;  Surgeon: Robert Bellow, MD;  Location: [facility] ORS;  Service: General;  Laterality: N/A;  . VENTRAL HERNIA REPAIR N/A 02/02/2015   Procedure: LAPAROSCOPIC VENTRAL HERNIA;  Surgeon: Robert Bellow, MD;  Location: [facility] ORS;  Service: General;  Laterality: N/A;  . vp shunt placement  1979    There were no vitals filed for this visit.     Gait Subjective Assessment - 01/16/17 1528    Subjective  Patient rushed prior to  session, was late and forgot water bottle in car. Reports compliance with HEP, will go back to [facility] tomorrow.     Pertinent History  Pt reports a history of rapid onset balance difficulty since 2014. He reports that the initial episode occurred due to improper use of diuretic with hypokalemia. One year later he had another episode with a fall. He was sent to ENT and had a VNG study which was normal. ENT also ordered a head CT and MRI. MRI showed chronic R MCA infarct. Pt comes for [employer] 2-3x/wk. He has completed pulmonary rehab at [facility] twice. Pt denies dizziness or vertigo but reports difficulty with his balance     Patient Stated Goals  Improve balance so that he only needs cane or no assistive device    Currently in Pain?  No/denies     Ambulate 96 ft with SPC, cues for upright posture, pt. Reports med/hard, cues for increasing step length and utilizing shoulders back positioning to decrease low back pain.  Performed two times.    Seated scapular retractions 1 minute between walking laps in gym.    seated marching with GTB around knees 20x cues for upright posture and core activation    Seated abduction with  GTB around knees 20x  with cues for upright posture and core activation, subsequent/consequent scapular retractions.   Resisted knee flexion GTB 15x each side Seated knee extension GTB around ankles 10x each leg  Resisted pf 15x GTB each side,  Standing heel raises 15x with BUE support   Pt. response to medical necessity: . Patient will continue to benefit from skilled physical therapy to improve strength and balance                        PT Education - 01/16/17 1534    Education provided  Yes    Education Details  functional ambulation     Person(s) Educated  Patient    Methods  Explanation;Demonstration;Verbal cues    Comprehension  Verbalized understanding;Returned demonstration       PT Short Term Goals - 01/04/17 1612      PT SHORT TERM GOAL  #1   Title  Patient will ambulate 90 ft without cane and without stopping to allow for increase in mobility and quality of life.     Baseline  11/21: require standing rest break    Time  2    Period  Weeks    Status  New    Target Date  01/18/17        PT Long Term Goals - 01/04/17 1613      PT LONG TERM GOAL #1   Title  Pt will be independent with HEP in order to improve strength and balance in order to decrease fall risk and improve function at home and work.    Baseline  continues to improve    Time  8    Period  Weeks    Status  On-going      PT LONG TERM GOAL #2   Title  Pt will improve BERG to 48 points in order to demonstrate clinically significant improvement in balance.     Baseline  04/19/16: 38/56; 05/24/16: 44/56 06/16/16: 43/56 08/16/16: 47/56 7/31: 49/56; 11/07/16: 42/56 10/24: 45/56 11/21: 48/56    Time  8    Period  Weeks    Status  Achieved      PT LONG TERM GOAL #3   Title  Pt will decrease TUG to below 25 seconds in order to demonstrate decreased fall risk and improved LE strength       Baseline  04/19/16: 36.15 seconds; 05/24/16: 20sec; 06/16/16: 22.07 seconds;     Time  8    Period  Weeks    Status  Achieved      PT LONG TERM GOAL #4   Title  Pt will increase 10MWT by at least 0.13 m/s in order to demonstrate clinically significant improvement in community ambulation.     Baseline  04/19/16: 0.75 m/s; 05/24/2016 .833; 06/16/16: 11.4s = 0.88 m/s 7/3: 13 m =.14m/s, 731: .714 9/4: .43; 11/07/16: 0.74 m/s 11/21: . 67 without AD    Time  8    Period  Days    Status  On-going      PT LONG TERM GOAL #5   Title  Pt will decrease TUG to below 20 seconds in order to demonstrate decreased fall risk and improved LE strength       Baseline  04/19/16: 36.15 seconds; 05/24/16: 20sec; 06/16/16: 22.07 seconds; 11/07/16: unable to perform today due to weakness with transfers 11/21: 30 seconds without AD    Status  On-going      PT LONG TERM  GOAL #6   Title  Patient will increase dynamic gait  index score to >19/24 as to demonstrate reduced fall risk and improved dynamic gait balance for better safety with community/home ambulation.     Baseline  7/3: 14 8/7: 17/24; 11/07/16: 14/24 10/24: 14/24    Time  6    Period  Weeks    Status  On-going            Plan - 01/16/17 1551    Clinical Impression Statement  Patient progressing with functional ambulation with SPC with decreased trunk flexion and improved height of step. Patient performed seated strengthening activities with good coordination and control of eccentric and concentric contractions. Patient will continue to benefit from skilled physical therapy to improve strength and balance    Rehab Potential  Good    Clinical Impairments Affecting Rehab Potential  Positive: motivation; Negative: chronic balance dysfunction, chronic CVA    PT Frequency  1x / week    PT Duration  6 weeks    PT Treatment/Interventions  ADLs/Self Care Home Management;Cryotherapy;Moist Heat;Neuromuscular re-education;Gait training;Functional mobility training;Stair training;Therapeutic activities;Therapeutic exercise;Balance training;Manual techniques;Aquatic Therapy;Canalith Repostioning;Electrical Stimulation;Traction;Ultrasound;DME Instruction;Patient/family education;Passive range of motion;Energy conservation;Vestibular    PT Next Visit Plan  walk with cane    PT Home Exercise Plan  As prescribed    Consulted and Agree with Plan of Care  Patient       Patient will benefit from skilled therapeutic intervention in order to improve the following deficits and impairments:  Abnormal gait, Decreased activity tolerance, Decreased balance, Decreased strength, Difficulty walking, Obesity, Decreased coordination  Visit Diagnosis: Difficulty in walking, not elsewhere classified  Muscle weakness (generalized)  Gait difficulty     Problem List Patient Active Problem List   Diagnosis Date Noted  . Cough 03/20/2015  . Bronchitis, chronic obstructive  w acute bronchitis ([place]) 03/20/2015  . Umbilical hernia without obstruction and without gangrene 01/12/2015  . Ventral hernia without obstruction or gangrene 01/02/2015   Janna Arch, PT, DPT   Janna Arch 01/16/2017, 4:00 PM  [place] MAIN [facility] SERVICES [address] [place], [place], [address] Phone: [phone]   Fax:  [phone]  Name: Daniel Eaton. MRN: [id number] Date of Birth: 1940-03-09

## 2017-01-17 ENCOUNTER — Ambulatory Visit: Payer: PPO

## 2017-01-25 ENCOUNTER — Ambulatory Visit: Payer: PPO

## 2017-01-26 DIAGNOSIS — G4733 Obstructive sleep apnea (adult) (pediatric): Secondary | ICD-10-CM | POA: Diagnosis not present

## 2017-02-01 ENCOUNTER — Ambulatory Visit: Payer: PPO

## 2017-02-01 DIAGNOSIS — R262 Difficulty in walking, not elsewhere classified: Secondary | ICD-10-CM | POA: Diagnosis not present

## 2017-02-01 DIAGNOSIS — M6281 Muscle weakness (generalized): Secondary | ICD-10-CM

## 2017-02-01 NOTE — Therapy (Signed)
[place] MAIN [facility] SERVICES [address] [place], [place], [address] Phone: [phone]   Fax:  [phone]  Physical Therapy Treatment  Patient Details  Name: Daniel Eaton. MRN: [id number] Date of Birth: [birth year] Referring Provider: Dr. Ouida Sills   Encounter Date: 02/01/2017    Past Medical History:  Diagnosis Date  . A-fib ([place])   . CHF (congestive heart failure) ([place])   . COPD (chronic obstructive pulmonary disease) ([place])   . Difficult intubation   . Hyperlipidemia   . Hypertension   . Hypothyroidism   . Kidney disease   . Sleep apnea   . Stroke ([place])   . Thyroid disease     Past Surgical History:  Procedure Laterality Date  . CARDIAC CATHETERIZATION    . CATARACT EXTRACTION W/ INTRAOCULAR LENS  IMPLANT, BILATERAL    . COLONOSCOPY  2008  . HERNIA REPAIR     bilateral inguinal hernia/ [place]  . HERNIA REPAIR  02/02/2015   18 x 28 cm ventral light mesh placed laparoscopically.  . TONSILLECTOMY    . UMBILICAL HERNIA REPAIR N/A 02/02/2015   Procedure: HERNIA REPAIR UMBILICAL ADULT;  Surgeon: Robert Bellow, MD;  Location: [facility] ORS;  Service: General;  Laterality: N/A;  . VENTRAL HERNIA REPAIR N/A 02/02/2015   Procedure: LAPAROSCOPIC VENTRAL HERNIA;  Surgeon: Robert Bellow, MD;  Location: [facility] ORS;  Service: General;  Laterality: N/A;  . vp shunt placement  1979    There were no vitals filed for this visit.  seated marches 20x   Ambulate 96 ft with SPC, needed three standing rest breaks for breathing and fatigue. x2  Scapular retractions seated 15x  Seated rockerboard 10pf, 10x df, 10x combined    resisted knee flexion OTB 15x (BLE)   seated abduction OTB around ankles 15x each leg   Seated knee extensions OTB around ankles 15x each leg.                        PT Short Term Goals - 01/04/17 1612      PT SHORT TERM GOAL #1   Title  Patient will ambulate 90 ft without cane and  without stopping to allow for increase in mobility and quality of life.     Baseline  11/21: require standing rest break    Time  2    Period  Weeks    Status  New    Target Date  01/18/17        PT Long Term Goals - 01/04/17 1613      PT LONG TERM GOAL #1   Title  Pt will be independent with HEP in order to improve strength and balance in order to decrease fall risk and improve function at home and work.    Baseline  continues to improve    Time  8    Period  Weeks    Status  On-going      PT LONG TERM GOAL #2   Title  Pt will improve BERG to 48 points in order to demonstrate clinically significant improvement in balance.     Baseline  04/19/16: 38/56; 05/24/16: 44/56 06/16/16: 43/56 08/16/16: 47/56 7/31: 49/56; 11/07/16: 42/56 10/24: 45/56 11/21: 48/56    Time  8    Period  Weeks    Status  Achieved      PT LONG TERM GOAL #3   Title  Pt will decrease TUG to below 25 seconds  in order to demonstrate decreased fall risk and improved LE strength       Baseline  04/19/16: 36.15 seconds; 05/24/16: 20sec; 06/16/16: 22.07 seconds;     Time  8    Period  Weeks    Status  Achieved      PT LONG TERM GOAL #4   Title  Pt will increase 10MWT by at least 0.13 m/s in order to demonstrate clinically significant improvement in community ambulation.     Baseline  04/19/16: 0.75 m/s; 05/24/2016 .833; 06/16/16: 11.4s = 0.88 m/s 7/3: 13 m =.63m/s, 731: .714 9/4: .43; 11/07/16: 0.74 m/s 11/21: . 67 without AD    Time  8    Period  Days    Status  On-going      PT LONG TERM GOAL #5   Title  Pt will decrease TUG to below 20 seconds in order to demonstrate decreased fall risk and improved LE strength       Baseline  04/19/16: 36.15 seconds; 05/24/16: 20sec; 06/16/16: 22.07 seconds; 11/07/16: unable to perform today due to weakness with transfers 11/21: 30 seconds without AD    Status  On-going      PT LONG TERM GOAL #6   Title  Patient will increase dynamic gait index score to >19/24 as to demonstrate reduced fall risk  and improved dynamic gait balance for better safety with community/home ambulation.     Baseline  7/3: 14 8/7: 17/24; 11/07/16: 14/24 10/24: 14/24    Time  6    Period  Weeks    Status  On-going              Patient will benefit from skilled therapeutic intervention in order to improve the following deficits and impairments:     Visit Diagnosis: No diagnosis found.     Problem List Patient Active Problem List   Diagnosis Date Noted  . Cough 03/20/2015  . Bronchitis, chronic obstructive w acute bronchitis ([place]) 03/20/2015  . Umbilical hernia without obstruction and without gangrene 01/12/2015  . Ventral hernia without obstruction or gangrene 01/02/2015   Janna Arch, PT, DPT   Janna Arch 02/01/2017, 11:12 AM  [place] MAIN [facility] SERVICES [address] [place], [place], [address] Phone: [phone]   Fax:  [phone]  Name: Daniel Eaton. MRN: [id number] Date of Birth: January 28, 1941

## 2017-02-09 ENCOUNTER — Ambulatory Visit: Payer: PPO

## 2017-02-09 DIAGNOSIS — G4733 Obstructive sleep apnea (adult) (pediatric): Secondary | ICD-10-CM | POA: Diagnosis not present

## 2017-02-09 DIAGNOSIS — R269 Unspecified abnormalities of gait and mobility: Secondary | ICD-10-CM

## 2017-02-09 DIAGNOSIS — R262 Difficulty in walking, not elsewhere classified: Secondary | ICD-10-CM

## 2017-02-09 DIAGNOSIS — M6281 Muscle weakness (generalized): Secondary | ICD-10-CM

## 2017-02-09 NOTE — Therapy (Signed)
[place] MAIN [facility] SERVICES [address] [place], [place], [address] Phone: [phone]   Fax:  [phone]  Physical Therapy Treatment  Patient Details  Name: Daniel Eaton. MRN: [id number] Date of Birth: [birth year] Referring Provider: Dr. Ouida Sills   Encounter Date: 02/09/2017  PT End of Session - 02/09/17 1159    Visit Number  50    Number of Visits  64    Date for PT Re-Evaluation  03/29/17    Authorization Type  1/10    PT Start Time  1100    PT Stop Time  1145    PT Time Calculation (min)  45 min    Equipment Utilized During Treatment  Gait belt min guard to S prn    Activity Tolerance  Patient tolerated treatment well    Behavior During Therapy  WFL for tasks assessed/performed       Past Medical History:  Diagnosis Date  . A-fib ([place])   . CHF (congestive heart failure) ([place])   . COPD (chronic obstructive pulmonary disease) ([place])   . Difficult intubation   . Hyperlipidemia   . Hypertension   . Hypothyroidism   . Kidney disease   . Sleep apnea   . Stroke ([place])   . Thyroid disease     Past Surgical History:  Procedure Laterality Date  . CARDIAC CATHETERIZATION    . CATARACT EXTRACTION W/ INTRAOCULAR LENS  IMPLANT, BILATERAL    . COLONOSCOPY  2008  . HERNIA REPAIR     bilateral inguinal hernia/ [place]  . HERNIA REPAIR  02/02/2015   18 x 28 cm ventral light mesh placed laparoscopically.  . TONSILLECTOMY    . UMBILICAL HERNIA REPAIR N/A 02/02/2015   Procedure: HERNIA REPAIR UMBILICAL ADULT;  Surgeon: Robert Bellow, MD;  Location: [facility] ORS;  Service: General;  Laterality: N/A;  . VENTRAL HERNIA REPAIR N/A 02/02/2015   Procedure: LAPAROSCOPIC VENTRAL HERNIA;  Surgeon: Robert Bellow, MD;  Location: [facility] ORS;  Service: General;  Laterality: N/A;  . vp shunt placement  1979    There were no vitals filed for this visit.  Subjective Assessment - 02/09/17 1157    Subjective  Patient has not left house since  last session due to holidays and weather. Reports sitting too much and not having much of a chance to do his exercises due to feeling down.     Pertinent History  Pt reports a history of rapid onset balance difficulty since 2014. He reports that the initial episode occurred due to improper use of diuretic with hypokalemia. One year later he had another episode with a fall. He was sent to ENT and had a VNG study which was normal. ENT also ordered a head CT and MRI. MRI showed chronic R MCA infarct. Pt comes for [employer] 2-3x/wk. He has completed pulmonary rehab at [facility] twice. Pt denies dizziness or vertigo but reports difficulty with his balance     Patient Stated Goals  Improve balance so that he only needs cane or no assistive device    Currently in Pain?  No/denies          [facility] PT Assessment - 02/09/17 0001      Dynamic Gait Index   Level Surface  Mild Impairment    Change in Gait Speed  Mild Impairment    Gait with Horizontal Head Turns  Mild Impairment    Gait with Vertical Head Turns  Mild Impairment    Gait  and Pivot Turn  Mild Impairment    Step Over Obstacle  Moderate Impairment    Step Around Obstacles  Mild Impairment    Steps  Moderate Impairment    Total Score  14      10MWT= .69 m/s  TUG: Patient unable to stand up from low chair, required Min A to obtaining standing position today.  DGI: 14   stairs: step to pattern BUE support. Lead with L going up , R going down.    ambulate 94 feet with SPC and no breaks for breathing.   Oxygen monitored throughout session  10x STS from raised  plinth   LAQ 15x with 5 second holds   Seated marches hold 3 seconds int38o hip flexion 20x   Pt. response to medical necessity:  Patient will continue to benefit from skilled physical therapy to improve strength and balance.                      PT Education - 02/09/17 1158    Education provided  Yes    Education Details  exercise compliance HEP compliance     Person(s) Educated  Patient    Methods  Explanation    Comprehension  Verbalized understanding       PT Short Term Goals - 02/09/17 1138      PT SHORT TERM GOAL #1   Title  Patient will ambulate 90 ft without cane and without stopping to allow for increase in mobility and quality of life.     Baseline  12/27: ambulate 90 ft with cane and no rest break     Time  2    Period  Weeks    Status  Partially Met        PT Long Term Goals - 02/09/17 1123      PT LONG TERM GOAL #1   Title  Pt will be independent with HEP in order to improve strength and balance in order to decrease fall risk and improve function at home and work.    Baseline  continues to improve    Time  8    Period  Weeks    Status  On-going      PT LONG TERM GOAL #2   Title  Pt will improve BERG to 48 points in order to demonstrate clinically significant improvement in balance.     Baseline  04/19/16: 38/56; 05/24/16: 44/56 06/16/16: 43/56 08/16/16: 47/56 7/31: 49/56; 11/07/16: 42/56 10/24: 45/56 11/21: 48/56    Time  8    Period  Weeks    Status  Achieved      PT LONG TERM GOAL #3   Title  Pt will decrease TUG to below 25 seconds in order to demonstrate decreased fall risk and improved LE strength       Baseline  04/19/16: 36.15 seconds; 05/24/16: 20sec; 06/16/16: 22.07 seconds;     Time  8    Period  Weeks    Status  Achieved      PT LONG TERM GOAL #4   Title  Pt will increase 10MWT by at least 0.13 m/s in order to demonstrate clinically significant improvement in community ambulation.     Baseline  04/19/16: 0.75 m/s; 05/24/2016 .833; 06/16/16: 11.4s = 0.88 m/s 7/3: 13 m =.59ms, 731: .714 9/4: .43; 11/07/16: 0.74 m/s 11/21: . 67 without AD 12/27: .69 m/s     Time  8    Period  Days  Status  On-going      PT LONG TERM GOAL #5   Title  Pt will decrease TUG to below 20 seconds in order to demonstrate decreased fall risk and improved LE strength       Baseline  04/19/16: 36.15 seconds; 05/24/16: 20sec; 06/16/16: 22.07  seconds; 11/07/16: unable to perform today due to weakness with transfers 11/21: 30 seconds without AD    Status  On-going      PT LONG TERM GOAL #6   Title  Patient will increase dynamic gait index score to >19/24 as to demonstrate reduced fall risk and improved dynamic gait balance for better safety with community/home ambulation.     Baseline  7/3: 14 8/7: 17/24; 11/07/16: 14/24 10/24: 14/24 02-18-23: 14/24    Time  6    Period  Weeks    Status  On-going            Plan - 02-17-2017 1225    Clinical Impression Statement  Patient presents with excessive fatigue due to recent weather and holiday's limiting ability to be outdoors and increasing time seated. Patient ambulated 79 ft with SPC and no breaks for first time.DGI =14, 10MWT=.69. Patient presents with overall decreased LE strength and poor capacity for functional mobility at this time, these deficits are improved however than previously, and will continue to improve with additional therapy. Patient will continue to benefit from skilled physical therapy to improve strength and balance.    Rehab Potential  Good    Clinical Impairments Affecting Rehab Potential  Positive: motivation; Negative: chronic balance dysfunction, chronic CVA    PT Frequency  1x / week    PT Duration  6 weeks    PT Treatment/Interventions  ADLs/Self Care Home Management;Cryotherapy;Moist Heat;Neuromuscular re-education;Gait training;Functional mobility training;Stair training;Therapeutic activities;Therapeutic exercise;Balance training;Manual techniques;Aquatic Therapy;Canalith Repostioning;Electrical Stimulation;Traction;Ultrasound;DME Instruction;Patient/family education;Passive range of motion;Energy conservation;Vestibular    PT Next Visit Plan  walk with cane    PT Home Exercise Plan  As prescribed    Consulted and Agree with Plan of Care  Patient       Patient will benefit from skilled therapeutic intervention in order to improve the following deficits and  impairments:  Abnormal gait, Decreased activity tolerance, Decreased balance, Decreased strength, Difficulty walking, Obesity, Decreased coordination  Visit Diagnosis: Difficulty in walking, not elsewhere classified  Muscle weakness (generalized)  Gait difficulty   G-Codes - 2017-02-17 1227    Functional Assessment Tool Used (Outpatient Only)  gait mechanics,  10MWT, DGI, , clinical judgement    Functional Limitation  Mobility: Walking and moving around    Mobility: Walking and Moving Around Current Status [phone]  At least 20 percent but less than 40 percent impaired, limited or restricted    Mobility: Walking and Moving Around Goal Status [phone])  At least 1 percent but less than 20 percent impaired, limited or restricted       Problem List Patient Active Problem List   Diagnosis Date Noted  . Cough 03/20/2015  . Bronchitis, chronic obstructive w acute bronchitis ([place]) 03/20/2015  . Umbilical hernia without obstruction and without gangrene 01/12/2015  . Ventral hernia without obstruction or gangrene 01/02/2015   Janna Arch, PT, DPT   Janna Arch February 17, 2017, 12:29 PM  [place] MAIN [facility] SERVICES [address] [place], [place], [address] Phone: [phone]   Fax:  [phone]  Name: Daniel Eaton. MRN: [id number] Date of Birth: [birth year]

## 2017-02-13 ENCOUNTER — Ambulatory Visit: Payer: PPO

## 2017-02-20 ENCOUNTER — Ambulatory Visit: Payer: PPO | Attending: Internal Medicine

## 2017-02-20 DIAGNOSIS — M6281 Muscle weakness (generalized): Secondary | ICD-10-CM | POA: Diagnosis not present

## 2017-02-20 DIAGNOSIS — R269 Unspecified abnormalities of gait and mobility: Secondary | ICD-10-CM | POA: Insufficient documentation

## 2017-02-20 DIAGNOSIS — R262 Difficulty in walking, not elsewhere classified: Secondary | ICD-10-CM | POA: Insufficient documentation

## 2017-02-20 NOTE — Therapy (Signed)
[place] MAIN [facility] SERVICES [address] [place], [place], [address] Phone: [phone]   Fax:  [phone]  Physical Therapy Treatment  Patient Details  Name: Daniel Eaton. MRN: [id number] Date of Birth: 06/17/1940 Referring Provider: Dr. Ouida Sills   Encounter Date: 02/20/2017  PT End of Session - 02/20/17 1202    Visit Number  51    Number of Visits  64    Date for PT Re-Evaluation  03/29/17    Authorization Type  2/10    PT Start Time  1033    PT Stop Time  1115    PT Time Calculation (min)  42 min    Equipment Utilized During Treatment  Gait belt min guard to S prn    Activity Tolerance  Patient tolerated treatment well    Behavior During Therapy  WFL for tasks assessed/performed       Past Medical History:  Diagnosis Date  . A-fib ([place])   . CHF (congestive heart failure) ([place])   . COPD (chronic obstructive pulmonary disease) ([place])   . Difficult intubation   . Hyperlipidemia   . Hypertension   . Hypothyroidism   . Kidney disease   . Sleep apnea   . Stroke ([place])   . Thyroid disease     Past Surgical History:  Procedure Laterality Date  . CARDIAC CATHETERIZATION    . CATARACT EXTRACTION W/ INTRAOCULAR LENS  IMPLANT, BILATERAL    . COLONOSCOPY  2008  . HERNIA REPAIR     bilateral inguinal hernia/ [place]  . HERNIA REPAIR  02/02/2015   18 x 28 cm ventral light mesh placed laparoscopically.  . TONSILLECTOMY    . UMBILICAL HERNIA REPAIR N/A 02/02/2015   Procedure: HERNIA REPAIR UMBILICAL ADULT;  Surgeon: Robert Bellow, MD;  Location: [facility] ORS;  Service: General;  Laterality: N/A;  . VENTRAL HERNIA REPAIR N/A 02/02/2015   Procedure: LAPAROSCOPIC VENTRAL HERNIA;  Surgeon: Robert Bellow, MD;  Location: [facility] ORS;  Service: General;  Laterality: N/A;  . vp shunt placement  1979    There were no vitals filed for this visit.  Subjective Assessment - 02/20/17 1041    Subjective  Patient ran out of medication the  week prior and scraped leg causing excessive bleeding. Has it wrapped up, got medicine yesterday and now has it back in his system. Reports not doing HEP as frequently due to car problems. Will return to [facility] tomorrow.     Pertinent History  Pt reports a history of rapid onset balance difficulty since 2014. He reports that the initial episode occurred due to improper use of diuretic with hypokalemia. One year later he had another episode with a fall. He was sent to ENT and had a VNG study which was normal. ENT also ordered a head CT and MRI. MRI showed chronic R MCA infarct. Pt comes for [employer] 2-3x/wk. He has completed pulmonary rehab at [facility] twice. Pt denies dizziness or vertigo but reports difficulty with his balance     Patient Stated Goals  Improve balance so that he only needs cane or no assistive device    Currently in Pain?  No/denies         Ambulate 96 ft with SPC, needed three standing rest breaks for breathing and fatigue. x1   Scapular retractions seated  With purse lipped breathing 15x  resisted knee flexion RTB 15x (BLE)   seated abduction RTB around ankles 15x each leg  Seated knee extensions RTB around ankles 15x each leg.   Seated adduction with ball 2x 12   Seated adduction with knee extension squeezing ball between ankles 15x   Resisted pf seated RTB 15x  Resisted df seated RTB 15x  Resisted marching RTB 15x seated      Pt. response to medical necessity: . Patient will continue to benefit from skilled physical therapy to improve strength and balance.                    PT Education - 02/20/17 1201    Education provided  Yes    Education Details  HEP compliance, ambulating with cane.     Person(s) Educated  Patient    Methods  Explanation;Demonstration;Verbal cues    Comprehension  Verbalized understanding;Returned demonstration       PT Short Term Goals - 02/09/17 1138      PT SHORT TERM GOAL #1   Title  Patient will  ambulate 90 ft without cane and without stopping to allow for increase in mobility and quality of life.     Baseline  12/27: ambulate 90 ft with cane and no rest break     Time  2    Period  Weeks    Status  Partially Met        PT Long Term Goals - 02/09/17 1123      PT LONG TERM GOAL #1   Title  Pt will be independent with HEP in order to improve strength and balance in order to decrease fall risk and improve function at home and work.    Baseline  continues to improve    Time  8    Period  Weeks    Status  On-going      PT LONG TERM GOAL #2   Title  Pt will improve BERG to 48 points in order to demonstrate clinically significant improvement in balance.     Baseline  04/19/16: 38/56; 05/24/16: 44/56 06/16/16: 43/56 08/16/16: 47/56 7/31: 49/56; 11/07/16: 42/56 10/24: 45/56 11/21: 48/56    Time  8    Period  Weeks    Status  Achieved      PT LONG TERM GOAL #3   Title  Pt will decrease TUG to below 25 seconds in order to demonstrate decreased fall risk and improved LE strength       Baseline  04/19/16: 36.15 seconds; 05/24/16: 20sec; 06/16/16: 22.07 seconds;     Time  8    Period  Weeks    Status  Achieved      PT LONG TERM GOAL #4   Title  Pt will increase 10MWT by at least 0.13 m/s in order to demonstrate clinically significant improvement in community ambulation.     Baseline  04/19/16: 0.75 m/s; 05/24/2016 .833; 06/16/16: 11.4s = 0.88 m/s 7/3: 13 m =.31ms, 731: .714 9/4: .43; 11/07/16: 0.74 m/s 11/21: . 67 without AD 12/27: .69 m/s     Time  8    Period  Days    Status  On-going      PT LONG TERM GOAL #5   Title  Pt will decrease TUG to below 20 seconds in order to demonstrate decreased fall risk and improved LE strength       Baseline  04/19/16: 36.15 seconds; 05/24/16: 20sec; 06/16/16: 22.07 seconds; 11/07/16: unable to perform today due to weakness with transfers 11/21: 30 seconds without AD    Status  On-going  PT LONG TERM GOAL #6   Title  Patient will increase dynamic gait index  score to >19/24 as to demonstrate reduced fall risk and improved dynamic gait balance for better safety with community/home ambulation.     Baseline  7/3: 14 8/7: 17/24; 11/07/16: 14/24 10/24: 14/24 12/27: 14/24    Time  6    Period  Weeks    Status  On-going            Plan - 02/20/17 1206    Clinical Impression Statement  Patient ambulation limited by breathing due to recent cold. Seated interventions performed with patient demonstrating increased weakness of LLE over RLE. Patient noted decrease in mobility due to breathing at this time. Patient will continue to benefit from skilled physical therapy to improve strength and balance.     Rehab Potential  Good    Clinical Impairments Affecting Rehab Potential  Positive: motivation; Negative: chronic balance dysfunction, chronic CVA    PT Frequency  1x / week    PT Duration  6 weeks    PT Treatment/Interventions  ADLs/Self Care Home Management;Cryotherapy;Moist Heat;Neuromuscular re-education;Gait training;Functional mobility training;Stair training;Therapeutic activities;Therapeutic exercise;Balance training;Manual techniques;Aquatic Therapy;Canalith Repostioning;Electrical Stimulation;Traction;Ultrasound;DME Instruction;Patient/family education;Passive range of motion;Energy conservation;Vestibular    PT Next Visit Plan  walk with cane    PT Home Exercise Plan  As prescribed    Consulted and Agree with Plan of Care  Patient       Patient will benefit from skilled therapeutic intervention in order to improve the following deficits and impairments:  Abnormal gait, Decreased activity tolerance, Decreased balance, Decreased strength, Difficulty walking, Obesity, Decreased coordination  Visit Diagnosis: Difficulty in walking, not elsewhere classified  Muscle weakness (generalized)  Gait difficulty     Problem List Patient Active Problem List   Diagnosis Date Noted  . Cough 03/20/2015  . Bronchitis, chronic obstructive w acute  bronchitis ([place]) 03/20/2015  . Umbilical hernia without obstruction and without gangrene 01/12/2015  . Ventral hernia without obstruction or gangrene 01/02/2015   Janna Arch, PT, DPT   Janna Arch 02/20/2017, 12:09 PM  [place] MAIN [facility] SERVICES [address] [place], [place], [address] Phone: [phone]   Fax:  [phone]  Name: Lynkin Saini. MRN: [id number] Date of Birth: 1940-07-20

## 2017-02-23 DIAGNOSIS — B351 Tinea unguium: Secondary | ICD-10-CM | POA: Diagnosis not present

## 2017-02-23 DIAGNOSIS — M79675 Pain in left toe(s): Secondary | ICD-10-CM | POA: Diagnosis not present

## 2017-02-23 DIAGNOSIS — M79674 Pain in right toe(s): Secondary | ICD-10-CM | POA: Diagnosis not present

## 2017-02-26 DIAGNOSIS — G4733 Obstructive sleep apnea (adult) (pediatric): Secondary | ICD-10-CM | POA: Diagnosis not present

## 2017-02-27 ENCOUNTER — Ambulatory Visit: Payer: PPO

## 2017-02-27 DIAGNOSIS — M6281 Muscle weakness (generalized): Secondary | ICD-10-CM

## 2017-02-27 DIAGNOSIS — R262 Difficulty in walking, not elsewhere classified: Secondary | ICD-10-CM

## 2017-02-27 NOTE — Therapy (Signed)
[place] MAIN [facility] SERVICES [address] [place], [place], [address] Phone: [phone]   Fax:  [phone]  Physical Therapy Treatment  Patient Details  Name: Daniel Eaton. MRN: [id number] Date of Birth: 03-12-1940 Referring Provider: Dr. Ouida Sills   Encounter Date: 02/27/2017  PT End of Session - 02/27/17 1104    Visit Number  52    Number of Visits  64    Date for PT Re-Evaluation  03/29/17    Authorization Type  2/10    PT Start Time  1031    PT Stop Time  1115    PT Time Calculation (min)  44 min    Equipment Utilized During Treatment  Gait belt min guard to S prn    Activity Tolerance  Patient tolerated treatment well    Behavior During Therapy  WFL for tasks assessed/performed       Past Medical History:  Diagnosis Date  . A-fib ([place])   . CHF (congestive heart failure) ([place])   . COPD (chronic obstructive pulmonary disease) ([place])   . Difficult intubation   . Hyperlipidemia   . Hypertension   . Hypothyroidism   . Kidney disease   . Sleep apnea   . Stroke ([place])   . Thyroid disease     Past Surgical History:  Procedure Laterality Date  . CARDIAC CATHETERIZATION    . CATARACT EXTRACTION W/ INTRAOCULAR LENS  IMPLANT, BILATERAL    . COLONOSCOPY  2008  . HERNIA REPAIR     bilateral inguinal hernia/ [place]  . HERNIA REPAIR  02/02/2015   18 x 28 cm ventral light mesh placed laparoscopically.  . TONSILLECTOMY    . UMBILICAL HERNIA REPAIR N/A 02/02/2015   Procedure: HERNIA REPAIR UMBILICAL ADULT;  Surgeon: Robert Bellow, MD;  Location: [facility] ORS;  Service: General;  Laterality: N/A;  . VENTRAL HERNIA REPAIR N/A 02/02/2015   Procedure: LAPAROSCOPIC VENTRAL HERNIA;  Surgeon: Robert Bellow, MD;  Location: [facility] ORS;  Service: General;  Laterality: N/A;  . vp shunt placement  1979    There were no vitals filed for this visit.  Subjective Assessment - 02/27/17 1035    Subjective  Patient reports leg lacerations  healing well. Having continued breathing difficulty that is affecting his ability to move, will see doctor on the 31st.     Pertinent History  Pt reports a history of rapid onset balance difficulty since 2014. He reports that the initial episode occurred due to improper use of diuretic with hypokalemia. One year later he had another episode with a fall. He was sent to ENT and had a VNG study which was normal. ENT also ordered a head CT and MRI. MRI showed chronic R MCA infarct. Pt comes for [employer] 2-3x/wk. He has completed pulmonary rehab at [facility] twice. Pt denies dizziness or vertigo but reports difficulty with his balance     Patient Stated Goals  Improve balance so that he only needs cane or no assistive device    Currently in Pain?  No/denies       Ambulate 96 ft with SPC, walked 40 ft without cane, utilized cane occasionally for " safety blanket" . Increased upright posture   Ambulate in hall with SPC (100 ft) x 2 with horizontal head turns reading cards on either side of wall to promote stability, One LOB requiring PT assistance to regain stability. Frequent excessive trunk flexion due to instability of trunk. Seated rest break between laps.  Scapular retractions seated  With purse lipped breathing 15x     seated abduction RTB around ankles 15x each leg  With scapular retractions    Seated knee extensions RTB around ankles 15x each leg.    Seated adduction with ball 1x 15 5 second holds     Resisted pf seated RTB 15x   Resisted df seated RTB 15x   Resisted marching RTB 15x seated   Pt. response to medical necessity: . Patient will continue to benefit from skilled physical therapy to improve strength and balance                        PT Education - 02/27/17 1040    Education provided  Yes    Education Details  HEP compliance, ambulating with cane and with functional strength    Person(s) Educated  Patient    Methods  Explanation;Demonstration;Verbal  cues    Comprehension  Verbalized understanding;Returned demonstration       PT Short Term Goals - 02/09/17 1138      PT SHORT TERM GOAL #1   Title  Patient will ambulate 90 ft without cane and without stopping to allow for increase in mobility and quality of life.     Baseline  12/27: ambulate 90 ft with cane and no rest break     Time  2    Period  Weeks    Status  Partially Met        PT Long Term Goals - 02/09/17 1123      PT LONG TERM GOAL #1   Title  Pt will be independent with HEP in order to improve strength and balance in order to decrease fall risk and improve function at home and work.    Baseline  continues to improve    Time  8    Period  Weeks    Status  On-going      PT LONG TERM GOAL #2   Title  Pt will improve BERG to 48 points in order to demonstrate clinically significant improvement in balance.     Baseline  04/19/16: 38/56; 05/24/16: 44/56 06/16/16: 43/56 08/16/16: 47/56 7/31: 49/56; 11/07/16: 42/56 10/24: 45/56 11/21: 48/56    Time  8    Period  Weeks    Status  Achieved      PT LONG TERM GOAL #3   Title  Pt will decrease TUG to below 25 seconds in order to demonstrate decreased fall risk and improved LE strength       Baseline  04/19/16: 36.15 seconds; 05/24/16: 20sec; 06/16/16: 22.07 seconds;     Time  8    Period  Weeks    Status  Achieved      PT LONG TERM GOAL #4   Title  Pt will increase 10MWT by at least 0.13 m/s in order to demonstrate clinically significant improvement in community ambulation.     Baseline  04/19/16: 0.75 m/s; 05/24/2016 .833; 06/16/16: 11.4s = 0.88 m/s 7/3: 13 m =.63ms, 731: .714 9/4: .43; 11/07/16: 0.74 m/s 11/21: . 67 without AD 12/27: .69 m/s     Time  8    Period  Days    Status  On-going      PT LONG TERM GOAL #5   Title  Pt will decrease TUG to below 20 seconds in order to demonstrate decreased fall risk and improved LE strength       Baseline  04/19/16: 36.15 seconds; 05/24/16:  20sec; 06/16/16: 22.07 seconds; 11/07/16: unable to perform  today due to weakness with transfers 11/21: 30 seconds without AD    Status  On-going      PT LONG TERM GOAL #6   Title  Patient will increase dynamic gait index score to >19/24 as to demonstrate reduced fall risk and improved dynamic gait balance for better safety with community/home ambulation.     Baseline  7/3: 14 8/7: 17/24; 11/07/16: 14/24 10/24: 14/24 12/27: 14/24    Time  6    Period  Weeks    Status  On-going            Plan - 02/27/17 1100    Clinical Impression Statement  Patient having improved breathing requiring patient to have decreased stopping breaks. Ambulating with head turns indicated patient has instability with dynamic mobility due to poor breathing pattern and increased LE fatigue from recent decrease in HEP compliance and mobility.  Patient will continue to benefit from skilled physical therapy to improve strength and balance    Rehab Potential  Good    Clinical Impairments Affecting Rehab Potential  Positive: motivation; Negative: chronic balance dysfunction, chronic CVA    PT Frequency  1x / week    PT Duration  6 weeks    PT Treatment/Interventions  ADLs/Self Care Home Management;Cryotherapy;Moist Heat;Neuromuscular re-education;Gait training;Functional mobility training;Stair training;Therapeutic activities;Therapeutic exercise;Balance training;Manual techniques;Aquatic Therapy;Canalith Repostioning;Electrical Stimulation;Traction;Ultrasound;DME Instruction;Patient/family education;Passive range of motion;Energy conservation;Vestibular    PT Next Visit Plan  walk with cane    PT Home Exercise Plan  As prescribed    Consulted and Agree with Plan of Care  Patient       Patient will benefit from skilled therapeutic intervention in order to improve the following deficits and impairments:  Abnormal gait, Decreased activity tolerance, Decreased balance, Decreased strength, Difficulty walking, Obesity, Decreased coordination  Visit Diagnosis: Difficulty in  walking, not elsewhere classified  Muscle weakness (generalized)     Problem List Patient Active Problem List   Diagnosis Date Noted  . Cough 03/20/2015  . Bronchitis, chronic obstructive w acute bronchitis ([place]) 03/20/2015  . Umbilical hernia without obstruction and without gangrene 01/12/2015  . Ventral hernia without obstruction or gangrene 01/02/2015   Janna Arch, PT, DPT   Janna Arch 02/27/2017, 11:39 AM  [place] MAIN [facility] SERVICES [address] [place], [place], [address] Phone: [phone]   Fax:  [phone]  Name: Daniel Eaton. MRN: [id number] Date of Birth: 07-Jul-1940

## 2017-03-06 ENCOUNTER — Ambulatory Visit: Payer: PPO

## 2017-03-06 DIAGNOSIS — M6281 Muscle weakness (generalized): Secondary | ICD-10-CM

## 2017-03-06 DIAGNOSIS — R262 Difficulty in walking, not elsewhere classified: Secondary | ICD-10-CM

## 2017-03-06 NOTE — Therapy (Signed)
[facility] REH[facility] SERVICES [address] [place], [place], [address] Phone: [phone]   Fax:  [phone]  Physical Therapy Treatment  Patient Details  Name: Daniel L Stanek Jr. MRN: [id number] Date of Birth: 02/17/1940 Referring Provider: Dr. Anderson   Encounter Date: 03/06/2017  PT End of Session - 03/06/17 1041    Visit Number  53    Number of Visits  64    Date for PT Re-Evaluation  03/29/17    Authorization Type  2/10    PT Start Time  1034    PT Stop Time  1115    PT Time Calculation (min)  41 min    Equipment Utilized During Treatment  Gait belt min guard to S prn    Activity Tolerance  Patient tolerated treatment well    Behavior During Therapy  WFL for tasks assessed/performed       Past Medical History:  Diagnosis Date  . A-fib (HCC)   . CHF (congestive heart failure) (HCC)   . COPD (chronic obstructive pulmonary disease) (HCC)   . Difficult intubation   . Hyperlipidemia   . Hypertension   . Hypothyroidism   . Kidney disease   . Sleep apnea   . Stroke (HCC)   . Thyroid disease     Past Surgical History:  Procedure Laterality Date  . CARDIAC CATHETERIZATION    . CATARACT EXTRACTION W/ INTRAOCULAR LENS  IMPLANT, BILATERAL    . COLONOSCOPY  2008  . HERNIA REPAIR     bilateral inguinal hernia/ [facility]  . HERNIA REPAIR  02/02/2015   18 x 28 cm ventral light mesh placed laparoscopically.  . TONSILLECTOMY    . UMBILICAL HERNIA REPAIR N/A 02/02/2015   Procedure: HERNIA REPAIR UMBILICAL ADULT;  Surgeon: Jeffrey W Byrnett, MD;  Location: [facility] ORS;  Service: General;  Laterality: N/A;  . VENTRAL HERNIA REPAIR N/A 02/02/2015   Procedure: LAPAROSCOPIC VENTRAL HERNIA;  Surgeon: Jeffrey W Byrnett, MD;  Location: [facility] ORS;  Service: General;  Laterality: N/A;  . vp shunt placement  1979    There were no vitals filed for this visit.  Subjective Assessment - 03/06/17 1039    Subjective  Patient reports being fatigued  today. Having bilateral knee pain with weight bearing. Having a " rough day".     Pertinent History  Pt reports a history of rapid onset balance difficulty since 2014. He reports that the initial episode occurred due to improper use of diuretic with hypokalemia. One year later he had another episode with a fall. He was sent to ENT and had a VNG study which was normal. ENT also ordered a head CT and MRI. MRI showed chronic R MCA infarct. Pt comes for [employer] 2-3x/wk. He has completed pulmonary rehab at [facility] twice. Pt denies dizziness or vertigo but reports difficulty with his balance     Patient Stated Goals  Improve balance so that he only needs cane or no assistive device    Currently in Pain?  Yes    Pain Score  6     Pain Location  Knee    Pain Orientation  Right;Left    Pain Descriptors / Indicators  Aching    Pain Type  Chronic pain    Pain Onset  More than a month ago    Pain Frequency  Intermittent       BP: 144/80 with pulse of 68 after ambulation due to excessive fatigue  Ambulate with SPC and CGA   Frequent excessive trunk flexion due to instability of trunk. Seated rest break between laps. Ambulation terminated due to excessive fatigue and difficulty breathing.    Scapular retractions seated  With purse lipped breathing 15x   seated LAQ 12x   seated abduction RTB around ankles 15x each leg  With scapular retractions    Seated knee extensions RTB around ankles 2x 15x each leg.    Seated adduction with ball 2x 15 5 second holds    Seated adduction squeezing ball between ankles with LAQ 10x   Resisted pf seated RTB 15x   Resisted df seated RTB 15x   Resisted marching RTB 15x seated  Seated hamstring stretch 2x30 seconds     Pt. response to medical necessity: . Patient will continue to benefit from skilled physical therapy to improve strength and balance                        PT Education - 03/06/17 1040    Education provided  Yes    Education  Details  HEP compliance, ambulating with cane and functional strength     Person(s) Educated  Patient    Methods  Explanation;Demonstration;Verbal cues    Comprehension  Verbalized understanding;Returned demonstration       PT Short Term Goals - 02/09/17 1138      PT SHORT TERM GOAL #1   Title  Patient will ambulate 90 ft without cane and without stopping to allow for increase in mobility and quality of life.     Baseline  12/27: ambulate 90 ft with cane and no rest break     Time  2    Period  Weeks    Status  Partially Met        PT Long Term Goals - 02/09/17 1123      PT LONG TERM GOAL #1   Title  Pt will be independent with HEP in order to improve strength and balance in order to decrease fall risk and improve function at home and work.    Baseline  continues to improve    Time  8    Period  Weeks    Status  On-going      PT LONG TERM GOAL #2   Title  Pt will improve BERG to 48 points in order to demonstrate clinically significant improvement in balance.     Baseline  04/19/16: 38/56; 05/24/16: 44/56 06/16/16: 43/56 08/16/16: 47/56 7/31: 49/56; 11/07/16: 42/56 10/24: 45/56 11/21: 48/56    Time  8    Period  Weeks    Status  Achieved      PT LONG TERM GOAL #3   Title  Pt will decrease TUG to below 25 seconds in order to demonstrate decreased fall risk and improved LE strength       Baseline  04/19/16: 36.15 seconds; 05/24/16: 20sec; 06/16/16: 22.07 seconds;     Time  8    Period  Weeks    Status  Achieved      PT LONG TERM GOAL #4   Title  Pt will increase 10MWT by at least 0.13 m/s in order to demonstrate clinically significant improvement in community ambulation.     Baseline  04/19/16: 0.75 m/s; 05/24/2016 .833; 06/16/16: 11.4s = 0.88 m/s 7/3: 13 m =.78m/s, 731: .714 9/4: .43; 11/07/16: 0.74 m/s 11/21: . 67 without AD 12/27: .69 m/s     Time  8    Period  Days      Status  On-going      PT LONG TERM GOAL #5   Title  Pt will decrease TUG to below 20 seconds in order to demonstrate  decreased fall risk and improved LE strength       Baseline  04/19/16: 36.15 seconds; 05/24/16: 20sec; 06/16/16: 22.07 seconds; 11/07/16: unable to perform today due to weakness with transfers 11/21: 30 seconds without AD    Status  On-going      PT LONG TERM GOAL #6   Title  Patient will increase dynamic gait index score to >19/24 as to demonstrate reduced fall risk and improved dynamic gait balance for better safety with community/home ambulation.     Baseline  7/3: 14 8/7: 17/24; 11/07/16: 14/24 10/24: 14/24 12/27: 14/24    Time  6    Period  Weeks    Status  On-going            Plan - 03/06/17 1055    Clinical Impression Statement  Patient limited throughout session today by fatigue and limited breathing capacity. Vitals monitored throughout session with patient maintaining safe values. Patient will continue to benefit from skilled physical therapy to improve strength and balance    Rehab Potential  Good    Clinical Impairments Affecting Rehab Potential  Positive: motivation; Negative: chronic balance dysfunction, chronic CVA    PT Frequency  1x / week    PT Duration  6 weeks    PT Treatment/Interventions  ADLs/Self Care Home Management;Cryotherapy;Moist Heat;Neuromuscular re-education;Gait training;Functional mobility training;Stair training;Therapeutic activities;Therapeutic exercise;Balance training;Manual techniques;Aquatic Therapy;Canalith Repostioning;Electrical Stimulation;Traction;Ultrasound;DME Instruction;Patient/family education;Passive range of motion;Energy conservation;Vestibular    PT Next Visit Plan  walk with cane    PT Home Exercise Plan  As prescribed    Consulted and Agree with Plan of Care  Patient       Patient will benefit from skilled therapeutic intervention in order to improve the following deficits and impairments:  Abnormal gait, Decreased activity tolerance, Decreased balance, Decreased strength, Difficulty walking, Obesity, Decreased coordination  Visit  Diagnosis: Difficulty in walking, not elsewhere classified  Muscle weakness (generalized)     Problem List Patient Active Problem List   Diagnosis Date Noted  . Cough 03/20/2015  . Bronchitis, chronic obstructive w acute bronchitis ([place]) 03/20/2015  . Umbilical hernia without obstruction and without gangrene 01/12/2015  . Ventral hernia without obstruction or gangrene 01/02/2015   Janna Arch, PT, DPT   Janna Arch 03/06/2017, 11:14 AM  [place] MAIN [facility] SERVICES [address] [place], [place], [address] Phone: [phone]   Fax:  [phone]  Name: Ulysees Robarts. MRN: [id number] Date of Birth: 08-22-1940

## 2017-03-09 DIAGNOSIS — E039 Hypothyroidism, unspecified: Secondary | ICD-10-CM | POA: Diagnosis not present

## 2017-03-09 DIAGNOSIS — R740 Nonspecific elevation of levels of transaminase and lactic acid dehydrogenase [LDH]: Secondary | ICD-10-CM | POA: Diagnosis not present

## 2017-03-09 DIAGNOSIS — G737 Myopathy in diseases classified elsewhere: Secondary | ICD-10-CM | POA: Diagnosis not present

## 2017-03-09 DIAGNOSIS — I10 Essential (primary) hypertension: Secondary | ICD-10-CM | POA: Diagnosis not present

## 2017-03-13 ENCOUNTER — Encounter: Payer: Self-pay | Admitting: Physical Therapy

## 2017-03-13 ENCOUNTER — Ambulatory Visit: Payer: PPO | Admitting: Physical Therapy

## 2017-03-13 DIAGNOSIS — M6281 Muscle weakness (generalized): Secondary | ICD-10-CM

## 2017-03-13 DIAGNOSIS — R262 Difficulty in walking, not elsewhere classified: Secondary | ICD-10-CM | POA: Diagnosis not present

## 2017-03-13 NOTE — Therapy (Signed)
[place] MAIN [facility] SERVICES [address] [place], [place], [address] Phone: [phone]   Fax:  [phone]  Physical Therapy Treatment  Patient Details  Name: Daniel Eaton. MRN: [id number] Date of Birth: 09-24-1940 Referring Provider: Dr. Ouida Sills   Encounter Date: 03/13/2017  PT End of Session - 03/13/17 1115    Visit Number  54    Number of Visits  64    Date for PT Re-Evaluation  03/29/17    Authorization Type  2/10    PT Start Time  1105    PT Stop Time  1130    PT Time Calculation (min)  25 min    Equipment Utilized During Treatment  Gait belt min guard to S prn    Activity Tolerance  Patient limited by pain    Behavior During Therapy  [facility] for tasks assessed/performed;Anxious       Past Medical History:  Diagnosis Date  . A-fib ([place])   . CHF (congestive heart failure) ([place])   . COPD (chronic obstructive pulmonary disease) ([place])   . Difficult intubation   . Hyperlipidemia   . Hypertension   . Hypothyroidism   . Kidney disease   . Sleep apnea   . Stroke ([place])   . Thyroid disease     Past Surgical History:  Procedure Laterality Date  . CARDIAC CATHETERIZATION    . CATARACT EXTRACTION W/ INTRAOCULAR LENS  IMPLANT, BILATERAL    . COLONOSCOPY  2008  . HERNIA REPAIR     bilateral inguinal hernia/ [facility]  . HERNIA REPAIR  02/02/2015   18 x 28 cm ventral light mesh placed laparoscopically.  . TONSILLECTOMY    . UMBILICAL HERNIA REPAIR N/A 02/02/2015   Procedure: HERNIA REPAIR UMBILICAL ADULT;  Surgeon: Robert Bellow, MD;  Location: [facility] ORS;  Service: General;  Laterality: N/A;  . VENTRAL HERNIA REPAIR N/A 02/02/2015   Procedure: LAPAROSCOPIC VENTRAL HERNIA;  Surgeon: Robert Bellow, MD;  Location: [facility] ORS;  Service: General;  Laterality: N/A;  . vp shunt placement  1979    There were no vitals filed for this visit.  Subjective Assessment - 03/13/17 1111    Subjective  Patient reports having left knee  pain following a visit to [facility] for a session with students. He had increased pain following the visit and he was not able to weight bear following the session for 3 days. He has continued to have discomfort for the next 4 days but it is getting better. He does not want to do anything that might irritate his left knee today.     Pertinent History  Pt reports a history of rapid onset balance difficulty since 2014. He reports that the initial episode occurred due to improper use of diuretic with hypokalemia. One year later he had another episode with a fall. He was sent to ENT and had a VNG study which was normal. ENT also ordered a head CT and MRI. MRI showed chronic R MCA infarct. Pt comes for [employer] 2-3x/wk. He has completed pulmonary rehab at [facility] twice. Pt denies dizziness or vertigo but reports difficulty with his balance     Limitations  Walking    Patient Stated Goals  Improve balance so that he only needs cane or no assistive device    Currently in Pain?  Yes    Pain Score  4     Pain Location  Knee    Pain Orientation  Left  Pain Descriptors / Indicators  Sharp    Pain Type  Acute pain    Pain Onset  In the past 7 days    Pain Frequency  Intermittent    Aggravating Factors   weight bearing    Pain Relieving Factors  sitting , resting    Effect of Pain on Daily Activities  unable to walk as much    Multiple Pain Sites  No       Therapeutic activities Outcome measures performed ; TUG was attempted but patient having very difficulty time performing sit to stand from the chair without UE's, Unable to perform the test. The transfer was performed with getting another chair for him to push up on and pushing a a stool that was stabilized by PT and it took repeated attempts  67 MW was performed and goals addressed  DGI was performed and goals addressed                         PT Education - 03/13/17 1114    Education provided  Yes    Education Details  use of  heat or ice    Person(s) Educated  Patient    Methods  Explanation    Comprehension  Verbalized understanding       PT Short Term Goals - 03/13/17 1106      PT SHORT TERM GOAL #1   Title  Patient will ambulate 90 ft without cane and without stopping to allow for increase in mobility and quality of life.     Baseline  12/27: ambulate 90 ft with cane and no rest break , 03/13/17 unable to walk with the cane and needs a rollator due to increased left knee pain.    Time  2    Period  Weeks    Status  Partially Met    Target Date  05/08/17        PT Long Term Goals - 03/13/17 1115      PT LONG TERM GOAL #1   Title  Pt will be independent with HEP in order to improve strength and balance in order to decrease fall risk and improve function at home and work.    Baseline  continues to improve    Time  8    Period  Weeks    Status  On-going    Target Date  05/08/17      PT LONG TERM GOAL #2   Title  Pt will improve BERG to 48 points in order to demonstrate clinically significant improvement in balance.     Baseline  04/19/16: 38/56; 05/24/16: 44/56 06/16/16: 43/56 08/16/16: 47/56 7/31: 49/56; 11/07/16: 42/56 10/24: 45/56 11/21: 48/56    Time  8    Period  Weeks    Status  Achieved      PT LONG TERM GOAL #3   Title  Pt will decrease TUG to below 25 seconds in order to demonstrate decreased fall risk and improved LE strength       Baseline  04/19/16: 36.15 seconds; 05/24/16: 20sec; 06/16/16: 22.07 seconds;     Time  8    Period  Weeks    Status  Achieved      PT LONG TERM GOAL #4   Title  Pt will increase 10MWT by at least 0.13 m/s in order to demonstrate clinically significant improvement in community ambulation.     Baseline  04/19/16: 0.75 m/s; 05/24/2016 .833; 06/16/16: 11.4s =  0.88 m/s 7/3: 13 m =.59ms, 731: .714 9/4: .43; 11/07/16: 0.74 m/s 11/21: . 67 without AD 12/27: .69 m/s ,03/13/17 . 60 M/sec     Time  8    Period  Days    Status  On-going    Target Date  05/08/17      PT LONG TERM  GOAL #5   Title  Pt will decrease TUG to below 20 seconds in order to demonstrate decreased fall risk and improved LE strength       Baseline  04/19/16: 36.15 seconds; 05/24/16: 20sec; 06/16/16: 22.07 seconds; 11/07/16: unable to perform today due to weakness with transfers 11/21: 30 seconds without AD; 03/03/17 Patient is not able to perform sit to stand today from low chair. , not able to assess    Time  8    Period  Weeks    Status  On-going    Target Date  05/08/17      PT LONG TERM GOAL #6   Title  Patient will increase dynamic gait index score to >19/24 as to demonstrate reduced fall risk and improved dynamic gait balance for better safety with community/home ambulation.     Baseline  7/3: 14 8/7: 17/24; 11/07/16: 14/24 10/24: 14/24 12/27: 14/24; 03/13/17 14/24    Time  6    Period  Weeks    Status  On-going            Plan - 03/13/17 1210    Clinical Impression Statement  Patient has left knee pain today that is interfering with treatment. Patient performed outcome measures with mimimal progress ; this is most likely due to having new onset L knee pain. He has great difficulty with sit to stand today due to left knee pain. His goals were assessed and reviewed. Patient will continue to benefit from skilled PT to improve strength and reach goals.     Rehab Potential  Good    Clinical Impairments Affecting Rehab Potential  Positive: motivation; Negative: chronic balance dysfunction, chronic CVA    PT Frequency  1x / week    PT Duration  6 weeks    PT Treatment/Interventions  ADLs/Self Care Home Management;Cryotherapy;Moist Heat;Neuromuscular re-education;Gait training;Functional mobility training;Stair training;Therapeutic activities;Therapeutic exercise;Balance training;Manual techniques;Aquatic Therapy;Canalith Repostioning;Electrical Stimulation;Traction;Ultrasound;DME Instruction;Patient/family education;Passive range of motion;Energy conservation;Vestibular    PT Next Visit Plan  walk  with cane    PT Home Exercise Plan  As prescribed    Consulted and Agree with Plan of Care  Patient       Patient will benefit from skilled therapeutic intervention in order to improve the following deficits and impairments:  Abnormal gait, Decreased activity tolerance, Decreased balance, Decreased strength, Difficulty walking, Obesity, Decreased coordination  Visit Diagnosis: Difficulty in walking, not elsewhere classified  Muscle weakness (generalized)     Problem List Patient Active Problem List   Diagnosis Date Noted  . Cough 03/20/2015  . Bronchitis, [place] w [place] bronchitis ([place] 03/20/2015  . Umbilical hernia without obstruction and without gangrene 01/12/2015  . Ventral hernia without obstruction or gangrene 01/02/2015    MAlanson Puls PVirginiaDPT 03/13/2017, 12:34 PM  [facility] [address] [address]Phone: [phone]  Fax:  [phone] Name: Daniel Eaton MRN: [id number] of Birth: 303-02-1940

## 2017-03-16 DIAGNOSIS — G4733 Obstructive sleep apnea (adult) (pediatric): Secondary | ICD-10-CM | POA: Diagnosis not present

## 2017-03-16 DIAGNOSIS — Z0000 Encounter for general adult medical examination without abnormal findings: Secondary | ICD-10-CM | POA: Diagnosis not present

## 2017-03-16 DIAGNOSIS — I482 Chronic atrial fibrillation: Secondary | ICD-10-CM | POA: Diagnosis not present

## 2017-03-16 DIAGNOSIS — N183 Chronic kidney disease, stage 3 (moderate): Secondary | ICD-10-CM | POA: Diagnosis not present

## 2017-03-16 DIAGNOSIS — J42 Unspecified chronic bronchitis: Secondary | ICD-10-CM | POA: Diagnosis not present

## 2017-03-16 DIAGNOSIS — I129 Hypertensive chronic kidney disease with stage 1 through stage 4 chronic kidney disease, or unspecified chronic kidney disease: Secondary | ICD-10-CM | POA: Diagnosis not present

## 2017-03-16 DIAGNOSIS — Z6841 Body Mass Index (BMI) 40.0 and over, adult: Secondary | ICD-10-CM | POA: Diagnosis not present

## 2017-03-16 DIAGNOSIS — I429 Cardiomyopathy, unspecified: Secondary | ICD-10-CM | POA: Diagnosis not present

## 2017-03-16 DIAGNOSIS — I700 Atherosclerosis of aorta: Secondary | ICD-10-CM | POA: Diagnosis not present

## 2017-03-16 DIAGNOSIS — E039 Hypothyroidism, unspecified: Secondary | ICD-10-CM | POA: Diagnosis not present

## 2017-03-20 ENCOUNTER — Ambulatory Visit: Payer: PPO | Attending: Internal Medicine

## 2017-03-20 DIAGNOSIS — R262 Difficulty in walking, not elsewhere classified: Secondary | ICD-10-CM | POA: Diagnosis not present

## 2017-03-20 DIAGNOSIS — R269 Unspecified abnormalities of gait and mobility: Secondary | ICD-10-CM | POA: Insufficient documentation

## 2017-03-20 DIAGNOSIS — M6281 Muscle weakness (generalized): Secondary | ICD-10-CM

## 2017-03-20 NOTE — Therapy (Signed)
[place] MAIN [facility] SERVICES [address] [place], [place], [address] Phone: [phone]   Fax:  [phone]  Physical Therapy Treatment  Patient Details  Name: Daniel Eaton. MRN: [id number] Date of Birth: 1941-01-01 Referring Provider: Dr. Ouida Sills   Encounter Date: 03/20/2017  PT End of Session - 03/20/17 1044    Visit Number  55    Number of Visits  64    Date for PT Re-Evaluation  03/29/17    Authorization Type  2/10    PT Start Time  1030    PT Stop Time  1110    PT Time Calculation (min)  40 min    Equipment Utilized During Treatment  Gait belt min guard to S prn    Activity Tolerance  Patient limited by pain    Behavior During Therapy  [facility] for tasks assessed/performed;Anxious       Past Medical History:  Diagnosis Date  . A-fib ([place])   . CHF (congestive heart failure) ([place])   . COPD (chronic obstructive pulmonary disease) ([place])   . Difficult intubation   . Hyperlipidemia   . Hypertension   . Hypothyroidism   . Kidney disease   . Sleep apnea   . Stroke ([place])   . Thyroid disease     Past Surgical History:  Procedure Laterality Date  . CARDIAC CATHETERIZATION    . CATARACT EXTRACTION W/ INTRAOCULAR LENS  IMPLANT, BILATERAL    . COLONOSCOPY  2008  . HERNIA REPAIR     bilateral inguinal hernia/ [facility]  . HERNIA REPAIR  02/02/2015   18 x 28 cm ventral light mesh placed laparoscopically.  . TONSILLECTOMY    . UMBILICAL HERNIA REPAIR N/A 02/02/2015   Procedure: HERNIA REPAIR UMBILICAL ADULT;  Surgeon: Robert Bellow, MD;  Location: [facility] ORS;  Service: General;  Laterality: N/A;  . VENTRAL HERNIA REPAIR N/A 02/02/2015   Procedure: LAPAROSCOPIC VENTRAL HERNIA;  Surgeon: Robert Bellow, MD;  Location: [facility] ORS;  Service: General;  Laterality: N/A;  . vp shunt placement  1979    There were no vitals filed for this visit.   Subjective Assessment - 03/20/17 1041    Subjective  Patient reports continued pain in  L knee with weight bearing. Could not go to [place] session the day after last session. No pain on non weightbearing.     Pertinent History  Pt reports a history of rapid onset balance difficulty since 2014. He reports that the initial episode occurred due to improper use of diuretic with hypokalemia. One year later he had another episode with a fall. He was sent to ENT and had a VNG study which was normal. ENT also ordered a head CT and MRI. MRI showed chronic R MCA infarct. Pt comes for [employer] 2-3x/wk. He has completed pulmonary rehab at [facility] twice. Pt denies dizziness or vertigo but reports difficulty with his balance     Limitations  Walking    Patient Stated Goals  Improve balance so that he only needs cane or no assistive device    Currently in Pain?  Yes    Pain Score  5     Pain Location  Knee    Pain Orientation  Left    Pain Descriptors / Indicators  Stabbing    Pain Type  Acute pain    Pain Onset  1 to 4 weeks ago    Pain Frequency  Intermittent    Aggravating Factors   weight  bearing    Pain Relieving Factors  non weight bearing       Scapular retractions seated  With purse lipped breathing 15x  Seated marches 2x 20, verbal cues for height of contraction ,keeping upright posture.    seated LAQ 12x   seated abduction GTB around knees 2x15x each leg.  With scapular retractions    Seated knee extensions RTB around ankles 2x 15x each leg.    Seated adduction with ball 2x 15 5 second holds    Seated adduction squeezing ball between ankles with LAQ 10x   Resisted pf seated GTB 15x   Resisted df seated GTB 15x   Seated hamstring stretch 2x30 seconds     Pt. response to medical necessity: . Patient will continue to benefit from skilled physical therapy to improve strength and balance                         PT Education - 03/20/17 1043    Education provided  Yes    Education Details  LE strength and mobility     Person(s) Educated  Patient     Methods  Explanation;Demonstration;Verbal cues    Comprehension  Verbalized understanding;Returned demonstration       PT Short Term Goals - 03/13/17 1106      PT SHORT TERM GOAL #1   Title  Patient will ambulate 90 ft without cane and without stopping to allow for increase in mobility and quality of life.     Baseline  12/27: ambulate 90 ft with cane and no rest break , 03/13/17 unable to walk with the cane and needs a rollator due to increased left knee pain.    Time  2    Period  Weeks    Status  Partially Met    Target Date  05/08/17        PT Long Term Goals - 03/13/17 1115      PT LONG TERM GOAL #1   Title  Pt will be independent with HEP in order to improve strength and balance in order to decrease fall risk and improve function at home and work.    Baseline  continues to improve    Time  8    Period  Weeks    Status  On-going    Target Date  05/08/17      PT LONG TERM GOAL #2   Title  Pt will improve BERG to 48 points in order to demonstrate clinically significant improvement in balance.     Baseline  04/19/16: 38/56; 05/24/16: 44/56 06/16/16: 43/56 08/16/16: 47/56 7/31: 49/56; 11/07/16: 42/56 10/24: 45/56 11/21: 48/56    Time  8    Period  Weeks    Status  Achieved      PT LONG TERM GOAL #3   Title  Pt will decrease TUG to below 25 seconds in order to demonstrate decreased fall risk and improved LE strength       Baseline  04/19/16: 36.15 seconds; 05/24/16: 20sec; 06/16/16: 22.07 seconds;     Time  8    Period  Weeks    Status  Achieved      PT LONG TERM GOAL #4   Title  Pt will increase 10MWT by at least 0.13 m/s in order to demonstrate clinically significant improvement in community ambulation.     Baseline  04/19/16: 0.75 m/s; 05/24/2016 .833; 06/16/16: 11.4s = 0.88 m/s 7/3: 13 m =.59ms, 731: .714 9/4: .  43; 11/07/16: 0.74 m/s 11/21: . 67 without AD 12/27: .69 m/s ,03/13/17 . 60 M/sec     Time  8    Period  Days    Status  On-going    Target Date  05/08/17      PT LONG TERM  GOAL #5   Title  Pt will decrease TUG to below 20 seconds in order to demonstrate decreased fall risk and improved LE strength       Baseline  04/19/16: 36.15 seconds; 05/24/16: 20sec; 06/16/16: 22.07 seconds; 11/07/16: unable to perform today due to weakness with transfers 11/21: 30 seconds without AD; 03/03/17 Patient is not able to perform sit to stand today from low chair. , not able to assess    Time  8    Period  Weeks    Status  On-going    Target Date  05/08/17      PT LONG TERM GOAL #6   Title  Patient will increase dynamic gait index score to >19/24 as to demonstrate reduced fall risk and improved dynamic gait balance for better safety with community/home ambulation.     Baseline  7/3: 14 8/7: 17/24; 11/07/16: 14/24 10/24: 14/24 12/27: 14/24; 03/13/17 14/24    Time  6    Period  Weeks    Status  On-going            Plan - 03/20/17 1059    Clinical Impression Statement  Patient unable to perform weight bearing interventions this session due to pain in l knee upon weight bearing. Patient educated on RICE procedure and to call doctor if pain continues to progress.  Patient has noted tenderness to Lateral meniscus region. Patient will continue to benefit from skilled physical therapy to improve strength and balance    Rehab Potential  Good    Clinical Impairments Affecting Rehab Potential  Positive: motivation; Negative: chronic balance dysfunction, chronic CVA    PT Frequency  1x / week    PT Duration  6 weeks    PT Treatment/Interventions  ADLs/Self Care Home Management;Cryotherapy;Moist Heat;Neuromuscular re-education;Gait training;Functional mobility training;Stair training;Therapeutic activities;Therapeutic exercise;Balance training;Manual techniques;Aquatic Therapy;Canalith Repostioning;Electrical Stimulation;Traction;Ultrasound;DME Instruction;Patient/family education;Passive range of motion;Energy conservation;Vestibular    PT Next Visit Plan  recert?    PT Home Exercise Plan  As  prescribed    Consulted and Agree with Plan of Care  Patient       Patient will benefit from skilled therapeutic intervention in order to improve the following deficits and impairments:  Abnormal gait, Decreased activity tolerance, Decreased balance, Decreased strength, Difficulty walking, Obesity, Decreased coordination  Visit Diagnosis: Difficulty in walking, not elsewhere classified  Muscle weakness (generalized)  Gait difficulty     Problem List Patient Active Problem List   Diagnosis Date Noted  . Cough 03/20/2015  . Bronchitis, chronic obstructive w acute bronchitis ([place]) 03/20/2015  . Umbilical hernia without obstruction and without gangrene 01/12/2015  . Ventral hernia without obstruction or gangrene 01/02/2015   Janna Arch, PT, DPT   Janna Arch 03/20/2017, 11:15 AM  [place] MAIN [facility] SERVICES [address] [place], [place], [address] Phone: [phone]   Fax:  [phone]  Name: Joshva Labreck. MRN: [id number] Date of Birth: 31-Jan-1941

## 2017-03-27 ENCOUNTER — Ambulatory Visit: Payer: PPO

## 2017-03-27 DIAGNOSIS — R262 Difficulty in walking, not elsewhere classified: Secondary | ICD-10-CM

## 2017-03-27 DIAGNOSIS — R269 Unspecified abnormalities of gait and mobility: Secondary | ICD-10-CM

## 2017-03-27 DIAGNOSIS — M6281 Muscle weakness (generalized): Secondary | ICD-10-CM

## 2017-03-27 NOTE — Therapy (Signed)
[name] [facility] SERVICES [address] [place], [place], [address] Phone: [phone]   Fax:  [phone]  Physical Therapy Treatment  Patient Details  Name: Daniel Eaton. MRN: [id number] Date of Birth: 10/12/1940 Referring Provider: Dr. Ouida Sills   Encounter Date: 03/27/2017  PT End of Session - 03/27/17 1240    Visit Number  56    Number of Visits  64    Date for PT Re-Evaluation  05/22/17    PT Start Time  1030    PT Stop Time  1114    PT Time Calculation (min)  44 min    Equipment Utilized During Treatment  Gait belt min guard to S prn    Activity Tolerance  Patient tolerated treatment well;Patient limited by fatigue    Behavior During Therapy  [facility] for tasks assessed/performed;Anxious       Past Medical History:  Diagnosis Date  . A-fib ([place])   . CHF (congestive heart failure) ([place])   . COPD (chronic obstructive pulmonary disease) ([place])   . Difficult intubation   . Hyperlipidemia   . Hypertension   . Hypothyroidism   . Kidney disease   . Sleep apnea   . Stroke ([place])   . Thyroid disease     Past Surgical History:  Procedure Laterality Date  . CARDIAC CATHETERIZATION    . CATARACT EXTRACTION W/ INTRAOCULAR LENS  IMPLANT, BILATERAL    . COLONOSCOPY  2008  . HERNIA REPAIR     bilateral inguinal hernia/ [facility]  . HERNIA REPAIR  02/02/2015   18 x 28 cm ventral light mesh placed laparoscopically.  . TONSILLECTOMY    . UMBILICAL HERNIA REPAIR N/A 02/02/2015   Procedure: HERNIA REPAIR UMBILICAL ADULT;  Surgeon: Robert Bellow, MD;  Location: [facility] ORS;  Service: General;  Laterality: N/A;  . VENTRAL HERNIA REPAIR N/A 02/02/2015   Procedure: LAPAROSCOPIC VENTRAL HERNIA;  Surgeon: Robert Bellow, MD;  Location: [facility] ORS;  Service: General;  Laterality: N/A;  . vp shunt placement  1979    There were no vitals filed for this visit.  Subjective Assessment - 03/27/17 1238    Subjective  Patient having increased  shoulder pain that is getting progressively worse. Knee pain continues to persist occasionally but overall is getting better.     Pertinent History  Pt reports a history of rapid onset balance difficulty since 2014. He reports that the initial episode occurred due to improper use of diuretic with hypokalemia. One year later he had another episode with a fall. He was sent to ENT and had a VNG study which was normal. ENT also ordered a head CT and MRI. MRI showed chronic R MCA infarct. Pt comes for [employer] 2-3x/wk. He has completed pulmonary rehab at [facility] twice. Pt denies dizziness or vertigo but reports difficulty with his balance     Limitations  Walking    Patient Stated Goals  Improve balance so that he only needs cane or no assistive device    Currently in Pain?  Yes    Pain Score  6     Pain Location  Shoulder    Pain Orientation  Right    Pain Descriptors / Indicators  Stabbing    Pain Type  Acute pain    Pain Onset  1 to 4 weeks ago    Pain Frequency  Intermittent    Aggravating Factors   weight bearing through arm  10 MWT: 12 seconds; . 83 m/s  TUG: 28 seconds DGI= 14/24  ambulate 90 ft with cane and CGA no resting breaks standing or seated.   5x STS from raised plinth , x2 trials, cues for equal weight acceptance, nose over toes, utilization of LE [facility] PT Assessment - 03/27/17 0001      Dynamic Gait Index   Level Surface  Mild Impairment    Change in Gait Speed  Mild Impairment    Gait with Horizontal Head Turns  Mild Impairment    Gait with Vertical Head Turns  Mild Impairment    Gait and Pivot Turn  Mild Impairment    Step Over Obstacle  Moderate Impairment    Step Around Obstacles  Mild Impairment    Steps  Moderate Impairment    Total Score  14                          PT Education - 03/27/17 1239    Education provided  Yes    Education Details  goals, recert, understanding of need for compliance     Person(s) Educated  Patient     Methods  Explanation;Demonstration;Verbal cues    Comprehension  Verbalized understanding;Returned demonstration       PT Short Term Goals - 03/27/17 1104      PT SHORT TERM GOAL #1   Title  Patient will ambulate 90 ft without cane and without stopping to allow for increase in mobility and quality of life.     Baseline  12/27: ambulate 90 ft with cane and no rest break , 03/13/17 unable to walk with the cane and needs a rollator due to increased left knee pain. 2/11: walk 90 ft with cane and no breaks     Time  2    Period  Weeks    Status  Partially Met        PT Long Term Goals - 03/27/17 1037      PT LONG TERM GOAL #1   Title  Pt will be independent with HEP in order to improve strength and balance in order to decrease fall risk and improve function at home and work.    Baseline  continues to improve    Time  8    Period  Weeks    Status  On-going      PT LONG TERM GOAL #2   Title  Pt will improve BERG to 48 points in order to demonstrate clinically significant improvement in balance.     Baseline  04/19/16: 38/56; 05/24/16: 44/56 06/16/16: 43/56 08/16/16: 47/56 7/31: 49/56; 11/07/16: 42/56 10/24: 45/56 11/21: 48/56    Time  8    Period  Weeks    Status  Achieved      PT LONG TERM GOAL #3   Title  Pt will decrease TUG to below 25 seconds in order to demonstrate decreased fall risk and improved LE strength       Baseline  04/19/16: 36.15 seconds; 05/24/16: 20sec; 06/16/16: 22.07 seconds;     Time  8    Period  Weeks    Status  Achieved      PT LONG TERM GOAL #4   Title  Pt will increase 10MWT by at least 0.13 m/s in order to demonstrate clinically significant improvement in community ambulation.     Baseline  04/19/16: 0.75 m/s; 05/24/2016 .833; 06/16/16: 11.4s = 0.88 m/s 7/3: 13 m =.12ms, 731: .714  9/4: .43; 11/07/16: 0.74 m/s 11/21: . 67 without AD 12/27: .69 m/s ,03/13/17 . 60 M/sec ; 2/11: .83 m/s     Time  8    Period  Days    Status  Achieved      PT LONG TERM GOAL #5   Title  Pt  will decrease TUG to below 20 seconds in order to demonstrate decreased fall risk and improved LE strength       Baseline  04/19/16: 36.15 seconds; 05/24/16: 20sec; 06/16/16: 22.07 seconds; 11/07/16: unable to perform today due to weakness with transfers 11/21: 30 seconds without AD; 03/03/17 Patient is not able to perform sit to stand today from low chair. , not able to assess  2/11: 28 seconds    Time  8    Period  Weeks    Status  Partially Met      PT LONG TERM GOAL #6   Title  Patient will increase dynamic gait index score to >19/24 as to demonstrate reduced fall risk and improved dynamic gait balance for better safety with community/home ambulation.     Baseline  7/3: 14 8/7: 17/24; 11/07/16: 14/24 10/24: 14/24 12/27: 14/24; 03/13/17 14/24 2/11: 14/24    Time  6    Period  Weeks    Status  On-going            Plan - 03/27/17 1243    Clinical Impression Statement  Patient progressing towards goals at this time. Initially was challenged due to recent illness and knee pain from problems.  10 MWT- .83 m/s, TUG 28 seconds, DGI 14/24, patient can ambulate 90 ft with cane and no rest breaks at this time. Patient educated on need for compliance for continuation of physical therapy. Patient will continue to benefit from skilled physical therapy to improve strength and balance.     Rehab Potential  Good    Clinical Impairments Affecting Rehab Potential  Positive: motivation; Negative: chronic balance dysfunction, chronic CVA    PT Frequency  1x / week    PT Duration  8 weeks    PT Treatment/Interventions  ADLs/Self Care Home Management;Cryotherapy;Moist Heat;Neuromuscular re-education;Gait training;Functional mobility training;Stair training;Therapeutic activities;Therapeutic exercise;Balance training;Manual techniques;Aquatic Therapy;Canalith Repostioning;Electrical Stimulation;Traction;Ultrasound;DME Instruction;Patient/family education;Passive range of motion;Energy conservation;Vestibular    PT  Next Visit Plan  standing, walking, // bars    PT Home Exercise Plan  As prescribed    Consulted and Agree with Plan of Care  Patient       Patient will benefit from skilled therapeutic intervention in order to improve the following deficits and impairments:  Abnormal gait, Decreased activity tolerance, Decreased balance, Decreased strength, Difficulty walking, Obesity, Decreased coordination  Visit Diagnosis: Difficulty in walking, not elsewhere classified  Muscle weakness (generalized)  Gait difficulty     Problem List Patient Active Problem List   Diagnosis Date Noted  . Cough 03/20/2015  . Bronchitis, chronic obstructive w acute bronchitis ([place]) 03/20/2015  . Umbilical hernia without obstruction and without gangrene 01/12/2015  . Ventral hernia without obstruction or gangrene 01/02/2015   Janna Arch, PT, DPT    Janna Arch 03/27/2017, 12:46 PM  [place] MAIN [facility] SERVICES [address] [place], [place], [address] Phone: [phone]   Fax:  [phone]  Name: Daniel Eaton. MRN: [id number] Date of Birth: 07-07-1940

## 2017-03-29 DIAGNOSIS — G4733 Obstructive sleep apnea (adult) (pediatric): Secondary | ICD-10-CM | POA: Diagnosis not present

## 2017-04-03 ENCOUNTER — Ambulatory Visit: Payer: PPO

## 2017-04-03 DIAGNOSIS — R262 Difficulty in walking, not elsewhere classified: Secondary | ICD-10-CM | POA: Diagnosis not present

## 2017-04-03 DIAGNOSIS — M6281 Muscle weakness (generalized): Secondary | ICD-10-CM

## 2017-04-03 NOTE — Therapy (Signed)
[place] MAIN [facility] SERVICES [address] [place], [place], [address] Phone: [phone]   Fax:  [phone]  Physical Therapy Treatment  Patient Details  Name: Daniel Eaton. MRN: [id number] Date of Birth: 1940-06-03 Referring Provider: Dr. Ouida Sills   Encounter Date: 04/03/2017  PT End of Session - 04/03/17 1044    Visit Number  57    Number of Visits  64    Date for PT Re-Evaluation  05/22/17    PT Start Time  1030    PT Stop Time  1114    PT Time Calculation (min)  44 min    Equipment Utilized During Treatment  Gait belt min guard to S prn    Activity Tolerance  Patient tolerated treatment well;Patient limited by fatigue    Behavior During Therapy  [facility] for tasks assessed/performed;Anxious       Past Medical History:  Diagnosis Date  . A-fib ([place])   . CHF (congestive heart failure) ([place])   . COPD (chronic obstructive pulmonary disease) ([place])   . Difficult intubation   . Hyperlipidemia   . Hypertension   . Hypothyroidism   . Kidney disease   . Sleep apnea   . Stroke ([place])   . Thyroid disease     Past Surgical History:  Procedure Laterality Date  . CARDIAC CATHETERIZATION    . CATARACT EXTRACTION W/ INTRAOCULAR LENS  IMPLANT, BILATERAL    . COLONOSCOPY  2008  . HERNIA REPAIR     bilateral inguinal hernia/ [place]  . HERNIA REPAIR  02/02/2015   18 x 28 cm ventral light mesh placed laparoscopically.  . TONSILLECTOMY    . UMBILICAL HERNIA REPAIR N/A 02/02/2015   Procedure: HERNIA REPAIR UMBILICAL ADULT;  Surgeon: Robert Bellow, MD;  Location: [facility] ORS;  Service: General;  Laterality: N/A;  . VENTRAL HERNIA REPAIR N/A 02/02/2015   Procedure: LAPAROSCOPIC VENTRAL HERNIA;  Surgeon: Robert Bellow, MD;  Location: [facility] ORS;  Service: General;  Laterality: N/A;  . vp shunt placement  1979    There were no vitals filed for this visit.  Subjective Assessment - 04/03/17 1036    Subjective  Patient compliant with HEP,  performed every day. Went to chiropractor and having less pain in knees. Breathing is complicated by weather today.     Pertinent History  Pt reports a history of rapid onset balance difficulty since 2014. He reports that the initial episode occurred due to improper use of diuretic with hypokalemia. One year later he had another episode with a fall. He was sent to ENT and had a VNG study which was normal. ENT also ordered a head CT and MRI. MRI showed chronic R MCA infarct. Pt comes for [employer] 2-3x/wk. He has completed pulmonary rehab at [facility] twice. Pt denies dizziness or vertigo but reports difficulty with his balance     Limitations  Walking    Patient Stated Goals  Improve balance so that he only needs cane or no assistive device    Currently in Pain?  Yes    Pain Score  4     Pain Location  Knee    Pain Orientation  Right;Left    Pain Descriptors / Indicators  Stabbing    Pain Type  Acute pain    Pain Onset  1 to 4 weeks ago    Pain Frequency  Intermittent        96 ft x 3 trials with SPC ; no breaks  during each lap, patient challenged by breathing more than LE strength at this time, improved upright posture with decreased trunk flexion, increased step length initially that decreased with fatigue; last trial had 2 stops due to fatigue and breathing   Sit to stand from lower plinth table without UE support multiple trials  Seated scapular retractions with purse lipped breathing between walking trials 30 seconds .   Standing marching with walker 10x cues for 3 seconds up and 3 seconds down   Standing step backs with walker 10x each leg with BUS support,   Seated rockerboard: DF with 5 second holds 10x, PF with 5 second holds 10x   Cues for knee over ankle   Seated adduction squeezes 15x 3 seconds    Pt. response to medical necessity:  Patient will continue to benefit from skilled physical therapy to improve strength and balance                 PT Education -  04/03/17 1044    Education provided  Yes    Education Details  ambulatory mechanics, standing strengthening interventions     Person(s) Educated  Patient    Methods  Explanation;Demonstration;Verbal cues    Comprehension  Verbalized understanding;Returned demonstration       PT Short Term Goals - 03/27/17 1104      PT SHORT TERM GOAL #1   Title  Patient will ambulate 90 ft without cane and without stopping to allow for increase in mobility and quality of life.     Baseline  12/27: ambulate 90 ft with cane and no rest break , 03/13/17 unable to walk with the cane and needs a rollator due to increased left knee pain. 2/11: walk 90 ft with cane and no breaks     Time  2    Period  Weeks    Status  Partially Met        PT Long Term Goals - 03/27/17 1037      PT LONG TERM GOAL #1   Title  Pt will be independent with HEP in order to improve strength and balance in order to decrease fall risk and improve function at home and work.    Baseline  continues to improve    Time  8    Period  Weeks    Status  On-going      PT LONG TERM GOAL #2   Title  Pt will improve BERG to 48 points in order to demonstrate clinically significant improvement in balance.     Baseline  04/19/16: 38/56; 05/24/16: 44/56 06/16/16: 43/56 08/16/16: 47/56 7/31: 49/56; 11/07/16: 42/56 10/24: 45/56 11/21: 48/56    Time  8    Period  Weeks    Status  Achieved      PT LONG TERM GOAL #3   Title  Pt will decrease TUG to below 25 seconds in order to demonstrate decreased fall risk and improved LE strength       Baseline  04/19/16: 36.15 seconds; 05/24/16: 20sec; 06/16/16: 22.07 seconds;     Time  8    Period  Weeks    Status  Achieved      PT LONG TERM GOAL #4   Title  Pt will increase 10MWT by at least 0.13 m/s in order to demonstrate clinically significant improvement in community ambulation.     Baseline  04/19/16: 0.75 m/s; 05/24/2016 .833; 06/16/16: 11.4s = 0.88 m/s 7/3: 13 m =.58ms, 731: .714 9/4: .43; 11/07/16: 0.74  m/s  11/21: . 67 without AD 12/27: .69 m/s ,03/13/17 . 60 M/sec ; 2/11: .83 m/s     Time  8    Period  Days    Status  Achieved      PT LONG TERM GOAL #5   Title  Pt will decrease TUG to below 20 seconds in order to demonstrate decreased fall risk and improved LE strength       Baseline  04/19/16: 36.15 seconds; 05/24/16: 20sec; 06/16/16: 22.07 seconds; 11/07/16: unable to perform today due to weakness with transfers 11/21: 30 seconds without AD; 03/03/17 Patient is not able to perform sit to stand today from low chair. , not able to assess  2/11: 28 seconds    Time  8    Period  Weeks    Status  Partially Met      PT LONG TERM GOAL #6   Title  Patient will increase dynamic gait index score to >19/24 as to demonstrate reduced fall risk and improved dynamic gait balance for better safety with community/home ambulation.     Baseline  7/3: 14 8/7: 17/24; 11/07/16: 14/24 10/24: 14/24 12/27: 14/24; 03/13/17 14/24 2/11: 14/24    Time  6    Period  Weeks    Status  On-going            Plan - 04/03/17 1053    Clinical Impression Statement  Patient demonstrated improved gait mechanics with decreased velocity allowing for improved step length and upright posture. Breathing limited ambulatory duration at this time rather than LE strength or pain. Seated rest breaks required due to decreased tolerance for breathing.  Patient will continue to benefit from skilled physical therapy to improve strength and balance    Rehab Potential  Good    Clinical Impairments Affecting Rehab Potential  Positive: motivation; Negative: chronic balance dysfunction, chronic CVA    PT Frequency  1x / week    PT Duration  8 weeks    PT Treatment/Interventions  ADLs/Self Care Home Management;Cryotherapy;Moist Heat;Neuromuscular re-education;Gait training;Functional mobility training;Stair training;Therapeutic activities;Therapeutic exercise;Balance training;Manual techniques;Aquatic Therapy;Canalith Repostioning;Electrical  Stimulation;Traction;Ultrasound;DME Instruction;Patient/family education;Passive range of motion;Energy conservation;Vestibular    PT Next Visit Plan  Nustep new regiment with watts and pace    PT Home Exercise Plan  As prescribed    Consulted and Agree with Plan of Care  Patient       Patient will benefit from skilled therapeutic intervention in order to improve the following deficits and impairments:  Abnormal gait, Decreased activity tolerance, Decreased balance, Decreased strength, Difficulty walking, Obesity, Decreased coordination  Visit Diagnosis: Difficulty in walking, not elsewhere classified  Muscle weakness (generalized)     Problem List Patient Active Problem List   Diagnosis Date Noted  . Cough 03/20/2015  . Bronchitis, chronic obstructive w acute bronchitis ([place]) 03/20/2015  . Umbilical hernia without obstruction and without gangrene 01/12/2015  . Ventral hernia without obstruction or gangrene 01/02/2015  Janna Arch, PT, DPT    Janna Arch 04/03/2017, 11:14 AM  [place] MAIN [facility] SERVICES [address] [place], [place], [address] Phone: [phone]   Fax:  [phone]  Name: Kylo Gavin. MRN: [id number] Date of Birth: 02-14-1941

## 2017-04-10 ENCOUNTER — Ambulatory Visit: Payer: PPO

## 2017-04-10 DIAGNOSIS — M6281 Muscle weakness (generalized): Secondary | ICD-10-CM

## 2017-04-10 DIAGNOSIS — R262 Difficulty in walking, not elsewhere classified: Secondary | ICD-10-CM | POA: Diagnosis not present

## 2017-04-10 DIAGNOSIS — R269 Unspecified abnormalities of gait and mobility: Secondary | ICD-10-CM

## 2017-04-10 NOTE — Patient Instructions (Signed)
Nustep progression:  Minutes Arms/ Legs Resistance/ RPM  1-5 WARMUP Both  Lvl 1 RPM>60  6-8 Arms  Lvl 3 RPM   8-10 Legs Level 8  10-15  Both  Level 5 RPM >60  15-16 Legs Level 3 RPM>80  16-17 Arms Level 1 RPM>60  17-19 Both Level 5  19-20 Legs Level 4 RPM>80  20-22 Both Level 5  22-23 Legs Level 5 RPM>80  23-25 Both Level 5  25-30 Cooldown Both Level 2 RPM>60    Arms= ARMS ONLY, keep feet flat on floor if you can, maintain upright posture KEEP AT MEDIUM LEVEL. STOP IF THERE IS PAIN

## 2017-04-10 NOTE — Therapy (Signed)
[place] MAIN [facility] SERVICES [address] [place], [place], [address] Phone: [phone]   Fax:  [phone]  Physical Therapy Treatment  Patient Details  Name: Daniel Eaton. MRN: [id number] Date of Birth: [birth year] Referring Provider: Dr. Ouida Sills   Encounter Date: 04/10/2017  PT End of Session - 04/10/17 1055    Visit Number  10    Number of Visits  64    Date for PT Re-Evaluation  05/22/17    PT Start Time  1027    PT Stop Time  1112    PT Time Calculation (min)  45 min    Equipment Utilized During Treatment  Gait belt min guard to S prn    Activity Tolerance  Patient tolerated treatment well;Patient limited by fatigue    Behavior During Therapy  [facility] for tasks assessed/performed;Anxious       Past Medical History:  Diagnosis Date  . A-fib ([place])   . CHF (congestive heart failure) ([place])   . COPD (chronic obstructive pulmonary disease) ([place])   . Difficult intubation   . Hyperlipidemia   . Hypertension   . Hypothyroidism   . Kidney disease   . Sleep apnea   . Stroke ([place])   . Thyroid disease     Past Surgical History:  Procedure Laterality Date  . CARDIAC CATHETERIZATION    . CATARACT EXTRACTION W/ INTRAOCULAR LENS  IMPLANT, BILATERAL    . COLONOSCOPY  2008  . HERNIA REPAIR     bilateral inguinal hernia/ [place]  . HERNIA REPAIR  02/02/2015   18 x 28 cm ventral light mesh placed laparoscopically.  . TONSILLECTOMY    . UMBILICAL HERNIA REPAIR N/A 02/02/2015   Procedure: HERNIA REPAIR UMBILICAL ADULT;  Surgeon: Robert Bellow, MD;  Location: [facility] ORS;  Service: General;  Laterality: N/A;  . VENTRAL HERNIA REPAIR N/A 02/02/2015   Procedure: LAPAROSCOPIC VENTRAL HERNIA;  Surgeon: Robert Bellow, MD;  Location: [facility] ORS;  Service: General;  Laterality: N/A;  . vp shunt placement  1979    There were no vitals filed for this visit.  Subjective Assessment - 04/10/17 1052    Subjective  Patient compliant with HEP,  feeling good today. Excited to learn program for gym between sessions.     Pertinent History  Pt reports a history of rapid onset balance difficulty since 2014. He reports that the initial episode occurred due to improper use of diuretic with hypokalemia. One year later he had another episode with a fall. He was sent to ENT and had a VNG study which was normal. ENT also ordered a head CT and MRI. MRI showed chronic R MCA infarct. Pt comes for [employer] 2-3x/wk. He has completed pulmonary rehab at [facility] twice. Pt denies dizziness or vertigo but reports difficulty with his balance     Limitations  Walking    Patient Stated Goals  Improve balance so that he only needs cane or no assistive device    Currently in Pain?  No/denies       Nustep progression:  Minutes Arms/ Legs Resistance/ RPM (SPM)  1-5 WARMUP Both  Lvl 1 RPM>60  5-7 Arms  Lvl 3  7-10 Legs Level 8   10-15  Both  Level 5 RPM >60  15-16 Legs Level 3 RPM>80  16-17 Arms Level 1 RPM>60  17-19 Both Level 5  19-20 Legs Level 4 RPM>80  20-22 Both Level 5  22-23 Legs Level 5 RPM>80  23-25 Both  Level 5  25-30 Cooldown Both Level 2 RPM>60    Arms= ARMS ONLY, keep feet flat on floor if you can, maintain upright posture  Cues on breathing. Using modified exertion scale to judge level of difficulty per level.   altering Levels of arms only to lvl 3 to keep in medium range. Cues for upright posture with just UE.   Altering leg only level from lvl 10 to lvl 8 to keep exertion in medium range.   Altered leg and arms combo between strength and cardiovascular section to 5 to allow RPM to 60   altered arms only to lvl 1 for cardiovascular portion due to challenge keeping PRM above 60.  Patient performed functional endurance session focused on workouts between session     Patient reports fatigue at end of session.  Ambulate 96 ft with cane and CGA , 4 stops for breathing.   STS from lower surface requiring multiple attempts before  performance due to LE fatigue with utilization of BUE.    Pt. response to medical necessity: Patient will continue to benefit form skilled physical therapy to improve strength and balance.                 PT Education - 04/10/17 1053    Education provided  Yes    Education Details  gym program for between sessions.     Person(s) Educated  Patient    Methods  Explanation;Demonstration;Verbal cues;Handout    Comprehension  Verbalized understanding;Returned demonstration       PT Short Term Goals - 03/27/17 1104      PT SHORT TERM GOAL #1   Title  Patient will ambulate 90 ft without cane and without stopping to allow for increase in mobility and quality of life.     Baseline  12/27: ambulate 90 ft with cane and no rest break , 03/13/17 unable to walk with the cane and needs a rollator due to increased left knee pain. 2/11: walk 90 ft with cane and no breaks     Time  2    Period  Weeks    Status  Partially Met        PT Long Term Goals - 03/27/17 1037      PT LONG TERM GOAL #1   Title  Pt will be independent with HEP in order to improve strength and balance in order to decrease fall risk and improve function at home and work.    Baseline  continues to improve    Time  8    Period  Weeks    Status  On-going      PT LONG TERM GOAL #2   Title  Pt will improve BERG to 48 points in order to demonstrate clinically significant improvement in balance.     Baseline  04/19/16: 38/56; 05/24/16: 44/56 06/16/16: 43/56 08/16/16: 47/56 7/31: 49/56; 11/07/16: 42/56 10/24: 45/56 11/21: 48/56    Time  8    Period  Weeks    Status  Achieved      PT LONG TERM GOAL #3   Title  Pt will decrease TUG to below 25 seconds in order to demonstrate decreased fall risk and improved LE strength       Baseline  04/19/16: 36.15 seconds; 05/24/16: 20sec; 06/16/16: 22.07 seconds;     Time  8    Period  Weeks    Status  Achieved      PT LONG TERM GOAL #4   Title  Pt  will increase 10MWT by at least 0.13  m/s in order to demonstrate clinically significant improvement in community ambulation.     Baseline  04/19/16: 0.75 m/s; 05/24/2016 .833; 06/16/16: 11.4s = 0.88 m/s 7/3: 13 m =.43ms, 731: .714 9/4: .43; 11/07/16: 0.74 m/s 11/21: . 67 without AD 12/27: .69 m/s ,03/13/17 . 60 M/sec ; 2/11: .83 m/s     Time  8    Period  Days    Status  Achieved      PT LONG TERM GOAL #5   Title  Pt will decrease TUG to below 20 seconds in order to demonstrate decreased fall risk and improved LE strength       Baseline  04/19/16: 36.15 seconds; 05/24/16: 20sec; 06/16/16: 22.07 seconds; 11/07/16: unable to perform today due to weakness with transfers 11/21: 30 seconds without AD; 03/03/17 Patient is not able to perform sit to stand today from low chair. , not able to assess  2/11: 28 seconds    Time  8    Period  Weeks    Status  Partially Met      PT LONG TERM GOAL #6   Title  Patient will increase dynamic gait index score to >19/24 as to demonstrate reduced fall risk and improved dynamic gait balance for better safety with community/home ambulation.     Baseline  7/3: 14 8/7: 17/24; 11/07/16: 14/24 10/24: 14/24 12/27: 14/24; 03/13/17 14/24 2/11: 14/24    Time  6    Period  Weeks    Status  On-going            Plan - 04/10/17 1101    Clinical Impression Statement  Patient educated on and performed exercise program for gym between sessions to increase cardiovascular strength and capacity for exercise for carryover to functional mobility at home and in the environment. Patient fatigued after session requiring cueing for upright posture and breathing demonstrating program at a medium level. Patient will continue to benefit form skilled physical therapy to improve strength and balance.     Rehab Potential  Good    Clinical Impairments Affecting Rehab Potential  Positive: motivation; Negative: chronic balance dysfunction, chronic CVA    PT Frequency  1x / week    PT Duration  8 weeks    PT Treatment/Interventions   ADLs/Self Care Home Management;Cryotherapy;Moist Heat;Neuromuscular re-education;Gait training;Functional mobility training;Stair training;Therapeutic activities;Therapeutic exercise;Balance training;Manual techniques;Aquatic Therapy;Canalith Repostioning;Electrical Stimulation;Traction;Ultrasound;DME Instruction;Patient/family education;Passive range of motion;Energy conservation;Vestibular    PT Next Visit Plan  standing interventions. walking with cane    PT Home Exercise Plan  As prescribed    Consulted and Agree with Plan of Care  Patient       Patient will benefit from skilled therapeutic intervention in order to improve the following deficits and impairments:  Abnormal gait, Decreased activity tolerance, Decreased balance, Decreased strength, Difficulty walking, Obesity, Decreased coordination  Visit Diagnosis: Difficulty in walking, not elsewhere classified  Muscle weakness (generalized)  Gait difficulty  Muscle weakness     Problem List Patient Active Problem List   Diagnosis Date Noted  . Cough 03/20/2015  . Bronchitis, chronic obstructive w acute bronchitis ([place] 03/20/2015  . Umbilical hernia without obstruction and without gangrene 01/12/2015  . Ventral hernia without obstruction or gangrene 01/02/2015   MJanna Arch PT, DPT   MJanna Arch2/25/2019, 11:13 AM  [place] [facility] [address] [address]Phone: [phone]  Fax:  [phone] Name: IGuerry Covington  Daniel Eaton. MRN: [id number] Date of Birth: 1940-11-23

## 2017-04-15 ENCOUNTER — Other Ambulatory Visit: Payer: Self-pay

## 2017-04-15 ENCOUNTER — Encounter: Payer: Self-pay | Admitting: Gynecology

## 2017-04-15 ENCOUNTER — Ambulatory Visit
Admission: EM | Admit: 2017-04-15 | Discharge: 2017-04-15 | Disposition: A | Discharge status: Home / Self Care | Payer: PPO | Attending: Family Medicine | Admitting: Family Medicine

## 2017-04-15 DIAGNOSIS — L03115 Cellulitis of right lower limb: Secondary | ICD-10-CM | POA: Diagnosis not present

## 2017-04-15 MED ORDER — CEPHALEXIN 500 MG PO CAPS: 500.0000 mg | ORAL_CAPSULE | Freq: Two times a day (BID) | ORAL | 0 refills | Status: DC

## 2017-04-15 NOTE — ED Triage Notes (Signed)
Patient c/o right leg cellulitis x yesterday.

## 2017-04-15 NOTE — ED Provider Notes (Signed)
[facility]    CSN: [id number] Arrival date & time: 04/15/17  5366     History   Chief Complaint Chief Complaint  Patient presents with  . Cellulitis    HPI Daniel Diltz. is a 77 y.o. male.   77 yo male with a c/o right lower leg redness, heat and tenderness to the skin since yesterday. Patient has a h/o venous insufficiency. Denies any fevers, chills, or drainage.    The history is provided by the patient.    Past Medical History:  Diagnosis Date  . A-fib ([place])   . CHF (congestive heart failure) ([place])   . COPD (chronic obstructive pulmonary disease) ([place])   . Difficult intubation   . Hyperlipidemia   . Hypertension   . Hypothyroidism   . Kidney disease   . Sleep apnea   . Stroke ([place])   . Thyroid disease     Patient Active Problem List   Diagnosis Date Noted  . Cough 03/20/2015  . Bronchitis, chronic obstructive w acute bronchitis ([place]) 03/20/2015  . Umbilical hernia without obstruction and without gangrene 01/12/2015  . Ventral hernia without obstruction or gangrene 01/02/2015    Past Surgical History:  Procedure Laterality Date  . CARDIAC CATHETERIZATION    . CATARACT EXTRACTION W/ INTRAOCULAR LENS  IMPLANT, BILATERAL    . COLONOSCOPY  2008  . HERNIA REPAIR     bilateral inguinal hernia/ [place]  . HERNIA REPAIR  02/02/2015   18 x 28 cm ventral light mesh placed laparoscopically.  . TONSILLECTOMY    . UMBILICAL HERNIA REPAIR N/A 02/02/2015   Procedure: HERNIA REPAIR UMBILICAL ADULT;  Surgeon: Robert Bellow, MD;  Location: [facility] ORS;  Service: General;  Laterality: N/A;  . VENTRAL HERNIA REPAIR N/A 02/02/2015   Procedure: LAPAROSCOPIC VENTRAL HERNIA;  Surgeon: Robert Bellow, MD;  Location: [facility] ORS;  Service: General;  Laterality: N/A;  . vp shunt placement  1979       Home Medications    Prior to Admission medications   Medication Sig Start Date End Date Taking? Authorizing Provider  acetaminophen (TYLENOL) 500 MG tablet  Take 2 tablets (1,000 mg total) by mouth every 6 (six) hours. For baseline pain control. 02/02/15  Yes Byrnett, Forest Gleason, MD  amiodarone (PACERONE) 200 MG tablet Take 200 mg by mouth daily.  02/20/14  Yes [provider]  aspirin 81 MG tablet Take 81 mg by mouth daily.   Yes [provider]  dabigatran (PRADAXA) 150 MG CAPS capsule Take 150 mg by mouth 2 (two) times daily.   Yes [provider]  levothyroxine (SYNTHROID, LEVOTHROID) 75 MCG tablet Take 75 mcg by mouth daily.  02/20/14  Yes [provider]  lidocaine (LIDODERM) 5 % Place 1 patch onto the skin daily. 03/01/16  Yes [name], MD  loratadine (CLARITIN) 10 MG tablet Take 10 mg by mouth daily.   Yes [provider]  metolazone (ZAROXOLYN) 2.5 MG tablet Take 2.5 mg by mouth 2 (two) times a week.  02/20/14  Yes [provider]  metoprolol (LOPRESSOR) 100 MG tablet Take 100 mg by mouth 2 (two) times daily.  02/20/14  Yes [provider]  Multiple Vitamin (MULTI-VITAMINS) TABS Take 1 tablet by mouth daily.    Yes [provider]  naproxen sodium (ALEVE) 220 MG tablet Take 2 tablets (440 mg total) by mouth 2 (two) times daily with a meal. 02/02/15  Yes Byrnett, Forest Gleason, MD  Omega-3 Fatty  Acids (OMEGA 3 PO) Take 2 capsules by mouth daily.   Yes [provider]  omeprazole (PRILOSEC) 40 MG capsule Take 40 mg by mouth daily.  02/20/14  Yes [provider]  potassium chloride (K-DUR) 10 MEQ tablet Take 10 mEq by mouth 2 (two) times daily.   Yes [provider]  spironolactone (ALDACTONE) 25 MG tablet Take 25 mg by mouth daily.  02/20/14  Yes [provider]  torsemide (DEMADEX) 20 MG tablet Take 20 mg by mouth 2 (two) times daily.  02/20/14  Yes [provider]  cephALEXin (KEFLEX) 500 MG capsule Take 1 capsule (500 mg total) by mouth 2 (two) times daily. 04/15/17   Norval Gable, MD    Family History Family History  Problem Relation  Age of Onset  . Heart disease Mother   . Alcohol abuse Father     Social History Social History   Tobacco Use  . Smoking status: Former Research scientist (life sciences)  . Smokeless tobacco: Never Used  Substance Use Topics  . Alcohol use: No  . Drug use: No     Allergies   Diltiazem hcl   Review of Systems Review of Systems   Physical Exam Triage Vital Signs ED Triage Vitals  Enc Vitals Group     BP 04/15/17 1012 129/73     Pulse Rate 04/15/17 1012 84     Resp 04/15/17 1012 18     Temp 04/15/17 1012 97.8 F (36.6 C)     Temp Source 04/15/17 1012 Oral     SpO2 04/15/17 1012 98 %     Weight 04/15/17 1013 (!) 320 lb (145.2 kg)     Height 04/15/17 1013 5\' 11"  (1.803 m)     Head Circumference --      Peak Flow --      Pain Score 04/15/17 1013 2     Pain Loc --      Pain Edu? --      Excl. in [place]? --    No data found.  Updated Vital Signs BP 129/73 (BP Location: Left Arm)   Pulse 84   Temp 97.8 F (36.6 C) (Oral)   Resp 18   Ht 5\' 11"  (1.803 m)   Wt (!) 320 lb (145.2 kg)   SpO2 98%   BMI 44.63 kg/m   Visual Acuity Right Eye Distance:   Left Eye Distance:   Bilateral Distance:    Right Eye Near:   Left Eye Near:    Bilateral Near:     Physical Exam  Constitutional: He appears well-developed and well-nourished. No distress.  HENT:  Right Ear: Tympanic membrane and ear canal normal.  Left Ear: Tympanic membrane and ear canal normal.  Mouth/Throat: Uvula is midline and mucous membranes are normal. No tonsillar abscesses.  Musculoskeletal:       Right lower leg: He exhibits edema (as well as blanchable erythema, warmth and tenderness to palpation).  Neurological: He is alert.  Skin: Skin is warm and dry. No rash noted. He is not diaphoretic. There is erythema.  Nursing note and vitals reviewed.    UC Treatments / Results  Labs (all labs ordered are listed, but only abnormal results are displayed) Labs Reviewed - No data to display  EKG  EKG Interpretation None        Radiology No results found.  Procedures Procedures (including critical care time)  Medications Ordered in UC Medications - No data to display   Initial Impression / Assessment and Plan /  UC Course  I have reviewed the triage vital signs and the nursing notes.  Pertinent labs & imaging results that were available during my care of the patient were reviewed by me and considered in my medical decision making (see chart for details).       Final Clinical Impressions(s) / UC Diagnoses   Final diagnoses:  Cellulitis of leg, right    ED Discharge Orders        Ordered    cephALEXin (KEFLEX) 500 MG capsule  2 times daily     04/15/17 1034     1. diagnosis reviewed with patient 2. rx as per orders above; reviewed possible side effects, interactions, risks and benefits  3. Recommend supportive treatment with warm compresses to affected area 4. Follow-up prn if symptoms worsen or don't improve  Controlled Substance Prescriptions [place] Controlled Substance Registry consulted? Not Applicable   Norval Gable, MD 04/15/17 1131

## 2017-04-17 ENCOUNTER — Telehealth: Payer: Self-pay | Admitting: *Deleted

## 2017-04-17 NOTE — Telephone Encounter (Signed)
PAtient called requesting information on when to return to normal physical activity. Advised patient to complete coarse of antibiotic and if he is no longer having symptoms to continue with physical therapy. Advised patient to follow up with PCP or [facility] if symptoms persist or become worse. Patient confirmed understanding of information.

## 2017-04-19 ENCOUNTER — Ambulatory Visit: Payer: PPO | Attending: Internal Medicine

## 2017-04-26 ENCOUNTER — Ambulatory Visit: Payer: PPO

## 2017-04-26 DIAGNOSIS — G4733 Obstructive sleep apnea (adult) (pediatric): Secondary | ICD-10-CM | POA: Diagnosis not present

## 2017-04-27 DIAGNOSIS — M25512 Pain in left shoulder: Secondary | ICD-10-CM | POA: Diagnosis not present

## 2017-05-01 ENCOUNTER — Other Ambulatory Visit: Payer: Self-pay | Admitting: Orthopedic Surgery

## 2017-05-01 ENCOUNTER — Other Ambulatory Visit: Payer: Self-pay | Admitting: Sports Medicine

## 2017-05-01 DIAGNOSIS — M25512 Pain in left shoulder: Principal | ICD-10-CM

## 2017-05-01 DIAGNOSIS — G8929 Other chronic pain: Secondary | ICD-10-CM

## 2017-05-03 ENCOUNTER — Ambulatory Visit: Payer: PPO

## 2017-05-10 ENCOUNTER — Ambulatory Visit: Payer: PPO

## 2017-05-10 DIAGNOSIS — L97512 Non-pressure chronic ulcer of other part of right foot with fat layer exposed: Secondary | ICD-10-CM | POA: Diagnosis not present

## 2017-05-10 DIAGNOSIS — B351 Tinea unguium: Secondary | ICD-10-CM | POA: Diagnosis not present

## 2017-05-10 DIAGNOSIS — M79675 Pain in left toe(s): Secondary | ICD-10-CM | POA: Diagnosis not present

## 2017-05-10 DIAGNOSIS — M79674 Pain in right toe(s): Secondary | ICD-10-CM | POA: Diagnosis not present

## 2017-05-11 ENCOUNTER — Ambulatory Visit
Admission: RE | Admit: 2017-05-11 | Discharge: 2017-05-11 | Disposition: A | Discharge status: Home / Self Care | Payer: PPO | Source: Ambulatory Visit | Attending: Sports Medicine | Admitting: Sports Medicine

## 2017-05-11 DIAGNOSIS — M75102 Unspecified rotator cuff tear or rupture of left shoulder, not specified as traumatic: Secondary | ICD-10-CM | POA: Insufficient documentation

## 2017-05-11 DIAGNOSIS — M19012 Primary osteoarthritis, left shoulder: Secondary | ICD-10-CM | POA: Diagnosis not present

## 2017-05-11 DIAGNOSIS — M75122 Complete rotator cuff tear or rupture of left shoulder, not specified as traumatic: Secondary | ICD-10-CM | POA: Diagnosis not present

## 2017-05-11 DIAGNOSIS — M25512 Pain in left shoulder: Secondary | ICD-10-CM | POA: Insufficient documentation

## 2017-05-11 DIAGNOSIS — G8929 Other chronic pain: Secondary | ICD-10-CM | POA: Insufficient documentation

## 2017-05-15 ENCOUNTER — Ambulatory Visit: Payer: PPO | Attending: Internal Medicine

## 2017-05-15 DIAGNOSIS — R269 Unspecified abnormalities of gait and mobility: Secondary | ICD-10-CM | POA: Diagnosis not present

## 2017-05-15 DIAGNOSIS — M6281 Muscle weakness (generalized): Secondary | ICD-10-CM

## 2017-05-15 DIAGNOSIS — R262 Difficulty in walking, not elsewhere classified: Secondary | ICD-10-CM | POA: Diagnosis not present

## 2017-05-15 NOTE — Therapy (Signed)
[place] [facility] SERVICES [address] [place], [place], [address] Phone: [phone]   Fax:  [phone]  Physical Therapy Treatment  Patient Details  Name: Daniel Eaton. MRN: [id number] Date of Birth: 11/30/1940 No data recorded  Encounter Date: 05/15/2017  PT End of Session - 05/15/17 1410    Visit Number  59    Number of Visits  64    Date for PT Re-Evaluation  05/22/17    PT Start Time  1350    PT Stop Time  1430    PT Time Calculation (min)  40 min    Equipment Utilized During Treatment  Gait belt min guard to S prn    Activity Tolerance  Patient tolerated treatment well;Patient limited by fatigue    Behavior During Therapy  WFL for tasks assessed/performed;Anxious       Past Medical History:  Diagnosis Date  . A-fib ([place])   . CHF (congestive heart failure) ([place])   . COPD (chronic obstructive pulmonary disease) ([place])   . Difficult intubation   . Hyperlipidemia   . Hypertension   . Hypothyroidism   . Kidney disease   . Sleep apnea   . Stroke ([place])   . Thyroid disease     Past Surgical History:  Procedure Laterality Date  . CARDIAC CATHETERIZATION    . CATARACT EXTRACTION W/ INTRAOCULAR LENS  IMPLANT, BILATERAL    . COLONOSCOPY  2008  . HERNIA REPAIR     bilateral inguinal hernia/ [place]  . HERNIA REPAIR  02/02/2015   18 x 28 cm ventral light mesh placed laparoscopically.  . TONSILLECTOMY    . UMBILICAL HERNIA REPAIR N/A 02/02/2015   Procedure: HERNIA REPAIR UMBILICAL ADULT;  Surgeon: Robert Bellow, MD;  Location: [facility] ORS;  Service: General;  Laterality: N/A;  . VENTRAL HERNIA REPAIR N/A 02/02/2015   Procedure: LAPAROSCOPIC VENTRAL HERNIA;  Surgeon: Robert Bellow, MD;  Location: [facility] ORS;  Service: General;  Laterality: N/A;  . vp shunt placement  1979    There were no vitals filed for this visit.  Subjective Assessment - 05/15/17 1358    Subjective  Since last session patient lifted heavy steak  boxes, went to chiropractor and chiropractor sent him to the doctor who took MRI. MRI findings show rotator cuff tear of supraspinatus and infraspinatus. Patient reports that each day is better with more movement in L arm.  Patient also had diagnosis of cellulitis in LE's which is now cleared.     Pertinent History  Pt reports a history of rapid onset balance difficulty since 2014. He reports that the initial episode occurred due to improper use of diuretic with hypokalemia. One year later he had another episode with a fall. He was sent to ENT and had a VNG study which was normal. ENT also ordered a head CT and MRI. MRI showed chronic R MCA infarct. Pt comes for [employer] 2-3x/wk. He has completed pulmonary rehab at [facility] twice. Pt denies dizziness or vertigo but reports difficulty with his balance     Limitations  Walking    Patient Stated Goals  Improve balance so that he only needs cane or no assistive device    Currently in Pain?  Yes    Pain Score  2     Pain Location  Shoulder    Pain Orientation  Left    Pain Descriptors / Indicators  Aching;Sore    Pain Type  Acute pain  Pain Onset  1 to 4 weeks ago    Pain Frequency  Intermittent     Since last session patient lifted heavy steak boxes, went to chiropractor and chiropractor sent him to the doctor who took MRI. MRI findings show rotator cuff tear of supraspinatus and infraspinatus. Patient reports that each day is better with more movement in L arm.  Patient also had diagnosis of cellulitis in LE's which is now cleared.   Standing hip flexion 10x each leg RUE support; BLE   Standing hip extension 10x each leg RUE support : BLE  Standing hip marches 10x each leg, RUE support; BLE   Standing hip abduction 10x each leg, RUE support, BLE   Sit to stand from raised surfaces in // bars  Airex pad: static balance in // bars 3x45 seconds   Heel raises 15x   Patient required frequent seated rest breaks due to LE fatigue and limited  ability to utilize UE support                         PT Education - 05/15/17 1405    Education provided  Yes    Education Details  POC, return to physical therapy, re-assess goals next session.     Person(s) Educated  Patient    Methods  Explanation;Demonstration;Verbal cues    Comprehension  Verbalized understanding;Returned demonstration       PT Short Term Goals - 03/27/17 1104      PT SHORT TERM GOAL #1   Title  Patient will ambulate 90 ft without cane and without stopping to allow for increase in mobility and quality of life.     Baseline  12/27: ambulate 90 ft with cane and no rest break , 03/13/17 unable to walk with the cane and needs a rollator due to increased left knee pain. 2/11: walk 90 ft with cane and no breaks     Time  2    Period  Weeks    Status  Partially Met        PT Long Term Goals - 03/27/17 1037      PT LONG TERM GOAL #1   Title  Pt will be independent with HEP in order to improve strength and balance in order to decrease fall risk and improve function at home and work.    Baseline  continues to improve    Time  8    Period  Weeks    Status  On-going      PT LONG TERM GOAL #2   Title  Pt will improve BERG to 48 points in order to demonstrate clinically significant improvement in balance.     Baseline  04/19/16: 38/56; 05/24/16: 44/56 06/16/16: 43/56 08/16/16: 47/56 7/31: 49/56; 11/07/16: 42/56 10/24: 45/56 11/21: 48/56    Time  8    Period  Weeks    Status  Achieved      PT LONG TERM GOAL #3   Title  Pt will decrease TUG to below 25 seconds in order to demonstrate decreased fall risk and improved LE strength       Baseline  04/19/16: 36.15 seconds; 05/24/16: 20sec; 06/16/16: 22.07 seconds;     Time  8    Period  Weeks    Status  Achieved      PT LONG TERM GOAL #4   Title  Pt will increase 10MWT by at least 0.13 m/s in order to demonstrate clinically significant improvement in community  ambulation.     Baseline  04/19/16: 0.75 m/s;  05/24/2016 .833; 06/16/16: 11.4s = 0.88 m/s 7/3: 13 m =.37ms, 731: .714 9/4: .43; 11/07/16: 0.74 m/s 11/21: . 67 without AD 12/27: .69 m/s ,03/13/17 . 60 M/sec ; 2/11: .83 m/s     Time  8    Period  Days    Status  Achieved      PT LONG TERM GOAL #5   Title  Pt will decrease TUG to below 20 seconds in order to demonstrate decreased fall risk and improved LE strength       Baseline  04/19/16: 36.15 seconds; 05/24/16: 20sec; 06/16/16: 22.07 seconds; 11/07/16: unable to perform today due to weakness with transfers 11/21: 30 seconds without AD; 03/03/17 Patient is not able to perform sit to stand today from low chair. , not able to assess  2/11: 28 seconds    Time  8    Period  Weeks    Status  Partially Met      PT LONG TERM GOAL #6   Title  Patient will increase dynamic gait index score to >19/24 as to demonstrate reduced fall risk and improved dynamic gait balance for better safety with community/home ambulation.     Baseline  7/3: 14 8/7: 17/24; 11/07/16: 14/24 10/24: 14/24 12/27: 14/24; 03/13/17 14/24 2/11: 14/24    Time  6    Period  Weeks    Status  On-going            Plan - 05/15/17 1418    Clinical Impression Statement  Patient returned to therapy after one month hiatus due to combined incidence of cellulitis in LE's and torn rotator cuff in L shoulder. Patient re-introduced into standing interventions and balance. Patient challenged by standing duration due to LE tolerance and limited ability to utilize UE support. Patient will continue to benefit from skilled physical therapy to improve strength and balance.     Rehab Potential  Good    Clinical Impairments Affecting Rehab Potential  Positive: motivation; Negative: chronic balance dysfunction, chronic CVA    PT Frequency  1x / week    PT Duration  8 weeks    PT Treatment/Interventions  ADLs/Self Care Home Management;Cryotherapy;Moist Heat;Neuromuscular re-education;Gait training;Functional mobility training;Stair training;Therapeutic  activities;Therapeutic exercise;Balance training;Manual techniques;Aquatic Therapy;Canalith Repostioning;Electrical Stimulation;Traction;Ultrasound;DME Instruction;Patient/family education;Passive range of motion;Energy conservation;Vestibular    PT Next Visit Plan  standing interventions. walking with cane    PT Home Exercise Plan  As prescribed    Consulted and Agree with Plan of Care  Patient       Patient will benefit from skilled therapeutic intervention in order to improve the following deficits and impairments:  Abnormal gait, Decreased activity tolerance, Decreased balance, Decreased strength, Difficulty walking, Obesity, Decreased coordination  Visit Diagnosis: Difficulty in walking, not elsewhere classified  Muscle weakness (generalized)  Gait difficulty  Muscle weakness     Problem List Patient Active Problem List   Diagnosis Date Noted  . Cough 03/20/2015  . Bronchitis, chronic obstructive w acute bronchitis ([place] 03/20/2015  . Umbilical hernia without obstruction and without gangrene 01/12/2015  . Ventral hernia without obstruction or gangrene 01/02/2015   MJanna Arch PT, DPT   MJanna Arch4/02/2017, 2:30 PM  [facility] [address] [address]Phone: [phone]  Fax:  [phone] Name: IKoty Anctil MRN: [id number] of Birth: [birth year]

## 2017-05-16 DIAGNOSIS — M7542 Impingement syndrome of left shoulder: Secondary | ICD-10-CM | POA: Diagnosis not present

## 2017-05-22 DIAGNOSIS — G4733 Obstructive sleep apnea (adult) (pediatric): Secondary | ICD-10-CM | POA: Diagnosis not present

## 2017-05-25 ENCOUNTER — Ambulatory Visit: Payer: PPO

## 2017-05-25 DIAGNOSIS — R262 Difficulty in walking, not elsewhere classified: Secondary | ICD-10-CM

## 2017-05-25 DIAGNOSIS — R269 Unspecified abnormalities of gait and mobility: Secondary | ICD-10-CM

## 2017-05-25 DIAGNOSIS — M6281 Muscle weakness (generalized): Secondary | ICD-10-CM

## 2017-05-25 NOTE — Therapy (Signed)
[name] [facility] SERVICES [address] [place], [place], [address] Phone: [phone]   Fax:  [phone]  Physical Therapy Treatment  Patient Details  Name: Daniel Eaton. MRN: [id number] Date of Birth: 22-Mar-1940 No data recorded  Encounter Date: 05/25/2017  PT End of Session - 05/25/17 1425    Visit Number  60    Number of Visits  64    Date for PT Re-Evaluation  07/20/17    PT Start Time  1359    PT Stop Time  1430    PT Time Calculation (min)  31 min    Equipment Utilized During Treatment  Gait belt min guard to S prn    Activity Tolerance  Patient tolerated treatment well;Patient limited by fatigue    Behavior During Therapy  WFL for tasks assessed/performed;Anxious       Past Medical History:  Diagnosis Date  . A-fib ([place])   . CHF (congestive heart failure) ([place])   . COPD (chronic obstructive pulmonary disease) ([place])   . Difficult intubation   . Hyperlipidemia   . Hypertension   . Hypothyroidism   . Kidney disease   . Sleep apnea   . Stroke ([place])   . Thyroid disease     Past Surgical History:  Procedure Laterality Date  . CARDIAC CATHETERIZATION    . CATARACT EXTRACTION W/ INTRAOCULAR LENS  IMPLANT, BILATERAL    . COLONOSCOPY  2008  . HERNIA REPAIR     bilateral inguinal hernia/ [facility]  . HERNIA REPAIR  02/02/2015   18 x 28 cm ventral light mesh placed laparoscopically.  . TONSILLECTOMY    . UMBILICAL HERNIA REPAIR N/A 02/02/2015   Procedure: HERNIA REPAIR UMBILICAL ADULT;  Surgeon: Robert Bellow, MD;  Location: [facility] ORS;  Service: General;  Laterality: N/A;  . VENTRAL HERNIA REPAIR N/A 02/02/2015   Procedure: LAPAROSCOPIC VENTRAL HERNIA;  Surgeon: Robert Bellow, MD;  Location: [facility] ORS;  Service: General;  Laterality: N/A;  . vp shunt placement  1979    There were no vitals filed for this visit.  Subjective Assessment - 05/25/17 1403    Subjective  Patient still recovering from missing over a  month of therapy prior to last week. Bilateral shoulders still painful. Compliant with HEP.     Pertinent History  Pt reports a history of rapid onset balance difficulty since 2014. He reports that the initial episode occurred due to improper use of diuretic with hypokalemia. One year later he had another episode with a fall. He was sent to ENT and had a VNG study which was normal. ENT also ordered a head CT and MRI. MRI showed chronic R MCA infarct. Pt comes for [employer] 2-3x/wk. He has completed pulmonary rehab at [facility] twice. Pt denies dizziness or vertigo but reports difficulty with his balance     Limitations  Walking    Patient Stated Goals  Improve balance so that he only needs cane or no assistive device    Currently in Pain?  Yes    Pain Score  5     Pain Location  Shoulder    Pain Orientation  Right;Left    Pain Descriptors / Indicators  Aching    Pain Type  Acute pain    Pain Onset  1 to 4 weeks ago    Pain Frequency  Intermittent       Ambulate 96 ft with SPC and CGA and no breaks.  TUG: 23 seconds  DGI: 14 / predictive of falls   Stair negotiation: BUE support, step to pattern Ambulate 100 ft total with RW and cues for upright chest/posture.   [facility] PT Assessment - 05/25/17 0001      Dynamic Gait Index   Level Surface  Mild Impairment    Change in Gait Speed  Mild Impairment    Gait with Horizontal Head Turns  Mild Impairment    Gait with Vertical Head Turns  Mild Impairment    Gait and Pivot Turn  Mild Impairment    Step Over Obstacle  Moderate Impairment    Step Around Obstacles  Mild Impairment    Steps  Moderate Impairment    Total Score  14                             PT Short Term Goals - 05/25/17 1411      PT SHORT TERM GOAL #1   Title  Patient will ambulate 90 ft without cane and without stopping to allow for increase in mobility and quality of life.     Baseline  96 ft SPC with no rest breaks     Time  2    Period  Weeks     Status  Partially Met      PT SHORT TERM GOAL #2   Title  Patient will stand from standard height chair on first attempt to allow for improved mobility.     Baseline  requires multiple attempts    Time  2    Period  Weeks    Status  New    Target Date  06/08/17        PT Long Term Goals - 05/25/17 1405      PT LONG TERM GOAL #1   Title  Pt will be independent with HEP in order to improve strength and balance in order to decrease fall risk and improve function at home and work.    Baseline  continues to improve    Time  8    Period  Weeks    Status  On-going      PT LONG TERM GOAL #2   Title  Pt will improve BERG to 48 points in order to demonstrate clinically significant improvement in balance.     Baseline  04/19/16: 38/56; 05/24/16: 44/56 06/16/16: 43/56 08/16/16: 47/56 7/31: 49/56; 11/07/16: 42/56 10/24: 45/56 11/21: 48/56    Time  8    Period  Weeks    Status  Achieved      PT LONG TERM GOAL #3   Title  Pt will decrease TUG to below 25 seconds in order to demonstrate decreased fall risk and improved LE strength       Baseline  04/19/16: 36.15 seconds; 05/24/16: 20sec; 06/16/16: 22.07 seconds;      Time  8    Period  Weeks    Status  Achieved      PT LONG TERM GOAL #4   Title  Pt will increase 10MWT by at least 0.13 m/s in order to demonstrate clinically significant improvement in community ambulation.     Baseline  04/19/16: 0.75 m/s; 05/24/2016 .833; 06/16/16: 11.4s = 0.88 m/s 7/3: 13 m =.54ms, 731: .714 9/4: .43; 11/07/16: 0.74 m/s 11/21: . 67 without AD 12/27: .69 m/s ,03/13/17 . 60 M/sec ; 2/11: .83 m/s     Time  8    Period  Days  Status  Achieved      PT LONG TERM GOAL #5   Title  Pt will decrease TUG to below 20 seconds in order to demonstrate decreased fall risk and improved LE strength       Baseline  04/19/16: 36.15 seconds; 05/24/16: 20sec; 06/16/16: 22.07 seconds; 11/07/16: unable to perform today due to weakness with transfers 11/21: 30 seconds without AD; 03/03/17 Patient is  not able to perform sit to stand today from low chair. , not able to assess  2/11: 28 seconds 4/11: 23 seconds    Time  8    Period  Weeks    Status  Partially Met      PT LONG TERM GOAL #6   Title  Patient will increase dynamic gait index score to >19/24 as to demonstrate reduced fall risk and improved dynamic gait balance for better safety with community/home ambulation.     Baseline  7/3: 14 8/7: 17/24; 11/07/16: 14/24 10/24: 14/24 12/27: 14/24; 03/13/17 14/24 2/11: 14/24    Time  6    Period  Weeks    Status  On-going            Plan - 05/25/17 1430    Clinical Impression Statement  Patient has had over a month of missed sessions due to cellulitis and torn rotator cuff prior to last session. Despite interferences from other health problems patient remains compliant with HEP and continues to progress towards more functional ambulation, balance, and transfers.  TUG: 23 seconds, DGI 14, ambulate 96 ft with SPC and no breaks. Sit to stand from a standard height chair is challenging to patient at this time. Patient will continue to benefit from skilled hysical therapy to improve strength and balance.     Rehab Potential  Good    Clinical Impairments Affecting Rehab Potential  Positive: motivation; Negative: chronic balance dysfunction, chronic CVA    PT Frequency  1x / week    PT Duration  8 weeks    PT Treatment/Interventions  ADLs/Self Care Home Management;Cryotherapy;Moist Heat;Neuromuscular re-education;Gait training;Functional mobility training;Stair training;Therapeutic activities;Therapeutic exercise;Balance training;Manual techniques;Aquatic Therapy;Canalith Repostioning;Electrical Stimulation;Traction;Ultrasound;DME Instruction;Patient/family education;Passive range of motion;Energy conservation;Vestibular    PT Next Visit Plan  standing interventions. walking with cane    PT Home Exercise Plan  As prescribed    Consulted and Agree with Plan of Care  Patient       Patient will  benefit from skilled therapeutic intervention in order to improve the following deficits and impairments:  Abnormal gait, Decreased activity tolerance, Decreased balance, Decreased strength, Difficulty walking, Obesity, Decreased coordination  Visit Diagnosis: Difficulty in walking, not elsewhere classified  Muscle weakness (generalized)  Gait difficulty  Muscle weakness     Problem List Patient Active Problem List   Diagnosis Date Noted  . Cough 03/20/2015  . Bronchitis, chronic obstructive w acute bronchitis ([place]) 03/20/2015  . Umbilical hernia without obstruction and without gangrene 01/12/2015  . Ventral hernia without obstruction or gangrene 01/02/2015   Janna Arch, PT, DPT   05/25/2017, 2:31 PM  [place] MAIN [facility] SERVICES [address] [place], [place], [address] Phone: [phone]   Fax:  [phone]  Name: Daniel [place]. MRN: [id number] Date of Birth: 17-Apr-1940

## 2017-05-27 DIAGNOSIS — G4733 Obstructive sleep apnea (adult) (pediatric): Secondary | ICD-10-CM | POA: Diagnosis not present

## 2017-05-29 DIAGNOSIS — M25612 Stiffness of left shoulder, not elsewhere classified: Secondary | ICD-10-CM | POA: Diagnosis not present

## 2017-05-29 DIAGNOSIS — M25512 Pain in left shoulder: Secondary | ICD-10-CM | POA: Diagnosis not present

## 2017-05-30 ENCOUNTER — Ambulatory Visit: Payer: PPO

## 2017-05-31 DIAGNOSIS — M25612 Stiffness of left shoulder, not elsewhere classified: Secondary | ICD-10-CM | POA: Diagnosis not present

## 2017-05-31 DIAGNOSIS — L97512 Non-pressure chronic ulcer of other part of right foot with fat layer exposed: Secondary | ICD-10-CM | POA: Diagnosis not present

## 2017-05-31 DIAGNOSIS — M25512 Pain in left shoulder: Secondary | ICD-10-CM | POA: Diagnosis not present

## 2017-05-31 DIAGNOSIS — G4733 Obstructive sleep apnea (adult) (pediatric): Secondary | ICD-10-CM | POA: Diagnosis not present

## 2017-06-05 ENCOUNTER — Ambulatory Visit: Payer: PPO

## 2017-06-05 DIAGNOSIS — R262 Difficulty in walking, not elsewhere classified: Secondary | ICD-10-CM | POA: Diagnosis not present

## 2017-06-05 DIAGNOSIS — R269 Unspecified abnormalities of gait and mobility: Secondary | ICD-10-CM

## 2017-06-05 DIAGNOSIS — M6281 Muscle weakness (generalized): Secondary | ICD-10-CM

## 2017-06-05 NOTE — Therapy (Signed)
[place] MAIN [facility] SERVICES [address] [place], [place], [address] Phone: [phone]   Fax:  [phone]  Physical Therapy Treatment  Patient Details  Name: Daniel E[place]. MRN: [id number] Date of Birth: 04/06/1940 No data recorded  Encounter Date: 06/05/2017  PT End of Session - 06/05/17 1314    Visit Number  61    Number of Visits  64    Date for PT Re-Evaluation  07/20/17    PT Start Time  1304    PT Stop Time  1345    PT Time Calculation (min)  41 min    Equipment Utilized During Treatment  Gait belt min guard to S prn    Activity Tolerance  Patient tolerated treatment well;Patient limited by fatigue    Behavior During Therapy  WFL for tasks assessed/performed;Anxious       Past Medical History:  Diagnosis Date  . A-fib ([place])   . CHF (congestive heart failure) ([place])   . COPD (chronic obstructive pulmonary disease) ([place])   . Difficult intubation   . Hyperlipidemia   . Hypertension   . Hypothyroidism   . Kidney disease   . Sleep apnea   . Stroke ([place])   . Thyroid disease     Past Surgical History:  Procedure Laterality Date  . CARDIAC CATHETERIZATION    . CATARACT EXTRACTION W/ INTRAOCULAR LENS  IMPLANT, BILATERAL    . COLONOSCOPY  2008  . HERNIA REPAIR     bilateral inguinal hernia/ [facility]  . HERNIA REPAIR  02/02/2015   18 x 28 cm ventral light mesh placed laparoscopically.  . TONSILLECTOMY    . UMBILICAL HERNIA REPAIR N/A 02/02/2015   Procedure: HERNIA REPAIR UMBILICAL ADULT;  Surgeon: Robert Bellow, MD;  Location: [facility] ORS;  Service: General;  Laterality: N/A;  . VENTRAL HERNIA REPAIR N/A 02/02/2015   Procedure: LAPAROSCOPIC VENTRAL HERNIA;  Surgeon: Robert Bellow, MD;  Location: [facility] ORS;  Service: General;  Laterality: N/A;  . vp shunt placement  1979    There were no vitals filed for this visit.  Subjective Assessment - 06/05/17 1309    Subjective  Patient reports compliance with HEP (seated)  everyday but not the sit stand. Is challenged by sit stand from low surface.     Pertinent History  Pt reports a history of rapid onset balance difficulty since 2014. He reports that the initial episode occurred due to improper use of diuretic with hypokalemia. One year later he had another episode with a fall. He was sent to ENT and had a VNG study which was normal. ENT also ordered a head CT and MRI. MRI showed chronic R MCA infarct. Pt comes for [employer] 2-3x/wk. He has completed pulmonary rehab at [facility] twice. Pt denies dizziness or vertigo but reports difficulty with his balance     Limitations  Walking    Patient Stated Goals  Improve balance so that he only needs cane or no assistive device    Currently in Pain?  No/denies       Standing marches in // bars 6x lengths of bars  6" step;             Toe taps single UE support 20x, SUE to no UE support              Side toe taps single UE support 15x   Purse lipped breathing with scapular retractions   Ambulate 96 ft with SPC and CGA.  Cues for upright posture and step length x 3 trials  Sit to stand from raised plinth table with practice on step technique and equal weight shift. 8x. Cues for pushing through heels/legs rather than snapping knees into hyperextension.   Seated df with RTB around forefoot 10x each leg ; 15x each leg   Resisted hamstring curl RTB 10x setaed:     Pt. response to medical necessity:  Patient will continue to benefit from skilled physical therapy to improve strength and balance                     PT Education - 06/05/17 1313    Education provided  Yes    Education Details  exercise technique, HEP compliance, sit to stand     Person(s) Educated  Patient    Methods  Explanation;Demonstration    Comprehension  Verbalized understanding;Returned demonstration       PT Short Term Goals - 05/25/17 1411      PT SHORT TERM GOAL #1   Title  Patient will ambulate 90 ft without cane and  without stopping to allow for increase in mobility and quality of life.     Baseline  96 ft SPC with no rest breaks     Time  2    Period  Weeks    Status  Partially Met      PT SHORT TERM GOAL #2   Title  Patient will stand from standard height chair on first attempt to allow for improved mobility.     Baseline  requires multiple attempts    Time  2    Period  Weeks    Status  New    Target Date  06/08/17        PT Long Term Goals - 05/25/17 1405      PT LONG TERM GOAL #1   Title  Pt will be independent with HEP in order to improve strength and balance in order to decrease fall risk and improve function at home and work.    Baseline  continues to improve    Time  8    Period  Weeks    Status  On-going      PT LONG TERM GOAL #2   Title  Pt will improve BERG to 48 points in order to demonstrate clinically significant improvement in balance.     Baseline  04/19/16: 38/56; 05/24/16: 44/56 06/16/16: 43/56 08/16/16: 47/56 7/31: 49/56; 11/07/16: 42/56 10/24: 45/56 11/21: 48/56    Time  8    Period  Weeks    Status  Achieved      PT LONG TERM GOAL #3   Title  Pt will decrease TUG to below 25 seconds in order to demonstrate decreased fall risk and improved LE strength       Baseline  04/19/16: 36.15 seconds; 05/24/16: 20sec; 06/16/16: 22.07 seconds;      Time  8    Period  Weeks    Status  Achieved      PT LONG TERM GOAL #4   Title  Pt will increase 10MWT by at least 0.13 m/s in order to demonstrate clinically significant improvement in community ambulation.     Baseline  04/19/16: 0.75 m/s; 05/24/2016 .833; 06/16/16: 11.4s = 0.88 m/s 7/3: 13 m =.66ms, 731: .714 9/4: .43; 11/07/16: 0.74 m/s 11/21: . 67 without AD 12/27: .69 m/s ,03/13/17 . 60 M/sec ; 2/11: .83 m/s     Time  8  Period  Days    Status  Achieved      PT LONG TERM GOAL #5   Title  Pt will decrease TUG to below 20 seconds in order to demonstrate decreased fall risk and improved LE strength       Baseline  04/19/16: 36.15 seconds;  05/24/16: 20sec; 06/16/16: 22.07 seconds; 11/07/16: unable to perform today due to weakness with transfers 11/21: 30 seconds without AD; 03/03/17 Patient is not able to perform sit to stand today from low chair. , not able to assess  2/11: 28 seconds 4/11: 23 seconds    Time  8    Period  Weeks    Status  Partially Met      PT LONG TERM GOAL #6   Title  Patient will increase dynamic gait index score to >19/24 as to demonstrate reduced fall risk and improved dynamic gait balance for better safety with community/home ambulation.     Baseline  7/3: 14 8/7: 17/24; 11/07/16: 14/24 10/24: 14/24 12/27: 14/24; 03/13/17 14/24 2/11: 14/24    Time  6    Period  Weeks    Status  On-going            Plan - 06/05/17 1328    Clinical Impression Statement  Patient requires seated breaks due to fatigue throughout session. Sit to stands continue to challenge patient due to LE demands requiring patient to practice from higher height. Patient will continue to benefit from skilled physical therapy to improve strength and balance    Rehab Potential  Good    Clinical Impairments Affecting Rehab Potential  Positive: motivation; Negative: chronic balance dysfunction, chronic CVA    PT Frequency  1x / week    PT Duration  8 weeks    PT Treatment/Interventions  ADLs/Self Care Home Management;Cryotherapy;Moist Heat;Neuromuscular re-education;Gait training;Functional mobility training;Stair training;Therapeutic activities;Therapeutic exercise;Balance training;Manual techniques;Aquatic Therapy;Canalith Repostioning;Electrical Stimulation;Traction;Ultrasound;DME Instruction;Patient/family education;Passive range of motion;Energy conservation;Vestibular    PT Next Visit Plan  standing interventions. walking with cane    PT Home Exercise Plan  As prescribed    Consulted and Agree with Plan of Care  Patient       Patient will benefit from skilled therapeutic intervention in order to improve the following deficits and  impairments:  Abnormal gait, Decreased activity tolerance, Decreased balance, Decreased strength, Difficulty walking, Obesity, Decreased coordination  Visit Diagnosis: Difficulty in walking, not elsewhere classified  Muscle weakness (generalized)  Gait difficulty  Muscle weakness     Problem List Patient Active Problem List   Diagnosis Date Noted  . Cough 03/20/2015  . Bronchitis, chronic obstructive w acute bronchitis ([place]) 03/20/2015  . Umbilical hernia without obstruction and without gangrene 01/12/2015  . Ventral hernia without obstruction or gangrene 01/02/2015   Janna Arch, PT, DPT   06/05/2017, 1:48 PM  [place] MAIN [facility] SERVICES [address] [place], [place], [address] Phone: [phone]   Fax:  [phone]  Name: Cashis Rill. MRN: [id number] Date of Birth: November 06, 1940

## 2017-06-06 DIAGNOSIS — R609 Edema, unspecified: Secondary | ICD-10-CM | POA: Diagnosis not present

## 2017-06-06 DIAGNOSIS — M25612 Stiffness of left shoulder, not elsewhere classified: Secondary | ICD-10-CM | POA: Diagnosis not present

## 2017-06-06 DIAGNOSIS — M25512 Pain in left shoulder: Secondary | ICD-10-CM | POA: Diagnosis not present

## 2017-06-08 DIAGNOSIS — M25512 Pain in left shoulder: Secondary | ICD-10-CM | POA: Diagnosis not present

## 2017-06-08 DIAGNOSIS — M25612 Stiffness of left shoulder, not elsewhere classified: Secondary | ICD-10-CM | POA: Diagnosis not present

## 2017-06-08 DIAGNOSIS — R609 Edema, unspecified: Secondary | ICD-10-CM | POA: Diagnosis not present

## 2017-06-12 ENCOUNTER — Ambulatory Visit: Payer: PPO

## 2017-06-12 DIAGNOSIS — R269 Unspecified abnormalities of gait and mobility: Secondary | ICD-10-CM

## 2017-06-12 DIAGNOSIS — R262 Difficulty in walking, not elsewhere classified: Secondary | ICD-10-CM

## 2017-06-12 DIAGNOSIS — M6281 Muscle weakness (generalized): Secondary | ICD-10-CM

## 2017-06-12 NOTE — Therapy (Signed)
[place] MAIN [facility] SERVICES [address] [place], [place], [address] Phone: [phone]   Fax:  [phone]  Physical Therapy Treatment  Patient Details  Name: Daniel Eaton. MRN: [id number] Date of Birth: [birth year] No data recorded  Encounter Date: 06/12/2017  PT End of Session - 06/12/17 1441    Visit Number  62    Number of Visits  64    Date for PT Re-Evaluation  07/20/17    PT Start Time  1102    PT Stop Time  1515    PT Time Calculation (min)  44 min    Equipment Utilized During Treatment  Gait belt min guard to S prn    Activity Tolerance  Patient tolerated treatment well;Patient limited by fatigue    Behavior During Therapy  WFL for tasks assessed/performed;Anxious       Past Medical History:  Diagnosis Date  . A-fib ([place])   . CHF (congestive heart failure) ([place])   . COPD (chronic obstructive pulmonary disease) ([place])   . Difficult intubation   . Hyperlipidemia   . Hypertension   . Hypothyroidism   . Kidney disease   . Sleep apnea   . Stroke ([place])   . Thyroid disease     Past Surgical History:  Procedure Laterality Date  . CARDIAC CATHETERIZATION    . CATARACT EXTRACTION W/ INTRAOCULAR LENS  IMPLANT, BILATERAL    . COLONOSCOPY  2008  . HERNIA REPAIR     bilateral inguinal hernia/ [facility]  . HERNIA REPAIR  02/02/2015   18 x 28 cm ventral light mesh placed laparoscopically.  . TONSILLECTOMY    . UMBILICAL HERNIA REPAIR N/A 02/02/2015   Procedure: HERNIA REPAIR UMBILICAL ADULT;  Surgeon: Robert Bellow, MD;  Location: [facility] ORS;  Service: General;  Laterality: N/A;  . VENTRAL HERNIA REPAIR N/A 02/02/2015   Procedure: LAPAROSCOPIC VENTRAL HERNIA;  Surgeon: Robert Bellow, MD;  Location: [facility] ORS;  Service: General;  Laterality: N/A;  . vp shunt placement  1979    There were no vitals filed for this visit.  Subjective Assessment - 06/12/17 1436    Subjective  Patient reports occasional compliance with HEP  due to challenge with sit to stand HEP. Shoulder is still painful but improving with ROM.     Pertinent History  Pt reports a history of rapid onset balance difficulty since 2014. He reports that the initial episode occurred due to improper use of diuretic with hypokalemia. One year later he had another episode with a fall. He was sent to ENT and had a VNG study which was normal. ENT also ordered a head CT and MRI. MRI showed chronic R MCA infarct. Pt comes for [employer] 2-3x/wk. He has completed pulmonary rehab at [facility] twice. Pt denies dizziness or vertigo but reports difficulty with his balance     Limitations  Walking    Patient Stated Goals  Improve balance so that he only needs cane or no assistive device    Currently in Pain?  Yes    Pain Score  2     Pain Location  Back    Pain Orientation  Lower    Pain Descriptors / Indicators  Aching    Pain Type  Chronic pain    Pain Onset  More than a month ago    Pain Frequency  Intermittent    Aggravating Factors   walking    Multiple Pain Sites  Yes  Pain Score  2    Pain Location  Shoulder    Pain Orientation  Right    Pain Descriptors / Indicators  Aching    Pain Type  Chronic pain    Pain Onset  1 to 4 weeks ago    Pain Frequency  Intermittent    Aggravating Factors   weight bear         Sit to stand from raised plinth table with practice on technique and equal weight shift. 8x. Cues for pushing through heels/legs rather than snapping knees into hyperextension. Two sets.   Seated adduction squeeze 10x with 5 second holds, 2 sets.    Ambulate 102 ft x2 trials (seated rest break in between) with horizontal head turns and SPC with CGA. Fatigued at end requiring additional standing interventions  Seated resisted hamstring curls GTB 2x10   Seated abduction 2x10 GTB one leg at a time, each leg BLE  Seated resisted step GTB 2x10 each LE.      Pt. response to medical necessity:  Patient will continue to benefit from skilled  physical therapy to improve strength and balance                         PT Short Term Goals - 05/25/17 1411      PT SHORT TERM GOAL #1   Title  Patient will ambulate 90 ft without cane and without stopping to allow for increase in mobility and quality of life.     Baseline  96 ft SPC with no rest breaks     Time  2    Period  Weeks    Status  Partially Met      PT SHORT TERM GOAL #2   Title  Patient will stand from standard height chair on first attempt to allow for improved mobility.     Baseline  requires multiple attempts    Time  2    Period  Weeks    Status  New    Target Date  06/08/17        PT Long Term Goals - 05/25/17 1405      PT LONG TERM GOAL #1   Title  Pt will be independent with HEP in order to improve strength and balance in order to decrease fall risk and improve function at home and work.    Baseline  continues to improve    Time  8    Period  Weeks    Status  On-going      PT LONG TERM GOAL #2   Title  Pt will improve BERG to 48 points in order to demonstrate clinically significant improvement in balance.     Baseline  04/19/16: 38/56; 05/24/16: 44/56 06/16/16: 43/56 08/16/16: 47/56 7/31: 49/56; 11/07/16: 42/56 10/24: 45/56 11/21: 48/56    Time  8    Period  Weeks    Status  Achieved      PT LONG TERM GOAL #3   Title  Pt will decrease TUG to below 25 seconds in order to demonstrate decreased fall risk and improved LE strength       Baseline  04/19/16: 36.15 seconds; 05/24/16: 20sec; 06/16/16: 22.07 seconds;      Time  8    Period  Weeks    Status  Achieved      PT LONG TERM GOAL #4   Title  Pt will increase 10MWT by at least 0.13 m/s in order to  demonstrate clinically significant improvement in community ambulation.     Baseline  04/19/16: 0.75 m/s; 05/24/2016 .833; 06/16/16: 11.4s = 0.88 m/s 7/3: 13 m =.26ms, 731: .714 9/4: .43; 11/07/16: 0.74 m/s 11/21: . 67 without AD 12/27: .69 m/s ,03/13/17 . 60 M/sec ; 2/11: .83 m/s     Time  8    Period   Days    Status  Achieved      PT LONG TERM GOAL #5   Title  Pt will decrease TUG to below 20 seconds in order to demonstrate decreased fall risk and improved LE strength       Baseline  04/19/16: 36.15 seconds; 05/24/16: 20sec; 06/16/16: 22.07 seconds; 11/07/16: unable to perform today due to weakness with transfers 11/21: 30 seconds without AD; 03/03/17 Patient is not able to perform sit to stand today from low chair. , not able to assess  2/11: 28 seconds 4/11: 23 seconds    Time  8    Period  Weeks    Status  Partially Met      PT LONG TERM GOAL #6   Title  Patient will increase dynamic gait index score to >19/24 as to demonstrate reduced fall risk and improved dynamic gait balance for better safety with community/home ambulation.     Baseline  7/3: 14 8/7: 17/24; 11/07/16: 14/24 10/24: 14/24 12/27: 14/24; 03/13/17 14/24 2/11: 14/24    Time  6    Period  Weeks    Status  On-going            Plan - 06/12/17 1447    Clinical Impression Statement  Patient continues to be challenged by sit to stand transfer from lower surface, however improving with decreased need for heightened surface. Knee pain induced with repeated sit to stand and determined to be quadriceps insert fatigue. Patient will continue to benefit from skilled physical therapy to improve strength and balance    Rehab Potential  Good    Clinical Impairments Affecting Rehab Potential  Positive: motivation; Negative: chronic balance dysfunction, chronic CVA    PT Frequency  1x / week    PT Duration  8 weeks    PT Treatment/Interventions  ADLs/Self Care Home Management;Cryotherapy;Moist Heat;Neuromuscular re-education;Gait training;Functional mobility training;Stair training;Therapeutic activities;Therapeutic exercise;Balance training;Manual techniques;Aquatic Therapy;Canalith Repostioning;Electrical Stimulation;Traction;Ultrasound;DME Instruction;Patient/family education;Passive range of motion;Energy conservation;Vestibular    PT  Next Visit Plan  standing interventions. walking with cane    PT Home Exercise Plan  As prescribed    Consulted and Agree with Plan of Care  Patient       Patient will benefit from skilled therapeutic intervention in order to improve the following deficits and impairments:  Abnormal gait, Decreased activity tolerance, Decreased balance, Decreased strength, Difficulty walking, Obesity, Decreased coordination  Visit Diagnosis: Difficulty in walking, not elsewhere classified  Muscle weakness (generalized)  Gait difficulty  Muscle weakness     Problem List Patient Active Problem List   Diagnosis Date Noted  . Cough 03/20/2015  . Bronchitis, [place] obstructive w [place]itis [place] 03/20/2015  . Umbilical hernia without obstruction and without gangrene 01/12/2015  . Ventral hernia without obstruction or gangrene 01/02/2015   MJanna Arch PT, DPT   06/12/2017, 3:15 PM  [place] [facility] [address] [place] [address]Phone: [phone]  Fax:  [phone] Name: Daniel Eaton MRN: [id number] of Birth: [birth year]

## 2017-06-13 DIAGNOSIS — M25512 Pain in left shoulder: Secondary | ICD-10-CM | POA: Diagnosis not present

## 2017-06-13 DIAGNOSIS — R609 Edema, unspecified: Secondary | ICD-10-CM | POA: Diagnosis not present

## 2017-06-13 DIAGNOSIS — M25612 Stiffness of left shoulder, not elsewhere classified: Secondary | ICD-10-CM | POA: Diagnosis not present

## 2017-06-15 DIAGNOSIS — M25612 Stiffness of left shoulder, not elsewhere classified: Secondary | ICD-10-CM | POA: Diagnosis not present

## 2017-06-15 DIAGNOSIS — R609 Edema, unspecified: Secondary | ICD-10-CM | POA: Diagnosis not present

## 2017-06-15 DIAGNOSIS — M25512 Pain in left shoulder: Secondary | ICD-10-CM | POA: Diagnosis not present

## 2017-06-20 DIAGNOSIS — M25612 Stiffness of left shoulder, not elsewhere classified: Secondary | ICD-10-CM | POA: Diagnosis not present

## 2017-06-20 DIAGNOSIS — R609 Edema, unspecified: Secondary | ICD-10-CM | POA: Diagnosis not present

## 2017-06-20 DIAGNOSIS — M25512 Pain in left shoulder: Secondary | ICD-10-CM | POA: Diagnosis not present

## 2017-06-21 ENCOUNTER — Ambulatory Visit: Payer: PPO | Attending: Internal Medicine | Admitting: Physical Therapy

## 2017-06-21 ENCOUNTER — Encounter: Payer: Self-pay | Admitting: Physical Therapy

## 2017-06-21 DIAGNOSIS — R262 Difficulty in walking, not elsewhere classified: Secondary | ICD-10-CM

## 2017-06-21 DIAGNOSIS — R269 Unspecified abnormalities of gait and mobility: Secondary | ICD-10-CM

## 2017-06-21 DIAGNOSIS — M6281 Muscle weakness (generalized): Secondary | ICD-10-CM

## 2017-06-21 NOTE — Therapy (Signed)
[place] MAIN [facility] SERVICES [address] [place], [place], [address] Phone: [phone]   Fax:  [phone]  Physical Therapy Treatment  Patient Details  Name: Daniel Eaton. MRN: [id number] Date of Birth: 1940-09-02 No data recorded  Encounter Date: 06/21/2017  PT End of Session - 06/21/17 1433    Visit Number  63    Number of Visits  64    Date for PT Re-Evaluation  07/20/17    PT Start Time  9758    PT Stop Time  1515    PT Time Calculation (min)  41 min    Equipment Utilized During Treatment  Gait belt min guard to S prn    Activity Tolerance  Patient tolerated treatment well;Patient limited by fatigue    Behavior During Therapy  WFL for tasks assessed/performed;Anxious       Past Medical History:  Diagnosis Date  . A-fib ([place])   . CHF (congestive heart failure) ([place])   . COPD (chronic obstructive pulmonary disease) ([place])   . Difficult intubation   . Hyperlipidemia   . Hypertension   . Hypothyroidism   . Kidney disease   . Sleep apnea   . Stroke ([place])   . Thyroid disease     Past Surgical History:  Procedure Laterality Date  . CARDIAC CATHETERIZATION    . CATARACT EXTRACTION W/ INTRAOCULAR LENS  IMPLANT, BILATERAL    . COLONOSCOPY  2008  . HERNIA REPAIR     bilateral inguinal hernia/ [place]  . HERNIA REPAIR  02/02/2015   18 x 28 cm ventral light mesh placed laparoscopically.  . TONSILLECTOMY    . UMBILICAL HERNIA REPAIR N/A 02/02/2015   Procedure: HERNIA REPAIR UMBILICAL ADULT;  Surgeon: Robert Bellow, MD;  Location: [facility] ORS;  Service: General;  Laterality: N/A;  . VENTRAL HERNIA REPAIR N/A 02/02/2015   Procedure: LAPAROSCOPIC VENTRAL HERNIA;  Surgeon: Robert Bellow, MD;  Location: [facility] ORS;  Service: General;  Laterality: N/A;  . vp shunt placement  1979    There were no vitals filed for this visit.  Subjective Assessment - 06/21/17 1444    Subjective  Pt reports he is doing well this date.  His L  shoulder is very sore after PT yesterday and working more on reaching activities in PT.      Pertinent History  Pt reports a history of rapid onset balance difficulty since 2014. He reports that the initial episode occurred due to improper use of diuretic with hypokalemia. One year later he had another episode with a fall. He was sent to ENT and had a VNG study which was normal. ENT also ordered a head CT and MRI. MRI showed chronic R MCA infarct. Pt comes for [employer] 2-3x/wk. He has completed pulmonary rehab at [facility] twice. Pt denies dizziness or vertigo but reports difficulty with his balance     Limitations  Walking    Patient Stated Goals  Improve balance so that he only needs cane or no assistive device    Currently in Pain?  Yes    Pain Score  3     Pain Location  Shoulder    Pain Orientation  Left    Pain Descriptors / Indicators  Aching    Pain Type  Chronic pain    Pain Onset  More than a month ago       TREATMENT  BP 137/78, SpO2 96%, pulse 66  Sit to stand from slightly raised  mat table with practice on technique and equal weight shift. 5x. Cues for controlled descent until all the way sitting as pt originally demonstrating poor eccentric control at end of stand>sit.    Seated adduction squeeze 10x with 5 second holds.  Ambulate 100 ft x2 trials (seated rest break in between) with horizontal head turns and SPC (x40 ft) with CGA and x160 ft without AD.   Seated isometric hip abduction with belt around knees with 10 second holds x10                        PT Education - 06/21/17 1432    Education provided  Yes    Education Details  Exercise technique    Person(s) Educated  Patient    Methods  Explanation;Demonstration;Verbal cues    Comprehension  Verbalized understanding;Returned demonstration;Verbal cues required;Need further instruction       PT Short Term Goals - 05/25/17 1411      PT SHORT TERM GOAL #1   Title  Patient will ambulate 90 ft  without cane and without stopping to allow for increase in mobility and quality of life.     Baseline  96 ft SPC with no rest breaks     Time  2    Period  Weeks    Status  Partially Met      PT SHORT TERM GOAL #2   Title  Patient will stand from standard height chair on first attempt to allow for improved mobility.     Baseline  requires multiple attempts    Time  2    Period  Weeks    Status  New    Target Date  06/08/17        PT Long Term Goals - 05/25/17 1405      PT LONG TERM GOAL #1   Title  Pt will be independent with HEP in order to improve strength and balance in order to decrease fall risk and improve function at home and work.    Baseline  continues to improve    Time  8    Period  Weeks    Status  On-going      PT LONG TERM GOAL #2   Title  Pt will improve BERG to 48 points in order to demonstrate clinically significant improvement in balance.     Baseline  04/19/16: 38/56; 05/24/16: 44/56 06/16/16: 43/56 08/16/16: 47/56 7/31: 49/56; 11/07/16: 42/56 10/24: 45/56 11/21: 48/56    Time  8    Period  Weeks    Status  Achieved      PT LONG TERM GOAL #3   Title  Pt will decrease TUG to below 25 seconds in order to demonstrate decreased fall risk and improved LE strength       Baseline  04/19/16: 36.15 seconds; 05/24/16: 20sec; 06/16/16: 22.07 seconds;      Time  8    Period  Weeks    Status  Achieved      PT LONG TERM GOAL #4   Title  Pt will increase 10MWT by at least 0.13 m/s in order to demonstrate clinically significant improvement in community ambulation.     Baseline  04/19/16: 0.75 m/s; 05/24/2016 .833; 06/16/16: 11.4s = 0.88 m/s 7/3: 13 m =.75ms, 731: .714 9/4: .43; 11/07/16: 0.74 m/s 11/21: . 67 without AD 12/27: .69 m/s ,03/13/17 . 60 M/sec ; 2/11: .83 m/s     Time  8  Period  Days    Status  Achieved      PT LONG TERM GOAL #5   Title  Pt will decrease TUG to below 20 seconds in order to demonstrate decreased fall risk and improved LE strength       Baseline  04/19/16:  36.15 seconds; 05/24/16: 20sec; 06/16/16: 22.07 seconds; 11/07/16: unable to perform today due to weakness with transfers 11/21: 30 seconds without AD; 03/03/17 Patient is not able to perform sit to stand today from low chair. , not able to assess  2/11: 28 seconds 4/11: 23 seconds    Time  8    Period  Weeks    Status  Partially Met      PT LONG TERM GOAL #6   Title  Patient will increase dynamic gait index score to >19/24 as to demonstrate reduced fall risk and improved dynamic gait balance for better safety with community/home ambulation.     Baseline  7/3: 14 8/7: 17/24; 11/07/16: 14/24 10/24: 14/24 12/27: 14/24; 03/13/17 14/24 2/11: 14/24    Time  6    Period  Weeks    Status  On-going            Plan - 06/21/17 1445    Clinical Impression Statement  Pt initially demonstrating poor eccentric control toward end of stand>sit which improved with verbal cues.  Pt demonstrates significant fatigue after 5 reps of sit<>stand today.  Pt demonstrated increased gait speed and steadiness without use of SPC compared to use of SPC when ambulating in gym.  Pt will benefit from continued skilled PT interventions for improved balance and strength.      Rehab Potential  Good    Clinical Impairments Affecting Rehab Potential  Positive: motivation; Negative: chronic balance dysfunction, chronic CVA    PT Frequency  1x / week    PT Duration  8 weeks    PT Treatment/Interventions  ADLs/Self Care Home Management;Cryotherapy;Moist Heat;Neuromuscular re-education;Gait training;Functional mobility training;Stair training;Therapeutic activities;Therapeutic exercise;Balance training;Manual techniques;Aquatic Therapy;Canalith Repostioning;Electrical Stimulation;Traction;Ultrasound;DME Instruction;Patient/family education;Passive range of motion;Energy conservation;Vestibular    PT Next Visit Plan  standing interventions. walking with cane    PT Home Exercise Plan  As prescribed    Consulted and Agree with Plan of Care   Patient       Patient will benefit from skilled therapeutic intervention in order to improve the following deficits and impairments:  Abnormal gait, Decreased activity tolerance, Decreased balance, Decreased strength, Difficulty walking, Obesity, Decreased coordination  Visit Diagnosis: Difficulty in walking, not elsewhere classified  Muscle weakness (generalized)  Gait difficulty  Muscle weakness     Problem List Patient Active Problem List   Diagnosis Date Noted  . Cough 03/20/2015  . Bronchitis, chronic obstructive w acute bronchitis ([place]) 03/20/2015  . Umbilical hernia without obstruction and without gangrene 01/12/2015  . Ventral hernia without obstruction or gangrene 01/02/2015    Collie Siad PT, DPT 06/21/2017, 3:15 PM  [place] MAIN [facility] SERVICES [address] [place], [place], [address] Phone: [phone]   Fax:  [phone]  Name: Firas Guardado. MRN: [id number] Date of Birth: [birth year]

## 2017-06-22 DIAGNOSIS — M25512 Pain in left shoulder: Secondary | ICD-10-CM | POA: Diagnosis not present

## 2017-06-22 DIAGNOSIS — M25612 Stiffness of left shoulder, not elsewhere classified: Secondary | ICD-10-CM | POA: Diagnosis not present

## 2017-06-22 DIAGNOSIS — R600 Localized edema: Secondary | ICD-10-CM | POA: Diagnosis not present

## 2017-06-26 DIAGNOSIS — G4733 Obstructive sleep apnea (adult) (pediatric): Secondary | ICD-10-CM | POA: Diagnosis not present

## 2017-06-26 DIAGNOSIS — L97511 Non-pressure chronic ulcer of other part of right foot limited to breakdown of skin: Secondary | ICD-10-CM | POA: Diagnosis not present

## 2017-06-28 ENCOUNTER — Ambulatory Visit: Payer: PPO

## 2017-06-28 DIAGNOSIS — R262 Difficulty in walking, not elsewhere classified: Secondary | ICD-10-CM

## 2017-06-28 DIAGNOSIS — M6281 Muscle weakness (generalized): Secondary | ICD-10-CM

## 2017-06-28 NOTE — Therapy (Signed)
[place] MAIN [facility] SERVICES [address] [place], [place], [address] Phone: [phone]   Fax:  [phone]  Physical Therapy Treatment  Patient Details  Name: Daniel Eaton. MRN: [id number] Date of Birth: 08/30/1940 No data recorded  Encounter Date: 06/28/2017  PT End of Session - 06/28/17 1030    Visit Number  64    Number of Visits  71    Date for PT Re-Evaluation  08/09/17    Authorization Type  1/10    PT Start Time  1020    PT Stop Time  1059    PT Time Calculation (min)  39 min    Equipment Utilized During Treatment  Gait belt min guard to S prn    Activity Tolerance  Patient tolerated treatment well;Patient limited by fatigue    Behavior During Therapy  WFL for tasks assessed/performed;Anxious       Past Medical History:  Diagnosis Date  . A-fib ([place])   . CHF (congestive heart failure) ([place])   . COPD (chronic obstructive pulmonary disease) ([place])   . Difficult intubation   . Hyperlipidemia   . Hypertension   . Hypothyroidism   . Kidney disease   . Sleep apnea   . Stroke ([place])   . Thyroid disease     Past Surgical History:  Procedure Laterality Date  . CARDIAC CATHETERIZATION    . CATARACT EXTRACTION W/ INTRAOCULAR LENS  IMPLANT, BILATERAL    . COLONOSCOPY  2008  . HERNIA REPAIR     bilateral inguinal hernia/ [place]  . HERNIA REPAIR  02/02/2015   18 x 28 cm ventral light mesh placed laparoscopically.  . TONSILLECTOMY    . UMBILICAL HERNIA REPAIR N/A 02/02/2015   Procedure: HERNIA REPAIR UMBILICAL ADULT;  Surgeon: Robert Bellow, MD;  Location: [facility] ORS;  Service: General;  Laterality: N/A;  . VENTRAL HERNIA REPAIR N/A 02/02/2015   Procedure: LAPAROSCOPIC VENTRAL HERNIA;  Surgeon: Robert Bellow, MD;  Location: [facility] ORS;  Service: General;  Laterality: N/A;  . vp shunt placement  1979    There were no vitals filed for this visit.  Subjective Assessment - 06/28/17 1025    Subjective  Patient arrived  late to session today. no pain when not moving, knees are tender but not painful. FInished PT for shoulders now.     Pertinent History  Pt reports a history of rapid onset balance difficulty since 2014. He reports that the initial episode occurred due to improper use of diuretic with hypokalemia. One year later he had another episode with a fall. He was sent to ENT and had a VNG study which was normal. ENT also ordered a head CT and MRI. MRI showed chronic R MCA infarct. Pt comes for [employer] 2-3x/wk. He has completed pulmonary rehab at [facility] twice. Pt denies dizziness or vertigo but reports difficulty with his balance     Limitations  Walking    Patient Stated Goals  Improve balance so that he only needs cane or no assistive device    Currently in Pain?  No/denies       Sit to stand from slightly raised mat table with practice on technique and equal weight shift. 5x. Cues for controlled descent until all the way sitting as pt originally demonstrating poor eccentric control at end of stand>sit.     Seated adduction squeeze 10x with 5 second holds.   DGI: 16/21 TUG: 23 seconds without AD  Ambulate holding cane (however  not touching down to ground) 100 ft x 2 trials.   Seated scapular retractions with purse lipped breathing after lap. 20x   Seated abduction GTB 20x   Seated marches GTB 20x focus on upright posture.    Resisted hamstring curl GTB 20x each LE PT hold band  Seated adduction 20x 3 second holds  Pt. response to medical necessity:  Patient will continue to benefit from skilled physical therapy to improve strength and balance    [facility] PT Assessment - 06/28/17 0001      Dynamic Gait Index   Level Surface  Normal    Change in Gait Speed  Normal    Gait with Horizontal Head Turns  Mild Impairment    Gait with Vertical Head Turns  Mild Impairment    Gait and Pivot Turn  Mild Impairment    Step Over Obstacle  Moderate Impairment    Step Around Obstacles  Mild Impairment     Steps  Moderate Impairment    Total Score  16                           PT Education - 06/28/17 1030    Education provided  Yes    Education Details  exercise technique    Person(s) Educated  Patient    Methods  Explanation;Demonstration;Verbal cues    Comprehension  Verbalized understanding;Returned demonstration       PT Short Term Goals - 06/28/17 1031      PT SHORT TERM GOAL #1   Title  Patient will ambulate 90 ft without cane and without stopping to allow for increase in mobility and quality of life.     Baseline  two rest breaks without AD    Time  2    Period  Weeks    Status  Partially Met      PT SHORT TERM GOAL #2   Title  Patient will stand from standard height chair on first attempt to allow for improved mobility.     Baseline  requires x2 attempts     Time  2    Period  Weeks    Status  Partially Met        PT Long Term Goals - 06/28/17 1031      PT LONG TERM GOAL #1   Title  Pt will be independent with HEP in order to improve strength and balance in order to decrease fall risk and improve function at home and work.    Baseline  HEP compliant    Time  8    Period  Weeks    Status  Achieved      PT LONG TERM GOAL #2   Title  Pt will improve BERG to 48 points in order to demonstrate clinically significant improvement in balance.     Baseline  04/19/16: 38/56; 05/24/16: 44/56 06/16/16: 43/56 08/16/16: 47/56 7/31: 49/56; 11/07/16: 42/56 10/24: 45/56 11/21: 48/56    Time  8    Period  Weeks    Status  Achieved      PT LONG TERM GOAL #3   Title  Pt will decrease TUG to below 25 seconds in order to demonstrate decreased fall risk and improved LE strength       Baseline  04/19/16: 36.15 seconds; 05/24/16: 20sec; 06/16/16: 22.07 seconds;      Time  8    Period  Weeks    Status  Achieved  PT LONG TERM GOAL #4   Title  Pt will increase 10MWT by at least 0.13 m/s in order to demonstrate clinically significant improvement in community ambulation.      Baseline  04/19/16: 0.75 m/s; 05/24/2016 .833; 06/16/16: 11.4s = 0.88 m/s 7/3: 13 m =.60ms, 731: .714 9/4: .43; 11/07/16: 0.74 m/s 11/21: . 67 without AD 12/27: .69 m/s ,03/13/17 . 60 M/sec ; 2/11: .83 m/s     Time  8    Period  Days    Status  Achieved      PT LONG TERM GOAL #5   Title  Pt will decrease TUG to below 20 seconds in order to demonstrate decreased fall risk and improved LE strength       Baseline  04/19/16: 36.15 seconds; 05/24/16: 20sec; 06/16/16: 22.07 seconds; 11/07/16: unable to perform today due to weakness with transfers 11/21: 30 seconds without AD; 03/03/17 Patient is not able to perform sit to stand today from low chair. , not able to assess  2/11: 28 seconds 4/11: 23 seconds 5/15: 23 without AD    Time  6    Period  Weeks    Status  Partially Met    Target Date  08/09/17      PT LONG TERM GOAL #6   Title  Patient will increase dynamic gait index score to >19/24 as to demonstrate reduced fall risk and improved dynamic gait balance for better safety with community/home ambulation.     Baseline  7/3: 14 8/7: 17/24; 11/07/16: 14/24 10/24: 14/24 12/27: 14/24; 03/13/17 14/24 2/11: 14/24; 5/15: 16/24     Time  6    Period  Weeks    Status  On-going            Plan - 06/28/17 1049    Clinical Impression Statement  Patient demonstrating improved gait mechanics with decreased need for UE support/ AD support with self correcting for upright posture and increasing arm swing. DGI improved to 16/24. TUG performed at equal time to last trial however this session was performed without AD demonstrating increased independence and ambulatory capacity.  Patient will continue to benefit from skilled physical therapy to improve strength and balance    Rehab Potential  Good    Clinical Impairments Affecting Rehab Potential  Positive: motivation; Negative: chronic balance dysfunction, chronic CVA    PT Frequency  1x / week    PT Duration  6 weeks    PT Treatment/Interventions  ADLs/Self Care  Home Management;Cryotherapy;Moist Heat;Neuromuscular re-education;Gait training;Functional mobility training;Stair training;Therapeutic activities;Therapeutic exercise;Balance training;Manual techniques;Aquatic Therapy;Canalith Repostioning;Electrical Stimulation;Traction;Ultrasound;DME Instruction;Patient/family education;Passive range of motion;Energy conservation;Vestibular    PT Next Visit Plan  walking without AD, STS    PT Home Exercise Plan  As prescribed    Consulted and Agree with Plan of Care  Patient       Patient will benefit from skilled therapeutic intervention in order to improve the following deficits and impairments:  Abnormal gait, Decreased activity tolerance, Decreased balance, Decreased strength, Difficulty walking, Obesity, Decreased coordination  Visit Diagnosis: Difficulty in walking, not elsewhere classified  Muscle weakness (generalized)     Problem List Patient Active Problem List   Diagnosis Date Noted  . Cough 03/20/2015  . [place] 03/20/[place]  . Umbilical hernia without obstruction and without gangrene 01/12/2015  . Ventral hernia without obstruction or gangrene 01/02/2015   MJanna Arch PT, DPT   06/28/2017, 10:59 AM  [facility] [place], [place], [address] Phone: [phone]   Fax:  [phone]  Name: Daniel Eaton. MRN: [id number] Date of Birth: [birth year]

## 2017-07-05 ENCOUNTER — Ambulatory Visit: Payer: PPO

## 2017-07-05 DIAGNOSIS — R262 Difficulty in walking, not elsewhere classified: Secondary | ICD-10-CM

## 2017-07-05 DIAGNOSIS — M6281 Muscle weakness (generalized): Secondary | ICD-10-CM

## 2017-07-05 NOTE — Therapy (Signed)
[place] MAIN [facility] SERVICES [address] [place], [place], [address] Phone: [phone]   Fax:  [phone]  Physical Therapy Treatment  Patient Details  Name: Daniel Eaton. MRN: [id number] Date of Birth: Sep 16, 1940 No data recorded  Encounter Date: 07/05/2017  PT End of Session - 07/05/17 1447    Visit Number  65    Number of Visits  71    Date for PT Re-Evaluation  08/09/17    Authorization Type  2/10    PT Start Time  1430    PT Stop Time  1513    PT Time Calculation (min)  43 min    Equipment Utilized During Treatment  Gait belt min guard to S prn    Activity Tolerance  Patient tolerated treatment well;Patient limited by fatigue    Behavior During Therapy  WFL for tasks assessed/performed;Anxious       Past Medical History:  Diagnosis Date  . A-fib ([place])   . CHF (congestive heart failure) ([place])   . COPD (chronic obstructive pulmonary disease) ([place])   . Difficult intubation   . Hyperlipidemia   . Hypertension   . Hypothyroidism   . Kidney disease   . Sleep apnea   . Stroke ([place])   . Thyroid disease     Past Surgical History:  Procedure Laterality Date  . CARDIAC CATHETERIZATION    . CATARACT EXTRACTION W/ INTRAOCULAR LENS  IMPLANT, BILATERAL    . COLONOSCOPY  2008  . HERNIA REPAIR     bilateral inguinal hernia/ [place]  . HERNIA REPAIR  02/02/2015   18 x 28 cm ventral light mesh placed laparoscopically.  . TONSILLECTOMY    . UMBILICAL HERNIA REPAIR N/A 02/02/2015   Procedure: HERNIA REPAIR UMBILICAL ADULT;  Surgeon: Robert Bellow, MD;  Location: [facility] ORS;  Service: General;  Laterality: N/A;  . VENTRAL HERNIA REPAIR N/A 02/02/2015   Procedure: LAPAROSCOPIC VENTRAL HERNIA;  Surgeon: Robert Bellow, MD;  Location: [facility] ORS;  Service: General;  Laterality: N/A;  . vp shunt placement  1979    There were no vitals filed for this visit.  Subjective Assessment - 07/05/17 1445    Subjective  Patient reports R  and L shoulders sore but no pain. Knees are tender but nonpainful. Has been compliant with HEP.     Pertinent History  Pt reports a history of rapid onset balance difficulty since 2014. He reports that the initial episode occurred due to improper use of diuretic with hypokalemia. One year later he had another episode with a fall. He was sent to ENT and had a VNG study which was normal. ENT also ordered a head CT and MRI. MRI showed chronic R MCA infarct. Pt comes for [employer] 2-3x/wk. He has completed pulmonary rehab at [facility] twice. Pt denies dizziness or vertigo but reports difficulty with his balance     Limitations  Walking    Patient Stated Goals  Improve balance so that he only needs cane or no assistive device    Currently in Pain?  No/denies       Sit to stand from low mat table with practice on technique and equal weight shift. 10x. Able to put hands on knees to push to allow for functional transfer.   Seated adduction squeeze 10x with 5 second holds.; 2 sets  Ambulate 100 ft x2 trials without AD; improved arm swing, first trial required two standing rest breaks  Seated abduction GTB single leg  at a time 10x each leg. 2 sets   Resisted hamstring curls 2x10 each leg GTB   Seated scapular squeezes with purse lipped breathing.     Pt. response to medical necessity: Patient will continue to benefit from skilled physical therapy to improve strength and balance for improved ambulatory mechanics and quality of life.                      PT Education - 07/05/17 1446    Education provided  Yes    Education Details  STS transfer, exercise techique     Person(s) Educated  Patient    Methods  Explanation;Demonstration;Verbal cues    Comprehension  Verbalized understanding;Returned demonstration       PT Short Term Goals - 06/28/17 1031      PT SHORT TERM GOAL #1   Title  Patient will ambulate 90 ft without cane and without stopping to allow for increase in  mobility and quality of life.     Baseline  two rest breaks without AD    Time  2    Period  Weeks    Status  Partially Met      PT SHORT TERM GOAL #2   Title  Patient will stand from standard height chair on first attempt to allow for improved mobility.     Baseline  requires x2 attempts     Time  2    Period  Weeks    Status  Partially Met        PT Long Term Goals - 06/28/17 1031      PT LONG TERM GOAL #1   Title  Pt will be independent with HEP in order to improve strength and balance in order to decrease fall risk and improve function at home and work.    Baseline  HEP compliant    Time  8    Period  Weeks    Status  Achieved      PT LONG TERM GOAL #2   Title  Pt will improve BERG to 48 points in order to demonstrate clinically significant improvement in balance.     Baseline  04/19/16: 38/56; 05/24/16: 44/56 06/16/16: 43/56 08/16/16: 47/56 7/31: 49/56; 11/07/16: 42/56 10/24: 45/56 11/21: 48/56    Time  8    Period  Weeks    Status  Achieved      PT LONG TERM GOAL #3   Title  Pt will decrease TUG to below 25 seconds in order to demonstrate decreased fall risk and improved LE strength       Baseline  04/19/16: 36.15 seconds; 05/24/16: 20sec; 06/16/16: 22.07 seconds;      Time  8    Period  Weeks    Status  Achieved      PT LONG TERM GOAL #4   Title  Pt will increase 10MWT by at least 0.13 m/s in order to demonstrate clinically significant improvement in community ambulation.     Baseline  04/19/16: 0.75 m/s; 05/24/2016 .833; 06/16/16: 11.4s = 0.88 m/s 7/3: 13 m =.42ms, 731: .714 9/4: .43; 11/07/16: 0.74 m/s 11/21: . 67 without AD 12/27: .69 m/s ,03/13/17 . 60 M/sec ; 2/11: .83 m/s     Time  8    Period  Days    Status  Achieved      PT LONG TERM GOAL #5   Title  Pt will decrease TUG to below 20 seconds in order to demonstrate decreased fall  risk and improved LE strength       Baseline  04/19/16: 36.15 seconds; 05/24/16: 20sec; 06/16/16: 22.07 seconds; 11/07/16: unable to perform today due  to weakness with transfers 11/21: 30 seconds without AD; 03/03/17 Patient is not able to perform sit to stand today from low chair. , not able to assess  2/11: 28 seconds 4/11: 23 seconds 5/15: 23 without AD    Time  6    Period  Weeks    Status  Partially Met    Target Date  08/09/17      PT LONG TERM GOAL #6   Title  Patient will increase dynamic gait index score to >19/24 as to demonstrate reduced fall risk and improved dynamic gait balance for better safety with community/home ambulation.     Baseline  7/3: 14 8/7: 17/24; 11/07/16: 14/24 10/24: 14/24 12/27: 14/24; 03/13/17 14/24 2/11: 14/24; 5/15: 16/24     Time  6    Period  Weeks    Status  On-going            Plan - 07/05/17 1453    Clinical Impression Statement  Patient demonstrating improved capacity for prolonged muscle recruitment with transfers from low surface as indicated by ability to perform STS with hands on knees from low surface 10x. Progression of ambulation without AD performed without LOB two trials this session. Patient will continue to benefit from skilled physical therapy to improve strength and balance for improved ambulatory mechanics and quality of life.     Rehab Potential  Good    Clinical Impairments Affecting Rehab Potential  Positive: motivation; Negative: chronic balance dysfunction, chronic CVA    PT Frequency  1x / week    PT Duration  6 weeks    PT Treatment/Interventions  ADLs/Self Care Home Management;Cryotherapy;Moist Heat;Neuromuscular re-education;Gait training;Functional mobility training;Stair training;Therapeutic activities;Therapeutic exercise;Balance training;Manual techniques;Aquatic Therapy;Canalith Repostioning;Electrical Stimulation;Traction;Ultrasound;DME Instruction;Patient/family education;Passive range of motion;Energy conservation;Vestibular    PT Next Visit Plan  walking without AD, STS    PT Home Exercise Plan  As prescribed    Consulted and Agree with Plan of Care  Patient        Patient will benefit from skilled therapeutic intervention in order to improve the following deficits and impairments:  Abnormal gait, Decreased activity tolerance, Decreased balance, Decreased strength, Difficulty walking, Obesity, Decreased coordination  Visit Diagnosis: Difficulty in walking, not elsewhere classified  Muscle weakness (generalized)     Problem List Patient Active Problem List   Diagnosis Date Noted  . Cough 03/20/2015  . Bronchitis, chronic obstructive w acute bronchitis ([place]) 03/20/2015  . Umbilical hernia without obstruction and without gangrene 01/12/2015  . Ventral hernia without obstruction or gangrene 01/02/2015   Janna Arch, PT, DPT   07/05/2017, 3:13 PM  [place] MAIN [facility] SERVICES [address] [place], [place], [address] Phone: [phone]   Fax:  [phone]  Name: Mouhamed Glassco. MRN: [id number] Date of Birth: 02/10/1941

## 2017-07-07 DIAGNOSIS — I129 Hypertensive chronic kidney disease with stage 1 through stage 4 chronic kidney disease, or unspecified chronic kidney disease: Secondary | ICD-10-CM | POA: Diagnosis not present

## 2017-07-07 DIAGNOSIS — E039 Hypothyroidism, unspecified: Secondary | ICD-10-CM | POA: Diagnosis not present

## 2017-07-07 DIAGNOSIS — N183 Chronic kidney disease, stage 3 (moderate): Secondary | ICD-10-CM | POA: Diagnosis not present

## 2017-07-07 DIAGNOSIS — I482 Chronic atrial fibrillation: Secondary | ICD-10-CM | POA: Diagnosis not present

## 2017-07-07 DIAGNOSIS — R7309 Other abnormal glucose: Secondary | ICD-10-CM | POA: Diagnosis not present

## 2017-07-12 ENCOUNTER — Ambulatory Visit: Payer: PPO

## 2017-07-12 DIAGNOSIS — R262 Difficulty in walking, not elsewhere classified: Secondary | ICD-10-CM

## 2017-07-12 DIAGNOSIS — M6281 Muscle weakness (generalized): Secondary | ICD-10-CM

## 2017-07-12 NOTE — Therapy (Signed)
[place] MAIN [facility] SERVICES [address] [place], [place], [address] Phone: [phone]   Fax:  [phone]  Physical Therapy Treatment  Patient Details  Name: Daniel Eaton. MRN: [id number] Date of Birth: 10/11/1940 No data recorded  Encounter Date: 07/12/2017  PT End of Session - 07/12/17 1041    Visit Number  66    Number of Visits  71    Date for PT Re-Evaluation  08/09/17    Authorization Type  3/10    PT Start Time  1030    PT Stop Time  1114    PT Time Calculation (min)  44 min    Equipment Utilized During Treatment  Gait belt min guard to S prn    Activity Tolerance  Patient tolerated treatment well;Patient limited by fatigue    Behavior During Therapy  WFL for tasks assessed/performed;Anxious       Past Medical History:  Diagnosis Date  . A-fib ([place])   . CHF (congestive heart failure) ([place])   . COPD (chronic obstructive pulmonary disease) ([place])   . Difficult intubation   . Hyperlipidemia   . Hypertension   . Hypothyroidism   . Kidney disease   . Sleep apnea   . Stroke ([place])   . Thyroid disease     Past Surgical History:  Procedure Laterality Date  . CARDIAC CATHETERIZATION    . CATARACT EXTRACTION W/ INTRAOCULAR LENS  IMPLANT, BILATERAL    . COLONOSCOPY  2008  . HERNIA REPAIR     bilateral inguinal hernia/ [place]  . HERNIA REPAIR  02/02/2015   18 x 28 cm ventral light mesh placed laparoscopically.  . TONSILLECTOMY    . UMBILICAL HERNIA REPAIR N/A 02/02/2015   Procedure: HERNIA REPAIR UMBILICAL ADULT;  Surgeon: Robert Bellow, MD;  Location: [facility] ORS;  Service: General;  Laterality: N/A;  . VENTRAL HERNIA REPAIR N/A 02/02/2015   Procedure: LAPAROSCOPIC VENTRAL HERNIA;  Surgeon: Robert Bellow, MD;  Location: [facility] ORS;  Service: General;  Laterality: N/A;  . vp shunt placement  1979    There were no vitals filed for this visit.  Subjective Assessment - 07/12/17 1034    Subjective  Patient reports  wife is away on a vacation so is alone for the past week. Went to the doctor Friday and reports values were good however is pre-diabetic. Reports son is in ICU for ulcer bleeding.     Pertinent History  Pt reports a history of rapid onset balance difficulty since 2014. He reports that the initial episode occurred due to improper use of diuretic with hypokalemia. One year later he had another episode with a fall. He was sent to ENT and had a VNG study which was normal. ENT also ordered a head CT and MRI. MRI showed chronic R MCA infarct. Pt comes for [employer] 2-3x/wk. He has completed pulmonary rehab at [facility] twice. Pt denies dizziness or vertigo but reports difficulty with his balance     Limitations  Walking    Patient Stated Goals  Improve balance so that he only needs cane or no assistive device    Currently in Pain?  No/denies       Sit to stand from low mat table with practice on technique and equal weight shift. 11x. Able to put hands on knees to push to allow for functional transfer.    Seated adduction squeeze 10x with 5 second holds.; 2 sets   Ambulate 100 ft x3 trials  without AD; improved arm swing, first trial required two standing rest breaks   Seated abduction GTB single leg at a time 10x each leg. 2 sets    Resisted hamstring curls 2x10 each leg GTB   Standing with CGA throwing balls into bucket x 15 balls no LOB    Seated scapular squeezes with purse lipped breathing.     Pt. response to medical necessity: Patient will continue to benefit from skilled physical therapy to improve strength and balance for improved ambulatory mechanics and quality of life.                           PT Education - 07/12/17 1040    Education provided  Yes    Education Details  STS transfer, ambulation without AD, exercise technique     Person(s) Educated  Patient    Methods  Explanation;Demonstration;Verbal cues    Comprehension  Verbalized understanding;Returned  demonstration       PT Short Term Goals - 06/28/17 1031      PT SHORT TERM GOAL #1   Title  Patient will ambulate 90 ft without cane and without stopping to allow for increase in mobility and quality of life.     Baseline  two rest breaks without AD    Time  2    Period  Weeks    Status  Partially Met      PT SHORT TERM GOAL #2   Title  Patient will stand from standard height chair on first attempt to allow for improved mobility.     Baseline  requires x2 attempts     Time  2    Period  Weeks    Status  Partially Met        PT Long Term Goals - 06/28/17 1031      PT LONG TERM GOAL #1   Title  Pt will be independent with HEP in order to improve strength and balance in order to decrease fall risk and improve function at home and work.    Baseline  HEP compliant    Time  8    Period  Weeks    Status  Achieved      PT LONG TERM GOAL #2   Title  Pt will improve BERG to 48 points in order to demonstrate clinically significant improvement in balance.     Baseline  04/19/16: 38/56; 05/24/16: 44/56 06/16/16: 43/56 08/16/16: 47/56 7/31: 49/56; 11/07/16: 42/56 10/24: 45/56 11/21: 48/56    Time  8    Period  Weeks    Status  Achieved      PT LONG TERM GOAL #3   Title  Pt will decrease TUG to below 25 seconds in order to demonstrate decreased fall risk and improved LE strength       Baseline  04/19/16: 36.15 seconds; 05/24/16: 20sec; 06/16/16: 22.07 seconds;      Time  8    Period  Weeks    Status  Achieved      PT LONG TERM GOAL #4   Title  Pt will increase 10MWT by at least 0.13 m/s in order to demonstrate clinically significant improvement in community ambulation.     Baseline  04/19/16: 0.75 m/s; 05/24/2016 .833; 06/16/16: 11.4s = 0.88 m/s 7/3: 13 m =.9ms, 731: .714 9/4: .43; 11/07/16: 0.74 m/s 11/21: . 67 without AD 12/27: .69 m/s ,03/13/17 . 60 M/sec ; 2/11: .83 m/s  Time  8    Period  Days    Status  Achieved      PT LONG TERM GOAL #5   Title  Pt will decrease TUG to below 20 seconds  in order to demonstrate decreased fall risk and improved LE strength       Baseline  04/19/16: 36.15 seconds; 05/24/16: 20sec; 06/16/16: 22.07 seconds; 11/07/16: unable to perform today due to weakness with transfers 11/21: 30 seconds without AD; 03/03/17 Patient is not able to perform sit to stand today from low chair. , not able to assess  2/11: 28 seconds 4/11: 23 seconds 5/15: 23 without AD    Time  6    Period  Weeks    Status  Partially Met    Target Date  08/09/17      PT LONG TERM GOAL #6   Title  Patient will increase dynamic gait index score to >19/24 as to demonstrate reduced fall risk and improved dynamic gait balance for better safety with community/home ambulation.     Baseline  7/3: 14 8/7: 17/24; 11/07/16: 14/24 10/24: 14/24 12/27: 14/24; 03/13/17 14/24 2/11: 14/24; 5/15: 16/24     Time  6    Period  Weeks    Status  On-going            Plan - 07/12/17 1050    Clinical Impression Statement  Patient ambulated with improved step length and velocity with arm swing and weight shift equally over both LE's. Demonstrated ability to ambulate without AD and without rest break. Sit to stand from a low surface is challenging, however decreased need for rest breaks during 10x set noted with improved equal weight shift. Occasional challenge with eccentric portion of sit to stand with flop in last inch of return to seated position. Patient will continue to benefit from skilled physical therapy to improve strength and balance for improved ambulatory mechanics and quality of life.     Rehab Potential  Good    Clinical Impairments Affecting Rehab Potential  Positive: motivation; Negative: chronic balance dysfunction, chronic CVA    PT Frequency  1x / week    PT Duration  6 weeks    PT Treatment/Interventions  ADLs/Self Care Home Management;Cryotherapy;Moist Heat;Neuromuscular re-education;Gait training;Functional mobility training;Stair training;Therapeutic activities;Therapeutic exercise;Balance  training;Manual techniques;Aquatic Therapy;Canalith Repostioning;Electrical Stimulation;Traction;Ultrasound;DME Instruction;Patient/family education;Passive range of motion;Energy conservation;Vestibular    PT Next Visit Plan  walking without AD, STS    PT Home Exercise Plan  As prescribed    Consulted and Agree with Plan of Care  Patient       Patient will benefit from skilled therapeutic intervention in order to improve the following deficits and impairments:  Abnormal gait, Decreased activity tolerance, Decreased balance, Decreased strength, Difficulty walking, Obesity, Decreased coordination  Visit Diagnosis: Difficulty in walking, not elsewhere classified  Muscle weakness (generalized)     Problem List Patient Active Problem List   Diagnosis Date Noted  . Cough 03/20/2015  . Bronchitis, chronic obstructive w acute bronchitis ([place]) 03/20/2015  . Umbilical hernia without obstruction and without gangrene 01/12/2015  . Ventral hernia without obstruction or gangrene 01/02/2015   Janna Arch, PT, DPT   07/12/2017, 11:22 AM  [place] MAIN [facility] SERVICES [address] [place], [place], [address] Phone: [phone]   Fax:  [phone]  Name: Daniel Eaton. MRN: [id number] Date of Birth: September 02, 1940

## 2017-07-14 DIAGNOSIS — I482 Chronic atrial fibrillation: Secondary | ICD-10-CM | POA: Diagnosis not present

## 2017-07-14 DIAGNOSIS — I700 Atherosclerosis of aorta: Secondary | ICD-10-CM | POA: Diagnosis not present

## 2017-07-14 DIAGNOSIS — I129 Hypertensive chronic kidney disease with stage 1 through stage 4 chronic kidney disease, or unspecified chronic kidney disease: Secondary | ICD-10-CM | POA: Diagnosis not present

## 2017-07-14 DIAGNOSIS — I429 Cardiomyopathy, unspecified: Secondary | ICD-10-CM | POA: Diagnosis not present

## 2017-07-14 DIAGNOSIS — J42 Unspecified chronic bronchitis: Secondary | ICD-10-CM | POA: Diagnosis not present

## 2017-07-14 DIAGNOSIS — N183 Chronic kidney disease, stage 3 (moderate): Secondary | ICD-10-CM | POA: Diagnosis not present

## 2017-07-14 DIAGNOSIS — R7303 Prediabetes: Secondary | ICD-10-CM | POA: Insufficient documentation

## 2017-07-14 DIAGNOSIS — G4733 Obstructive sleep apnea (adult) (pediatric): Secondary | ICD-10-CM | POA: Diagnosis not present

## 2017-07-14 DIAGNOSIS — Z6841 Body Mass Index (BMI) 40.0 and over, adult: Secondary | ICD-10-CM | POA: Diagnosis not present

## 2017-07-19 ENCOUNTER — Ambulatory Visit: Payer: PPO | Attending: Internal Medicine

## 2017-07-19 DIAGNOSIS — R262 Difficulty in walking, not elsewhere classified: Secondary | ICD-10-CM | POA: Diagnosis not present

## 2017-07-19 DIAGNOSIS — M6281 Muscle weakness (generalized): Secondary | ICD-10-CM | POA: Diagnosis not present

## 2017-07-19 NOTE — Therapy (Signed)
[place] MAIN [facility] SERVICES [address] [place], [place], [address] Phone: [phone]   Fax:  [phone]  Physical Therapy Treatment  Patient Details  Name: Daniel Eaton. MRN: [id number] Date of Birth: 07-18-1940 No data recorded  Encounter Date: 07/19/2017  PT End of Session - 07/19/17 1355    Visit Number  52    Number of Visits  71    Date for PT Re-Evaluation  08/09/17    Authorization Type  4/10    PT Start Time  1347    PT Stop Time  1430    PT Time Calculation (min)  43 min    Equipment Utilized During Treatment  Gait belt min guard to S prn    Activity Tolerance  Patient tolerated treatment well;Patient limited by fatigue    Behavior During Therapy  WFL for tasks assessed/performed;Anxious       Past Medical History:  Diagnosis Date  . A-fib ([place])   . CHF (congestive heart failure) ([place])   . COPD (chronic obstructive pulmonary disease) ([place])   . Difficult intubation   . Hyperlipidemia   . Hypertension   . Hypothyroidism   . Kidney disease   . Sleep apnea   . Stroke ([place])   . Thyroid disease     Past Surgical History:  Procedure Laterality Date  . CARDIAC CATHETERIZATION    . CATARACT EXTRACTION W/ INTRAOCULAR LENS  IMPLANT, BILATERAL    . COLONOSCOPY  2008  . HERNIA REPAIR     bilateral inguinal hernia/ [place]  . HERNIA REPAIR  02/02/2015   18 x 28 cm ventral light mesh placed laparoscopically.  . TONSILLECTOMY    . UMBILICAL HERNIA REPAIR N/A 02/02/2015   Procedure: HERNIA REPAIR UMBILICAL ADULT;  Surgeon: Robert Bellow, MD;  Location: [facility] ORS;  Service: General;  Laterality: N/A;  . VENTRAL HERNIA REPAIR N/A 02/02/2015   Procedure: LAPAROSCOPIC VENTRAL HERNIA;  Surgeon: Robert Bellow, MD;  Location: [facility] ORS;  Service: General;  Laterality: N/A;  . vp shunt placement  1979    There were no vitals filed for this visit.  Subjective Assessment - 07/19/17 1351    Subjective  Patient reports  physically is feeling well. Has been busy working [employer] Reports no concerns or complaints at this time.     Pertinent History  Pt reports a history of rapid onset balance difficulty since 2014. He reports that the initial episode occurred due to improper use of diuretic with hypokalemia. One year later he had another episode with a fall. He was sent to ENT and had a VNG study which was normal. ENT also ordered a head CT and MRI. MRI showed chronic R MCA infarct. Pt comes for [employer] 2-3x/wk. He has completed pulmonary rehab at [facility] twice. Pt denies dizziness or vertigo but reports difficulty with his balance     Limitations  Walking    Patient Stated Goals  Improve balance so that he only needs cane or no assistive device    Currently in Pain?  No/denies        Nustep lvl 4 LE only 19mnutes ; cues for keeping above 60 rpm for cardiovascular challenge   Sit to stand from low mat table with practice on technique and equal weight shift. 11x. Able to put hands on knees to push to allow for functional transfer.; x2 trials     Seated adduction squeeze 10x with 5 second holds.; 2 sets  Ambulate 100 ft x3 trials without AD; improved arm swing, first trial required one standing rest breaks   Seated abduction GTB 10x each leg. 2 sets      Seated scapular squeezes with purse lipped breathing.     Pt. response to medical necessity: Patient will continue to benefit from skilled physical therapy to improve strength and balance for improved ambulatory mechanics and quality of life.                            PT Education - 07/19/17 1354    Education provided  Yes    Education Details  STS transfer, exercise technique, ambulation     Person(s) Educated  Patient    Methods  Explanation;Demonstration;Verbal cues    Comprehension  Verbalized understanding;Returned demonstration       PT Short Term Goals - 06/28/17 1031      PT SHORT TERM GOAL #1   Title  Patient will  ambulate 90 ft without cane and without stopping to allow for increase in mobility and quality of life.     Baseline  two rest breaks without AD    Time  2    Period  Weeks    Status  Partially Met      PT SHORT TERM GOAL #2   Title  Patient will stand from standard height chair on first attempt to allow for improved mobility.     Baseline  requires x2 attempts     Time  2    Period  Weeks    Status  Partially Met        PT Long Term Goals - 06/28/17 1031      PT LONG TERM GOAL #1   Title  Pt will be independent with HEP in order to improve strength and balance in order to decrease fall risk and improve function at home and work.    Baseline  HEP compliant    Time  8    Period  Weeks    Status  Achieved      PT LONG TERM GOAL #2   Title  Pt will improve BERG to 48 points in order to demonstrate clinically significant improvement in balance.     Baseline  04/19/16: 38/56; 05/24/16: 44/56 06/16/16: 43/56 08/16/16: 47/56 7/31: 49/56; 11/07/16: 42/56 10/24: 45/56 11/21: 48/56    Time  8    Period  Weeks    Status  Achieved      PT LONG TERM GOAL #3   Title  Pt will decrease TUG to below 25 seconds in order to demonstrate decreased fall risk and improved LE strength       Baseline  04/19/16: 36.15 seconds; 05/24/16: 20sec; 06/16/16: 22.07 seconds;      Time  8    Period  Weeks    Status  Achieved      PT LONG TERM GOAL #4   Title  Pt will increase 10MWT by at least 0.13 m/s in order to demonstrate clinically significant improvement in community ambulation.     Baseline  04/19/16: 0.75 m/s; 05/24/2016 .833; 06/16/16: 11.4s = 0.88 m/s 7/3: 13 m =.71ms, 731: .714 9/4: .43; 11/07/16: 0.74 m/s 11/21: . 67 without AD 12/27: .69 m/s ,03/13/17 . 60 M/sec ; 2/11: .83 m/s     Time  8    Period  Days    Status  Achieved      PT LONG TERM  GOAL #5   Title  Pt will decrease TUG to below 20 seconds in order to demonstrate decreased fall risk and improved LE strength       Baseline  04/19/16: 36.15 seconds;  05/24/16: 20sec; 06/16/16: 22.07 seconds; 11/07/16: unable to perform today due to weakness with transfers 11/21: 30 seconds without AD; 03/03/17 Patient is not able to perform sit to stand today from low chair. , not able to assess  2/11: 28 seconds 4/11: 23 seconds 5/15: 23 without AD    Time  6    Period  Weeks    Status  Partially Met    Target Date  08/09/17      PT LONG TERM GOAL #6   Title  Patient will increase dynamic gait index score to >19/24 as to demonstrate reduced fall risk and improved dynamic gait balance for better safety with community/home ambulation.     Baseline  7/3: 14 8/7: 17/24; 11/07/16: 14/24 10/24: 14/24 12/27: 14/24; 03/13/17 14/24 2/11: 14/24; 5/15: 16/24     Time  6    Period  Weeks    Status  On-going            Plan - 07/19/17 1409    Clinical Impression Statement  Patient presents more steady while utilizing new sketchers tennis shoes while ambulating without AD. Improved sit to stand noted with decreased eccentric flopping episodes. Improved step length and velocity of ambulation. Patient will continue to benefit from skilled physical therapy to improve strength and balance for improved ambulatory mechanics and quality of life.     Rehab Potential  Good    Clinical Impairments Affecting Rehab Potential  Positive: motivation; Negative: chronic balance dysfunction, chronic CVA    PT Frequency  1x / week    PT Duration  6 weeks    PT Treatment/Interventions  ADLs/Self Care Home Management;Cryotherapy;Moist Heat;Neuromuscular re-education;Gait training;Functional mobility training;Stair training;Therapeutic activities;Therapeutic exercise;Balance training;Manual techniques;Aquatic Therapy;Canalith Repostioning;Electrical Stimulation;Traction;Ultrasound;DME Instruction;Patient/family education;Passive range of motion;Energy conservation;Vestibular    PT Next Visit Plan  walking without AD, STS    PT Home Exercise Plan  As prescribed    Consulted and Agree with  Plan of Care  Patient       Patient will benefit from skilled therapeutic intervention in order to improve the following deficits and impairments:  Abnormal gait, Decreased activity tolerance, Decreased balance, Decreased strength, Difficulty walking, Obesity, Decreased coordination  Visit Diagnosis: Difficulty in walking, not elsewhere classified  Muscle weakness (generalized)     Problem List Patient Active Problem List   Diagnosis Date Noted  . Cough 03/20/2015  . Bronchitis, chronic obstructive w acute bronchitis ([place]) 03/20/2015  . Umbilical hernia without obstruction and without gangrene 01/12/2015  . Ventral hernia without obstruction or gangrene 01/02/2015  Janna Arch, PT, DPT    07/19/2017, 2:34 PM  [place] MAIN [facility] SERVICES [address] [place], [place], [address] Phone: [phone]   Fax:  [phone]  Name: Cavin Longman. MRN: [id number] Date of Birth: [birth year]

## 2017-07-27 ENCOUNTER — Ambulatory Visit: Payer: PPO

## 2017-07-27 DIAGNOSIS — R262 Difficulty in walking, not elsewhere classified: Secondary | ICD-10-CM

## 2017-07-27 DIAGNOSIS — G4733 Obstructive sleep apnea (adult) (pediatric): Secondary | ICD-10-CM | POA: Diagnosis not present

## 2017-07-27 DIAGNOSIS — M6281 Muscle weakness (generalized): Secondary | ICD-10-CM

## 2017-07-27 NOTE — Therapy (Signed)
[place] MAIN [facility] SERVICES [address] [place], [place], [address] Phone: [phone]   Fax:  [phone]  Physical Therapy Treatment  Patient Details  Name: Daniel Eaton. MRN: [id number] Date of Birth: 1940-09-29 No data recorded  Encounter Date: 07/27/2017  PT End of Session - 07/27/17 1528    Visit Number  68    Number of Visits  71    Date for PT Re-Evaluation  08/09/17    Authorization Type  5/10    PT Start Time  1520    PT Stop Time  1600    PT Time Calculation (min)  40 min    Equipment Utilized During Treatment  Gait belt min guard to S prn    Activity Tolerance  Patient tolerated treatment well;Patient limited by fatigue    Behavior During Therapy  WFL for tasks assessed/performed;Anxious       Past Medical History:  Diagnosis Date  . A-fib ([place])   . CHF (congestive heart failure) ([place])   . COPD (chronic obstructive pulmonary disease) ([place])   . Difficult intubation   . Hyperlipidemia   . Hypertension   . Hypothyroidism   . Kidney disease   . Sleep apnea   . Stroke ([place])   . Thyroid disease     Past Surgical History:  Procedure Laterality Date  . CARDIAC CATHETERIZATION    . CATARACT EXTRACTION W/ INTRAOCULAR LENS  IMPLANT, BILATERAL    . COLONOSCOPY  2008  . HERNIA REPAIR     bilateral inguinal hernia/ [facility]  . HERNIA REPAIR  02/02/2015   18 x 28 cm ventral light mesh placed laparoscopically.  . TONSILLECTOMY    . UMBILICAL HERNIA REPAIR N/A 02/02/2015   Procedure: HERNIA REPAIR UMBILICAL ADULT;  Surgeon: Robert Bellow, MD;  Location: [facility] ORS;  Service: General;  Laterality: N/A;  . VENTRAL HERNIA REPAIR N/A 02/02/2015   Procedure: LAPAROSCOPIC VENTRAL HERNIA;  Surgeon: Robert Bellow, MD;  Location: [facility] ORS;  Service: General;  Laterality: N/A;  . vp shunt placement  1979    There were no vitals filed for this visit.  Subjective Assessment - 07/27/17 1525    Subjective  Patient reports  new breathing medication is working, allowing him to clear more "junk" off his chest. Reports no more pain than normal.     Pertinent History  Pt reports a history of rapid onset balance difficulty since 2014. He reports that the initial episode occurred due to improper use of diuretic with hypokalemia. One year later he had another episode with a fall. He was sent to ENT and had a VNG study which was normal. ENT also ordered a head CT and MRI. MRI showed chronic R MCA infarct. Pt comes for [employer] 2-3x/wk. He has completed pulmonary rehab at [facility] twice. Pt denies dizziness or vertigo but reports difficulty with his balance     Limitations  Walking    Patient Stated Goals  Improve balance so that he only needs cane or no assistive device    Currently in Pain?  Yes    Pain Score  2     Pain Location  Shoulder    Pain Orientation  Right;Left    Pain Descriptors / Indicators  Aching    Pain Type  Chronic pain    Pain Frequency  Intermittent         Nustep lvl 5 46mnutes ; cues for keeping above 60 rpm for cardiovascular challenge  Step over and back half foam roller with single UE support.   Negotiate stairs with single UE support (R railing); educated on how to side step up and down stairs for carryover to home environment for safe negotiation.   Standing marches 10x each leg BUE support; clicking of R knee  Static balance balloon tap game 2 minutes  Seated adduction squeeze 10x with 5 second holds.  Seated abduction GTB 10x 5 second holds    Seated scapular squeezes with purse lipped breathing.     Pt. response to medical necessity: Patient will continue to benefit from skilled physical therapy to improve strength and balance for improved ambulatory mechanics and quality of life.                          PT Education - 07/27/17 1528    Education provided  Yes    Education Details  exercise technique, functional mobility     Person(s) Educated  Patient     Methods  Explanation;Demonstration;Verbal cues    Comprehension  Verbalized understanding;Returned demonstration       PT Short Term Goals - 06/28/17 1031      PT SHORT TERM GOAL #1   Title  Patient will ambulate 90 ft without cane and without stopping to allow for increase in mobility and quality of life.     Baseline  two rest breaks without AD    Time  2    Period  Weeks    Status  Partially Met      PT SHORT TERM GOAL #2   Title  Patient will stand from standard height chair on first attempt to allow for improved mobility.     Baseline  requires x2 attempts     Time  2    Period  Weeks    Status  Partially Met        PT Long Term Goals - 06/28/17 1031      PT LONG TERM GOAL #1   Title  Pt will be independent with HEP in order to improve strength and balance in order to decrease fall risk and improve function at home and work.    Baseline  HEP compliant    Time  8    Period  Weeks    Status  Achieved      PT LONG TERM GOAL #2   Title  Pt will improve BERG to 48 points in order to demonstrate clinically significant improvement in balance.     Baseline  04/19/16: 38/56; 05/24/16: 44/56 06/16/16: 43/56 08/16/16: 47/56 7/31: 49/56; 11/07/16: 42/56 10/24: 45/56 11/21: 48/56    Time  8    Period  Weeks    Status  Achieved      PT LONG TERM GOAL #3   Title  Pt will decrease TUG to below 25 seconds in order to demonstrate decreased fall risk and improved LE strength       Baseline  04/19/16: 36.15 seconds; 05/24/16: 20sec; 06/16/16: 22.07 seconds;      Time  8    Period  Weeks    Status  Achieved      PT LONG TERM GOAL #4   Title  Pt will increase 10MWT by at least 0.13 m/s in order to demonstrate clinically significant improvement in community ambulation.     Baseline  04/19/16: 0.75 m/s; 05/24/2016 .833; 06/16/16: 11.4s = 0.88 m/s 7/3: 13 m =.65ms, 731: .714 9/4: .43; 11/07/16:  0.74 m/s 11/21: . 67 without AD 12/27: .69 m/s ,03/13/17 . 60 M/sec ; 2/11: .83 m/s     Time  8    Period   Days    Status  Achieved      PT LONG TERM GOAL #5   Title  Pt will decrease TUG to below 20 seconds in order to demonstrate decreased fall risk and improved LE strength       Baseline  04/19/16: 36.15 seconds; 05/24/16: 20sec; 06/16/16: 22.07 seconds; 11/07/16: unable to perform today due to weakness with transfers 11/21: 30 seconds without AD; 03/03/17 Patient is not able to perform sit to stand today from low chair. , not able to assess  2/11: 28 seconds 4/11: 23 seconds 5/15: 23 without AD    Time  6    Period  Weeks    Status  Partially Met    Target Date  08/09/17      PT LONG TERM GOAL #6   Title  Patient will increase dynamic gait index score to >19/24 as to demonstrate reduced fall risk and improved dynamic gait balance for better safety with community/home ambulation.     Baseline  7/3: 14 8/7: 17/24; 11/07/16: 14/24 10/24: 14/24 12/27: 14/24; 03/13/17 14/24 2/11: 14/24; 5/15: 16/24     Time  6    Period  Weeks    Status  On-going            Plan - 07/27/17 1546    Clinical Impression Statement  Patient educated on and demonstrated understanding of side stepping when performing step negotiation for carryover into home environment. Progression to standing interventions for stability and mobility performed with occasional seated rest breaks. Patient will continue to benefit from skilled physical therapy to improve strength and balance for improved ambulatory mechanics and quality of life.    Rehab Potential  Good    Clinical Impairments Affecting Rehab Potential  Positive: motivation; Negative: chronic balance dysfunction, chronic CVA    PT Frequency  1x / week    PT Duration  6 weeks    PT Treatment/Interventions  ADLs/Self Care Home Management;Cryotherapy;Moist Heat;Neuromuscular re-education;Gait training;Functional mobility training;Stair training;Therapeutic activities;Therapeutic exercise;Balance training;Manual techniques;Aquatic Therapy;Canalith Repostioning;Electrical  Stimulation;Traction;Ultrasound;DME Instruction;Patient/family education;Passive range of motion;Energy conservation;Vestibular    PT Next Visit Plan  walking without AD, STS    PT Home Exercise Plan  As prescribed    Consulted and Agree with Plan of Care  Patient       Patient will benefit from skilled therapeutic intervention in order to improve the following deficits and impairments:  Abnormal gait, Decreased activity tolerance, Decreased balance, Decreased strength, Difficulty walking, Obesity, Decreased coordination  Visit Diagnosis: Difficulty in walking, not elsewhere classified  Muscle weakness (generalized)     Problem List Patient Active Problem List   Diagnosis Date Noted  . Cough 03/20/2015  . Bronchitis, chronic obstructive w acute bronchitis ([place]) 03/20/2015  . Umbilical hernia without obstruction and without gangrene 01/12/2015  . Ventral hernia without obstruction or gangrene 01/02/2015   Janna Arch, PT, DPT   07/27/2017, 4:01 PM  [place] MAIN [facility] SERVICES [address] [place], [place], [address] Phone: [phone]   Fax:  [phone]  Name: Daniel Eaton. MRN: [id number] Date of Birth: [birth year]

## 2017-08-02 ENCOUNTER — Ambulatory Visit: Payer: PPO

## 2017-08-02 DIAGNOSIS — M6281 Muscle weakness (generalized): Secondary | ICD-10-CM

## 2017-08-02 DIAGNOSIS — R262 Difficulty in walking, not elsewhere classified: Secondary | ICD-10-CM | POA: Diagnosis not present

## 2017-08-02 NOTE — Therapy (Signed)
[place] MAIN [facility] SERVICES [address] [place], [place], [address] Phone: [phone]   Fax:  [phone]  Physical Therapy Treatment  Patient Details  Name: James Senn. MRN: [id number] Date of Birth: 1940-06-04 No data recorded  Encounter Date: 08/02/2017  PT End of Session - 08/02/17 1535    Visit Number  10    Number of Visits  71    Date for PT Re-Evaluation  08/09/17    Authorization Type  6/10    PT Start Time  1520    PT Stop Time  1600    PT Time Calculation (min)  40 min    Equipment Utilized During Treatment  Gait belt min guard to S prn    Activity Tolerance  Patient tolerated treatment well;Patient limited by fatigue    Behavior During Therapy  WFL for tasks assessed/performed;Anxious       Past Medical History:  Diagnosis Date  . A-fib ([place])   . CHF (congestive heart failure) ([place])   . COPD (chronic obstructive pulmonary disease) ([place])   . Difficult intubation   . Hyperlipidemia   . Hypertension   . Hypothyroidism   . Kidney disease   . Sleep apnea   . Stroke ([place])   . Thyroid disease     Past Surgical History:  Procedure Laterality Date  . CARDIAC CATHETERIZATION    . CATARACT EXTRACTION W/ INTRAOCULAR LENS  IMPLANT, BILATERAL    . COLONOSCOPY  2008  . HERNIA REPAIR     bilateral inguinal hernia/ [place]  . HERNIA REPAIR  02/02/2015   18 x 28 cm ventral light mesh placed laparoscopically.  . TONSILLECTOMY    . UMBILICAL HERNIA REPAIR N/A 02/02/2015   Procedure: HERNIA REPAIR UMBILICAL ADULT;  Surgeon: Robert Bellow, MD;  Location: [facility] ORS;  Service: General;  Laterality: N/A;  . VENTRAL HERNIA REPAIR N/A 02/02/2015   Procedure: LAPAROSCOPIC VENTRAL HERNIA;  Surgeon: Robert Bellow, MD;  Location: [facility] ORS;  Service: General;  Laterality: N/A;  . vp shunt placement  1979    There were no vitals filed for this visit.  Subjective Assessment - 08/02/17 1533    Subjective  Patient reports  feeling fatigued because of cleaning house and closets. Reports compliance with HEP.     Pertinent History  Pt reports a history of rapid onset balance difficulty since 2014. He reports that the initial episode occurred due to improper use of diuretic with hypokalemia. One year later he had another episode with a fall. He was sent to ENT and had a VNG study which was normal. ENT also ordered a head CT and MRI. MRI showed chronic R MCA infarct. Pt comes for [employer] 2-3x/wk. He has completed pulmonary rehab at [facility] twice. Pt denies dizziness or vertigo but reports difficulty with his balance     Limitations  Walking    Patient Stated Goals  Improve balance so that he only needs cane or no assistive device    Currently in Pain?  No/denies     shift L and R over plinth corner to replicate getting in and out of booth and car x 3   Sit to stand fromlowmat table with practice on technique and equal weight shift. 11x.Able to put hands on knees to push to allow for functional transfer.; x2 trials    Ambulate 100 ft x4trialswithout AD; improved arm swing; last attempt performed two consecutive with one single standing rest break 3 seconds.  2000 G weighted ball overhead raises for core and postural strengthening : terminated due to left shoulder pain  2000 G weighted ball rotational core activation 10x    Seated adduction squeeze 10x with 5 second holds.;   Seated abduction GTB 10x ; one at a time   Seated scapular squeezes with purse lipped breathing.      Pt. response to medical necessity: Patient will continue to benefit from skilled physical therapy to improve strength and balance for improved ambulatory mechanics and quality of life.                           PT Education - 08/02/17 1534    Education provided  Yes    Education Details  exercise technique, functional mobility     Person(s) Educated  Patient    Methods  Explanation;Demonstration;Verbal cues     Comprehension  Verbalized understanding;Returned demonstration       PT Short Term Goals - 06/28/17 1031      PT SHORT TERM GOAL #1   Title  Patient will ambulate 90 ft without cane and without stopping to allow for increase in mobility and quality of life.     Baseline  two rest breaks without AD    Time  2    Period  Weeks    Status  Partially Met      PT SHORT TERM GOAL #2   Title  Patient will stand from standard height chair on first attempt to allow for improved mobility.     Baseline  requires x2 attempts     Time  2    Period  Weeks    Status  Partially Met        PT Long Term Goals - 06/28/17 1031      PT LONG TERM GOAL #1   Title  Pt will be independent with HEP in order to improve strength and balance in order to decrease fall risk and improve function at home and work.    Baseline  HEP compliant    Time  8    Period  Weeks    Status  Achieved      PT LONG TERM GOAL #2   Title  Pt will improve BERG to 48 points in order to demonstrate clinically significant improvement in balance.     Baseline  04/19/16: 38/56; 05/24/16: 44/56 06/16/16: 43/56 08/16/16: 47/56 7/31: 49/56; 11/07/16: 42/56 10/24: 45/56 11/21: 48/56    Time  8    Period  Weeks    Status  Achieved      PT LONG TERM GOAL #3   Title  Pt will decrease TUG to below 25 seconds in order to demonstrate decreased fall risk and improved LE strength       Baseline  04/19/16: 36.15 seconds; 05/24/16: 20sec; 06/16/16: 22.07 seconds;      Time  8    Period  Weeks    Status  Achieved      PT LONG TERM GOAL #4   Title  Pt will increase 10MWT by at least 0.13 m/s in order to demonstrate clinically significant improvement in community ambulation.     Baseline  04/19/16: 0.75 m/s; 05/24/2016 .833; 06/16/16: 11.4s = 0.88 m/s 7/3: 13 m =.32ms, 731: .714 9/4: .43; 11/07/16: 0.74 m/s 11/21: . 67 without AD 12/27: .69 m/s ,03/13/17 . 60 M/sec ; 2/11: .83 m/s     Time  8  Period  Days    Status  Achieved      PT LONG TERM GOAL  #5   Title  Pt will decrease TUG to below 20 seconds in order to demonstrate decreased fall risk and improved LE strength       Baseline  04/19/16: 36.15 seconds; 05/24/16: 20sec; 06/16/16: 22.07 seconds; 11/07/16: unable to perform today due to weakness with transfers 11/21: 30 seconds without AD; 03/03/17 Patient is not able to perform sit to stand today from low chair. , not able to assess  2/11: 28 seconds 4/11: 23 seconds 5/15: 23 without AD    Time  6    Period  Weeks    Status  Partially Met    Target Date  08/09/17      PT LONG TERM GOAL #6   Title  Patient will increase dynamic gait index score to >19/24 as to demonstrate reduced fall risk and improved dynamic gait balance for better safety with community/home ambulation.     Baseline  7/3: 14 8/7: 17/24; 11/07/16: 14/24 10/24: 14/24 12/27: 14/24; 03/13/17 14/24 2/11: 14/24; 5/15: 16/24     Time  6    Period  Weeks    Status  On-going            Plan - 08/02/17 1543    Clinical Impression Statement  Patient has noted increased weightbearing onto RLE due to fatigue in LLE. Patient improving with ability to ambulate without AD at this time on stable surfaces; performing two consecutive laps for the first time in therapy history of patient. Improving velocity with improved forward momentum rather than sideways trunk lean. Patient will continue to benefit from skilled physical therapy to improve strength and balance for improved ambulatory mechanics and quality of life.    Rehab Potential  Good    Clinical Impairments Affecting Rehab Potential  Positive: motivation; Negative: chronic balance dysfunction, chronic CVA    PT Frequency  1x / week    PT Duration  6 weeks    PT Treatment/Interventions  ADLs/Self Care Home Management;Cryotherapy;Moist Heat;Neuromuscular re-education;Gait training;Functional mobility training;Stair training;Therapeutic activities;Therapeutic exercise;Balance training;Manual techniques;Aquatic Therapy;Canalith  Repostioning;Electrical Stimulation;Traction;Ultrasound;DME Instruction;Patient/family education;Passive range of motion;Energy conservation;Vestibular    PT Next Visit Plan  walking without AD, STS    PT Home Exercise Plan  As prescribed    Consulted and Agree with Plan of Care  Patient       Patient will benefit from skilled therapeutic intervention in order to improve the following deficits and impairments:  Abnormal gait, Decreased activity tolerance, Decreased balance, Decreased strength, Difficulty walking, Obesity, Decreased coordination  Visit Diagnosis: Difficulty in walking, not elsewhere classified  Muscle weakness (generalized)     Problem List Patient Active Problem List   Diagnosis Date Noted  . Cough 03/20/2015  . Bronchitis, chronic obstructive w acute bronchitis ([place]) 03/20/2015  . Umbilical hernia without obstruction and without gangrene 01/12/2015  . Ventral hernia without obstruction or gangrene 01/02/2015   Janna Arch, PT, DPT   08/02/2017, 4:00 PM  [place] MAIN [facility] SERVICES [address] [place], [place], [address] Phone: [phone]   Fax:  [phone]  Name: Arizona Nordquist. MRN: [id number] Date of Birth: 06-05-1940

## 2017-08-07 DIAGNOSIS — M79674 Pain in right toe(s): Secondary | ICD-10-CM | POA: Diagnosis not present

## 2017-08-07 DIAGNOSIS — M79675 Pain in left toe(s): Secondary | ICD-10-CM | POA: Diagnosis not present

## 2017-08-07 DIAGNOSIS — B351 Tinea unguium: Secondary | ICD-10-CM | POA: Diagnosis not present

## 2017-08-09 ENCOUNTER — Ambulatory Visit: Payer: PPO

## 2017-08-09 DIAGNOSIS — M6281 Muscle weakness (generalized): Secondary | ICD-10-CM

## 2017-08-09 DIAGNOSIS — R262 Difficulty in walking, not elsewhere classified: Secondary | ICD-10-CM

## 2017-08-09 NOTE — Therapy (Signed)
[place] MAIN [facility] SERVICES [address] [place], [place], [address] Phone: [phone]   Fax:  [phone]  Physical Therapy Treatment Physical Therapy Progress Note   Dates of reporting period  06/28/17 to   08/09/17   Patient Details  Name: Daniel Eaton. MRN: [id number] Date of Birth: 1941/01/20 No data recorded  Encounter Date: 08/09/2017  PT End of Session - 08/09/17 1546    Visit Number  70    Number of Visits  45    Date for PT Re-Evaluation  09/20/17    Authorization Type  7/10 (next session 1/10)    PT Start Time  1518    PT Stop Time  1558    PT Time Calculation (min)  40 min    Equipment Utilized During Treatment  Gait belt min guard to S prn    Activity Tolerance  Patient tolerated treatment well;Patient limited by fatigue    Behavior During Therapy  WFL for tasks assessed/performed;Anxious       Past Medical History:  Diagnosis Date  . A-fib ([place])   . CHF (congestive heart failure) ([place])   . COPD (chronic obstructive pulmonary disease) ([place])   . Difficult intubation   . Hyperlipidemia   . Hypertension   . Hypothyroidism   . Kidney disease   . Sleep apnea   . Stroke ([place])   . Thyroid disease     Past Surgical History:  Procedure Laterality Date  . CARDIAC CATHETERIZATION    . CATARACT EXTRACTION W/ INTRAOCULAR LENS  IMPLANT, BILATERAL    . COLONOSCOPY  2008  . HERNIA REPAIR     bilateral inguinal hernia/ [place]  . HERNIA REPAIR  02/02/2015   18 x 28 cm ventral light mesh placed laparoscopically.  . TONSILLECTOMY    . UMBILICAL HERNIA REPAIR N/A 02/02/2015   Procedure: HERNIA REPAIR UMBILICAL ADULT;  Surgeon: Robert Bellow, MD;  Location: [facility] ORS;  Service: General;  Laterality: N/A;  . VENTRAL HERNIA REPAIR N/A 02/02/2015   Procedure: LAPAROSCOPIC VENTRAL HERNIA;  Surgeon: Robert Bellow, MD;  Location: [facility] ORS;  Service: General;  Laterality: N/A;  . vp shunt placement  1979    There were  no vitals filed for this visit.  Subjective Assessment - 08/09/17 1522    Subjective  Patient reports severe soreness in LEs due to excessive walking and stair negotiation yesterday and the day prior.     Pertinent History  Pt reports a history of rapid onset balance difficulty since 2014. He reports that the initial episode occurred due to improper use of diuretic with hypokalemia. One year later he had another episode with a fall. He was sent to ENT and had a VNG study which was normal. ENT also ordered a head CT and MRI. MRI showed chronic R MCA infarct. Pt comes for [employer] 2-3x/wk. He has completed pulmonary rehab at [facility] twice. Pt denies dizziness or vertigo but reports difficulty with his balance     Limitations  Walking    Patient Stated Goals  Improve balance so that he only needs cane or no assistive device    Currently in Pain?  Yes    Pain Score  5     Pain Location  Leg    Pain Orientation  Right;Left    Pain Descriptors / Indicators  Aching    Pain Type  Chronic pain    Pain Onset  More than a month ago  Pain Frequency  Intermittent      Patient's condition has the potential to improve in response to therapy. Maximum improvement is yet to be obtained. The anticipated improvement is attainable and reasonable in a generally predictable time. Start date of reporting period 06/28/17 end date of reporting period 08/09/17 Patient reports that his stability is improving with decreased need for UE support. Feels leg strength is improving.    RECERT 90 ft without cane: ambulated last session 120 ft  STS able to perform last session 5x in a row.  TUG: unable to perform this session due to pain. DGI 16/24  10 MWT: 13 seconds without AD=.54ms  LEFS=18/80  Seated interventions: Seated adduction squeeze 10x with 5 second holds.;2 sets   Seated abduction GTB 10x; one at a time  Seated scapular squeezes with purse lipped breathing.  Seated marches 10x each leg, GTB    Seated hamstring curls GTB 10x each leg  Pt. response to medical necessity: Patient will continue to benefit from skilled physical therapy to improve strength and balance for improved ambulatory mechanics and quality of life.     [facility] - 08/09/17 0001      Dynamic Gait Index   Level Surface  Normal    Change in Gait Speed  Normal    Gait with Horizontal Head Turns  Mild Impairment    Gait with Vertical Head Turns  Mild Impairment    Gait and Pivot Turn  Mild Impairment    Step Over Obstacle  Moderate Impairment    Step Around Obstacles  Mild Impairment    Steps  Moderate Impairment    Total Score  16                           PT Education - 08/09/17 1549    Education provided  Yes    Education Details  goal progression, POC, exercise technique     Person(s) Educated  Patient    Methods  Explanation;Demonstration;Verbal cues    Comprehension  Verbalized understanding;Returned demonstration       PT Short Term Goals - 08/09/17 1529      PT SHORT TERM GOAL #1   Title  Patient will ambulate 90 ft without cane and without stopping to allow for increase in mobility and quality of life.     Baseline  6/19: walk 120 ft without rest break without AD     Time  2    Period  Weeks    Status  Achieved      PT SHORT TERM GOAL #2   Title  Patient will stand from standard height chair on first attempt to allow for improved mobility.     Baseline  able to perform 6/19; unable to perform today due to pain.     Time  2    Period  Weeks    Status  Partially Met        PT Long Term Goals - 08/09/17 1530      PT LONG TERM GOAL #1   Title  Pt will be independent with HEP in order to improve strength and balance in order to decrease fall risk and improve function at home and work.    Baseline  HEP compliant    Time  8    Period  Weeks    Status  Achieved      PT LONG TERM GOAL #2   Title  Pt will  improve BERG to 48 points in order to  demonstrate clinically significant improvement in balance.     Baseline  04/19/16: 38/56; 05/24/16: 44/56 06/16/16: 43/56 08/16/16: 47/56 7/31: 49/56; 11/07/16: 42/56 10/24: 45/56 11/21: 48/56    Time  8    Period  Weeks    Status  Achieved      PT LONG TERM GOAL #3   Title  Pt will decrease TUG to below 25 seconds in order to demonstrate decreased fall risk and improved LE strength       Baseline  04/19/16: 36.15 seconds; 05/24/16: 20sec; 06/16/16: 22.07 seconds;      Time  8    Period  Weeks    Status  Achieved      PT LONG TERM GOAL #4   Title  Pt will increase 10MWT by at least 0.13 m/s in order to demonstrate clinically significant improvement in community ambulation.     Baseline  04/19/16: 0.75 m/s; 05/24/2016 .833; 06/16/16: 11.4s = 0.88 m/s 7/3: 13 m =.70ms, 731: .714 9/4: .43; 11/07/16: 0.74 m/s 11/21: . 67 without AD 12/27: .69 m/s ,03/13/17 . 60 M/sec ; 2/11: .83 m/s  6/26: .711m    Time  8    Period  Days    Status  Achieved      PT LONG TERM GOAL #5   Title  Pt will decrease TUG to below 20 seconds in order to demonstrate decreased fall risk and improved LE strength       Baseline  04/19/16: 36.15 seconds; 05/24/16: 20sec; 06/16/16: 22.07 seconds; 11/07/16: unable to perform today due to weakness with transfers 11/21: 30 seconds without AD; 03/03/17 Patient is not able to perform sit to stand today from low chair. , not able to assess  2/11: 28 seconds 4/11: 23 seconds 5/15: 23 without AD    Time  6    Period  Weeks    Status  Partially Met    Target Date  09/20/17      Additional Long Term Goals   Additional Long Term Goals  Yes      PT LONG TERM GOAL #6   Title  Patient will increase dynamic gait index score to >19/24 as to demonstrate reduced fall risk and improved dynamic gait balance for better safety with community/home ambulation.     Baseline  7/3: 14 8/7: 17/24; 11/07/16: 14/24 10/24: 14/24 12/27: 14/24; 03/13/17 14/24 2/11: 14/24; 5/15: 16/24 6/26: 16/24    Time  6    Period  Weeks     Status  On-going    Target Date  09/20/17      PT LONG TERM GOAL #7   Title  Patient will increase lower extremity functional scale to >40/80 to demonstrate improved functional mobility and increased tolerance with ADLs.     Baseline  6/26: 18/80    Time  6    Period  Weeks    Status  New    Target Date  09/20/17            Plan - 08/09/17 1546    Clinical Impression Statement  Patient's excessive LE soreness and pain limited goal testing this session. Last session however patient demonstrated progression with independent ambulation, ambulating around gym multiple trials without AD without rest breaks. Patient's LE's strength is improving with increased stability allowing progression of mobility with decreased need for assistive device. Patient's condition has the potential to improve in response to therapy. Maximum improvement is yet to  be obtained. The anticipated improvement is attainable and reasonable in a generally predictable time.Patient will continue to benefit from skilled physical therapy to improve strength and balance for improved ambulatory mechanics and quality of life.    Rehab Potential  Good    Clinical Impairments Affecting Rehab Potential  Positive: motivation; Negative: chronic balance dysfunction, chronic CVA    PT Frequency  1x / week    PT Duration  6 weeks    PT Treatment/Interventions  ADLs/Self Care Home Management;Cryotherapy;Moist Heat;Neuromuscular re-education;Gait training;Functional mobility training;Stair training;Therapeutic activities;Therapeutic exercise;Balance training;Manual techniques;Aquatic Therapy;Canalith Repostioning;Electrical Stimulation;Traction;Ultrasound;DME Instruction;Patient/family education;Passive range of motion;Energy conservation;Vestibular    PT Next Visit Plan  walking without AD, STS    PT Home Exercise Plan  As prescribed    Consulted and Agree with Plan of Care  Patient       Patient will benefit from skilled  therapeutic intervention in order to improve the following deficits and impairments:  Abnormal gait, Decreased activity tolerance, Decreased balance, Decreased strength, Difficulty walking, Obesity, Decreased coordination  Visit Diagnosis: Difficulty in walking, not elsewhere classified  Muscle weakness (generalized)     Problem List Patient Active Problem List   Diagnosis Date Noted  . Cough 03/20/2015  . Bronchitis, chronic obstructive w acute bronchitis ([place]) 03/20/2015  . Umbilical hernia without obstruction and without gangrene 01/12/2015  . Ventral hernia without obstruction or gangrene 01/02/2015   Janna Arch, PT, DPT   08/09/2017, 3:57 PM  [place] MAIN [facility] SERVICES [address] [place], [place], [address] Phone: [phone]   Fax:  [phone]  Name: Jabri Blancett. MRN: [id number] Date of Birth: 1941/01/15

## 2017-08-15 ENCOUNTER — Ambulatory Visit: Payer: PPO | Attending: Internal Medicine

## 2017-08-15 VITALS — BP 143/68 | HR 68

## 2017-08-15 DIAGNOSIS — R262 Difficulty in walking, not elsewhere classified: Secondary | ICD-10-CM | POA: Insufficient documentation

## 2017-08-15 DIAGNOSIS — M6281 Muscle weakness (generalized): Secondary | ICD-10-CM | POA: Insufficient documentation

## 2017-08-15 NOTE — Therapy (Signed)
[place] MAIN [facility] SERVICES [address] [place], [place], [address] Phone: [phone]   Fax:  [phone]  Physical Therapy Treatment  Patient Details  Name: Daniel Eaton. MRN: [id number] Date of Birth: 08-05-1940 No data recorded  Encounter Date: 08/15/2017  PT End of Session - 08/15/17 1320    Visit Number  71    Number of Visits  77    Date for PT Re-Evaluation  09/20/17    Authorization Type  1/10    PT Start Time  1307    PT Stop Time  1345    PT Time Calculation (min)  38 min    Equipment Utilized During Treatment  Gait belt min guard to S prn    Activity Tolerance  Patient tolerated treatment well;Patient limited by fatigue    Behavior During Therapy  WFL for tasks assessed/performed;Anxious       Past Medical History:  Diagnosis Date  . A-fib ([place])   . CHF (congestive heart failure) ([place])   . COPD (chronic obstructive pulmonary disease) ([place])   . Difficult intubation   . Hyperlipidemia   . Hypertension   . Hypothyroidism   . Kidney disease   . Sleep apnea   . Stroke ([place])   . Thyroid disease     Past Surgical History:  Procedure Laterality Date  . CARDIAC CATHETERIZATION    . CATARACT EXTRACTION W/ INTRAOCULAR LENS  IMPLANT, BILATERAL    . COLONOSCOPY  2008  . HERNIA REPAIR     bilateral inguinal hernia/ [place]  . HERNIA REPAIR  02/02/2015   18 x 28 cm ventral light mesh placed laparoscopically.  . TONSILLECTOMY    . UMBILICAL HERNIA REPAIR N/A 02/02/2015   Procedure: HERNIA REPAIR UMBILICAL ADULT;  Surgeon: Robert Bellow, MD;  Location: [facility] ORS;  Service: General;  Laterality: N/A;  . VENTRAL HERNIA REPAIR N/A 02/02/2015   Procedure: LAPAROSCOPIC VENTRAL HERNIA;  Surgeon: Robert Bellow, MD;  Location: [facility] ORS;  Service: General;  Laterality: N/A;  . vp shunt placement  1979    Vitals:   08/15/17 1313  BP: (!) 143/68  Pulse: 68    Subjective Assessment - 08/15/17 1313    Subjective   Patient reports feeling tired today but doing better than last week. Having some increased swelling in LE's due to poor diet choice over the weekend.     Pertinent History  Pt reports a history of rapid onset balance difficulty since 2014. He reports that the initial episode occurred due to improper use of diuretic with hypokalemia. One year later he had another episode with a fall. He was sent to ENT and had a VNG study which was normal. ENT also ordered a head CT and MRI. MRI showed chronic R MCA infarct. Pt comes for [employer] 2-3x/wk. He has completed pulmonary rehab at [facility] twice. Pt denies dizziness or vertigo but reports difficulty with his balance     Limitations  Walking    Patient Stated Goals  Improve balance so that he only needs cane or no assistive device    Currently in Pain?  No/denies       Ambulate 92 ft x4 trials without AD; improved arm swing; last attempt performed two consecutive laps with occasional standing rest breaks to breath second lap.    2000 G weighted ball overhead raises for core and postural strengthening : terminated after 10 due to low back pain   2000 G weighted ball  rotational core activation 10x   10x STS from low plinth chair with hands on knees. ; challenge with eccentric portion with weight shift onto RLE rather than equal weight shift between feet. Had to focus on shifting to L with repetition.    Seated adduction squeeze 10x with 3 second holds.;   Seated scapular squeezes with purse lipped breathing.     TrA contraction seated with marching 10x. Each leg, cues abdomen activation   Pt. response to medical necessity: Patient will continue to benefit from skilled physical therapy to improve strength and balance for improved ambulatory mechanics and quality of life.                        PT Education - 08/15/17 1320    Education provided  Yes    Education Details  exercise technique     Person(s) Educated  Patient     Methods  Explanation;Demonstration;Verbal cues    Comprehension  Verbalized understanding;Returned demonstration       PT Short Term Goals - 08/09/17 1529      PT SHORT TERM GOAL #1   Title  Patient will ambulate 90 ft without cane and without stopping to allow for increase in mobility and quality of life.     Baseline  6/19: walk 120 ft without rest break without AD     Time  2    Period  Weeks    Status  Achieved      PT SHORT TERM GOAL #2   Title  Patient will stand from standard height chair on first attempt to allow for improved mobility.     Baseline  able to perform 6/19; unable to perform today due to pain.     Time  2    Period  Weeks    Status  Partially Met        PT Long Term Goals - 08/09/17 1530      PT LONG TERM GOAL #1   Title  Pt will be independent with HEP in order to improve strength and balance in order to decrease fall risk and improve function at home and work.    Baseline  HEP compliant    Time  8    Period  Weeks    Status  Achieved      PT LONG TERM GOAL #2   Title  Pt will improve BERG to 48 points in order to demonstrate clinically significant improvement in balance.     Baseline  04/19/16: 38/56; 05/24/16: 44/56 06/16/16: 43/56 08/16/16: 47/56 7/31: 49/56; 11/07/16: 42/56 10/24: 45/56 11/21: 48/56    Time  8    Period  Weeks    Status  Achieved      PT LONG TERM GOAL #3   Title  Pt will decrease TUG to below 25 seconds in order to demonstrate decreased fall risk and improved LE strength       Baseline  04/19/16: 36.15 seconds; 05/24/16: 20sec; 06/16/16: 22.07 seconds;      Time  8    Period  Weeks    Status  Achieved      PT LONG TERM GOAL #4   Title  Pt will increase 10MWT by at least 0.13 m/s in order to demonstrate clinically significant improvement in community ambulation.     Baseline  04/19/16: 0.75 m/s; 05/24/2016 .833; 06/16/16: 11.4s = 0.88 m/s 7/3: 13 m =.66ms, 731: .714 9/4: .43; 11/07/16: 0.74 m/s 11/21: .  67 without AD 12/27: .69 m/s ,03/13/17 .  60 M/sec ; 2/11: .83 m/s  6/26: .45ms    Time  8    Period  Days    Status  Achieved      PT LONG TERM GOAL #5   Title  Pt will decrease TUG to below 20 seconds in order to demonstrate decreased fall risk and improved LE strength       Baseline  04/19/16: 36.15 seconds; 05/24/16: 20sec; 06/16/16: 22.07 seconds; 11/07/16: unable to perform today due to weakness with transfers 11/21: 30 seconds without AD; 03/03/17 Patient is not able to perform sit to stand today from low chair. , not able to assess  2/11: 28 seconds 4/11: 23 seconds 5/15: 23 without AD    Time  6    Period  Weeks    Status  Partially Met    Target Date  09/20/17      Additional Long Term Goals   Additional Long Term Goals  Yes      PT LONG TERM GOAL #6   Title  Patient will increase dynamic gait index score to >19/24 as to demonstrate reduced fall risk and improved dynamic gait balance for better safety with community/home ambulation.     Baseline  7/3: 14 8/7: 17/24; 11/07/16: 14/24 10/24: 14/24 12/27: 14/24; 03/13/17 14/24 2/11: 14/24; 5/15: 16/24 6/26: 16/24    Time  6    Period  Weeks    Status  On-going    Target Date  09/20/17      PT LONG TERM GOAL #7   Title  Patient will increase lower extremity functional scale to >40/80 to demonstrate improved functional mobility and increased tolerance with ADLs.     Baseline  6/26: 18/80    Time  6    Period  Weeks    Status  New    Target Date  09/20/17            Plan - 08/15/17 1334    Clinical Impression Statement  Patient requires more rest break for breathing due to increased challenge of breathing from changing weather.  Patients ambulation continues to improve in quality of movement without AD with cueing for arm swing for forward momentum rather than side to side. Patient will continue to benefit from skilled physical therapy to improve strength and balance for improved ambulatory mechanics and quality of life.    Rehab Potential  Good    Clinical Impairments  Affecting Rehab Potential  Positive: motivation; Negative: chronic balance dysfunction, chronic CVA    PT Frequency  1x / week    PT Duration  6 weeks    PT Treatment/Interventions  ADLs/Self Care Home Management;Cryotherapy;Moist Heat;Neuromuscular re-education;Gait training;Functional mobility training;Stair training;Therapeutic activities;Therapeutic exercise;Balance training;Manual techniques;Aquatic Therapy;Canalith Repostioning;Electrical Stimulation;Traction;Ultrasound;DME Instruction;Patient/family education;Passive range of motion;Energy conservation;Vestibular    PT Next Visit Plan  walking without AD, STS    PT Home Exercise Plan  As prescribed    Consulted and Agree with Plan of Care  Patient       Patient will benefit from skilled therapeutic intervention in order to improve the following deficits and impairments:  Abnormal gait, Decreased activity tolerance, Decreased balance, Decreased strength, Difficulty walking, Obesity, Decreased coordination  Visit Diagnosis: Difficulty in walking, not elsewhere classified  Muscle weakness (generalized)     Problem List Patient Active Problem List   Diagnosis Date Noted  . Cough 03/20/2015  . Bronchitis, chronic obstructive w acute bronchitis ([place] 03/20/2015  . Umbilical  hernia without obstruction and without gangrene 01/12/2015  . Ventral hernia without obstruction or gangrene 01/02/2015   Janna Arch, PT, DPT   08/15/2017, 1:45 PM  [place] MAIN [facility] SERVICES [address] [place], [place], [address] Phone: [phone]   Fax:  [phone]  Name: Daniel Eaton. MRN: [id number] Date of Birth: [birth year]

## 2017-08-23 ENCOUNTER — Ambulatory Visit: Payer: PPO

## 2017-08-23 DIAGNOSIS — R262 Difficulty in walking, not elsewhere classified: Secondary | ICD-10-CM | POA: Diagnosis not present

## 2017-08-23 DIAGNOSIS — M6281 Muscle weakness (generalized): Secondary | ICD-10-CM

## 2017-08-23 NOTE — Therapy (Signed)
[place] MAIN [facility] SERVICES [address] [place], [place], [address] Phone: [phone]   Fax:  [phone]  Physical Therapy Treatment  Patient Details  Name: Daniel Eaton. MRN: [id number] Date of Birth: 20-Dec-1940 No data recorded  Encounter Date: 08/23/2017  PT End of Session - 08/23/17 1502    Visit Number  72    Number of Visits  77    Date for PT Re-Evaluation  09/20/17    Authorization Type  2/10    PT Start Time  0347    PT Stop Time  1515    PT Time Calculation (min)  44 min    Equipment Utilized During Treatment  Gait belt min guard to S prn    Activity Tolerance  Patient tolerated treatment well;Patient limited by fatigue    Behavior During Therapy  WFL for tasks assessed/performed;Anxious       Past Medical History:  Diagnosis Date  . A-fib ([place])   . CHF (congestive heart failure) ([place])   . COPD (chronic obstructive pulmonary disease) ([place])   . Difficult intubation   . Hyperlipidemia   . Hypertension   . Hypothyroidism   . Kidney disease   . Sleep apnea   . Stroke ([place])   . Thyroid disease     Past Surgical History:  Procedure Laterality Date  . CARDIAC CATHETERIZATION    . CATARACT EXTRACTION W/ INTRAOCULAR LENS  IMPLANT, BILATERAL    . COLONOSCOPY  2008  . HERNIA REPAIR     bilateral inguinal hernia/ [facility]  . HERNIA REPAIR  02/02/2015   18 x 28 cm ventral light mesh placed laparoscopically.  . TONSILLECTOMY    . UMBILICAL HERNIA REPAIR N/A 02/02/2015   Procedure: HERNIA REPAIR UMBILICAL ADULT;  Surgeon: Robert Bellow, MD;  Location: [facility] ORS;  Service: General;  Laterality: N/A;  . VENTRAL HERNIA REPAIR N/A 02/02/2015   Procedure: LAPAROSCOPIC VENTRAL HERNIA;  Surgeon: Robert Bellow, MD;  Location: [facility] ORS;  Service: General;  Laterality: N/A;  . vp shunt placement  1979    There were no vitals filed for this visit.  Subjective Assessment - 08/23/17 1457    Subjective  Patient reports  his knees have been aching, he has been sitting for too long since his wife is out of town. Has been compliant with seated HEP not standing.     Pertinent History  Pt reports a history of rapid onset balance difficulty since 2014. He reports that the initial episode occurred due to improper use of diuretic with hypokalemia. One year later he had another episode with a fall. He was sent to ENT and had a VNG study which was normal. ENT also ordered a head CT and MRI. MRI showed chronic R MCA infarct. Pt comes for [employer] 2-3x/wk. He has completed pulmonary rehab at [facility] twice. Pt denies dizziness or vertigo but reports difficulty with his balance     Limitations  Walking    Patient Stated Goals  Improve balance so that he only needs cane or no assistive device    Currently in Pain?  Yes    Pain Score  4     Pain Location  Knee    Pain Orientation  Right;Left    Pain Descriptors / Indicators  Aching    Pain Type  Chronic pain    Pain Onset  More than a month ago    Pain Frequency  Intermittent  Side Step over half foam roller and back in // bars with decreasing UE support x10 each leg  Step up/down 6" step each leg 8x each leg; BUE support  Balloon pass/taps in // bars reaching inside and outside BOS 3 minutes  Ambulating backwards in // bars 2x length of bars CGA, cues for upright posture and increased hip extension. No UE support   Side stepping in // bars 4x length of bars ; no UE support    Seated gluteal squeezes 10x 3 second hold    Seated scapular squeezes with purse lipped breathing.     TrA contraction seated  10x. 3 second hold   Seated heel toe raises 10x    Pt. response to medical necessity: Patient will continue to benefit from skilled physical therapy to improve strength and balance for improved ambulatory mechanics and quality of life.                           PT Education - 08/23/17 1458    Education provided  Yes    Education  Details  exercise technique, step over technique, step up technique     Person(s) Educated  Patient    Methods  Explanation;Demonstration;Verbal cues    Comprehension  Verbalized understanding;Returned demonstration       PT Short Term Goals - 08/09/17 1529      PT SHORT TERM GOAL #1   Title  Patient will ambulate 90 ft without cane and without stopping to allow for increase in mobility and quality of life.     Baseline  6/19: walk 120 ft without rest break without AD     Time  2    Period  Weeks    Status  Achieved      PT SHORT TERM GOAL #2   Title  Patient will stand from standard height chair on first attempt to allow for improved mobility.     Baseline  able to perform 6/19; unable to perform today due to pain.     Time  2    Period  Weeks    Status  Partially Met        PT Long Term Goals - 08/09/17 1530      PT LONG TERM GOAL #1   Title  Pt will be independent with HEP in order to improve strength and balance in order to decrease fall risk and improve function at home and work.    Baseline  HEP compliant    Time  8    Period  Weeks    Status  Achieved      PT LONG TERM GOAL #2   Title  Pt will improve BERG to 48 points in order to demonstrate clinically significant improvement in balance.     Baseline  04/19/16: 38/56; 05/24/16: 44/56 06/16/16: 43/56 08/16/16: 47/56 7/31: 49/56; 11/07/16: 42/56 10/24: 45/56 11/21: 48/56    Time  8    Period  Weeks    Status  Achieved      PT LONG TERM GOAL #3   Title  Pt will decrease TUG to below 25 seconds in order to demonstrate decreased fall risk and improved LE strength       Baseline  04/19/16: 36.15 seconds; 05/24/16: 20sec; 06/16/16: 22.07 seconds;      Time  8    Period  Weeks    Status  Achieved      PT LONG TERM GOAL #4  Title  Pt will increase 10MWT by at least 0.13 m/s in order to demonstrate clinically significant improvement in community ambulation.     Baseline  04/19/16: 0.75 m/s; 05/24/2016 .833; 06/16/16: 11.4s = 0.88 m/s  7/3: 13 m =.12ms, 731: .714 9/4: .43; 11/07/16: 0.74 m/s 11/21: . 67 without AD 12/27: .69 m/s ,03/13/17 . 60 M/sec ; 2/11: .83 m/s  6/26: .740m    Time  8    Period  Days    Status  Achieved      PT LONG TERM GOAL #5   Title  Pt will decrease TUG to below 20 seconds in order to demonstrate decreased fall risk and improved LE strength       Baseline  04/19/16: 36.15 seconds; 05/24/16: 20sec; 06/16/16: 22.07 seconds; 11/07/16: unable to perform today due to weakness with transfers 11/21: 30 seconds without AD; 03/03/17 Patient is not able to perform sit to stand today from low chair. , not able to assess  2/11: 28 seconds 4/11: 23 seconds 5/15: 23 without AD    Time  6    Period  Weeks    Status  Partially Met    Target Date  09/20/17      Additional Long Term Goals   Additional Long Term Goals  Yes      PT LONG TERM GOAL #6   Title  Patient will increase dynamic gait index score to >19/24 as to demonstrate reduced fall risk and improved dynamic gait balance for better safety with community/home ambulation.     Baseline  7/3: 14 8/7: 17/24; 11/07/16: 14/24 10/24: 14/24 12/27: 14/24; 03/13/17 14/24 2/11: 14/24; 5/15: 16/24 6/26: 16/24    Time  6    Period  Weeks    Status  On-going    Target Date  09/20/17      PT LONG TERM GOAL #7   Title  Patient will increase lower extremity functional scale to >40/80 to demonstrate improved functional mobility and increased tolerance with ADLs.     Baseline  6/26: 18/80    Time  6    Period  Weeks    Status  New    Target Date  09/20/17            Plan - 08/23/17 1504    Clinical Impression Statement  Patient progressing to dynamic step over/step up activities in // bars with heavy reliance upon UE's for support and fear of LOB. Knee pain increased with prolonged standing duration. Patient has limited single leg stability in horizontal plane of motion due to LE strength and balance. Patient will continue to benefit from skilled physical therapy to  improve strength and balance for improved ambulatory mechanics and quality of life.    Rehab Potential  Good    Clinical Impairments Affecting Rehab Potential  Positive: motivation; Negative: chronic balance dysfunction, chronic CVA    PT Frequency  1x / week    PT Duration  6 weeks    PT Treatment/Interventions  ADLs/Self Care Home Management;Cryotherapy;Moist Heat;Neuromuscular re-education;Gait training;Functional mobility training;Stair training;Therapeutic activities;Therapeutic exercise;Balance training;Manual techniques;Aquatic Therapy;Canalith Repostioning;Electrical Stimulation;Traction;Ultrasound;DME Instruction;Patient/family education;Passive range of motion;Energy conservation;Vestibular    PT Next Visit Plan  walking without AD, STS    PT Home Exercise Plan  As prescribed    Consulted and Agree with Plan of Care  Patient       Patient will benefit from skilled therapeutic intervention in order to improve the following deficits and impairments:  Abnormal gait, Decreased activity  tolerance, Decreased balance, Decreased strength, Difficulty walking, Obesity, Decreased coordination  Visit Diagnosis: Difficulty in walking, not elsewhere classified  Muscle weakness (generalized)     Problem List Patient Active Problem List   Diagnosis Date Noted  . Cough 03/20/2015  . Bronchitis, chronic obstructive w acute bronchitis ([place]) 03/20/2015  . Umbilical hernia without obstruction and without gangrene 01/12/2015  . Ventral hernia without obstruction or gangrene 01/02/2015   Janna Arch, PT, DPT   08/23/2017, 3:17 PM  [place] MAIN [facility] SERVICES [address] [place], [place], [address] Phone: [phone]   Fax:  [phone]  Name: Krishon Adkison. MRN: [id number] Date of Birth: 18-Nov-1940

## 2017-08-26 DIAGNOSIS — G4733 Obstructive sleep apnea (adult) (pediatric): Secondary | ICD-10-CM | POA: Diagnosis not present

## 2017-08-30 ENCOUNTER — Ambulatory Visit: Payer: PPO

## 2017-09-04 DIAGNOSIS — G4733 Obstructive sleep apnea (adult) (pediatric): Secondary | ICD-10-CM | POA: Diagnosis not present

## 2017-09-05 ENCOUNTER — Ambulatory Visit: Payer: PPO

## 2017-09-06 ENCOUNTER — Ambulatory Visit: Payer: PPO

## 2017-09-06 DIAGNOSIS — R262 Difficulty in walking, not elsewhere classified: Secondary | ICD-10-CM | POA: Diagnosis not present

## 2017-09-06 DIAGNOSIS — M6281 Muscle weakness (generalized): Secondary | ICD-10-CM

## 2017-09-06 NOTE — Therapy (Signed)
[place] MAIN [facility] SERVICES [address] [place], [place], [address] Phone: [phone]   Fax:  [phone]  Physical Therapy Treatment  Patient Details  Name: Daniel Eaton. MRN: [id number] Date of Birth: Oct 01, 1940 No data recorded  Encounter Date: 09/06/2017  PT End of Session - 09/06/17 1411    Visit Number  73    Number of Visits  77    Date for PT Re-Evaluation  09/20/17    Authorization Type  3/10    PT Start Time  5681    PT Stop Time  1432    PT Time Calculation (min)  39 min    Equipment Utilized During Treatment  Gait belt min guard to S prn    Activity Tolerance  Patient tolerated treatment well;Patient limited by fatigue    Behavior During Therapy  WFL for tasks assessed/performed;Anxious       Past Medical History:  Diagnosis Date  . A-fib ([place])   . CHF (congestive heart failure) ([place])   . COPD (chronic obstructive pulmonary disease) ([place])   . Difficult intubation   . Hyperlipidemia   . Hypertension   . Hypothyroidism   . Kidney disease   . Sleep apnea   . Stroke ([place])   . Thyroid disease     Past Surgical History:  Procedure Laterality Date  . CARDIAC CATHETERIZATION    . CATARACT EXTRACTION W/ INTRAOCULAR LENS  IMPLANT, BILATERAL    . COLONOSCOPY  2008  . HERNIA REPAIR     bilateral inguinal hernia/ [facility]  . HERNIA REPAIR  02/02/2015   18 x 28 cm ventral light mesh placed laparoscopically.  . TONSILLECTOMY    . UMBILICAL HERNIA REPAIR N/A 02/02/2015   Procedure: HERNIA REPAIR UMBILICAL ADULT;  Surgeon: Robert Bellow, MD;  Location: [facility] ORS;  Service: General;  Laterality: N/A;  . VENTRAL HERNIA REPAIR N/A 02/02/2015   Procedure: LAPAROSCOPIC VENTRAL HERNIA;  Surgeon: Robert Bellow, MD;  Location: [facility] ORS;  Service: General;  Laterality: N/A;  . vp shunt placement  1979    There were no vitals filed for this visit.  Subjective Assessment - 09/06/17 1359    Subjective  Patient hurt his  whole body getting in and out of his wife's car. Was in severe pain, went to chiropractor and is getting better. Still having R knee pain.     Pertinent History  Pt reports a history of rapid onset balance difficulty since 2014. He reports that the initial episode occurred due to improper use of diuretic with hypokalemia. One year later he had another episode with a fall. He was sent to ENT and had a VNG study which was normal. ENT also ordered a head CT and MRI. MRI showed chronic R MCA infarct. Pt comes for [employer] 2-3x/wk. He has completed pulmonary rehab at [facility] twice. Pt denies dizziness or vertigo but reports difficulty with his balance     Limitations  Walking    Patient Stated Goals  Improve balance so that he only needs cane or no assistive device    Currently in Pain?  Yes    Pain Score  5     Pain Location  Knee    Pain Orientation  Right    Pain Descriptors / Indicators  Stabbing    Pain Type  Acute pain    Pain Onset  1 to 4 weeks ago    Pain Frequency  Intermittent  Side Step over half foam roller and back in // bars with BUE support x10 each leg      Balloon pass/taps in // bars reaching inside and outside BOS 3 minutes   Ambulating backwards in // bars 2x length of bars CGA, cues for upright posture and increased hip extension. No UE support    Side stepping in // bars 6x length of bars ; no UE support    Seated LAQ10x 3 seconds: terminated due to pain.   Seated gluteal squeezes 10x 3 second hold    Seated scapular squeezes with purse lipped breathing.     TrA contraction seated  10x. 3 second hold    Standing marches 10x each leg BUE support- terminated due to pain  GTB resisted pf 10x each leg  GTB adduction 10x each leg, PT holding other end of band     Pt. response to medical necessity: Patient will continue to benefit from skilled physical therapy to improve strength and balance for improved ambulatory mechanics and quality of life.                           PT Education - 09/06/17 1410    Education provided  Yes    Education Details  exercise technique,     Person(s) Educated  Patient    Methods  Explanation;Demonstration;Verbal cues    Comprehension  Verbalized understanding;Returned demonstration       PT Short Term Goals - 08/09/17 1529      PT SHORT TERM GOAL #1   Title  Patient will ambulate 90 ft without cane and without stopping to allow for increase in mobility and quality of life.     Baseline  6/19: walk 120 ft without rest break without AD     Time  2    Period  Weeks    Status  Achieved      PT SHORT TERM GOAL #2   Title  Patient will stand from standard height chair on first attempt to allow for improved mobility.     Baseline  able to perform 6/19; unable to perform today due to pain.     Time  2    Period  Weeks    Status  Partially Met        PT Long Term Goals - 08/09/17 1530      PT LONG TERM GOAL #1   Title  Pt will be independent with HEP in order to improve strength and balance in order to decrease fall risk and improve function at home and work.    Baseline  HEP compliant    Time  8    Period  Weeks    Status  Achieved      PT LONG TERM GOAL #2   Title  Pt will improve BERG to 48 points in order to demonstrate clinically significant improvement in balance.     Baseline  04/19/16: 38/56; 05/24/16: 44/56 06/16/16: 43/56 08/16/16: 47/56 7/31: 49/56; 11/07/16: 42/56 10/24: 45/56 11/21: 48/56    Time  8    Period  Weeks    Status  Achieved      PT LONG TERM GOAL #3   Title  Pt will decrease TUG to below 25 seconds in order to demonstrate decreased fall risk and improved LE strength       Baseline  04/19/16: 36.15 seconds; 05/24/16: 20sec; 06/16/16: 22.07 seconds;      Time  8    Period  Weeks    Status  Achieved      PT LONG TERM GOAL #4   Title  Pt will increase 10MWT by at least 0.13 m/s in order to demonstrate clinically significant improvement in community  ambulation.     Baseline  04/19/16: 0.75 m/s; 05/24/2016 .833; 06/16/16: 11.4s = 0.88 m/s 7/3: 13 m =.35ms, 731: .714 9/4: .43; 11/07/16: 0.74 m/s 11/21: . 67 without AD 12/27: .69 m/s ,03/13/17 . 60 M/sec ; 2/11: .83 m/s  6/26: .760m    Time  8    Period  Days    Status  Achieved      PT LONG TERM GOAL #5   Title  Pt will decrease TUG to below 20 seconds in order to demonstrate decreased fall risk and improved LE strength       Baseline  04/19/16: 36.15 seconds; 05/24/16: 20sec; 06/16/16: 22.07 seconds; 11/07/16: unable to perform today due to weakness with transfers 11/21: 30 seconds without AD; 03/03/17 Patient is not able to perform sit to stand today from low chair. , not able to assess  2/11: 28 seconds 4/11: 23 seconds 5/15: 23 without AD    Time  6    Period  Weeks    Status  Partially Met    Target Date  09/20/17      Additional Long Term Goals   Additional Long Term Goals  Yes      PT LONG TERM GOAL #6   Title  Patient will increase dynamic gait index score to >19/24 as to demonstrate reduced fall risk and improved dynamic gait balance for better safety with community/home ambulation.     Baseline  7/3: 14 8/7: 17/24; 11/07/16: 14/24 10/24: 14/24 12/27: 14/24; 03/13/17 14/24 2/11: 14/24; 5/15: 16/24 6/26: 16/24    Time  6    Period  Weeks    Status  On-going    Target Date  09/20/17      PT LONG TERM GOAL #7   Title  Patient will increase lower extremity functional scale to >40/80 to demonstrate improved functional mobility and increased tolerance with ADLs.     Baseline  6/26: 18/80    Time  6    Period  Weeks    Status  New    Target Date  09/20/17            Plan - 09/06/17 1421    Clinical Impression Statement  Patient presents with right knee pain and instability. Frequent rest breaks required due to fatigue and pain. Increased utilization of UE's due to LE pain and discomfort throughout session.  Patient will continue to benefit from skilled physical therapy to improve  strength and balance for improved ambulatory mechanics and quality of life.    Rehab Potential  Good    Clinical Impairments Affecting Rehab Potential  Positive: motivation; Negative: chronic balance dysfunction, chronic CVA    PT Frequency  1x / week    PT Duration  6 weeks    PT Treatment/Interventions  ADLs/Self Care Home Management;Cryotherapy;Moist Heat;Neuromuscular re-education;Gait training;Functional mobility training;Stair training;Therapeutic activities;Therapeutic exercise;Balance training;Manual techniques;Aquatic Therapy;Canalith Repostioning;Electrical Stimulation;Traction;Ultrasound;DME Instruction;Patient/family education;Passive range of motion;Energy conservation;Vestibular    PT Next Visit Plan  walking without AD, STS    PT Home Exercise Plan  As prescribed    Consulted and Agree with Plan of Care  Patient       Patient will benefit from skilled therapeutic intervention in order to improve the  following deficits and impairments:  Abnormal gait, Decreased activity tolerance, Decreased balance, Decreased strength, Difficulty walking, Obesity, Decreased coordination  Visit Diagnosis: Difficulty in walking, not elsewhere classified  Muscle weakness (generalized)     Problem List Patient Active Problem List   Diagnosis Date Noted  . Cough 03/20/2015  . Bronchitis, chronic obstructive w acute bronchitis ([place]) 03/20/2015  . Umbilical hernia without obstruction and without gangrene 01/12/2015  . Ventral hernia without obstruction or gangrene 01/02/2015   Janna Arch, PT, DPT   09/06/2017, 2:36 PM  [place] MAIN [facility] SERVICES [address] [place], [place], [address] Phone: [phone]   Fax:  [phone]  Name: Daniel Eaton. MRN: [id number] Date of Birth: Apr 17, 1940

## 2017-09-13 ENCOUNTER — Ambulatory Visit: Payer: PPO

## 2017-09-13 DIAGNOSIS — R262 Difficulty in walking, not elsewhere classified: Secondary | ICD-10-CM

## 2017-09-13 DIAGNOSIS — M6281 Muscle weakness (generalized): Secondary | ICD-10-CM

## 2017-09-13 NOTE — Therapy (Signed)
[place] MAIN [facility] SERVICES [address] [place], [place], [address] Phone: [phone]   Fax:  [phone]  Physical Therapy Treatment  Patient Details  Name: Daniel Eaton. MRN: [id number] Date of Birth: 1940-12-19 No data recorded  Encounter Date: 09/13/2017  PT End of Session - 09/13/17 1409    Visit Number  65    Number of Visits  77    Date for PT Re-Evaluation  09/20/17    Authorization Type  4/10    PT Start Time  1350    PT Stop Time  1430    PT Time Calculation (min)  40 min    Equipment Utilized During Treatment  Gait belt min guard to S prn    Activity Tolerance  Patient tolerated treatment well;Patient limited by fatigue    Behavior During Therapy  WFL for tasks assessed/performed;Anxious       Past Medical History:  Diagnosis Date  . A-fib ([place])   . CHF (congestive heart failure) ([place])   . COPD (chronic obstructive pulmonary disease) ([place])   . Difficult intubation   . Hyperlipidemia   . Hypertension   . Hypothyroidism   . Kidney disease   . Sleep apnea   . Stroke ([place])   . Thyroid disease     Past Surgical History:  Procedure Laterality Date  . CARDIAC CATHETERIZATION    . CATARACT EXTRACTION W/ INTRAOCULAR LENS  IMPLANT, BILATERAL    . COLONOSCOPY  2008  . HERNIA REPAIR     bilateral inguinal hernia/ [place]  . HERNIA REPAIR  02/02/2015   18 x 28 cm ventral light mesh placed laparoscopically.  . TONSILLECTOMY    . UMBILICAL HERNIA REPAIR N/A 02/02/2015   Procedure: HERNIA REPAIR UMBILICAL ADULT;  Surgeon: Robert Bellow, MD;  Location: [facility] ORS;  Service: General;  Laterality: N/A;  . VENTRAL HERNIA REPAIR N/A 02/02/2015   Procedure: LAPAROSCOPIC VENTRAL HERNIA;  Surgeon: Robert Bellow, MD;  Location: [facility] ORS;  Service: General;  Laterality: N/A;  . vp shunt placement  1979    There were no vitals filed for this visit.  Subjective Assessment - 09/13/17 1355    Subjective  Patient reports  feeling very stiff and in pain in his knees. Has been having difficulty breathign and sleeping lately.     Pertinent History  Pt reports a history of rapid onset balance difficulty since 2014. He reports that the initial episode occurred due to improper use of diuretic with hypokalemia. One year later he had another episode with a fall. He was sent to ENT and had a VNG study which was normal. ENT also ordered a head CT and MRI. MRI showed chronic R MCA infarct. Pt comes for [employer] 2-3x/wk. He has completed pulmonary rehab at [facility] twice. Pt denies dizziness or vertigo but reports difficulty with his balance     Limitations  Walking    Patient Stated Goals  Improve balance so that he only needs cane or no assistive device    Currently in Pain?  Yes    Pain Score  5     Pain Location  Knee    Pain Orientation  Right    Pain Descriptors / Indicators  Stabbing    Pain Type  Acute pain    Pain Onset  1 to 4 weeks ago    Pain Frequency  Intermittent            Balloon pass/taps in //  bars reaching inside and outside BOS 5 minutes  Airex pad: static balance with decreased UE support    Hip Abduction BUE support 11 x each leg     Seated LAQ10x 3 seconds: stop before full extension to decrease pain.    Seated gluteal squeezes 10x 3 second hold    Seated scapular squeezes with purse lipped breathing.     TrA contraction seated  10x. 3 second hold    GTB resisted pf 10x each leg   GTB adduction 10x each leg, PT holding other end of band     Pt. response to medical necessity: Patient will continue to benefit from skilled physical therapy to improve strength and balance for improved ambulatory mechanics and quality of life                        PT Education - 09/13/17 1408    Education provided  Yes    Education Details  exercise technique     Person(s) Educated  Patient    Methods  Explanation;Demonstration;Verbal cues    Comprehension  Verbalized  understanding;Returned demonstration       PT Short Term Goals - 08/09/17 1529      PT SHORT TERM GOAL #1   Title  Patient will ambulate 90 ft without cane and without stopping to allow for increase in mobility and quality of life.     Baseline  6/19: walk 120 ft without rest break without AD     Time  2    Period  Weeks    Status  Achieved      PT SHORT TERM GOAL #2   Title  Patient will stand from standard height chair on first attempt to allow for improved mobility.     Baseline  able to perform 6/19; unable to perform today due to pain.     Time  2    Period  Weeks    Status  Partially Met        PT Long Term Goals - 08/09/17 1530      PT LONG TERM GOAL #1   Title  Pt will be independent with HEP in order to improve strength and balance in order to decrease fall risk and improve function at home and work.    Baseline  HEP compliant    Time  8    Period  Weeks    Status  Achieved      PT LONG TERM GOAL #2   Title  Pt will improve BERG to 48 points in order to demonstrate clinically significant improvement in balance.     Baseline  04/19/16: 38/56; 05/24/16: 44/56 06/16/16: 43/56 08/16/16: 47/56 7/31: 49/56; 11/07/16: 42/56 10/24: 45/56 11/21: 48/56    Time  8    Period  Weeks    Status  Achieved      PT LONG TERM GOAL #3   Title  Pt will decrease TUG to below 25 seconds in order to demonstrate decreased fall risk and improved LE strength       Baseline  04/19/16: 36.15 seconds; 05/24/16: 20sec; 06/16/16: 22.07 seconds;      Time  8    Period  Weeks    Status  Achieved      PT LONG TERM GOAL #4   Title  Pt will increase 10MWT by at least 0.13 m/s in order to demonstrate clinically significant improvement in community ambulation.     Baseline  04/19/16:  0.75 m/s; 05/24/2016 .833; 06/16/16: 11.4s = 0.88 m/s 7/3: 13 m =.31ms, 731: .714 9/4: .43; 11/07/16: 0.74 m/s 11/21: . 67 without AD 12/27: .69 m/s ,03/13/17 . 60 M/sec ; 2/11: .83 m/s  6/26: .789m    Time  8    Period  Days     Status  Achieved      PT LONG TERM GOAL #5   Title  Pt will decrease TUG to below 20 seconds in order to demonstrate decreased fall risk and improved LE strength       Baseline  04/19/16: 36.15 seconds; 05/24/16: 20sec; 06/16/16: 22.07 seconds; 11/07/16: unable to perform today due to weakness with transfers 11/21: 30 seconds without AD; 03/03/17 Patient is not able to perform sit to stand today from low chair. , not able to assess  2/11: 28 seconds 4/11: 23 seconds 5/15: 23 without AD    Time  6    Period  Weeks    Status  Partially Met    Target Date  09/20/17      Additional Long Term Goals   Additional Long Term Goals  Yes      PT LONG TERM GOAL #6   Title  Patient will increase dynamic gait index score to >19/24 as to demonstrate reduced fall risk and improved dynamic gait balance for better safety with community/home ambulation.     Baseline  7/3: 14 8/7: 17/24; 11/07/16: 14/24 10/24: 14/24 12/27: 14/24; 03/13/17 14/24 2/11: 14/24; 5/15: 16/24 6/26: 16/24    Time  6    Period  Weeks    Status  On-going    Target Date  09/20/17      PT LONG TERM GOAL #7   Title  Patient will increase lower extremity functional scale to >40/80 to demonstrate improved functional mobility and increased tolerance with ADLs.     Baseline  6/26: 18/80    Time  6    Period  Weeks    Status  New    Target Date  09/20/17            Plan - 09/13/17 1421    Clinical Impression Statement  Patient's right knee pain decreased stability of patient resulting in increased fatigue and more frequent rest breaks. Dynamic standing strengthening interventions increased pain reducing standing interventions. Patient will continue to benefit from skilled physical therapy to improve strength and balance for improved ambulatory mechanics and quality of life    Rehab Potential  Good    Clinical Impairments Affecting Rehab Potential  Positive: motivation; Negative: chronic balance dysfunction, chronic CVA    PT Frequency  1x  / week    PT Duration  6 weeks    PT Treatment/Interventions  ADLs/Self Care Home Management;Cryotherapy;Moist Heat;Neuromuscular re-education;Gait training;Functional mobility training;Stair training;Therapeutic activities;Therapeutic exercise;Balance training;Manual techniques;Aquatic Therapy;Canalith Repostioning;Electrical Stimulation;Traction;Ultrasound;DME Instruction;Patient/family education;Passive range of motion;Energy conservation;Vestibular    PT Next Visit Plan  walking without AD, STS    PT Home Exercise Plan  As prescribed    Consulted and Agree with Plan of Care  Patient       Patient will benefit from skilled therapeutic intervention in order to improve the following deficits and impairments:  Abnormal gait, Decreased activity tolerance, Decreased balance, Decreased strength, Difficulty walking, Obesity, Decreased coordination  Visit Diagnosis: Difficulty in walking, not elsewhere classified  Muscle weakness (generalized)     Problem List Patient Active Problem List   Diagnosis Date Noted  . Cough 03/20/2015  . Bronchitis, chronic obstructive w  acute bronchitis ([place]) 03/20/2015  . Umbilical hernia without obstruction and without gangrene 01/12/2015  . Ventral hernia without obstruction or gangrene 01/02/2015   Janna Arch, PT, DPT   09/13/2017, 2:30 PM  [place] MAIN [facility] SERVICES [address] [place], [place], [address] Phone: [phone]   Fax:  [phone]  Name: Dorean Daniello. MRN: [id number] Date of Birth: 1940-08-17

## 2017-09-20 ENCOUNTER — Ambulatory Visit: Payer: PPO | Attending: Internal Medicine

## 2017-09-20 DIAGNOSIS — R262 Difficulty in walking, not elsewhere classified: Secondary | ICD-10-CM

## 2017-09-20 DIAGNOSIS — M6281 Muscle weakness (generalized): Secondary | ICD-10-CM

## 2017-09-20 NOTE — Therapy (Signed)
[place] MAIN [facility] SERVICES [address] [place], [place], [address] Phone: [phone]   Fax:  [phone]  Physical Therapy Treatment  Patient Details  Name: Daniel Eaton. MRN: [id number] Date of Birth: 1940-11-12 No data recorded  Encounter Date: 09/20/2017  PT End of Session - 09/20/17 1447    Visit Number  75    Number of Visits  71    Date for PT Re-Evaluation  09/20/17    Authorization Type  5/10    PT Start Time  1352    PT Stop Time  1436    PT Time Calculation (min)  44 min    Equipment Utilized During Treatment  Gait belt min guard to S prn    Activity Tolerance  Patient tolerated treatment well;Patient limited by fatigue    Behavior During Therapy  WFL for tasks assessed/performed;Anxious       Past Medical History:  Diagnosis Date  . A-fib ([place])   . CHF (congestive heart failure) ([place])   . COPD (chronic obstructive pulmonary disease) ([place])   . Difficult intubation   . Hyperlipidemia   . Hypertension   . Hypothyroidism   . Kidney disease   . Sleep apnea   . Stroke ([place])   . Thyroid disease     Past Surgical History:  Procedure Laterality Date  . CARDIAC CATHETERIZATION    . CATARACT EXTRACTION W/ INTRAOCULAR LENS  IMPLANT, BILATERAL    . COLONOSCOPY  2008  . HERNIA REPAIR     bilateral inguinal hernia/ [place]  . HERNIA REPAIR  02/02/2015   18 x 28 cm ventral light mesh placed laparoscopically.  . TONSILLECTOMY    . UMBILICAL HERNIA REPAIR N/A 02/02/2015   Procedure: HERNIA REPAIR UMBILICAL ADULT;  Surgeon: Robert Bellow, MD;  Location: [facility] ORS;  Service: General;  Laterality: N/A;  . VENTRAL HERNIA REPAIR N/A 02/02/2015   Procedure: LAPAROSCOPIC VENTRAL HERNIA;  Surgeon: Robert Bellow, MD;  Location: [facility] ORS;  Service: General;  Laterality: N/A;  . vp shunt placement  1979    There were no vitals filed for this visit.  Subjective Assessment - 09/20/17 1445    Subjective  Patient reports  severe R knee pain continuing since problem getting out of car resulting in full body body. Pain in R knee limiting standing, walking, and transitioning.     Pertinent History  Pt reports a history of rapid onset balance difficulty since 2014. He reports that the initial episode occurred due to improper use of diuretic with hypokalemia. One year later he had another episode with a fall. He was sent to ENT and had a VNG study which was normal. ENT also ordered a head CT and MRI. MRI showed chronic R MCA infarct. Pt comes for [employer] 2-3x/wk. He has completed pulmonary rehab at [facility] twice. Pt denies dizziness or vertigo but reports difficulty with his balance     Limitations  Walking    Patient Stated Goals  Improve balance so that he only needs cane or no assistive device    Currently in Pain?  Yes    Pain Score  5     Pain Location  Knee    Pain Orientation  Right    Pain Descriptors / Indicators  Stabbing    Pain Type  Acute pain    Pain Onset  1 to 4 weeks ago    Pain Frequency  Constant  Attempt at sit to stand transfer from TUG chair to attempt goal performance. Unable to perform after 5 minutes of attempts with worsening R knee pain.   Assess of R knee:  pain in standing and sit to stand transition Slight swelling of lateral hamstring with  pain with palpation of Lateral hamstring  Pain with medial meniscus palpation ; knee extension and flexion   Unable to perform sit to stand without severe pain in R knee  Patient educated on need to return to doctor for potential imaging due to severe pain with transitions, weightbearing, and mobility.   GTB: hip abduction: 10x: 2 sets GTB hip adduction 10x; 2 sets  Ice cup massage R knee 8 minutes    Education: body positioning, reducing stress/tension on R knee; reducing shearing forces  On knee. Need for imaging. Steps for going to doctor. Icing R knee, hip strengthening without knee pain.                   PT  Education - 09/20/17 1447    Education provided  Yes    Education Details  need for imaging     Person(s) Educated  Patient    Methods  Explanation    Comprehension  Verbalized understanding       PT Short Term Goals - 08/09/17 1529      PT SHORT TERM GOAL #1   Title  Patient will ambulate 90 ft without cane and without stopping to allow for increase in mobility and quality of life.     Baseline  6/19: walk 120 ft without rest break without AD     Time  2    Period  Weeks    Status  Achieved      PT SHORT TERM GOAL #2   Title  Patient will stand from standard height chair on first attempt to allow for improved mobility.     Baseline  able to perform 6/19; unable to perform today due to pain.     Time  2    Period  Weeks    Status  Partially Met        PT Long Term Goals - 08/09/17 1530      PT LONG TERM GOAL #1   Title  Pt will be independent with HEP in order to improve strength and balance in order to decrease fall risk and improve function at home and work.    Baseline  HEP compliant    Time  8    Period  Weeks    Status  Achieved      PT LONG TERM GOAL #2   Title  Pt will improve BERG to 48 points in order to demonstrate clinically significant improvement in balance.     Baseline  04/19/16: 38/56; 05/24/16: 44/56 06/16/16: 43/56 08/16/16: 47/56 7/31: 49/56; 11/07/16: 42/56 10/24: 45/56 11/21: 48/56    Time  8    Period  Weeks    Status  Achieved      PT LONG TERM GOAL #3   Title  Pt will decrease TUG to below 25 seconds in order to demonstrate decreased fall risk and improved LE strength       Baseline  04/19/16: 36.15 seconds; 05/24/16: 20sec; 06/16/16: 22.07 seconds;      Time  8    Period  Weeks    Status  Achieved      PT LONG TERM GOAL #4   Title  Pt will increase 10MWT by  at least 0.13 m/s in order to demonstrate clinically significant improvement in community ambulation.     Baseline  04/19/16: 0.75 m/s; 05/24/2016 .833; 06/16/16: 11.4s = 0.88 m/s 7/3: 13 m =.69ms, 731:  .714 9/4: .43; 11/07/16: 0.74 m/s 11/21: . 67 without AD 12/27: .69 m/s ,03/13/17 . 60 M/sec ; 2/11: .83 m/s  6/26: .771m    Time  8    Period  Days    Status  Achieved      PT LONG TERM GOAL #5   Title  Pt will decrease TUG to below 20 seconds in order to demonstrate decreased fall risk and improved LE strength       Baseline  04/19/16: 36.15 seconds; 05/24/16: 20sec; 06/16/16: 22.07 seconds; 11/07/16: unable to perform today due to weakness with transfers 11/21: 30 seconds without AD; 03/03/17 Patient is not able to perform sit to stand today from low chair. , not able to assess  2/11: 28 seconds 4/11: 23 seconds 5/15: 23 without AD    Time  6    Period  Weeks    Status  Partially Met    Target Date  09/20/17      Additional Long Term Goals   Additional Long Term Goals  Yes      PT LONG TERM GOAL #6   Title  Patient will increase dynamic gait index score to >19/24 as to demonstrate reduced fall risk and improved dynamic gait balance for better safety with community/home ambulation.     Baseline  7/3: 14 8/7: 17/24; 11/07/16: 14/24 10/24: 14/24 12/27: 14/24; 03/13/17 14/24 2/11: 14/24; 5/15: 16/24 6/26: 16/24    Time  6    Period  Weeks    Status  On-going    Target Date  09/20/17      PT LONG TERM GOAL #7   Title  Patient will increase lower extremity functional scale to >40/80 to demonstrate improved functional mobility and increased tolerance with ADLs.     Baseline  6/26: 18/80    Time  6    Period  Weeks    Status  New    Target Date  09/20/17            Plan - 09/20/17 1452    Clinical Impression Statement  Patient's right knee continues to worsen, now limiting ability to perform sit to stands, ambulate, and stand. Goals were deferred from this session due to severe pain and patient encouraged to contact doctor for evaluation/imaging to ensure meniscal and ligamentous stability due to pain with weightbearing, palpation, and flexion/extension. Patient will continue to benefit from  skilled physical therapy to improve strength and balance for improved ambulatory mechanics and quality of life    Rehab Potential  Good    Clinical Impairments Affecting Rehab Potential  Positive: motivation; Negative: chronic balance dysfunction, chronic CVA    PT Frequency  1x / week    PT Duration  6 weeks    PT Treatment/Interventions  ADLs/Self Care Home Management;Cryotherapy;Moist Heat;Neuromuscular re-education;Gait training;Functional mobility training;Stair training;Therapeutic activities;Therapeutic exercise;Balance training;Manual techniques;Aquatic Therapy;Canalith Repostioning;Electrical Stimulation;Traction;Ultrasound;DME Instruction;Patient/family education;Passive range of motion;Energy conservation;Vestibular    PT Next Visit Plan  walking without AD, STS    PT Home Exercise Plan  As prescribed    Consulted and Agree with Plan of Care  Patient       Patient will benefit from skilled therapeutic intervention in order to improve the following deficits and impairments:  Abnormal gait, Decreased activity tolerance, Decreased balance, Decreased  strength, Difficulty walking, Obesity, Decreased coordination  Visit Diagnosis: Difficulty in walking, not elsewhere classified  Muscle weakness (generalized)     Problem List Patient Active Problem List   Diagnosis Date Noted  . Cough 03/20/2015  . Bronchitis, chronic obstructive w acute bronchitis ([place]) 03/20/2015  . Umbilical hernia without obstruction and without gangrene 01/12/2015  . Ventral hernia without obstruction or gangrene 01/02/2015   Janna Arch, PT, DPT   09/20/2017, 2:56 PM  [place] MAIN [facility] SERVICES [address] [place], [place], [address] Phone: [phone]   Fax:  [phone]  Name: Daniel Eaton. MRN: [id number] Date of Birth: 05/04/1940

## 2017-09-22 DIAGNOSIS — M1711 Unilateral primary osteoarthritis, right knee: Secondary | ICD-10-CM | POA: Diagnosis not present

## 2017-09-22 DIAGNOSIS — Z6841 Body Mass Index (BMI) 40.0 and over, adult: Secondary | ICD-10-CM | POA: Diagnosis not present

## 2017-09-22 DIAGNOSIS — M25561 Pain in right knee: Secondary | ICD-10-CM | POA: Diagnosis not present

## 2017-09-22 DIAGNOSIS — G8929 Other chronic pain: Secondary | ICD-10-CM | POA: Diagnosis not present

## 2017-09-25 ENCOUNTER — Ambulatory Visit: Payer: PPO

## 2017-09-26 DIAGNOSIS — G4733 Obstructive sleep apnea (adult) (pediatric): Secondary | ICD-10-CM | POA: Diagnosis not present

## 2017-10-02 ENCOUNTER — Ambulatory Visit: Payer: PPO

## 2017-10-02 DIAGNOSIS — M6281 Muscle weakness (generalized): Secondary | ICD-10-CM

## 2017-10-02 DIAGNOSIS — R262 Difficulty in walking, not elsewhere classified: Secondary | ICD-10-CM

## 2017-10-02 NOTE — Therapy (Signed)
[facility] SERVICES [address] [place], [place], [address] Phone: [phone]   Fax:  [phone]  Physical Therapy Treatment  Patient Details  Name: Daniel Eaton. MRN: [id number] Date of Birth: 05-21-1940 No data recorded  Encounter Date: 10/02/2017  PT End of Session - 10/02/17 1407    Visit Number  25    Number of Visits  77    Date for PT Re-Evaluation  10/02/17    Authorization Type  6/10    PT Start Time  1355    PT Stop Time  1425    PT Time Calculation (min)  30 min    Equipment Utilized During Treatment  Gait belt   min guard to S prn   Activity Tolerance  Patient tolerated treatment well;Patient limited by fatigue    Behavior During Therapy  WFL for tasks assessed/performed;Anxious       Past Medical History:  Diagnosis Date  . A-fib ([place])   . CHF (congestive heart failure) ([place])   . COPD (chronic obstructive pulmonary disease) ([place])   . Difficult intubation   . Hyperlipidemia   . Hypertension   . Hypothyroidism   . Kidney disease   . Sleep apnea   . Stroke ([place])   . Thyroid disease     Past Surgical History:  Procedure Laterality Date  . CARDIAC CATHETERIZATION    . CATARACT EXTRACTION W/ INTRAOCULAR LENS  IMPLANT, BILATERAL    . COLONOSCOPY  2008  . HERNIA REPAIR     bilateral inguinal hernia/ [place]  . HERNIA REPAIR  02/02/2015   18 x 28 cm ventral light mesh placed laparoscopically.  . TONSILLECTOMY    . UMBILICAL HERNIA REPAIR N/A 02/02/2015   Procedure: HERNIA REPAIR UMBILICAL ADULT;  Surgeon: Robert Bellow, MD;  Location: [facility] ORS;  Service: General;  Laterality: N/A;  . VENTRAL HERNIA REPAIR N/A 02/02/2015   Procedure: LAPAROSCOPIC VENTRAL HERNIA;  Surgeon: Robert Bellow, MD;  Location: [facility] ORS;  Service: General;  Laterality: N/A;  . vp shunt placement  1979    There were no vitals filed for this visit.  Subjective Assessment - 10/02/17 1405    Subjective  Patient reports  R knee pain is decreased due to finishing last dose of Prednisose. Went to doctor and had imaging of R knee which showed degenerative changes but no severe damage. Patient reports extreme soreness in bilateral knees.     Pertinent History  Pt reports a history of rapid onset balance difficulty since 2014. He reports that the initial episode occurred due to improper use of diuretic with hypokalemia. One year later he had another episode with a fall. He was sent to ENT and had a VNG study which was normal. ENT also ordered a head CT and MRI. MRI showed chronic R MCA infarct. Pt comes for [employer] 2-3x/wk. He has completed pulmonary rehab at [facility] twice. Pt denies dizziness or vertigo but reports difficulty with his balance     Limitations  Walking    Patient Stated Goals  Improve balance so that he only needs cane or no assistive device    Currently in Pain?  Yes    Pain Score  5     Pain Location  Knee    Pain Orientation  Right;Left    Pain Descriptors / Indicators  Aching;Sore    Pain Type  Chronic pain    Pain Onset  1 to 4 weeks ago  Pain Frequency  Constant    Aggravating Factors   walking    Pain Relieving Factors  sitting        Recert TUG: unable to perform. Attempted to get out of chair independently x 4 minutes without success  DGI: unable to perform. LEFS: 10/80   Patient finished last dosage of Prednisone two days ago.   Sit to stand: requires Mod A x 5 attempts to get out of chair at 90 90 knee degree level with increase in pain in bilateral knees.    Education on forever fit.   HEP review. : patient comprehends and verbalized understanding of importance of performance.                       PT Education - 10/02/17 1406    Education provided  Yes    Education Details  POC, d/c due to plateau     Person(s) Educated  Patient    Methods  Explanation    Comprehension  Verbalized understanding       PT Short Term Goals - 10/02/17 1409      PT  SHORT TERM GOAL #1   Title  Patient will ambulate 90 ft without cane and without stopping to allow for increase in mobility and quality of life.     Baseline  6/19: walk 120 ft without rest break without AD     Time  2    Period  Weeks    Status  Achieved      PT SHORT TERM GOAL #2   Title  Patient will stand from standard height chair on first attempt to allow for improved mobility.     Baseline  able to perform 6/19; unable to perform today due to pain.     Time  2    Period  Weeks    Status  Partially Met        PT Long Term Goals - 10/02/17 1409      PT LONG TERM GOAL #1   Title  Pt will be independent with HEP in order to improve strength and balance in order to decrease fall risk and improve function at home and work.    Baseline  HEP compliant    Time  8    Period  Weeks    Status  Achieved      PT LONG TERM GOAL #2   Title  Pt will improve BERG to 48 points in order to demonstrate clinically significant improvement in balance.     Baseline  04/19/16: 38/56; 05/24/16: 44/56 06/16/16: 43/56 08/16/16: 47/56 7/31: 49/56; 11/07/16: 42/56 10/24: 45/56 11/21: 48/56    Time  8    Period  Weeks    Status  Achieved      PT LONG TERM GOAL #3   Title  Pt will decrease TUG to below 25 seconds in order to demonstrate decreased fall risk and improved LE strength       Baseline  04/19/16: 36.15 seconds; 05/24/16: 20sec; 06/16/16: 22.07 seconds;      Time  8    Period  Weeks    Status  Achieved      PT LONG TERM GOAL #4   Title  Pt will increase 10MWT by at least 0.13 m/s in order to demonstrate clinically significant improvement in community ambulation.     Baseline  04/19/16: 0.75 m/s; 05/24/2016 .833; 06/16/16: 11.4s = 0.88 m/s 7/3: 13 m =.73ms, 731: .714 9/4: .  43; 11/07/16: 0.74 m/s 11/21: . 67 without AD 12/27: .69 m/s ,03/13/17 . 60 M/sec ; 2/11: .83 m/s  6/26: .34ms    Time  8    Period  Days    Status  Achieved      PT LONG TERM GOAL #5   Title  Pt will decrease TUG to below 20 seconds  in order to demonstrate decreased fall risk and improved LE strength       Baseline  04/19/16: 36.15 seconds; 05/24/16: 20sec; 06/16/16: 22.07 seconds; 11/07/16: unable to perform today due to weakness with transfers 11/21: 30 seconds without AD; 03/03/17 Patient is not able to perform sit to stand today from low chair. , not able to assess  2/11: 28 seconds 4/11: 23 seconds 5/15: 23 without AD 8/19: unable to perform     Time  6    Period  Weeks    Status  Not Met      PT LONG TERM GOAL #6   Title  Patient will increase dynamic gait index score to >19/24 as to demonstrate reduced fall risk and improved dynamic gait balance for better safety with community/home ambulation.     Baseline  7/3: 14 8/7: 17/24; 11/07/16: 14/24 10/24: 14/24 12/27: 14/24; 03/13/17 14/24 2/11: 14/24; 5/15: 16/24 6/26: 16/24; 8/19: unable to perform     Time  6    Period  Weeks    Status  Not Met      PT LONG TERM GOAL #7   Title  Patient will increase lower extremity functional scale to >40/80 to demonstrate improved functional mobility and increased tolerance with ADLs.     Baseline  6/26: 18/80 8/19: 10/80     Time  6    Period  Weeks    Status  Not Met            Plan - 10/02/17 1417    Clinical Impression Statement  Patient has reached plateau of goals for consecutive trials results in discharge. Patient options discussed with PT encouraging patient to join [facility]/administrator Patient agreed to [facility] I will be happy to see patient again in future as needed.     Rehab Potential  Good    Clinical Impairments Affecting Rehab Potential  Positive: motivation; Negative: chronic balance dysfunction, chronic CVA    PT Frequency  1x / week    PT Duration  6 weeks    PT Treatment/Interventions  ADLs/Self Care Home Management;Cryotherapy;Moist Heat;Neuromuscular re-education;Gait training;Functional mobility training;Stair training;Therapeutic activities;Therapeutic exercise;Balance training;Manual  techniques;Aquatic Therapy;Canalith Repostioning;Electrical Stimulation;Traction;Ultrasound;DME Instruction;Patient/family education;Passive range of motion;Energy conservation;Vestibular    PT Next Visit Plan  walking without AD, STS    PT Home Exercise Plan  As prescribed    Consulted and Agree with Plan of Care  Patient       Patient will benefit from skilled therapeutic intervention in order to improve the following deficits and impairments:  Abnormal gait, Decreased activity tolerance, Decreased balance, Decreased strength, Difficulty walking, Obesity, Decreased coordination  Visit Diagnosis: Difficulty in walking, not elsewhere classified  Muscle weakness (generalized)     Problem List Patient Active Problem List   Diagnosis Date Noted  . Cough 03/20/2015  . Bronchitis, chronic obstructive w acute bronchitis ([place] 03/20/2015  . Umbilical hernia without obstruction and without gangrene 01/12/2015  . Ventral hernia without obstruction or gangrene 01/02/2015   MJanna Arch PT, DPT   10/02/2017, 2:27 PM  [facility] [place], [place], [address] Phone: [phone]   Fax:  [phone]  Name: Daniel Eaton. MRN: [id number] Date of Birth: [birth year]

## 2017-10-09 ENCOUNTER — Ambulatory Visit: Payer: PPO

## 2017-10-17 ENCOUNTER — Ambulatory Visit: Payer: PPO

## 2017-10-23 ENCOUNTER — Ambulatory Visit: Payer: PPO

## 2017-10-27 DIAGNOSIS — G4733 Obstructive sleep apnea (adult) (pediatric): Secondary | ICD-10-CM | POA: Diagnosis not present

## 2017-10-30 ENCOUNTER — Ambulatory Visit: Payer: PPO

## 2017-11-06 ENCOUNTER — Ambulatory Visit: Payer: PPO

## 2017-11-06 DIAGNOSIS — B351 Tinea unguium: Secondary | ICD-10-CM | POA: Diagnosis not present

## 2017-11-06 DIAGNOSIS — M79675 Pain in left toe(s): Secondary | ICD-10-CM | POA: Diagnosis not present

## 2017-11-06 DIAGNOSIS — M79674 Pain in right toe(s): Secondary | ICD-10-CM | POA: Diagnosis not present

## 2017-11-09 DIAGNOSIS — S80812A Abrasion, left lower leg, initial encounter: Secondary | ICD-10-CM | POA: Diagnosis not present

## 2017-11-13 ENCOUNTER — Ambulatory Visit: Payer: PPO

## 2017-11-26 DIAGNOSIS — G4733 Obstructive sleep apnea (adult) (pediatric): Secondary | ICD-10-CM | POA: Diagnosis not present

## 2017-12-07 DIAGNOSIS — G4733 Obstructive sleep apnea (adult) (pediatric): Secondary | ICD-10-CM | POA: Diagnosis not present

## 2018-01-02 DIAGNOSIS — I429 Cardiomyopathy, unspecified: Secondary | ICD-10-CM | POA: Diagnosis not present

## 2018-01-02 DIAGNOSIS — I129 Hypertensive chronic kidney disease with stage 1 through stage 4 chronic kidney disease, or unspecified chronic kidney disease: Secondary | ICD-10-CM | POA: Diagnosis not present

## 2018-01-02 DIAGNOSIS — N183 Chronic kidney disease, stage 3 (moderate): Secondary | ICD-10-CM | POA: Diagnosis not present

## 2018-01-02 DIAGNOSIS — I482 Chronic atrial fibrillation, unspecified: Secondary | ICD-10-CM | POA: Diagnosis not present

## 2018-01-02 DIAGNOSIS — R739 Hyperglycemia, unspecified: Secondary | ICD-10-CM | POA: Diagnosis not present

## 2018-01-15 DIAGNOSIS — M79674 Pain in right toe(s): Secondary | ICD-10-CM | POA: Diagnosis not present

## 2018-01-15 DIAGNOSIS — M79675 Pain in left toe(s): Secondary | ICD-10-CM | POA: Diagnosis not present

## 2018-01-15 DIAGNOSIS — L03031 Cellulitis of right toe: Secondary | ICD-10-CM | POA: Diagnosis not present

## 2018-01-15 DIAGNOSIS — B351 Tinea unguium: Secondary | ICD-10-CM | POA: Diagnosis not present

## 2018-01-24 DIAGNOSIS — Z23 Encounter for immunization: Secondary | ICD-10-CM | POA: Diagnosis not present

## 2018-01-24 DIAGNOSIS — E039 Hypothyroidism, unspecified: Secondary | ICD-10-CM | POA: Diagnosis not present

## 2018-01-24 DIAGNOSIS — G4733 Obstructive sleep apnea (adult) (pediatric): Secondary | ICD-10-CM | POA: Diagnosis not present

## 2018-01-24 DIAGNOSIS — I129 Hypertensive chronic kidney disease with stage 1 through stage 4 chronic kidney disease, or unspecified chronic kidney disease: Secondary | ICD-10-CM | POA: Diagnosis not present

## 2018-01-24 DIAGNOSIS — J42 Unspecified chronic bronchitis: Secondary | ICD-10-CM | POA: Diagnosis not present

## 2018-01-24 DIAGNOSIS — I4891 Unspecified atrial fibrillation: Secondary | ICD-10-CM | POA: Diagnosis not present

## 2018-01-24 DIAGNOSIS — Z6841 Body Mass Index (BMI) 40.0 and over, adult: Secondary | ICD-10-CM | POA: Diagnosis not present

## 2018-01-24 DIAGNOSIS — N183 Chronic kidney disease, stage 3 (moderate): Secondary | ICD-10-CM | POA: Diagnosis not present

## 2018-01-24 DIAGNOSIS — I700 Atherosclerosis of aorta: Secondary | ICD-10-CM | POA: Diagnosis not present

## 2018-01-24 DIAGNOSIS — I429 Cardiomyopathy, unspecified: Secondary | ICD-10-CM | POA: Diagnosis not present

## 2018-01-24 DIAGNOSIS — R739 Hyperglycemia, unspecified: Secondary | ICD-10-CM | POA: Diagnosis not present

## 2018-03-01 DIAGNOSIS — R600 Localized edema: Secondary | ICD-10-CM | POA: Diagnosis not present

## 2018-03-01 DIAGNOSIS — I429 Cardiomyopathy, unspecified: Secondary | ICD-10-CM | POA: Diagnosis not present

## 2018-03-01 DIAGNOSIS — J449 Chronic obstructive pulmonary disease, unspecified: Secondary | ICD-10-CM | POA: Diagnosis not present

## 2018-03-01 DIAGNOSIS — J42 Unspecified chronic bronchitis: Secondary | ICD-10-CM | POA: Diagnosis not present

## 2018-03-01 DIAGNOSIS — R0602 Shortness of breath: Secondary | ICD-10-CM | POA: Diagnosis not present

## 2018-03-01 DIAGNOSIS — G4733 Obstructive sleep apnea (adult) (pediatric): Secondary | ICD-10-CM | POA: Diagnosis not present

## 2018-03-09 DIAGNOSIS — R0602 Shortness of breath: Secondary | ICD-10-CM | POA: Diagnosis not present

## 2018-03-13 DIAGNOSIS — G4733 Obstructive sleep apnea (adult) (pediatric): Secondary | ICD-10-CM | POA: Diagnosis not present

## 2018-03-27 DIAGNOSIS — R0609 Other forms of dyspnea: Secondary | ICD-10-CM | POA: Diagnosis not present

## 2018-03-27 DIAGNOSIS — G4733 Obstructive sleep apnea (adult) (pediatric): Secondary | ICD-10-CM | POA: Diagnosis not present

## 2018-03-27 DIAGNOSIS — Z6841 Body Mass Index (BMI) 40.0 and over, adult: Secondary | ICD-10-CM | POA: Diagnosis not present

## 2018-03-27 DIAGNOSIS — J449 Chronic obstructive pulmonary disease, unspecified: Secondary | ICD-10-CM | POA: Diagnosis not present

## 2018-03-28 DIAGNOSIS — M79674 Pain in right toe(s): Secondary | ICD-10-CM | POA: Diagnosis not present

## 2018-03-28 DIAGNOSIS — M79675 Pain in left toe(s): Secondary | ICD-10-CM | POA: Diagnosis not present

## 2018-03-28 DIAGNOSIS — B351 Tinea unguium: Secondary | ICD-10-CM | POA: Diagnosis not present

## 2018-04-23 DIAGNOSIS — M9904 Segmental and somatic dysfunction of sacral region: Secondary | ICD-10-CM | POA: Diagnosis not present

## 2018-04-23 DIAGNOSIS — M9903 Segmental and somatic dysfunction of lumbar region: Secondary | ICD-10-CM | POA: Diagnosis not present

## 2018-04-23 DIAGNOSIS — M9902 Segmental and somatic dysfunction of thoracic region: Secondary | ICD-10-CM | POA: Diagnosis not present

## 2018-07-17 DIAGNOSIS — G4733 Obstructive sleep apnea (adult) (pediatric): Secondary | ICD-10-CM | POA: Diagnosis not present

## 2018-07-19 DIAGNOSIS — M79675 Pain in left toe(s): Secondary | ICD-10-CM | POA: Diagnosis not present

## 2018-07-19 DIAGNOSIS — I129 Hypertensive chronic kidney disease with stage 1 through stage 4 chronic kidney disease, or unspecified chronic kidney disease: Secondary | ICD-10-CM | POA: Diagnosis not present

## 2018-07-19 DIAGNOSIS — I4891 Unspecified atrial fibrillation: Secondary | ICD-10-CM | POA: Diagnosis not present

## 2018-07-19 DIAGNOSIS — B351 Tinea unguium: Secondary | ICD-10-CM | POA: Diagnosis not present

## 2018-07-19 DIAGNOSIS — R739 Hyperglycemia, unspecified: Secondary | ICD-10-CM | POA: Diagnosis not present

## 2018-07-19 DIAGNOSIS — E039 Hypothyroidism, unspecified: Secondary | ICD-10-CM | POA: Diagnosis not present

## 2018-07-19 DIAGNOSIS — L97521 Non-pressure chronic ulcer of other part of left foot limited to breakdown of skin: Secondary | ICD-10-CM | POA: Diagnosis not present

## 2018-07-19 DIAGNOSIS — I429 Cardiomyopathy, unspecified: Secondary | ICD-10-CM | POA: Diagnosis not present

## 2018-07-19 DIAGNOSIS — N183 Chronic kidney disease, stage 3 (moderate): Secondary | ICD-10-CM | POA: Diagnosis not present

## 2018-07-19 DIAGNOSIS — M79674 Pain in right toe(s): Secondary | ICD-10-CM | POA: Diagnosis not present

## 2018-07-28 ENCOUNTER — Inpatient Hospital Stay
Admission: EM | Admit: 2018-07-28 | Discharge: 2018-08-03 | DRG: 872 | Disposition: A | Discharge status: Skilled Nursing Facility | Payer: PPO | Attending: Internal Medicine | Admitting: Internal Medicine

## 2018-07-28 ENCOUNTER — Emergency Department: Payer: PPO

## 2018-07-28 ENCOUNTER — Encounter: Payer: Self-pay | Admitting: Emergency Medicine

## 2018-07-28 ENCOUNTER — Other Ambulatory Visit: Payer: Self-pay

## 2018-07-28 DIAGNOSIS — E785 Hyperlipidemia, unspecified: Secondary | ICD-10-CM | POA: Diagnosis present

## 2018-07-28 DIAGNOSIS — G4730 Sleep apnea, unspecified: Secondary | ICD-10-CM | POA: Diagnosis not present

## 2018-07-28 DIAGNOSIS — K5900 Constipation, unspecified: Secondary | ICD-10-CM | POA: Diagnosis present

## 2018-07-28 DIAGNOSIS — R498 Other voice and resonance disorders: Secondary | ICD-10-CM | POA: Diagnosis not present

## 2018-07-28 DIAGNOSIS — I5020 Unspecified systolic (congestive) heart failure: Secondary | ICD-10-CM | POA: Diagnosis not present

## 2018-07-28 DIAGNOSIS — I5032 Chronic diastolic (congestive) heart failure: Secondary | ICD-10-CM | POA: Diagnosis not present

## 2018-07-28 DIAGNOSIS — Z79890 Hormone replacement therapy (postmenopausal): Secondary | ICD-10-CM | POA: Diagnosis not present

## 2018-07-28 DIAGNOSIS — Z7901 Long term (current) use of anticoagulants: Secondary | ICD-10-CM | POA: Diagnosis not present

## 2018-07-28 DIAGNOSIS — Z20828 Contact with and (suspected) exposure to other viral communicable diseases: Secondary | ICD-10-CM | POA: Diagnosis present

## 2018-07-28 DIAGNOSIS — E569 Vitamin deficiency, unspecified: Secondary | ICD-10-CM | POA: Diagnosis not present

## 2018-07-28 DIAGNOSIS — L03115 Cellulitis of right lower limb: Secondary | ICD-10-CM | POA: Diagnosis not present

## 2018-07-28 DIAGNOSIS — R531 Weakness: Secondary | ICD-10-CM

## 2018-07-28 DIAGNOSIS — M791 Myalgia, unspecified site: Secondary | ICD-10-CM | POA: Diagnosis not present

## 2018-07-28 DIAGNOSIS — R0902 Hypoxemia: Secondary | ICD-10-CM | POA: Diagnosis not present

## 2018-07-28 DIAGNOSIS — I110 Hypertensive heart disease with heart failure: Secondary | ICD-10-CM | POA: Diagnosis present

## 2018-07-28 DIAGNOSIS — R5381 Other malaise: Secondary | ICD-10-CM | POA: Diagnosis not present

## 2018-07-28 DIAGNOSIS — Z6841 Body Mass Index (BMI) 40.0 and over, adult: Secondary | ICD-10-CM | POA: Diagnosis not present

## 2018-07-28 DIAGNOSIS — Z87891 Personal history of nicotine dependence: Secondary | ICD-10-CM

## 2018-07-28 DIAGNOSIS — E039 Hypothyroidism, unspecified: Secondary | ICD-10-CM | POA: Diagnosis present

## 2018-07-28 DIAGNOSIS — N179 Acute kidney failure, unspecified: Secondary | ICD-10-CM | POA: Diagnosis not present

## 2018-07-28 DIAGNOSIS — L0390 Cellulitis, unspecified: Secondary | ICD-10-CM | POA: Diagnosis not present

## 2018-07-28 DIAGNOSIS — I878 Other specified disorders of veins: Secondary | ICD-10-CM | POA: Diagnosis present

## 2018-07-28 DIAGNOSIS — I482 Chronic atrial fibrillation, unspecified: Secondary | ICD-10-CM | POA: Diagnosis not present

## 2018-07-28 DIAGNOSIS — J309 Allergic rhinitis, unspecified: Secondary | ICD-10-CM | POA: Diagnosis not present

## 2018-07-28 DIAGNOSIS — A419 Sepsis, unspecified organism: Secondary | ICD-10-CM | POA: Diagnosis present

## 2018-07-28 DIAGNOSIS — R509 Fever, unspecified: Secondary | ICD-10-CM | POA: Diagnosis not present

## 2018-07-28 DIAGNOSIS — Z8673 Personal history of transient ischemic attack (TIA), and cerebral infarction without residual deficits: Secondary | ICD-10-CM

## 2018-07-28 DIAGNOSIS — R6510 Systemic inflammatory response syndrome (SIRS) of non-infectious origin without acute organ dysfunction: Secondary | ICD-10-CM

## 2018-07-28 DIAGNOSIS — Z66 Do not resuscitate: Secondary | ICD-10-CM | POA: Diagnosis not present

## 2018-07-28 DIAGNOSIS — Z8249 Family history of ischemic heart disease and other diseases of the circulatory system: Secondary | ICD-10-CM | POA: Diagnosis not present

## 2018-07-28 DIAGNOSIS — Z79899 Other long term (current) drug therapy: Secondary | ICD-10-CM

## 2018-07-28 DIAGNOSIS — L03116 Cellulitis of left lower limb: Secondary | ICD-10-CM | POA: Diagnosis present

## 2018-07-28 DIAGNOSIS — Z7401 Bed confinement status: Secondary | ICD-10-CM | POA: Diagnosis not present

## 2018-07-28 DIAGNOSIS — J99 Respiratory disorders in diseases classified elsewhere: Secondary | ICD-10-CM | POA: Diagnosis not present

## 2018-07-28 DIAGNOSIS — J449 Chronic obstructive pulmonary disease, unspecified: Secondary | ICD-10-CM | POA: Diagnosis not present

## 2018-07-28 DIAGNOSIS — R2681 Unsteadiness on feet: Secondary | ICD-10-CM | POA: Diagnosis not present

## 2018-07-28 DIAGNOSIS — R062 Wheezing: Secondary | ICD-10-CM | POA: Diagnosis not present

## 2018-07-28 DIAGNOSIS — K219 Gastro-esophageal reflux disease without esophagitis: Secondary | ICD-10-CM | POA: Diagnosis not present

## 2018-07-28 DIAGNOSIS — R001 Bradycardia, unspecified: Secondary | ICD-10-CM | POA: Diagnosis not present

## 2018-07-28 DIAGNOSIS — R609 Edema, unspecified: Secondary | ICD-10-CM | POA: Diagnosis not present

## 2018-07-28 DIAGNOSIS — I517 Cardiomegaly: Secondary | ICD-10-CM | POA: Diagnosis not present

## 2018-07-28 DIAGNOSIS — L03818 Cellulitis of other sites: Secondary | ICD-10-CM | POA: Diagnosis not present

## 2018-07-28 DIAGNOSIS — I10 Essential (primary) hypertension: Secondary | ICD-10-CM | POA: Diagnosis not present

## 2018-07-28 DIAGNOSIS — I4891 Unspecified atrial fibrillation: Secondary | ICD-10-CM | POA: Diagnosis not present

## 2018-07-28 DIAGNOSIS — I959 Hypotension, unspecified: Secondary | ICD-10-CM | POA: Diagnosis not present

## 2018-07-28 DIAGNOSIS — R488 Other symbolic dysfunctions: Secondary | ICD-10-CM | POA: Diagnosis not present

## 2018-07-28 DIAGNOSIS — M2550 Pain in unspecified joint: Secondary | ICD-10-CM | POA: Diagnosis not present

## 2018-07-28 DIAGNOSIS — M6281 Muscle weakness (generalized): Secondary | ICD-10-CM | POA: Diagnosis not present

## 2018-07-28 DIAGNOSIS — Z7951 Long term (current) use of inhaled steroids: Secondary | ICD-10-CM

## 2018-07-28 LAB — URINALYSIS, COMPLETE (UACMP) WITH MICROSCOPIC
Bacteria, UA: NONE SEEN
Bilirubin Urine: NEGATIVE
Glucose, UA: NEGATIVE mg/dL
Hgb urine dipstick: NEGATIVE
Ketones, ur: NEGATIVE mg/dL
Leukocytes,Ua: NEGATIVE
Nitrite: NEGATIVE
Protein, ur: NEGATIVE mg/dL
Specific Gravity, Urine: 1.016 (ref 1.005–1.030)
Squamous Epithelial / LPF: NONE SEEN (ref 0–5)
WBC, UA: NONE SEEN WBC/hpf (ref 0–5)
pH: 7 (ref 5.0–8.0)

## 2018-07-28 LAB — BASIC METABOLIC PANEL
Anion gap: 10 (ref 5–15)
BUN: 24 mg/dL — ABNORMAL HIGH (ref 8–23)
CO2: 26 mmol/L (ref 22–32)
Calcium: 8.7 mg/dL — ABNORMAL LOW (ref 8.9–10.3)
Chloride: 101 mmol/L (ref 98–111)
Creatinine, Ser: 1.38 mg/dL — ABNORMAL HIGH (ref 0.61–1.24)
GFR calc Af Amer: 56 mL/min — ABNORMAL LOW (ref >60)
GFR calc non Af Amer: 49 mL/min — ABNORMAL LOW (ref >60)
Glucose, Bld: 105 mg/dL — ABNORMAL HIGH (ref 70–99)
Potassium: 4.2 mmol/L (ref 3.5–5.1)
Sodium: 137 mmol/L (ref 135–145)

## 2018-07-28 LAB — PROTIME-INR
INR: 1.7 — ABNORMAL HIGH (ref 0.8–1.2)
Prothrombin Time: 19.3 seconds — ABNORMAL HIGH (ref 11.4–15.2)

## 2018-07-28 LAB — CBC
HCT: 43.2 % (ref 39.0–52.0)
Hemoglobin: 13.9 g/dL (ref 13.0–17.0)
MCH: 32.3 pg (ref 26.0–34.0)
MCHC: 32.2 g/dL (ref 30.0–36.0)
MCV: 100.2 fL — ABNORMAL HIGH (ref 80.0–100.0)
Platelets: 212 10*3/uL (ref 150–400)
RBC: 4.31 MIL/uL (ref 4.22–5.81)
RDW: 16.3 % — ABNORMAL HIGH (ref 11.5–15.5)
WBC: 15.1 10*3/uL — ABNORMAL HIGH (ref 4.0–10.5)
nRBC: 0 % (ref 0.0–0.2)

## 2018-07-28 LAB — SARS CORONAVIRUS 2 BY RT PCR (HOSPITAL ORDER, PERFORMED IN ~~LOC~~ HOSPITAL LAB): SARS Coronavirus 2: NEGATIVE

## 2018-07-28 LAB — LIPASE, BLOOD: Lipase: 24 U/L (ref 11–51)

## 2018-07-28 LAB — LACTIC ACID, PLASMA: Lactic Acid, Venous: 1.7 mmol/L (ref 0.5–1.9)

## 2018-07-28 MED ORDER — AMIODARONE HCL 200 MG PO TABS
200.0000 mg | ORAL_TABLET | Freq: Every day | ORAL | Status: DC
  Administered 2018-07-28 – 2018-08-03 (×7): 200 mg via ORAL
  Filled 2018-07-28 (×7): qty 1

## 2018-07-28 MED ORDER — LEVOTHYROXINE SODIUM 88 MCG PO TABS
88.0000 ug | ORAL_TABLET | Freq: Every day | ORAL | Status: DC
  Administered 2018-07-28 – 2018-08-03 (×7): 88 ug via ORAL
  Filled 2018-07-28 (×8): qty 1

## 2018-07-28 MED ORDER — METOPROLOL TARTRATE 50 MG PO TABS
100.0000 mg | ORAL_TABLET | Freq: Two times a day (BID) | ORAL | Status: DC
  Administered 2018-07-28: 100 mg via ORAL
  Filled 2018-07-28 (×2): qty 2

## 2018-07-28 MED ORDER — SODIUM CHLORIDE 0.9% FLUSH
3.0000 mL | Freq: Once | INTRAVENOUS | Status: AC
  Administered 2018-07-28: 3 mL via INTRAVENOUS

## 2018-07-28 MED ORDER — RIVAROXABAN 15 MG PO TABS
15.0000 mg | ORAL_TABLET | Freq: Every day | ORAL | Status: DC
  Administered 2018-07-28: 15 mg via ORAL
  Filled 2018-07-28 (×2): qty 1

## 2018-07-28 MED ORDER — TRAMADOL HCL 50 MG PO TABS
50.0000 mg | ORAL_TABLET | Freq: Four times a day (QID) | ORAL | Status: DC | PRN
  Administered 2018-07-28 – 2018-08-03 (×12): 50 mg via ORAL
  Filled 2018-07-28 (×13): qty 1

## 2018-07-28 MED ORDER — VANCOMYCIN HCL 10 G IV SOLR
2500.0000 mg | Freq: Once | INTRAVENOUS | Status: AC
  Administered 2018-07-28: 2500 mg via INTRAVENOUS
  Filled 2018-07-28: qty 2500

## 2018-07-28 MED ORDER — SODIUM CHLORIDE 0.9 % IV SOLN
2.0000 g | Freq: Three times a day (TID) | INTRAVENOUS | Status: DC
  Administered 2018-07-28 – 2018-07-29 (×3): 2 g via INTRAVENOUS
  Filled 2018-07-28 (×4): qty 2

## 2018-07-28 MED ORDER — PANTOPRAZOLE SODIUM 40 MG PO TBEC
40.0000 mg | DELAYED_RELEASE_TABLET | Freq: Every day | ORAL | Status: DC
  Administered 2018-07-28 – 2018-08-03 (×7): 40 mg via ORAL
  Filled 2018-07-28 (×7): qty 1

## 2018-07-28 MED ORDER — UMECLIDINIUM BROMIDE 62.5 MCG/INH IN AEPB
1.0000 | INHALATION_SPRAY | Freq: Every day | RESPIRATORY_TRACT | Status: DC
  Administered 2018-07-28 – 2018-08-03 (×7): 1 via RESPIRATORY_TRACT
  Filled 2018-07-28: qty 7

## 2018-07-28 MED ORDER — VANCOMYCIN HCL 10 G IV SOLR
1750.0000 mg | INTRAVENOUS | Status: DC
  Filled 2018-07-28: qty 1750

## 2018-07-28 MED ORDER — SODIUM CHLORIDE 0.9 % IV BOLUS
500.0000 mL | Freq: Once | INTRAVENOUS | Status: AC
  Administered 2018-07-28: 500 mL via INTRAVENOUS

## 2018-07-28 MED ORDER — SODIUM CHLORIDE 0.9 % IV SOLN
INTRAVENOUS | Status: DC
  Administered 2018-07-28 – 2018-07-29 (×3): via INTRAVENOUS

## 2018-07-28 MED ORDER — ADULT MULTIVITAMIN W/MINERALS CH
1.0000 | ORAL_TABLET | Freq: Every day | ORAL | Status: DC
  Administered 2018-07-28 – 2018-08-03 (×7): 1 via ORAL
  Filled 2018-07-28 (×7): qty 1

## 2018-07-28 MED ORDER — FLUTICASONE-UMECLIDIN-VILANT 100-62.5-25 MCG/INH IN AEPB: 1.0000 | INHALATION_SPRAY | Freq: Every day | RESPIRATORY_TRACT | Status: DC

## 2018-07-28 MED ORDER — FLUTICASONE FUROATE-VILANTEROL 100-25 MCG/INH IN AEPB
1.0000 | INHALATION_SPRAY | Freq: Every day | RESPIRATORY_TRACT | Status: DC
  Administered 2018-07-28 – 2018-08-03 (×7): 1 via RESPIRATORY_TRACT
  Filled 2018-07-28: qty 28

## 2018-07-28 MED ORDER — SODIUM CHLORIDE 0.9 % IV SOLN
INTRAVENOUS | Status: DC | PRN
  Administered 2018-07-28 – 2018-08-02 (×2): 250 mL via INTRAVENOUS

## 2018-07-28 MED ORDER — LORATADINE 10 MG PO TABS
10.0000 mg | ORAL_TABLET | Freq: Every day | ORAL | Status: DC
  Administered 2018-07-28 – 2018-08-03 (×7): 10 mg via ORAL
  Filled 2018-07-28 (×7): qty 1

## 2018-07-28 MED ORDER — ACETAMINOPHEN 500 MG PO TABS
1000.0000 mg | ORAL_TABLET | ORAL | Status: AC
  Administered 2018-07-28: 1000 mg via ORAL
  Filled 2018-07-28: qty 2

## 2018-07-28 NOTE — ED Triage Notes (Signed)
Pt arrived via EMS from home with reports of generalized weakness worse over the past week. Per EMS, wife reports pt has been going to doctor's appointments all week and reports the weakness is worsening.  Per EMS BP was initially low, but improved after about 200cc of fluid. Pt is alert on arrival, c/o pain with moving from EMS stretcher to the ED stretcher.

## 2018-07-28 NOTE — ED Notes (Signed)
First set blood cultures drawn at 0815 by Nira Conn RN

## 2018-07-28 NOTE — ED Notes (Signed)
2nd set blood cultures drawn by [place]

## 2018-07-28 NOTE — ED Notes (Signed)
Per Dr. Jacqualine Code, start with 500cc NS, will adjust fluids once labs result.

## 2018-07-28 NOTE — ED Provider Notes (Signed)
[facility] Emergency Department Provider Note   ____________________________________________   First MD Initiated Contact with Patient 07/28/18 [phone]     (approximate)  I have reviewed the triage vital signs and the nursing notes.   HISTORY  Chief Complaint Weakness    HPI Daniel Eaton. is a 78 y.o. male history of A. fib, congestive heart failure COPD  Patient reports for about the last 3 to 4 days he has not felt well he said fatigue, some achiness.  Yesterday noticed he had a fever, took 2 Tylenol last night, he started to have increasing fatigue to the point he feels like he just cannot walk well because he is so tired all over.  He is aching in all the muscles of his body including his back and neck arms and legs.  He has a cough that is chronic, but has not reported any shortness of breath and does not feel like it is changed from his normal COPD.  No wheezing.  No trouble breathing except just feels a bit breathless when he goes to try to walk because is so fatigued.  He is noticed his urine is dark, and he has not urinated much hardly at all in the last 24 hours.  He also has not eaten anything for about 24 hours he has had no appetite.  No known exposure to coronavirus, denies any tick bites or exposure to ticks that he is aware of.  EMS reported initial blood pressure 90 systolic, improving with IV fluid bolus     Past Medical History:  Diagnosis Date   A-fib [facility])    CHF (congestive heart failure) (H[facility])    COPD (chronic obstructive pulmonary disease) ([place])    Difficult intubation    Hyperlipidemia    Hypertension    Hypothyroidism    Kidney disease    Sleep apnea    Stroke [facility])    Thyroid disease     Patient Active Problem List   Diagnosis Date Noted   Cough 03/20/2015   Bronchitis, chronic obstructive w acute bronchitis ([place]) 94/76/5465   Umbilical hernia without obstruction and without gangrene 01/12/2015    Ventral hernia without obstruction or gangrene 01/02/2015    Past Surgical History:  Procedure Laterality Date   CARDIAC CATHETERIZATION     CATARACT EXTRACTION W/ INTRAOCULAR LENS  IMPLANT, BILATERAL     COLONOSCOPY  2008   HERNIA REPAIR     bilateral inguinal hernia/ [place]   HERNIA REPAIR  02/02/2015   18 x 28 cm ventral light mesh placed laparoscopically.   TONSILLECTOMY     UMBILICAL HERNIA REPAIR N/A 02/02/2015   Procedure: HERNIA REPAIR UMBILICAL ADULT;  Surgeon: Robert Bellow, MD;  Location: [facility] ORS;  Service: General;  Laterality: N/A;   VENTRAL HERNIA REPAIR N/A 02/02/2015   Procedure: LAPAROSCOPIC VENTRAL HERNIA;  Surgeon: Robert Bellow, MD;  Location: [facility] ORS;  Service: General;  Laterality: N/A;   vp shunt placement  1979    Prior to Admission medications   Medication Sig Start Date End Date Taking? Authorizing Provider  amiodarone (PACERONE) 200 MG tablet Take 200 mg by mouth daily.  02/20/14  Yes [provider]  Fluticasone-Umeclidin-Vilant (TRELEGY ELLIPTA) 100-62.5-25 MCG/INH AEPB Inhale 1 puff into the lungs daily.   Yes [provider]  levothyroxine (SYNTHROID) 88 MCG tablet Take 88 mcg by mouth daily.  02/20/14  Yes [provider]  loratadine (CLARITIN) 10 MG tablet Take 10 mg by mouth  daily.   Yes [provider]  metolazone (ZAROXOLYN) 2.5 MG tablet Take 2.5 mg by mouth 2 (two) times a week.  02/20/14  Yes [provider]  metoprolol (LOPRESSOR) 100 MG tablet Take 100 mg by mouth 2 (two) times daily.  02/20/14  Yes [provider]  Multiple Vitamin (MULTI-VITAMINS) TABS Take 1 tablet by mouth daily.    Yes [provider]  Omega-3 Fatty Acids (OMEGA 3 PO) Take 2 capsules by mouth daily.   Yes [provider]  omeprazole (PRILOSEC) 40 MG capsule Take 40 mg by mouth daily.  02/20/14  Yes [provider]  potassium chloride (MICRO-K) 10 MEQ CR capsule TAKE 2 CAPSULES (20 MEQ  TOTAL) BY MOUTH 3 (THREE) TIMES DAILY 05/10/18  Yes [provider]  Rivaroxaban (XARELTO) 15 MG TABS tablet Take 15 mg by mouth daily.   Yes [provider]  spironolactone (ALDACTONE) 25 MG tablet Take 25 mg by mouth 2 (two) times daily.  02/20/14  Yes [provider]  torsemide (DEMADEX) 20 MG tablet Take 20 mg by mouth 2 (two) times daily.  02/20/14  Yes [provider]    Allergies Diltiazem hcl  Family History  Problem Relation Age of Onset   Heart disease Mother    Alcohol abuse Father     Social History Social History   Tobacco Use   Smoking status: Former Smoker   Smokeless tobacco: Never Used  Substance Use Topics   Alcohol use: No   Drug use: No    Review of Systems Constitutional: Fevers and chills s Eyes: No visual changes. ENT: No sore throat.  Dry mouth Cardiovascular: Denies chest pain. Respiratory: Denies shortness of breath.  He has no cough which is normal for him. Gastrointestinal: No abdominal pain.   Genitourinary: Negative for dysuria.  Decreased urination and dark urine for a day. Musculoskeletal: Aching pain in all of his muscles and joints.  Skin: Negative for rash. Neurological: Mild headache, but noareas of focal weakness or numbness.    ____________________________________________   PHYSICAL EXAM:  VITAL SIGNS: ED Triage Vitals  Enc Vitals Group     BP 07/28/18 0801 139/72     Pulse Rate 07/28/18 0801 95     Resp 07/28/18 0801 16     Temp 07/28/18 0801 99.9 F (37.7 C)     Temp Source 07/28/18 0801 Oral     SpO2 07/28/18 0759 99 %     Weight --      Height --      Head Circumference --      Peak Flow --      Pain Score 07/28/18 0809 8     Pain Loc --      Pain Edu? --      Excl. in [place]? --     Constitutional: Alert and oriented.  Moderately ill-appearing, in no acute distress but does appear fatigued.  Eyes: Conjunctivae are normal. Head: Atraumatic. Nose: No  congestion/rhinnorhea. Mouth/Throat: Mucous membranes are fairly dry. Neck: No stridor.  No JVD. Cardiovascular: Normal rate, regular rhythm. Grossly normal heart sounds.  Good peripheral circulation. Respiratory: Normal respiratory effort.  No retractions. Lungs CTAB. Gastrointestinal: Soft and nontender. No distention. Musculoskeletal: No lower extremity tenderness with 1+ lower extremity edema bilaterally and venous stasis changes without overlying cellulitic change ulceration or weeping wounds.  Upper extremities normal bilateral. Neurologic:  Normal speech and language. No gross focal neurologic deficits are appreciated.  Skin:  Skin is hot, dry  and intact. No rash noted.  Patient refused examination of the buttock, reports there is no issue there and he does not wish to have that examined at this time. Psychiatric: Mood and affect are normal. Speech and behavior are normal.  ____________________________________________   LABS (all labs ordered are listed, but only abnormal results are displayed)  Labs Reviewed  BASIC METABOLIC PANEL - Abnormal; Notable for the following components:      Result Value   Glucose, Bld 105 (*)    BUN 24 (*)    Creatinine, Ser 1.38 (*)    Calcium 8.7 (*)    GFR calc non Af Amer 49 (*)    GFR calc Af Amer 56 (*)    All other components within normal limits  CBC - Abnormal; Notable for the following components:   WBC 15.1 (*)    MCV 100.2 (*)    RDW 16.3 (*)    All other components within normal limits  URINALYSIS, COMPLETE (UACMP) WITH MICROSCOPIC - Abnormal; Notable for the following components:   Color, Urine YELLOW (*)    APPearance CLEAR (*)    All other components within normal limits  PROTIME-INR - Abnormal; Notable for the following components:   Prothrombin Time 19.3 (*)    INR 1.7 (*)    All other components within normal limits  SARS CORONAVIRUS 2 (HOSPITAL ORDER, [place] [facility])  CULTURE, BLOOD (ROUTINE X 2)   CULTURE, BLOOD (ROUTINE X 2)  LACTIC ACID, PLASMA  LIPASE, BLOOD  ROCKY MTN SPOTTED FVR ABS PNL(IGG+IGM)   ____________________________________________  EKG  Reviewed and interpreted by me at 802 Heart rate 95 QRS 100 QTc 460 Probable atrial fibrillation, rate controlled.  No evidence of ischemia, some baseline wander.  Nursing attempted multiple EKGs, this is the best we can get as there is lots of artifact and baseline wander ____________________________________________  RADIOLOGY  Dg Chest Portable 1 View  Result Date: 07/28/2018 CLINICAL DATA:  Fever, weakness, and myalgias for 1 week. Atrial fibrillation. COPD. EXAM: PORTABLE CHEST 1 VIEW COMPARISON:  03/01/2016 FINDINGS: Stable mild-to-moderate cardiomegaly. Both lungs are well aerated and clear. No evidence of pleural effusion. IMPRESSION: Stable cardiomegaly. No active lung disease. Electronically Signed   By: Earle Gell M.D.   On: 07/28/2018 08:54     ____________________________________________   PROCEDURES  Procedure(s) performed: None  Procedures  Critical Care performed: No  ____________________________________________   INITIAL IMPRESSION / ASSESSMENT AND PLAN / ED COURSE  Pertinent labs & imaging results that were available during my care of the patient were reviewed by me and considered in my medical decision making (see chart for details).   Patient presents for body aches, fevers chills and significant fatigue to the point that he is having notable difficulty walking.  No focal abnormalities.  Highly suspect infectious etiology, will start a broad work-up, including COVID and given symptomatology and seasonality will also send [facility] spotted fever though I suspect this is unlikely and he reports Lyme in the past that felt different.  Clinical Course as of Jul 28 1055  Sat Jul 28, 2018  0814 102.7  Temp: 99.9 F (37.7 C) [MQ]  0814 Temp 102.7   [MQ]  0853 INR(!): 1.7 [MQ]  0853 WBC(!): 15.1  [MQ]  0950 Await COVID test   [MQ]    Clinical Course User Index [MQ] Delman Kitten, MD    ----------------------------------------- 10:02 AM on 07/28/2018 -----------------------------------------  Etiology of the patient's fever is not clear,  denies abdominal symptoms.  His symptoms that seem to fit that of a likely viral illness.  His multiple comorbidities and his COVID test is negative at present, but this certainly does not completely exclude it giving this national emergency and pandemic.  We will plan to admit the patient to the hospital for further work-up, follow-up on his blood cultures, and further diagnostic evaluation as a cause for his fever.  ----------------------------------------- 10:57 AM on 07/28/2018 -----------------------------------------  Patient understand agreeable with plan for admission, further work-up and treatment under the hospitalist service discussed.  Patient reports he started to feel bit better after fluids and Tylenol reports he thinks he broke the fever.  He is alert and oriented, still appears fatigued.  Thus far I suspect a viral illness, but will admit and defer further work-up to the hospitalist service. ____________________________________________   FINAL CLINICAL IMPRESSION(S) / ED DIAGNOSES  Final diagnoses:  SIRS (systemic inflammatory response syndrome) (HCC)  General weakness  Myalgia        Note:  This document was prepared using Dragon voice recognition software and may include unintentional dictation errors       Delman Kitten, MD 07/28/18 1057

## 2018-07-28 NOTE — Progress Notes (Signed)
Care Alignment Note  Advanced Directives Documents (Living Will, Power of Attorney) currently in the EHR no advanced directives documents available .  Has the patient discussed their wishes with their family/healthcare power of attorney no.  What does the patient/decision maker understand about their medical condition and the natural course of their disease.  Sepsis.  Bilateral lower extremity cellulitis.  Chronic diastolic CHF.  COPD  What is the patient/decision maker's biggest fear or concern for the future pain and suffering   What is the most important goal for this patient should their health condition worsen care focused on comfort .  Current   Code Status: DNR  Current code status has been reviewed/updated.  Time spent:20 minutes

## 2018-07-28 NOTE — Consult Note (Signed)
Pharmacy Antibiotic Note  Daniel Eaton. is a 78 y.o. male admitted on 07/28/2018 with sepsis and cellulitis.  Pharmacy has been consulted for cefepime and vancomycin dosing.  Plan: Will start cefepime 2 g q8H   Will order a vancomycin loading dose of 2500 mg x 1 followed by vancomycin 1750 mg q24H. Predicted AUC 481. Goal AUC 400-550. Scr 1.38 used. Plan to order levels in 4-5 days.   Height: 5\' 11"  (180.3 cm) Weight: (!) 350 lb (158.8 kg) IBW/kg (Calculated) : 75.3  Temp (24hrs), Avg:102.7 F (39.3 C), Min:102.7 F (39.3 C), Max:102.7 F (39.3 C)  Recent Labs  Lab 07/28/18 0807 07/28/18 0813  WBC 15.1*  --   CREATININE 1.38*  --   LATICACIDVEN  --  1.7    Estimated Creatinine Clearance: 67.8 mL/min (A) (by C-G formula based on SCr of 1.38 mg/dL (H)).    Allergies  Allergen Reactions  . Diltiazem Hcl Itching and Other (See Comments)    edema    Antimicrobials this admission: 6/13 cefepime >>  6/13 vancomycin >>   Dose adjustments this admission: None  Microbiology results: 6/13 BCx: pending  Thank you for allowing pharmacy to be a part of this patient's care.  Oswald Hillock, PharmD, BCPS 07/28/2018 11:22 AM

## 2018-07-28 NOTE — ED Notes (Signed)
ED TO INPATIENT HANDOFF REPORT  ED Nurse Name and Phone #: Janett Billow [phone]  S Name/Age/Gender Daniel Eaton. 78 y.o. male Room/Bed: ED02A/ED02A  Code Status   Code Status: DNR  Home/SNF/Other Home Patient oriented to: self, place, time and situation Is this baseline? Yes   Triage Complete: Triage complete  Chief Complaint Hypotension  Triage Note Pt arrived via EMS from home with reports of generalized weakness worse over the past week. Per EMS, wife reports pt has been going to doctor's appointments all week and reports the weakness is worsening.  Per EMS BP was initially low, but improved after about 200cc of fluid. Pt is alert on arrival, c/o pain with moving from EMS stretcher to the ED stretcher.   Allergies Allergies  Allergen Reactions  . Diltiazem Hcl Itching and Other (See Comments)    edema    Level of Care/Admitting Diagnosis ED Disposition    ED Disposition Condition [facility] Area: [place] [[id number]]  Level of Care: Med-Surg [16]  Covid Evaluation: Confirmed COVID Negative  Diagnosis: Sepsis [facility]) [[id number]]  Admitting Physician: Otila Back [place]  Attending Physician: Otila Back [[id number]]  Estimated length of stay: past midnight tomorrow  Certification:: I certify this patient will need inpatient services for at least 2 midnights  PT Class (Do Not Modify): Inpatient [101]  PT Acc Code (Do Not Modify): Private [1]       B Medical/Surgery History Past Medical History:  Diagnosis Date  . A-fib ([place])   . CHF (congestive heart failure) ([place])   . COPD (chronic obstructive pulmonary disease) ([place])   . Difficult intubation   . Hyperlipidemia   . Hypertension   . Hypothyroidism   . Kidney disease   . Sleep apnea   . Stroke ([place])   . Thyroid disease    Past Surgical History:  Procedure Laterality Date  . CARDIAC CATHETERIZATION    . CATARACT EXTRACTION W/ INTRAOCULAR LENS  IMPLANT, BILATERAL    .  COLONOSCOPY  2008  . HERNIA REPAIR     bilateral inguinal hernia/ [facility]  . HERNIA REPAIR  02/02/2015   18 x 28 cm ventral light mesh placed laparoscopically.  . TONSILLECTOMY    . UMBILICAL HERNIA REPAIR N/A 02/02/2015   Procedure: HERNIA REPAIR UMBILICAL ADULT;  Surgeon: Robert Bellow, MD;  Location: [facility] ORS;  Service: General;  Laterality: N/A;  . VENTRAL HERNIA REPAIR N/A 02/02/2015   Procedure: LAPAROSCOPIC VENTRAL HERNIA;  Surgeon: Robert Bellow, MD;  Location: [facility] ORS;  Service: General;  Laterality: N/A;  . vp shunt placement  1979     A IV Location/Drains/Wounds Patient Lines/Drains/Airways Status   Active Line/Drains/Airways    Name:   Placement date:   Placement time:   Site:   Days:   Peripheral IV 07/28/18 Right Antecubital   07/28/18    -    Antecubital   less than 1   Peripheral IV 07/28/18 Left Antecubital   07/28/18    0817    Antecubital   less than 1   Peripheral IV 07/28/18 Left Arm   07/28/18    0829    Arm   less than 1   Incision (Closed) 02/02/15 Abdomen   02/02/15    0954     1272          Intake/Output Last 24 hours  Intake/Output Summary (Last 24 hours) at 07/28/2018 1124 Last data filed at 07/28/2018 0930 Gross per 24  hour  Intake 700 ml  Output 100 ml  Net 600 ml    Labs/Imaging Results for orders placed or performed during the hospital encounter of 07/28/18 (from the past 48 hour(s))  Basic metabolic panel     Status: Abnormal   Collection Time: 07/28/18  8:07 AM  Result Value Ref Range   Sodium 137 135 - 145 mmol/L   Potassium 4.2 3.5 - 5.1 mmol/L   Chloride 101 98 - 111 mmol/L   CO2 26 22 - 32 mmol/L   Glucose, Bld 105 (H) 70 - 99 mg/dL   BUN 24 (H) 8 - 23 mg/dL   Creatinine, Ser 1.38 (H) 0.61 - 1.24 mg/dL   Calcium 8.7 (L) 8.9 - 10.3 mg/dL   GFR calc non Af Amer 49 (L) >60 mL/min   GFR calc Af Amer 56 (L) >60 mL/min   Anion gap 10 5 - 15    Comment: Performed at [facility], [address] [place], [place]  [address]  CBC     Status: Abnormal   Collection Time: 07/28/18  8:07 AM  Result Value Ref Range   WBC 15.1 (H) 4.0 - 10.5 K/uL   RBC 4.31 4.22 - 5.81 MIL/uL   Hemoglobin 13.9 13.0 - 17.0 g/dL   HCT 43.2 39.0 - 52.0 %   MCV 100.2 (H) 80.0 - 100.0 fL   MCH 32.3 26.0 - 34.0 pg   MCHC 32.2 30.0 - 36.0 g/dL   RDW 16.3 (H) 11.5 - 15.5 %   Platelets 212 150 - 400 K/uL   nRBC 0.0 0.0 - 0.2 %    Comment: Performed at [facility], [address] [place], [place] [address]  Lipase, blood     Status: None   Collection Time: 07/28/18  8:07 AM  Result Value Ref Range   Lipase 24 11 - 51 U/L    Comment: Performed at [facility], [address] [place], [place] [address]  Lactic acid, plasma     Status: None   Collection Time: 07/28/18  8:13 AM  Result Value Ref Range   Lactic Acid, Venous 1.7 0.5 - 1.9 mmol/L    Comment: Performed at [facility], [address] [place],  [address]  Protime-INR     Status: Abnormal   Collection Time: 07/28/18  8:21 AM  Result Value Ref Range   Prothrombin Time 19.3 (H) 11.4 - 15.2 seconds   INR 1.7 (H) 0.8 - 1.2    Comment: (NOTE) INR goal varies based on device and disease states. Performed at [facility], [address] [place],  [address]   Urinalysis, Complete w Microscopic     Status: Abnormal   Collection Time: 07/28/18  8:41 AM  Result Value Ref Range   Color, Urine YELLOW (A) YELLOW   APPearance CLEAR (A) CLEAR   Specific Gravity, Urine 1.016 1.005 - 1.030   pH 7.0 5.0 - 8.0   Glucose, UA NEGATIVE NEGATIVE mg/dL   Hgb urine dipstick NEGATIVE NEGATIVE   Bilirubin Urine NEGATIVE NEGATIVE   Ketones, ur NEGATIVE NEGATIVE mg/dL   Protein, ur NEGATIVE NEGATIVE mg/dL   Nitrite NEGATIVE NEGATIVE   Leukocytes,Ua NEGATIVE NEGATIVE   RBC / HPF 0-5 0 - 5 RBC/hpf   WBC, UA NONE SEEN 0 - 5 WBC/hpf   Bacteria, UA NONE SEEN NONE SEEN   Squamous Epithelial / LPF NONE SEEN 0 - 5    Comment:  Performed at [facility],  [place], [place] [address]  SARS Coronavirus 2 (CEPHEID- Performed in [facility] hospital lab), Hosp Order     Status: None   Collection Time: 07/28/18  8:43 AM   Specimen: Nasopharyngeal Swab  Result Value Ref Range   SARS Coronavirus 2 NEGATIVE NEGATIVE    Comment: (NOTE) If result is NEGATIVE SARS-CoV-2 target nucleic acids are NOT DETECTED. The SARS-CoV-2 RNA is generally detectable in upper and lower  respiratory specimens during the acute phase of infection. The lowest  concentration of SARS-CoV-2 viral copies this assay can detect is 250  copies / mL. A negative result does not preclude SARS-CoV-2 infection  and should not be used as the sole basis for treatment or other  patient management decisions.  A negative result may occur with  improper specimen collection / handling, submission of specimen other  than nasopharyngeal swab, presence of viral mutation(s) within the  areas targeted by this assay, and inadequate number of viral copies  (<250 copies / mL). A negative result must be combined with clinical  observations, patient history, and epidemiological information. If result is POSITIVE SARS-CoV-2 target nucleic acids are DETECTED. The SARS-CoV-2 RNA is generally detectable in upper and lower  respiratory specimens dur ing the acute phase of infection.  Positive  results are indicative of active infection with SARS-CoV-2.  Clinical  correlation with patient history and other diagnostic information is  necessary to determine patient infection status.  Positive results do  not rule out bacterial infection or co-infection with other viruses. If result is PRESUMPTIVE POSTIVE SARS-CoV-2 nucleic acids MAY BE PRESENT.   A presumptive positive result was obtained on the submitted specimen  and confirmed on repeat testing.  While 2019 novel coronavirus  (SARS-CoV-2) nucleic acids may be present in the submitted sample   additional confirmatory testing may be necessary for epidemiological  and / or clinical management purposes  to differentiate between  SARS-CoV-2 and other Sarbecovirus currently known to infect humans.  If clinically indicated additional testing with an alternate test  methodology [phone]) is advised. The SARS-CoV-2 RNA is generally  detectable in upper and lower respiratory sp ecimens during the acute  phase of infection. The expected result is Negative. Fact Sheet for Patients:  StrictlyIdeas.no Fact Sheet for Healthcare Providers: BankingDealers.co.za This test is not yet approved or cleared by the [place] FDA and has been authorized for detection and/or diagnosis of SARS-CoV-2 by FDA under an Emergency Use Authorization (EUA).  This EUA will remain in effect (meaning this test can be used) for the duration of the COVID-19 declaration under Section 564(b)(1) of the Act, 21 U.S.C. section 360bbb-3(b)(1), unless the authorization is terminated or revoked sooner. Performed at [facility], [address] [place], [place] [address]    Dg Chest Portable 1 View  Result Date: 07/28/2018 CLINICAL DATA:  Fever, weakness, and myalgias for 1 week. Atrial fibrillation. COPD. EXAM: PORTABLE CHEST 1 VIEW COMPARISON:  03/01/2016 FINDINGS: Stable mild-to-moderate cardiomegaly. Both lungs are well aerated and clear. No evidence of pleural effusion. IMPRESSION: Stable cardiomegaly. No active lung disease. Electronically Signed   By: Earle Gell M.D.   On: 07/28/2018 08:54    Pending Labs Unresulted Labs (From admission, onward)    Start     Ordered   07/29/18 0500  Protime-INR  Tomorrow morning,   STAT     07/28/18 1109   07/29/18 0500  Cortisol-am, blood  Tomorrow morning,   STAT     07/28/18 1109  07/29/18 0500  Procalcitonin  Tomorrow morning,   STAT     07/28/18 1109   07/29/18 2505  Basic metabolic panel  Tomorrow morning,    STAT     07/28/18 1111   07/29/18 0500  CBC  Tomorrow morning,   STAT     07/28/18 1111   07/29/18 0500  Magnesium  Tomorrow morning,   STAT     07/28/18 1111   07/29/18 0500  TSH  Tomorrow morning,   STAT     07/28/18 1111   07/28/18 0812  Rocky mtn spotted fvr abs pnl(IgG+IgM)  Once,   STAT     07/28/18 0811   07/28/18 0809  Blood culture (routine x 2)  BLOOD CULTURE X 2,   STAT     07/28/18 0809          Vitals/Pain Today's Vitals   07/28/18 0841 07/28/18 0901 07/28/18 1100 07/28/18 1124  BP: 113/62 138/67 (!) 109/53   Pulse: 84 92 86   Resp: 15 17 13    Temp:    98.8 F (37.1 C)  TempSrc:    Oral  SpO2: 99% 99% 96%   Weight:      Height:      PainSc:        Isolation Precautions No active isolations  Medications Medications  sodium chloride flush (NS) 0.9 % injection 3 mL (has no administration in time range)  amiodarone (PACERONE) tablet 200 mg (has no administration in time range)  metoprolol tartrate (LOPRESSOR) tablet 100 mg (has no administration in time range)  levothyroxine (SYNTHROID) tablet 88 mcg (has no administration in time range)  pantoprazole (PROTONIX) EC tablet 40 mg (has no administration in time range)  Rivaroxaban (XARELTO) tablet 15 mg (has no administration in time range)  Multi-Vitamins TABS 1 tablet (has no administration in time range)  Fluticasone-Umeclidin-Vilant 100-62.5-25 MCG/INH AEPB 1 puff (has no administration in time range)  loratadine (CLARITIN) tablet 10 mg (has no administration in time range)  0.9 %  sodium chloride infusion (has no administration in time range)  sodium chloride 0.9 % bolus 500 mL (0 mLs Intravenous Stopped 07/28/18 0930)  acetaminophen (TYLENOL) tablet 1,000 mg (1,000 mg Oral Given 07/28/18 0915)    Mobility walks with person assist High fall risk   Focused Assessments Pulmonary Assessment Handoff:  Lung sounds:  clear O2 Device: Room Air        R Recommendations: See Admitting Provider  Note  Report given to:   Additional Notes:

## 2018-07-28 NOTE — H&P (Signed)
[place] [facility] [place] NAME: Daniel Eaton    MR#:  [id number]  DATE OF BIRTH:  Jun 08, 1940  DATE OF ADMISSION:  07/28/2018  PRIMARY CARE PHYSICIAN: [name], MD   REQUESTING/REFERRING PHYSICIAN: Delman Kitten  CHIEF COMPLAINT:   Chief Complaint  Patient presents with  . Weakness  Fevers, Redness and swelling of bilateral lower extremity superimposed on chronic venous stasis  HISTORY OF PRESENT ILLNESS:  Daniel Eaton  is a 78 y.o. male with a known history of chronic diastolic CHF, chronic atrial fibrillation on anticoagulation with Xarelto, COPD, hypertension hypothyroidism and hyperlipidemia who presented to the emergency room with complaints of 3-day history of not feeling well with generalized weakness.  Also having fevers.  Has also noticed some redness with differential warmth on both lower extremity.  Has chronic venous stasis on both lower extremity at baseline.  Patient was evaluated in the emergency room.  Noted to have fever of 102.7. COVID test done was negative.  Chest x-ray negative.  Urinalysis negative.  Patient denies any confusion and no evidence of confusion on evaluation.  No neck stiffness.  Laboratory studies with leukocytosis with white count of 15,000.  Patient diagnosed with sepsis secondary to bilateral lower extremity cellulitis.  Medical service called to admit patient for further evaluation and management.  PAST MEDICAL HISTORY:   Past Medical History:  Diagnosis Date  . A-fib ([place])   . CHF (congestive heart failure) ([place])   . COPD (chronic obstructive pulmonary disease) ([place])   . Difficult intubation   . Hyperlipidemia   . Hypertension   . Hypothyroidism   . Kidney disease   . Sleep apnea   . Stroke ([place])   . Thyroid disease     PAST SURGICAL HISTORY:   Past Surgical History:  Procedure Laterality Date  . CARDIAC CATHETERIZATION    . CATARACT EXTRACTION W/ INTRAOCULAR LENS  IMPLANT, BILATERAL     . COLONOSCOPY  2008  . HERNIA REPAIR     bilateral inguinal hernia/ [facility]  . HERNIA REPAIR  02/02/2015   18 x 28 cm ventral light mesh placed laparoscopically.  . TONSILLECTOMY    . UMBILICAL HERNIA REPAIR N/A 02/02/2015   Procedure: HERNIA REPAIR UMBILICAL ADULT;  Surgeon: Robert Bellow, MD;  Location: [facility] ORS;  Service: General;  Laterality: N/A;  . VENTRAL HERNIA REPAIR N/A 02/02/2015   Procedure: LAPAROSCOPIC VENTRAL HERNIA;  Surgeon: Robert Bellow, MD;  Location: [facility] ORS;  Service: General;  Laterality: N/A;  . vp shunt placement  1979    SOCIAL HISTORY:   Social History   Tobacco Use  . Smoking status: Former Research scientist (life sciences)  . Smokeless tobacco: Never Used  Substance Use Topics  . Alcohol use: No    FAMILY HISTORY:   Family History  Problem Relation Age of Onset  . Heart disease Mother   . Alcohol abuse Father     DRUG ALLERGIES:   Allergies  Allergen Reactions  . Diltiazem Hcl Itching and Other (See Comments)    edema    REVIEW OF SYSTEMS:   Review of Systems  Constitutional: Positive for chills and fever.  HENT: Negative for hearing loss and tinnitus.   Eyes: Negative for blurred vision and double vision.  Respiratory: Negative for cough, hemoptysis and shortness of breath.   Cardiovascular: Negative for chest pain and palpitations.  Gastrointestinal: Negative for abdominal pain, heartburn, nausea and vomiting.  Genitourinary: Negative for dysuria and urgency.  Musculoskeletal:  Negative for myalgias and neck pain.  Skin: Negative for itching.       Chronic venous stasis changes with areas of redness and differential warmth on both lower extremity.  Neurological: Negative for dizziness, focal weakness and headaches.  Psychiatric/Behavioral: Negative for depression and substance abuse.    MEDICATIONS AT HOME:   Prior to Admission medications   Medication Sig Start Date End Date Taking? Authorizing Provider  amiodarone (PACERONE) 200 MG tablet  Take 200 mg by mouth daily.  02/20/14  Yes [provider]  Fluticasone-Umeclidin-Vilant (TRELEGY ELLIPTA) 100-62.5-25 MCG/INH AEPB Inhale 1 puff into the lungs daily.   Yes [provider]  levothyroxine (SYNTHROID) 88 MCG tablet Take 88 mcg by mouth daily.  02/20/14  Yes [provider]  loratadine (CLARITIN) 10 MG tablet Take 10 mg by mouth daily.   Yes [provider]  metolazone (ZAROXOLYN) 2.5 MG tablet Take 2.5 mg by mouth 2 (two) times a week.  02/20/14  Yes [provider]  metoprolol (LOPRESSOR) 100 MG tablet Take 100 mg by mouth 2 (two) times daily.  02/20/14  Yes [provider]  Multiple Vitamin (MULTI-VITAMINS) TABS Take 1 tablet by mouth daily.    Yes [provider]  Omega-3 Fatty Acids (OMEGA 3 PO) Take 2 capsules by mouth daily.   Yes [provider]  omeprazole (PRILOSEC) 40 MG capsule Take 40 mg by mouth daily.  02/20/14  Yes [provider]  potassium chloride (MICRO-K) 10 MEQ CR capsule TAKE 2 CAPSULES (20 MEQ TOTAL) BY MOUTH 3 (THREE) TIMES DAILY 05/10/18  Yes [provider]  Rivaroxaban (XARELTO) 15 MG TABS tablet Take 15 mg by mouth daily.   Yes [provider]  spironolactone (ALDACTONE) 25 MG tablet Take 25 mg by mouth 2 (two) times daily.  02/20/14  Yes [provider]  torsemide (DEMADEX) 20 MG tablet Take 20 mg by mouth 2 (two) times daily.  02/20/14  Yes [provider]      VITAL SIGNS:  Blood pressure (!) 109/53, pulse 86, temperature (!) 102.7 F (39.3 C), temperature source Oral, resp. rate 13, height 5\' 11"  (1.803 m), weight (!) 158.8 kg, SpO2 96 %.  PHYSICAL EXAMINATION:  Physical Exam  GENERAL:  78 y.o.-year-old patient lying in the bed with no acute distress.  EYES: Pupils equal, round, reactive to light and accommodation. No scleral icterus. Extraocular muscles intact.  HEENT: Head atraumatic, normocephalic. Oropharynx and nasopharynx clear.  NECK:   Supple, no jugular venous distention. No thyroid enlargement, no tenderness.  LUNGS: Normal breath sounds bilaterally, no wheezing, rales,rhonchi or crepitation. No use of accessory muscles of respiration.  CARDIOVASCULAR: S1, S2 normal. No murmurs, rubs, or gallops.  ABDOMEN: Soft, nontender, nondistended. Bowel sounds present. No organomegaly or mass.  EXTREMITIES: Redness , differential warmth and mild tenderness on both lower extremities superimposed on chronic venous stasis changes bilaterally.  No clubbing.  NEUROLOGIC: Cranial nerves II through XII are intact. Muscle strength 5/5 in all extremities. Sensation intact. Gait not checked.  PSYCHIATRIC: The patient is alert and oriented x 3.  SKIN: Chronic venous stasis changes on both lower extremity with superimposed cellulitis.  No ulcers.Marland Kitchen   LABORATORY PANEL:   CBC Recent Labs  Lab 07/28/18 0807  WBC 15.1*  HGB 13.9  HCT 43.2  PLT 212   ------------------------------------------------------------------------------------------------------------------  Chemistries  Recent Labs  Lab 07/28/18 0807  NA 137  K 4.2  CL 101  CO2 26  GLUCOSE 105*  BUN 24*  CREATININE 1.38*  CALCIUM 8.7*   ------------------------------------------------------------------------------------------------------------------  Cardiac Enzymes No results for input(s): TROPONINI in the last 168 hours. ------------------------------------------------------------------------------------------------------------------  RADIOLOGY:  Dg Chest Portable 1 View  Result Date: 07/28/2018 CLINICAL DATA:  Fever, weakness, and myalgias for 1 week. Atrial fibrillation. COPD. EXAM: PORTABLE CHEST 1 VIEW COMPARISON:  03/01/2016 FINDINGS: Stable mild-to-moderate cardiomegaly. Both lungs are well aerated and clear. No evidence of pleural effusion. IMPRESSION: Stable cardiomegaly. No active lung disease. Electronically Signed   By: Earle Gell M.D.   On: 07/28/2018 08:54       IMPRESSION AND PLAN:  Patient is a 78 year old male with history of chronic diastolic CHF, chronic atrial fibrillation on anticoagulation with Xarelto, COPD and hypothyroidism admitted to medical service on account of sepsis secondary to bilateral lower extremity cellulitis  1.  Sepsis secondary to bilateral lower extremity cellulitis Patient presented with fevers with temperature of 102.7.  Evidence of leukocytosis with white count of 15,000. Hemodynamically stable at this time.  Hydrated with IV fluids in the emergency room. Continue gentle IV fluid hydration while monitoring cardiopulmonary status closely. Placed on broad-spectrum IV antibiotics with vancomycin and cefepime with pharmacy to assist with dosing and plan to de-escalate antibiotics based on clinical course and culture results.  2.  Chronic diastolic CHF Stable at this time.  Holding off on home dose of diuretics including metolazone and spironolactone as well as torsemide Monitor closely while hydrating with gentle IV fluids being used for management of sepsis.  3.  COPD Stable  4.  Hypothyroidism Continue Synthroid.  TSH level in a.m.  5.  Hypertension Blood pressure currently stable Resume home blood pressure medications.  Monitor and adjust meds as needed.  DVT prophylaxis; patient already on Xarelto  All the records are reviewed and case discussed with ED provider. Management plans discussed with the patient, he is in agreement.  CODE STATUS: DNR/DNI Patient awake and alert and able to make his own medical decisions.  Clearly wishes to be DNR/DNI going forward.  TOTAL TIME TAKING CARE OF THIS PATIENT: 62 minutes.    Karlene Southard M.D on 07/28/2018 at 11:11 AM  Between 7am to 6pm - Pager - [phone]  After 6pm go to [email or web address] - Technical brewer [facility]  Office  [phone]  CC: Primary care physician; [name], MD   Note: This dictation was  prepared with Dragon dictation along with smaller phrase technology. Any transcriptional errors that result from this process are unintentional.

## 2018-07-28 NOTE — ED Notes (Addendum)
Pt reports he has had a fever at home as well as shortness of breath. Pt also reports decreased urine output and noticed it was darker in color. PT reports his legs are more swollen as well. Pt also reports generalized body aches.

## 2018-07-29 LAB — CBC
HCT: 37.2 % — ABNORMAL LOW (ref 39.0–52.0)
Hemoglobin: 11.9 g/dL — ABNORMAL LOW (ref 13.0–17.0)
MCH: 32.1 pg (ref 26.0–34.0)
MCHC: 32 g/dL (ref 30.0–36.0)
MCV: 100.3 fL — ABNORMAL HIGH (ref 80.0–100.0)
Platelets: 165 10*3/uL (ref 150–400)
RBC: 3.71 MIL/uL — ABNORMAL LOW (ref 4.22–5.81)
RDW: 16.6 % — ABNORMAL HIGH (ref 11.5–15.5)
WBC: 8.1 10*3/uL (ref 4.0–10.5)
nRBC: 0 % (ref 0.0–0.2)

## 2018-07-29 LAB — BASIC METABOLIC PANEL
Anion gap: 8 (ref 5–15)
BUN: 17 mg/dL (ref 8–23)
CO2: 21 mmol/L — ABNORMAL LOW (ref 22–32)
Calcium: 8.3 mg/dL — ABNORMAL LOW (ref 8.9–10.3)
Chloride: 106 mmol/L (ref 98–111)
Creatinine, Ser: 1.15 mg/dL (ref 0.61–1.24)
GFR calc Af Amer: >60 mL/min (ref >60)
GFR calc non Af Amer: >60 mL/min (ref >60)
Glucose, Bld: 82 mg/dL (ref 70–99)
Potassium: 3.9 mmol/L (ref 3.5–5.1)
Sodium: 135 mmol/L (ref 135–145)

## 2018-07-29 LAB — BLOOD CULTURE ID PANEL (REFLEXED)

## 2018-07-29 LAB — MAGNESIUM: Magnesium: 2.1 mg/dL (ref 1.7–2.4)

## 2018-07-29 LAB — PROTIME-INR
INR: 1.9 — ABNORMAL HIGH (ref 0.8–1.2)
Prothrombin Time: 21.9 seconds — ABNORMAL HIGH (ref 11.4–15.2)

## 2018-07-29 LAB — TSH: TSH: 1.082 u[IU]/mL (ref 0.350–4.500)

## 2018-07-29 LAB — CORTISOL-AM, BLOOD: Cortisol - AM: 11.9 ug/dL (ref 6.7–22.6)

## 2018-07-29 LAB — PROCALCITONIN: Procalcitonin: 0.27 ng/mL

## 2018-07-29 MED ORDER — RIVAROXABAN 20 MG PO TABS
20.0000 mg | ORAL_TABLET | Freq: Every day | ORAL | Status: DC
  Administered 2018-07-29 – 2018-08-02 (×5): 20 mg via ORAL
  Filled 2018-07-29 (×6): qty 1

## 2018-07-29 MED ORDER — METOPROLOL TARTRATE 50 MG PO TABS
50.0000 mg | ORAL_TABLET | Freq: Two times a day (BID) | ORAL | Status: DC
  Administered 2018-07-29 – 2018-07-30 (×2): 50 mg via ORAL
  Filled 2018-07-29 (×2): qty 1

## 2018-07-29 MED ORDER — CEFAZOLIN SODIUM-DEXTROSE 1-4 GM/50ML-% IV SOLN
1.0000 g | Freq: Three times a day (TID) | INTRAVENOUS | Status: DC
  Administered 2018-07-29 – 2018-07-30 (×3): 1 g via INTRAVENOUS
  Filled 2018-07-29 (×5): qty 50

## 2018-07-29 MED ORDER — RIVAROXABAN 20 MG PO TABS: 20.0000 mg | ORAL_TABLET | Freq: Every day | ORAL | Status: DC

## 2018-07-29 NOTE — Progress Notes (Signed)
Daniel Eaton at [place] NAME: Daniel Eaton    MR#:  [id number]  DATE OF BIRTH:  1940-04-28  SUBJECTIVE:  CHIEF COMPLAINT:   Chief Complaint  Patient presents with  . Weakness   No new complaints this morning.  No fevers overnight.  REVIEW OF SYSTEMS:  Review of Systems  Constitutional: Negative for chills and fever.  HENT: Negative for hearing loss and tinnitus.   Eyes: Negative for blurred vision and double vision.  Respiratory: Negative for cough, hemoptysis and shortness of breath.   Cardiovascular: Negative for palpitations.  Gastrointestinal: Negative for heartburn and nausea.  Genitourinary: Negative for urgency.  Musculoskeletal: Negative for back pain.       Less redness and warmth on both lower extremity  Skin: Negative for itching and rash.  Neurological: Negative for dizziness and headaches.  Psychiatric/Behavioral: Negative for depression and substance abuse.    DRUG ALLERGIES:   Allergies  Allergen Reactions  . Diltiazem Hcl Itching and Other (See Comments)    edema   VITALS:  Blood pressure (!) 108/57, pulse 63, temperature 98.4 F (36.9 C), temperature source Oral, resp. rate (!) 22, height 5\' 11"  (1.803 m), weight (!) 158.8 kg, SpO2 97 %. PHYSICAL EXAMINATION:   Physical Exam  Constitutional: He is oriented to person, place, and time. He appears well-developed.  HENT:  Head: Normocephalic and atraumatic.  Right Ear: External ear normal.  Eyes: Pupils are equal, round, and reactive to light. Conjunctivae are normal. Right eye exhibits no discharge.  Neck: Normal range of motion. Neck supple. No tracheal deviation present.  Cardiovascular:  No murmur heard. Irregularly irregular  Respiratory: Effort normal and breath sounds normal. He has no wheezes.  GI: Soft. Bowel sounds are normal. There is no abdominal tenderness.  Musculoskeletal: Normal range of motion.        General: No tenderness.     Comments:  Chronic venous stasis changes with areas of redness and differential warmth on both lower extremity; improved  Neurological: He is alert and oriented to person, place, and time. No cranial nerve deficit.  Skin: Skin is warm. He is not diaphoretic. No erythema.  Psychiatric: He has a normal mood and affect. His behavior is normal.   LABORATORY PANEL:  Male CBC Recent Labs  Lab 07/29/18 0515  WBC 8.1  HGB 11.9*  HCT 37.2*  PLT 165   ------------------------------------------------------------------------------------------------------------------ Chemistries  Recent Labs  Lab 07/29/18 0515  NA 135  K 3.9  CL 106  CO2 21*  GLUCOSE 82  BUN 17  CREATININE 1.15  CALCIUM 8.3*  MG 2.1   RADIOLOGY:  No results found. ASSESSMENT AND PLAN:   Patient is a 78 year old male with history of chronic diastolic CHF, chronic atrial fibrillation on anticoagulation with Xarelto, COPD and hypothyroidism admitted to medical service on account of sepsis secondary to bilateral lower extremity cellulitis  1.  Sepsis secondary to bilateral lower extremity cellulitis Patient presented with fevers with temperature of 102.7.  Evidence of leukocytosis with white count of 15,000. Hemodynamically stable at this time.  Patient remains has remained afebrile.  Clinically improving with evidence of improvement in bilateral lower extremity cellulitis.  Leukocytosis resolved. Initiated de-escalation of antibiotics by discontinuing vancomycin and cefepime.  Patient started on IV cefazolin.  Gentle IV fluid hydration while monitoring cardiopulmonary status closely Monitor clinically.  2.  Chronic diastolic CHF Stable at this time.  Holding off on home dose of diuretics including metolazone and spironolactone as  well as torsemide Monitor closely while hydrating with gentle IV fluids being used for management of sepsis.  3.  COPD Stable  4.  Hypothyroidism Continue Synthroid.  TSH level normal  5.   Hypertension Blood pressure borderline.  Decreased dose of metoprolol from 100 to 50 mg p.o. twice daily to prevent hypotension.  6.  Chronic atrial fibrillation Rate controlled. Anticoagulation with Xarelto resumed.  Pharmacist assisting with appropriate dosing.   DVT prophylaxis; patient already on Xarelto    All the records are reviewed and case discussed with Care Management/Social Worker. Management plans discussed with the patient, family and they are in agreement.  CODE STATUS: DNR  TOTAL TIME TAKING CARE OF THIS PATIENT: 37 minutes.   More than 50% of the time was spent in counseling/coordination of care: YES  POSSIBLE D/C IN 2 DAYS, DEPENDING ON CLINICAL CONDITION.   Poppi Scantling M.D on 07/29/2018 at 11:23 AM  Between 7am to 6pm - Pager - [phone]  After 6pm go to [email or web address] - Technical brewer [facility]  Office  [phone]  CC: Primary care physician; [name], MD  Note: This dictation was prepared with Dragon dictation along with smaller phrase technology. Any transcriptional errors that result from this process are unintentional.

## 2018-07-30 LAB — BASIC METABOLIC PANEL
Anion gap: 8 (ref 5–15)
BUN: 17 mg/dL (ref 8–23)
CO2: 22 mmol/L (ref 22–32)
Calcium: 8.4 mg/dL — ABNORMAL LOW (ref 8.9–10.3)
Chloride: 106 mmol/L (ref 98–111)
Creatinine, Ser: 1.12 mg/dL (ref 0.61–1.24)
GFR calc Af Amer: >60 mL/min (ref >60)
GFR calc non Af Amer: >60 mL/min (ref >60)
Glucose, Bld: 95 mg/dL (ref 70–99)
Potassium: 4 mmol/L (ref 3.5–5.1)
Sodium: 136 mmol/L (ref 135–145)

## 2018-07-30 LAB — CBC
HCT: 38.8 % — ABNORMAL LOW (ref 39.0–52.0)
Hemoglobin: 12.4 g/dL — ABNORMAL LOW (ref 13.0–17.0)
MCH: 32.1 pg (ref 26.0–34.0)
MCHC: 32 g/dL (ref 30.0–36.0)
MCV: 100.5 fL — ABNORMAL HIGH (ref 80.0–100.0)
Platelets: 166 10*3/uL (ref 150–400)
RBC: 3.86 MIL/uL — ABNORMAL LOW (ref 4.22–5.81)
RDW: 16.5 % — ABNORMAL HIGH (ref 11.5–15.5)
WBC: 7.5 10*3/uL (ref 4.0–10.5)
nRBC: 0 % (ref 0.0–0.2)

## 2018-07-30 LAB — MAGNESIUM: Magnesium: 2.4 mg/dL (ref 1.7–2.4)

## 2018-07-30 MED ORDER — CEFAZOLIN SODIUM-DEXTROSE 2-4 GM/100ML-% IV SOLN
2.0000 g | Freq: Three times a day (TID) | INTRAVENOUS | Status: DC
  Administered 2018-07-30 – 2018-08-03 (×12): 2 g via INTRAVENOUS
  Filled 2018-07-30 (×15): qty 100

## 2018-07-30 MED ORDER — TORSEMIDE 20 MG PO TABS
20.0000 mg | ORAL_TABLET | Freq: Two times a day (BID) | ORAL | Status: DC
  Administered 2018-07-30 – 2018-08-01 (×4): 20 mg via ORAL
  Filled 2018-07-30 (×5): qty 1

## 2018-07-30 MED ORDER — METOPROLOL TARTRATE 50 MG PO TABS
100.0000 mg | ORAL_TABLET | Freq: Two times a day (BID) | ORAL | Status: DC
  Administered 2018-07-30 – 2018-08-03 (×8): 100 mg via ORAL
  Filled 2018-07-30 (×8): qty 2

## 2018-07-30 MED ORDER — SPIRONOLACTONE 25 MG PO TABS
25.0000 mg | ORAL_TABLET | Freq: Two times a day (BID) | ORAL | Status: DC
  Administered 2018-07-30 – 2018-08-01 (×6): 25 mg via ORAL
  Filled 2018-07-30 (×6): qty 1

## 2018-07-30 MED ORDER — CEFAZOLIN SODIUM-DEXTROSE 1-4 GM/50ML-% IV SOLN
1.0000 g | Freq: Once | INTRAVENOUS | Status: AC
  Administered 2018-07-30: 1 g via INTRAVENOUS
  Filled 2018-07-30: qty 50

## 2018-07-30 MED ORDER — METOLAZONE 2.5 MG PO TABS
2.5000 mg | ORAL_TABLET | Freq: Once | ORAL | Status: AC
  Administered 2018-07-30: 2.5 mg via ORAL
  Filled 2018-07-30: qty 1

## 2018-07-30 NOTE — Evaluation (Signed)
Physical Therapy Evaluation Patient Details Name: Daniel Eaton. MRN: [id number] DOB: Oct 30, 1940 Today's Date: 07/30/2018   History of Present Illness  Patient is a pleasant 78 year old male who presented to the ED for generalized weakness, fevers, redness and warmth of BLE's. PMH includes CHF, chronic A fib on anticoagulation w [place], COPD, HTN, Hypothyroidism, HLD, and stroke. ,  Clinical Impression  Patient is a pleasant 78 year old male presenting with generalized weakness and limited mobility. Patient is at high risk for falls at this time as seen in need for BUE support for short duration ambulation with shaking of entire body with fatigue. Sp02 and HR monitored throughout session with patient requiring verbal cueing for breathing technique to increase Sp02>90 with mobility, HR spikes to 120 with ambulation but immediately reduced with sitting rest break. At this time patient would benefit from skilled physical therapy while hospitalized to increase mobility, strength, balance, and decrease falls risk. Patient will benefit from SNF placement upon discharge due to high fall risk and instability making him not safe for home at this time.     Follow Up Recommendations SNF    Equipment Recommendations  Other (comment)(if SNF nothing, if home needs hospital bed and [facility])    Recommendations for Other Services OT consult     Precautions / Restrictions Precautions Precautions: Fall Restrictions Weight Bearing Restrictions: No      Mobility  Bed Mobility Overal bed mobility: Needs Assistance Bed Mobility: Supine to Sit;Sit to Supine     Supine to sit: Min assist;HOB elevated Sit to supine: Mod assist   General bed mobility comments: Patient requires Mod A to return LE's onto bed after sitting EOB  Transfers Overall transfer level: Needs assistance Equipment used: Rolling walker (2 wheeled) Transfers: Sit to/from Stand Sit to Stand: Min assist;From elevated surface;Max  assist         General transfer comment: From elevated surface: Min A/CGA , unable to perform from standard height.  Ambulation/Gait Ambulation/Gait assistance: Min guard Gait Distance (Feet): 20 Feet Assistive device: Rolling walker (2 wheeled) Gait Pattern/deviations: Step-through pattern;Decreased weight shift to left;Decreased stance time - left;Shuffle;Trunk flexed Gait velocity: decreased      Stairs            Wheelchair Mobility    Modified Rankin (Stroke Patients Only)       Balance Overall balance assessment: Needs assistance Sitting-balance support: Feet supported Sitting balance-Leahy Scale: Fair Sitting balance - Comments: able to reach within BOS, not able to reach outside BOS   Standing balance support: Single extremity supported;Bilateral upper extremity supported;During functional activity Standing balance-Leahy Scale: Fair Standing balance comment: Patient is able to static stand with SUE support however has heavy reliance upon UE's for ambulation.                             Pertinent Vitals/Pain Pain Assessment: Faces Faces Pain Scale: Hurts little more Pain Location: LE's with weightbearing Pain Descriptors / Indicators: Aching;Grimacing Pain Intervention(s): Monitored during session;Limited activity within patient's tolerance;Repositioned    Home Living Family/patient expects to be discharged to:: Private residence Living Arrangements: Spouse/significant other Available Help at Discharge: Family Type of Home: House Home Access: Stairs to enter Entrance Stairs-Rails: Psychiatric nurse of Steps: 4 Home Layout: One level Home Equipment: Environmental consultant - 4 wheels;Cane - single point Additional Comments: Patient uses a rollator at baseline, has a cane as well. 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  ADL's / Homemaking Assistance Needed: wife assists with homemaking        Hand Dominance        Extremity/Trunk Assessment   Upper Extremity Assessment Upper Extremity Assessment: Defer to OT evaluation    Lower Extremity Assessment Lower Extremity Assessment: Generalized weakness;RLE deficits/detail;LLE deficits/detail RLE Deficits / Details: 3/5; impaired by body habitus and pain RLE Sensation: decreased light touch RLE Coordination: decreased gross motor LLE Deficits / Details: 3-/5; impaired by body habitus and pain LLE Sensation: decreased light touch LLE Coordination: decreased gross motor       Communication   Communication: HOH  Cognition Arousal/Alertness: Awake/alert Behavior During Therapy: WFL for tasks assessed/performed Overall Cognitive Status: Within Functional Limits for tasks assessed                                 General Comments: Patient A and Ox4,      General Comments General comments (skin integrity, edema, etc.): noted bilateral LE edema and redness    Exercises Total Joint Exercises Ankle Circles/Pumps: Strengthening;Both;10 reps;Supine Marching in Standing: Strengthening;Both;10 reps Other Exercises Other Exercises: patient educated in safe transfers and mobility with proper hand placement and use of momentum for decreased fall risk Other Exercises: standing stability with decreasing UE support Other Exercises: breathing techniques to increase Spo2>90   Assessment/Plan    PT Assessment Patient needs continued PT services  PT Problem List Decreased strength;Decreased activity tolerance;Decreased balance;Decreased coordination;Decreased mobility;Cardiopulmonary status limiting activity;Impaired sensation;Pain;Obesity        PT Treatment Interventions DME instruction;Gait training;Stair training;Functional mobility training;Neuromuscular re-education;Balance training;Therapeutic exercise;Therapeutic activities;Patient/family education;Manual techniques    PT Goals (Current goals can be found in the Care Plan section)  Acute Rehab PT Goals Patient Stated Goal: to get stronger again and walk PT Goal Formulation: With patient Time For Goal Achievement: 08/13/18 Potential to Achieve Goals: Fair    Frequency Min 2X/week   Barriers to discharge Inaccessible home environment;Decreased caregiver support patient is not safe for home at this time, he would benefit from skilled rehab    Co-evaluation               AM-PAC PT "6 Clicks" Mobility  Outcome Measure Help needed turning from your back to your side while in a flat bed without using bedrails?: A Little Help needed moving from lying on your back to sitting on the side of a flat bed without using bedrails?: A Lot Help needed moving to and from a bed to a chair (including a wheelchair)?: A Little Help needed standing up from a chair using your arms (e.g., wheelchair or bedside chair)?: A Lot Help needed to walk in hospital room?: A Little Help needed climbing 3-5 steps with a railing? : A Lot 6 Click Score: 15    End of Session Equipment Utilized During Treatment: Gait belt Activity Tolerance: Patient tolerated treatment well;Patient limited by pain;Patient limited by fatigue Patient left: in bed;with call bell/phone within reach;with bed alarm set Nurse Communication: Mobility status;Other (comment)(patient feeling like he has a fever increasing) PT Visit Diagnosis: Unsteadiness on feet (R26.81);Other abnormalities of gait and mobility (R26.89);Muscle weakness (  generalized) (M62.81);History of falling (Z91.81);Difficulty in walking, not elsewhere classified (R26.2);Pain Pain - Right/Left: (bilateral) Pain - part of body: Leg    Time: 1610-9604 PT  Time Calculation (min) (ACUTE ONLY): 34 min   Charges:   PT Evaluation $PT Eval Low Complexity: 1 Low PT Treatments $Therapeutic Activity: 8-22 mins        Janna Arch, PT, DPT    Janna Arch 07/30/2018, 5:52 PM

## 2018-07-30 NOTE — Progress Notes (Signed)
[facility] [place] at [place] NAME: Daniel Eaton    MR#:  [id number]  DATE OF BIRTH:  11-25-1940  SUBJECTIVE:  CHIEF COMPLAINT:   Chief Complaint  Patient presents with  . Weakness   No new complaints this morning.  No fevers overnight.  Patient however appears to have developed some increasing lower extremity edema due to being of IV fluids.  IV fluid discontinued and resumed home diuretics given history of CHF.  No shortness of breath.  REVIEW OF SYSTEMS:  Review of Systems  Constitutional: Negative for chills and fever.  HENT: Negative for hearing loss and tinnitus.   Eyes: Negative for blurred vision and double vision.  Respiratory: Negative for cough, hemoptysis and shortness of breath.   Cardiovascular: Negative for palpitations.  Gastrointestinal: Negative for heartburn and nausea.  Genitourinary: Negative for urgency.  Musculoskeletal: Negative for back pain.       Less redness and warmth on both lower extremity  Skin: Negative for itching and rash.  Neurological: Negative for dizziness and headaches.  Psychiatric/Behavioral: Negative for depression and substance abuse.    DRUG ALLERGIES:   Allergies  Allergen Reactions  . Diltiazem Hcl Itching and Other (See Comments)    edema   VITALS:  Blood pressure (!) 151/97, pulse 76, temperature 97.8 F (36.6 C), temperature source Oral, resp. rate 18, height 5\' 11"  (1.803 m), weight (!) 158.8 kg, SpO2 95 %. PHYSICAL EXAMINATION:   Physical Exam  Constitutional: He is oriented to person, place, and time. He appears well-developed.  HENT:  Head: Normocephalic and atraumatic.  Right Ear: External ear normal.  Eyes: Pupils are equal, round, and reactive to light. Conjunctivae are normal. Right eye exhibits no discharge.  Neck: Normal range of motion. Neck supple. No tracheal deviation present.  Cardiovascular:  No murmur heard. Irregularly irregular  Respiratory: Effort normal and  breath sounds normal. He has no wheezes.  GI: Soft. Bowel sounds are normal. There is no abdominal tenderness.  Musculoskeletal: Normal range of motion.        General: No tenderness.     Comments: Chronic venous stasis changes with areas of redness and differential warmth on both lower extremity; improved  Neurological: He is alert and oriented to person, place, and time. No cranial nerve deficit.  Skin: Skin is warm. He is not diaphoretic. No erythema.  Psychiatric: He has a normal mood and affect. His behavior is normal.   LABORATORY PANEL:  Male CBC Recent Labs  Lab 07/30/18 0411  WBC 7.5  HGB 12.4*  HCT 38.8*  PLT 166   ------------------------------------------------------------------------------------------------------------------ Chemistries  Recent Labs  Lab 07/30/18 0411  NA 136  K 4.0  CL 106  CO2 22  GLUCOSE 95  BUN 17  CREATININE 1.12  CALCIUM 8.4*  MG 2.4   RADIOLOGY:  No results found. ASSESSMENT AND PLAN:   Patient is a 78 year old male with history of chronic diastolic CHF, chronic atrial fibrillation on anticoagulation with Xarelto, COPD and hypothyroidism admitted to medical service on account of sepsis secondary to bilateral lower extremity cellulitis  1.  Sepsis secondary to bilateral lower extremity cellulitis Patient presented with fevers with temperature of 102.7.  Evidence of leukocytosis with white count of 15,000. Hemodynamically stable at this time.  Patient remains has remained afebrile.  Clinically improving with evidence of improvement in bilateral lower extremity cellulitis.  Leukocytosis resolved. Initiated de-escalation of antibiotics by discontinuing vancomycin and cefepime.  Patient started on IV cefazolin.  Gentle IV fluid hydration while monitoring cardiopulmonary status closely Monitor clinically.  2.  Chronic diastolic CHF Stable at this time. Discontinued IV fluids.  Resumed home dose of diuretics including metolazone and  spironolactone as well as torsemide  3.  COPD Stable  4.  Hypothyroidism Continue Synthroid.  TSH level normal  5.  Hypertension Blood pressure trending up.  Increased metoprolol back to home dose of 100 mg p.o. twice daily  6.  Chronic atrial fibrillation Rate controlled. Anticoagulation with Xarelto resumed.  Pharmacist assisting with appropriate dosing.  7.  Debility due to multiple medical problems listed above. Wife is very concerned about patient returning home.  Physical therapy consult placed to evaluate and treat.  Patient would likely benefit from skilled nursing facility placement on discharge.  DVT prophylaxis; patient already on Xarelto    All the records are reviewed and case discussed with Care Management/Social Worker. Management plans discussed with the patient, family and they are in agreement. Called and updated patient's wife on treatment plans as outlined above.  All questions were answered   CODE STATUS: DNR  TOTAL TIME TAKING CARE OF THIS PATIENT: 36 minutes.   More than 50% of the time was spent in counseling/coordination of care: YES  POSSIBLE D/C IN 1-2 DAYS, DEPENDING ON CLINICAL CONDITION.   Tiearra Colwell M.D on 07/30/2018 at 2:47 PM  Between 7am to 6pm - Pager - [phone]  After 6pm go to [email or web address] - Technical brewer [facility]  Office  [phone]  CC: Primary care physician; [name], MD  Note: This dictation was prepared with Dragon dictation along with smaller phrase technology. Any transcriptional errors that result from this process are unintentional.

## 2018-07-30 NOTE — Progress Notes (Signed)
Pharmacy - Brief Note  Patient on cefazolin1gm IV q8h  for cellulitis.  Plan: Based on obesity, increase dose to cefazolin 2gm IV q8h  Doreene Eland, PharmD, BCPS.   Work Cell: [phone] 07/30/2018 12:31 PM

## 2018-07-31 LAB — ROCKY MTN SPOTTED FVR ABS PNL(IGG+IGM)
RMSF IgG: NEGATIVE
RMSF IgM: 0.2 index (ref 0.00–0.89)

## 2018-07-31 LAB — CULTURE, BLOOD (ROUTINE X 2): Special Requests: ADEQUATE

## 2018-07-31 NOTE — Care Management Important Message (Signed)
Important Message  Patient Details  Name: Daniel Eaton. MRN: [id number] Date of Birth: 09/07/1940   Medicare Important Message Given:  Yes    [name] 07/31/2018, 11:07 AM

## 2018-07-31 NOTE — Progress Notes (Addendum)
[place] at [place] NAME: Daniel Eaton    MR#:  [id number]  DATE OF BIRTH:  1940/11/21  SUBJECTIVE:  CHIEF COMPLAINT:   Chief Complaint  Patient presents with  . Weakness   No new complaints this morning.  No fevers overnight.  Lower extremity edema improved with discontinuation of IV fluid and resumption of home diuretics.   REVIEW OF SYSTEMS:  Review of Systems  Constitutional: Negative for chills and fever.  HENT: Negative for hearing loss and tinnitus.   Eyes: Negative for blurred vision and double vision.  Respiratory: Negative for cough, hemoptysis and shortness of breath.   Cardiovascular: Negative for palpitations.  Gastrointestinal: Negative for heartburn and nausea.  Genitourinary: Negative for urgency.  Musculoskeletal: Negative for back pain.       Less redness and warmth on both lower extremity  Skin: Negative for itching and rash.  Neurological: Negative for dizziness and headaches.  Psychiatric/Behavioral: Negative for depression and substance abuse.    DRUG ALLERGIES:   Allergies  Allergen Reactions  . Diltiazem Hcl Itching and Other (See Comments)    edema   VITALS:  Blood pressure 129/68, pulse 74, temperature 97.7 F (36.5 C), temperature source Oral, resp. rate 20, height 5\' 11"  (1.803 m), weight (!) 158.8 kg, SpO2 96 %. PHYSICAL EXAMINATION:   Physical Exam  Constitutional: He is oriented to person, place, and time. He appears well-developed.  HENT:  Head: Normocephalic and atraumatic.  Right Ear: External ear normal.  Eyes: Pupils are equal, round, and reactive to light. Conjunctivae are normal. Right eye exhibits no discharge.  Neck: Normal range of motion. Neck supple. No tracheal deviation present.  Cardiovascular:  No murmur heard. Irregularly irregular  Respiratory: Effort normal and breath sounds normal. He has no wheezes.  GI: Soft. Bowel sounds are normal. There is no abdominal tenderness.   Musculoskeletal: Normal range of motion.        General: No tenderness.     Comments: Chronic venous stasis changes with areas of redness and differential warmth on both lower extremity; improved  Neurological: He is alert and oriented to person, place, and time. No cranial nerve deficit.  Skin: Skin is warm. He is not diaphoretic. No erythema.  Psychiatric: He has a normal mood and affect. His behavior is normal.   LABORATORY PANEL:  Male CBC Recent Labs  Lab 07/30/18 0411  WBC 7.5  HGB 12.4*  HCT 38.8*  PLT 166   ------------------------------------------------------------------------------------------------------------------ Chemistries  Recent Labs  Lab 07/30/18 0411  NA 136  K 4.0  CL 106  CO2 22  GLUCOSE 95  BUN 17  CREATININE 1.12  CALCIUM 8.4*  MG 2.4   RADIOLOGY:  No results found. ASSESSMENT AND PLAN:   Patient is a 78 year old male with history of chronic diastolic CHF, chronic atrial fibrillation on anticoagulation with Xarelto, COPD and hypothyroidism admitted to medical service on account of sepsis secondary to bilateral lower extremity cellulitis  1.  Sepsis secondary to bilateral lower extremity cellulitis Patient presented with fevers with temperature of 102.7.  Evidence of leukocytosis with white count of 15,000.  Leukocytosis resolved Hemodynamically stable at this time.  Patient remains has remained afebrile.  Clinically improving with evidence of improvement in bilateral lower extremity cellulitis.   Initiated de-escalation of antibiotics by discontinuing vancomycin and cefepime.  Patient started on IV cefazolin.  Gentle IV fluid hydration while monitoring cardiopulmonary status closely Monitor clinically.  2.  Chronic diastolic CHF Stable  at this time. Discontinued IV fluids previously.  Resumed home dose of diuretics including metolazone and spironolactone as well as torsemide  3.  COPD Stable  4.  Hypothyroidism Continue Synthroid.   TSH level normal  5.  Hypertension Blood pressure trending up.  Increased metoprolol back to home dose of 100 mg p.o. twice daily  6.  Chronic atrial fibrillation Rate controlled. Anticoagulation with Xarelto resumed.  Pharmacist assisting with appropriate dosing.  7.  Debility due to multiple medical problems listed above. Patient seen by physical therapist.  Skilled nursing facility placement recommended.  Case manager to work on placement.  DVT prophylaxis; patient already on Xarelto    All the records are reviewed and case discussed with Care Management/Social Worker. Management plans discussed with the patient, family and they are in agreement. Called and updated patient's wife on treatment plans as outlined above today.  All questions were answered   CODE STATUS: Full Code  Patient rescinded his DNR status today and decided to be full code going forward.  TOTAL TIME TAKING CARE OF THIS PATIENT: 36 minutes.   More than 50% of the time was spent in counseling/coordination of care: YES  POSSIBLE D/C IN 1-2 DAYS, DEPENDING ON CLINICAL CONDITION.   Daniel Eaton M.D on 07/31/2018 at 3:06 PM  Between 7am to 6pm - Pager - [phone]  After 6pm go to [email or web address] - Technical brewer [facility]  Office  [phone]  CC: Primary care physician; Daniel Ruths, MD  Note: This dictation was prepared with Dragon dictation along with smaller phrase technology. Any transcriptional errors that result from this process are unintentional.

## 2018-07-31 NOTE — NC FL2 (Signed)
[place] LEVEL OF CARE SCREENING TOOL     IDENTIFICATION  Patient Name: Daniel Eaton. Birthdate: 11-22-1940 Sex: male Admission Date (Current Location): 07/28/2018  [place] and [place] Number:  Engineering geologist and Address:  [facility], [address], [place], [place] [address]      Provider Number: [id number]  Attending Physician Name and Address:  Otila Back, MD  Relative Name and Phone Number:       Current Level of Care: Hospital Recommended Level of Care: [place] Prior Approval Number:    Date Approved/Denied:   PASRR Number: [id number]  Discharge Plan: SNF    Current Diagnoses: Patient Active Problem List   Diagnosis Date Noted  . Sepsis ([place]) 07/28/2018  . Cough 03/20/2015  . Bronchitis, chronic obstructive w acute bronchitis ([place]) 03/20/2015  . Umbilical hernia without obstruction and without gangrene 01/12/2015  . Ventral hernia without obstruction or gangrene 01/02/2015    Orientation RESPIRATION BLADDER Height & Weight     Self, Time, Place  Normal Incontinent Weight: (!) 350 lb (158.8 kg) Height:  5\' 11"  (180.3 cm)  BEHAVIORAL SYMPTOMS/MOOD NEUROLOGICAL BOWEL NUTRITION STATUS  (no issues) (none) Continent Diet(2gm na)  AMBULATORY STATUS COMMUNICATION OF NEEDS Skin   Extensive Assist Verbally Other (Comment)(bilateral lower extremities)                       Personal Care Assistance Level of Assistance  Bathing, Feeding, Dressing Bathing Assistance: Maximum assistance Feeding assistance: Limited assistance Dressing Assistance: Maximum assistance     Functional Limitations Info             SPECIAL CARE FACTORS FREQUENCY  PT (By licensed PT)                    Contractures Contractures Info: Not present    Additional Factors Info  Code Status Code Status Info: full             Current Medications (07/31/2018):  This is the current hospital active  medication list Current Facility-Administered Medications  Medication Dose Route Frequency Provider Last Rate Last Dose  . 0.9 %  sodium chloride infusion   Intravenous PRN Otila Back, MD   Stopped at 07/28/18 1757  . amiodarone (PACERONE) tablet 200 mg  200 mg Oral Daily Ojie, Jude, MD   200 mg at 07/31/18 0950  . ceFAZolin (ANCEF) IVPB 2g/100 mL premix  2 g Intravenous Q8H Berton Mount, RPH 200 mL/hr at 07/31/18 0625 2 g at 07/31/18 0625  . fluticasone furoate-vilanterol (BREO ELLIPTA) 100-25 MCG/INH 1 puff  1 puff Inhalation Daily Ojie, Jude, MD   1 puff at 07/31/18 0956   And  . umeclidinium bromide (INCRUSE ELLIPTA) 62.5 MCG/INH 1 puff  1 puff Inhalation Daily Stark Jock, Jude, MD   1 puff at 07/31/18 0956  . levothyroxine (SYNTHROID) tablet 88 mcg  88 mcg Oral Daily Stark Jock, Jude, MD   88 mcg at 07/31/18 0621  . loratadine (CLARITIN) tablet 10 mg  10 mg Oral Daily Stark Jock, Jude, MD   10 mg at 07/31/18 0949  . metoprolol tartrate (LOPRESSOR) tablet 100 mg  100 mg Oral BID Stark Jock, Jude, MD   100 mg at 07/31/18 0950  . multivitamin with minerals tablet 1 tablet  1 tablet Oral Daily Stark Jock, Jude, MD   1 tablet at 07/31/18 0949  . pantoprazole (PROTONIX) EC tablet 40 mg  40 mg Oral Daily Ojie,  Jude, MD   40 mg at 07/31/18 0949  . rivaroxaban (XARELTO) tablet 20 mg  20 mg Oral Daily Ojie, Jude, MD   20 mg at 07/30/18 1752  . spironolactone (ALDACTONE) tablet 25 mg  25 mg Oral BID Stark Jock, Jude, MD   25 mg at 07/31/18 0949  . torsemide (DEMADEX) tablet 20 mg  20 mg Oral BID Stark Jock, Jude, MD   20 mg at 07/31/18 0855  . traMADol (ULTRAM) tablet 50 mg  50 mg Oral Q6H PRN Henreitta Leber, MD   50 mg at 07/31/18 4784     Discharge Medications: Please see discharge summary for a list of discharge medications.  Relevant Imaging Results:  Relevant Lab Results:   Additional Information [id number]  Shela Leff, LCSW

## 2018-08-01 LAB — BASIC METABOLIC PANEL
Anion gap: 13 (ref 5–15)
BUN: 27 mg/dL — ABNORMAL HIGH (ref 8–23)
CO2: 31 mmol/L (ref 22–32)
Calcium: 8.8 mg/dL — ABNORMAL LOW (ref 8.9–10.3)
Chloride: 94 mmol/L — ABNORMAL LOW (ref 98–111)
Creatinine, Ser: 1.4 mg/dL — ABNORMAL HIGH (ref 0.61–1.24)
GFR calc Af Amer: 55 mL/min — ABNORMAL LOW (ref >60)
GFR calc non Af Amer: 48 mL/min — ABNORMAL LOW (ref >60)
Glucose, Bld: 110 mg/dL — ABNORMAL HIGH (ref 70–99)
Potassium: 3.2 mmol/L — ABNORMAL LOW (ref 3.5–5.1)
Sodium: 138 mmol/L (ref 135–145)

## 2018-08-01 LAB — MAGNESIUM: Magnesium: 2 mg/dL (ref 1.7–2.4)

## 2018-08-01 MED ORDER — TORSEMIDE 20 MG PO TABS
20.0000 mg | ORAL_TABLET | Freq: Every day | ORAL | Status: DC
  Filled 2018-08-01: qty 1

## 2018-08-01 MED ORDER — POLYETHYLENE GLYCOL 3350 17 G PO PACK
17.0000 g | PACK | Freq: Every day | ORAL | Status: DC
  Administered 2018-08-01 – 2018-08-02 (×2): 17 g via ORAL
  Filled 2018-08-01 (×2): qty 1

## 2018-08-01 MED ORDER — POTASSIUM CHLORIDE CRYS ER 20 MEQ PO TBCR
40.0000 meq | EXTENDED_RELEASE_TABLET | Freq: Once | ORAL | Status: AC
  Administered 2018-08-01: 40 meq via ORAL
  Filled 2018-08-01: qty 2

## 2018-08-01 MED ORDER — DOCUSATE SODIUM 100 MG PO CAPS
100.0000 mg | ORAL_CAPSULE | Freq: Two times a day (BID) | ORAL | Status: DC
  Administered 2018-08-01 – 2018-08-03 (×4): 100 mg via ORAL
  Filled 2018-08-01 (×4): qty 1

## 2018-08-01 NOTE — Progress Notes (Addendum)
Pt states "no one will help me get to the bathroom". Pt educated to call for assistance. Pt states he is worried about the condom cath falling off. Pt educated that the condom cath can be used and will stay in place if he moves. Pt speaking with MD at this time. Re-educated on the above.   Pt continued to complain about needing to get washed up, and stating "No one will help me'. Pt educated that staff are here to assist him in doing as much for himself as possible. Pt states "I'm not trying to complain, but no one will help me.". Pt educated again that he needs to do as much for himself as possible. Plan of care is for pt to go to rehab where he will need to participate fully. Pt verbalizes understanding and states that he feels staff "have an attitude". Pt educated that RN cannot control what happened prior to her shift. Pt verbalizes understanding.

## 2018-08-01 NOTE — Progress Notes (Signed)
Physical Therapy Treatment Patient Details Name: Daniel Eaton. MRN: [id number] DOB: March 31, 1940 Today's Date: 08/01/2018    History of Present Illness Patient is a pleasant 78 year old male who presented to the ED for generalized weakness, fevers, redness and warmth of BLE's. PMH includes CHF, chronic A fib on anticoagulation w [place], COPD, HTN, Hypothyroidism, HLD, and stroke. ,    PT Comments    Pt is making good progress towards goals. He is fatigued this date after just mobilizing with RN staff, wishes to perform in bed activities this date. Agreeable to there-ex. Very concerned about condom cath and is worried it will come off during session. B LE there-ex performed as well as B UE. Very pleasant and appears motivated. Will continue to progress as he tolerates.   Follow Up Recommendations  SNF     Equipment Recommendations       Recommendations for Other Services       Precautions / Restrictions Precautions Precautions: Fall Restrictions Weight Bearing Restrictions: No    Mobility  Bed Mobility               General bed mobility comments: refused secondary to just getting back in bed with RN staff. Only agreeable to bed ther-ex  Transfers                    Ambulation/Gait                 Stairs             Wheelchair Mobility    Modified Rankin (Stroke Patients Only)       Balance                                            Cognition Arousal/Alertness: Awake/alert Behavior During Therapy: WFL for tasks assessed/performed Overall Cognitive Status: Within Functional Limits for tasks assessed                                        Exercises Other Exercises Other Exercises: supine ther-ex performed on B LE including AP, quad sets, SLR, hip abd/add, and glut sets x 12 reps and cga. Also performed B UE bicep curls and attempted shoulder press, however has decreased ROM in L shoulder. 12  reps.     General Comments        Pertinent Vitals/Pain Pain Assessment: No/denies pain    Home Living                      Prior Function            PT Goals (current goals can now be found in the care plan section) Acute Rehab PT Goals Patient Stated Goal: to get stronger again and walk PT Goal Formulation: With patient Time For Goal Achievement: 08/13/18 Potential to Achieve Goals: Fair Progress towards PT goals: Progressing toward goals    Frequency    Min 2X/week      PT Plan Current plan remains appropriate    Co-evaluation              AM-PAC PT "6 Clicks" Mobility   Outcome Measure  Help needed turning from your back to your side while in a flat bed without using  bedrails?: A Little Help needed moving from lying on your back to sitting on the side of a flat bed without using bedrails?: A Lot Help needed moving to and from a bed to a chair (including a wheelchair)?: A Little Help needed standing up from a chair using your arms (e.g., wheelchair or bedside chair)?: A Lot Help needed to walk in hospital room?: A Little Help needed climbing 3-5 steps with a railing? : A Lot 6 Click Score: 15    End of Session   Activity Tolerance: Patient limited by fatigue Patient left: in bed;with call bell/phone within reach;with bed alarm set Nurse Communication: Mobility status PT Visit Diagnosis: Unsteadiness on feet (R26.81);Other abnormalities of gait and mobility (R26.89);Muscle weakness (generalized) (M62.81);History of falling (Z91.81);Difficulty in walking, not elsewhere classified (R26.2);Pain     Time: 5361-4431 PT Time Calculation (min) (ACUTE ONLY): 23 min  Charges:  $Therapeutic Exercise: 23-37 mins                     Greggory Stallion, [place], DPT [phone]    Eisa Necaise 08/01/2018, 11:57 AM

## 2018-08-01 NOTE — TOC Initial Note (Signed)
Transition of Care (TOC) - Initial/Assessment Note    Patient Details  Name: Daniel Eaton. MRN: [id number] Date of Birth: 21-Aug-1940  Transition of Care [facility]) CM/SW Contact:    Shela Leff, LCSW Phone Number: 08/01/2018, 8:55 AM  Clinical Narrative:        CSW spoke with patient's wife yesterday regarding short term rehab. Patient's wife stated that she had spoken with her husband and he now will let us look into short term rehab. CSW explained the rehab placement process and answered questions. Patient received bed offers this morning and CSW contacted patient's wife and extended the offers and reviewed the medicare.gov list of ratings. Patient's wife chose [facility]. CSW contacted Otila Kluver at [facility] to inform and also contacted [employer] and spoke with Crystal in order to begin prior auth.            Expected Discharge Plan: Skilled Nursing Facility Barriers to Discharge: Insurance Authorization   Patient Goals and CMS Choice   CMS Medicare.gov Compare Post Acute Care list provided to:: Patient Represenative (must comment) Choice offered to / list presented to : Spouse  Expected Discharge Plan and Services Expected Discharge Plan: [place]         Expected Discharge Date: 07/30/18                                    Prior Living Arrangements/Services   Lives with:: Spouse Patient language and need for interpreter reviewed:: Yes Do you feel safe going back to the place where you live?: Yes      Need for Family Participation in Patient Care: No (Comment) Care giver support system in place?: No (comment)   Criminal Activity/Legal Involvement Pertinent to Current Situation/Hospitalization: No - Comment as needed  Activities of Daily Living Home Assistive Devices/Equipment: Walker (specify type) ADL Screening (condition at time of admission) Patient's cognitive ability adequate to safely complete daily activities?: Yes Is the patient  deaf or have difficulty hearing?: Yes Does the patient have difficulty seeing, even when wearing glasses/contacts?: No Does the patient have difficulty concentrating, remembering, or making decisions?: No Patient able to express need for assistance with ADLs?: Yes Does the patient have difficulty dressing or bathing?: No Independently performs ADLs?: Yes (appropriate for developmental age) Does the patient have difficulty walking or climbing stairs?: Yes Weakness of Legs: Both Weakness of Arms/Hands: None  Permission Sought/Granted                  Emotional Assessment Appearance:: Appears stated age     Orientation: : Fluctuating Orientation (Suspected and/or reported Sundowners) Alcohol / Substance Use: Not Applicable Psych Involvement: No (comment)  Admission diagnosis:  Myalgia [M79.10] General weakness [R53.1] SIRS (systemic inflammatory response syndrome) ([place]) [R65.10] Patient Active Problem List   Diagnosis Date Noted  . Sepsis ([place]) 07/28/2018  . Cough 03/20/2015  . Bronchitis, chronic obstructive w acute bronchitis ([place]) 03/20/2015  . Umbilical hernia without obstruction and without gangrene 01/12/2015  . Ventral hernia without obstruction or gangrene 01/02/2015   PCP:  [name], MD Pharmacy:   [id number] [place], [place] [address] [place] [address] Phone: [phone] Fax: [phone]     Social Determinants of Health (SDOH) Interventions    Readmission Risk Interventions No flowsheet data found.

## 2018-08-01 NOTE — Progress Notes (Signed)
[name] at [place] NAME: Daniel Eaton    MR#:  [id number]  DATE OF BIRTH:  [birth year]  SUBJECTIVE:  CHIEF COMPLAINT:   Chief Complaint  Patient presents with  . Weakness   No new complaints this morning.  No fevers overnight.  Lower extremity edema improved with discontinuation of IV fluid and resumption of home diuretics.   REVIEW OF SYSTEMS:  Review of Systems  Constitutional: Negative for chills and fever.  HENT: Negative for hearing loss and tinnitus.   Eyes: Negative for blurred vision and double vision.  Respiratory: Negative for cough, hemoptysis and shortness of breath.   Cardiovascular: Negative for palpitations.  Gastrointestinal: Negative for heartburn and nausea.  Genitourinary: Negative for urgency.  Musculoskeletal: Negative for back pain.       Less redness and warmth on both lower extremity  Skin: Negative for itching and rash.  Neurological: Negative for dizziness and headaches.  Psychiatric/Behavioral: Negative for depression and substance abuse.    DRUG ALLERGIES:   Allergies  Allergen Reactions  . Diltiazem Hcl Itching and Other (See Comments)    edema   VITALS:  Blood pressure 121/70, pulse 66, temperature (!) 97.4 F (36.3 C), temperature source Oral, resp. rate 16, height 5\' 11"  (1.803 m), weight (!) 158.8 kg, SpO2 96 %. PHYSICAL EXAMINATION:   Physical Exam  Constitutional: He is oriented to person, place, and time. He appears well-developed.  HENT:  Head: Normocephalic and atraumatic.  Right Ear: External ear normal.  Eyes: Pupils are equal, round, and reactive to light. Conjunctivae are normal. Right eye exhibits no discharge.  Neck: Normal range of motion. Neck supple. No tracheal deviation present.  Cardiovascular:  No murmur heard. Irregularly irregular  Respiratory: Effort normal and breath sounds normal. He has no wheezes.  GI: Soft. Bowel sounds are normal. There is no abdominal  tenderness.  Musculoskeletal: Normal range of motion.        General: No tenderness.     Comments: Chronic venous stasis changes with areas of redness and differential warmth on both lower extremity; improved  Neurological: He is alert and oriented to person, place, and time. No cranial nerve deficit.  Skin: Skin is warm. He is not diaphoretic. No erythema.  Psychiatric: He has a normal mood and affect. His behavior is normal.   LABORATORY PANEL:  Male CBC Recent Labs  Lab 07/30/18 0411  WBC 7.5  HGB 12.4*  HCT 38.8*  PLT 166   ------------------------------------------------------------------------------------------------------------------ Chemistries  Recent Labs  Lab 08/01/18 0256  NA 138  K 3.2*  CL 94*  CO2 31  GLUCOSE 110*  BUN 27*  CREATININE 1.40*  CALCIUM 8.8*  MG 2.0   RADIOLOGY:  No results found. ASSESSMENT AND PLAN:   Patient is a 79 year old male with history of chronic diastolic CHF, chronic atrial fibrillation on anticoagulation with Xarelto, COPD and hypothyroidism admitted to medical service on account of sepsis secondary to bilateral lower extremity cellulitis  1.  Sepsis secondary to bilateral lower extremity cellulitis Patient presented with fevers with temperature of 102.7.  Evidence of leukocytosis with white count of 15,000.  Leukocytosis resolved Hemodynamically stable at this time.  Patient remains has remained afebrile.  Clinically improving with evidence of improvement in bilateral lower extremity cellulitis.   Initiated de-escalation of antibiotics by discontinuing vancomycin and cefepime.  Patient started on IV cefazolin.  Adequately hydrated with IV fluids which was previously discontinued.   Monitor clinically.  2.  Chronic  diastolic CHF Stable at this time. Discontinued IV fluids previously.  Resumed home dose of diuretics including metolazone and spironolactone as well as torsemide.  Decreased frequency of torsemide from twice  daily to daily due to significant diuresis and slight decline in renal function  3.  COPD Stable  4.  Hypothyroidism Continue Synthroid.  TSH level normal  5.  Hypertension Blood pressure better controlled on current regimen of metoprolol.   6.  Chronic atrial fibrillation Rate controlled. Anticoagulation with Xarelto resumed.  Pharmacist assisting with appropriate dosing.  7.  Debility due to multiple medical problems listed above. Patient seen by physical therapist.  Skilled nursing facility placement recommended.  Case manager working on placement.  DVT prophylaxis; patient already on Xarelto    All the records are reviewed and case discussed with Care Management/Social Worker. Management plans discussed with the patient, family and they are in agreement. Called and updated patient's wife on treatment plans as outlined above today.  All questions were answered   CODE STATUS: Full Code  Patient rescinded his DNR status today and decided to be full code going forward.  TOTAL TIME TAKING CARE OF THIS PATIENT: 35 minutes.   More than 50% of the time was spent in counseling/coordination of care: YES  POSSIBLE D/C IN 1-2 DAYS, DEPENDING ON CLINICAL CONDITION.   Serina Nichter M.D on 08/01/2018 at 10:22 AM  Between 7am to 6pm - Pager - [phone]  After 6pm go to [email or web address] - [employer] [facility]  Office  [phone]  CC: Primary care physician; [name], MD  Note: This dictation was prepared with Dragon dictation along with smaller phrase technology. Any transcriptional errors that result from this process are unintentional.

## 2018-08-02 LAB — CULTURE, BLOOD (ROUTINE X 2)
Culture: NO GROWTH
Special Requests: ADEQUATE

## 2018-08-02 LAB — BASIC METABOLIC PANEL
Anion gap: 12 (ref 5–15)
BUN: 36 mg/dL — ABNORMAL HIGH (ref 8–23)
CO2: 33 mmol/L — ABNORMAL HIGH (ref 22–32)
Calcium: 9 mg/dL (ref 8.9–10.3)
Chloride: 93 mmol/L — ABNORMAL LOW (ref 98–111)
Creatinine, Ser: 1.71 mg/dL — ABNORMAL HIGH (ref 0.61–1.24)
GFR calc Af Amer: 43 mL/min — ABNORMAL LOW (ref >60)
GFR calc non Af Amer: 38 mL/min — ABNORMAL LOW (ref >60)
Glucose, Bld: 116 mg/dL — ABNORMAL HIGH (ref 70–99)
Potassium: 3.5 mmol/L (ref 3.5–5.1)
Sodium: 138 mmol/L (ref 135–145)

## 2018-08-02 LAB — MAGNESIUM: Magnesium: 2.1 mg/dL (ref 1.7–2.4)

## 2018-08-02 MED ORDER — BISACODYL 10 MG RE SUPP
10.0000 mg | Freq: Once | RECTAL | Status: AC
  Administered 2018-08-02: 10 mg via RECTAL
  Filled 2018-08-02: qty 1

## 2018-08-02 MED ORDER — SODIUM CHLORIDE 0.9 % IV SOLN
INTRAVENOUS | Status: DC
  Administered 2018-08-02: 12:00:00 via INTRAVENOUS

## 2018-08-02 MED ORDER — MAGNESIUM CITRATE PO SOLN
1.0000 | Freq: Once | ORAL | Status: AC
  Administered 2018-08-02: 1 via ORAL
  Filled 2018-08-02 (×2): qty 296

## 2018-08-02 NOTE — Progress Notes (Addendum)
Pt .took mag citrate laxative orally.Awaiting for result.

## 2018-08-02 NOTE — Progress Notes (Signed)
Pt. was given dulcolax suppository and soap suds enema with no result. Pt appeared to be impacted. Attending was notified and order received.

## 2018-08-02 NOTE — Progress Notes (Signed)
[place] at [place] NAME: Daniel Eaton    MR#:  [id number]  DATE OF BIRTH:  [birth year]  SUBJECTIVE:  CHIEF COMPLAINT:   Chief Complaint  Patient presents with  . Weakness   No new complaints this morning.  No fevers overnight.  Lower extremity edema improved with discontinuation of IV fluid and resumption of home diuretics previously.   REVIEW OF SYSTEMS:  Review of Systems  Constitutional: Negative for chills and fever.  HENT: Negative for hearing loss and tinnitus.   Eyes: Negative for blurred vision and double vision.  Respiratory: Negative for cough, hemoptysis and shortness of breath.   Cardiovascular: Negative for palpitations.  Gastrointestinal: Negative for heartburn and nausea.  Genitourinary: Negative for urgency.  Musculoskeletal: Negative for back pain.       Less redness and warmth on both lower extremity improved  Skin: Negative for itching and rash.  Neurological: Negative for dizziness and headaches.  Psychiatric/Behavioral: Negative for depression and substance abuse.    DRUG ALLERGIES:   Allergies  Allergen Reactions  . Diltiazem Hcl Itching and Other (See Comments)    edema   VITALS:  Blood pressure 129/84, pulse 62, temperature 98.9 F (37.2 C), temperature source Oral, resp. rate 20, height 5\' 11"  (1.803 m), weight (!) 158.8 kg, SpO2 97 %. PHYSICAL EXAMINATION:   Physical Exam  Constitutional: He is oriented to person, place, and time. He appears well-developed.  HENT:  Head: Normocephalic and atraumatic.  Right Ear: External ear normal.  Eyes: Pupils are equal, round, and reactive to light. Conjunctivae are normal. Right eye exhibits no discharge.  Neck: Normal range of motion. Neck supple. No tracheal deviation present.  Cardiovascular:  No murmur heard. Irregularly irregular  Respiratory: Effort normal and breath sounds normal. He has no wheezes.  GI: Soft. Bowel sounds are normal. There is no  abdominal tenderness.  Musculoskeletal: Normal range of motion.        General: No tenderness.     Comments: Chronic venous stasis changes with areas of redness and differential warmth on both lower extremity; improved  Neurological: He is alert and oriented to person, place, and time. No cranial nerve deficit.  Skin: Skin is warm. He is not diaphoretic. No erythema.  Psychiatric: He has a normal mood and affect. His behavior is normal.   LABORATORY PANEL:  Male CBC Recent Labs  Lab 07/30/18 0411  WBC 7.5  HGB 12.4*  HCT 38.8*  PLT 166   ------------------------------------------------------------------------------------------------------------------ Chemistries  Recent Labs  Lab 08/02/18 0242  NA 138  K 3.5  CL 93*  CO2 33*  GLUCOSE 116*  BUN 36*  CREATININE 1.71*  CALCIUM 9.0  MG 2.1   RADIOLOGY:  No results found. ASSESSMENT AND PLAN:   Patient is a 78 year old male with history of chronic diastolic CHF, chronic atrial fibrillation on anticoagulation with Xarelto, COPD and hypothyroidism admitted to medical service on account of sepsis secondary to bilateral lower extremity cellulitis  1.  Sepsis secondary to bilateral lower extremity cellulitis Patient presented with fevers with temperature of 102.7.  Evidence of leukocytosis with white count of 15,000.  Leukocytosis resolved Hemodynamically stable at this time.  Patient remains has remained afebrile.  Clinically improving with evidence of improvement in bilateral lower extremity cellulitis.   Initiated de-escalation of antibiotics by discontinuing vancomycin and cefepime.  Patient started on IV cefazolin.  Adequately hydrated with IV fluids which was previously discontinued.   Monitor clinically.  2.  Chronic diastolic CHF Stable at this time. Discontinued IV fluids previously.  Resumed home dose of diuretics including metolazone and spironolactone as well as torsemide.  Decreased frequency of torsemide from  twice daily to daily due to significant diuresis and slight decline in renal function  3.  COPD Stable  4.  Hypothyroidism Continue Synthroid.  TSH level normal  5.  Hypertension Blood pressure better controlled on current regimen of metoprolol.   6.  Chronic atrial fibrillation Rate controlled. Anticoagulation with Xarelto resumed.  Pharmacist assisting with appropriate dosing.  7.  Debility due to multiple medical problems listed above. Patient seen by physical therapist.  Skilled nursing facility placement recommended.  Case manager working on placement.  8.  Acute kidney injury Patient diuresed a lot after resumption of his home dose of diuretics.  Spironolactone and torsemide have been discontinued.  Placed on very gentle hydration with normal saline at 50 cc an hour. Follow-up on renal function in a.m.  9.  Constipation. No bowel movement with MiraLAX yesterday.  Patient declined enema. Ordered Dulcolax suppository.  DVT prophylaxis; patient already on Xarelto  Disposition; plans for discharge tomorrow to skilled nursing facility if renal function improving  All the records are reviewed and case discussed with Care Management/Social Worker. Management plans discussed with the patient, family and they are in agreement. Called and updated patient's wife on treatment plans as outlined above yesterday..  All questions were answered .  Attempted calling today but no response.  Left a voicemail for her.  CODE STATUS: Full Code  Patient rescinded his DNR status today and decided to be full code going forward.  TOTAL TIME TAKING CARE OF THIS PATIENT: 35 minutes.   More than 50% of the time was spent in counseling/coordination of care: Melina Copa M.D on 08/02/2018 at 11:38 AM  Between 7am to 6pm - Pager - [phone]  After 6pm go to [email or web address] - [employer] [facility]  Office  [phone]  CC: Primary care  physician; [name], MD  Note: This dictation was prepared with Dragon dictation along with smaller phrase technology. Any transcriptional errors that result from this process are unintentional.

## 2018-08-03 DIAGNOSIS — R062 Wheezing: Secondary | ICD-10-CM | POA: Diagnosis not present

## 2018-08-03 DIAGNOSIS — I509 Heart failure, unspecified: Secondary | ICD-10-CM | POA: Diagnosis not present

## 2018-08-03 DIAGNOSIS — E569 Vitamin deficiency, unspecified: Secondary | ICD-10-CM | POA: Diagnosis not present

## 2018-08-03 DIAGNOSIS — J99 Respiratory disorders in diseases classified elsewhere: Secondary | ICD-10-CM | POA: Diagnosis not present

## 2018-08-03 DIAGNOSIS — R2681 Unsteadiness on feet: Secondary | ICD-10-CM | POA: Diagnosis not present

## 2018-08-03 DIAGNOSIS — L0390 Cellulitis, unspecified: Secondary | ICD-10-CM | POA: Diagnosis not present

## 2018-08-03 DIAGNOSIS — R498 Other voice and resonance disorders: Secondary | ICD-10-CM | POA: Diagnosis not present

## 2018-08-03 DIAGNOSIS — L03818 Cellulitis of other sites: Secondary | ICD-10-CM | POA: Diagnosis not present

## 2018-08-03 DIAGNOSIS — M6281 Muscle weakness (generalized): Secondary | ICD-10-CM | POA: Diagnosis not present

## 2018-08-03 DIAGNOSIS — I872 Venous insufficiency (chronic) (peripheral): Secondary | ICD-10-CM | POA: Diagnosis not present

## 2018-08-03 DIAGNOSIS — I4891 Unspecified atrial fibrillation: Secondary | ICD-10-CM | POA: Diagnosis not present

## 2018-08-03 DIAGNOSIS — R0602 Shortness of breath: Secondary | ICD-10-CM | POA: Diagnosis not present

## 2018-08-03 DIAGNOSIS — R0902 Hypoxemia: Secondary | ICD-10-CM | POA: Diagnosis not present

## 2018-08-03 DIAGNOSIS — J449 Chronic obstructive pulmonary disease, unspecified: Secondary | ICD-10-CM | POA: Diagnosis not present

## 2018-08-03 DIAGNOSIS — I5022 Chronic systolic (congestive) heart failure: Secondary | ICD-10-CM | POA: Diagnosis not present

## 2018-08-03 DIAGNOSIS — Z6841 Body Mass Index (BMI) 40.0 and over, adult: Secondary | ICD-10-CM | POA: Diagnosis not present

## 2018-08-03 DIAGNOSIS — R488 Other symbolic dysfunctions: Secondary | ICD-10-CM | POA: Diagnosis not present

## 2018-08-03 DIAGNOSIS — R6510 Systemic inflammatory response syndrome (SIRS) of non-infectious origin without acute organ dysfunction: Secondary | ICD-10-CM | POA: Diagnosis not present

## 2018-08-03 DIAGNOSIS — D649 Anemia, unspecified: Secondary | ICD-10-CM | POA: Diagnosis not present

## 2018-08-03 DIAGNOSIS — L03116 Cellulitis of left lower limb: Secondary | ICD-10-CM | POA: Diagnosis not present

## 2018-08-03 DIAGNOSIS — I5032 Chronic diastolic (congestive) heart failure: Secondary | ICD-10-CM | POA: Diagnosis not present

## 2018-08-03 DIAGNOSIS — K219 Gastro-esophageal reflux disease without esophagitis: Secondary | ICD-10-CM | POA: Diagnosis not present

## 2018-08-03 DIAGNOSIS — Z7401 Bed confinement status: Secondary | ICD-10-CM | POA: Diagnosis not present

## 2018-08-03 DIAGNOSIS — R531 Weakness: Secondary | ICD-10-CM | POA: Diagnosis not present

## 2018-08-03 DIAGNOSIS — E039 Hypothyroidism, unspecified: Secondary | ICD-10-CM | POA: Diagnosis not present

## 2018-08-03 DIAGNOSIS — R609 Edema, unspecified: Secondary | ICD-10-CM | POA: Diagnosis not present

## 2018-08-03 DIAGNOSIS — I10 Essential (primary) hypertension: Secondary | ICD-10-CM | POA: Diagnosis not present

## 2018-08-03 DIAGNOSIS — I5020 Unspecified systolic (congestive) heart failure: Secondary | ICD-10-CM | POA: Diagnosis not present

## 2018-08-03 DIAGNOSIS — J309 Allergic rhinitis, unspecified: Secondary | ICD-10-CM | POA: Diagnosis not present

## 2018-08-03 DIAGNOSIS — M2550 Pain in unspecified joint: Secondary | ICD-10-CM | POA: Diagnosis not present

## 2018-08-03 LAB — CULTURE, BLOOD (ROUTINE X 2)
Culture: NO GROWTH
Culture: NO GROWTH
Special Requests: ADEQUATE
Special Requests: ADEQUATE

## 2018-08-03 LAB — BASIC METABOLIC PANEL
Anion gap: 10 (ref 5–15)
BUN: 32 mg/dL — ABNORMAL HIGH (ref 8–23)
CO2: 32 mmol/L (ref 22–32)
Calcium: 8.6 mg/dL — ABNORMAL LOW (ref 8.9–10.3)
Chloride: 96 mmol/L — ABNORMAL LOW (ref 98–111)
Creatinine, Ser: 1.38 mg/dL — ABNORMAL HIGH (ref 0.61–1.24)
GFR calc Af Amer: 56 mL/min — ABNORMAL LOW (ref >60)
GFR calc non Af Amer: 49 mL/min — ABNORMAL LOW (ref >60)
Glucose, Bld: 108 mg/dL — ABNORMAL HIGH (ref 70–99)
Potassium: 3.5 mmol/L (ref 3.5–5.1)
Sodium: 138 mmol/L (ref 135–145)

## 2018-08-03 LAB — MAGNESIUM: Magnesium: 2.9 mg/dL — ABNORMAL HIGH (ref 1.7–2.4)

## 2018-08-03 MED ORDER — POLYETHYLENE GLYCOL 3350 17 G PO PACK: 17.0000 g | PACK | Freq: Every day | ORAL | 0 refills | Status: DC | PRN

## 2018-08-03 MED ORDER — RIVAROXABAN 20 MG PO TABS: 20.0000 mg | ORAL_TABLET | Freq: Every day | ORAL | 0 refills | Status: DC

## 2018-08-03 MED ORDER — TORSEMIDE 20 MG PO TABS: 20.0000 mg | ORAL_TABLET | Freq: Every day | ORAL | 0 refills | Status: DC

## 2018-08-03 MED ORDER — CEPHALEXIN 500 MG PO CAPS: 500.0000 mg | ORAL_CAPSULE | Freq: Four times a day (QID) | ORAL | 0 refills | Status: AC

## 2018-08-03 NOTE — Discharge Summary (Signed)
[facility] NAME: Daniel Eaton    MR#:  [id number]  DATE OF BIRTH:  11/16/1940  DATE OF ADMISSION:  07/28/2018   ADMITTING PHYSICIAN: Otila Back, MD  DATE OF DISCHARGE: 08/03/2018  PRIMARY CARE PHYSICIAN: [name], MD   ADMISSION DIAGNOSIS:  Myalgia [M79.10] General weakness [R53.1] SIRS (systemic inflammatory response syndrome) (HCC) [R65.10] DISCHARGE DIAGNOSIS:  Active Problems:   Sepsis ([place])  SECONDARY DIAGNOSIS:   Past Medical History:  Diagnosis Date  . A-fib ([place])   . CHF (congestive heart failure) ([place])   . COPD (chronic obstructive pulmonary disease) ([place])   . Difficult intubation   . Hyperlipidemia   . Hypertension   . Hypothyroidism   . Kidney disease   . Sleep apnea   . Stroke ([place])   . Thyroid disease    HOSPITAL COURSE:  Chief complaint; Fevers.  Swelling and redness of both lower extremity.  History of presenting complaint; Daniel Eaton  is a 78 y.o. male with a known history of chronic diastolic CHF, chronic atrial fibrillation on anticoagulation with Xarelto, COPD, hypertension hypothyroidism and hyperlipidemia who presented to the emergency room with complaints of 3-day history of not feeling well with generalized weakness.  Also having fevers.  Has also noticed some redness with differential warmth on both lower extremity.  Has chronic venous stasis on both lower extremity at baseline.  Patient was evaluated in the emergency room.  Noted to have fever of 102.7. COVID test done was negative.  Chest x-ray negative.  Urinalysis negative.  Patient deniedd any confusion and no evidence of confusion on evaluation.  No neck stiffness.  Laboratory studies with leukocytosis with white count of 15,000.  Patient diagnosed with sepsis secondary to bilateral lower extremity cellulitis.  Medical service called to admit patient for further evaluation and management.   Hospital course; 1.Sepsis  secondary to bilateral lower extremity cellulitis Patient presented with fevers with temperature of 102.7. Evidence of leukocytosis with white count of 15,000.  Leukocytosis resolved.  Patient has remained afebrile.  Noted significant improvement in bilateral lower extremity cellulitis on IV antibiotics.  Was initially placed on IV vancomycin and cefepime.  Antibiotics previously de-escalated to IV cefazolin.  Patient being discharged on p.o. Keflex to complete treatment duration.   Adequately hydrated with IV fluids.  Clinically and hemodynamically stable for discharge to rehab   2.Chronic diastolic CHF Stable at this time.  Decreased home dose of diuretics by stopping metolazone and spironolactone due to development of acute kidney injury.  Resumed torsemide at 20 mg daily instead of twice daily.  Outpatient monitoring by MD at rehab.    3.COPD Stable  4.Hypothyroidism Continue Synthroid. TSH level normal  5.Hypertension Blood pressure better controlled on current regimen of metoprolol.   6.  Chronic atrial fibrillation Rate controlled. Anticoagulation with Xarelto resumed.  Pharmacist placed patient on appropriate dose prior to discharge.  7.  Debility due to multiple medical problems listed above. Patient seen by physical therapist.  Skilled nursing facility placement recommended.  Patient being discharged to rehab.  8.  Acute kidney injury Patient diuresed a lot after resumption of his home dose of diuretics.  Was placed on gentle IV fluid hydration with improvement in renal function prior to discharge.  Outpatient monitoring by MD at rehab.  9.  Constipation. Patient had very good bowel movement with magnesium citrate administered yesterday.     Patient clinically and hemodynamically stable.  I called and  updated wife this morning on treatment and discharge plans for today.  She agrees with plan of care.  DISCHARGE CONDITIONS:  Stable CONSULTS OBTAINED:    DRUG ALLERGIES:   Allergies  Allergen Reactions  . Diltiazem Hcl Itching and Other (See Comments)    edema   DISCHARGE MEDICATIONS:   Allergies as of 08/03/2018      Reactions   Diltiazem Hcl Itching, Other (See Comments)   edema      Medication List    STOP taking these medications   metolazone 2.5 MG tablet Commonly known as: ZAROXOLYN   spironolactone 25 MG tablet Commonly known as: ALDACTONE     TAKE these medications   amiodarone 200 MG tablet Commonly known as: PACERONE Take 200 mg by mouth daily.   cephALEXin 500 MG capsule Commonly known as: KEFLEX Take 1 capsule (500 mg total) by mouth 4 (four) times daily for 1 day.   levothyroxine 88 MCG tablet Commonly known as: SYNTHROID Take 88 mcg by mouth daily.   loratadine 10 MG tablet Commonly known as: CLARITIN Take 10 mg by mouth daily.   metoprolol tartrate 100 MG tablet Commonly known as: LOPRESSOR Take 100 mg by mouth 2 (two) times daily.   Multi-Vitamins Tabs Take 1 tablet by mouth daily.   OMEGA 3 PO Take 2 capsules by mouth daily.   omeprazole 40 MG capsule Commonly known as: PRILOSEC Take 40 mg by mouth daily.   polyethylene glycol 17 g packet Commonly known as: MIRALAX / GLYCOLAX Take 17 g by mouth daily as needed.   potassium chloride 10 MEQ CR capsule Commonly known as: MICRO-K TAKE 2 CAPSULES (20 MEQ TOTAL) BY MOUTH 3 (THREE) TIMES DAILY   rivaroxaban 20 MG Tabs tablet Commonly known as: XARELTO Take 1 tablet (20 mg total) by mouth daily. What changed:   medication strength  how much to take   torsemide 20 MG tablet Commonly known as: DEMADEX Take 1 tablet (20 mg total) by mouth daily. What changed: when to take this   Trelegy Ellipta 100-62.5-25 MCG/INH Aepb Generic drug: Fluticasone-Umeclidin-Vilant Inhale 1 puff into the lungs daily.        DISCHARGE INSTRUCTIONS:   DIET:  Cardiac diet DISCHARGE CONDITION:  Stable ACTIVITY:  Activity as tolerated OXYGEN:   Home Oxygen: No.  Oxygen Delivery: room air DISCHARGE LOCATION:  nursing home   If you experience worsening of your admission symptoms, develop shortness of breath, life threatening emergency, suicidal or homicidal thoughts you must seek medical attention immediately by calling 911 or calling your MD immediately  if symptoms less severe.  You Must read complete instructions/literature along with all the possible adverse reactions/side effects for all the Medicines you take and that have been prescribed to you. Take any new Medicines after you have completely understood and accpet all the possible adverse reactions/side effects.   Please note  You were cared for by a hospitalist during your hospital stay. If you have any questions about your discharge medications or the care you received while you were in the hospital after you are discharged, you can call the unit and asked to speak with the hospitalist on call if the hospitalist that took care of you is not available. Once you are discharged, your primary care physician will handle any further medical issues. Please note that NO REFILLS for any discharge medications will be authorized once you are discharged, as it is imperative that you return to your primary care physician (or establish a relationship  with a primary care physician if you do not have one) for your aftercare needs so that they can reassess your need for medications and monitor your lab values.    On the day of Discharge:  VITAL SIGNS:  Blood pressure 123/77, pulse 64, temperature (!) 97.5 F (36.4 C), temperature source Axillary, resp. rate 20, height 5\' 11"  (1.803 m), weight (!) 158.8 kg, SpO2 91 %. PHYSICAL EXAMINATION:  GENERAL:  78 y.o.-year-old patient lying in the bed with no acute distress.  EYES: Pupils equal, round, reactive to light and accommodation. No scleral icterus. Extraocular muscles intact.  HEENT: Head atraumatic, normocephalic. Oropharynx and nasopharynx  clear.  NECK:  Supple, no jugular venous distention. No thyroid enlargement, no tenderness.  LUNGS: Normal breath sounds bilaterally, no wheezing, rales,rhonchi or crepitation. No use of accessory muscles of respiration.  CARDIOVASCULAR: Irregularly irregular.    ABDOMEN: Soft, non-tender, non-distended. Bowel sounds present. No organomegaly or mass.  EXTREMITIES: Chronic venous stasis changes with areas of redness and differential warmth on both lower extremity; significantly improved  .  NEUROLOGIC: Cranial nerves II through XII are intact. Muscle strength 5/5 in all extremities. Sensation intact. Gait not checked.  PSYCHIATRIC: The patient is alert and oriented x 3.  SKIN: No obvious rash, lesion, or ulcer.  DATA REVIEW:   CBC Recent Labs  Lab 07/30/18 0411  WBC 7.5  HGB 12.4*  HCT 38.8*  PLT 166    Chemistries  Recent Labs  Lab 08/03/18 0422  NA 138  K 3.5  CL 96*  CO2 32  GLUCOSE 108*  BUN 32*  CREATININE 1.38*  CALCIUM 8.6*  MG 2.9*     Microbiology Results  Results for orders placed or performed during the hospital encounter of 07/28/18  Blood culture (routine x 2)     Status: None   Collection Time: 07/28/18  8:15 AM   Specimen: BLOOD  Result Value Ref Range Status   Specimen Description BLOOD L AC  Final   Special Requests   Final    BOTTLES DRAWN AEROBIC AND ANAEROBIC Blood Culture adequate volume   Culture   Final    NO GROWTH 5 DAYS Performed at [facility], [address], [place], [place] [address]    Report Status 08/02/2018 FINAL  Final  Blood culture (routine x 2)     Status: Abnormal   Collection Time: 07/28/18  8:20 AM   Specimen: BLOOD  Result Value Ref Range Status   Specimen Description   Final    BLOOD BLOOD LEFT ARM Performed at [facility], [address] [place], [place] [address]    Special Requests   Final    BOTTLES DRAWN AEROBIC AND ANAEROBIC Blood Culture adequate volume Performed at [facility], [address] [place], [place] [address]    Culture  Setup Time   Final    GRAM POSITIVE COCCI AEROBIC BOTTLE ONLY CRITICAL RESULT CALLED TO, READ BACK BY AND VERIFIED WITH: LISA KLUTTZ AT 0518 07/29/2018 SDR    Culture (A)  Final    STAPHYLOCOCCUS SPECIES (COAGULASE NEGATIVE) THE SIGNIFICANCE OF ISOLATING THIS ORGANISM FROM A SINGLE SET OF BLOOD CULTURES WHEN MULTIPLE SETS ARE DRAWN IS UNCERTAIN. PLEASE NOTIFY THE MICROBIOLOGY DEPARTMENT WITHIN ONE WEEK IF SPECIATION AND SENSITIVITIES ARE REQUIRED. Performed at [facility], [place] [address] [place],  [address]    Report Status 07/31/2018 FINAL  Final  Blood Culture ID Panel (Reflexed)     Status: Abnormal  Collection Time: 07/28/18  8:20 AM  Result Value Ref Range Status   Enterococcus species NOT DETECTED NOT DETECTED Final   Listeria monocytogenes NOT DETECTED NOT DETECTED Final   Staphylococcus species DETECTED (A) NOT DETECTED Final    Comment: Methicillin (oxacillin) susceptible coagulase negative staphylococcus. Possible blood culture contaminant (unless isolated from more than one blood culture draw or clinical case suggests pathogenicity). No antibiotic treatment is indicated for blood  culture contaminants. CRITICAL RESULT CALLED TO, READ BACK BY AND VERIFIED WITH:  LISA KLUTTZ AT 0518 07/29/2018 SDR    Staphylococcus aureus (BCID) NOT DETECTED NOT DETECTED Final   Methicillin resistance NOT DETECTED NOT DETECTED Final   Streptococcus species NOT DETECTED NOT DETECTED Final   Streptococcus agalactiae NOT DETECTED NOT DETECTED Final   Streptococcus pneumoniae NOT DETECTED NOT DETECTED Final   Streptococcus pyogenes NOT DETECTED NOT DETECTED Final   Acinetobacter baumannii NOT DETECTED NOT DETECTED Final   Enterobacteriaceae species NOT DETECTED NOT DETECTED Final   Enterobacter cloacae complex NOT DETECTED NOT DETECTED Final   Escherichia coli NOT DETECTED NOT DETECTED Final   Klebsiella oxytoca NOT  DETECTED NOT DETECTED Final   Klebsiella pneumoniae NOT DETECTED NOT DETECTED Final   Proteus species NOT DETECTED NOT DETECTED Final   Serratia marcescens NOT DETECTED NOT DETECTED Final   Haemophilus influenzae NOT DETECTED NOT DETECTED Final   Neisseria meningitidis NOT DETECTED NOT DETECTED Final   Pseudomonas aeruginosa NOT DETECTED NOT DETECTED Final   Candida albicans NOT DETECTED NOT DETECTED Final   Candida glabrata NOT DETECTED NOT DETECTED Final   Candida krusei NOT DETECTED NOT DETECTED Final   Candida parapsilosis NOT DETECTED NOT DETECTED Final   Candida tropicalis NOT DETECTED NOT DETECTED Final    Comment: Performed at [facility], [address] [place], [place] [address]  SARS Coronavirus 2 (CEPHEID- Performed in [place] hospital lab), Hosp Order     Status: None   Collection Time: 07/28/18  8:43 AM   Specimen: Nasopharyngeal Swab  Result Value Ref Range Status   SARS Coronavirus 2 NEGATIVE NEGATIVE Final    Comment: (NOTE) If result is NEGATIVE SARS-CoV-2 target nucleic acids are NOT DETECTED. The SARS-CoV-2 RNA is generally detectable in upper and lower  respiratory specimens during the acute phase of infection. The lowest  concentration of SARS-CoV-2 viral copies this assay can detect is 250  copies / mL. A negative result does not preclude SARS-CoV-2 infection  and should not be used as the sole basis for treatment or other  patient management decisions.  A negative result may occur with  improper specimen collection / handling, submission of specimen other  than nasopharyngeal swab, presence of viral mutation(s) within the  areas targeted by this assay, and inadequate number of viral copies  (<250 copies / mL). A negative result must be combined with clinical  observations, patient history, and epidemiological information. If result is POSITIVE SARS-CoV-2 target nucleic acids are DETECTED. The SARS-CoV-2 RNA is generally detectable in upper and  lower  respiratory specimens dur ing the acute phase of infection.  Positive  results are indicative of active infection with SARS-CoV-2.  Clinical  correlation with patient history and other diagnostic information is  necessary to determine patient infection status.  Positive results do  not rule out bacterial infection or co-infection with other viruses. If result is PRESUMPTIVE POSTIVE SARS-CoV-2 nucleic acids MAY BE PRESENT.   A presumptive positive result was obtained on the submitted specimen  and confirmed on repeat  testing.  While 2019 novel coronavirus  (SARS-CoV-2) nucleic acids may be present in the submitted sample  additional confirmatory testing may be necessary for epidemiological  and / or clinical management purposes  to differentiate between  SARS-CoV-2 and other Sarbecovirus currently known to infect humans.  If clinically indicated additional testing with an alternate test  methodology [phone]) is advised. The SARS-CoV-2 RNA is generally  detectable in upper and lower respiratory sp ecimens during the acute  phase of infection. The expected result is Negative. Fact Sheet for Patients:  StrictlyIdeas.no Fact Sheet for Healthcare Providers: BankingDealers.co.za This test is not yet approved or cleared by the [place] FDA and has been authorized for detection and/or diagnosis of SARS-CoV-2 by FDA under an Emergency Use Authorization (EUA).  This EUA will remain in effect (meaning this test can be used) for the duration of the COVID-19 declaration under Section 564(b)(1) of the Act, 21 U.S.C. section 360bbb-3(b)(1), unless the authorization is terminated or revoked sooner. Performed at [facility], [address] [place], [place] [address]   CULTURE, BLOOD (ROUTINE X 2) w Reflex to ID Panel     Status: None   Collection Time: 07/29/18 11:39 AM   Specimen: BLOOD  Result Value Ref Range Status    Specimen Description BLOOD R HAND  Final   Special Requests   Final    BOTTLES DRAWN AEROBIC AND ANAEROBIC Blood Culture adequate volume   Culture   Final    NO GROWTH 5 DAYS Performed at [facility], [address] [place], [place] [address]    Report Status 08/03/2018 FINAL  Final  CULTURE, BLOOD (ROUTINE X 2) w Reflex to ID Panel     Status: None   Collection Time: 07/29/18 11:52 AM   Specimen: BLOOD  Result Value Ref Range Status   Specimen Description BLOOD R WRIST  Final   Special Requests   Final    BOTTLES DRAWN AEROBIC AND ANAEROBIC Blood Culture adequate volume   Culture   Final    NO GROWTH 5 DAYS Performed at [facility], [address], [place], [place] [address]    Report Status 08/03/2018 FINAL  Final    RADIOLOGY:  No results found.   Management plans discussed with the patient, family and they are in agreement.  CODE STATUS: Full Code   TOTAL TIME TAKING CARE OF THIS PATIENT: 39 minutes.    Jasaiah Karwowski M.D on 08/03/2018 at 10:30 AM  Between 7am to 6pm - Pager - [phone]  After 6pm go to [email or web address] - Technical brewer [facility]  Office  [phone]  CC: Primary care physician; [name], MD   Note: This dictation was prepared with Dragon dictation along with smaller phrase technology. Any transcriptional errors that result from this process are unintentional.

## 2018-08-03 NOTE — Progress Notes (Signed)
Daniel Eaton. to be D/C'd to [facility] per MD order.  Discussed prescriptions and follow up appointments with the patient. Prescriptions given to patient, medication list explained in detail. Pt verbalized understanding.  Allergies as of 08/03/2018      Reactions   Diltiazem Hcl Itching, Other (See Comments)   edema      Medication List    STOP taking these medications   metolazone 2.5 MG tablet Commonly known as: ZAROXOLYN   spironolactone 25 MG tablet Commonly known as: ALDACTONE     TAKE these medications   amiodarone 200 MG tablet Commonly known as: PACERONE Take 200 mg by mouth daily.   cephALEXin 500 MG capsule Commonly known as: KEFLEX Take 1 capsule (500 mg total) by mouth 4 (four) times daily for 1 day.   levothyroxine 88 MCG tablet Commonly known as: SYNTHROID Take 88 mcg by mouth daily.   loratadine 10 MG tablet Commonly known as: CLARITIN Take 10 mg by mouth daily.   metoprolol tartrate 100 MG tablet Commonly known as: LOPRESSOR Take 100 mg by mouth 2 (two) times daily.   Multi-Vitamins Tabs Take 1 tablet by mouth daily.   OMEGA 3 PO Take 2 capsules by mouth daily.   omeprazole 40 MG capsule Commonly known as: PRILOSEC Take 40 mg by mouth daily.   polyethylene glycol 17 g packet Commonly known as: MIRALAX / GLYCOLAX Take 17 g by mouth daily as needed.   potassium chloride 10 MEQ CR capsule Commonly known as: MICRO-K TAKE 2 CAPSULES (20 MEQ TOTAL) BY MOUTH 3 (THREE) TIMES DAILY   rivaroxaban 20 MG Tabs tablet Commonly known as: XARELTO Take 1 tablet (20 mg total) by mouth daily. What changed:   medication strength  how much to take   torsemide 20 MG tablet Commonly known as: DEMADEX Take 1 tablet (20 mg total) by mouth daily. What changed: when to take this   Trelegy Ellipta 100-62.5-25 MCG/INH Aepb Generic drug: Fluticasone-Umeclidin-Vilant Inhale 1 puff into the lungs daily.       Vitals:   08/03/18 0521 08/03/18 0644  BP:  123/77   Pulse: 64   Resp: 20   Temp:  (!) 97.5 F (36.4 C)  SpO2: 91%     V catheter discontinued intact. Site without signs and symptoms of complications. Dressing and pressure applied. Pt denies pain at this time. No complaints noted.  An After Visit Summary was printed and placed in packet for facility Awaiting EMS   Sophie Quiles A Nuno Brubacher

## 2018-08-03 NOTE — Care Management Important Message (Signed)
Important Message  Patient Details  Name: Daniel Eaton. MRN: [id number] Date of Birth: 1940/06/12   Medicare Important Message Given:  Yes     [name] 08/03/2018, 10:55 AM

## 2018-08-03 NOTE — TOC Progression Note (Signed)
Transition of Care (TOC) - Progression Note    Patient Details  Name: Daniel Eaton. MRN: [id number] Date of Birth: [birth year]  Transition of Care [facility]) CM/SW Contact  Shela Leff, [place] Phone Number: 08/03/2018, 9:43 AM  Clinical Narrative:   Josem Kaufmann has been obtained from insurance: [phone]. MD to consider discharging patient today.    Expected Discharge Plan: Skilled Nursing Facility Barriers to Discharge: Insurance Authorization  Expected Discharge Plan and Services Expected Discharge Plan: [place]         Expected Discharge Date: 07/30/18                                     Social Determinants of Health (SDOH) Interventions    Readmission Risk Interventions No flowsheet data found.

## 2018-08-07 DIAGNOSIS — E039 Hypothyroidism, unspecified: Secondary | ICD-10-CM | POA: Diagnosis not present

## 2018-08-07 DIAGNOSIS — J449 Chronic obstructive pulmonary disease, unspecified: Secondary | ICD-10-CM | POA: Diagnosis not present

## 2018-08-07 DIAGNOSIS — M6281 Muscle weakness (generalized): Secondary | ICD-10-CM | POA: Diagnosis not present

## 2018-08-07 DIAGNOSIS — I4891 Unspecified atrial fibrillation: Secondary | ICD-10-CM | POA: Diagnosis not present

## 2018-08-07 DIAGNOSIS — I509 Heart failure, unspecified: Secondary | ICD-10-CM | POA: Diagnosis not present

## 2018-08-07 DIAGNOSIS — I872 Venous insufficiency (chronic) (peripheral): Secondary | ICD-10-CM | POA: Diagnosis not present

## 2018-08-09 IMAGING — DX DG CHEST 1V PORT
1 series · 1 of 1 positions shown · non-contrast
Comparison: 03/19/2015.

CLINICAL DATA: 75-year-old male with dyspnea. Atrial fibrillation,
COPD and hypertension. Initial encounter.

EXAM:
PORTABLE CHEST 1 VIEW

[chest ap]
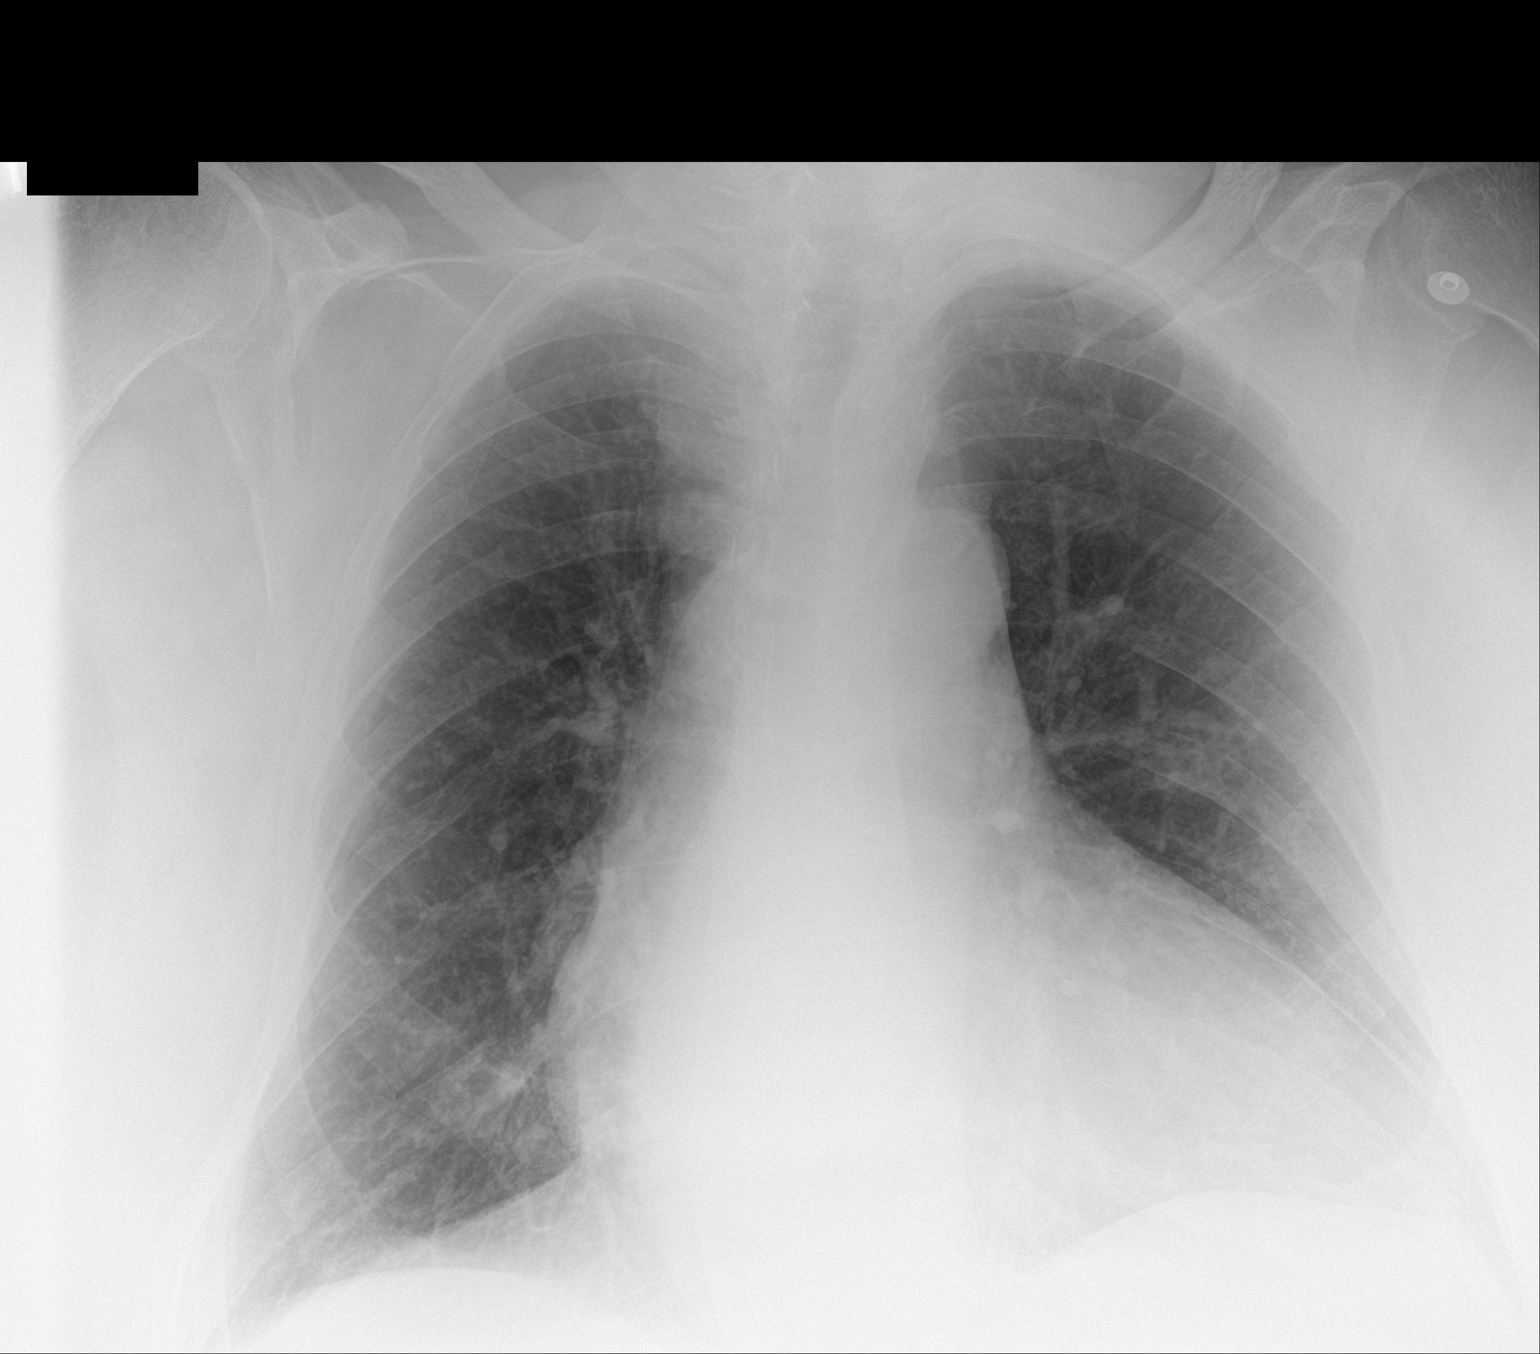

[1 of 1 positions shown; findings below may reference images not displayed]

FINDINGS: Cardiomegaly.

Central pulmonary vascular prominence without pulmonary edema.

No infiltrate/consolidation.

No plain film evidence of pulmonary malignancy.

Minimally tortuous aorta.

Minimal curvature thoracic spine convex right.
IMPRESSION: Cardiomegaly.

No infiltrate or congestive heart failure.

## 2018-08-14 DIAGNOSIS — I509 Heart failure, unspecified: Secondary | ICD-10-CM | POA: Diagnosis not present

## 2018-08-14 DIAGNOSIS — I872 Venous insufficiency (chronic) (peripheral): Secondary | ICD-10-CM | POA: Diagnosis not present

## 2018-08-14 DIAGNOSIS — L03116 Cellulitis of left lower limb: Secondary | ICD-10-CM | POA: Diagnosis not present

## 2018-08-14 DIAGNOSIS — E039 Hypothyroidism, unspecified: Secondary | ICD-10-CM | POA: Diagnosis not present

## 2018-08-14 DIAGNOSIS — M6281 Muscle weakness (generalized): Secondary | ICD-10-CM | POA: Diagnosis not present

## 2018-08-14 DIAGNOSIS — J449 Chronic obstructive pulmonary disease, unspecified: Secondary | ICD-10-CM | POA: Diagnosis not present

## 2018-08-14 DIAGNOSIS — I4891 Unspecified atrial fibrillation: Secondary | ICD-10-CM | POA: Diagnosis not present

## 2018-08-21 DIAGNOSIS — J449 Chronic obstructive pulmonary disease, unspecified: Secondary | ICD-10-CM | POA: Diagnosis not present

## 2018-08-21 DIAGNOSIS — M6281 Muscle weakness (generalized): Secondary | ICD-10-CM | POA: Diagnosis not present

## 2018-08-21 DIAGNOSIS — I509 Heart failure, unspecified: Secondary | ICD-10-CM | POA: Diagnosis not present

## 2018-08-21 DIAGNOSIS — I4891 Unspecified atrial fibrillation: Secondary | ICD-10-CM | POA: Diagnosis not present

## 2018-08-21 DIAGNOSIS — E039 Hypothyroidism, unspecified: Secondary | ICD-10-CM | POA: Diagnosis not present

## 2018-08-21 DIAGNOSIS — I872 Venous insufficiency (chronic) (peripheral): Secondary | ICD-10-CM | POA: Diagnosis not present

## 2018-08-27 DIAGNOSIS — L03116 Cellulitis of left lower limb: Secondary | ICD-10-CM | POA: Diagnosis not present

## 2018-08-27 DIAGNOSIS — R531 Weakness: Secondary | ICD-10-CM | POA: Diagnosis not present

## 2018-08-27 DIAGNOSIS — G4730 Sleep apnea, unspecified: Secondary | ICD-10-CM | POA: Diagnosis not present

## 2018-08-27 DIAGNOSIS — L03115 Cellulitis of right lower limb: Secondary | ICD-10-CM | POA: Diagnosis not present

## 2018-08-27 DIAGNOSIS — I5032 Chronic diastolic (congestive) heart failure: Secondary | ICD-10-CM | POA: Diagnosis not present

## 2018-08-27 DIAGNOSIS — Z87891 Personal history of nicotine dependence: Secondary | ICD-10-CM | POA: Diagnosis not present

## 2018-08-27 DIAGNOSIS — E785 Hyperlipidemia, unspecified: Secondary | ICD-10-CM | POA: Diagnosis not present

## 2018-08-27 DIAGNOSIS — E039 Hypothyroidism, unspecified: Secondary | ICD-10-CM | POA: Diagnosis not present

## 2018-08-27 DIAGNOSIS — K5900 Constipation, unspecified: Secondary | ICD-10-CM | POA: Diagnosis not present

## 2018-08-27 DIAGNOSIS — I110 Hypertensive heart disease with heart failure: Secondary | ICD-10-CM | POA: Diagnosis not present

## 2018-08-27 DIAGNOSIS — M791 Myalgia, unspecified site: Secondary | ICD-10-CM | POA: Diagnosis not present

## 2018-08-27 DIAGNOSIS — J449 Chronic obstructive pulmonary disease, unspecified: Secondary | ICD-10-CM | POA: Diagnosis not present

## 2018-08-27 DIAGNOSIS — I482 Chronic atrial fibrillation, unspecified: Secondary | ICD-10-CM | POA: Diagnosis not present

## 2018-08-27 DIAGNOSIS — A419 Sepsis, unspecified organism: Secondary | ICD-10-CM | POA: Diagnosis not present

## 2018-08-30 DIAGNOSIS — K5900 Constipation, unspecified: Secondary | ICD-10-CM | POA: Diagnosis not present

## 2018-08-30 DIAGNOSIS — E039 Hypothyroidism, unspecified: Secondary | ICD-10-CM | POA: Diagnosis not present

## 2018-08-30 DIAGNOSIS — L03115 Cellulitis of right lower limb: Secondary | ICD-10-CM | POA: Diagnosis not present

## 2018-08-30 DIAGNOSIS — E785 Hyperlipidemia, unspecified: Secondary | ICD-10-CM | POA: Diagnosis not present

## 2018-08-30 DIAGNOSIS — L03116 Cellulitis of left lower limb: Secondary | ICD-10-CM | POA: Diagnosis not present

## 2018-08-30 DIAGNOSIS — I110 Hypertensive heart disease with heart failure: Secondary | ICD-10-CM | POA: Diagnosis not present

## 2018-08-30 DIAGNOSIS — J449 Chronic obstructive pulmonary disease, unspecified: Secondary | ICD-10-CM | POA: Diagnosis not present

## 2018-08-30 DIAGNOSIS — R531 Weakness: Secondary | ICD-10-CM | POA: Diagnosis not present

## 2018-08-30 DIAGNOSIS — A419 Sepsis, unspecified organism: Secondary | ICD-10-CM | POA: Diagnosis not present

## 2018-08-30 DIAGNOSIS — I5032 Chronic diastolic (congestive) heart failure: Secondary | ICD-10-CM | POA: Diagnosis not present

## 2018-08-30 DIAGNOSIS — I482 Chronic atrial fibrillation, unspecified: Secondary | ICD-10-CM | POA: Diagnosis not present

## 2018-08-30 DIAGNOSIS — Z87891 Personal history of nicotine dependence: Secondary | ICD-10-CM | POA: Diagnosis not present

## 2018-08-30 DIAGNOSIS — G4730 Sleep apnea, unspecified: Secondary | ICD-10-CM | POA: Diagnosis not present

## 2018-08-30 DIAGNOSIS — M791 Myalgia, unspecified site: Secondary | ICD-10-CM | POA: Diagnosis not present

## 2018-08-31 DIAGNOSIS — N183 Chronic kidney disease, stage 3 (moderate): Secondary | ICD-10-CM | POA: Diagnosis not present

## 2018-08-31 DIAGNOSIS — I4891 Unspecified atrial fibrillation: Secondary | ICD-10-CM | POA: Diagnosis not present

## 2018-08-31 DIAGNOSIS — I129 Hypertensive chronic kidney disease with stage 1 through stage 4 chronic kidney disease, or unspecified chronic kidney disease: Secondary | ICD-10-CM | POA: Diagnosis not present

## 2018-08-31 DIAGNOSIS — J42 Unspecified chronic bronchitis: Secondary | ICD-10-CM | POA: Diagnosis not present

## 2018-08-31 DIAGNOSIS — E039 Hypothyroidism, unspecified: Secondary | ICD-10-CM | POA: Diagnosis not present

## 2018-09-11 DIAGNOSIS — I482 Chronic atrial fibrillation, unspecified: Secondary | ICD-10-CM | POA: Diagnosis not present

## 2018-09-11 DIAGNOSIS — L03116 Cellulitis of left lower limb: Secondary | ICD-10-CM | POA: Diagnosis not present

## 2018-09-11 DIAGNOSIS — L03115 Cellulitis of right lower limb: Secondary | ICD-10-CM | POA: Diagnosis not present

## 2018-09-11 DIAGNOSIS — M791 Myalgia, unspecified site: Secondary | ICD-10-CM | POA: Diagnosis not present

## 2018-09-11 DIAGNOSIS — Z87891 Personal history of nicotine dependence: Secondary | ICD-10-CM | POA: Diagnosis not present

## 2018-09-11 DIAGNOSIS — A419 Sepsis, unspecified organism: Secondary | ICD-10-CM | POA: Diagnosis not present

## 2018-09-11 DIAGNOSIS — G4730 Sleep apnea, unspecified: Secondary | ICD-10-CM | POA: Diagnosis not present

## 2018-09-11 DIAGNOSIS — I5032 Chronic diastolic (congestive) heart failure: Secondary | ICD-10-CM | POA: Diagnosis not present

## 2018-09-11 DIAGNOSIS — E785 Hyperlipidemia, unspecified: Secondary | ICD-10-CM | POA: Diagnosis not present

## 2018-09-11 DIAGNOSIS — J449 Chronic obstructive pulmonary disease, unspecified: Secondary | ICD-10-CM | POA: Diagnosis not present

## 2018-09-11 DIAGNOSIS — K5900 Constipation, unspecified: Secondary | ICD-10-CM | POA: Diagnosis not present

## 2018-09-11 DIAGNOSIS — E039 Hypothyroidism, unspecified: Secondary | ICD-10-CM | POA: Diagnosis not present

## 2018-09-11 DIAGNOSIS — R531 Weakness: Secondary | ICD-10-CM | POA: Diagnosis not present

## 2018-09-11 DIAGNOSIS — I110 Hypertensive heart disease with heart failure: Secondary | ICD-10-CM | POA: Diagnosis not present

## 2018-09-20 DIAGNOSIS — R531 Weakness: Secondary | ICD-10-CM | POA: Diagnosis not present

## 2018-09-20 DIAGNOSIS — I5032 Chronic diastolic (congestive) heart failure: Secondary | ICD-10-CM | POA: Diagnosis not present

## 2018-09-20 DIAGNOSIS — E039 Hypothyroidism, unspecified: Secondary | ICD-10-CM | POA: Diagnosis not present

## 2018-09-20 DIAGNOSIS — L03116 Cellulitis of left lower limb: Secondary | ICD-10-CM | POA: Diagnosis not present

## 2018-09-20 DIAGNOSIS — I110 Hypertensive heart disease with heart failure: Secondary | ICD-10-CM | POA: Diagnosis not present

## 2018-09-20 DIAGNOSIS — L03115 Cellulitis of right lower limb: Secondary | ICD-10-CM | POA: Diagnosis not present

## 2018-09-20 DIAGNOSIS — J449 Chronic obstructive pulmonary disease, unspecified: Secondary | ICD-10-CM | POA: Diagnosis not present

## 2018-09-20 DIAGNOSIS — K5900 Constipation, unspecified: Secondary | ICD-10-CM | POA: Diagnosis not present

## 2018-09-20 DIAGNOSIS — A419 Sepsis, unspecified organism: Secondary | ICD-10-CM | POA: Diagnosis not present

## 2018-09-20 DIAGNOSIS — I482 Chronic atrial fibrillation, unspecified: Secondary | ICD-10-CM | POA: Diagnosis not present

## 2018-09-20 DIAGNOSIS — M791 Myalgia, unspecified site: Secondary | ICD-10-CM | POA: Diagnosis not present

## 2018-09-20 DIAGNOSIS — E785 Hyperlipidemia, unspecified: Secondary | ICD-10-CM | POA: Diagnosis not present

## 2018-09-20 DIAGNOSIS — Z87891 Personal history of nicotine dependence: Secondary | ICD-10-CM | POA: Diagnosis not present

## 2018-09-20 DIAGNOSIS — G4730 Sleep apnea, unspecified: Secondary | ICD-10-CM | POA: Diagnosis not present

## 2018-09-23 DIAGNOSIS — L03116 Cellulitis of left lower limb: Secondary | ICD-10-CM | POA: Diagnosis not present

## 2018-09-23 DIAGNOSIS — I5032 Chronic diastolic (congestive) heart failure: Secondary | ICD-10-CM | POA: Diagnosis not present

## 2018-09-23 DIAGNOSIS — L03115 Cellulitis of right lower limb: Secondary | ICD-10-CM | POA: Diagnosis not present

## 2018-09-23 DIAGNOSIS — M791 Myalgia, unspecified site: Secondary | ICD-10-CM | POA: Diagnosis not present

## 2018-09-23 DIAGNOSIS — I110 Hypertensive heart disease with heart failure: Secondary | ICD-10-CM | POA: Diagnosis not present

## 2018-09-23 DIAGNOSIS — A419 Sepsis, unspecified organism: Secondary | ICD-10-CM | POA: Diagnosis not present

## 2018-09-23 DIAGNOSIS — R531 Weakness: Secondary | ICD-10-CM | POA: Diagnosis not present

## 2018-10-02 DIAGNOSIS — R2689 Other abnormalities of gait and mobility: Secondary | ICD-10-CM | POA: Insufficient documentation

## 2018-10-02 DIAGNOSIS — R5381 Other malaise: Secondary | ICD-10-CM | POA: Insufficient documentation

## 2018-10-02 DIAGNOSIS — R6889 Other general symptoms and signs: Secondary | ICD-10-CM | POA: Diagnosis not present

## 2018-10-04 DIAGNOSIS — R2689 Other abnormalities of gait and mobility: Secondary | ICD-10-CM | POA: Diagnosis not present

## 2018-10-04 DIAGNOSIS — R6889 Other general symptoms and signs: Secondary | ICD-10-CM | POA: Diagnosis not present

## 2018-10-11 DIAGNOSIS — R6889 Other general symptoms and signs: Secondary | ICD-10-CM | POA: Diagnosis not present

## 2018-10-11 DIAGNOSIS — R2689 Other abnormalities of gait and mobility: Secondary | ICD-10-CM | POA: Diagnosis not present

## 2018-10-16 DIAGNOSIS — R2689 Other abnormalities of gait and mobility: Secondary | ICD-10-CM | POA: Diagnosis not present

## 2018-10-16 DIAGNOSIS — R6889 Other general symptoms and signs: Secondary | ICD-10-CM | POA: Diagnosis not present

## 2018-10-18 DIAGNOSIS — R6889 Other general symptoms and signs: Secondary | ICD-10-CM | POA: Diagnosis not present

## 2018-10-18 DIAGNOSIS — R2689 Other abnormalities of gait and mobility: Secondary | ICD-10-CM | POA: Diagnosis not present

## 2018-10-23 DIAGNOSIS — R2689 Other abnormalities of gait and mobility: Secondary | ICD-10-CM | POA: Diagnosis not present

## 2018-10-23 DIAGNOSIS — R6889 Other general symptoms and signs: Secondary | ICD-10-CM | POA: Diagnosis not present

## 2018-10-29 DIAGNOSIS — L03116 Cellulitis of left lower limb: Secondary | ICD-10-CM | POA: Diagnosis not present

## 2018-10-29 DIAGNOSIS — I878 Other specified disorders of veins: Secondary | ICD-10-CM | POA: Diagnosis not present

## 2018-11-06 DIAGNOSIS — R6889 Other general symptoms and signs: Secondary | ICD-10-CM | POA: Diagnosis not present

## 2018-11-06 DIAGNOSIS — R2689 Other abnormalities of gait and mobility: Secondary | ICD-10-CM | POA: Diagnosis not present

## 2018-11-08 DIAGNOSIS — R6889 Other general symptoms and signs: Secondary | ICD-10-CM | POA: Diagnosis not present

## 2018-11-08 DIAGNOSIS — R2689 Other abnormalities of gait and mobility: Secondary | ICD-10-CM | POA: Diagnosis not present

## 2018-11-13 DIAGNOSIS — R2689 Other abnormalities of gait and mobility: Secondary | ICD-10-CM | POA: Diagnosis not present

## 2018-11-13 DIAGNOSIS — R6889 Other general symptoms and signs: Secondary | ICD-10-CM | POA: Diagnosis not present

## 2018-11-15 DIAGNOSIS — R2689 Other abnormalities of gait and mobility: Secondary | ICD-10-CM | POA: Diagnosis not present

## 2018-11-15 DIAGNOSIS — R6889 Other general symptoms and signs: Secondary | ICD-10-CM | POA: Diagnosis not present

## 2018-11-15 DIAGNOSIS — G4733 Obstructive sleep apnea (adult) (pediatric): Secondary | ICD-10-CM | POA: Diagnosis not present

## 2018-11-19 DIAGNOSIS — L600 Ingrowing nail: Secondary | ICD-10-CM | POA: Diagnosis not present

## 2018-11-19 DIAGNOSIS — L03039 Cellulitis of unspecified toe: Secondary | ICD-10-CM | POA: Diagnosis not present

## 2018-11-19 DIAGNOSIS — I739 Peripheral vascular disease, unspecified: Secondary | ICD-10-CM | POA: Diagnosis not present

## 2018-11-19 DIAGNOSIS — M79674 Pain in right toe(s): Secondary | ICD-10-CM | POA: Diagnosis not present

## 2018-11-19 DIAGNOSIS — E1142 Type 2 diabetes mellitus with diabetic polyneuropathy: Secondary | ICD-10-CM | POA: Diagnosis not present

## 2018-11-19 DIAGNOSIS — M2041 Other hammer toe(s) (acquired), right foot: Secondary | ICD-10-CM | POA: Diagnosis not present

## 2018-11-19 DIAGNOSIS — M2042 Other hammer toe(s) (acquired), left foot: Secondary | ICD-10-CM | POA: Diagnosis not present

## 2018-11-19 DIAGNOSIS — M79675 Pain in left toe(s): Secondary | ICD-10-CM | POA: Diagnosis not present

## 2018-11-19 DIAGNOSIS — B351 Tinea unguium: Secondary | ICD-10-CM | POA: Diagnosis not present

## 2018-11-19 DIAGNOSIS — I872 Venous insufficiency (chronic) (peripheral): Secondary | ICD-10-CM | POA: Diagnosis not present

## 2018-11-19 DIAGNOSIS — L97522 Non-pressure chronic ulcer of other part of left foot with fat layer exposed: Secondary | ICD-10-CM | POA: Diagnosis not present

## 2018-11-19 DIAGNOSIS — L03031 Cellulitis of right toe: Secondary | ICD-10-CM | POA: Diagnosis not present

## 2018-11-22 DIAGNOSIS — R2689 Other abnormalities of gait and mobility: Secondary | ICD-10-CM | POA: Diagnosis not present

## 2018-11-22 DIAGNOSIS — R6889 Other general symptoms and signs: Secondary | ICD-10-CM | POA: Diagnosis not present

## 2018-11-27 DIAGNOSIS — R6889 Other general symptoms and signs: Secondary | ICD-10-CM | POA: Diagnosis not present

## 2018-11-27 DIAGNOSIS — R2689 Other abnormalities of gait and mobility: Secondary | ICD-10-CM | POA: Diagnosis not present

## 2018-11-29 DIAGNOSIS — R2689 Other abnormalities of gait and mobility: Secondary | ICD-10-CM | POA: Diagnosis not present

## 2018-11-29 DIAGNOSIS — R6889 Other general symptoms and signs: Secondary | ICD-10-CM | POA: Diagnosis not present

## 2018-11-30 DIAGNOSIS — L03031 Cellulitis of right toe: Secondary | ICD-10-CM | POA: Diagnosis not present

## 2018-11-30 DIAGNOSIS — L97521 Non-pressure chronic ulcer of other part of left foot limited to breakdown of skin: Secondary | ICD-10-CM | POA: Diagnosis not present

## 2018-12-06 DIAGNOSIS — R2689 Other abnormalities of gait and mobility: Secondary | ICD-10-CM | POA: Diagnosis not present

## 2018-12-06 DIAGNOSIS — R6889 Other general symptoms and signs: Secondary | ICD-10-CM | POA: Diagnosis not present

## 2018-12-10 ENCOUNTER — Emergency Department: Payer: PPO

## 2018-12-10 ENCOUNTER — Other Ambulatory Visit: Payer: Self-pay

## 2018-12-10 ENCOUNTER — Inpatient Hospital Stay
Admission: EM | Admit: 2018-12-10 | Discharge: 2018-12-14 | DRG: 683 | Disposition: A | Discharge status: Skilled Nursing Facility | Payer: PPO | Attending: Internal Medicine | Admitting: Internal Medicine

## 2018-12-10 DIAGNOSIS — I10 Essential (primary) hypertension: Secondary | ICD-10-CM | POA: Diagnosis not present

## 2018-12-10 DIAGNOSIS — Y92009 Unspecified place in unspecified non-institutional (private) residence as the place of occurrence of the external cause: Secondary | ICD-10-CM | POA: Diagnosis not present

## 2018-12-10 DIAGNOSIS — M25552 Pain in left hip: Secondary | ICD-10-CM | POA: Diagnosis present

## 2018-12-10 DIAGNOSIS — G4730 Sleep apnea, unspecified: Secondary | ICD-10-CM | POA: Diagnosis not present

## 2018-12-10 DIAGNOSIS — M25561 Pain in right knee: Secondary | ICD-10-CM | POA: Diagnosis present

## 2018-12-10 DIAGNOSIS — R262 Difficulty in walking, not elsewhere classified: Secondary | ICD-10-CM | POA: Diagnosis not present

## 2018-12-10 DIAGNOSIS — N179 Acute kidney failure, unspecified: Principal | ICD-10-CM | POA: Diagnosis present

## 2018-12-10 DIAGNOSIS — Z87891 Personal history of nicotine dependence: Secondary | ICD-10-CM

## 2018-12-10 DIAGNOSIS — I248 Other forms of acute ischemic heart disease: Secondary | ICD-10-CM | POA: Diagnosis present

## 2018-12-10 DIAGNOSIS — Z6841 Body Mass Index (BMI) 40.0 and over, adult: Secondary | ICD-10-CM | POA: Diagnosis not present

## 2018-12-10 DIAGNOSIS — E7849 Other hyperlipidemia: Secondary | ICD-10-CM | POA: Diagnosis not present

## 2018-12-10 DIAGNOSIS — S79911A Unspecified injury of right hip, initial encounter: Secondary | ICD-10-CM | POA: Diagnosis not present

## 2018-12-10 DIAGNOSIS — M6282 Rhabdomyolysis: Secondary | ICD-10-CM | POA: Diagnosis present

## 2018-12-10 DIAGNOSIS — R7989 Other specified abnormal findings of blood chemistry: Secondary | ICD-10-CM | POA: Diagnosis not present

## 2018-12-10 DIAGNOSIS — M25562 Pain in left knee: Secondary | ICD-10-CM | POA: Diagnosis not present

## 2018-12-10 DIAGNOSIS — I872 Venous insufficiency (chronic) (peripheral): Secondary | ICD-10-CM | POA: Diagnosis not present

## 2018-12-10 DIAGNOSIS — Z7951 Long term (current) use of inhaled steroids: Secondary | ICD-10-CM | POA: Diagnosis not present

## 2018-12-10 DIAGNOSIS — S40812A Abrasion of left upper arm, initial encounter: Secondary | ICD-10-CM | POA: Diagnosis present

## 2018-12-10 DIAGNOSIS — Z03818 Encounter for observation for suspected exposure to other biological agents ruled out: Secondary | ICD-10-CM | POA: Diagnosis not present

## 2018-12-10 DIAGNOSIS — R000 Tachycardia, unspecified: Secondary | ICD-10-CM | POA: Diagnosis not present

## 2018-12-10 DIAGNOSIS — M25551 Pain in right hip: Secondary | ICD-10-CM | POA: Diagnosis not present

## 2018-12-10 DIAGNOSIS — I4891 Unspecified atrial fibrillation: Secondary | ICD-10-CM | POA: Diagnosis not present

## 2018-12-10 DIAGNOSIS — R52 Pain, unspecified: Secondary | ICD-10-CM | POA: Diagnosis not present

## 2018-12-10 DIAGNOSIS — Z8673 Personal history of transient ischemic attack (TIA), and cerebral infarction without residual deficits: Secondary | ICD-10-CM

## 2018-12-10 DIAGNOSIS — W19XXXA Unspecified fall, initial encounter: Secondary | ICD-10-CM

## 2018-12-10 DIAGNOSIS — E860 Dehydration: Secondary | ICD-10-CM | POA: Diagnosis not present

## 2018-12-10 DIAGNOSIS — I878 Other specified disorders of veins: Secondary | ICD-10-CM | POA: Diagnosis present

## 2018-12-10 DIAGNOSIS — Z23 Encounter for immunization: Secondary | ICD-10-CM | POA: Diagnosis not present

## 2018-12-10 DIAGNOSIS — S8992XA Unspecified injury of left lower leg, initial encounter: Secondary | ICD-10-CM | POA: Diagnosis not present

## 2018-12-10 DIAGNOSIS — D72829 Elevated white blood cell count, unspecified: Secondary | ICD-10-CM | POA: Diagnosis present

## 2018-12-10 DIAGNOSIS — R531 Weakness: Secondary | ICD-10-CM | POA: Diagnosis not present

## 2018-12-10 DIAGNOSIS — Z9181 History of falling: Secondary | ICD-10-CM | POA: Diagnosis not present

## 2018-12-10 DIAGNOSIS — Z79899 Other long term (current) drug therapy: Secondary | ICD-10-CM | POA: Diagnosis not present

## 2018-12-10 DIAGNOSIS — N1831 Chronic kidney disease, stage 3a: Secondary | ICD-10-CM | POA: Diagnosis not present

## 2018-12-10 DIAGNOSIS — K219 Gastro-esophageal reflux disease without esophagitis: Secondary | ICD-10-CM | POA: Diagnosis not present

## 2018-12-10 DIAGNOSIS — M25511 Pain in right shoulder: Secondary | ICD-10-CM | POA: Diagnosis present

## 2018-12-10 DIAGNOSIS — S40811A Abrasion of right upper arm, initial encounter: Secondary | ICD-10-CM | POA: Diagnosis not present

## 2018-12-10 DIAGNOSIS — W19XXXD Unspecified fall, subsequent encounter: Secondary | ICD-10-CM | POA: Diagnosis not present

## 2018-12-10 DIAGNOSIS — Z8249 Family history of ischemic heart disease and other diseases of the circulatory system: Secondary | ICD-10-CM

## 2018-12-10 DIAGNOSIS — S0990XA Unspecified injury of head, initial encounter: Secondary | ICD-10-CM | POA: Diagnosis not present

## 2018-12-10 DIAGNOSIS — E039 Hypothyroidism, unspecified: Secondary | ICD-10-CM | POA: Diagnosis not present

## 2018-12-10 DIAGNOSIS — I480 Paroxysmal atrial fibrillation: Secondary | ICD-10-CM | POA: Diagnosis present

## 2018-12-10 DIAGNOSIS — R609 Edema, unspecified: Secondary | ICD-10-CM | POA: Diagnosis not present

## 2018-12-10 DIAGNOSIS — J449 Chronic obstructive pulmonary disease, unspecified: Secondary | ICD-10-CM | POA: Diagnosis present

## 2018-12-10 DIAGNOSIS — S79912A Unspecified injury of left hip, initial encounter: Secondary | ICD-10-CM | POA: Diagnosis not present

## 2018-12-10 DIAGNOSIS — R0902 Hypoxemia: Secondary | ICD-10-CM | POA: Diagnosis not present

## 2018-12-10 DIAGNOSIS — S199XXA Unspecified injury of neck, initial encounter: Secondary | ICD-10-CM | POA: Diagnosis not present

## 2018-12-10 DIAGNOSIS — L0390 Cellulitis, unspecified: Secondary | ICD-10-CM | POA: Diagnosis not present

## 2018-12-10 DIAGNOSIS — Z20828 Contact with and (suspected) exposure to other viral communicable diseases: Secondary | ICD-10-CM | POA: Diagnosis not present

## 2018-12-10 DIAGNOSIS — R778 Other specified abnormalities of plasma proteins: Secondary | ICD-10-CM

## 2018-12-10 DIAGNOSIS — S43001A Unspecified subluxation of right shoulder joint, initial encounter: Secondary | ICD-10-CM | POA: Diagnosis not present

## 2018-12-10 DIAGNOSIS — I639 Cerebral infarction, unspecified: Secondary | ICD-10-CM | POA: Diagnosis not present

## 2018-12-10 DIAGNOSIS — Z7901 Long term (current) use of anticoagulants: Secondary | ICD-10-CM

## 2018-12-10 DIAGNOSIS — M2550 Pain in unspecified joint: Secondary | ICD-10-CM | POA: Diagnosis not present

## 2018-12-10 DIAGNOSIS — W010XXA Fall on same level from slipping, tripping and stumbling without subsequent striking against object, initial encounter: Secondary | ICD-10-CM | POA: Diagnosis present

## 2018-12-10 DIAGNOSIS — M6281 Muscle weakness (generalized): Secondary | ICD-10-CM | POA: Diagnosis not present

## 2018-12-10 DIAGNOSIS — N183 Chronic kidney disease, stage 3 unspecified: Secondary | ICD-10-CM | POA: Diagnosis not present

## 2018-12-10 DIAGNOSIS — Z7401 Bed confinement status: Secondary | ICD-10-CM | POA: Diagnosis not present

## 2018-12-10 HISTORY — DX: Cellulitis, unspecified: L03.90

## 2018-12-10 LAB — URINALYSIS, COMPLETE (UACMP) WITH MICROSCOPIC
Bacteria, UA: NONE SEEN
Bilirubin Urine: NEGATIVE
Glucose, UA: NEGATIVE mg/dL
Ketones, ur: NEGATIVE mg/dL
Leukocytes,Ua: NEGATIVE
Nitrite: NEGATIVE
Protein, ur: 100 mg/dL — AB
Specific Gravity, Urine: 1.018 (ref 1.005–1.030)
pH: 5 (ref 5.0–8.0)

## 2018-12-10 LAB — URINE DRUG SCREEN, QUALITATIVE (ARMC ONLY)
Amphetamines, Ur Screen: NOT DETECTED
Barbiturates, Ur Screen: NOT DETECTED
Benzodiazepine, Ur Scrn: NOT DETECTED
Cannabinoid 50 Ng, Ur ~~LOC~~: NOT DETECTED
Cocaine Metabolite,Ur ~~LOC~~: NOT DETECTED
MDMA (Ecstasy)Ur Screen: NOT DETECTED
Methadone Scn, Ur: NOT DETECTED
Opiate, Ur Screen: NOT DETECTED
Phencyclidine (PCP) Ur S: NOT DETECTED
Tricyclic, Ur Screen: NOT DETECTED

## 2018-12-10 LAB — CBC WITH DIFFERENTIAL/PLATELET
Abs Immature Granulocytes: 0.13 K/uL — ABNORMAL HIGH (ref 0.00–0.07)
Basophils Absolute: 0 K/uL (ref 0.0–0.1)
Basophils Relative: 0 %
Eosinophils Absolute: 0 K/uL (ref 0.0–0.5)
Eosinophils Relative: 0 %
HCT: 44.6 % (ref 39.0–52.0)
Hemoglobin: 14.3 g/dL (ref 13.0–17.0)
Immature Granulocytes: 1 %
Lymphocytes Relative: 5 %
Lymphs Abs: 0.7 K/uL (ref 0.7–4.0)
MCH: 32.5 pg (ref 26.0–34.0)
MCHC: 32.1 g/dL (ref 30.0–36.0)
MCV: 101.4 fL — ABNORMAL HIGH (ref 80.0–100.0)
Monocytes Absolute: 0.3 K/uL (ref 0.1–1.0)
Monocytes Relative: 2 %
Neutro Abs: 14.6 K/uL — ABNORMAL HIGH (ref 1.7–7.7)
Neutrophils Relative %: 92 %
Platelets: 207 K/uL (ref 150–400)
RBC: 4.4 MIL/uL (ref 4.22–5.81)
RDW: 15.7 % — ABNORMAL HIGH (ref 11.5–15.5)
WBC: 15.8 K/uL — ABNORMAL HIGH (ref 4.0–10.5)
nRBC: 0 % (ref 0.0–0.2)

## 2018-12-10 LAB — COMPREHENSIVE METABOLIC PANEL WITH GFR
ALT: 40 U/L (ref 0–44)
AST: 111 U/L — ABNORMAL HIGH (ref 15–41)
Albumin: 3.3 g/dL — ABNORMAL LOW (ref 3.5–5.0)
Alkaline Phosphatase: 69 U/L (ref 38–126)
Anion gap: 15 (ref 5–15)
BUN: 33 mg/dL — ABNORMAL HIGH (ref 8–23)
CO2: 24 mmol/L (ref 22–32)
Calcium: 9.4 mg/dL (ref 8.9–10.3)
Chloride: 96 mmol/L — ABNORMAL LOW (ref 98–111)
Creatinine, Ser: 2.12 mg/dL — ABNORMAL HIGH (ref 0.61–1.24)
GFR calc Af Amer: 34 mL/min — ABNORMAL LOW
GFR calc non Af Amer: 29 mL/min — ABNORMAL LOW
Glucose, Bld: 85 mg/dL (ref 70–99)
Potassium: 4.5 mmol/L (ref 3.5–5.1)
Sodium: 135 mmol/L (ref 135–145)
Total Bilirubin: 1.1 mg/dL (ref 0.3–1.2)
Total Protein: 7.3 g/dL (ref 6.5–8.1)

## 2018-12-10 LAB — TROPONIN I (HIGH SENSITIVITY)
Troponin I (High Sensitivity): 130 ng/L (ref <18)
Troponin I (High Sensitivity): 138 ng/L (ref <18)

## 2018-12-10 LAB — CK: Total CK: 5355 U/L — ABNORMAL HIGH (ref 49–397)

## 2018-12-10 MED ORDER — FLUTICASONE-UMECLIDIN-VILANT 100-62.5-25 MCG/INH IN AEPB: 1.0000 | INHALATION_SPRAY | Freq: Every day | RESPIRATORY_TRACT | Status: DC

## 2018-12-10 MED ORDER — SODIUM CHLORIDE 0.9 % IV SOLN
INTRAVENOUS | Status: DC
  Administered 2018-12-10 – 2018-12-12 (×4): via INTRAVENOUS

## 2018-12-10 MED ORDER — INFLUENZA VAC A&B SA ADJ QUAD 0.5 ML IM PRSY
0.5000 mL | PREFILLED_SYRINGE | INTRAMUSCULAR | Status: AC
  Administered 2018-12-12: 0.5 mL via INTRAMUSCULAR
  Filled 2018-12-10: qty 0.5

## 2018-12-10 MED ORDER — MORPHINE SULFATE (PF) 4 MG/ML IV SOLN
4.0000 mg | Freq: Once | INTRAVENOUS | Status: AC
  Administered 2018-12-10: 4 mg via INTRAVENOUS
  Filled 2018-12-10: qty 1

## 2018-12-10 MED ORDER — SODIUM CHLORIDE 0.9 % IV BOLUS
1000.0000 mL | Freq: Once | INTRAVENOUS | Status: AC
  Administered 2018-12-10: 1000 mL via INTRAVENOUS

## 2018-12-10 MED ORDER — LEVOTHYROXINE SODIUM 88 MCG PO TABS
88.0000 ug | ORAL_TABLET | Freq: Every day | ORAL | Status: DC
  Administered 2018-12-11 – 2018-12-14 (×4): 88 ug via ORAL
  Filled 2018-12-10 (×4): qty 1

## 2018-12-10 MED ORDER — LORATADINE 10 MG PO TABS
10.0000 mg | ORAL_TABLET | Freq: Every day | ORAL | Status: DC
  Administered 2018-12-11 – 2018-12-14 (×4): 10 mg via ORAL
  Filled 2018-12-10 (×5): qty 1

## 2018-12-10 MED ORDER — PANTOPRAZOLE SODIUM 40 MG PO TBEC
40.0000 mg | DELAYED_RELEASE_TABLET | Freq: Every day | ORAL | Status: DC
  Administered 2018-12-11 – 2018-12-14 (×4): 40 mg via ORAL
  Filled 2018-12-10 (×4): qty 1

## 2018-12-10 MED ORDER — ADULT MULTIVITAMIN W/MINERALS CH
1.0000 | ORAL_TABLET | Freq: Every day | ORAL | Status: DC
  Administered 2018-12-11 – 2018-12-14 (×4): 1 via ORAL
  Filled 2018-12-10 (×4): qty 1

## 2018-12-10 MED ORDER — ACETAMINOPHEN 650 MG RE SUPP: 650.0000 mg | Freq: Four times a day (QID) | RECTAL | Status: DC | PRN

## 2018-12-10 MED ORDER — ONDANSETRON HCL 4 MG PO TABS: 4.0000 mg | ORAL_TABLET | Freq: Four times a day (QID) | ORAL | Status: DC | PRN

## 2018-12-10 MED ORDER — ONDANSETRON HCL 4 MG/2ML IJ SOLN: 4.0000 mg | Freq: Four times a day (QID) | INTRAMUSCULAR | Status: DC | PRN

## 2018-12-10 MED ORDER — METOPROLOL TARTRATE 50 MG PO TABS
100.0000 mg | ORAL_TABLET | Freq: Two times a day (BID) | ORAL | Status: DC
  Administered 2018-12-10 – 2018-12-14 (×8): 100 mg via ORAL
  Filled 2018-12-10 (×8): qty 2

## 2018-12-10 MED ORDER — ASPIRIN 81 MG PO CHEW
324.0000 mg | CHEWABLE_TABLET | Freq: Once | ORAL | Status: AC
  Administered 2018-12-10: 324 mg via ORAL
  Filled 2018-12-10: qty 4

## 2018-12-10 MED ORDER — POLYETHYLENE GLYCOL 3350 17 G PO PACK: 17.0000 g | PACK | Freq: Every day | ORAL | Status: DC | PRN

## 2018-12-10 MED ORDER — ACETAMINOPHEN 325 MG PO TABS
650.0000 mg | ORAL_TABLET | Freq: Four times a day (QID) | ORAL | Status: DC | PRN
  Administered 2018-12-10 – 2018-12-14 (×12): 650 mg via ORAL
  Filled 2018-12-10 (×12): qty 2

## 2018-12-10 MED ORDER — ONDANSETRON HCL 4 MG/2ML IJ SOLN
4.0000 mg | Freq: Once | INTRAMUSCULAR | Status: AC
  Administered 2018-12-10: 4 mg via INTRAVENOUS
  Filled 2018-12-10: qty 2

## 2018-12-10 MED ORDER — AMIODARONE HCL 200 MG PO TABS
200.0000 mg | ORAL_TABLET | Freq: Every day | ORAL | Status: DC
  Administered 2018-12-11 – 2018-12-14 (×4): 200 mg via ORAL
  Filled 2018-12-10 (×4): qty 1

## 2018-12-10 MED ORDER — RIVAROXABAN 15 MG PO TABS
15.0000 mg | ORAL_TABLET | Freq: Every day | ORAL | Status: DC
  Administered 2018-12-11: 15 mg via ORAL
  Filled 2018-12-10 (×2): qty 1

## 2018-12-10 NOTE — ED Triage Notes (Signed)
PT to ED via EMS from home. PT had an unwitnessed fall today, while using his walker pt lost his balance and fell, unable to get up. PT did not lose consciousness or hit head but is on blood thinners. PT c/o pain all over, except for shoulders. Only obvious injury is skin tear to R arm.

## 2018-12-10 NOTE — ED Notes (Signed)
This RN notified Set designer of issue with L hand IV. Pt will need new IV. Still needs bloodwork sent. Paige RN aware.

## 2018-12-10 NOTE — ED Provider Notes (Addendum)
[facility] Emergency Department Provider Note   ____________________________________________   First MD Initiated Contact with Patient 12/10/18 1750     (approximate)  I have reviewed the triage vital signs and the nursing notes.   HISTORY  Chief Complaint Fall  `  HPI Daniel Prokop. is a 78 y.o. male who was walking in his house with his walker going to the bathroom he went around the corner tripped and fell and could not get up.  He usually cannot get up if he falls.  He laid on the floor in the hallway for 11 hours.  He was lying on his right side with his left leg bent back.  He complains of a lot of pain in his right shoulder and both hips and his knees.  He does not think he hit his head or passed out.  He could not get a hold of his phone or contact his family in any way.         Past Medical History:  Diagnosis Date   A-fib ([place])    Cellulitis    CHF (congestive heart failure) (HCC)    COPD (chronic obstructive pulmonary disease) ([place])    Difficult intubation    Hyperlipidemia    Hypertension    Hypothyroidism    Kidney disease    Sleep apnea    Stroke [facility])    Thyroid disease     Patient Active Problem List   Diagnosis Date Noted   Sepsis ([place]) 07/28/2018   Cough 03/20/2015   Bronchitis, chronic obstructive w acute bronchitis ([place]) [id number]   Umbilical hernia without obstruction and without gangrene 01/12/2015   Ventral hernia without obstruction or gangrene 01/02/2015    Past Surgical History:  Procedure Laterality Date   CARDIAC CATHETERIZATION     CATARACT EXTRACTION W/ INTRAOCULAR LENS  IMPLANT, BILATERAL     COLONOSCOPY  2008   HERNIA REPAIR     bilateral inguinal hernia/ [place]   HERNIA REPAIR  02/02/2015   18 x 28 cm ventral light mesh placed laparoscopically.   TONSILLECTOMY     UMBILICAL HERNIA REPAIR N/A 02/02/2015   Procedure: HERNIA REPAIR UMBILICAL ADULT;  Surgeon: Robert Bellow, MD;  Location: [facility] ORS;  Service: General;  Laterality: N/A;   VENTRAL HERNIA REPAIR N/A 02/02/2015   Procedure: LAPAROSCOPIC VENTRAL HERNIA;  Surgeon: Robert Bellow, MD;  Location: [facility] ORS;  Service: General;  Laterality: N/A;   vp shunt placement  1979    Prior to Admission medications   Medication Sig Start Date End Date Taking? Authorizing Provider  amiodarone (PACERONE) 200 MG tablet Take 200 mg by mouth daily.  02/20/14   [provider]  Fluticasone-Umeclidin-Vilant (TRELEGY ELLIPTA) 100-62.5-25 MCG/INH AEPB Inhale 1 puff into the lungs daily.    [provider]  levothyroxine (SYNTHROID) 88 MCG tablet Take 88 mcg by mouth daily.  02/20/14   [provider]  loratadine (CLARITIN) 10 MG tablet Take 10 mg by mouth daily.    [provider]  metoprolol (LOPRESSOR) 100 MG tablet Take 100 mg by mouth 2 (two) times daily.  02/20/14   [provider]  Multiple Vitamin (MULTI-VITAMINS) TABS Take 1 tablet by mouth daily.     [provider]  Omega-3 Fatty Acids (OMEGA 3 PO) Take 2 capsules by mouth daily.    [provider]  omeprazole (PRILOSEC) 40 MG capsule Take 40 mg by mouth daily.  02/20/14   [provider]  polyethylene glycol (MIRALAX / GLYCOLAX) 17 g packet Take 17 g by mouth daily as needed. 08/03/18   Stark Jock, Jude, MD  potassium chloride (MICRO-K) 10 MEQ CR capsule TAKE 2 CAPSULES (20 MEQ TOTAL) BY MOUTH 3 (THREE) TIMES DAILY 05/10/18   [provider]  rivaroxaban (XARELTO) 20 MG TABS tablet Take 1 tablet (20 mg total) by mouth daily. 08/03/18   Stark Jock Jude, MD  torsemide (DEMADEX) 20 MG tablet Take 1 tablet (20 mg total) by mouth daily. 08/03/18   Stark Jock Jude, MD    Allergies Diltiazem hcl  Family History  Problem Relation Age of Onset   Heart disease Mother    Alcohol abuse Father     Social History Social History   Tobacco Use   Smoking status: Former Smoker   Smokeless tobacco:  Never Used  Substance Use Topics   Alcohol use: No   Drug use: No    Review of Systems  Constitutional: No fever/chills Eyes: No visual changes. ENT: No sore throat. Cardiovascular: Denies chest pain. Respiratory: Denies shortness of breath. Gastrointestinal: No abdominal pain.  No nausea, no vomiting.  No diarrhea.  No constipation. Genitourinary: Negative for dysuria. Musculoskeletal: Negative for back pain. Skin: Negative for rash. Neurological: Negative for headaches, focal weakness  ____________________________________________   PHYSICAL EXAM:  VITAL SIGNS: ED Triage Vitals  Enc Vitals Group     BP 12/10/18 1803 137/75     Pulse Rate 12/10/18 1803 90     Resp 12/10/18 1803 20     Temp --      Temp src --      SpO2 --      Weight 12/10/18 1800 300 lb (136.1 kg)     Height 12/10/18 1800 5\' 11"  (1.803 m)     Head Circumference --      Peak Flow --      Pain Score --      Pain Loc --      Pain Edu? --      Excl. in [place]? --     Constitutional: Alert and oriented.  Complaining of a lot of pain Eyes: Conjunctivae are normal. PER. EOMI. Head: Atraumatic. Nose: No congestion/rhinnorhea. Mouth/Throat: Mucous membranes are moist.  Oropharynx non-erythematous. Neck: No stridor.  Cardiovascular: Normal rate, regular rhythm. Grossly normal heart sounds.  Good peripheral circulation. Respiratory: Normal respiratory effort.  No retractions. Lungs CTAB. Gastrointestinal: Soft and nontender. No distention. No abdominal bruits. No CVA tenderness. Musculoskeletal: Multiple bruises pain in the shoulder and both hips and knees. Neurologic:  Normal speech and language. No gross focal neurologic deficits are appreciated. No gait instability. Skin:  Skin is warm, dry and intact.  See musculoskeletal Psychiatric: Mood and affect are normal. Speech and behavior are normal.  ____________________________________________   LABS (all labs ordered are listed, but only abnormal  results are displayed)  Labs Reviewed  COMPREHENSIVE METABOLIC PANEL - Abnormal; Notable for the following components:      Result Value   Chloride 96 (*)    BUN 33 (*)    Creatinine, Ser 2.12 (*)    Albumin 3.3 (*)    AST 111 (*)    GFR calc non Af Amer 29 (*)    GFR calc Af Amer 34 (*)    All other components within normal limits  CBC WITH DIFFERENTIAL/PLATELET - Abnormal; Notable for the following components:   WBC 15.8 (*)    MCV 101.4 (*)    RDW 15.7 (*)    Neutro  Abs 14.6 (*)    Abs Immature Granulocytes 0.13 (*)    All other components within normal limits  CK - Abnormal; Notable for the following components:   Total CK 5,355 (*)    All other components within normal limits  TROPONIN I (HIGH SENSITIVITY) - Abnormal; Notable for the following components:   Troponin I (High Sensitivity) 130 (*)    All other components within normal limits  URINALYSIS, COMPLETE (UACMP) WITH MICROSCOPIC  URINE DRUG SCREEN, QUALITATIVE ([facility] ONLY)  TROPONIN I (HIGH SENSITIVITY)   ____________________________________________  EKG  EKG read interpreted by me shows sinus rhythm at 95 normal axis no acute ST-T changes there is a QTC of 532 ms ____________________________________________  RADIOLOGY  X-rays of the right shoulder read by radiology reviewed by me show DJD.  The shoulder is contacting the acromion.  This looks chronic.  Patient did not have any acute injury there is no fractures.  There is DJD per radiology and by my review of both hips and knees.  Nothing fractured or acutely abnormal and head or C-spine CT either per radiology my review.  Official radiology report(s): Dg Shoulder Right  Result Date: 12/10/2018 CLINICAL DATA:  Fall EXAM: RIGHT SHOULDER - 2+ VIEW COMPARISON:  None. FINDINGS: Nonstandard positioning. There is superior subluxation of the glenohumeral joint with the superior aspect of the humeral head likely contacting the inferior acromion. There is mild  acromioclavicular osteoarthrosis. No fracture. IMPRESSION: 1. Superior subluxation at the glenohumeral joint, likely contacting the inferior acromion, possibly chronic. 2. No acute fracture. Electronically Signed   By: Ulyses Jarred M.D.   On: 12/10/2018 19:12   Ct Head Wo Contrast  Result Date: 12/10/2018 CLINICAL DATA:  Fall, balance difficulty, head trauma, anticoagulated EXAM: CT HEAD WITHOUT CONTRAST CT CERVICAL SPINE WITHOUT CONTRAST TECHNIQUE: Multidetector CT imaging of the head and cervical spine was performed following the standard protocol without intravenous contrast. Multiplanar CT image reconstructions of the cervical spine were also generated. COMPARISON:  None. FINDINGS: CT HEAD FINDINGS Brain: Brain atrophy noted with chronic white matter microvascular ischemic changes throughout the cerebral hemispheres, worse on the right. No acute intracranial hemorrhage, mass lesion, infarction, midline shift, herniation or hydrocephalus. Extra-axial fluid collection. No focal mass effect or edema. Cisterns are patent. Cerebellar atrophy as well. Vascular: Intracranial atherosclerosis.  No hyperdense vessel. Skull: Limited with motion artifact. No definite acute osseous finding. Sinuses/Orbits: No significant finding by CT. Other: None. CT CERVICAL SPINE FINDINGS Alignment: Normal. Skull base and vertebrae: No acute fracture. No primary bone lesion or focal pathologic process. Soft tissues and spinal canal: No prevertebral fluid or swelling. No visible canal hematoma. Disc levels: Diffuse multilevel degenerative disc disease with disc space narrowing, sclerosis and endplate osteophytes. Preserved vertebral body heights. Facets are aligned. No subluxation or dislocation. Upper chest: Negative. Other: None. IMPRESSION: Brain atrophy and chronic white matter microvascular changes. No acute intracranial abnormality by noncontrast CT. Diffuse cervical degenerative change without acute osseous finding, definite  fracture or malalignment by CT. Electronically Signed   By: Jerilynn Mages.  Shick M.D.   On: 12/10/2018 19:19   Ct Cervical Spine Wo Contrast  Result Date: 12/10/2018 CLINICAL DATA:  Fall, balance difficulty, head trauma, anticoagulated EXAM: CT HEAD WITHOUT CONTRAST CT CERVICAL SPINE WITHOUT CONTRAST TECHNIQUE: Multidetector CT imaging of the head and cervical spine was performed following the standard protocol without intravenous contrast. Multiplanar CT image reconstructions of the cervical spine were also generated. COMPARISON:  None. FINDINGS: CT HEAD FINDINGS Brain: Brain atrophy noted  with chronic white matter microvascular ischemic changes throughout the cerebral hemispheres, worse on the right. No acute intracranial hemorrhage, mass lesion, infarction, midline shift, herniation or hydrocephalus. Extra-axial fluid collection. No focal mass effect or edema. Cisterns are patent. Cerebellar atrophy as well. Vascular: Intracranial atherosclerosis.  No hyperdense vessel. Skull: Limited with motion artifact. No definite acute osseous finding. Sinuses/Orbits: No significant finding by CT. Other: None. CT CERVICAL SPINE FINDINGS Alignment: Normal. Skull base and vertebrae: No acute fracture. No primary bone lesion or focal pathologic process. Soft tissues and spinal canal: No prevertebral fluid or swelling. No visible canal hematoma. Disc levels: Diffuse multilevel degenerative disc disease with disc space narrowing, sclerosis and endplate osteophytes. Preserved vertebral body heights. Facets are aligned. No subluxation or dislocation. Upper chest: Negative. Other: None. IMPRESSION: Brain atrophy and chronic white matter microvascular changes. No acute intracranial abnormality by noncontrast CT. Diffuse cervical degenerative change without acute osseous finding, definite fracture or malalignment by CT. Electronically Signed   By: Jerilynn Mages.  Shick M.D.   On: 12/10/2018 19:19   Dg Knee Complete 4 Views Left  Result Date:  12/10/2018 CLINICAL DATA:  Recent fall with left knee pain, initial encounter EXAM: LEFT KNEE - COMPLETE 4+ VIEW COMPARISON:  None. FINDINGS: Tricompartmental degenerative changes of the left knee are seen. No joint effusion is noted. No acute fracture or dislocation is seen. IMPRESSION: Degenerative changes without acute abnormality. Electronically Signed   By: Inez Catalina M.D.   On: 12/10/2018 19:11   Dg Knee Complete 4 Views Right  Result Date: 12/10/2018 CLINICAL DATA:  Recent fall with right knee pain, initial encounter EXAM: RIGHT KNEE - COMPLETE 4+ VIEW COMPARISON:  None. FINDINGS: Tricompartmental degenerative changes are noted. No acute fracture or dislocation is noted. No soft tissue abnormality is seen. IMPRESSION: Degenerative change without acute abnormality. Electronically Signed   By: Inez Catalina M.D.   On: 12/10/2018 19:13   Dg Hip Unilat W Or Wo Pelvis 2-3 Views Left  Result Date: 12/10/2018 CLINICAL DATA:  Recent fall with left hip pain, initial encounter EXAM: DG HIP (WITH OR WITHOUT PELVIS) 2-3V LEFT COMPARISON:  None. FINDINGS: Pelvic ring is intact. Mild degenerative changes of the hip joints are noted. No acute fracture or dislocation is noted. No soft tissue abnormality is seen IMPRESSION: No acute abnormality noted. Electronically Signed   By: Inez Catalina M.D.   On: 12/10/2018 19:10   Dg Hip Unilat W Or Wo Pelvis 2-3 Views Right  Result Date: 12/10/2018 CLINICAL DATA:  Recent fall with right hip pain, initial encounter EXAM: DG HIP (WITH OR WITHOUT PELVIS) 3V RIGHT COMPARISON:  None. FINDINGS: Pelvic ring is intact. Mild degenerative changes of the hip joints are noted. No acute fracture or dislocation is seen. No soft tissue abnormality is noted. IMPRESSION: No acute abnormality seen. Electronically Signed   By: Inez Catalina M.D.   On: 12/10/2018 19:10    ____________________________________________   PROCEDURES  Procedure(s) performed (including Critical  Care):  Procedures   ____________________________________________   INITIAL IMPRESSION / ASSESSMENT AND PLAN / ED COURSE Daniel Nan. was evaluated in Emergency Department on 12/10/2018 for the symptoms described in the history of present illness. He was evaluated in the context of the global COVID-19 pandemic, which necessitated consideration that the patient might be at risk for infection with the SARS-CoV-2 virus that causes COVID-19. Institutional protocols and algorithms that pertain to the evaluation of patients at risk for COVID-19 are in a state of rapid change based  on information released by regulatory bodies including the CDC and federal and state organizations. These policies and algorithms were followed during the patient's care in the ED.       Patient significant other has a list of meds that also include metolazone 2.5 mg 1 tab Monday and Friday as needed Spironolactone 25 mg 1 tablet twice a day Trilogy left 100 mcg and 6.25 mcg 1 puff daily He also takes equate allergy relief 10 mg over-the-counter equate multivitamins 1 tablet a day for IV to [place] on a liquid 1 ounce a day to review day adapted to which liquid 1 ounce daily regular pro [place] 95 to a day try vita omega-3 prime 2 a day and try vita super B12 1 a day   Clinical Course as of Dec 10 4  Mon Dec 10, 2018  2038 Cocaine Metabolite,Ur Hawkins: Marlynn Perking DETECTED [PM]    Clinical Course User Index [PM] Nena Polio, MD     ____________________________________________   FINAL CLINICAL IMPRESSION(S) / ED DIAGNOSES  Final diagnoses:  Non-traumatic rhabdomyolysis  Fall, initial encounter  Elevated troponin     ED Discharge Orders    None       Note:  This document was prepared using Dragon voice recognition software and may include unintentional dictation errors.    Nena Polio, MD 12/10/18 2036 Patient seen upstairs.  He is feeling better.  He has no signs of a compartment syndrome  pain is better there is no tenseness in the hips or shoulder.   Nena Polio, MD 12/11/18 [phone]

## 2018-12-10 NOTE — H&P (Signed)
[facility] [place] NAME: Daniel Eaton    MR#:  [id number]  DATE OF BIRTH:  1940-12-15  DATE OF ADMISSION:  12/10/2018  PRIMARY CARE PHYSICIAN: Daniel Ruths, MD   REQUESTING/REFERRING PHYSICIAN: Dr. Conni Eaton  CHIEF COMPLAINT:   Chief Complaint  Patient presents with   Fall    HISTORY OF PRESENT ILLNESS:  Daniel Eaton  is a 78 y.o. male with a known history of atrial fibrillation, chronic venous stasis, COPD, hypertension hyperlipidemia morbid obesity, CKD stage III, history of previous CVA, obstructive sleep apnea who presents to the hospital after a fall and was lying there for prolonged period of time and noted to have acute rhabdomyolysis.  Patient says he went to go use the bathroom at home and then fell wedged between the bathroom in the hallway.  Patient says he try to get himself up was feeling too weak, his wife was not home and he did not have his alarm bracelet on and could not get to his phone.  He lied there for about 5 hours until his wife came home from work and found him on the floor and sent him to the ER for further evaluation.  In the emergency room patient was noted to be in acute on chronic renal failure noted to have acute rhabdomyolysis and therefore hospitalist services were contacted for admission.  Patient denies any chest pains, shortness of breath, nausea, vomiting, palpitations, fever, chills, syncope or any other associated symptoms.  Patient's COVID-19 test is still pending.  PAST MEDICAL HISTORY:   Past Medical History:  Diagnosis Date   A-fib ([place])    Cellulitis    CHF (congestive heart failure) (HCC)    COPD (chronic obstructive pulmonary disease) ([place])    Difficult intubation    Hyperlipidemia    Hypertension    Hypothyroidism    Kidney disease    Sleep apnea    Stroke [facility])    Thyroid disease     PAST SURGICAL HISTORY:   Past Surgical History:  Procedure Laterality Date     CARDIAC CATHETERIZATION     CATARACT EXTRACTION W/ INTRAOCULAR LENS  IMPLANT, BILATERAL     COLONOSCOPY  2008   HERNIA REPAIR     bilateral inguinal hernia/ [facility]   HERNIA REPAIR  02/02/2015   18 x 28 cm ventral light mesh placed laparoscopically.   TONSILLECTOMY     UMBILICAL HERNIA REPAIR N/A 02/02/2015   Procedure: HERNIA REPAIR UMBILICAL ADULT;  Surgeon: Daniel Bellow, MD;  Location: [facility] ORS;  Service: General;  Laterality: N/A;   VENTRAL HERNIA REPAIR N/A 02/02/2015   Procedure: LAPAROSCOPIC VENTRAL HERNIA;  Surgeon: Daniel Bellow, MD;  Location: [facility] ORS;  Service: General;  Laterality: N/A;   vp shunt placement  1979    SOCIAL HISTORY:   Social History   Tobacco Use   Smoking status: Former Smoker   Smokeless tobacco: Never Used  Substance Use Topics   Alcohol use: No    FAMILY HISTORY:   Family History  Problem Relation Age of Onset   Heart disease Mother    Alcohol abuse Father     DRUG ALLERGIES:   Allergies  Allergen Reactions   Diltiazem Hcl Itching and Other (See Comments)    edema    REVIEW OF SYSTEMS:   Review of Systems  Constitutional: Negative for fever and weight loss.  HENT: Negative for congestion, nosebleeds and tinnitus.   Eyes: Negative for  blurred vision, double vision and redness.  Respiratory: Negative for cough, hemoptysis and shortness of breath.   Cardiovascular: Negative for chest pain, orthopnea, leg swelling and PND.  Gastrointestinal: Negative for abdominal pain, diarrhea, melena, nausea and vomiting.  Genitourinary: Negative for dysuria, hematuria and urgency.  Musculoskeletal: Positive for falls and joint pain (Hip pain).  Neurological: Positive for weakness (generalized). Negative for dizziness, tingling, sensory change, focal weakness, seizures and headaches.  Endo/Heme/Allergies: Negative for polydipsia. Does not bruise/bleed easily.  Psychiatric/Behavioral: Negative for depression and memory  loss. The patient is not nervous/anxious.     MEDICATIONS AT HOME:   Prior to Admission medications   Medication Sig Start Date End Date Taking? Authorizing Provider  amiodarone (PACERONE) 200 MG tablet Take 200 mg by mouth daily.  02/20/14  Yes [provider]  Fluticasone-Umeclidin-Vilant (TRELEGY ELLIPTA) 100-62.5-25 MCG/INH AEPB Inhale 1 puff into the lungs daily.   Yes [provider]  levothyroxine (SYNTHROID) 88 MCG tablet Take 88 mcg by mouth daily.  02/20/14  Yes [provider]  loratadine (CLARITIN) 10 MG tablet Take 10 mg by mouth daily.   Yes [provider]  metolazone (ZAROXOLYN) 2.5 MG tablet Take 2.5 mg by mouth 2 (two) times a week. Monday and Friday 10/29/18  Yes [provider]  metoprolol (LOPRESSOR) 100 MG tablet Take 100 mg by mouth 2 (two) times daily.  02/20/14  Yes [provider]  Multiple Vitamin (MULTI-VITAMINS) TABS Take 1 tablet by mouth daily.    Yes [provider]  Omega-3 Fatty Acids (OMEGA 3 PO) Take 2 capsules by mouth daily.   Yes [provider]  omeprazole (PRILOSEC) 40 MG capsule Take 40 mg by mouth daily.  02/20/14  Yes [provider]  potassium chloride (MICRO-K) 10 MEQ CR capsule TAKE 2 CAPSULES (20 MEQ TOTAL) BY MOUTH 3 (THREE) TIMES DAILY 05/10/18  Yes [provider]  torsemide (DEMADEX) 20 MG tablet Take 1 tablet (20 mg total) by mouth daily. 08/03/18  Yes Daniel Eaton, Jude, MD  XARELTO 15 MG TABS tablet Take 15 mg by mouth daily. 09/28/18  Yes [provider]  polyethylene glycol (MIRALAX / GLYCOLAX) 17 g packet Take 17 g by mouth daily as needed. 08/03/18   Daniel Eaton, Jude, MD      VITAL SIGNS:  Blood pressure 137/75, pulse 90, resp. rate 20, height 5\' 11"  (1.803 m), weight 136.1 kg.  PHYSICAL EXAMINATION:  Physical Exam  GENERAL:  78 y.o.-year-old obese patient lying in the bed in no acute distress.  EYES: Pupils equal, round, reactive to light and accommodation.  No scleral icterus. Extraocular muscles intact.  HEENT: Head atraumatic, normocephalic. Oropharynx and nasopharynx clear. No oropharyngeal erythema, moist oral mucosa  NECK:  Supple, no jugular venous distention. No thyroid enlargement, no tenderness.  LUNGS: Poor Resp. effort, no wheezing, rales, rhonchi. No use of accessory muscles of respiration.  CARDIOVASCULAR: S1, S2 RRR. No murmurs, rubs, gallops, clicks.  ABDOMEN: Soft, nontender, nondistended. Bowel sounds present. No organomegaly or mass.  EXTREMITIES: No pedal edema, cyanosis, or clubbing. + 2 pedal & radial pulses b/l.  Signs of chronic venous stasis b/l. Skin tears on Left upper Ext.  NEUROLOGIC: Cranial nerves II through XII are intact. No focal Motor or sensory deficits appreciated b/l. Globally weak.  PSYCHIATRIC: The patient is alert and oriented x 3.  SKIN: No obvious rash, lesion, or ulcer.   LABORATORY PANEL:   CBC Recent Labs  Lab 12/10/18 1856  WBC 15.8*  HGB 14.3  HCT 44.6  PLT 207   ------------------------------------------------------------------------------------------------------------------  Chemistries  Recent Labs  Lab 12/10/18 1856  NA 135  K 4.5  CL 96*  CO2 24  GLUCOSE 85  BUN 33*  CREATININE 2.12*  CALCIUM 9.4  AST 111*  ALT 40  ALKPHOS 69  BILITOT 1.1   ------------------------------------------------------------------------------------------------------------------  Cardiac Enzymes No results for input(s): TROPONINI in the last 168 hours. ------------------------------------------------------------------------------------------------------------------  RADIOLOGY:  Dg Shoulder Right  Result Date: 12/10/2018 CLINICAL DATA:  Fall EXAM: RIGHT SHOULDER - 2+ VIEW COMPARISON:  None. FINDINGS: Nonstandard positioning. There is superior subluxation of the glenohumeral joint with the superior aspect of the humeral head likely contacting the inferior acromion. There is mild  acromioclavicular osteoarthrosis. No fracture. IMPRESSION: 1. Superior subluxation at the glenohumeral joint, likely contacting the inferior acromion, possibly chronic. 2. No acute fracture. Electronically Signed   By: Ulyses Jarred M.D.   On: 12/10/2018 19:12   Ct Head Wo Contrast  Result Date: 12/10/2018 CLINICAL DATA:  Fall, balance difficulty, head trauma, anticoagulated EXAM: CT HEAD WITHOUT CONTRAST CT CERVICAL SPINE WITHOUT CONTRAST TECHNIQUE: Multidetector CT imaging of the head and cervical spine was performed following the standard protocol without intravenous contrast. Multiplanar CT image reconstructions of the cervical spine were also generated. COMPARISON:  None. FINDINGS: CT HEAD FINDINGS Brain: Brain atrophy noted with chronic white matter microvascular ischemic changes throughout the cerebral hemispheres, worse on the right. No acute intracranial hemorrhage, mass lesion, infarction, midline shift, herniation or hydrocephalus. Extra-axial fluid collection. No focal mass effect or edema. Cisterns are patent. Cerebellar atrophy as well. Vascular: Intracranial atherosclerosis.  No hyperdense vessel. Skull: Limited with motion artifact. No definite acute osseous finding. Sinuses/Orbits: No significant finding by CT. Other: None. CT CERVICAL SPINE FINDINGS Alignment: Normal. Skull base and vertebrae: No acute fracture. No primary bone lesion or focal pathologic process. Soft tissues and spinal canal: No prevertebral fluid or swelling. No visible canal hematoma. Disc levels: Diffuse multilevel degenerative disc disease with disc space narrowing, sclerosis and endplate osteophytes. Preserved vertebral body heights. Facets are aligned. No subluxation or dislocation. Upper chest: Negative. Other: None. IMPRESSION: Brain atrophy and chronic white matter microvascular changes. No acute intracranial abnormality by noncontrast CT. Diffuse cervical degenerative change without acute osseous finding, definite  fracture or malalignment by CT. Electronically Signed   By: Jerilynn Mages.  Shick M.D.   On: 12/10/2018 19:19   Ct Cervical Spine Wo Contrast  Result Date: 12/10/2018 CLINICAL DATA:  Fall, balance difficulty, head trauma, anticoagulated EXAM: CT HEAD WITHOUT CONTRAST CT CERVICAL SPINE WITHOUT CONTRAST TECHNIQUE: Multidetector CT imaging of the head and cervical spine was performed following the standard protocol without intravenous contrast. Multiplanar CT image reconstructions of the cervical spine were also generated. COMPARISON:  None. FINDINGS: CT HEAD FINDINGS Brain: Brain atrophy noted with chronic white matter microvascular ischemic changes throughout the cerebral hemispheres, worse on the right. No acute intracranial hemorrhage, mass lesion, infarction, midline shift, herniation or hydrocephalus. Extra-axial fluid collection. No focal mass effect or edema. Cisterns are patent. Cerebellar atrophy as well. Vascular: Intracranial atherosclerosis.  No hyperdense vessel. Skull: Limited with motion artifact. No definite acute osseous finding. Sinuses/Orbits: No significant finding by CT. Other: None. CT CERVICAL SPINE FINDINGS Alignment: Normal. Skull base and vertebrae: No acute fracture. No primary bone lesion or focal pathologic process. Soft tissues and spinal canal: No prevertebral fluid or swelling. No visible canal hematoma. Disc levels: Diffuse multilevel degenerative disc disease with disc space narrowing, sclerosis and endplate osteophytes. Preserved vertebral body heights.  Facets are aligned. No subluxation or dislocation. Upper chest: Negative. Other: None. IMPRESSION: Brain atrophy and chronic white matter microvascular changes. No acute intracranial abnormality by noncontrast CT. Diffuse cervical degenerative change without acute osseous finding, definite fracture or malalignment by CT. Electronically Signed   By: Jerilynn Mages.  Shick M.D.   On: 12/10/2018 19:19   Dg Knee Complete 4 Views Left  Result Date:  12/10/2018 CLINICAL DATA:  Recent fall with left knee pain, initial encounter EXAM: LEFT KNEE - COMPLETE 4+ VIEW COMPARISON:  None. FINDINGS: Tricompartmental degenerative changes of the left knee are seen. No joint effusion is noted. No acute fracture or dislocation is seen. IMPRESSION: Degenerative changes without acute abnormality. Electronically Signed   By: Inez Catalina M.D.   On: 12/10/2018 19:11   Dg Knee Complete 4 Views Right  Result Date: 12/10/2018 CLINICAL DATA:  Recent fall with right knee pain, initial encounter EXAM: RIGHT KNEE - COMPLETE 4+ VIEW COMPARISON:  None. FINDINGS: Tricompartmental degenerative changes are noted. No acute fracture or dislocation is noted. No soft tissue abnormality is seen. IMPRESSION: Degenerative change without acute abnormality. Electronically Signed   By: Inez Catalina M.D.   On: 12/10/2018 19:13   Dg Hip Unilat W Or Wo Pelvis 2-3 Views Left  Result Date: 12/10/2018 CLINICAL DATA:  Recent fall with left hip pain, initial encounter EXAM: DG HIP (WITH OR WITHOUT PELVIS) 2-3V LEFT COMPARISON:  None. FINDINGS: Pelvic ring is intact. Mild degenerative changes of the hip joints are noted. No acute fracture or dislocation is noted. No soft tissue abnormality is seen IMPRESSION: No acute abnormality noted. Electronically Signed   By: Inez Catalina M.D.   On: 12/10/2018 19:10   Dg Hip Unilat W Or Wo Pelvis 2-3 Views Right  Result Date: 12/10/2018 CLINICAL DATA:  Recent fall with right hip pain, initial encounter EXAM: DG HIP (WITH OR WITHOUT PELVIS) 3V RIGHT COMPARISON:  None. FINDINGS: Pelvic ring is intact. Mild degenerative changes of the hip joints are noted. No acute fracture or dislocation is seen. No soft tissue abnormality is noted. IMPRESSION: No acute abnormality seen. Electronically Signed   By: Inez Catalina M.D.   On: 12/10/2018 19:10     IMPRESSION AND PLAN:   78 y.o. male with a known history of atrial fibrillation, chronic venous stasis, COPD,  hypertension hyperlipidemia morbid obesity, CKD stage III, history of previous CVA, obstructive sleep apnea who presents to the hospital after a fall and was lying there for prolonged period of time and noted to have acute rhabdomyolysis.  1.  Acute rhabdomyolysis-secondary to patient being found on the floor for prolonged period of time. -We will hydrate the patient with IV fluids, follow serial CKs.  2.  Acute on chronic renal failure-patient's baseline creatinines around 1.4-1.5 and patient presents to the hospital with a creatinine of 2.2. -This is secondary to dehydration and concomitant use of diuretics. -We will gently hydrate the patient with IV fluids, follow BUN/creatinine urine output.  Renal dose meds, avoid nephrotoxins.  Hold patient's diuretics for now  3.  Generalized weakness/fall/patient fell at home and was not able to get himself up. -We will get physical therapy consult to assess mobility. -Patient says he refuses to go to [facility] as he has been there in the past and did not like it.  4.  Elevated troponin-secondary to supply demand ischemia from acute on chronic renal failure combined with rhabdomyolysis. -No evidence of acute cardiac issues.  5.  Essential hypertension-continue metoprolol.  6.  History of atrial fibrillation-rate controlled.  Continue Lopressor, amiodarone.  Continue Xarelto.  7.  GERD-continue Protonix.  8.  COPD-no acute exacerbation-continue patient's maintenance inhalers with Trelegy Ellipta.    All the records are reviewed and case discussed with ED provider. Management plans discussed with the patient, family and they are in agreement.  CODE STATUS: Full code  TOTAL TIME TAKING CARE OF THIS PATIENT: 40 minutes.    Henreitta Leber M.D on 12/10/2018 at 9:01 PM  Between 7am to 6pm - Pager - [phone]  After 6pm go to [email or web address] - password [email or web address] [facility]  [facility]  Office  [phone]  CC: Primary care  physician; Daniel Ruths, MD

## 2018-12-10 NOTE — ED Notes (Signed)
This RN placed 22g IV in L hand. Flushed well with blood return noted. When this RN attempted to pull bloodwork wouldn't pull back enough. When attempting to flush again, vein blew.

## 2018-12-11 LAB — CBC
HCT: 36.5 % — ABNORMAL LOW (ref 39.0–52.0)
Hemoglobin: 12 g/dL — ABNORMAL LOW (ref 13.0–17.0)
MCH: 33 pg (ref 26.0–34.0)
MCHC: 32.9 g/dL (ref 30.0–36.0)
MCV: 100.3 fL — ABNORMAL HIGH (ref 80.0–100.0)
Platelets: 177 10*3/uL (ref 150–400)
RBC: 3.64 MIL/uL — ABNORMAL LOW (ref 4.22–5.81)
RDW: 15.9 % — ABNORMAL HIGH (ref 11.5–15.5)
WBC: 11.9 10*3/uL — ABNORMAL HIGH (ref 4.0–10.5)
nRBC: 0 % (ref 0.0–0.2)

## 2018-12-11 LAB — CK: Total CK: 6651 U/L — ABNORMAL HIGH (ref 49–397)

## 2018-12-11 LAB — BASIC METABOLIC PANEL
Anion gap: 9 (ref 5–15)
BUN: 31 mg/dL — ABNORMAL HIGH (ref 8–23)
CO2: 25 mmol/L (ref 22–32)
Calcium: 8.4 mg/dL — ABNORMAL LOW (ref 8.9–10.3)
Chloride: 102 mmol/L (ref 98–111)
Creatinine, Ser: 1.67 mg/dL — ABNORMAL HIGH (ref 0.61–1.24)
GFR calc Af Amer: 45 mL/min — ABNORMAL LOW (ref >60)
GFR calc non Af Amer: 39 mL/min — ABNORMAL LOW (ref >60)
Glucose, Bld: 110 mg/dL — ABNORMAL HIGH (ref 70–99)
Potassium: 4.3 mmol/L (ref 3.5–5.1)
Sodium: 136 mmol/L (ref 135–145)

## 2018-12-11 LAB — SARS CORONAVIRUS 2 (TAT 6-24 HRS): SARS Coronavirus 2: NEGATIVE

## 2018-12-11 MED ORDER — FLUTICASONE FUROATE-VILANTEROL 100-25 MCG/INH IN AEPB
1.0000 | INHALATION_SPRAY | Freq: Every day | RESPIRATORY_TRACT | Status: DC
  Administered 2018-12-11 – 2018-12-14 (×4): 1 via RESPIRATORY_TRACT
  Filled 2018-12-11: qty 28

## 2018-12-11 MED ORDER — UMECLIDINIUM BROMIDE 62.5 MCG/INH IN AEPB
1.0000 | INHALATION_SPRAY | Freq: Every day | RESPIRATORY_TRACT | Status: DC
  Administered 2018-12-11 – 2018-12-14 (×4): 1 via RESPIRATORY_TRACT
  Filled 2018-12-11: qty 7

## 2018-12-11 NOTE — Consult Note (Addendum)
[facility] Nurse wound consult note Reason for Consult: Consult requested for several abrasions to arms and legs which occurred prior to admission.  [facility] team is working remotely today.  Reviewed progress notes in the EMR.   Dressing procedure/placement/frequency: Topical treatment orders provided for bedside nurse to perform as follows: Apply xerform gauze to abrasions on arms and legs Q day, and cover with foam dressings.  (Change foam dressings Q 3 days or PRN soiling.)  WOC team will assess the wound locations in-person tomorrow and revise the plan of care if indicated.  Julien Girt MSN, RN, [place], [place], [place]

## 2018-12-11 NOTE — Progress Notes (Addendum)
[facility] NAME: Zaine Zuhlke    MR#:  [id number]  DATE OF BIRTH:  1940/06/30  SUBJECTIVE:   Patient states he is feeling a little bit better today.  He is still having a lot of "muscle soreness" in his thighs and lower back.  He denies any joint pain, headaches, or vision changes.  REVIEW OF SYSTEMS:  Review of Systems  Constitutional: Negative for chills and fever.  HENT: Negative for congestion and sore throat.   Eyes: Negative for blurred vision and double vision.  Respiratory: Negative for cough and shortness of breath.   Cardiovascular: Negative for chest pain and palpitations.  Gastrointestinal: Negative for nausea and vomiting.  Genitourinary: Negative for dysuria and urgency.  Musculoskeletal: Positive for back pain, falls and myalgias. Negative for joint pain.  Neurological: Negative for dizziness and headaches.  Psychiatric/Behavioral: Negative for depression. The patient is not nervous/anxious.     DRUG ALLERGIES:   Allergies  Allergen Reactions  . Diltiazem Hcl Itching and Other (See Comments)    edema   VITALS:  Blood pressure (!) 101/50, pulse 82, temperature 98.3 F (36.8 C), temperature source Oral, resp. rate 20, height 5\' 11"  (1.803 m), weight 132.9 kg, SpO2 98 %. PHYSICAL EXAMINATION:  Physical Exam  GENERAL:  Laying in the bed with no acute distress.  HEENT: Head atraumatic, normocephalic. Pupils equal, round, reactive to light and accommodation. No scleral icterus. Extraocular muscles intact. Oropharynx and nasopharynx clear.  NECK:  Supple, no jugular venous distention. No thyroid enlargement. LUNGS: Lungs are clear to auscultation bilaterally. No wheezes, crackles, rhonchi. No use of accessory muscles of respiration.  CARDIOVASCULAR: RRR, S1, S2 normal. No murmurs, rubs, or gallops.  ABDOMEN: Soft, nontender, nondistended. Bowel sounds present.  EXTREMITIES: No pedal edema, cyanosis, or clubbing.  + Bilateral thighs are tender to palpation. NEUROLOGIC: CN 2-12 intact, no focal deficits. 5/5 muscle strength throughout all extremities. Sensation intact throughout. Gait not checked.  PSYCHIATRIC: The patient is alert and oriented x 3.  SKIN: No obvious rash, lesion, or ulcer. + Multiple abrasions present on the arms bilaterally. +chronic venous stasis changes in the lower extremities bilaterally. LABORATORY PANEL:  Male CBC Recent Labs  Lab 12/11/18 0611  WBC 11.9*  HGB 12.0*  HCT 36.5*  PLT 177   ------------------------------------------------------------------------------------------------------------------ Chemistries  Recent Labs  Lab 12/10/18 1856 12/11/18 0611  NA 135 136  K 4.5 4.3  CL 96* 102  CO2 24 25  GLUCOSE 85 110*  BUN 33* 31*  CREATININE 2.12* 1.67*  CALCIUM 9.4 8.4*  AST 111*  --   ALT 40  --   ALKPHOS 69  --   BILITOT 1.1  --    RADIOLOGY:  Dg Shoulder Right  Result Date: 12/10/2018 CLINICAL DATA:  Fall EXAM: RIGHT SHOULDER - 2+ VIEW COMPARISON:  None. FINDINGS: Nonstandard positioning. There is superior subluxation of the glenohumeral joint with the superior aspect of the humeral head likely contacting the inferior acromion. There is mild acromioclavicular osteoarthrosis. No fracture. IMPRESSION: 1. Superior subluxation at the glenohumeral joint, likely contacting the inferior acromion, possibly chronic. 2. No acute fracture. Electronically Signed   By: Ulyses Jarred M.D.   On: 12/10/2018 19:12   Ct Head Wo Contrast  Result Date: 12/10/2018 CLINICAL DATA:  Fall, balance difficulty, head trauma, anticoagulated EXAM: CT HEAD WITHOUT CONTRAST CT CERVICAL SPINE WITHOUT CONTRAST TECHNIQUE: Multidetector CT imaging of the head and cervical spine was performed following the standard  protocol without intravenous contrast. Multiplanar CT image reconstructions of the cervical spine were also generated. COMPARISON:  None. FINDINGS: CT HEAD FINDINGS Brain: Brain  atrophy noted with chronic white matter microvascular ischemic changes throughout the cerebral hemispheres, worse on the right. No acute intracranial hemorrhage, mass lesion, infarction, midline shift, herniation or hydrocephalus. Extra-axial fluid collection. No focal mass effect or edema. Cisterns are patent. Cerebellar atrophy as well. Vascular: Intracranial atherosclerosis.  No hyperdense vessel. Skull: Limited with motion artifact. No definite acute osseous finding. Sinuses/Orbits: No significant finding by CT. Other: None. CT CERVICAL SPINE FINDINGS Alignment: Normal. Skull base and vertebrae: No acute fracture. No primary bone lesion or focal pathologic process. Soft tissues and spinal canal: No prevertebral fluid or swelling. No visible canal hematoma. Disc levels: Diffuse multilevel degenerative disc disease with disc space narrowing, sclerosis and endplate osteophytes. Preserved vertebral body heights. Facets are aligned. No subluxation or dislocation. Upper chest: Negative. Other: None. IMPRESSION: Brain atrophy and chronic white matter microvascular changes. No acute intracranial abnormality by noncontrast CT. Diffuse cervical degenerative change without acute osseous finding, definite fracture or malalignment by CT. Electronically Signed   By: Jerilynn Mages.  Shick M.D.   On: 12/10/2018 19:19   Ct Cervical Spine Wo Contrast  Result Date: 12/10/2018 CLINICAL DATA:  Fall, balance difficulty, head trauma, anticoagulated EXAM: CT HEAD WITHOUT CONTRAST CT CERVICAL SPINE WITHOUT CONTRAST TECHNIQUE: Multidetector CT imaging of the head and cervical spine was performed following the standard protocol without intravenous contrast. Multiplanar CT image reconstructions of the cervical spine were also generated. COMPARISON:  None. FINDINGS: CT HEAD FINDINGS Brain: Brain atrophy noted with chronic white matter microvascular ischemic changes throughout the cerebral hemispheres, worse on the right. No acute intracranial  hemorrhage, mass lesion, infarction, midline shift, herniation or hydrocephalus. Extra-axial fluid collection. No focal mass effect or edema. Cisterns are patent. Cerebellar atrophy as well. Vascular: Intracranial atherosclerosis.  No hyperdense vessel. Skull: Limited with motion artifact. No definite acute osseous finding. Sinuses/Orbits: No significant finding by CT. Other: None. CT CERVICAL SPINE FINDINGS Alignment: Normal. Skull base and vertebrae: No acute fracture. No primary bone lesion or focal pathologic process. Soft tissues and spinal canal: No prevertebral fluid or swelling. No visible canal hematoma. Disc levels: Diffuse multilevel degenerative disc disease with disc space narrowing, sclerosis and endplate osteophytes. Preserved vertebral body heights. Facets are aligned. No subluxation or dislocation. Upper chest: Negative. Other: None. IMPRESSION: Brain atrophy and chronic white matter microvascular changes. No acute intracranial abnormality by noncontrast CT. Diffuse cervical degenerative change without acute osseous finding, definite fracture or malalignment by CT. Electronically Signed   By: Jerilynn Mages.  Shick M.D.   On: 12/10/2018 19:19   Dg Knee Complete 4 Views Left  Result Date: 12/10/2018 CLINICAL DATA:  Recent fall with left knee pain, initial encounter EXAM: LEFT KNEE - COMPLETE 4+ VIEW COMPARISON:  None. FINDINGS: Tricompartmental degenerative changes of the left knee are seen. No joint effusion is noted. No acute fracture or dislocation is seen. IMPRESSION: Degenerative changes without acute abnormality. Electronically Signed   By: Inez Catalina M.D.   On: 12/10/2018 19:11   Dg Knee Complete 4 Views Right  Result Date: 12/10/2018 CLINICAL DATA:  Recent fall with right knee pain, initial encounter EXAM: RIGHT KNEE - COMPLETE 4+ VIEW COMPARISON:  None. FINDINGS: Tricompartmental degenerative changes are noted. No acute fracture or dislocation is noted. No soft tissue abnormality is seen.  IMPRESSION: Degenerative change without acute abnormality. Electronically Signed   By: Linus Mako.D.  On: 12/10/2018 19:13   Dg Hip Unilat W Or Wo Pelvis 2-3 Views Left  Result Date: 12/10/2018 CLINICAL DATA:  Recent fall with left hip pain, initial encounter EXAM: DG HIP (WITH OR WITHOUT PELVIS) 2-3V LEFT COMPARISON:  None. FINDINGS: Pelvic ring is intact. Mild degenerative changes of the hip joints are noted. No acute fracture or dislocation is noted. No soft tissue abnormality is seen IMPRESSION: No acute abnormality noted. Electronically Signed   By: Inez Catalina M.D.   On: 12/10/2018 19:10   Dg Hip Unilat W Or Wo Pelvis 2-3 Views Right  Result Date: 12/10/2018 CLINICAL DATA:  Recent fall with right hip pain, initial encounter EXAM: DG HIP (WITH OR WITHOUT PELVIS) 3V RIGHT COMPARISON:  None. FINDINGS: Pelvic ring is intact. Mild degenerative changes of the hip joints are noted. No acute fracture or dislocation is seen. No soft tissue abnormality is noted. IMPRESSION: No acute abnormality seen. Electronically Signed   By: Inez Catalina M.D.   On: 12/10/2018 19:10   ASSESSMENT AND PLAN:   Acute rhabdomyolysis-due to patient being on the floor for prolonged period of time.  CK has increased from yesterday to today. -Continue IV fluids -Recheck CK in the morning  Acute on chronic renal failure-due to above.  Creatinine has improved from yesterday to today. -Avoid nephrotoxic agents -Holding home torsemide and metolazone -Continue IV fluids  Generalized weakness/fall/patient fell at home and was not able to get himself up. -PT eval pending  Leukocytosis-likely reactive due to above.  No signs of infection.  Patient has been afebrile -Recheck CBC in the morning  Elevated troponin-secondary to demand ischemia with acute renal failure. -Monitor  Essential hypertension-BP well controlled -Continue home metoprolol  Paroxysmal atrial fibrillation-in normal sinus rhythm here  -Continue home metoprolol, amiodarone, and Xarelto  GERD-stable -Continue home Protonix  COPD-stable, no signs of acute exacerbation -Continue home inhalers  Wife, Fraser Din, updated via the phone.   All the records are reviewed and case discussed with Care Management/Social Worker. Management plans discussed with the patient, family and they are in agreement.  CODE STATUS: Full Code  TOTAL TIME TAKING CARE OF THIS PATIENT: 40 minutes.   More than 50% of the time was spent in counseling/coordination of care: YES  POSSIBLE D/C IN 1-2 DAYS, DEPENDING ON CLINICAL CONDITION.   Berna Spare  M.D on 12/11/2018 at 2:16 PM  Between 7am to 6pm - Pager [phone]  After 6pm go to [email or web address] - Technical brewer [facility]  Office  [phone]  CC: Primary care physician; [name], MD  Note: This dictation was prepared with Dragon dictation along with smaller phrase technology. Any transcriptional errors that result from this process are unintentional.

## 2018-12-12 LAB — BASIC METABOLIC PANEL
Anion gap: 6 (ref 5–15)
BUN: 27 mg/dL — ABNORMAL HIGH (ref 8–23)
CO2: 24 mmol/L (ref 22–32)
Calcium: 8.1 mg/dL — ABNORMAL LOW (ref 8.9–10.3)
Chloride: 107 mmol/L (ref 98–111)
Creatinine, Ser: 1.38 mg/dL — ABNORMAL HIGH (ref 0.61–1.24)
GFR calc Af Amer: 56 mL/min — ABNORMAL LOW (ref >60)
GFR calc non Af Amer: 49 mL/min — ABNORMAL LOW (ref >60)
Glucose, Bld: 92 mg/dL (ref 70–99)
Potassium: 3.8 mmol/L (ref 3.5–5.1)
Sodium: 137 mmol/L (ref 135–145)

## 2018-12-12 LAB — CBC
HCT: 34.2 % — ABNORMAL LOW (ref 39.0–52.0)
Hemoglobin: 11.1 g/dL — ABNORMAL LOW (ref 13.0–17.0)
MCH: 32.6 pg (ref 26.0–34.0)
MCHC: 32.5 g/dL (ref 30.0–36.0)
MCV: 100.6 fL — ABNORMAL HIGH (ref 80.0–100.0)
Platelets: 170 10*3/uL (ref 150–400)
RBC: 3.4 MIL/uL — ABNORMAL LOW (ref 4.22–5.81)
RDW: 15.2 % (ref 11.5–15.5)
WBC: 9.8 10*3/uL (ref 4.0–10.5)
nRBC: 0 % (ref 0.0–0.2)

## 2018-12-12 LAB — CK: Total CK: 2580 U/L — ABNORMAL HIGH (ref 49–397)

## 2018-12-12 MED ORDER — RIVAROXABAN 20 MG PO TABS
20.0000 mg | ORAL_TABLET | Freq: Every day | ORAL | Status: DC
  Administered 2018-12-12 – 2018-12-14 (×3): 20 mg via ORAL
  Filled 2018-12-12 (×3): qty 1

## 2018-12-12 MED ORDER — HYDROCERIN EX CREA
TOPICAL_CREAM | Freq: Every day | CUTANEOUS | Status: DC
  Administered 2018-12-12: 11:00:00 via TOPICAL
  Administered 2018-12-13: 1 via TOPICAL
  Administered 2018-12-14: 10:00:00 via TOPICAL
  Filled 2018-12-12: qty 113

## 2018-12-12 NOTE — NC FL2 (Signed)
[place] LEVEL OF CARE SCREENING TOOL     IDENTIFICATION  Patient Name: Daniel Eaton. Birthdate: September 24, 1940 Sex: male Admission Date (Current Location): 12/10/2018  [facility] and [place] Number:  Engineering geologist and Address:         Provider Number: [id number]  Attending Physician Name and Address:  Mayo, Pete Pelt, MD  Relative Name and Phone Number:       Current Level of Care: Hospital Recommended Level of Care: [place] Prior Approval Number:    Date Approved/Denied:   PASRR Number: [id number]  Discharge Plan: SNF    Current Diagnoses: Patient Active Problem List   Diagnosis Date Noted  . Rhabdomyolysis 12/10/2018  . Sepsis ([place]) 07/28/2018  . Cough 03/20/2015  . Bronchitis, chronic obstructive w acute bronchitis ([place]) 03/20/2015  . Umbilical hernia without obstruction and without gangrene 01/12/2015  . Ventral hernia without obstruction or gangrene 01/02/2015    Orientation RESPIRATION BLADDER Height & Weight     Self, Time, Situation  Normal Incontinent, External catheter Weight: 132.9 kg Height:  5\' 11"  (180.3 cm)  BEHAVIORAL SYMPTOMS/MOOD NEUROLOGICAL BOWEL NUTRITION STATUS      Continent Diet(Heart Healthy)  AMBULATORY STATUS COMMUNICATION OF NEEDS Skin   Extensive Assist Verbally PU Stage and Appropriate Care, Skin abrasions, Bruising                       Personal Care Assistance Level of Assistance  Bathing, Dressing Bathing Assistance: Maximum assistance   Dressing Assistance: Maximum assistance     Functional Limitations Info             SPECIAL CARE FACTORS FREQUENCY  PT (By licensed PT), OT (By licensed OT)                    Contractures      Additional Factors Info  Code Status, Allergies Code Status Info: FULL Allergies Info: Diltiazem           Current Medications (12/12/2018):  This is the current hospital active medication list Current Facility-Administered  Medications  Medication Dose Route Frequency Provider Last Rate Last Dose  . acetaminophen (TYLENOL) tablet 650 mg  650 mg Oral Q6H PRN Henreitta Leber, MD   650 mg at 12/12/18 1313   Or  . acetaminophen (TYLENOL) suppository 650 mg  650 mg Rectal Q6H PRN Henreitta Leber, MD      . amiodarone (PACERONE) tablet 200 mg  200 mg Oral Daily Henreitta Leber, MD   200 mg at 12/12/18 1104  . fluticasone furoate-vilanterol (BREO ELLIPTA) 100-25 MCG/INH 1 puff  1 puff Inhalation Daily Hall, Scott A, RPH   1 puff at 12/12/18 1105   And  . umeclidinium bromide (INCRUSE ELLIPTA) 62.5 MCG/INH 1 puff  1 puff Inhalation Daily Hart Robinsons A, RPH   1 puff at 12/12/18 1105  . hydrocerin (EUCERIN) cream   Topical Daily Mayo, Pete Pelt, MD      . levothyroxine (SYNTHROID) tablet 88 mcg  88 mcg Oral Daily Henreitta Leber, MD   88 mcg at 12/12/18 0519  . loratadine (CLARITIN) tablet 10 mg  10 mg Oral Daily Henreitta Leber, MD   10 mg at 12/12/18 1104  . metoprolol tartrate (LOPRESSOR) tablet 100 mg  100 mg Oral BID Henreitta Leber, MD   100 mg at 12/12/18 1104  . multivitamin with minerals tablet 1 tablet  1 tablet Oral Daily  Henreitta Leber, MD   1 tablet at 12/12/18 1104  . ondansetron (ZOFRAN) tablet 4 mg  4 mg Oral Q6H PRN Henreitta Leber, MD       Or  . ondansetron (ZOFRAN) injection 4 mg  4 mg Intravenous Q6H PRN Henreitta Leber, MD      . pantoprazole (PROTONIX) EC tablet 40 mg  40 mg Oral Daily Henreitta Leber, MD   40 mg at 12/12/18 1104  . polyethylene glycol (MIRALAX / GLYCOLAX) packet 17 g  17 g Oral Daily PRN Henreitta Leber, MD      . rivaroxaban (XARELTO) tablet 20 mg  20 mg Oral Q supper Mayo, Pete Pelt, MD         Discharge Medications: Please see discharge summary for a list of discharge medications.  Relevant Imaging Results:  Relevant Lab Results:   Additional Information [id number]  [name], RN

## 2018-12-12 NOTE — TOC Initial Note (Signed)
Transition of Care (TOC) - Initial/Assessment Note    Patient Details  Name: Daniel Eaton. MRN: [id number] Date of Birth: 09/18/1940  [facility]) CM/SW Contact:    [name], RN Phone Number: 12/12/2018, 4:33 PM  Clinical Narrative:                  PT has assessed patient and recommends SNF.  Reported wife is wanting to pursue placement.   Existing PASRR Fl2 sent for signature Bedsearch initiated  Full assessment to follow       Patient Goals and CMS Choice        Expected Discharge Plan and Services           Expected Discharge Date: 12/12/18                                    Prior Living Arrangements/Services                       Activities of Daily Living Home Assistive Devices/Equipment: Gilford Rile (specify type) ADL Screening (condition at time of admission) Patient's cognitive ability adequate to safely complete daily activities?: Yes Is the patient deaf or have difficulty hearing?: Yes Does the patient have difficulty seeing, even when wearing glasses/contacts?: No Does the patient have difficulty concentrating, remembering, or making decisions?: No Patient able to express need for assistance with ADLs?: Yes Does the patient have difficulty dressing or bathing?: Yes Independently performs ADLs?: No Communication: Independent Dressing (OT): Needs assistance Is this a change from baseline?: Change from baseline, expected to last >3 days Grooming: Needs assistance Is this a change from baseline?: Change from baseline, expected to last >3 days Feeding: Independent Bathing: Dependent Is this a change from baseline?: Change from baseline, expected to last >3 days Toileting: Dependent Is this a change from baseline?: Change from baseline, expected to last >3days In/Out Bed: Needs assistance Is this a change from baseline?: Change from baseline, expected to last >3 days Walks in Home: Needs assistance Is this a  change from baseline?: Change from baseline, expected to last >3 days Does the patient have difficulty walking or climbing stairs?: Yes Weakness of Legs: Both Weakness of Arms/Hands: Both  Permission Sought/Granted                  Emotional Assessment              Admission diagnosis:  Elevated troponin [R77.8] Fall, initial encounter [id number]] Non-traumatic rhabdomyolysis [M62.82] Patient Active Problem List   Diagnosis Date Noted  . Rhabdomyolysis 12/10/2018  . Sepsis ([place]) 07/28/2018  . Cough 03/20/2015  . Bronchitis, chronic obstructive w acute bronchitis ([place]) 03/20/2015  . Umbilical hernia without obstruction and without gangrene 01/12/2015  . Ventral hernia without obstruction or gangrene 01/02/2015   PCP:  [name], MD Pharmacy:   [id number] - [place], [address] [place] [address] Phone: [phone] Fax: [phone]     Social Determinants of Health (SDOH) Interventions    Readmission Risk Interventions No flowsheet data found.

## 2018-12-12 NOTE — Evaluation (Signed)
Physical Therapy Evaluation Patient Details Name: Daniel Eaton. MRN: [id number] DOB: 08/17/1940 Today's Date: 12/12/2018   History of Present Illness  Pt is admitted for rhabdomyolysis with history of fall at home where he laid in floor for prolonged period of time. Troponin elevated, however attributed to demand ischemia. History includes Afib, COPD, HTN, obesity, and CVA.   Clinical Impression  Pt is a pleasant 78 year old male who was admitted for rhabdomyolysis and is currently experiencing severe pain whenever he moves. Pt performs bed mobility with max assist to attempt sitting at EOB, unable to achieve secondary to pain. Unable to perform further mobility attempts. L side exhibits chronic weakness, however appears weaker than baseline per patient. Pt demonstrates deficits with strength/pain/mobility. Pt is currently not at baseline level. Would benefit from skilled PT to address above deficits and promote optimal return to PLOF; recommend transition to STR upon discharge from acute hospitalization.     Follow Up Recommendations SNF(requesting to not return to [facility])    Equipment Recommendations  None recommended by PT    Recommendations for Other Services       Precautions / Restrictions Precautions Precautions: Fall Restrictions Weight Bearing Restrictions: No      Mobility  Bed Mobility Overal bed mobility: Needs Assistance Bed Mobility: Supine to Sit     Supine to sit: Max assist     General bed mobility comments: attempted to sit on EOB, pt very painful with any assist. Unable to achieve upright position, returned to bed and readjusted. Pt unable to adjust self in bed and needed assist with shoulders and B LEs.  Transfers                 General transfer comment: unable to perform  Ambulation/Gait             General Gait Details: unable to perform  Stairs            Wheelchair Mobility    Modified Rankin (Stroke Patients Only)       Balance Overall balance assessment: Needs assistance;History of Falls     Sitting balance - Comments: unable to achieve seated position to measure balance                                     Pertinent Vitals/Pain Pain Assessment: Faces Pain Score: 10-Worst pain ever Pain Location: "all over" Pain Descriptors / Indicators: Aching;Discomfort;Dull;Grimacing    Home Living Family/patient expects to be discharged to:: Private residence Living Arrangements: Spouse/significant other Available Help at Discharge: Family Type of Home: House Home Access: Stairs to enter Entrance Stairs-Rails: Psychiatric nurse of Steps: 4 Home Layout: One level Home Equipment: Environmental consultant - 4 wheels;Cane - single point;Bedside commode(walk in shower )      Prior Function Level of Independence: Independent with assistive device(s)         Comments: is currently using RW for mobility. Reports 1 other fall recently resulting in 4 week SNF stay. Since then has been going to OP receiving PT and was independent with AD around home. Prior to Covid, was Optometrist        Extremity/Trunk Assessment   Upper Extremity Assessment Upper Extremity Assessment: Generalized weakness(B UE grossly 3+/5 grip; R shoulder 2/5 and painful)    Lower Extremity Assessment Lower Extremity Assessment: Generalized weakness(L LE grossly 2/5; R LE grossly 3/5  all painful)       Communication   Communication: No difficulties  Cognition Arousal/Alertness: Awake/alert Behavior During Therapy: WFL for tasks assessed/performed Overall Cognitive Status: Within Functional Limits for tasks assessed                                        General Comments      Exercises     Assessment/Plan    PT Assessment Patient needs continued PT services  PT Problem List         PT Treatment Interventions Gait training;Therapeutic activities;Therapeutic  exercise;Balance training    PT Goals (Current goals can be found in the Care Plan section)  Acute Rehab PT Goals Patient Stated Goal: to be able to walk to the bathroom PT Goal Formulation: With patient Time For Goal Achievement: 12/26/18 Potential to Achieve Goals: Good    Frequency Min 2X/week   Barriers to discharge        Co-evaluation               AM-PAC PT "6 Clicks" Mobility  Outcome Measure Help needed turning from your back to your side while in a flat bed without using bedrails?: Total Help needed moving from lying on your back to sitting on the side of a flat bed without using bedrails?: Total Help needed moving to and from a bed to a chair (including a wheelchair)?: Total Help needed standing up from a chair using your arms (e.g., wheelchair or bedside chair)?: Total Help needed to walk in hospital room?: Total Help needed climbing 3-5 steps with a railing? : Total 6 Click Score: 6    End of Session   Activity Tolerance: Patient limited by pain Patient left: in bed;with bed alarm set Nurse Communication: Mobility status PT Visit Diagnosis: Unsteadiness on feet (R26.81);Muscle weakness (generalized) (M62.81);History of falling (Z91.81);Difficulty in walking, not elsewhere classified (R26.2);Pain Pain - Right/Left: Right Pain - part of body: Shoulder;Hip;Knee;Leg    Time: [id number] PT Time Calculation (min) (ACUTE ONLY): 22 min   Charges:   PT Evaluation $PT Eval Low Complexity: 1 Low          Greggory Stallion, PT, DPT [phone]   Daniel Eaton 12/12/2018, 2:39 PM

## 2018-12-12 NOTE — Progress Notes (Signed)
[facility] [place] NAME: Ben Devany    MR#:  [id number]  DATE OF BIRTH:  [birth year]  SUBJECTIVE:   Patient states he is feeling a little bit better today.  He is still having a lot of "muscle soreness" in his thighs and lower back.  He denies any joint pain, headaches, or vision changes.  REVIEW OF SYSTEMS:  Review of Systems  Constitutional: Negative for chills and fever.  HENT: Negative for congestion and sore throat.   Eyes: Negative for blurred vision and double vision.  Respiratory: Negative for cough and shortness of breath.   Cardiovascular: Negative for chest pain and palpitations.  Gastrointestinal: Negative for nausea and vomiting.  Genitourinary: Negative for dysuria and urgency.  Musculoskeletal: Positive for back pain, falls and myalgias. Negative for joint pain.  Neurological: Negative for dizziness and headaches.  Psychiatric/Behavioral: Negative for depression. The patient is not nervous/anxious.     DRUG ALLERGIES:   Allergies  Allergen Reactions  . Diltiazem Hcl Itching and Other (See Comments)    edema   VITALS:  Blood pressure 111/63, pulse 71, temperature 98.4 F (36.9 C), temperature source Oral, resp. rate 20, height 5\' 11"  (1.803 m), weight 132.9 kg, SpO2 98 %. PHYSICAL EXAMINATION:  Physical Exam  GENERAL:  Laying in the bed with no acute distress.  HEENT: Head atraumatic, normocephalic. Pupils equal, round, reactive to light and accommodation. No scleral icterus. Extraocular muscles intact. Oropharynx and nasopharynx clear.  NECK:  Supple, no jugular venous distention. No thyroid enlargement. LUNGS: Lungs are clear to auscultation bilaterally. No wheezes, crackles, rhonchi. No use of accessory muscles of respiration.  CARDIOVASCULAR: RRR, S1, S2 normal. No murmurs, rubs, or gallops.  ABDOMEN: Soft, nontender, nondistended. Bowel sounds present.  EXTREMITIES: No pedal edema, cyanosis, or clubbing. +  Bilateral thighs are tender to palpation. NEUROLOGIC: CN 2-12 intact, no focal deficits. 5/5 muscle strength throughout all extremities. Sensation intact throughout. Gait not checked.  PSYCHIATRIC: The patient is alert and oriented x 3.  SKIN: No obvious rash, lesion, or ulcer. + Multiple abrasions present on the arms bilaterally. +chronic venous stasis changes in the lower extremities bilaterally. LABORATORY PANEL:  Male CBC Recent Labs  Lab 12/12/18 0414  WBC 9.8  HGB 11.1*  HCT 34.2*  PLT 170   ------------------------------------------------------------------------------------------------------------------ Chemistries  Recent Labs  Lab 12/10/18 1856  12/12/18 0414  NA 135   < > 137  K 4.5   < > 3.8  CL 96*   < > 107  CO2 24   < > 24  GLUCOSE 85   < > 92  BUN 33*   < > 27*  CREATININE 2.12*   < > 1.38*  CALCIUM 9.4   < > 8.1*  AST 111*  --   --   ALT 40  --   --   ALKPHOS 69  --   --   BILITOT 1.1  --   --    < > = values in this interval not displayed.   RADIOLOGY:  No results found. ASSESSMENT AND PLAN:   Acute rhabdomyolysis-due to patient being on the floor for prolonged period of time.  CK has improved. -Stop IVFs  Acute on chronic renal failure-due to above.  Creatinine continues to improve. -Avoid nephrotoxic agents -Holding home torsemide and metolazone  Generalized weakness/fall/patient fell at home and was not able to get himself up. -PT eval pending  Elevated troponin-secondary to demand ischemia with acute  renal failure. -Monitor  Essential hypertension-BP well controlled -Continue home metoprolol  Paroxysmal atrial fibrillation-in normal sinus rhythm here -Continue home metoprolol, amiodarone, and Xarelto  GERD-stable -Continue home Protonix  COPD-stable, no signs of acute exacerbation -Continue home inhalers  Wife, Fraser Din, updated via the phone.   All the records are reviewed and case discussed with Care Management/Social Worker.  Management plans discussed with the patient, family and they are in agreement.  CODE STATUS: Full Code  TOTAL TIME TAKING CARE OF THIS PATIENT: 38 minutes.   More than 50% of the time was spent in counseling/coordination of care: YES  POSSIBLE D/C IN 1-2 DAYS, DEPENDING ON CLINICAL CONDITION.   Berna Spare Mayo M.D on 12/12/2018 at 12:38 PM  Between 7am to 6pm - Pager - [phone]  After 6pm go to [email or web address] - Technical brewer [facility]  Office  [phone]  CC: Primary care physician; [name], MD  Note: This dictation was prepared with Dragon dictation along with smaller phrase technology. Any transcriptional errors that result from this process are unintentional.

## 2018-12-12 NOTE — Consult Note (Addendum)
[facility] Nurse wound follow up Refer to progress note from 10/27; in person assessment performed today.  There is a partial thickness abrasion to right wrist; 2X2X.1 cm, red and moist Right elbow with full thickness abrasion; 18X8X.2cm, red and moist with skin flap in place, small amt pink drainage Bilat legs dark purple-red, some edema, with dry scaley skin and scattered bruises; appearance consistent with venous stasis changes. Dressing procedure/placement/frequency: Topical treatment orders provided for bedside nurse to perform as follows: Apply xerform gauze to abrasions on right arm Q day, and cover with foam dressings.  (Change foam dressings Q 3 days or PRN soiling.) Apply Eucerin cream to bilat legs Q day and leave open to air. Please re-consult if further assistance is needed.  Thank-you,  Julien Girt MSN, [place], [place], [place], [place]

## 2018-12-13 LAB — BASIC METABOLIC PANEL
Anion gap: 6 (ref 5–15)
BUN: 19 mg/dL (ref 8–23)
CO2: 24 mmol/L (ref 22–32)
Calcium: 8.2 mg/dL — ABNORMAL LOW (ref 8.9–10.3)
Chloride: 106 mmol/L (ref 98–111)
Creatinine, Ser: 1.17 mg/dL (ref 0.61–1.24)
GFR calc Af Amer: >60 mL/min (ref >60)
GFR calc non Af Amer: 59 mL/min — ABNORMAL LOW (ref >60)
Glucose, Bld: 86 mg/dL (ref 70–99)
Potassium: 4.2 mmol/L (ref 3.5–5.1)
Sodium: 136 mmol/L (ref 135–145)

## 2018-12-13 MED ORDER — TORSEMIDE 20 MG PO TABS
20.0000 mg | ORAL_TABLET | Freq: Every day | ORAL | Status: DC
  Administered 2018-12-13 – 2018-12-14 (×2): 20 mg via ORAL
  Filled 2018-12-13 (×2): qty 1

## 2018-12-13 MED ORDER — OMEGA-3-ACID ETHYL ESTERS 1 G PO CAPS
2.0000 g | ORAL_CAPSULE | Freq: Every day | ORAL | Status: DC
  Administered 2018-12-14: 2 g via ORAL
  Filled 2018-12-13: qty 2

## 2018-12-13 NOTE — Progress Notes (Signed)
Physical Therapy Treatment Patient Details Name: Daniel Eaton. MRN: [id number] DOB: August 16, 1940 Today's Date: 12/13/2018    History of Present Illness Pt is admitted for rhabdomyolysis with history of fall at home where he laid in floor for prolonged period of time. Troponin elevated, however attributed to demand ischemia. History includes Afib, COPD, HTN, obesity, and CVA.     PT Comments    Pt is progressing toward goals. Upon entry, pt is resting in bed with case management in room. He expresses high expectations for his performance today during session. This date, pt performs bed mobility/ transfers with max assist +2 and ambulation with close +2 CGA, chair follow, and RW. Ambulatory distance limited 2/2 fatigue and pt having BM/requiring urgent use of bed pan. Pt will continue to benefit from skilled PT to address deficits in strength, mobility, activity tolerance, and pain management. Current recommendation for follow-up care remains SNF due to not functioning at baseline.     Follow Up Recommendations  SNF     Equipment Recommendations  None recommended by PT    Recommendations for Other Services       Precautions / Restrictions Precautions Precautions: Fall Restrictions Weight Bearing Restrictions: No    Mobility  Bed Mobility Overal bed mobility: Needs Assistance Bed Mobility: Supine to Sit     Supine to sit: Max assist;+2 for physical assistance     General bed mobility comments: Pt requires +2 max assist to reach EOB; support for trunk as well as moving bilateral LE's to edge of bed. Chux pad used to shift pt's weight accordingly at EOB  Transfers Overall transfer level: Needs assistance Equipment used: Rolling walker (2 wheeled) Transfers: Sit to/from Stand Sit to Stand: Max assist;+2 physical assistance         General transfer comment: Able to perform transfer with +2 max assist. Requires bed raise to stand from bed; increased difficulty from low  surface i.e. recliner. Increased time required  Ambulation/Gait Ambulation/Gait assistance: Min guard;+2 safety/equipment Gait Distance (Feet): 25 Feet Assistive device: Rolling walker (2 wheeled) Gait Pattern/deviations: Step-through pattern;Wide base of support;Trunk flexed;Decreased stride length;Decreased stance time - left     General Gait Details: Pt is able to amb with step through pattern using RW with +2 CGA and chair follow. Amb distance limited by fatigue and pt was noted to be having a BM. Increased time required    Stairs             Wheelchair Mobility    Modified Rankin (Stroke Patients Only)       Balance Overall balance assessment: Needs assistance;History of Falls Sitting-balance support: Feet supported Sitting balance-Leahy Scale: Poor Sitting balance - Comments: Support behind back to prevent posterior LOB   Standing balance support: Bilateral upper extremity supported Standing balance-Leahy Scale: Fair Standing balance comment: Heavy reliance on UE support from RW; close CGA due to pt appearing unsteady.                            Cognition Arousal/Alertness: Awake/alert Behavior During Therapy: WFL for tasks assessed/performed Overall Cognitive Status: Within Functional Limits for tasks assessed                                        Exercises Other Exercises Other Exercises: Seated EOB, 10 reps, bilateral: functional reaching, march, LAQ; PT supervision  General Comments General comments (skin integrity, edema, etc.): Pt is eager to attempt amb and seems overly optimistic about his capabilities for movement. Seems agreeable to follow-up recommendation but is disappointed in his performance.       Pertinent Vitals/Pain Pain Assessment: 0-10 Pain Score: 6  Pain Location: "all over" Pain Descriptors / Indicators: Aching;Discomfort;Dull;Grimacing Pain Intervention(s): Limited activity within patient's  tolerance;Monitored during session;Repositioned    Home Living                      Prior Function            PT Goals (current goals can now be found in the care plan section) Acute Rehab PT Goals Patient Stated Goal: to be able to walk to the bathroom PT Goal Formulation: With patient Time For Goal Achievement: 12/26/18 Potential to Achieve Goals: Good Progress towards PT goals: Progressing toward goals    Frequency    Min 2X/week      PT Plan Current plan remains appropriate    Co-evaluation              AM-PAC PT "6 Clicks" Mobility   Outcome Measure  Help needed turning from your back to your side while in a flat bed without using bedrails?: A Lot Help needed moving from lying on your back to sitting on the side of a flat bed without using bedrails?: A Lot Help needed moving to and from a bed to a chair (including a wheelchair)?: A Lot Help needed standing up from a chair using your arms (e.g., wheelchair or bedside chair)?: A Lot Help needed to walk in hospital room?: A Lot Help needed climbing 3-5 steps with a railing? : Total 6 Click Score: 11    End of Session Equipment Utilized During Treatment: Gait belt Activity Tolerance: Patient limited by fatigue Patient left: in chair;with call bell/phone within reach;with nursing/sitter in room Nurse Communication: Mobility status PT Visit Diagnosis: Unsteadiness on feet (R26.81);Muscle weakness (generalized) (M62.81);History of falling (Z91.81);Difficulty in walking, not elsewhere classified (R26.2);Pain Pain - Right/Left: Right Pain - part of body: Shoulder;Hip;Knee;Leg     Time: 1101-1135 PT Time Calculation (min) (ACUTE ONLY): 34 min  Charges:                        Dixie Dials, SPT    Daniel Eaton 12/13/2018, 12:18 PM

## 2018-12-13 NOTE — Progress Notes (Signed)
RN CM initiated authorization with [place] @ [employer].

## 2018-12-13 NOTE — Progress Notes (Addendum)
Daniel Eaton at [place] NAME: Daniel Eaton    MR#:  [id number]  DATE OF BIRTH:  09-27-1940  SUBJECTIVE:   Patient states he is feeling little bit better today.  He was initially refusing SNF, but he worked with PT today and realized that he should go to SNF. He is much weaker than he realized.  REVIEW OF SYSTEMS:  Review of Systems  Constitutional: Negative for chills and fever.  HENT: Negative for congestion and sore throat.   Eyes: Negative for blurred vision and double vision.  Respiratory: Negative for cough and shortness of breath.   Cardiovascular: Negative for chest pain and palpitations.  Gastrointestinal: Negative for nausea and vomiting.  Genitourinary: Negative for dysuria and urgency.  Musculoskeletal: Positive for back pain, falls and myalgias. Negative for joint pain.  Neurological: Negative for dizziness and headaches.  Psychiatric/Behavioral: Negative for depression. The patient is not nervous/anxious.     DRUG ALLERGIES:   Allergies  Allergen Reactions   Diltiazem Hcl Itching and Other (See Comments)    edema   VITALS:  Blood pressure 118/80, pulse 68, temperature 98.9 F (37.2 C), temperature source Oral, resp. rate 16, height 5\' 11"  (1.803 m), weight 132.9 kg, SpO2 97 %. PHYSICAL EXAMINATION:  Physical Exam  GENERAL:  Laying in the bed with no acute distress.  HEENT: Head atraumatic, normocephalic. Pupils equal, round, reactive to light and accommodation. No scleral icterus. Extraocular muscles intact. Oropharynx and nasopharynx clear.  NECK:  Supple, no jugular venous distention. No thyroid enlargement. LUNGS: Lungs are clear to auscultation bilaterally. No wheezes, crackles, rhonchi. No use of accessory muscles of respiration.  CARDIOVASCULAR: RRR, S1, S2 normal. No murmurs, rubs, or gallops.  ABDOMEN: Soft, nontender, nondistended. Bowel sounds present.  EXTREMITIES: No pedal edema, cyanosis, or clubbing. +  Bilateral thighs are tender to palpation. NEUROLOGIC: CN 2-12 intact, no focal deficits. 5/5 muscle strength throughout all extremities. Sensation intact throughout. Gait not checked.  PSYCHIATRIC: The patient is alert and oriented x 3.  SKIN: No obvious rash, lesion, or ulcer. + Multiple abrasions present on the arms bilaterally. +chronic venous stasis changes in the lower extremities bilaterally. LABORATORY PANEL:  Male CBC Recent Labs  Lab 12/12/18 0414  WBC 9.8  HGB 11.1*  HCT 34.2*  PLT 170   ------------------------------------------------------------------------------------------------------------------ Chemistries  Recent Labs  Lab 12/10/18 1856  12/13/18 0447  NA 135   < > 136  K 4.5   < > 4.2  CL 96*   < > 106  CO2 24   < > 24  GLUCOSE 85   < > 86  BUN 33*   < > 19  CREATININE 2.12*   < > 1.17  CALCIUM 9.4   < > 8.2*  AST 111*  --   --   ALT 40  --   --   ALKPHOS 69  --   --   BILITOT 1.1  --   --    < > = values in this interval not displayed.   RADIOLOGY:  No results found. ASSESSMENT AND PLAN:   Acute rhabdomyolysis resolved. -Off IVFs   CKD IIIa initially with AKI due to acute rhabdomyolysis, but creatinine has returned to baseline. -Avoid nephrotoxic agents -Restart home torsemide today -Holding home metolazone  Generalized weakness/fall/patient fell at home and was not able to get himself up. -PT recommending SNF   Elevated troponinsecondary to demand ischemia with acute renal failure. -Monitor   Essential hypertensionBP  well controlled -Continue home metoprolol   Paroxysmal atrial fibrillationin normal sinus rhythm here -Continue home metoprolol, amiodarone, and Xarelto   GERDstable -Continue home Protonix   COPDstable, no signs of acute exacerbation -Continue home inhalers  Patient initially refused SNF, but is now agreeable.  Awaiting insurance authorization. Wife, Daniel Eaton, updated via the phone.   All the records are reviewed and case  discussed with Care Management/Social Worker. Management plans discussed with the patient, family and they are in agreement.  CODE STATUS: Full Code  TOTAL TIME TAKING CARE OF THIS PATIENT: 37 minutes.   More than 50% of the time was spent in counseling/coordination of care: YES  POSSIBLE D/C IN 1-2 DAYS, DEPENDING ON CLINICAL CONDITION.   Berna Spare  M.D on 12/13/2018 at 1:42 PM  Between 7am to 6pm - Pager [phone]  After 6pm go to [email or web address] - Technical brewer [facility]  Office  [phone]  CC: Primary care physician; [name], MD  Note: This dictation was prepared with Dragon dictation along with smaller phrase technology. Any transcriptional errors that result from this process are unintentional.

## 2018-12-13 NOTE — TOC Initial Note (Signed)
Transition of Care (TOC) - Initial/Assessment Note    Patient Details  Name: Daniel Eaton. MRN: [id number] Date of Birth: 11/19/1940  Transition [facility]) CM/SW Contact:    Anselm Pancoast, RN Phone Number: 12/13/2018, 2:35 PM  Clinical Narrative:                  Pt admitted from hall after suffering a fall resulting in Rhabdomyolysis. Patient lives at home with wife who works Friday, Saturday and Sunday 3pm-8pm.  PCP: Dr. Ouida Sills [facility] [place] Pt states he is currently in "doughnut hole" but able to afford medications.  Patient previously at [facility] and does not wish to return. Patient had past experiences with [facility] and wishes to return home with [facility] once medically able. Prior to admission patient was participating in outpatient therapy with [facility].  PT completed evaluation and recommendation for skilled nursing facility for continued therapy. Patient was hopeful to return home with home health care however after speaking with wife and case manager at the bedside patient is agreeable to skilled nursing facility. [facility] existing. Bedsearch complete. Patient and wife selected [facility]. Kelly at [facility] notified and following up on bed availability.  RN CM initiated authorization request with [employer].   Expected Discharge Plan: Skilled Nursing Facility Barriers to Discharge: Continued Medical Work up   Patient Goals and CMS Choice Patient states their goals for this hospitalization and ongoing recovery are:: to go home with home health care CMS Medicare.gov Compare Post Acute Care list provided to:: Patient Choice offered to / list presented to : Patient, Spouse  Expected Discharge Plan and Services Expected Discharge Plan: [place]   Discharge Planning Services: CM Consult Post Acute Care Choice: Home Health, [place] Living arrangements for the past 2 months: Single Family  Home Expected Discharge Date: 12/12/18                                    Prior Living Arrangements/Services Living arrangements for the past 2 months: Single Family Home Lives with:: Spouse Patient language and need for interpreter reviewed:: Yes Do you feel safe going back to the place where you live?: Yes      Need for Family Participation in Patient Care: Yes (Comment) Care giver support system in place?: Yes (comment) Current home services: DME, Safety alert Criminal Activity/Legal Involvement Pertinent to Current Situation/Hospitalization: No - Comment as needed  Activities of Daily Living Home Assistive Devices/Equipment: Gilford Rile (specify type) ADL Screening (condition at time of admission) Patient's cognitive ability adequate to safely complete daily activities?: Yes Is the patient deaf or have difficulty hearing?: Yes Does the patient have difficulty seeing, even when wearing glasses/contacts?: No Does the patient have difficulty concentrating, remembering, or making decisions?: No Patient able to express need for assistance with ADLs?: Yes Does the patient have difficulty dressing or bathing?: Yes Independently performs ADLs?: No Communication: Independent Dressing (OT): Needs assistance Is this a change from baseline?: Change from baseline, expected to last >3 days Grooming: Needs assistance Is this a change from baseline?: Change from baseline, expected to last >3 days Feeding: Independent Bathing: Dependent Is this a change from baseline?: Change from baseline, expected to last >3 days Toileting: Dependent Is this a change from baseline?: Change from baseline, expected to last >3days In/Out Bed: Needs assistance Is this a change from baseline?: Change from  baseline, expected to last >3 days Walks in Home: Needs assistance Is this a change from baseline?: Change from baseline, expected to last >3 days Does the patient have difficulty walking or climbing  stairs?: Yes Weakness of Legs: Both Weakness of Arms/Hands: Both  Permission Sought/Granted Permission sought to share information with : Case Manager, Customer service manager Permission granted to share information with : Yes, Verbal Permission Granted              Emotional Assessment Appearance:: Appears older than stated age Attitude/Demeanor/Rapport: Engaged Affect (typically observed): Hopeful Orientation: : Oriented to Self, Oriented to Place, Oriented to  Time, Oriented to Situation   Psych Involvement: No (comment)  Admission diagnosis:  Elevated troponin [R77.8] Fall, initial encounter [W19.XXXA] Non-traumatic rhabdomyolysis [M62.82] Patient Active Problem List   Diagnosis Date Noted  . Rhabdomyolysis 12/10/2018  . Sepsis ([place]) 07/28/2018  . Cough 03/20/2015  . Bronchitis, chronic obstructive w acute bronchitis ([place]) 03/20/2015  . Umbilical hernia without obstruction and without gangrene 01/12/2015  . Ventral hernia without obstruction or gangrene 01/02/2015   PCP:  [name], MD Pharmacy[facility]   [id number] [place], [place] [address] [place] [address] Phone: [phone] Fax: [phone]     Social Determinants of Health (SDOH) Interventions    Readmission Risk Interventions No flowsheet data found.

## 2018-12-13 NOTE — Care Management Important Message (Signed)
Important Message  Patient Details  Name: Daniel Eaton. MRN: [id number] Date of Birth: [birth year]   Medicare Important Message Given:  Yes     Juliann Pulse A Luberta Grabinski 12/13/2018, 11:18 AM

## 2018-12-14 DIAGNOSIS — N1831 Chronic kidney disease, stage 3a: Secondary | ICD-10-CM | POA: Diagnosis not present

## 2018-12-14 DIAGNOSIS — N179 Acute kidney failure, unspecified: Secondary | ICD-10-CM | POA: Diagnosis not present

## 2018-12-14 DIAGNOSIS — Z9181 History of falling: Secondary | ICD-10-CM | POA: Diagnosis not present

## 2018-12-14 DIAGNOSIS — Z23 Encounter for immunization: Secondary | ICD-10-CM | POA: Diagnosis not present

## 2018-12-14 DIAGNOSIS — J449 Chronic obstructive pulmonary disease, unspecified: Secondary | ICD-10-CM | POA: Diagnosis not present

## 2018-12-14 DIAGNOSIS — I4891 Unspecified atrial fibrillation: Secondary | ICD-10-CM | POA: Diagnosis not present

## 2018-12-14 DIAGNOSIS — Z7401 Bed confinement status: Secondary | ICD-10-CM | POA: Diagnosis not present

## 2018-12-14 DIAGNOSIS — W010XXA Fall on same level from slipping, tripping and stumbling without subsequent striking against object, initial encounter: Secondary | ICD-10-CM | POA: Diagnosis not present

## 2018-12-14 DIAGNOSIS — G4730 Sleep apnea, unspecified: Secondary | ICD-10-CM | POA: Diagnosis not present

## 2018-12-14 DIAGNOSIS — R609 Edema, unspecified: Secondary | ICD-10-CM | POA: Diagnosis not present

## 2018-12-14 DIAGNOSIS — E7849 Other hyperlipidemia: Secondary | ICD-10-CM | POA: Diagnosis not present

## 2018-12-14 DIAGNOSIS — R531 Weakness: Secondary | ICD-10-CM | POA: Diagnosis not present

## 2018-12-14 DIAGNOSIS — K219 Gastro-esophageal reflux disease without esophagitis: Secondary | ICD-10-CM | POA: Diagnosis not present

## 2018-12-14 DIAGNOSIS — N183 Chronic kidney disease, stage 3 unspecified: Secondary | ICD-10-CM | POA: Diagnosis not present

## 2018-12-14 DIAGNOSIS — M6282 Rhabdomyolysis: Secondary | ICD-10-CM | POA: Diagnosis not present

## 2018-12-14 DIAGNOSIS — D649 Anemia, unspecified: Secondary | ICD-10-CM | POA: Diagnosis not present

## 2018-12-14 DIAGNOSIS — L0390 Cellulitis, unspecified: Secondary | ICD-10-CM | POA: Diagnosis not present

## 2018-12-14 DIAGNOSIS — Y92009 Unspecified place in unspecified non-institutional (private) residence as the place of occurrence of the external cause: Secondary | ICD-10-CM | POA: Diagnosis not present

## 2018-12-14 DIAGNOSIS — M6281 Muscle weakness (generalized): Secondary | ICD-10-CM | POA: Diagnosis not present

## 2018-12-14 DIAGNOSIS — I639 Cerebral infarction, unspecified: Secondary | ICD-10-CM | POA: Diagnosis not present

## 2018-12-14 DIAGNOSIS — I480 Paroxysmal atrial fibrillation: Secondary | ICD-10-CM | POA: Diagnosis not present

## 2018-12-14 DIAGNOSIS — R6889 Other general symptoms and signs: Secondary | ICD-10-CM | POA: Diagnosis not present

## 2018-12-14 DIAGNOSIS — W19XXXD Unspecified fall, subsequent encounter: Secondary | ICD-10-CM | POA: Diagnosis not present

## 2018-12-14 DIAGNOSIS — I872 Venous insufficiency (chronic) (peripheral): Secondary | ICD-10-CM | POA: Diagnosis not present

## 2018-12-14 DIAGNOSIS — R262 Difficulty in walking, not elsewhere classified: Secondary | ICD-10-CM | POA: Diagnosis not present

## 2018-12-14 DIAGNOSIS — R778 Other specified abnormalities of plasma proteins: Secondary | ICD-10-CM | POA: Diagnosis not present

## 2018-12-14 DIAGNOSIS — D72829 Elevated white blood cell count, unspecified: Secondary | ICD-10-CM | POA: Diagnosis not present

## 2018-12-14 DIAGNOSIS — M2550 Pain in unspecified joint: Secondary | ICD-10-CM | POA: Diagnosis not present

## 2018-12-14 DIAGNOSIS — E860 Dehydration: Secondary | ICD-10-CM | POA: Diagnosis not present

## 2018-12-14 DIAGNOSIS — I10 Essential (primary) hypertension: Secondary | ICD-10-CM | POA: Diagnosis not present

## 2018-12-14 LAB — BASIC METABOLIC PANEL
Anion gap: 10 (ref 5–15)
BUN: 18 mg/dL (ref 8–23)
CO2: 24 mmol/L (ref 22–32)
Calcium: 8.1 mg/dL — ABNORMAL LOW (ref 8.9–10.3)
Chloride: 104 mmol/L (ref 98–111)
Creatinine, Ser: 1.21 mg/dL (ref 0.61–1.24)
GFR calc Af Amer: >60 mL/min (ref >60)
GFR calc non Af Amer: 57 mL/min — ABNORMAL LOW (ref >60)
Glucose, Bld: 110 mg/dL — ABNORMAL HIGH (ref 70–99)
Potassium: 3.9 mmol/L (ref 3.5–5.1)
Sodium: 138 mmol/L (ref 135–145)

## 2018-12-14 LAB — SARS CORONAVIRUS 2 (TAT 6-24 HRS): SARS Coronavirus 2: NEGATIVE

## 2018-12-14 MED ORDER — RIVAROXABAN 20 MG PO TABS: 20.0000 mg | ORAL_TABLET | Freq: Every day | ORAL | 0 refills | Status: DC

## 2018-12-14 MED ORDER — POTASSIUM CHLORIDE CRYS ER 10 MEQ PO TBCR
10.0000 meq | EXTENDED_RELEASE_TABLET | Freq: Three times a day (TID) | ORAL | Status: DC
  Administered 2018-12-14: 10 meq via ORAL
  Filled 2018-12-14: qty 1

## 2018-12-14 MED ORDER — SPIRONOLACTONE 25 MG PO TABS
25.0000 mg | ORAL_TABLET | Freq: Every day | ORAL | Status: DC
  Administered 2018-12-14: 25 mg via ORAL
  Filled 2018-12-14: qty 1

## 2018-12-14 MED ORDER — SPIRONOLACTONE 25 MG PO TABS: 25.0000 mg | ORAL_TABLET | Freq: Every day | ORAL | 0 refills | Status: DC

## 2018-12-14 MED ORDER — POTASSIUM CHLORIDE CRYS ER 10 MEQ PO TBCR: 10.0000 meq | EXTENDED_RELEASE_TABLET | Freq: Three times a day (TID) | ORAL | 0 refills | Status: DC

## 2018-12-14 NOTE — Discharge Summary (Signed)
[facility] [place] NAME: Jyquan Harder    MR#:  [id number]  DATE OF BIRTH:  [birth year]  DATE OF ADMISSION:  12/10/2018   ADMITTING PHYSICIAN: Henreitta Leber, MD  DATE OF DISCHARGE: 12/14/18  PRIMARY CARE PHYSICIAN: [name], MD   ADMISSION DIAGNOSIS:  Elevated troponin [R77.8] Fall, initial encounter [id number]] Non-traumatic rhabdomyolysis [M62.82] DISCHARGE DIAGNOSIS:  Active Problems:   Rhabdomyolysis  SECONDARY DIAGNOSIS:   Past Medical History:  Diagnosis Date   A-fib ([place])    Cellulitis    CHF (congestive heart failure) (HCC)    COPD (chronic obstructive pulmonary disease) ([place])    Difficult intubation    Hyperlipidemia    Hypertension    Hypothyroidism    Kidney disease    Sleep apnea    Stroke [facility])    Thyroid disease    HOSPITAL COURSE:   Daniel Eaton is a 78 year old male who presented to the ED after a fall.  Patient states that he was laying on the bathroom floor for 5 hours because he could not get up.  His wife finally found him and brought him to the ED. He was noted to have an elevated CK and an AKI.  He was admitted for further management.  Acute rhabdomyolysis- due to laying on the floor for 5 hours.  Resolved with IV fluids.  CKD IIIa- initially with AKI due to acute rhabdomyolysis, but creatinine has returned to baseline. -Monitor creatinine as an outpatient  Generalized weakness/fall/patient fell at home and was not able to get himself up. -PT recommending SNF  Elevated troponin-secondary to demand ischemia with acute renal failure. -Monitor  Essential hypertension-BP well controlled -Continue home metoprolol  Paroxysmal atrial fibrillation-in normal sinus rhythm here -Continue home metoprolol, amiodarone, and Xarelto  GERD-stable -Continue home Protonix  COPD-stable, no signs of acute exacerbation -Continue home inhalers  Chronic bilateral lower extremity  edema- likely due to chronic venous stasis. -Home torsemide, spironolactone, and metolazone were restarted on discharge  DISCHARGE CONDITIONS:  Generalized weakness Essential hypertension Paroxysmal atrial fibrillation GERD COPD Chronic bilateral lower extremity edema CONSULTS OBTAINED:  None DRUG ALLERGIES:   Allergies  Allergen Reactions   Diltiazem Hcl Itching and Other (See Comments)    edema   DISCHARGE MEDICATIONS:   Allergies as of 12/14/2018      Reactions   Diltiazem Hcl Itching, Other (See Comments)   edema      Medication List    TAKE these medications   amiodarone 200 MG tablet Commonly known as: PACERONE Take 200 mg by mouth daily.   levothyroxine 88 MCG tablet Commonly known as: SYNTHROID Take 88 mcg by mouth daily.   loratadine 10 MG tablet Commonly known as: CLARITIN Take 10 mg by mouth daily.   metolazone 2.5 MG tablet Commonly known as: ZAROXOLYN Take 2.5 mg by mouth 2 (two) times a week. Monday and Friday   metoprolol tartrate 100 MG tablet Commonly known as: LOPRESSOR Take 100 mg by mouth 2 (two) times daily.   Multi-Vitamins Tabs Take 1 tablet by mouth daily.   OMEGA 3 PO Take 2 capsules by mouth daily.   omeprazole 40 MG capsule Commonly known as: PRILOSEC Take 40 mg by mouth daily.   polyethylene glycol 17 g packet Commonly known as: MIRALAX / GLYCOLAX Take 17 g by mouth daily as needed.   potassium chloride 10 MEQ CR capsule Commonly known as: MICRO-K TAKE 2 CAPSULES (20 MEQ TOTAL) BY MOUTH 3 (THREE) TIMES  DAILY   potassium chloride 10 MEQ tablet Commonly known as: KLOR-CON Take 1 tablet (10 mEq total) by mouth 3 (three) times daily.   rivaroxaban 20 MG Tabs tablet Commonly known as: XARELTO Take 1 tablet (20 mg total) by mouth daily with supper. What changed:   medication strength  how much to take  when to take this   spironolactone 25 MG tablet Commonly known as: ALDACTONE Take 1 tablet (25 mg total) by  mouth daily. Start taking on: December 15, 2018   torsemide 20 MG tablet Commonly known as: DEMADEX Take 1 tablet (20 mg total) by mouth daily.   Trelegy Ellipta 100-62.5-25 MCG/INH Aepb Generic drug: Fluticasone-Umeclidin-Vilant Inhale 1 puff into the lungs daily.        DISCHARGE INSTRUCTIONS:  1.  Follow-up with PCP in 5 days 2.  Recheck BMP as an outpatient DIET:  Cardiac diet DISCHARGE CONDITION:  Stable ACTIVITY:  Activity as tolerated OXYGEN:  Home Oxygen: No.  Oxygen Delivery: room air DISCHARGE LOCATION:  nursing home   If you experience worsening of your admission symptoms, develop shortness of breath, life threatening emergency, suicidal or homicidal thoughts you must seek medical attention immediately by calling 911 or calling your MD immediately  if symptoms less severe.  You Must read complete instructions/literature along with all the possible adverse reactions/side effects for all the Medicines you take and that have been prescribed to you. Take any new Medicines after you have completely understood and accpet all the possible adverse reactions/side effects.   Please note  You were cared for by a hospitalist during your hospital stay. If you have any questions about your discharge medications or the care you received while you were in the hospital after you are discharged, you can call the unit and asked to speak with the hospitalist on call if the hospitalist that took care of you is not available. Once you are discharged, your primary care physician will handle any further medical issues. Please note that NO REFILLS for any discharge medications will be authorized once you are discharged, as it is imperative that you return to your primary care physician (or establish a relationship with a primary care physician if you do not have one) for your aftercare needs so that they can reassess your need for medications and monitor your lab values.    On the day of  Discharge:  VITAL SIGNS:  Blood pressure 137/74, pulse 72, temperature 98.5 F (36.9 C), temperature source Oral, resp. rate 18, height 5\' 11"  (1.803 m), weight 132.9 kg, SpO2 99 %. PHYSICAL EXAMINATION:  GENERAL:Laying in the bed with no acute distress.  HEENT: Head atraumatic, normocephalic. Pupils equal, round, reactive to light and accommodation. No scleral icterus. Extraocular muscles intact. Oropharynx and nasopharynx clear.  NECK: Supple, no jugular venous distention. No thyroid enlargement. LUNGS:Lungs are clear to auscultation bilaterally. No wheezes, crackles, rhonchi.No use of accessory muscles of respiration.  CARDIOVASCULAR: RRR, S1, S2 normal. No murmurs, rubs, or gallops.  ABDOMEN: Soft, nontender, nondistended. Bowel sounds present.  EXTREMITIES: No pedal edema, cyanosis, or clubbing. + Bilateral thighs are tender to palpation. NEUROLOGIC:CN 2-12 intact, no focal deficits. 5/5 muscle strength throughout all extremities. Sensation intact throughout. Gait not checked.  PSYCHIATRIC: The patient is alert and oriented x 3.  SKIN: No obvious rash, lesion, or ulcer. + Multiple abrasions present on the arms bilaterally. +chronic venous stasis changes in the lower extremities bilaterally. DATA REVIEW:   CBC Recent Labs  Lab 12/12/18 0414  WBC 9.8  HGB 11.1*  HCT 34.2*  PLT 170    Chemistries  Recent Labs  Lab 12/10/18 1856  12/14/18 0452  NA 135   < > 138  K 4.5   < > 3.9  CL 96*   < > 104  CO2 24   < > 24  GLUCOSE 85   < > 110*  BUN 33*   < > 18  CREATININE 2.12*   < > 1.21  CALCIUM 9.4   < > 8.1*  AST 111*  --   --   ALT 40  --   --   ALKPHOS 69  --   --   BILITOT 1.1  --   --    < > = values in this interval not displayed.     Microbiology Results  Results for orders placed or performed during the hospital encounter of 12/10/18  SARS CORONAVIRUS 2 (TAT 6-24 HRS) Nasopharyngeal Nasopharyngeal Swab     Status: None   Collection Time: 12/10/18  9:54 PM     Specimen: Nasopharyngeal Swab  Result Value Ref Range Status   SARS Coronavirus 2 NEGATIVE NEGATIVE Final    Comment: (NOTE) SARS-CoV-2 target nucleic acids are NOT DETECTED. The SARS-CoV-2 RNA is generally detectable in upper and lower respiratory specimens during the acute phase of infection. Negative results do not preclude SARS-CoV-2 infection, do not rule out co-infections with other pathogens, and should not be used as the sole basis for treatment or other patient management decisions. Negative results must be combined with clinical observations, patient history, and epidemiological information. The expected result is Negative. Fact Sheet for Patients: SugarRoll.be Fact Sheet for Healthcare Providers: https://www.woods-mathews.com/ This test is not yet approved or cleared by the [place] FDA and  has been authorized for detection and/or diagnosis of SARS-CoV-2 by FDA under an Emergency Use Authorization (EUA). This EUA will remain  in effect (meaning this test can be used) for the duration of the COVID-19 declaration under Section 56 4(b)(1) of the Act, 21 U.S.C. section 360bbb-3(b)(1), unless the authorization is terminated or revoked sooner. Performed at [facility], [place] [address] [place], [place] [address]   SARS CORONAVIRUS 2 (TAT 6-24 HRS) Nasopharyngeal Nasopharyngeal Swab     Status: None   Collection Time: 12/13/18  6:24 PM   Specimen: Nasopharyngeal Swab  Result Value Ref Range Status   SARS Coronavirus 2 NEGATIVE NEGATIVE Final    Comment: (NOTE) SARS-CoV-2 target nucleic acids are NOT DETECTED. The SARS-CoV-2 RNA is generally detectable in upper and lower respiratory specimens during the acute phase of infection. Negative results do not preclude SARS-CoV-2 infection, do not rule out co-infections with other pathogens, and should not be used as the sole basis for treatment or other patient management  decisions. Negative results must be combined with clinical observations, patient history, and epidemiological information. The expected result is Negative. Fact Sheet for Patients: SugarRoll.be Fact Sheet for Healthcare Providers: https://www.woods-mathews.com/ This test is not yet approved or cleared by the [place] FDA and  has been authorized for detection and/or diagnosis of SARS-CoV-2 by FDA under an Emergency Use Authorization (EUA). This EUA will remain  in effect (meaning this test can be used) for the duration of the COVID-19 declaration under Section 56 4(b)(1) of the Act, 21 U.S.C. section 360bbb-3(b)(1), unless the authorization is terminated or revoked sooner. Performed at [facility], [place] [address] [place], [place] [address]     RADIOLOGY:  No  results found.   Management plans discussed with the patient, family and they are in agreement.  CODE STATUS: Full Code   TOTAL TIME TAKING CARE OF THIS PATIENT: 45 minutes.    Berna Spare Jake Goodson M.D on 12/14/2018 at 2:14 PM  Between 7am to 6pm - Pager [phone]  After 6pm go to [email or web address] - Technical brewer [facility]  Office  [phone]  CC: Primary care physician; [name], MD   Note: This dictation was prepared with Dragon dictation along with smaller phrase technology. Any transcriptional errors that result from this process are unintentional.

## 2018-12-14 NOTE — Progress Notes (Signed)
Patient cleared for discharge. Report called to [employer]. Spoke to [place].         Education complete. AVS printed. Discharge instructions given. All questions answered for patient clarification.  IV removed.  Awaiting EMS for transport

## 2018-12-14 NOTE — TOC Transition Note (Signed)
Transition of Care [name]) - CM/SW Discharge Note   Patient Details  Name: Daniel Eaton. MRN: [id number] Date of Birth: Jun 14, 1940  [name]) CM/SW Contact:  [name], RN Phone Number: 12/14/2018, 4:29 PM   Clinical Narrative:    Patient to discharge to [name] today Claiborne Billings at [name] aware  RNCM obtained insurance auth.  Patient and wife notified  EMS packet on chart.  Bedside RN notified   Final next level of care: Skilled Nursing Facility Barriers to Discharge: Barriers Resolved   Patient Goals and CMS Choice Patient states their goals for this hospitalization and ongoing recovery are:: to go home with home health care CMS Medicare.gov Compare Post Acute Care list provided to:: Patient Choice offered to / list presented to : Patient, Spouse  Discharge Placement              Patient [facility] bed at: [name] Patient to be transferred to facility by: EMS Name of family member notified: wife Patient and family notified of of transfer: 12/14/18  Discharge Plan and Services   Discharge Planning Services: CM Consult Post Acute Care Choice: Home Health, [name]                               Social Determinants of Health (SDOH) Interventions     Readmission Risk Interventions No flowsheet data found.

## 2018-12-17 DIAGNOSIS — M6281 Muscle weakness (generalized): Secondary | ICD-10-CM | POA: Diagnosis not present

## 2018-12-17 DIAGNOSIS — I872 Venous insufficiency (chronic) (peripheral): Secondary | ICD-10-CM | POA: Diagnosis not present

## 2018-12-17 DIAGNOSIS — J449 Chronic obstructive pulmonary disease, unspecified: Secondary | ICD-10-CM | POA: Diagnosis not present

## 2018-12-17 DIAGNOSIS — W19XXXD Unspecified fall, subsequent encounter: Secondary | ICD-10-CM | POA: Diagnosis not present

## 2018-12-25 DIAGNOSIS — W19XXXD Unspecified fall, subsequent encounter: Secondary | ICD-10-CM | POA: Diagnosis not present

## 2018-12-25 DIAGNOSIS — M6281 Muscle weakness (generalized): Secondary | ICD-10-CM | POA: Diagnosis not present

## 2018-12-25 DIAGNOSIS — J449 Chronic obstructive pulmonary disease, unspecified: Secondary | ICD-10-CM | POA: Diagnosis not present

## 2018-12-25 DIAGNOSIS — I872 Venous insufficiency (chronic) (peripheral): Secondary | ICD-10-CM | POA: Diagnosis not present

## 2019-01-05 DIAGNOSIS — R2241 Localized swelling, mass and lump, right lower limb: Secondary | ICD-10-CM | POA: Diagnosis not present

## 2019-01-05 DIAGNOSIS — M79651 Pain in right thigh: Secondary | ICD-10-CM | POA: Diagnosis not present

## 2019-01-21 DIAGNOSIS — M6282 Rhabdomyolysis: Secondary | ICD-10-CM | POA: Diagnosis not present

## 2019-01-21 DIAGNOSIS — W19XXXD Unspecified fall, subsequent encounter: Secondary | ICD-10-CM | POA: Diagnosis not present

## 2019-01-21 DIAGNOSIS — I872 Venous insufficiency (chronic) (peripheral): Secondary | ICD-10-CM | POA: Diagnosis not present

## 2019-01-21 DIAGNOSIS — R262 Difficulty in walking, not elsewhere classified: Secondary | ICD-10-CM | POA: Diagnosis not present

## 2019-01-21 DIAGNOSIS — M6281 Muscle weakness (generalized): Secondary | ICD-10-CM | POA: Diagnosis not present

## 2019-01-22 DIAGNOSIS — M6281 Muscle weakness (generalized): Secondary | ICD-10-CM | POA: Diagnosis not present

## 2019-01-22 DIAGNOSIS — Z6835 Body mass index (BMI) 35.0-35.9, adult: Secondary | ICD-10-CM | POA: Diagnosis not present

## 2019-01-24 DIAGNOSIS — I10 Essential (primary) hypertension: Secondary | ICD-10-CM | POA: Diagnosis not present

## 2019-01-24 DIAGNOSIS — J449 Chronic obstructive pulmonary disease, unspecified: Secondary | ICD-10-CM | POA: Diagnosis not present

## 2019-01-24 DIAGNOSIS — F4322 Adjustment disorder with anxiety: Secondary | ICD-10-CM | POA: Diagnosis not present

## 2019-02-09 DIAGNOSIS — J449 Chronic obstructive pulmonary disease, unspecified: Secondary | ICD-10-CM | POA: Diagnosis not present

## 2019-02-09 DIAGNOSIS — I639 Cerebral infarction, unspecified: Secondary | ICD-10-CM | POA: Diagnosis not present

## 2019-02-09 DIAGNOSIS — K219 Gastro-esophageal reflux disease without esophagitis: Secondary | ICD-10-CM | POA: Diagnosis not present

## 2019-02-09 DIAGNOSIS — R262 Difficulty in walking, not elsewhere classified: Secondary | ICD-10-CM | POA: Diagnosis not present

## 2019-02-09 DIAGNOSIS — U071 COVID-19: Secondary | ICD-10-CM | POA: Diagnosis not present

## 2019-02-09 DIAGNOSIS — I480 Paroxysmal atrial fibrillation: Secondary | ICD-10-CM | POA: Diagnosis not present

## 2019-02-09 DIAGNOSIS — N1831 Chronic kidney disease, stage 3a: Secondary | ICD-10-CM | POA: Diagnosis not present

## 2019-02-09 DIAGNOSIS — W19XXXA Unspecified fall, initial encounter: Secondary | ICD-10-CM | POA: Diagnosis not present

## 2019-02-09 DIAGNOSIS — G4733 Obstructive sleep apnea (adult) (pediatric): Secondary | ICD-10-CM | POA: Diagnosis not present

## 2019-02-09 DIAGNOSIS — D72829 Elevated white blood cell count, unspecified: Secondary | ICD-10-CM | POA: Diagnosis not present

## 2019-02-09 DIAGNOSIS — R609 Edema, unspecified: Secondary | ICD-10-CM | POA: Diagnosis not present

## 2019-02-09 DIAGNOSIS — E7849 Other hyperlipidemia: Secondary | ICD-10-CM | POA: Diagnosis not present

## 2019-02-09 DIAGNOSIS — R778 Other specified abnormalities of plasma proteins: Secondary | ICD-10-CM | POA: Diagnosis not present

## 2019-02-09 DIAGNOSIS — G4730 Sleep apnea, unspecified: Secondary | ICD-10-CM | POA: Diagnosis not present

## 2019-02-09 DIAGNOSIS — M6282 Rhabdomyolysis: Secondary | ICD-10-CM | POA: Diagnosis not present

## 2019-02-09 DIAGNOSIS — R279 Unspecified lack of coordination: Secondary | ICD-10-CM | POA: Diagnosis not present

## 2019-02-09 DIAGNOSIS — Z9181 History of falling: Secondary | ICD-10-CM | POA: Diagnosis not present

## 2019-02-09 DIAGNOSIS — M6281 Muscle weakness (generalized): Secondary | ICD-10-CM | POA: Diagnosis not present

## 2019-02-09 DIAGNOSIS — I10 Essential (primary) hypertension: Secondary | ICD-10-CM | POA: Diagnosis not present

## 2019-02-09 DIAGNOSIS — R531 Weakness: Secondary | ICD-10-CM | POA: Diagnosis not present

## 2019-02-09 DIAGNOSIS — E860 Dehydration: Secondary | ICD-10-CM | POA: Diagnosis not present

## 2019-02-09 DIAGNOSIS — W19XXXD Unspecified fall, subsequent encounter: Secondary | ICD-10-CM | POA: Diagnosis not present

## 2019-02-09 DIAGNOSIS — Z743 Need for continuous supervision: Secondary | ICD-10-CM | POA: Diagnosis not present

## 2019-03-01 DIAGNOSIS — I480 Paroxysmal atrial fibrillation: Secondary | ICD-10-CM | POA: Diagnosis not present

## 2019-03-01 DIAGNOSIS — N1831 Chronic kidney disease, stage 3a: Secondary | ICD-10-CM | POA: Diagnosis not present

## 2019-03-01 DIAGNOSIS — E7849 Other hyperlipidemia: Secondary | ICD-10-CM | POA: Diagnosis not present

## 2019-03-01 DIAGNOSIS — M6281 Muscle weakness (generalized): Secondary | ICD-10-CM | POA: Diagnosis not present

## 2019-03-13 DIAGNOSIS — G4733 Obstructive sleep apnea (adult) (pediatric): Secondary | ICD-10-CM | POA: Diagnosis not present

## 2019-03-13 DIAGNOSIS — U071 COVID-19: Secondary | ICD-10-CM | POA: Diagnosis not present

## 2019-03-13 DIAGNOSIS — J449 Chronic obstructive pulmonary disease, unspecified: Secondary | ICD-10-CM | POA: Diagnosis not present

## 2019-03-18 DIAGNOSIS — D72829 Elevated white blood cell count, unspecified: Secondary | ICD-10-CM | POA: Diagnosis not present

## 2019-03-18 DIAGNOSIS — L03115 Cellulitis of right lower limb: Secondary | ICD-10-CM | POA: Diagnosis not present

## 2019-03-18 DIAGNOSIS — U071 COVID-19: Secondary | ICD-10-CM | POA: Diagnosis not present

## 2019-03-18 DIAGNOSIS — I480 Paroxysmal atrial fibrillation: Secondary | ICD-10-CM | POA: Diagnosis not present

## 2019-03-18 DIAGNOSIS — M6281 Muscle weakness (generalized): Secondary | ICD-10-CM | POA: Diagnosis not present

## 2019-03-18 DIAGNOSIS — I739 Peripheral vascular disease, unspecified: Secondary | ICD-10-CM | POA: Diagnosis not present

## 2019-03-18 DIAGNOSIS — I130 Hypertensive heart and chronic kidney disease with heart failure and stage 1 through stage 4 chronic kidney disease, or unspecified chronic kidney disease: Secondary | ICD-10-CM | POA: Diagnosis not present

## 2019-03-18 DIAGNOSIS — L97919 Non-pressure chronic ulcer of unspecified part of right lower leg with unspecified severity: Secondary | ICD-10-CM | POA: Diagnosis not present

## 2019-03-18 DIAGNOSIS — N1831 Chronic kidney disease, stage 3a: Secondary | ICD-10-CM | POA: Diagnosis not present

## 2019-03-18 DIAGNOSIS — J449 Chronic obstructive pulmonary disease, unspecified: Secondary | ICD-10-CM | POA: Diagnosis not present

## 2019-03-18 DIAGNOSIS — G4733 Obstructive sleep apnea (adult) (pediatric): Secondary | ICD-10-CM | POA: Diagnosis not present

## 2019-03-18 DIAGNOSIS — I509 Heart failure, unspecified: Secondary | ICD-10-CM | POA: Diagnosis not present

## 2019-03-18 DIAGNOSIS — K219 Gastro-esophageal reflux disease without esophagitis: Secondary | ICD-10-CM | POA: Diagnosis not present

## 2019-03-28 DIAGNOSIS — I129 Hypertensive chronic kidney disease with stage 1 through stage 4 chronic kidney disease, or unspecified chronic kidney disease: Secondary | ICD-10-CM | POA: Diagnosis not present

## 2019-03-28 DIAGNOSIS — E039 Hypothyroidism, unspecified: Secondary | ICD-10-CM | POA: Diagnosis not present

## 2019-03-28 DIAGNOSIS — N183 Chronic kidney disease, stage 3 unspecified: Secondary | ICD-10-CM | POA: Diagnosis not present

## 2019-03-28 DIAGNOSIS — Z0000 Encounter for general adult medical examination without abnormal findings: Secondary | ICD-10-CM | POA: Diagnosis not present

## 2019-03-28 DIAGNOSIS — R739 Hyperglycemia, unspecified: Secondary | ICD-10-CM | POA: Diagnosis not present

## 2019-03-29 DIAGNOSIS — J449 Chronic obstructive pulmonary disease, unspecified: Secondary | ICD-10-CM | POA: Diagnosis not present

## 2019-03-29 DIAGNOSIS — G4733 Obstructive sleep apnea (adult) (pediatric): Secondary | ICD-10-CM | POA: Diagnosis not present

## 2019-04-03 DIAGNOSIS — L03115 Cellulitis of right lower limb: Secondary | ICD-10-CM | POA: Diagnosis not present

## 2019-04-03 DIAGNOSIS — B351 Tinea unguium: Secondary | ICD-10-CM | POA: Diagnosis not present

## 2019-04-03 DIAGNOSIS — L97521 Non-pressure chronic ulcer of other part of left foot limited to breakdown of skin: Secondary | ICD-10-CM | POA: Diagnosis not present

## 2019-04-03 DIAGNOSIS — I739 Peripheral vascular disease, unspecified: Secondary | ICD-10-CM | POA: Diagnosis not present

## 2019-04-03 DIAGNOSIS — N1831 Chronic kidney disease, stage 3a: Secondary | ICD-10-CM | POA: Diagnosis not present

## 2019-04-03 DIAGNOSIS — E1142 Type 2 diabetes mellitus with diabetic polyneuropathy: Secondary | ICD-10-CM | POA: Diagnosis not present

## 2019-04-03 DIAGNOSIS — J449 Chronic obstructive pulmonary disease, unspecified: Secondary | ICD-10-CM | POA: Diagnosis not present

## 2019-04-03 DIAGNOSIS — I130 Hypertensive heart and chronic kidney disease with heart failure and stage 1 through stage 4 chronic kidney disease, or unspecified chronic kidney disease: Secondary | ICD-10-CM | POA: Diagnosis not present

## 2019-04-03 DIAGNOSIS — L97312 Non-pressure chronic ulcer of right ankle with fat layer exposed: Secondary | ICD-10-CM | POA: Diagnosis not present

## 2019-04-03 DIAGNOSIS — L97919 Non-pressure chronic ulcer of unspecified part of right lower leg with unspecified severity: Secondary | ICD-10-CM | POA: Diagnosis not present

## 2019-04-03 DIAGNOSIS — I509 Heart failure, unspecified: Secondary | ICD-10-CM | POA: Diagnosis not present

## 2019-04-08 DIAGNOSIS — I429 Cardiomyopathy, unspecified: Secondary | ICD-10-CM | POA: Diagnosis not present

## 2019-04-08 DIAGNOSIS — I129 Hypertensive chronic kidney disease with stage 1 through stage 4 chronic kidney disease, or unspecified chronic kidney disease: Secondary | ICD-10-CM | POA: Diagnosis not present

## 2019-04-08 DIAGNOSIS — I4891 Unspecified atrial fibrillation: Secondary | ICD-10-CM | POA: Diagnosis not present

## 2019-04-08 DIAGNOSIS — J42 Unspecified chronic bronchitis: Secondary | ICD-10-CM | POA: Diagnosis not present

## 2019-04-08 DIAGNOSIS — I700 Atherosclerosis of aorta: Secondary | ICD-10-CM | POA: Diagnosis not present

## 2019-04-08 DIAGNOSIS — L97301 Non-pressure chronic ulcer of unspecified ankle limited to breakdown of skin: Secondary | ICD-10-CM | POA: Diagnosis not present

## 2019-04-08 DIAGNOSIS — R739 Hyperglycemia, unspecified: Secondary | ICD-10-CM | POA: Diagnosis not present

## 2019-04-08 DIAGNOSIS — Z6841 Body Mass Index (BMI) 40.0 and over, adult: Secondary | ICD-10-CM | POA: Diagnosis not present

## 2019-04-08 DIAGNOSIS — L97319 Non-pressure chronic ulcer of right ankle with unspecified severity: Secondary | ICD-10-CM | POA: Diagnosis not present

## 2019-04-08 DIAGNOSIS — N183 Chronic kidney disease, stage 3 unspecified: Secondary | ICD-10-CM | POA: Diagnosis not present

## 2019-04-09 ENCOUNTER — Other Ambulatory Visit: Payer: Self-pay

## 2019-04-09 ENCOUNTER — Ambulatory Visit (INDEPENDENT_AMBULATORY_CARE_PROVIDER_SITE_OTHER): Payer: PPO | Admitting: Vascular Surgery

## 2019-04-09 ENCOUNTER — Other Ambulatory Visit (INDEPENDENT_AMBULATORY_CARE_PROVIDER_SITE_OTHER): Payer: Self-pay | Admitting: Vascular Surgery

## 2019-04-09 ENCOUNTER — Ambulatory Visit (INDEPENDENT_AMBULATORY_CARE_PROVIDER_SITE_OTHER): Payer: PPO

## 2019-04-09 ENCOUNTER — Encounter (INDEPENDENT_AMBULATORY_CARE_PROVIDER_SITE_OTHER): Payer: Self-pay | Admitting: Vascular Surgery

## 2019-04-09 DIAGNOSIS — L97909 Non-pressure chronic ulcer of unspecified part of unspecified lower leg with unspecified severity: Secondary | ICD-10-CM

## 2019-04-09 DIAGNOSIS — I4891 Unspecified atrial fibrillation: Secondary | ICD-10-CM | POA: Insufficient documentation

## 2019-04-09 DIAGNOSIS — I10 Essential (primary) hypertension: Secondary | ICD-10-CM | POA: Insufficient documentation

## 2019-04-09 DIAGNOSIS — I872 Venous insufficiency (chronic) (peripheral): Secondary | ICD-10-CM | POA: Diagnosis not present

## 2019-04-09 DIAGNOSIS — I739 Peripheral vascular disease, unspecified: Secondary | ICD-10-CM | POA: Diagnosis not present

## 2019-04-09 DIAGNOSIS — K220 Achalasia of cardia: Secondary | ICD-10-CM | POA: Diagnosis not present

## 2019-04-09 DIAGNOSIS — R0600 Dyspnea, unspecified: Secondary | ICD-10-CM | POA: Diagnosis not present

## 2019-04-09 DIAGNOSIS — J432 Centrilobular emphysema: Secondary | ICD-10-CM | POA: Diagnosis not present

## 2019-04-09 DIAGNOSIS — L97311 Non-pressure chronic ulcer of right ankle limited to breakdown of skin: Secondary | ICD-10-CM

## 2019-04-09 DIAGNOSIS — G4733 Obstructive sleep apnea (adult) (pediatric): Secondary | ICD-10-CM | POA: Diagnosis not present

## 2019-04-09 NOTE — Patient Instructions (Signed)
Venous Ulcer A venous ulcer is a shallow sore on your lower leg that is caused by poor circulation in your veins. This condition used to be called stasis ulcer. Venous ulcer is the most common type of lower leg ulcer. You may have venous ulcers on one leg or on both legs. The area where this condition most commonly develops is around the ankles. A venous ulcer may last for a long time (chronic ulcer) or it may return repeatedly (recurrent ulcer). What are the causes? A venous ulcer may be caused by any condition that causes poor blood flow in your legs. Veins have valves that help return blood to the heart. If these valves do not work properly:  Blood can flow backward and pool in the lower legs.  Blood can then leak out of your veins, which can irritate your skin.  Irritation can cause a break in the skin, which becomes a venous ulcer. What increases the risk? You are more likely to develop this condition if you:  Are 65 years of age or older.  Are male.  Are overweight.  Are not active.  Have had a leg ulcer in the past.  Have varicose veins.  Have clots in your lower leg veins (deep vein thrombosis).  Have inflammation of your leg veins (phlebitis).  Have recently had a pregnancy.  Use products that contain nicotine or tobacco. What are the signs or symptoms? The main symptom of this condition is an open sore near your ankle. Other symptoms may include:  Swelling.  Thickening of the skin.  Fluid leaking from the ulcer.  Bleeding.  Itching.  Pain and swelling that gets worse when you stand up and feels better when you raise your leg.  Blotchy skin.  Darkening of the skin. How is this diagnosed? Your health care provider may suspect a venous ulcer based on your medical history and your risk factors. He or she may:  Do a physical exam.  Do other tests, such as: ? Measuring blood pressure in your arms and legs. ? Using sound waves (ultrasound) to measure  blood flow in your leg veins. How is this treated? This condition may be treated by:  Keeping your leg raised (elevated).  Wearing a type of bandage or stocking to compress the veins of your leg (compression therapy).  Taking medicines to improve blood flow.  Taking antibiotic medicines to treat infection.  Cleaning your ulcer and removing any dead tissue from the wound (debridement).  Placing various types of medicated bandages (dressings) or medicated wraps on your ulcer.  Surgery to close the wound using a piece of skin taken from another area of your body (graft). This is only done for wounds that are deep or hard to heal. You may need to try several different types of treatment to get your venous ulcer to heal. Healing may take a long time. Follow these instructions at home: Medicines  Take or apply over-the-counter and prescription medicines only as told by your health care provider.  If you were prescribed an antibiotic medicine, take it as told by your health care provider. Do not stop using the antibiotic even if you start to feel better.  Ask your health care provider if you should take aspirin before long trips. Wound care  Follow instructions from your health care provider about how to take care of your wound. Make sure you: ? Wash your hands with soap and water before and after you change your bandage (dressing). If soap and   water are not available, use hand sanitizer. ? Change your dressing as told by your health care provider. ? If you had a skin graft, leave stitches (sutures) in place. These may need to stay in place for 2 weeks or longer. ? Ask when you should remove your dressing. If your dressing is dry and sticks to your leg when you try to remove it, moisten or wet the dressing with saline solution or water so that the dressing can be removed without harming your skin or wound tissue.  When you are able to remove your dressing, check your wound every day for  signs of infection. Have a caregiver do this for you if you are not able to do it yourself. Check for: ? More redness, swelling, or pain. ? More fluid or blood. ? Warmth. ? Pus or a bad smell. Activity  Avoid sitting for a long time without moving. Get up to take short walks every 1-2 hours. This is important to improve blood flow in your legs. Ask for help if you feel weak or unsteady.  Ask your health care provider what level of activity is safe for you.  Rest with your legs raised (elevated) during the day. If possible, elevate your legs above the level of your heart for 30 minutes, 3-4 times a day, or as told by your health care provider.  Do not sit with your legs crossed. General instructions   Wear elastic stockings, compression stockings, or support hose as told by your health care provider.  Raise the foot of your bed as told by your health care provider.  Do not use any products that contain nicotine or tobacco, such as cigarettes, e-cigarettes, and chewing tobacco. If you need help quitting, ask your health care provider.  Keep all follow-up visits as told by your health care provider. This is important. Contact a health care provider if:  Your ulcer is getting larger or is not healing.  Your pain gets worse. Get help right away if you have:  More redness, swelling, or pain around your ulcer.  More fluid or blood coming from your ulcer.  Warmth in the area around your ulcer.  Pus or a bad smell coming from your ulcer.  A fever. Summary  A venous ulcer is a shallow sore on your lower leg that is caused by poor circulation in your veins.  Follow instructions from your health care provider about how to take care of your wound.  Check your wound every day for signs of infection.  Take over-the-counter and prescription medicines only as told by your health care provider.  Keep all follow-up visits as told by your health care provider. This is important. This  information is not intended to replace advice given to you by your health care provider. Make sure you discuss any questions you have with your health care provider. Document Revised: 09/28/2017 Document Reviewed: 09/28/2017 Elsevier Patient Education  2020 [employer]  

## 2019-04-09 NOTE — Progress Notes (Signed)
Patient ID: Daniel Eaton., male   DOB: [birth year], 79 y.o.   MRN: [id number]  Chief Complaint  Patient presents with  . Follow-up    ultrasound follow up     HPI Daniel Eaton. is a 79 y.o. male.  I am asked to see the patient by Dr. Cleda Mccreedy for evaluation of a nonhealing ulceration on the right lateral ankle.  This likely happened from a minor trauma to the area and has been very slow to heal.  He has had bad venous stasis to both lower extremities although the legs are not particularly swollen for many years.  Apparently, about 5 years ago he had a venous assessment that was unrevealing.  He has not been seen in our office since at least 2017 dating back to our current computer record.  This ulceration is not particularly painful.  He does have serous drainage but no erythema or purulent drainage.  It is mildly tender to the touch.  With his multiple medical comorbidities, concern for peripheral arterial disease was present and appropriately his podiatrist ordered ABIs that were done today.  Somewhat surprisingly, these demonstrated completely normal triphasic waveforms and ABIs of 1.18 on the right and 1.25 on the left.     Past Medical History:  Diagnosis Date  . A-fib ([place])   . Cellulitis   . CHF (congestive heart failure) ([place])   . COPD (chronic obstructive pulmonary disease) ([place])   . Difficult intubation   . Hyperlipidemia   . Hypertension   . Hypothyroidism   . Kidney disease   . Sleep apnea   . Stroke ([place])   . Thyroid disease     Past Surgical History:  Procedure Laterality Date  . CARDIAC CATHETERIZATION    . CATARACT EXTRACTION W/ INTRAOCULAR LENS  IMPLANT, BILATERAL    . COLONOSCOPY  2008  . HERNIA REPAIR     bilateral inguinal hernia/ [place]  . HERNIA REPAIR  02/02/2015   18 x 28 cm ventral light mesh placed laparoscopically.  . TONSILLECTOMY    . UMBILICAL HERNIA REPAIR N/A 02/02/2015   Procedure: HERNIA REPAIR UMBILICAL ADULT;  Surgeon: Robert Bellow, MD;  Location: [facility] ORS;  Service: General;  Laterality: N/A;  . VENTRAL HERNIA REPAIR N/A 02/02/2015   Procedure: LAPAROSCOPIC VENTRAL HERNIA;  Surgeon: Robert Bellow, MD;  Location: [facility] ORS;  Service: General;  Laterality: N/A;  . vp shunt placement  1979     Family History  Problem Relation Age of Onset  . Heart disease Mother   . Alcohol abuse Father   No bleeding or clotting disorders   Social History   Tobacco Use  . Smoking status: Former Research scientist (life sciences)  . Smokeless tobacco: Never Used  Substance Use Topics  . Alcohol use: No  . Drug use: No     Allergies  Allergen Reactions  . Diltiazem Hcl Itching and Other (See Comments)    edema    Current Outpatient Medications  Medication Sig Dispense Refill  . amiodarone (PACERONE) 200 MG tablet Take 200 mg by mouth daily.     . Fluticasone-Umeclidin-Vilant (TRELEGY ELLIPTA) 100-62.5-25 MCG/INH AEPB Inhale 1 puff into the lungs daily.    Marland Kitchen levothyroxine (SYNTHROID) 88 MCG tablet Take 88 mcg by mouth daily.     Marland Kitchen loratadine (CLARITIN) 10 MG tablet Take 10 mg by mouth daily.    . metolazone (ZAROXOLYN) 2.5 MG tablet Take 2.5 mg by mouth 2 (two) times a week. Monday  and Friday    . metoprolol (LOPRESSOR) 100 MG tablet Take 100 mg by mouth 2 (two) times daily.     . Multiple Vitamin (MULTI-VITAMINS) TABS Take 1 tablet by mouth daily.     . Omega-3 Fatty Acids (OMEGA 3 PO) Take 2 capsules by mouth daily.    Marland Kitchen omeprazole (PRILOSEC) 40 MG capsule Take 40 mg by mouth daily.     . polyethylene glycol (MIRALAX / GLYCOLAX) 17 g packet Take 17 g by mouth daily as needed. 14 each 0  . potassium chloride (KLOR-CON) 10 MEQ tablet Take 1 tablet (10 mEq total) by mouth 3 (three) times daily. 90 tablet 0  . potassium chloride (MICRO-K) 10 MEQ CR capsule TAKE 2 CAPSULES (20 MEQ TOTAL) BY MOUTH 3 (THREE) TIMES DAILY    . rivaroxaban (XARELTO) 20 MG TABS tablet Take 1 tablet (20 mg total) by mouth daily with supper. 30 tablet 0  .  spironolactone (ALDACTONE) 25 MG tablet Take 1 tablet (25 mg total) by mouth daily. 30 tablet 0  . torsemide (DEMADEX) 20 MG tablet Take 1 tablet (20 mg total) by mouth daily. 30 tablet 0   No current facility-administered medications for this visit.      REVIEW OF SYSTEMS (Negative unless checked)  Constitutional: [] Weight loss  [] Fever  [] Chills Cardiac: [] Chest pain   [] Chest pressure   [x] Palpitations   [] Shortness of breath when laying flat   [] Shortness of breath at rest   [x] Shortness of breath with exertion. Vascular:  [] Pain in legs with walking   [] Pain in legs at rest   [] Pain in legs when laying flat   [] Claudication   [] Pain in feet when walking  [] Pain in feet at rest  [] Pain in feet when laying flat   [] History of DVT   [] Phlebitis   [x] Swelling in legs   [] Varicose veins   [x] Non-healing ulcers Pulmonary:   [] Uses home oxygen   [] Productive cough   [] Hemoptysis   [] Wheeze  [x] COPD   [] Asthma Neurologic:  [] Dizziness  [] Blackouts   [] Seizures   [x] History of stroke   [] History of TIA  [] Aphasia   [] Temporary blindness   [] Dysphagia   [] Weakness or numbness in arms   [x] Weakness or numbness in legs Musculoskeletal:  [x] Arthritis   [] Joint swelling   [x] Joint pain   [] Low back pain Hematologic:  [] Easy bruising  [] Easy bleeding   [] Hypercoagulable state   [] Anemic  [] Hepatitis Gastrointestinal:  [] Blood in stool   [] Vomiting blood  [] Gastroesophageal reflux/heartburn   [] Abdominal pain Genitourinary:  [] Chronic kidney disease   [] Difficult urination  [] Frequent urination  [] Burning with urination   [] Hematuria Skin:  [] Rashes   [] Ulcers   [] Wounds Psychological:  [] History of anxiety   []  History of major depression.    Physical Exam BP 136/85 (BP Location: Right Arm)   Pulse 82   Resp 16   Ht 5\' 11"  (1.803 m)   Wt 277 lb (125.6 kg)   BMI 38.63 kg/m  Gen:  WD/WN, NAD Head: [place]/AT, No temporalis wasting Ear/Nose/Throat: Hearing grossly intact, nares w/o erythema or  drainage, oropharynx w/o Erythema/Exudate Eyes: Conjunctiva clear, sclera non-icteric  Neck: trachea midline.  No JVD.  Pulmonary:  Good air movement, respirations not labored, no use of accessory muscles  Cardiac: Mostly regular Vascular:  Vessel Right Left  Radial Palpable Palpable                          PT  not palpable  not palpable  DP  1+  1+   Gastrointestinal:. No masses, surgical incisions, or scars. Musculoskeletal: Diffuse lower extremity weakness.  In a wheelchair.  Fairly severe stasis dermatitis changes are present throughout both lower legs with only mild swelling.  There is a less than 1 cm circular ulceration on the lateral malleolus on the right ankle with fibrinous exudate present but no significant surrounding erythema or drainage Neurologic: Sensation grossly intact in extremities.  Symmetrical.  Speech is fluent. Motor exam as listed above. Psychiatric: Judgment intact, Mood & affect appropriate for pt's clinical situation. Dermatologic: Right lateral ankle ulceration as above    Radiology VAS [place] ABI WITH/WO TBI  Result Date: 04/09/2019 LOWER EXTREMITY DOPPLER STUDY Indications: Ulceration.  Performing Technologist: Blondell Reveal RT, RDMS, RVT  Examination Guidelines: A complete evaluation includes at minimum, Doppler waveform signals and systolic blood pressure reading at the level of bilateral brachial, anterior tibial, and posterior tibial arteries, when vessel segments are accessible. Bilateral testing is considered an integral part of a complete examination. Photoelectric Plethysmograph (PPG) waveforms and toe systolic pressure readings are included as required and additional duplex testing as needed. Limited examinations for reoccurring indications may be performed as noted.  ABI Findings: +--------+------------------+-----+---------+--------+ Right   Rt Pressure (mmHg)IndexWaveform Comment  +--------+------------------+-----+---------+--------+  [id number]                                      +--------+------------------+-----+---------+--------+ ATA     146               1.18 triphasic         +--------+------------------+-----+---------+--------+ PTA     144               1.16 triphasic         +--------+------------------+-----+---------+--------+ +--------+------------------+-----+---------+-------+ Left    Lt Pressure (mmHg)IndexWaveform Comment +--------+------------------+-----+---------+-------+ [id number]                                     +--------+------------------+-----+---------+-------+ ATA     132               1.06 triphasic        +--------+------------------+-----+---------+-------+ PTA     155               1.25 triphasic        +--------+------------------+-----+---------+-------+ TOES Findings: +----------+---------------+---------------+-------+ Right ToesPressure (mmHg)Waveform       Comment +----------+---------------+---------------+-------+ 1st Digit                Mildly dampened        +----------+---------------+---------------+-------+ 2nd Digit                Normal                 +----------+---------------+---------------+-------+ 3rd Digit                Normal                 +----------+---------------+---------------+-------+ 4th Digit                Dampened               +----------+---------------+---------------+-------+ 5th Digit                Mildly dampened        +----------+---------------+---------------+-------+  +---------+---------------+---------------+-------+  Left ToesPressure (mmHg)Waveform       Comment +---------+---------------+---------------+-------+ 1st Digit               Mildly dampened        +---------+---------------+---------------+-------+ 3rd Digit               Mildly dampened        +---------+---------------+---------------+-------+ 4th Digit               Dampened                +---------+---------------+---------------+-------+ 5th Digit               Mildly dampened        +---------+---------------+---------------+-------+ Unable to obtain bilateral toe pressures due to anatominc limitations.  Summary: Right: Resting right ankle-brachial index is within normal range. No evidence of significant right lower extremity arterial disease. Left: Resting left ankle-brachial index is within normal range. No evidence of significant left lower extremity arterial disease.  *See table(s) above for measurements and observations. Electronically signed by Leotis Pain MD on 04/09/2019 at 3:49:05 PM.    Final     Labs No results found for this or any previous visit (from the past 2160 hour(s)).  Assessment/Plan:  Atrial fibrillation (HCC) Rate controlled and on anticoagulation  Essential hypertension blood pressure control important in reducing the progression of atherosclerotic disease. On appropriate oral medications.   Venous stasis dermatitis of both lower extremities Patient has marked stasis changes through both lower extremities.  Even though he reports this was checked several years ago and was okay by his report, with a nonhealing ulceration I think is appropriate to recheck his venous system going forward.  This can be done at his convenience.  Lower limb ulcer, ankle, right, limited to breakdown of skin ([place]) Arterial studies today were normal with no significant arterial disease in either lower extremity.  This is encouraging that he has adequate arterial perfusion for wound healing.  I will perform a venous work-up as this is a typical location of venous stasis ulceration.  This will be done in the near future at his convenience.      Leotis Pain 04/09/2019, 4:16 PM   This note was created with Dragon medical transcription system.  Any errors from dictation are unintentional.

## 2019-04-09 NOTE — Assessment & Plan Note (Signed)
Arterial studies today were normal with no significant arterial disease in either lower extremity.  This is encouraging that he has adequate arterial perfusion for wound healing.  I will perform a venous work-up as this is a typical location of venous stasis ulceration.  This will be done in the near future at his convenience.

## 2019-04-09 NOTE — Assessment & Plan Note (Addendum)
Patient has marked stasis changes through both lower extremities.  Even though he reports this was checked several years ago and was okay by his report, with a nonhealing ulceration I think is appropriate to recheck his venous system going forward.  This can be done at his convenience.

## 2019-04-09 NOTE — Assessment & Plan Note (Signed)
Rate controlled and on anticoagulation. 

## 2019-04-09 NOTE — Assessment & Plan Note (Signed)
blood pressure control important in reducing the progression of atherosclerotic disease. On appropriate oral medications.  

## 2019-04-10 ENCOUNTER — Ambulatory Visit
Admission: RE | Admit: 2019-04-10 | Discharge: 2019-04-10 | Disposition: A | Discharge status: Home / Self Care | Payer: PPO | Source: Ambulatory Visit

## 2019-04-10 NOTE — Discharge Instructions (Signed)
Please follow up in person for evaluation  [facility] at [facility]) [address], [place], [place] [address] [phone]  [facility] [address] [place], [place] [address] [phone]  [facility] at [facility] [address] [place], [place] [address] [phone]  [facility] [place], [place], [place], [place] [address] [phone]  [facility] at [place] [address] [place], [place] [address] [phone]  [facility] at [address], [place], [place] [address] [phone]

## 2019-04-10 NOTE — ED Provider Notes (Signed)
Virtual Visit via Video Note:  Daniel Eaton.  initiated request for Telemedicine visit with [facility] team. I connected with Daniel Eaton.  on 04/10/2019 at 11:12 AM  for a synchronized telemedicine visit using a video enabled HIPPA compliant telemedicine application. I verified that I am speaking with Daniel Eaton.  using two identifiers. Daniel Maret Jodell Cipro, PA-C  was physically located in a [facility] Urgent care site and Daniel Eaton. was located at a different location.   The limitations of evaluation and management by telemedicine as well as the availability of in-person appointments were discussed. Patient was informed that he  may incur a bill ( including co-pay) for this virtual visit encounter. Daniel Eaton.  expressed understanding and gave verbal consent to proceed with virtual visit.     History of Present Illness:Daniel Eaton.  is a 79 y.o. male presents with with few day history of cellulitis left leg. He was seen by vascular yesterday for right ankle wound thought to be caused by venous insufficiency. States left leg was not examined. However, feels that cellulitis is spreading fast, where there is red, warm rash traveling on left lower extremity. He described what sounds like circumferential rash to the thighs.   Past Medical History:  Diagnosis Date  . A-fib ([place])   . Cellulitis   . CHF (congestive heart failure) ([place])   . COPD (chronic obstructive pulmonary disease) ([place])   . Difficult intubation   . Hyperlipidemia   . Hypertension   . Hypothyroidism   . Kidney disease   . Sleep apnea   . Stroke ([place])   . Thyroid disease     Allergies  Allergen Reactions  . Diltiazem Hcl Itching and Other (See Comments)    edema        Observations/Objective: Talked over phone. Speaking in full sentences  Assessment and Plan: Discussed given possible circumferential cellulitis, would like patient to be seen in person as there can be a chance of more  emergent conditions.   Follow Up Instructions:    I discussed the assessment and treatment plan with the patient. The patient was provided an opportunity to ask questions and all were answered. The patient agreed with the plan and demonstrated an understanding of the instructions.   The patient was advised to call back or seek an in-person evaluation if the symptoms worsen or if the condition fails to improve as anticipated.  I provided 5 minutes of non-face-to-face time during this encounter.    Ok Edwards, PA-C  04/10/2019 11:12 AM         Ok Edwards, PA-C 04/10/19 1117

## 2019-04-11 DIAGNOSIS — L03116 Cellulitis of left lower limb: Secondary | ICD-10-CM | POA: Diagnosis not present

## 2019-04-15 DIAGNOSIS — I480 Paroxysmal atrial fibrillation: Secondary | ICD-10-CM | POA: Diagnosis not present

## 2019-04-15 DIAGNOSIS — M6281 Muscle weakness (generalized): Secondary | ICD-10-CM | POA: Diagnosis not present

## 2019-04-15 DIAGNOSIS — I509 Heart failure, unspecified: Secondary | ICD-10-CM | POA: Diagnosis not present

## 2019-04-15 DIAGNOSIS — U071 COVID-19: Secondary | ICD-10-CM | POA: Diagnosis not present

## 2019-04-15 DIAGNOSIS — D72829 Elevated white blood cell count, unspecified: Secondary | ICD-10-CM | POA: Diagnosis not present

## 2019-04-15 DIAGNOSIS — N1831 Chronic kidney disease, stage 3a: Secondary | ICD-10-CM | POA: Diagnosis not present

## 2019-04-15 DIAGNOSIS — I739 Peripheral vascular disease, unspecified: Secondary | ICD-10-CM | POA: Diagnosis not present

## 2019-04-15 DIAGNOSIS — L03115 Cellulitis of right lower limb: Secondary | ICD-10-CM | POA: Diagnosis not present

## 2019-04-15 DIAGNOSIS — J449 Chronic obstructive pulmonary disease, unspecified: Secondary | ICD-10-CM | POA: Diagnosis not present

## 2019-04-15 DIAGNOSIS — L97919 Non-pressure chronic ulcer of unspecified part of right lower leg with unspecified severity: Secondary | ICD-10-CM | POA: Diagnosis not present

## 2019-04-15 DIAGNOSIS — G4733 Obstructive sleep apnea (adult) (pediatric): Secondary | ICD-10-CM | POA: Diagnosis not present

## 2019-04-15 DIAGNOSIS — K219 Gastro-esophageal reflux disease without esophagitis: Secondary | ICD-10-CM | POA: Diagnosis not present

## 2019-04-15 DIAGNOSIS — I130 Hypertensive heart and chronic kidney disease with heart failure and stage 1 through stage 4 chronic kidney disease, or unspecified chronic kidney disease: Secondary | ICD-10-CM | POA: Diagnosis not present

## 2019-05-05 DIAGNOSIS — M6281 Muscle weakness (generalized): Secondary | ICD-10-CM | POA: Diagnosis not present

## 2019-05-05 DIAGNOSIS — J449 Chronic obstructive pulmonary disease, unspecified: Secondary | ICD-10-CM | POA: Diagnosis not present

## 2019-05-05 DIAGNOSIS — L03115 Cellulitis of right lower limb: Secondary | ICD-10-CM | POA: Diagnosis not present

## 2019-05-05 DIAGNOSIS — L97919 Non-pressure chronic ulcer of unspecified part of right lower leg with unspecified severity: Secondary | ICD-10-CM | POA: Diagnosis not present

## 2019-05-05 DIAGNOSIS — N1831 Chronic kidney disease, stage 3a: Secondary | ICD-10-CM | POA: Diagnosis not present

## 2019-05-05 DIAGNOSIS — I739 Peripheral vascular disease, unspecified: Secondary | ICD-10-CM | POA: Diagnosis not present

## 2019-05-05 DIAGNOSIS — U071 COVID-19: Secondary | ICD-10-CM | POA: Diagnosis not present

## 2019-05-05 DIAGNOSIS — I130 Hypertensive heart and chronic kidney disease with heart failure and stage 1 through stage 4 chronic kidney disease, or unspecified chronic kidney disease: Secondary | ICD-10-CM | POA: Diagnosis not present

## 2019-05-05 DIAGNOSIS — D72829 Elevated white blood cell count, unspecified: Secondary | ICD-10-CM | POA: Diagnosis not present

## 2019-05-05 DIAGNOSIS — I509 Heart failure, unspecified: Secondary | ICD-10-CM | POA: Diagnosis not present

## 2019-05-05 DIAGNOSIS — G4733 Obstructive sleep apnea (adult) (pediatric): Secondary | ICD-10-CM | POA: Diagnosis not present

## 2019-05-05 DIAGNOSIS — I480 Paroxysmal atrial fibrillation: Secondary | ICD-10-CM | POA: Diagnosis not present

## 2019-05-05 DIAGNOSIS — K219 Gastro-esophageal reflux disease without esophagitis: Secondary | ICD-10-CM | POA: Diagnosis not present

## 2019-05-16 DIAGNOSIS — I509 Heart failure, unspecified: Secondary | ICD-10-CM | POA: Diagnosis not present

## 2019-05-16 DIAGNOSIS — D72829 Elevated white blood cell count, unspecified: Secondary | ICD-10-CM | POA: Diagnosis not present

## 2019-05-16 DIAGNOSIS — K219 Gastro-esophageal reflux disease without esophagitis: Secondary | ICD-10-CM | POA: Diagnosis not present

## 2019-05-16 DIAGNOSIS — I480 Paroxysmal atrial fibrillation: Secondary | ICD-10-CM | POA: Diagnosis not present

## 2019-05-16 DIAGNOSIS — L97919 Non-pressure chronic ulcer of unspecified part of right lower leg with unspecified severity: Secondary | ICD-10-CM | POA: Diagnosis not present

## 2019-05-16 DIAGNOSIS — N1831 Chronic kidney disease, stage 3a: Secondary | ICD-10-CM | POA: Diagnosis not present

## 2019-05-16 DIAGNOSIS — I130 Hypertensive heart and chronic kidney disease with heart failure and stage 1 through stage 4 chronic kidney disease, or unspecified chronic kidney disease: Secondary | ICD-10-CM | POA: Diagnosis not present

## 2019-05-16 DIAGNOSIS — G4733 Obstructive sleep apnea (adult) (pediatric): Secondary | ICD-10-CM | POA: Diagnosis not present

## 2019-05-16 DIAGNOSIS — U071 COVID-19: Secondary | ICD-10-CM | POA: Diagnosis not present

## 2019-05-16 DIAGNOSIS — J449 Chronic obstructive pulmonary disease, unspecified: Secondary | ICD-10-CM | POA: Diagnosis not present

## 2019-05-16 DIAGNOSIS — M6281 Muscle weakness (generalized): Secondary | ICD-10-CM | POA: Diagnosis not present

## 2019-05-16 DIAGNOSIS — I739 Peripheral vascular disease, unspecified: Secondary | ICD-10-CM | POA: Diagnosis not present

## 2019-05-16 DIAGNOSIS — L03115 Cellulitis of right lower limb: Secondary | ICD-10-CM | POA: Diagnosis not present

## 2019-05-22 DIAGNOSIS — J449 Chronic obstructive pulmonary disease, unspecified: Secondary | ICD-10-CM | POA: Diagnosis not present

## 2019-05-22 DIAGNOSIS — L97919 Non-pressure chronic ulcer of unspecified part of right lower leg with unspecified severity: Secondary | ICD-10-CM | POA: Diagnosis not present

## 2019-05-22 DIAGNOSIS — L03115 Cellulitis of right lower limb: Secondary | ICD-10-CM | POA: Diagnosis not present

## 2019-05-22 DIAGNOSIS — I509 Heart failure, unspecified: Secondary | ICD-10-CM | POA: Diagnosis not present

## 2019-05-22 DIAGNOSIS — I130 Hypertensive heart and chronic kidney disease with heart failure and stage 1 through stage 4 chronic kidney disease, or unspecified chronic kidney disease: Secondary | ICD-10-CM | POA: Diagnosis not present

## 2019-05-22 DIAGNOSIS — I739 Peripheral vascular disease, unspecified: Secondary | ICD-10-CM | POA: Diagnosis not present

## 2019-05-22 DIAGNOSIS — N1831 Chronic kidney disease, stage 3a: Secondary | ICD-10-CM | POA: Diagnosis not present

## 2019-06-15 DIAGNOSIS — I130 Hypertensive heart and chronic kidney disease with heart failure and stage 1 through stage 4 chronic kidney disease, or unspecified chronic kidney disease: Secondary | ICD-10-CM | POA: Diagnosis not present

## 2019-06-15 DIAGNOSIS — I509 Heart failure, unspecified: Secondary | ICD-10-CM | POA: Diagnosis not present

## 2019-06-15 DIAGNOSIS — G4733 Obstructive sleep apnea (adult) (pediatric): Secondary | ICD-10-CM | POA: Diagnosis not present

## 2019-06-15 DIAGNOSIS — L97919 Non-pressure chronic ulcer of unspecified part of right lower leg with unspecified severity: Secondary | ICD-10-CM | POA: Diagnosis not present

## 2019-06-15 DIAGNOSIS — L03115 Cellulitis of right lower limb: Secondary | ICD-10-CM | POA: Diagnosis not present

## 2019-06-15 DIAGNOSIS — I480 Paroxysmal atrial fibrillation: Secondary | ICD-10-CM | POA: Diagnosis not present

## 2019-06-15 DIAGNOSIS — N1831 Chronic kidney disease, stage 3a: Secondary | ICD-10-CM | POA: Diagnosis not present

## 2019-06-15 DIAGNOSIS — J449 Chronic obstructive pulmonary disease, unspecified: Secondary | ICD-10-CM | POA: Diagnosis not present

## 2019-06-15 DIAGNOSIS — I739 Peripheral vascular disease, unspecified: Secondary | ICD-10-CM | POA: Diagnosis not present

## 2019-06-15 DIAGNOSIS — D72829 Elevated white blood cell count, unspecified: Secondary | ICD-10-CM | POA: Diagnosis not present

## 2019-06-15 DIAGNOSIS — K219 Gastro-esophageal reflux disease without esophagitis: Secondary | ICD-10-CM | POA: Diagnosis not present

## 2019-06-15 DIAGNOSIS — U071 COVID-19: Secondary | ICD-10-CM | POA: Diagnosis not present

## 2019-06-15 DIAGNOSIS — M6281 Muscle weakness (generalized): Secondary | ICD-10-CM | POA: Diagnosis not present

## 2019-06-20 DIAGNOSIS — L97521 Non-pressure chronic ulcer of other part of left foot limited to breakdown of skin: Secondary | ICD-10-CM | POA: Diagnosis not present

## 2019-06-20 DIAGNOSIS — M79674 Pain in right toe(s): Secondary | ICD-10-CM | POA: Diagnosis not present

## 2019-06-20 DIAGNOSIS — M79675 Pain in left toe(s): Secondary | ICD-10-CM | POA: Diagnosis not present

## 2019-06-20 DIAGNOSIS — B351 Tinea unguium: Secondary | ICD-10-CM | POA: Diagnosis not present

## 2019-06-27 DIAGNOSIS — G4733 Obstructive sleep apnea (adult) (pediatric): Secondary | ICD-10-CM | POA: Diagnosis not present

## 2019-06-27 DIAGNOSIS — J449 Chronic obstructive pulmonary disease, unspecified: Secondary | ICD-10-CM | POA: Diagnosis not present

## 2019-07-01 DIAGNOSIS — R6889 Other general symptoms and signs: Secondary | ICD-10-CM | POA: Diagnosis not present

## 2019-07-01 DIAGNOSIS — R2689 Other abnormalities of gait and mobility: Secondary | ICD-10-CM | POA: Diagnosis not present

## 2019-07-10 DIAGNOSIS — R2689 Other abnormalities of gait and mobility: Secondary | ICD-10-CM | POA: Diagnosis not present

## 2019-07-10 DIAGNOSIS — R6889 Other general symptoms and signs: Secondary | ICD-10-CM | POA: Diagnosis not present

## 2019-07-12 DIAGNOSIS — R2689 Other abnormalities of gait and mobility: Secondary | ICD-10-CM | POA: Diagnosis not present

## 2019-07-12 DIAGNOSIS — R6889 Other general symptoms and signs: Secondary | ICD-10-CM | POA: Diagnosis not present

## 2019-07-31 DIAGNOSIS — N183 Chronic kidney disease, stage 3 unspecified: Secondary | ICD-10-CM | POA: Diagnosis not present

## 2019-07-31 DIAGNOSIS — R7309 Other abnormal glucose: Secondary | ICD-10-CM | POA: Diagnosis not present

## 2019-07-31 DIAGNOSIS — I129 Hypertensive chronic kidney disease with stage 1 through stage 4 chronic kidney disease, or unspecified chronic kidney disease: Secondary | ICD-10-CM | POA: Diagnosis not present

## 2019-08-01 DIAGNOSIS — R6889 Other general symptoms and signs: Secondary | ICD-10-CM | POA: Diagnosis not present

## 2019-08-01 DIAGNOSIS — R2689 Other abnormalities of gait and mobility: Secondary | ICD-10-CM | POA: Diagnosis not present

## 2019-08-06 DIAGNOSIS — G4733 Obstructive sleep apnea (adult) (pediatric): Secondary | ICD-10-CM | POA: Diagnosis not present

## 2019-08-06 DIAGNOSIS — N183 Chronic kidney disease, stage 3 unspecified: Secondary | ICD-10-CM | POA: Diagnosis not present

## 2019-08-06 DIAGNOSIS — J42 Unspecified chronic bronchitis: Secondary | ICD-10-CM | POA: Diagnosis not present

## 2019-08-06 DIAGNOSIS — R2689 Other abnormalities of gait and mobility: Secondary | ICD-10-CM | POA: Diagnosis not present

## 2019-08-06 DIAGNOSIS — Z6841 Body Mass Index (BMI) 40.0 and over, adult: Secondary | ICD-10-CM | POA: Diagnosis not present

## 2019-08-06 DIAGNOSIS — R739 Hyperglycemia, unspecified: Secondary | ICD-10-CM | POA: Diagnosis not present

## 2019-08-06 DIAGNOSIS — E039 Hypothyroidism, unspecified: Secondary | ICD-10-CM | POA: Diagnosis not present

## 2019-08-06 DIAGNOSIS — I700 Atherosclerosis of aorta: Secondary | ICD-10-CM | POA: Diagnosis not present

## 2019-08-06 DIAGNOSIS — I429 Cardiomyopathy, unspecified: Secondary | ICD-10-CM | POA: Diagnosis not present

## 2019-08-06 DIAGNOSIS — I4891 Unspecified atrial fibrillation: Secondary | ICD-10-CM | POA: Diagnosis not present

## 2019-08-06 DIAGNOSIS — R6889 Other general symptoms and signs: Secondary | ICD-10-CM | POA: Diagnosis not present

## 2019-08-06 DIAGNOSIS — I129 Hypertensive chronic kidney disease with stage 1 through stage 4 chronic kidney disease, or unspecified chronic kidney disease: Secondary | ICD-10-CM | POA: Diagnosis not present

## 2019-08-08 DIAGNOSIS — R2689 Other abnormalities of gait and mobility: Secondary | ICD-10-CM | POA: Diagnosis not present

## 2019-08-08 DIAGNOSIS — R6889 Other general symptoms and signs: Secondary | ICD-10-CM | POA: Diagnosis not present

## 2019-08-26 DIAGNOSIS — R6889 Other general symptoms and signs: Secondary | ICD-10-CM | POA: Diagnosis not present

## 2019-08-26 DIAGNOSIS — R2689 Other abnormalities of gait and mobility: Secondary | ICD-10-CM | POA: Diagnosis not present

## 2019-08-29 DIAGNOSIS — L97521 Non-pressure chronic ulcer of other part of left foot limited to breakdown of skin: Secondary | ICD-10-CM | POA: Diagnosis not present

## 2019-08-29 DIAGNOSIS — L97511 Non-pressure chronic ulcer of other part of right foot limited to breakdown of skin: Secondary | ICD-10-CM | POA: Diagnosis not present

## 2019-08-29 DIAGNOSIS — B351 Tinea unguium: Secondary | ICD-10-CM | POA: Diagnosis not present

## 2019-08-29 DIAGNOSIS — E1142 Type 2 diabetes mellitus with diabetic polyneuropathy: Secondary | ICD-10-CM | POA: Diagnosis not present

## 2019-08-30 DIAGNOSIS — R2689 Other abnormalities of gait and mobility: Secondary | ICD-10-CM | POA: Diagnosis not present

## 2019-08-30 DIAGNOSIS — R6889 Other general symptoms and signs: Secondary | ICD-10-CM | POA: Diagnosis not present

## 2019-09-05 DIAGNOSIS — R2689 Other abnormalities of gait and mobility: Secondary | ICD-10-CM | POA: Diagnosis not present

## 2019-09-05 DIAGNOSIS — R6889 Other general symptoms and signs: Secondary | ICD-10-CM | POA: Diagnosis not present

## 2019-09-09 DIAGNOSIS — R6889 Other general symptoms and signs: Secondary | ICD-10-CM | POA: Diagnosis not present

## 2019-09-09 DIAGNOSIS — R2689 Other abnormalities of gait and mobility: Secondary | ICD-10-CM | POA: Diagnosis not present

## 2019-09-17 DIAGNOSIS — R2689 Other abnormalities of gait and mobility: Secondary | ICD-10-CM | POA: Diagnosis not present

## 2019-09-17 DIAGNOSIS — R6889 Other general symptoms and signs: Secondary | ICD-10-CM | POA: Diagnosis not present

## 2019-09-19 DIAGNOSIS — R2689 Other abnormalities of gait and mobility: Secondary | ICD-10-CM | POA: Diagnosis not present

## 2019-09-19 DIAGNOSIS — R6889 Other general symptoms and signs: Secondary | ICD-10-CM | POA: Diagnosis not present

## 2019-09-26 DIAGNOSIS — G4733 Obstructive sleep apnea (adult) (pediatric): Secondary | ICD-10-CM | POA: Diagnosis not present

## 2019-09-26 DIAGNOSIS — J449 Chronic obstructive pulmonary disease, unspecified: Secondary | ICD-10-CM | POA: Diagnosis not present

## 2019-09-27 DIAGNOSIS — R6889 Other general symptoms and signs: Secondary | ICD-10-CM | POA: Diagnosis not present

## 2019-09-27 DIAGNOSIS — R2689 Other abnormalities of gait and mobility: Secondary | ICD-10-CM | POA: Diagnosis not present

## 2019-10-02 DIAGNOSIS — R2689 Other abnormalities of gait and mobility: Secondary | ICD-10-CM | POA: Diagnosis not present

## 2019-10-02 DIAGNOSIS — R6889 Other general symptoms and signs: Secondary | ICD-10-CM | POA: Diagnosis not present

## 2019-10-03 DIAGNOSIS — H43813 Vitreous degeneration, bilateral: Secondary | ICD-10-CM | POA: Diagnosis not present

## 2019-10-03 DIAGNOSIS — H52223 Regular astigmatism, bilateral: Secondary | ICD-10-CM | POA: Diagnosis not present

## 2019-10-03 DIAGNOSIS — H26492 Other secondary cataract, left eye: Secondary | ICD-10-CM | POA: Diagnosis not present

## 2019-10-03 DIAGNOSIS — H5203 Hypermetropia, bilateral: Secondary | ICD-10-CM | POA: Diagnosis not present

## 2019-10-03 DIAGNOSIS — Z961 Presence of intraocular lens: Secondary | ICD-10-CM | POA: Diagnosis not present

## 2019-10-03 DIAGNOSIS — H40012 Open angle with borderline findings, low risk, left eye: Secondary | ICD-10-CM | POA: Diagnosis not present

## 2019-10-07 DIAGNOSIS — L97511 Non-pressure chronic ulcer of other part of right foot limited to breakdown of skin: Secondary | ICD-10-CM | POA: Diagnosis not present

## 2019-10-07 DIAGNOSIS — L97521 Non-pressure chronic ulcer of other part of left foot limited to breakdown of skin: Secondary | ICD-10-CM | POA: Diagnosis not present

## 2019-10-07 DIAGNOSIS — L97512 Non-pressure chronic ulcer of other part of right foot with fat layer exposed: Secondary | ICD-10-CM | POA: Diagnosis not present

## 2019-10-07 DIAGNOSIS — E1142 Type 2 diabetes mellitus with diabetic polyneuropathy: Secondary | ICD-10-CM | POA: Diagnosis not present

## 2019-10-11 DIAGNOSIS — R6889 Other general symptoms and signs: Secondary | ICD-10-CM | POA: Diagnosis not present

## 2019-10-11 DIAGNOSIS — R2689 Other abnormalities of gait and mobility: Secondary | ICD-10-CM | POA: Diagnosis not present

## 2019-10-14 DIAGNOSIS — L97512 Non-pressure chronic ulcer of other part of right foot with fat layer exposed: Secondary | ICD-10-CM | POA: Diagnosis not present

## 2019-10-15 DIAGNOSIS — R6889 Other general symptoms and signs: Secondary | ICD-10-CM | POA: Diagnosis not present

## 2019-10-15 DIAGNOSIS — R2689 Other abnormalities of gait and mobility: Secondary | ICD-10-CM | POA: Diagnosis not present

## 2019-10-16 DIAGNOSIS — I700 Atherosclerosis of aorta: Secondary | ICD-10-CM | POA: Diagnosis not present

## 2019-10-16 DIAGNOSIS — J42 Unspecified chronic bronchitis: Secondary | ICD-10-CM | POA: Diagnosis not present

## 2019-10-16 DIAGNOSIS — G4733 Obstructive sleep apnea (adult) (pediatric): Secondary | ICD-10-CM | POA: Diagnosis not present

## 2019-10-16 DIAGNOSIS — I4819 Other persistent atrial fibrillation: Secondary | ICD-10-CM | POA: Diagnosis not present

## 2019-10-16 DIAGNOSIS — I429 Cardiomyopathy, unspecified: Secondary | ICD-10-CM | POA: Diagnosis not present

## 2019-10-16 DIAGNOSIS — I4891 Unspecified atrial fibrillation: Secondary | ICD-10-CM | POA: Diagnosis not present

## 2019-10-18 DIAGNOSIS — R2689 Other abnormalities of gait and mobility: Secondary | ICD-10-CM | POA: Diagnosis not present

## 2019-10-18 DIAGNOSIS — R6889 Other general symptoms and signs: Secondary | ICD-10-CM | POA: Diagnosis not present

## 2019-10-23 ENCOUNTER — Other Ambulatory Visit: Payer: Self-pay | Admitting: Cardiology

## 2019-10-23 DIAGNOSIS — I4891 Unspecified atrial fibrillation: Secondary | ICD-10-CM

## 2019-10-29 DIAGNOSIS — L97512 Non-pressure chronic ulcer of other part of right foot with fat layer exposed: Secondary | ICD-10-CM | POA: Diagnosis not present

## 2019-10-29 DIAGNOSIS — E1142 Type 2 diabetes mellitus with diabetic polyneuropathy: Secondary | ICD-10-CM | POA: Diagnosis not present

## 2019-11-07 ENCOUNTER — Other Ambulatory Visit: Payer: Self-pay

## 2019-11-07 ENCOUNTER — Ambulatory Visit
Admission: RE | Admit: 2019-11-07 | Discharge: 2019-11-07 | Disposition: A | Discharge status: Home / Self Care | Payer: PPO | Source: Ambulatory Visit | Attending: Cardiology | Admitting: Cardiology

## 2019-11-07 DIAGNOSIS — I083 Combined rheumatic disorders of mitral, aortic and tricuspid valves: Secondary | ICD-10-CM | POA: Diagnosis not present

## 2019-11-07 DIAGNOSIS — I110 Hypertensive heart disease with heart failure: Secondary | ICD-10-CM | POA: Insufficient documentation

## 2019-11-07 DIAGNOSIS — I4891 Unspecified atrial fibrillation: Secondary | ICD-10-CM

## 2019-11-07 DIAGNOSIS — J449 Chronic obstructive pulmonary disease, unspecified: Secondary | ICD-10-CM | POA: Insufficient documentation

## 2019-11-07 DIAGNOSIS — Z8673 Personal history of transient ischemic attack (TIA), and cerebral infarction without residual deficits: Secondary | ICD-10-CM | POA: Diagnosis not present

## 2019-11-07 DIAGNOSIS — I509 Heart failure, unspecified: Secondary | ICD-10-CM | POA: Insufficient documentation

## 2019-11-07 LAB — ECHOCARDIOGRAM COMPLETE
AR max vel: 2.04 cm2
AV Area VTI: 2.58 cm2
AV Area mean vel: 2.09 cm2
AV Mean grad: 3.3 mmHg
AV Peak grad: 5.6 mmHg
Ao pk vel: 1.19 m/s
Area-P 1/2: 4.86 cm2
S' Lateral: 2.63 cm

## 2019-11-07 NOTE — Progress Notes (Signed)
*  PRELIMINARY RESULTS* Echocardiogram 2D Echocardiogram has been performed.  Sherrie Sport 11/07/2019, 10:38 AM

## 2019-11-08 DIAGNOSIS — E039 Hypothyroidism, unspecified: Secondary | ICD-10-CM | POA: Diagnosis not present

## 2019-11-08 DIAGNOSIS — I429 Cardiomyopathy, unspecified: Secondary | ICD-10-CM | POA: Diagnosis not present

## 2019-11-08 DIAGNOSIS — M1711 Unilateral primary osteoarthritis, right knee: Secondary | ICD-10-CM | POA: Diagnosis not present

## 2019-11-08 DIAGNOSIS — I130 Hypertensive heart and chronic kidney disease with heart failure and stage 1 through stage 4 chronic kidney disease, or unspecified chronic kidney disease: Secondary | ICD-10-CM | POA: Diagnosis not present

## 2019-11-08 DIAGNOSIS — E1122 Type 2 diabetes mellitus with diabetic chronic kidney disease: Secondary | ICD-10-CM | POA: Diagnosis not present

## 2019-11-08 DIAGNOSIS — E1165 Type 2 diabetes mellitus with hyperglycemia: Secondary | ICD-10-CM | POA: Diagnosis not present

## 2019-11-08 DIAGNOSIS — G4733 Obstructive sleep apnea (adult) (pediatric): Secondary | ICD-10-CM | POA: Diagnosis not present

## 2019-11-08 DIAGNOSIS — N183 Chronic kidney disease, stage 3 unspecified: Secondary | ICD-10-CM | POA: Diagnosis not present

## 2019-11-08 DIAGNOSIS — I509 Heart failure, unspecified: Secondary | ICD-10-CM | POA: Diagnosis not present

## 2019-11-08 DIAGNOSIS — E1142 Type 2 diabetes mellitus with diabetic polyneuropathy: Secondary | ICD-10-CM | POA: Diagnosis not present

## 2019-11-08 DIAGNOSIS — J449 Chronic obstructive pulmonary disease, unspecified: Secondary | ICD-10-CM | POA: Diagnosis not present

## 2019-11-08 DIAGNOSIS — I700 Atherosclerosis of aorta: Secondary | ICD-10-CM | POA: Diagnosis not present

## 2019-11-08 DIAGNOSIS — I480 Paroxysmal atrial fibrillation: Secondary | ICD-10-CM | POA: Diagnosis not present

## 2019-11-15 DIAGNOSIS — I509 Heart failure, unspecified: Secondary | ICD-10-CM | POA: Diagnosis not present

## 2019-11-15 DIAGNOSIS — J449 Chronic obstructive pulmonary disease, unspecified: Secondary | ICD-10-CM | POA: Diagnosis not present

## 2019-11-15 DIAGNOSIS — G4733 Obstructive sleep apnea (adult) (pediatric): Secondary | ICD-10-CM | POA: Diagnosis not present

## 2019-11-15 DIAGNOSIS — E1142 Type 2 diabetes mellitus with diabetic polyneuropathy: Secondary | ICD-10-CM | POA: Diagnosis not present

## 2019-11-15 DIAGNOSIS — I429 Cardiomyopathy, unspecified: Secondary | ICD-10-CM | POA: Diagnosis not present

## 2019-11-15 DIAGNOSIS — E039 Hypothyroidism, unspecified: Secondary | ICD-10-CM | POA: Diagnosis not present

## 2019-11-15 DIAGNOSIS — I700 Atherosclerosis of aorta: Secondary | ICD-10-CM | POA: Diagnosis not present

## 2019-11-15 DIAGNOSIS — E1122 Type 2 diabetes mellitus with diabetic chronic kidney disease: Secondary | ICD-10-CM | POA: Diagnosis not present

## 2019-11-15 DIAGNOSIS — M1711 Unilateral primary osteoarthritis, right knee: Secondary | ICD-10-CM | POA: Diagnosis not present

## 2019-11-15 DIAGNOSIS — N183 Chronic kidney disease, stage 3 unspecified: Secondary | ICD-10-CM | POA: Diagnosis not present

## 2019-11-15 DIAGNOSIS — I480 Paroxysmal atrial fibrillation: Secondary | ICD-10-CM | POA: Diagnosis not present

## 2019-11-15 DIAGNOSIS — E1165 Type 2 diabetes mellitus with hyperglycemia: Secondary | ICD-10-CM | POA: Diagnosis not present

## 2019-11-15 DIAGNOSIS — I130 Hypertensive heart and chronic kidney disease with heart failure and stage 1 through stage 4 chronic kidney disease, or unspecified chronic kidney disease: Secondary | ICD-10-CM | POA: Diagnosis not present

## 2019-11-21 DIAGNOSIS — L03031 Cellulitis of right toe: Secondary | ICD-10-CM | POA: Diagnosis not present

## 2019-11-21 DIAGNOSIS — E1142 Type 2 diabetes mellitus with diabetic polyneuropathy: Secondary | ICD-10-CM | POA: Diagnosis not present

## 2019-11-21 DIAGNOSIS — B351 Tinea unguium: Secondary | ICD-10-CM | POA: Diagnosis not present

## 2019-11-21 DIAGNOSIS — L03032 Cellulitis of left toe: Secondary | ICD-10-CM | POA: Diagnosis not present

## 2019-11-21 DIAGNOSIS — L600 Ingrowing nail: Secondary | ICD-10-CM | POA: Diagnosis not present

## 2019-11-21 DIAGNOSIS — L97521 Non-pressure chronic ulcer of other part of left foot limited to breakdown of skin: Secondary | ICD-10-CM | POA: Diagnosis not present

## 2019-11-21 DIAGNOSIS — L97511 Non-pressure chronic ulcer of other part of right foot limited to breakdown of skin: Secondary | ICD-10-CM | POA: Diagnosis not present

## 2019-11-26 DIAGNOSIS — Z6841 Body Mass Index (BMI) 40.0 and over, adult: Secondary | ICD-10-CM | POA: Diagnosis not present

## 2019-11-26 DIAGNOSIS — G4733 Obstructive sleep apnea (adult) (pediatric): Secondary | ICD-10-CM | POA: Diagnosis not present

## 2019-11-26 DIAGNOSIS — J811 Chronic pulmonary edema: Secondary | ICD-10-CM | POA: Diagnosis not present

## 2019-11-26 DIAGNOSIS — I517 Cardiomegaly: Secondary | ICD-10-CM | POA: Diagnosis not present

## 2019-11-26 DIAGNOSIS — I4891 Unspecified atrial fibrillation: Secondary | ICD-10-CM | POA: Diagnosis not present

## 2019-11-26 DIAGNOSIS — J449 Chronic obstructive pulmonary disease, unspecified: Secondary | ICD-10-CM | POA: Diagnosis not present

## 2019-11-26 DIAGNOSIS — J9811 Atelectasis: Secondary | ICD-10-CM | POA: Diagnosis not present

## 2019-11-26 DIAGNOSIS — R0602 Shortness of breath: Secondary | ICD-10-CM | POA: Diagnosis not present

## 2019-12-11 DIAGNOSIS — I4891 Unspecified atrial fibrillation: Secondary | ICD-10-CM | POA: Diagnosis not present

## 2019-12-11 DIAGNOSIS — N183 Chronic kidney disease, stage 3 unspecified: Secondary | ICD-10-CM | POA: Diagnosis not present

## 2019-12-11 DIAGNOSIS — I129 Hypertensive chronic kidney disease with stage 1 through stage 4 chronic kidney disease, or unspecified chronic kidney disease: Secondary | ICD-10-CM | POA: Diagnosis not present

## 2019-12-11 DIAGNOSIS — E039 Hypothyroidism, unspecified: Secondary | ICD-10-CM | POA: Diagnosis not present

## 2019-12-11 DIAGNOSIS — R739 Hyperglycemia, unspecified: Secondary | ICD-10-CM | POA: Diagnosis not present

## 2019-12-16 DIAGNOSIS — E1142 Type 2 diabetes mellitus with diabetic polyneuropathy: Secondary | ICD-10-CM | POA: Diagnosis not present

## 2019-12-16 DIAGNOSIS — E1165 Type 2 diabetes mellitus with hyperglycemia: Secondary | ICD-10-CM | POA: Diagnosis not present

## 2019-12-16 DIAGNOSIS — E1122 Type 2 diabetes mellitus with diabetic chronic kidney disease: Secondary | ICD-10-CM | POA: Diagnosis not present

## 2019-12-16 DIAGNOSIS — N183 Chronic kidney disease, stage 3 unspecified: Secondary | ICD-10-CM | POA: Diagnosis not present

## 2019-12-16 DIAGNOSIS — I509 Heart failure, unspecified: Secondary | ICD-10-CM | POA: Diagnosis not present

## 2019-12-16 DIAGNOSIS — I480 Paroxysmal atrial fibrillation: Secondary | ICD-10-CM | POA: Diagnosis not present

## 2019-12-16 DIAGNOSIS — I4819 Other persistent atrial fibrillation: Secondary | ICD-10-CM | POA: Diagnosis not present

## 2019-12-16 DIAGNOSIS — J449 Chronic obstructive pulmonary disease, unspecified: Secondary | ICD-10-CM | POA: Diagnosis not present

## 2019-12-16 DIAGNOSIS — I130 Hypertensive heart and chronic kidney disease with heart failure and stage 1 through stage 4 chronic kidney disease, or unspecified chronic kidney disease: Secondary | ICD-10-CM | POA: Diagnosis not present

## 2019-12-16 DIAGNOSIS — I700 Atherosclerosis of aorta: Secondary | ICD-10-CM | POA: Diagnosis not present

## 2019-12-16 DIAGNOSIS — E039 Hypothyroidism, unspecified: Secondary | ICD-10-CM | POA: Diagnosis not present

## 2019-12-16 DIAGNOSIS — M1711 Unilateral primary osteoarthritis, right knee: Secondary | ICD-10-CM | POA: Diagnosis not present

## 2019-12-16 DIAGNOSIS — G4733 Obstructive sleep apnea (adult) (pediatric): Secondary | ICD-10-CM | POA: Diagnosis not present

## 2019-12-16 DIAGNOSIS — I429 Cardiomyopathy, unspecified: Secondary | ICD-10-CM | POA: Diagnosis not present

## 2019-12-17 DIAGNOSIS — I700 Atherosclerosis of aorta: Secondary | ICD-10-CM | POA: Diagnosis not present

## 2019-12-17 DIAGNOSIS — J449 Chronic obstructive pulmonary disease, unspecified: Secondary | ICD-10-CM | POA: Diagnosis not present

## 2019-12-17 DIAGNOSIS — N183 Chronic kidney disease, stage 3 unspecified: Secondary | ICD-10-CM | POA: Diagnosis not present

## 2019-12-17 DIAGNOSIS — I429 Cardiomyopathy, unspecified: Secondary | ICD-10-CM | POA: Diagnosis not present

## 2019-12-17 DIAGNOSIS — I130 Hypertensive heart and chronic kidney disease with heart failure and stage 1 through stage 4 chronic kidney disease, or unspecified chronic kidney disease: Secondary | ICD-10-CM | POA: Diagnosis not present

## 2019-12-17 DIAGNOSIS — E1122 Type 2 diabetes mellitus with diabetic chronic kidney disease: Secondary | ICD-10-CM | POA: Diagnosis not present

## 2019-12-17 DIAGNOSIS — I509 Heart failure, unspecified: Secondary | ICD-10-CM | POA: Diagnosis not present

## 2019-12-17 DIAGNOSIS — I480 Paroxysmal atrial fibrillation: Secondary | ICD-10-CM | POA: Diagnosis not present

## 2019-12-18 DIAGNOSIS — Z23 Encounter for immunization: Secondary | ICD-10-CM | POA: Diagnosis not present

## 2019-12-18 DIAGNOSIS — M791 Myalgia, unspecified site: Secondary | ICD-10-CM | POA: Insufficient documentation

## 2019-12-18 DIAGNOSIS — I129 Hypertensive chronic kidney disease with stage 1 through stage 4 chronic kidney disease, or unspecified chronic kidney disease: Secondary | ICD-10-CM | POA: Diagnosis not present

## 2019-12-18 DIAGNOSIS — Z0000 Encounter for general adult medical examination without abnormal findings: Secondary | ICD-10-CM | POA: Diagnosis not present

## 2019-12-18 DIAGNOSIS — I429 Cardiomyopathy, unspecified: Secondary | ICD-10-CM | POA: Diagnosis not present

## 2019-12-18 DIAGNOSIS — E039 Hypothyroidism, unspecified: Secondary | ICD-10-CM | POA: Diagnosis not present

## 2019-12-18 DIAGNOSIS — R739 Hyperglycemia, unspecified: Secondary | ICD-10-CM | POA: Diagnosis not present

## 2019-12-18 DIAGNOSIS — J42 Unspecified chronic bronchitis: Secondary | ICD-10-CM | POA: Diagnosis not present

## 2019-12-18 DIAGNOSIS — I4819 Other persistent atrial fibrillation: Secondary | ICD-10-CM | POA: Diagnosis not present

## 2019-12-18 DIAGNOSIS — I700 Atherosclerosis of aorta: Secondary | ICD-10-CM | POA: Diagnosis not present

## 2019-12-18 DIAGNOSIS — I10 Essential (primary) hypertension: Secondary | ICD-10-CM | POA: Diagnosis not present

## 2019-12-23 DIAGNOSIS — G4733 Obstructive sleep apnea (adult) (pediatric): Secondary | ICD-10-CM | POA: Diagnosis not present

## 2019-12-23 DIAGNOSIS — J449 Chronic obstructive pulmonary disease, unspecified: Secondary | ICD-10-CM | POA: Diagnosis not present

## 2020-01-01 DIAGNOSIS — G4733 Obstructive sleep apnea (adult) (pediatric): Secondary | ICD-10-CM | POA: Diagnosis not present

## 2020-01-01 DIAGNOSIS — I429 Cardiomyopathy, unspecified: Secondary | ICD-10-CM | POA: Diagnosis not present

## 2020-01-01 DIAGNOSIS — I700 Atherosclerosis of aorta: Secondary | ICD-10-CM | POA: Diagnosis not present

## 2020-01-01 DIAGNOSIS — I10 Essential (primary) hypertension: Secondary | ICD-10-CM | POA: Diagnosis not present

## 2020-01-01 DIAGNOSIS — I129 Hypertensive chronic kidney disease with stage 1 through stage 4 chronic kidney disease, or unspecified chronic kidney disease: Secondary | ICD-10-CM | POA: Diagnosis not present

## 2020-01-01 DIAGNOSIS — J42 Unspecified chronic bronchitis: Secondary | ICD-10-CM | POA: Diagnosis not present

## 2020-01-01 DIAGNOSIS — I4819 Other persistent atrial fibrillation: Secondary | ICD-10-CM | POA: Diagnosis not present

## 2020-01-01 DIAGNOSIS — N183 Chronic kidney disease, stage 3 unspecified: Secondary | ICD-10-CM | POA: Diagnosis not present

## 2020-01-15 DIAGNOSIS — I700 Atherosclerosis of aorta: Secondary | ICD-10-CM | POA: Diagnosis not present

## 2020-01-15 DIAGNOSIS — M1711 Unilateral primary osteoarthritis, right knee: Secondary | ICD-10-CM | POA: Diagnosis not present

## 2020-01-15 DIAGNOSIS — K279 Peptic ulcer, site unspecified, unspecified as acute or chronic, without hemorrhage or perforation: Secondary | ICD-10-CM | POA: Diagnosis not present

## 2020-01-15 DIAGNOSIS — I480 Paroxysmal atrial fibrillation: Secondary | ICD-10-CM | POA: Diagnosis not present

## 2020-01-15 DIAGNOSIS — G4733 Obstructive sleep apnea (adult) (pediatric): Secondary | ICD-10-CM | POA: Diagnosis not present

## 2020-01-15 DIAGNOSIS — J42 Unspecified chronic bronchitis: Secondary | ICD-10-CM | POA: Diagnosis not present

## 2020-01-15 DIAGNOSIS — E039 Hypothyroidism, unspecified: Secondary | ICD-10-CM | POA: Diagnosis not present

## 2020-01-15 DIAGNOSIS — N183 Chronic kidney disease, stage 3 unspecified: Secondary | ICD-10-CM | POA: Diagnosis not present

## 2020-01-15 DIAGNOSIS — I429 Cardiomyopathy, unspecified: Secondary | ICD-10-CM | POA: Diagnosis not present

## 2020-01-15 DIAGNOSIS — I129 Hypertensive chronic kidney disease with stage 1 through stage 4 chronic kidney disease, or unspecified chronic kidney disease: Secondary | ICD-10-CM | POA: Diagnosis not present

## 2020-01-15 DIAGNOSIS — E785 Hyperlipidemia, unspecified: Secondary | ICD-10-CM | POA: Diagnosis not present

## 2020-01-15 DIAGNOSIS — K220 Achalasia of cardia: Secondary | ICD-10-CM | POA: Diagnosis not present

## 2020-01-15 DIAGNOSIS — I872 Venous insufficiency (chronic) (peripheral): Secondary | ICD-10-CM | POA: Diagnosis not present

## 2020-01-20 DIAGNOSIS — G4733 Obstructive sleep apnea (adult) (pediatric): Secondary | ICD-10-CM | POA: Diagnosis not present

## 2020-01-20 DIAGNOSIS — J449 Chronic obstructive pulmonary disease, unspecified: Secondary | ICD-10-CM | POA: Diagnosis not present

## 2020-02-12 DIAGNOSIS — L97511 Non-pressure chronic ulcer of other part of right foot limited to breakdown of skin: Secondary | ICD-10-CM | POA: Diagnosis not present

## 2020-02-12 DIAGNOSIS — E1142 Type 2 diabetes mellitus with diabetic polyneuropathy: Secondary | ICD-10-CM | POA: Diagnosis not present

## 2020-02-12 DIAGNOSIS — L97521 Non-pressure chronic ulcer of other part of left foot limited to breakdown of skin: Secondary | ICD-10-CM | POA: Diagnosis not present

## 2020-02-12 DIAGNOSIS — B351 Tinea unguium: Secondary | ICD-10-CM | POA: Diagnosis not present

## 2020-02-15 DIAGNOSIS — M1711 Unilateral primary osteoarthritis, right knee: Secondary | ICD-10-CM | POA: Diagnosis not present

## 2020-02-15 DIAGNOSIS — I872 Venous insufficiency (chronic) (peripheral): Secondary | ICD-10-CM | POA: Diagnosis not present

## 2020-02-15 DIAGNOSIS — E039 Hypothyroidism, unspecified: Secondary | ICD-10-CM | POA: Diagnosis not present

## 2020-02-15 DIAGNOSIS — G4733 Obstructive sleep apnea (adult) (pediatric): Secondary | ICD-10-CM | POA: Diagnosis not present

## 2020-02-15 DIAGNOSIS — E785 Hyperlipidemia, unspecified: Secondary | ICD-10-CM | POA: Diagnosis not present

## 2020-02-15 DIAGNOSIS — J42 Unspecified chronic bronchitis: Secondary | ICD-10-CM | POA: Diagnosis not present

## 2020-02-15 DIAGNOSIS — I129 Hypertensive chronic kidney disease with stage 1 through stage 4 chronic kidney disease, or unspecified chronic kidney disease: Secondary | ICD-10-CM | POA: Diagnosis not present

## 2020-02-15 DIAGNOSIS — I700 Atherosclerosis of aorta: Secondary | ICD-10-CM | POA: Diagnosis not present

## 2020-02-15 DIAGNOSIS — N183 Chronic kidney disease, stage 3 unspecified: Secondary | ICD-10-CM | POA: Diagnosis not present

## 2020-02-15 DIAGNOSIS — I480 Paroxysmal atrial fibrillation: Secondary | ICD-10-CM | POA: Diagnosis not present

## 2020-02-15 DIAGNOSIS — I429 Cardiomyopathy, unspecified: Secondary | ICD-10-CM | POA: Diagnosis not present

## 2020-02-15 DIAGNOSIS — K279 Peptic ulcer, site unspecified, unspecified as acute or chronic, without hemorrhage or perforation: Secondary | ICD-10-CM | POA: Diagnosis not present

## 2020-02-15 DIAGNOSIS — K220 Achalasia of cardia: Secondary | ICD-10-CM | POA: Diagnosis not present

## 2020-02-26 DIAGNOSIS — J439 Emphysema, unspecified: Secondary | ICD-10-CM | POA: Diagnosis not present

## 2020-02-26 DIAGNOSIS — I255 Ischemic cardiomyopathy: Secondary | ICD-10-CM | POA: Diagnosis not present

## 2020-02-26 DIAGNOSIS — Z6841 Body Mass Index (BMI) 40.0 and over, adult: Secondary | ICD-10-CM | POA: Diagnosis not present

## 2020-02-26 DIAGNOSIS — G4733 Obstructive sleep apnea (adult) (pediatric): Secondary | ICD-10-CM | POA: Diagnosis not present

## 2020-02-26 DIAGNOSIS — R0600 Dyspnea, unspecified: Secondary | ICD-10-CM | POA: Diagnosis not present

## 2020-03-20 DIAGNOSIS — G4733 Obstructive sleep apnea (adult) (pediatric): Secondary | ICD-10-CM | POA: Diagnosis not present

## 2020-03-20 DIAGNOSIS — J449 Chronic obstructive pulmonary disease, unspecified: Secondary | ICD-10-CM | POA: Diagnosis not present

## 2020-04-02 DIAGNOSIS — G4733 Obstructive sleep apnea (adult) (pediatric): Secondary | ICD-10-CM | POA: Diagnosis not present

## 2020-04-02 DIAGNOSIS — I255 Ischemic cardiomyopathy: Secondary | ICD-10-CM | POA: Diagnosis not present

## 2020-04-02 DIAGNOSIS — J42 Unspecified chronic bronchitis: Secondary | ICD-10-CM | POA: Diagnosis not present

## 2020-04-02 DIAGNOSIS — I4819 Other persistent atrial fibrillation: Secondary | ICD-10-CM | POA: Diagnosis not present

## 2020-04-02 DIAGNOSIS — R5381 Other malaise: Secondary | ICD-10-CM | POA: Diagnosis not present

## 2020-04-02 DIAGNOSIS — I10 Essential (primary) hypertension: Secondary | ICD-10-CM | POA: Diagnosis not present

## 2020-04-02 DIAGNOSIS — N183 Chronic kidney disease, stage 3 unspecified: Secondary | ICD-10-CM | POA: Diagnosis not present

## 2020-04-02 DIAGNOSIS — I700 Atherosclerosis of aorta: Secondary | ICD-10-CM | POA: Diagnosis not present

## 2020-04-02 DIAGNOSIS — Z6841 Body Mass Index (BMI) 40.0 and over, adult: Secondary | ICD-10-CM | POA: Diagnosis not present

## 2020-04-02 DIAGNOSIS — I129 Hypertensive chronic kidney disease with stage 1 through stage 4 chronic kidney disease, or unspecified chronic kidney disease: Secondary | ICD-10-CM | POA: Diagnosis not present

## 2020-04-08 DIAGNOSIS — R739 Hyperglycemia, unspecified: Secondary | ICD-10-CM | POA: Diagnosis not present

## 2020-04-08 DIAGNOSIS — I10 Essential (primary) hypertension: Secondary | ICD-10-CM | POA: Diagnosis not present

## 2020-04-08 DIAGNOSIS — E039 Hypothyroidism, unspecified: Secondary | ICD-10-CM | POA: Diagnosis not present

## 2020-04-15 ENCOUNTER — Other Ambulatory Visit: Payer: Self-pay

## 2020-04-15 ENCOUNTER — Ambulatory Visit: Payer: HMO | Attending: Internal Medicine

## 2020-04-15 DIAGNOSIS — R2689 Other abnormalities of gait and mobility: Secondary | ICD-10-CM | POA: Insufficient documentation

## 2020-04-15 DIAGNOSIS — R2681 Unsteadiness on feet: Secondary | ICD-10-CM | POA: Diagnosis not present

## 2020-04-15 DIAGNOSIS — M6281 Muscle weakness (generalized): Secondary | ICD-10-CM | POA: Diagnosis not present

## 2020-04-15 NOTE — Therapy (Signed)
[place] MAIN [facility] SERVICES [address] [place], [place], [address] Phone: [phone]   Fax:  [phone]  Physical Therapy Evaluation  Patient Details  Name: Daniel Eaton. MRN: [id number] Date of Birth: [birth year] Referring Provider (PT): Dr. [name] MD   Encounter Date: 04/15/2020   PT End of Session - 04/15/20 1603    Visit Number 1    Number of Visits 16    Date for PT Re-Evaluation 06/10/20    Authorization Type 1/10    Authorization - Visit Number 1    Authorization - Number of Visits 10    Progress Note Due on Visit 10    PT Start Time 1518    PT Stop Time 1602    PT Time Calculation (min) 44 min    Equipment Utilized During Treatment Gait belt    Activity Tolerance Patient limited by fatigue    Behavior During Therapy [facility] for tasks assessed/performed           Past Medical History:  Diagnosis Date  . A-fib ([place])   . Cellulitis   . CHF (congestive heart failure) ([place])   . COPD (chronic obstructive pulmonary disease) ([place])   . Difficult intubation   . Hyperlipidemia   . Hypertension   . Hypothyroidism   . Kidney disease   . Sleep apnea   . Stroke ([place])   . Thyroid disease     Past Surgical History:  Procedure Laterality Date  . CARDIAC CATHETERIZATION    . CATARACT EXTRACTION W/ INTRAOCULAR LENS  IMPLANT, BILATERAL    . COLONOSCOPY  2008  . HERNIA REPAIR     bilateral inguinal hernia/ [place]  . HERNIA REPAIR  02/02/2015   18 x 28 cm ventral light mesh placed laparoscopically.  . TONSILLECTOMY    . UMBILICAL HERNIA REPAIR N/A 02/02/2015   Procedure: HERNIA REPAIR UMBILICAL ADULT;  Surgeon: Robert Bellow, MD;  Location: [facility] ORS;  Service: General;  Laterality: N/A;  . VENTRAL HERNIA REPAIR N/A 02/02/2015   Procedure: LAPAROSCOPIC VENTRAL HERNIA;  Surgeon: Robert Bellow, MD;  Location: [facility] ORS;  Service: General;  Laterality: N/A;  . vp shunt placement  1979    There were no  vitals filed for this visit.    Subjective Assessment - 04/15/20 1522    Subjective Pt is a 80 y.o. male referred to PT for Physical deconditioning. Pt is known to this clinic and has received rehab here in 2019. Pt has reported a decrease in his activity tolerance and strength and wants to improve his balance, gait, LE strength and capacity for functional mobility.    Pertinent History Pt is a 80 y.o. male referred to PT for Physical deconditioning. Pt is known to this clinic and has received rehab here in 2019. Pt has a significant PMH of COPD, A-fib, Aortic Atherosclerosis, ischemic cardiomyopathy, benign hypertension with chronic kidney disease, stage III (CMS-HCC); Essential hypertension; Chronic bronchitis, unspecified chronic bronchitis type (CMS-HCC); OSA (obstructive sleep apnea); Morbid obesity with BMI of 40.0-44.9, adult (CMS-HCC) per last cardiology visit. Pt reports being unable to being able to participate in PT during his stay at [facility] this previous October and also tested positive for COVID-19. Once home pt received HH PT up until recently. He completed 2 episodes of OP PT prior to returning to [facility] that were not successful which is why the pt is here at [facility]. Pt reports he is able to stand  still for 5 min, can sit for 1 hour comfortably. A fall reported since last October which has put him in his last hospitilzation with daily near falls (1-2x/day) with turning or reaching.  Pt's goal is to focus on gait, balance, LE strength, and walking endurance with RW.    Limitations Standing;House hold activities;Walking;Lifting    How long can you sit comfortably? 1 hour    How long can you stand comfortably? 5 min with RW    How long can you walk comfortably? 15 min    Patient Stated Goals Improve gait, balance, and LE strength. Reduce risk of falls.    Currently in Pain? Other (Comment)   no pain at baseline. 5/10 in L shoulder        Vitals: SPO2: 95%, Resting HR:  104-105 BPM      PAIN: 0/10 NPS in knees but 5/10 NPS in L shoulder.  POSTURE: Seated and standing with forward head lean with kyphosis and rounded shoulders. Heavy reliance with leaning forward in standing on RW.    PROM/AROM:  AROM BUE: Limited L shoulder elevation due to pain with 25% of AROM PROM BLE AROM BLE: WFL, limited by body habitus; slight extension limitations bilaterally as noted via functional mobility.    STRENGTH:  Graded on a 0-5 scale Muscle Group Left Right                          Hip Flex 3+/5 4-/5  Hip Abd 3/5 2+/5  Hip Add 2+/5 3/5  Hip Ext /5 /5  Hip IR/ER /5 /5  Knee Flex 4-/5 4-/5  Knee Ext 4-/5 4-/5  Ankle DF 3/5 4-/5  Ankle PF 4/5 4/5   SENSATION: Decreased sensation to light touch in LE's   NEUROLOGICAL SCREEN: (2+ unless otherwise noted.) N=normal  Ab=abnormal   Level Dermatome R L  C3 Anterior Neck  N N  C4 Top of Shoulder N N  C5 Lateral Upper Arm  N N  C6 Lateral Arm/ Thumb  N N  C7 Middle Finger  N N  C8 4th & 5th Finger N N  T1 Medial Arm N N  L2 Medial thigh/groin AB AB  L3 Lower thigh/med.knee AB AB  L4 Medial leg/lat thigh AB AB  L5 Lat. leg & dorsal foot AB AB  S1 post/lat foot/thigh/leg AB AB  S2 Post./med. thigh & leg AB AB      FUNCTIONAL MOBILITY: BUE heavy reliance on handrails for standing portion with noted decreased speed due to body habitus and SOB with 5xSTS. Pt required 2 standing rest breaks during testing due to fatigue.   BALANCE: Static Sitting Balance  Normal Able to maintain balance against maximal resistance   Good Able to maintain balance against moderate resistance   Good-/Fair+ Accepts minimal resistance   Fair Able to sit unsupported without balance loss and without UE support   Poor+ Able to maintain with Minimal assistance from individual or chair   Poor Unable to maintain balance-requires mod/max support from individual or chair X   Static Standing Balance  Normal Able to maintain  standing balance against maximal resistance   Good Able to maintain standing balance against moderate resistance   Good-/Fair+ Able to maintain standing balance against minimal resistance   Fair Able to stand unsupported without UE support and without LOB for 1-2 min   Fair- Requires Min A and UE support to maintain standing without loss of balance X  Poor+ Requires mod A and UE support to maintain standing without loss of balance   Poor Requires max A and UE support to maintain standing balance without loss    Dynamic Sitting Balance  Normal Able to sit unsupported and weight shift across midline maximally   Good Able to sit unsupported and weight shift across midline moderately   Good-/Fair+ Able to sit unsupported and weight shift across midline minimally   Fair Minimal weight shifting ipsilateral/front, difficulty crossing midline   Fair- Reach to ipsilateral side and unable to weight shift X  Poor + Able to sit unsupported with min A and reach to ipsilateral side, unable to weight shift   Poor Able to sit unsupported with mod A and reach ipsilateral/front-can't cross midline    Standing Dynamic Balance  Normal Stand independently unsupported, able to weight shift and cross midline maximally   Good Stand independently unsupported, able to weight shift and cross midline moderately   Good-/Fair+ Stand independently unsupported, able to weight shift across midline minimally   Fair Stand independently unsupported, weight shift, and reach ipsilaterally, loss of balance when crossing midline   Poor+ Able to stand with Min A and reach ipsilaterally, unable to weight shift   Poor Able to stand with Mod A and minimally reach ipsilaterally, unable to cross midline. X     GAIT: Significant decreased gait speed with RW use. Heavy reliance on UE's with limited B knee/hip flexion and hip extension with poor B ankle rocker.    OUTCOME MEASURES: TEST Outcome Interpretation  5 times sit<>stand 1  min, 15.32 Sec (BUE support) >33 yo, >15 sec indicates increased risk for falls  10 meter walk test               35.7 sec and 0.28  M/s (with RW) <1.0 m/s indicates increased risk for falls; limited community ambulator              FOTO 40 D/c goal of 48   HR: 119 BPM after 5xSTS HR: 108 BPM after 10 m gait speed   [facility] PT Assessment - 04/15/20 1529      Assessment   Medical Diagnosis Physical Deconditioning    Referring Provider (PT) Dr. [name] MD    Onset Date/Surgical Date 02/14/13    Hand Dominance Right    Prior Therapy Yes      Precautions   Precautions Fall      Restrictions   Weight Bearing Restrictions No      Balance Screen   Has the patient fallen in the past 6 months Yes    How many times? 3 full falls, daily near fails    Has the patient had a decrease in activity level because of a fear of falling?  Yes    Is the patient reluctant to leave their home because of a fear of falling?  No      Home Environment   Living Environment Private residence    Living Arrangements Spouse/significant other    Available Help at Discharge Family    Type of [place] One level    [place] - 2 wheels;Grab bars - tub/shower;Grab bars - toilet;Toilet riser;Shower seat      Prior Function   Level of Independence Independent with basic ADLs    Vocation --   was working as an Surveyor, mining driving, stopped due to health and COVID  Leisure play with his dog      Cognition   Overall Cognitive Status Within Functional Limits for tasks assessed      Observation/Other Assessments   Focus on Therapeutic Outcomes (FOTO)  40            Objective measurements completed on examination: See above findings.     PT Education - 04/15/20 1556    Education provided Yes    Education Details Exercise technique, breathing    Person(s) Educated Patient    Methods Explanation;Demonstration;Tactile cues;Verbal cues     Comprehension Verbalized understanding;Returned demonstration            PT Short Term Goals - 04/15/20 1644      PT SHORT TERM GOAL #1   Title Pt will be compliant with given HEP to improve LE muscle strength.    Baseline 3/2: Will be given exercises next session.    Time 4    Period Weeks    Status New    Target Date 05/13/20             PT Long Term Goals - 04/15/20 1645      PT LONG TERM GOAL #1   Title Pt will improve FOTO goal to 48 to display perceived improvements in functional mobility.    Baseline 3/2: 40    Time 8    Period Weeks    Status New    Target Date 06/10/20      PT LONG TERM GOAL #2   Title Patient (> 23 years old) will complete five times sit to stand test in < 1 minute indicating an increased LE strength and improved balance.    Baseline 3/2: 1 min, 15.32 sec    Time 8    Period Weeks    Status New    Target Date 06/10/20      PT LONG TERM GOAL #3   Title Patient will deny any near falls over the past 8 weeks to demonstrate improved safety awareness at home and community.    Baseline 3/2: Daily near falls reported with turns and reaching    Time 8    Period Weeks    Status New    Target Date 06/10/20      PT LONG TERM GOAL #4   Title Patient will increase 10 meter walk test to >0.5 m/s as to improve gait speed for better community ambulation and to reduce fall risk.    Baseline 3/2: 35.7 sec or 0.28 m/s    Time 8    Period Weeks    Status New    Target Date 06/10/20                  Plan - 04/15/20 1630    Clinical Impression Statement Pt is a very pleasant 80 y.o. male referred to PT for physical deconditioning.  Pt is known to this clinic and has received rehab here in 2019. Pt has a significant cardiopulomary PMH of COPD, A-fib, Aortic Atherosclerosis, ischemic cardiomyopathy, Benign hypertension with chronic kidney disease, stage III (CMS-HCC); Essential hypertension; Chronic bronchitis, unspecified chronic bronchitis type  (CMS-HCC); OSA (obstructive sleep apnea); Morbid obesity with BMI of 40.0-44.9, adult (CMS-HCC). Pt presents with decreased sensation in LE's and decreased LE muscle strength that was assessed via MMT. Pt has no pain in B knees which limited pt in previous bouts of PT in 2019, however has c/o L shoulder pain with elevation. Due to SOB and decreased physical conditioning,  pt significantly limited in his tolerance for objective tests. Pt performed 5xSTS (1:15.32 minutes), 10 m walk test (35.7 sec/0.28 m/s) which indicates pt is at significant increased risk of falls. FOTO score recorded at 40 with D/c target score of 48. Pt presents with significantly decreased gait speed with RW use relying heavily on BUE support with limited B knee/hip flexion during swing phase and limited B hip extension during terminal stance of gait cycle. Due to these noted impairments above, pt is unable to safely perform community ADL's/walking distances safely putting pt at risk for falls and rehospitalization which has occurred in the past. Thus pt can benefit from skilled PT services to improve aformentioned impairments so pt can improve his capacity for functional mobility.    Personal Factors and Comorbidities Age;Comorbidity 3+;Past/Current Experience    Comorbidities OPD, A-fib, Aortic Atherosclerosis, ischemic cardiomyopathy, Benign hypertension with chronic kidney disease, stage III (CMS-HCC); Essential hypertension; Chronic bronchitis, unspecified chronic bronchitis type (CMS-HCC); OSA (obstructive sleep apnea); Morbid obesity with BMI of 40.0-44.9, adult (CMS-HCC)    Examination-Activity Limitations Bend;Carry;Squat;Continence;Stand;Transfers;Clinical cytogeneticist Activity;Laundry;Yard Work    Stability/Clinical Decision Making Evolving/Moderate complexity    Clinical Decision Making Moderate    Rehab Potential Fair    Clinical Impairments Affecting Rehab  Potential Significant cardiopulmonary pathologies.    PT Frequency 2x / week    PT Duration 8 weeks    PT Treatment/Interventions ADLs/Self Care Home Management;Moist Heat;Cryotherapy;Electrical Stimulation;Functional mobility training;Therapeutic activities;Therapeutic exercise;DME Instruction;Gait training;Balance training;Neuromuscular re-education;Patient/family education;Energy conservation;Biofeedback;Ultrasound;Stair training;Orthotic Fit/Training;Manual techniques;Manual lymph drainage;Dry needling;Passive range of motion;Taping;Vestibular;Visual/perceptual remediation/compensation    PT Next Visit Plan Basic standing and seated LE exercises.    PT Home Exercise Plan Establish in next session    Consulted and Agree with Plan of Care Patient           Patient will benefit from skilled therapeutic intervention in order to improve the following deficits and impairments:  Abnormal gait,Difficulty walking,Cardiopulmonary status limiting activity,Decreased endurance,Impaired UE functional use,Obesity,Decreased activity tolerance,Decreased balance,Improper body mechanics,Decreased mobility,Decreased strength,Impaired sensation,Postural dysfunction,Increased edema,Impaired flexibility,Decreased range of motion  Visit Diagnosis: Muscle weakness (generalized)  Unsteadiness on feet  Other abnormalities of gait and mobility     Problem List Patient Active Problem List   Diagnosis Date Noted  . Atrial fibrillation ([place]) 04/09/2019  . Essential hypertension 04/09/2019  . Venous stasis dermatitis of both lower extremities 04/09/2019  . Lower limb ulcer, ankle, right, limited to breakdown of skin ([place]) 04/09/2019  . Rhabdomyolysis 12/10/2018  . Sepsis ([place]) 07/28/2018  . Cough 03/20/2015  . Bronchitis, chronic obstructive w acute bronchitis ([place]) 03/20/2015  . Umbilical hernia without obstruction and without gangrene 01/12/2015  . Ventral hernia without obstruction or gangrene 01/02/2015     [place]em Caster. Fairly IV, PT, DPT Physical Therapist- [facility]  04/16/2020, 12:18 PM  [place] MAIN [facility] SERVICES [address] [place], [place], [address] Phone: [phone]   Fax:  [phone]  Name: Daniel Eaton. MRN: [id number] Date of Birth: [birth year]

## 2020-04-16 DIAGNOSIS — J42 Unspecified chronic bronchitis: Secondary | ICD-10-CM | POA: Diagnosis not present

## 2020-04-16 DIAGNOSIS — Z6841 Body Mass Index (BMI) 40.0 and over, adult: Secondary | ICD-10-CM | POA: Diagnosis not present

## 2020-04-16 DIAGNOSIS — L97511 Non-pressure chronic ulcer of other part of right foot limited to breakdown of skin: Secondary | ICD-10-CM | POA: Diagnosis not present

## 2020-04-16 DIAGNOSIS — E1142 Type 2 diabetes mellitus with diabetic polyneuropathy: Secondary | ICD-10-CM | POA: Diagnosis not present

## 2020-04-16 DIAGNOSIS — I129 Hypertensive chronic kidney disease with stage 1 through stage 4 chronic kidney disease, or unspecified chronic kidney disease: Secondary | ICD-10-CM | POA: Diagnosis not present

## 2020-04-16 DIAGNOSIS — L97521 Non-pressure chronic ulcer of other part of left foot limited to breakdown of skin: Secondary | ICD-10-CM | POA: Diagnosis not present

## 2020-04-16 DIAGNOSIS — E039 Hypothyroidism, unspecified: Secondary | ICD-10-CM | POA: Diagnosis not present

## 2020-04-16 DIAGNOSIS — M791 Myalgia, unspecified site: Secondary | ICD-10-CM | POA: Diagnosis not present

## 2020-04-16 DIAGNOSIS — I700 Atherosclerosis of aorta: Secondary | ICD-10-CM | POA: Diagnosis not present

## 2020-04-16 DIAGNOSIS — B351 Tinea unguium: Secondary | ICD-10-CM | POA: Diagnosis not present

## 2020-04-16 DIAGNOSIS — R7303 Prediabetes: Secondary | ICD-10-CM | POA: Diagnosis not present

## 2020-04-16 DIAGNOSIS — N183 Chronic kidney disease, stage 3 unspecified: Secondary | ICD-10-CM | POA: Diagnosis not present

## 2020-04-16 DIAGNOSIS — I255 Ischemic cardiomyopathy: Secondary | ICD-10-CM | POA: Diagnosis not present

## 2020-04-16 DIAGNOSIS — T466X5A Adverse effect of antihyperlipidemic and antiarteriosclerotic drugs, initial encounter: Secondary | ICD-10-CM | POA: Diagnosis not present

## 2020-04-16 DIAGNOSIS — I4819 Other persistent atrial fibrillation: Secondary | ICD-10-CM | POA: Diagnosis not present

## 2020-04-21 ENCOUNTER — Ambulatory Visit: Payer: HMO | Admitting: Physical Therapy

## 2020-04-28 ENCOUNTER — Ambulatory Visit: Payer: HMO | Admitting: Physical Therapy

## 2020-04-30 ENCOUNTER — Other Ambulatory Visit: Payer: Self-pay

## 2020-04-30 ENCOUNTER — Ambulatory Visit: Payer: HMO

## 2020-04-30 DIAGNOSIS — M6281 Muscle weakness (generalized): Secondary | ICD-10-CM | POA: Diagnosis not present

## 2020-04-30 DIAGNOSIS — R2689 Other abnormalities of gait and mobility: Secondary | ICD-10-CM

## 2020-04-30 DIAGNOSIS — R2681 Unsteadiness on feet: Secondary | ICD-10-CM

## 2020-04-30 NOTE — Therapy (Signed)
[place] MAIN [facility] SERVICES [address] [place], [place], [address] Phone: [phone]   Fax:  [phone]  Physical Therapy Treatment  Patient Details  Name: Daniel Eaton. MRN: [id number] Date of Birth: 1940/07/22 Referring Provider (PT): Dr. [name] MD   Encounter Date: 04/30/2020   PT End of Session - 04/30/20 1354    Visit Number 2    Number of Visits 16    Date for PT Re-Evaluation 06/10/20    Authorization Type 2/10    Authorization - Visit Number 1    Authorization - Number of Visits 10    Progress Note Due on Visit 10    PT Start Time 1518    PT Stop Time 1601    PT Time Calculation (min) 43 min    Equipment Utilized During Treatment Gait belt    Activity Tolerance Patient limited by fatigue    Behavior During Therapy WFL for tasks assessed/performed           Past Medical History:  Diagnosis Date  . A-fib ([place])   . Cellulitis   . CHF (congestive heart failure) ([place])   . COPD (chronic obstructive pulmonary disease) ([place])   . Difficult intubation   . Hyperlipidemia   . Hypertension   . Hypothyroidism   . Kidney disease   . Sleep apnea   . Stroke ([place])   . Thyroid disease     Past Surgical History:  Procedure Laterality Date  . CARDIAC CATHETERIZATION    . CATARACT EXTRACTION W/ INTRAOCULAR LENS  IMPLANT, BILATERAL    . COLONOSCOPY  2008  . HERNIA REPAIR     bilateral inguinal hernia/ [place]  . HERNIA REPAIR  02/02/2015   18 x 28 cm ventral light mesh placed laparoscopically.  . TONSILLECTOMY    . UMBILICAL HERNIA REPAIR N/A 02/02/2015   Procedure: HERNIA REPAIR UMBILICAL ADULT;  Surgeon: Robert Bellow, MD;  Location: [facility] ORS;  Service: General;  Laterality: N/A;  . VENTRAL HERNIA REPAIR N/A 02/02/2015   Procedure: LAPAROSCOPIC VENTRAL HERNIA;  Surgeon: Robert Bellow, MD;  Location: [facility] ORS;  Service: General;  Laterality: N/A;  . vp shunt placement  1979    There were no  vitals filed for this visit.   Subjective Assessment - 04/30/20 1353    Subjective Pt has had no falls or stumbles. Has had difficulty making it to therapy due to car issues. Pt reports trying to be as active as possible.    Pertinent History Pt is a 80 y.o. male referred to PT for Physical deconditioning. Pt is known to this clinic and has received rehab here in 2019. Pt has a significant PMH of COPD, A-fib, Aortic Atherosclerosis, ischemic cardiomyopathy, benign hypertension with chronic kidney disease, stage III (CMS-HCC); Essential hypertension; Chronic bronchitis, unspecified chronic bronchitis type (CMS-HCC); OSA (obstructive sleep apnea); Morbid obesity with BMI of 40.0-44.9, adult (CMS-HCC) per last cardiology visit. Pt reports being unable to being able to participate in PT during his stay at [facility] this previous October and also tested positive for COVID-19. Once home pt received HH PT up until recently. He completed 2 episodes of OP PT prior to returning to [facility] that were not successful which is why the pt is here at [facility]. Pt reports he is able to stand still for 5 min, can sit for 1 hour comfortably. A fall reported since last October which has put him in his last hospitilzation  with daily near falls (1-2x/day) with turning or reaching.  Pt's goal is to focus on gait, balance, LE strength, and walking endurance with RW.    Limitations Standing;House hold activities;Walking;Lifting    How long can you sit comfortably? 1 hour    How long can you stand comfortably? 5 min with RW    How long can you walk comfortably? 15 min    Patient Stated Goals Improve gait, balance, and LE strength. Reduce risk of falls.    Currently in Pain? No/denies            Vitals:   BP: 139/76 mm Hg  HR: 89-93 BPM  SPO2: 99%  There.ex:   LAQ's in seated: 1x20   transfer from transfer chair to standard height chair with RW with CGA.   HR: 112 BPM with transfer and SOB. SPO2: 99-100%  STS  with RW in front of pt for safety: x6. HR: 119 BPM, SPO2: 97%. SBA  Standing marches: 2x10. Seated rest between bouts. SBA Green TB exercises:  Seated hip ER/abd: 1x20, 5 sec holds   Nu-Step for 5 min to improve cardiopulmonary endurance  *During seated rest breaks pt educated on performing scap retractions for postural stabilization due to frequent seating throughout day and forward head posture.    Pt requires extra time to complete exercise and frequent seated rest breaks due to deconditioning with SOB.      PT Short Term Goals - 04/15/20 1644      PT SHORT TERM GOAL #1   Title Pt will be compliant with given HEP to improve LE muscle strength.    Baseline 3/2: Will be given exercises next session.    Time 4    Period Weeks    Status New    Target Date 05/13/20             PT Long Term Goals - 04/15/20 1645      PT LONG TERM GOAL #1   Title Pt will improve FOTO goal to 48 to display perceived improvements in functional mobility.    Baseline 3/2: 40    Time 8    Period Weeks    Status New    Target Date 06/10/20      PT LONG TERM GOAL #2   Title Patient (> 31 years old) will complete five times sit to stand test in < 1 minute indicating an increased LE strength and improved balance.    Baseline 3/2: 1 min, 15.32 sec    Time 8    Period Weeks    Status New    Target Date 06/10/20      PT LONG TERM GOAL #3   Title Patient will deny any near falls over the past 8 weeks to demonstrate improved safety awareness at home and community.    Baseline 3/2: Daily near falls reported with turns and reaching    Time 8    Period Weeks    Status New    Target Date 06/10/20      PT LONG TERM GOAL #4   Title Patient will increase 10 meter walk test to >0.5 m/s as to improve gait speed for better community ambulation and to reduce fall risk.    Baseline 3/2: 35.7 sec or 0.28 m/s    Time 8    Period Weeks    Status New    Target Date 06/10/20  Plan -  04/30/20 1518    Clinical Impression Statement Pt appears to be at baseline when evaluated 2 weeks ago. Pt unable to attend treatments due to car troubles until today. Pt ambulating and transferring with supervision from PT but requires RW for support. Pt limited in standing exercise and heavily relies on BUE support of RW to complete due to deconditioning. Frequent seated rests required with monitoring of vitals throughout. Pt can continue to benefit from skilled PT treatment so pt can improve functional capacity to complete ADL's.    Personal Factors and Comorbidities Age;Comorbidity 3+;Past/Current Experience    Comorbidities OPD, A-fib, Aortic Atherosclerosis, ischemic cardiomyopathy, Benign hypertension with chronic kidney disease, stage III (CMS-HCC); Essential hypertension; Chronic bronchitis, unspecified chronic bronchitis type (CMS-HCC); OSA (obstructive sleep apnea); Morbid obesity with BMI of 40.0-44.9, adult (CMS-HCC)    Examination-Activity Limitations Bend;Carry;Squat;Continence;Stand;Transfers;Clinical cytogeneticist Activity;Laundry;Yard Work    [employer] officer Evolving/Moderate complexity    Rehab Potential Fair    Clinical Impairments Affecting Rehab Potential Significant cardiopulmonary pathologies.    PT Frequency 2x / week    PT Duration 8 weeks    PT Treatment/Interventions ADLs/Self Care Home Management;Moist Heat;Cryotherapy;Electrical Stimulation;Functional mobility training;Therapeutic activities;Therapeutic exercise;DME Instruction;Gait training;Balance training;Neuromuscular re-education;Patient/family education;Energy conservation;Biofeedback;Ultrasound;Stair training;Orthotic Fit/Training;Manual techniques;Manual lymph drainage;Dry needling;Passive range of motion;Taping;Vestibular;Visual/perceptual remediation/compensation    PT Next Visit Plan Basic standing and seated LE exercises.    PT Home  Exercise Plan Establish in next session    Consulted and Agree with Plan of Care Patient           Patient will benefit from skilled therapeutic intervention in order to improve the following deficits and impairments:  Abnormal gait,Difficulty walking,Cardiopulmonary status limiting activity,Decreased endurance,Impaired UE functional use,Obesity,Decreased activity tolerance,Decreased balance,Improper body mechanics,Decreased mobility,Decreased strength,Impaired sensation,Postural dysfunction,Increased edema,Impaired flexibility,Decreased range of motion  Visit Diagnosis: Muscle weakness (generalized)  Unsteadiness on feet  Other abnormalities of gait and mobility     Problem List Patient Active Problem List   Diagnosis Date Noted  . Atrial fibrillation ([place]) 04/09/2019  . Essential hypertension 04/09/2019  . Venous stasis dermatitis of both lower extremities 04/09/2019  . Lower limb ulcer, ankle, right, limited to breakdown of skin ([place]) 04/09/2019  . Rhabdomyolysis 12/10/2018  . Sepsis ([place]) 07/28/2018  . Cough 03/20/2015  . Bronchitis, chronic obstructive w acute bronchitis ([place]) 03/20/2015  . Umbilical hernia without obstruction and without gangrene 01/12/2015  . Ventral hernia without obstruction or gangrene 01/02/2015    Salem Caster. Fairly IV, PT, DPT Physical Therapist- [facility]  04/30/2020, 3:23 PM  [place] MAIN [facility] SERVICES [address] [place], [place], [address] Phone: [phone]   Fax:  [phone]  Name: Daniel Eaton. MRN: [id number] Date of Birth: 1940/07/31

## 2020-05-04 ENCOUNTER — Other Ambulatory Visit: Payer: Self-pay

## 2020-05-04 ENCOUNTER — Ambulatory Visit: Payer: HMO

## 2020-05-04 DIAGNOSIS — M6281 Muscle weakness (generalized): Secondary | ICD-10-CM | POA: Diagnosis not present

## 2020-05-04 DIAGNOSIS — R2681 Unsteadiness on feet: Secondary | ICD-10-CM

## 2020-05-04 DIAGNOSIS — R2689 Other abnormalities of gait and mobility: Secondary | ICD-10-CM

## 2020-05-04 NOTE — Therapy (Signed)
[place] MAIN [facility] SERVICES [address] [place], [place], [address] Phone: [phone]   Fax:  [phone]  Physical Therapy Treatment  Patient Details  Name: Daniel Eaton. MRN: [id number] Date of Birth: Jul 06, 1940 Referring Provider (PT): Dr. [name] MD   Encounter Date: 05/04/2020   PT End of Session - 05/04/20 1448    Visit Number 3    Number of Visits 16    Date for PT Re-Evaluation 06/10/20    PT Start Time 1435    PT Stop Time 1513    PT Time Calculation (min) 38 min    Activity Tolerance Patient limited by fatigue;No increased pain    Behavior During Therapy WFL for tasks assessed/performed           Past Medical History:  Diagnosis Date  . A-fib ([place])   . Cellulitis   . CHF (congestive heart failure) ([place])   . COPD (chronic obstructive pulmonary disease) ([place])   . Difficult intubation   . Hyperlipidemia   . Hypertension   . Hypothyroidism   . Kidney disease   . Sleep apnea   . Stroke ([place])   . Thyroid disease     Past Surgical History:  Procedure Laterality Date  . CARDIAC CATHETERIZATION    . CATARACT EXTRACTION W/ INTRAOCULAR LENS  IMPLANT, BILATERAL    . COLONOSCOPY  2008  . HERNIA REPAIR     bilateral inguinal hernia/ [place]  . HERNIA REPAIR  02/02/2015   18 x 28 cm ventral light mesh placed laparoscopically.  . TONSILLECTOMY    . UMBILICAL HERNIA REPAIR N/A 02/02/2015   Procedure: HERNIA REPAIR UMBILICAL ADULT;  Surgeon: Robert Bellow, MD;  Location: [facility] ORS;  Service: General;  Laterality: N/A;  . VENTRAL HERNIA REPAIR N/A 02/02/2015   Procedure: LAPAROSCOPIC VENTRAL HERNIA;  Surgeon: Robert Bellow, MD;  Location: [facility] ORS;  Service: General;  Laterality: N/A;  . vp shunt placement  1979    There were no vitals filed for this visit.   Subjective Assessment - 05/04/20 1446    Subjective Pt doing well today in general. He was sore at home after his PT session, but he was  motivated to continues with his exercises at home. He tries to walk often, but this remains limited.    Pertinent History Pt is a 80 y.o. male referred to PT for Physical deconditioning. Pt is known to this clinic and has received rehab here in 2019. Pt has a significant PMH of COPD, A-fib, Aortic Atherosclerosis, ischemic cardiomyopathy, benign hypertension with chronic kidney disease, stage III (CMS-HCC); Essential hypertension; Chronic bronchitis, unspecified chronic bronchitis type (CMS-HCC); OSA (obstructive sleep apnea); Morbid obesity with BMI of 40.0-44.9, adult (CMS-HCC) per last cardiology visit. Pt reports being unable to being able to participate in PT during his stay at [facility] this previous October and also tested positive for COVID-19. Once home pt received HH PT up until recently. He completed 2 episodes of OP PT prior to returning to [facility] that were not successful which is why the pt is here at [facility]. Pt reports he is able to stand still for 5 min, can sit for 1 hour comfortably. A fall reported since last October which has put him in his last hospitilzation with daily near falls (1-2x/day) with turning or reaching.  Pt's goal is to focus on gait, balance, LE strength, and walking endurance with RW.    Pain Score --  soreness in bilat quads since last sessions.         INTERVENTION THIS DATE:  -BP assessment WNL -SPT transport chair to guest arm chair minA (dificulty getting out of transport chair)  -LAQ 2x10 bilat -seated marching, alternating pattern 1x20  -seated ankle PF, forefoot on 1/2 roll to start in ankle DF 1x15 (similar to toe-off at gait)  Cues for full range  -STS from chair x2, minA for chair stabilization, then pushed against wall  -2x5 STS from chair + airex c BUE support on chair arms  -seated ankle PF, forefoot on 1/2 roll to start in ankle DF 1x15 (similar to toe-off at gait) -seated marching, alternating pattern 1x20  -Educated on performing  table slides at home 10x3secH  -AMB from ADL kitchen to Nustep c RW supervision level  -NUSTEP after session, level 2, seat 13, arms 11; 5 minutes MinA for safe mount, dismount of Nustep      PT Short Term Goals - 04/15/20 1644      PT SHORT TERM GOAL #1   Title Pt will be compliant with given HEP to improve LE muscle strength.    Baseline 3/2: Will be given exercises next session.    Time 4    Period Weeks    Status New    Target Date 05/13/20             PT Long Term Goals - 04/15/20 1645      PT LONG TERM GOAL #1   Title Pt will improve FOTO goal to 48 to display perceived improvements in functional mobility.    Baseline 3/2: 40    Time 8    Period Weeks    Status New    Target Date 06/10/20      PT LONG TERM GOAL #2   Title Patient (> 22 years old) will complete five times sit to stand test in < 1 minute indicating an increased LE strength and improved balance.    Baseline 3/2: 1 min, 15.32 sec    Time 8    Period Weeks    Status New    Target Date 06/10/20      PT LONG TERM GOAL #3   Title Patient will deny any near falls over the past 8 weeks to demonstrate improved safety awareness at home and community.    Baseline 3/2: Daily near falls reported with turns and reaching    Time 8    Period Weeks    Status New    Target Date 06/10/20      PT LONG TERM GOAL #4   Title Patient will increase 10 meter walk test to >0.5 m/s as to improve gait speed for better community ambulation and to reduce fall risk.    Baseline 3/2: 35.7 sec or 0.28 m/s    Time 8    Period Weeks    Status New    Target Date 06/10/20                 Plan - 05/04/20 1449    Clinical Impression Statement Continued with current plan of care as laid out in evaluation and recent prior sessions. Pt remains motivated to advance progress toward goals. Rest breaks provided as needed, pt quick to ask when needed. Author maintains all interventions within appropriate level of intensity as  not to purposefully exacerbate pain. Pt has exertional dyspnea that limits most actiiity more than any isolated or focal weakness. VSS during session, rates into  upper 110s after sets. Pt does require varying levels of assistance and cuing for completion of exercises for correct form and sometimes due to pain/weakness. Pt continues to demonstrate progress toward goals AEB progression of some interventions this date either in volume or intensity. No updates to HEP this date.    Personal Factors and Comorbidities Age;Comorbidity 3+;Past/Current Experience    Comorbidities OPD, A-fib, Aortic Atherosclerosis, ischemic cardiomyopathy, Benign hypertension with chronic kidney disease, stage III (CMS-HCC); Essential hypertension; Chronic bronchitis, unspecified chronic bronchitis type (CMS-HCC); OSA (obstructive sleep apnea); Morbid obesity with BMI of 40.0-44.9, adult (CMS-HCC)    Examination-Activity Limitations Bend;Carry;Squat;Continence;Stand;Transfers;Clinical cytogeneticist Activity;Laundry;Yard Work    Stability/Clinical Decision Making Evolving/Moderate complexity    Clinical Decision Making Moderate    Rehab Potential Fair    Clinical Impairments Affecting Rehab Potential Significant cardiopulmonary pathologies.    PT Frequency 2x / week    PT Duration 8 weeks    PT Treatment/Interventions ADLs/Self Care Home Management;Moist Heat;Cryotherapy;Electrical Stimulation;Functional mobility training;Therapeutic activities;Therapeutic exercise;DME Instruction;Gait training;Balance training;Neuromuscular re-education;Patient/family education;Energy conservation;Biofeedback;Ultrasound;Stair training;Orthotic Fit/Training;Manual techniques;Manual lymph drainage;Dry needling;Passive range of motion;Taping;Vestibular;Visual/perceptual remediation/compensation    PT Next Visit Plan Basic standing and seated LE exercises.    PT Home Exercise Plan Still  working on HEP at home from prior PT; not reviewed    Consulted and Agree with Plan of Care Patient           Patient will benefit from skilled therapeutic intervention in order to improve the following deficits and impairments:  Abnormal gait,Difficulty walking,Cardiopulmonary status limiting activity,Decreased endurance,Impaired UE functional use,Obesity,Decreased activity tolerance,Decreased balance,Improper body mechanics,Decreased mobility,Decreased strength,Impaired sensation,Postural dysfunction,Increased edema,Impaired flexibility,Decreased range of motion  Visit Diagnosis: Muscle weakness (generalized)  Unsteadiness on feet  Other abnormalities of gait and mobility     Problem List Patient Active Problem List   Diagnosis Date Noted  . Atrial fibrillation ([place]) 04/09/2019  . Essential hypertension 04/09/2019  . Venous stasis dermatitis of both lower extremities 04/09/2019  . Lower limb ulcer, ankle, right, limited to breakdown of skin ([place]) 04/09/2019  . Rhabdomyolysis 12/10/2018  . Sepsis ([place]) 07/28/2018  . Cough 03/20/2015  . Bronchitis, chronic obstructive w acute bronchitis ([place]) 03/20/2015  . Umbilical hernia without obstruction and without gangrene 01/12/2015  . Ventral hernia without obstruction or gangrene 01/02/2015   3:11 PM, 05/04/20 Etta Grandchild, PT, DPT Physical Therapist - [place] [phone]     Etta Grandchild 05/04/2020, 2:57 PM  [place] MAIN [facility] SERVICES [address] [place], [place], [address] Phone: [phone]   Fax:  [phone]  Name: Daniel Eaton. MRN: [id number] Date of Birth: [birth year]

## 2020-05-06 ENCOUNTER — Other Ambulatory Visit: Payer: Self-pay

## 2020-05-06 ENCOUNTER — Ambulatory Visit: Payer: HMO

## 2020-05-06 DIAGNOSIS — R2681 Unsteadiness on feet: Secondary | ICD-10-CM

## 2020-05-06 DIAGNOSIS — M6281 Muscle weakness (generalized): Secondary | ICD-10-CM | POA: Diagnosis not present

## 2020-05-06 DIAGNOSIS — R2689 Other abnormalities of gait and mobility: Secondary | ICD-10-CM

## 2020-05-06 NOTE — Therapy (Signed)
[place] MAIN [facility] SERVICES [address] [place], [place], [address] Phone: [phone]   Fax:  [phone]  Physical Therapy Treatment  Patient Details  Name: Daniel Eaton. MRN: [id number] Date of Birth: [birth year] Referring Provider (PT): Dr. [name] MD   Encounter Date: 05/06/2020   PT End of Session - 05/06/20 1445    Visit Number 4    Number of Visits 16    Date for PT Re-Evaluation 06/10/20    PT Start Time 1435    PT Stop Time 1515    PT Time Calculation (min) 40 min    Equipment Utilized During Treatment Gait belt    Activity Tolerance Patient limited by fatigue;No increased pain    Behavior During Therapy WFL for tasks assessed/performed           Past Medical History:  Diagnosis Date  . A-fib ([place])   . Cellulitis   . CHF (congestive heart failure) ([place])   . COPD (chronic obstructive pulmonary disease) ([place])   . Difficult intubation   . Hyperlipidemia   . Hypertension   . Hypothyroidism   . Kidney disease   . Sleep apnea   . Stroke ([place])   . Thyroid disease     Past Surgical History:  Procedure Laterality Date  . CARDIAC CATHETERIZATION    . CATARACT EXTRACTION W/ INTRAOCULAR LENS  IMPLANT, BILATERAL    . COLONOSCOPY  2008  . HERNIA REPAIR     bilateral inguinal hernia/ [place]  . HERNIA REPAIR  02/02/2015   18 x 28 cm ventral light mesh placed laparoscopically.  . TONSILLECTOMY    . UMBILICAL HERNIA REPAIR N/A 02/02/2015   Procedure: HERNIA REPAIR UMBILICAL ADULT;  Surgeon: Robert Bellow, MD;  Location: [facility] ORS;  Service: General;  Laterality: N/A;  . VENTRAL HERNIA REPAIR N/A 02/02/2015   Procedure: LAPAROSCOPIC VENTRAL HERNIA;  Surgeon: Robert Bellow, MD;  Location: [facility] ORS;  Service: General;  Laterality: N/A;  . vp shunt placement  1979    There were no vitals filed for this visit.   Subjective Assessment - 05/06/20 1443    Subjective Pt remains severely motivated to  progress his general wellbeing and independence. He was sore after monday's session, but he worked on his HEP and walking.    Pertinent History Pt is a 80 y.o. male referred to PT for Physical deconditioning. Pt is known to this clinic and has received rehab here in 2019. Pt has a significant PMH of COPD, A-fib, Aortic Atherosclerosis, ischemic cardiomyopathy, benign hypertension with chronic kidney disease, stage III (CMS-HCC); Essential hypertension; Chronic bronchitis, unspecified chronic bronchitis type (CMS-HCC); OSA (obstructive sleep apnea); Morbid obesity with BMI of 40.0-44.9, adult (CMS-HCC) per last cardiology visit. Pt reports being unable to being able to participate in PT during his stay at [facility] this previous October and also tested positive for COVID-19. Once home pt received HH PT up until recently. He completed 2 episodes of OP PT prior to returning to [facility] that were not successful which is why the pt is here at [facility]. Pt reports he is able to stand still for 5 min, can sit for 1 hour comfortably. A fall reported since last October which has put him in his last hospitilzation with daily near falls (1-2x/day) with turning or reaching.  Pt's goal is to focus on gait, balance, LE strength, and walking endurance with RW.  INTERVENTION THIS DATE:  -SPT transport chair to guest arm chair minA (dificulty getting out of transport chair) -1x10 STS from chair + airex c BUE support on chair arms  *3 minutes recovery due to DOE  -1x10 STS from chair + airex c BUE support on chair arms  *3 minutes recovery due to DOE  -Side step in // bars: Right, Left, Right (seated break)  *3 minutes recovery due to DOE  -Side step in // bars: Right, Left, Right (seated break) *3 minutes recovery due to DOE  -Static stance on airex pad 1x30sec -Fwd/backward step-ups floor to airex 1x10 (technique ad lib)  - Static stance on airex 2x15sec  -NUSTEP after session, level 2, seat  13, arms 11; 5 minutes MinA for safe mount, dismount of Nustep     PT Short Term Goals - 04/15/20 1644      PT SHORT TERM GOAL #1   Title Pt will be compliant with given HEP to improve LE muscle strength.    Baseline 3/2: Will be given exercises next session.    Time 4    Period Weeks    Status New    Target Date 05/13/20             PT Long Term Goals - 04/15/20 1645      PT LONG TERM GOAL #1   Title Pt will improve FOTO goal to 48 to display perceived improvements in functional mobility.    Baseline 3/2: 40    Time 8    Period Weeks    Status New    Target Date 06/10/20      PT LONG TERM GOAL #2   Title Patient (> 81 years old) will complete five times sit to stand test in < 1 minute indicating an increased LE strength and improved balance.    Baseline 3/2: 1 min, 15.32 sec    Time 8    Period Weeks    Status New    Target Date 06/10/20      PT LONG TERM GOAL #3   Title Patient will deny any near falls over the past 8 weeks to demonstrate improved safety awareness at home and community.    Baseline 3/2: Daily near falls reported with turns and reaching    Time 8    Period Weeks    Status New    Target Date 06/10/20      PT LONG TERM GOAL #4   Title Patient will increase 10 meter walk test to >0.5 m/s as to improve gait speed for better community ambulation and to reduce fall risk.    Baseline 3/2: 35.7 sec or 0.28 m/s    Time 8    Period Weeks    Status New    Target Date 06/10/20                 Plan - 05/06/20 1449    Clinical Impression Statement Continued with current plan of care as laid out in evaluation and recent prior sessions. Pt remains motivated to advance progress toward goals, but remains very deconditioned and easily exerted. His DOE is quickly stirred and requires a few minutes each effort for reasonable recovery. Rest breaks provided as needed, pt quick to ask when needed. Author maintains all interventions within appropriate level of  intensity as not to purposefully exacerbate fatigue, however pt's drive can easily supercede limits. Pt does require varying levels of assistance and cuing for completion of exercises for correct form  and sometimes due to pain/weakness. STS reps increased from sets of 5 to sets of 10 today. Pt continues to demonstrate progress toward goals AEB progression of some interventions this date either in volume or intensity. No updates to HEP this date.    Personal Factors and Comorbidities Age;Comorbidity 3+;Past/Current Experience    Comorbidities OPD, A-fib, Aortic Atherosclerosis, ischemic cardiomyopathy, Benign hypertension with chronic kidney disease, stage III (CMS-HCC); Essential hypertension; Chronic bronchitis, unspecified chronic bronchitis type (CMS-HCC); OSA (obstructive sleep apnea); Morbid obesity with BMI of 40.0-44.9, adult (CMS-HCC)    Examination-Activity Limitations Bend;Carry;Squat;Continence;Stand;Transfers;Clinical cytogeneticist Activity;Laundry;Yard Work    Stability/Clinical Decision Making Evolving/Moderate complexity    Clinical Decision Making Moderate    Rehab Potential Fair    Clinical Impairments Affecting Rehab Potential Significant cardiopulmonary pathologies.    PT Frequency 2x / week    PT Duration 8 weeks    PT Treatment/Interventions ADLs/Self Care Home Management;Moist Heat;Cryotherapy;Electrical Stimulation;Functional mobility training;Therapeutic activities;Therapeutic exercise;DME Instruction;Gait training;Balance training;Neuromuscular re-education;Patient/family education;Energy conservation;Biofeedback;Ultrasound;Stair training;Orthotic Fit/Training;Manual techniques;Manual lymph drainage;Dry needling;Passive range of motion;Taping;Vestibular;Visual/perceptual remediation/compensation    PT Next Visit Plan Basic standing and seated LE exercises.    PT Home Exercise Plan Still working on HEP at home from  prior PT; not reviewed    Consulted and Agree with Plan of Care Patient           Patient will benefit from skilled therapeutic intervention in order to improve the following deficits and impairments:  Abnormal gait,Difficulty walking,Cardiopulmonary status limiting activity,Decreased endurance,Impaired UE functional use,Obesity,Decreased activity tolerance,Decreased balance,Improper body mechanics,Decreased mobility,Decreased strength,Impaired sensation,Postural dysfunction,Increased edema,Impaired flexibility,Decreased range of motion  Visit Diagnosis: Muscle weakness (generalized)  Unsteadiness on feet  Other abnormalities of gait and mobility     Problem List Patient Active Problem List   Diagnosis Date Noted  . Atrial fibrillation ([place]) 04/09/2019  . Essential hypertension 04/09/2019  . Venous stasis dermatitis of both lower extremities 04/09/2019  . Lower limb ulcer, ankle, right, limited to breakdown of skin ([place]) 04/09/2019  . Rhabdomyolysis 12/10/2018  . Sepsis ([place]) 07/28/2018  . Cough 03/20/2015  . Bronchitis, chronic obstructive w acute bronchitis ([place]) 03/20/2015  . Umbilical hernia without obstruction and without gangrene 01/12/2015  . Ventral hernia without obstruction or gangrene 01/02/2015   3:35 PM, 05/06/20 Etta Grandchild, PT, DPT Physical Therapist - [place] [phone]     Etta Grandchild 05/06/2020, 2:50 PM  [place] MAIN [facility] SERVICES [address] [place], [place], [address] Phone: [phone]   Fax:  [phone]  Name: Daniel Eaton. MRN: [id number] Date of Birth: 1940-09-20

## 2020-05-11 ENCOUNTER — Ambulatory Visit: Payer: HMO

## 2020-05-13 ENCOUNTER — Ambulatory Visit: Payer: HMO

## 2020-05-18 ENCOUNTER — Ambulatory Visit: Payer: HMO | Attending: Internal Medicine

## 2020-05-18 ENCOUNTER — Other Ambulatory Visit: Payer: Self-pay

## 2020-05-18 DIAGNOSIS — M6281 Muscle weakness (generalized): Secondary | ICD-10-CM | POA: Diagnosis not present

## 2020-05-18 DIAGNOSIS — R2689 Other abnormalities of gait and mobility: Secondary | ICD-10-CM | POA: Insufficient documentation

## 2020-05-18 DIAGNOSIS — R2681 Unsteadiness on feet: Secondary | ICD-10-CM | POA: Insufficient documentation

## 2020-05-18 NOTE — Therapy (Signed)
[place] MAIN [facility] SERVICES [address] [place], [place], [address] Phone: [phone]   Fax:  [phone]  Physical Therapy Treatment  Patient Details  Name: Daniel Eaton. MRN: [id number] Date of Birth: 25-Dec-1940 Referring Provider (PT): Dr. [name] MD   Encounter Date: 05/18/2020   PT End of Session - 05/18/20 1505    Visit Number 5    Number of Visits 16    Date for PT Re-Evaluation 06/10/20    PT Start Time 1430    PT Stop Time 0923    PT Time Calculation (min) 44 min    Equipment Utilized During Treatment Gait belt    Activity Tolerance Patient limited by fatigue;No increased pain    Behavior During Therapy WFL for tasks assessed/performed           Past Medical History:  Diagnosis Date  . A-fib ([place])   . Cellulitis   . CHF (congestive heart failure) ([place])   . COPD (chronic obstructive pulmonary disease) ([place])   . Difficult intubation   . Hyperlipidemia   . Hypertension   . Hypothyroidism   . Kidney disease   . Sleep apnea   . Stroke ([place])   . Thyroid disease     Past Surgical History:  Procedure Laterality Date  . CARDIAC CATHETERIZATION    . CATARACT EXTRACTION W/ INTRAOCULAR LENS  IMPLANT, BILATERAL    . COLONOSCOPY  2008  . HERNIA REPAIR     bilateral inguinal hernia/ [place]  . HERNIA REPAIR  02/02/2015   18 x 28 cm ventral light mesh placed laparoscopically.  . TONSILLECTOMY    . UMBILICAL HERNIA REPAIR N/A 02/02/2015   Procedure: HERNIA REPAIR UMBILICAL ADULT;  Surgeon: Robert Bellow, MD;  Location: [facility] ORS;  Service: General;  Laterality: N/A;  . VENTRAL HERNIA REPAIR N/A 02/02/2015   Procedure: LAPAROSCOPIC VENTRAL HERNIA;  Surgeon: Robert Bellow, MD;  Location: [facility] ORS;  Service: General;  Laterality: N/A;  . vp shunt placement  1979    There were no vitals filed for this visit.   Subjective Assessment - 05/18/20 1458    Subjective Patient missed last week due to  circumstances outside of his control. No falls or LOB since last session.    Pertinent History Pt is a 80 y.o. male referred to PT for Physical deconditioning. Pt is known to this clinic and has received rehab here in 2019. Pt has a significant PMH of COPD, A-fib, Aortic Atherosclerosis, ischemic cardiomyopathy, benign hypertension with chronic kidney disease, stage III (CMS-HCC); Essential hypertension; Chronic bronchitis, unspecified chronic bronchitis type (CMS-HCC); OSA (obstructive sleep apnea); Morbid obesity with BMI of 40.0-44.9, adult (CMS-HCC) per last cardiology visit. Pt reports being unable to being able to participate in PT during his stay at [facility] this previous October and also tested positive for COVID-19. Once home pt received HH PT up until recently. He completed 2 episodes of OP PT prior to returning to [facility] that were not successful which is why the pt is here at [facility]. Pt reports he is able to stand still for 5 min, can sit for 1 hour comfortably. A fall reported since last October which has put him in his last hospitilzation with daily near falls (1-2x/day) with turning or reaching.  Pt's goal is to focus on gait, balance, LE strength, and walking endurance with RW.    Currently in Pain? No/denies  INTERVENTION THIS DATE:  -SPT transport chair to guest arm chair minA (dificulty getting out of transport chair)  Raised plinth table: 5x STS without UE support  superset with 10 scapular retractions cues for postural correction x2 sets  Static stand no UE support 30 seconds x2 trials   Standing with RW march 10x each LE; very challenging  Standing with RW: forward step 10x each LE  Standing with RW: horizontal head turns no UE support 10x each direction; very unchallenging  Seated: Seated adduction ball squeeze 10x; 5 second holds LAQ 10x each LE 3 second holds.  heel toe raises 10x  Seated marches 10x alternating  Patient requires  extensive rest breaks between interventions due to shortness of breath, fatigue, and A fib. Patient highly motivated throughout session and will benefit from continued functional mobility training. Pt can continue to benefit from skilled PT treatment so pt can improve functional capacity to complete ADL's.                   PT Education - 05/18/20 1504    Education provided Yes    Education Details exercise technique, body mechanics    Person(s) Educated Patient    Methods Explanation;Demonstration;Tactile cues;Verbal cues    Comprehension Verbalized understanding;Returned demonstration;Verbal cues required;Tactile cues required            PT Short Term Goals - 04/15/20 1644      PT SHORT TERM GOAL #1   Title Pt will be compliant with given HEP to improve LE muscle strength.    Baseline 3/2: Will be given exercises next session.    Time 4    Period Weeks    Status New    Target Date 05/13/20             PT Long Term Goals - 04/15/20 1645      PT LONG TERM GOAL #1   Title Pt will improve FOTO goal to 48 to display perceived improvements in functional mobility.    Baseline 3/2: 40    Time 8    Period Weeks    Status New    Target Date 06/10/20      PT LONG TERM GOAL #2   Title Patient (> 9 years old) will complete five times sit to stand test in < 1 minute indicating an increased LE strength and improved balance.    Baseline 3/2: 1 min, 15.32 sec    Time 8    Period Weeks    Status New    Target Date 06/10/20      PT LONG TERM GOAL #3   Title Patient will deny any near falls over the past 8 weeks to demonstrate improved safety awareness at home and community.    Baseline 3/2: Daily near falls reported with turns and reaching    Time 8    Period Weeks    Status New    Target Date 06/10/20      PT LONG TERM GOAL #4   Title Patient will increase 10 meter walk test to >0.5 m/s as to improve gait speed for better community ambulation and to reduce fall  risk.    Baseline 3/2: 35.7 sec or 0.28 m/s    Time 8    Period Weeks    Status New    Target Date 06/10/20                 Plan - 05/18/20 1505    Clinical Impression Statement  Patient requires extensive rest breaks between interventions due to shortness of breath, fatigue, and A fib. Patient highly motivated throughout session and will benefit from continued functional mobility training. Pt can continue to benefit from skilled PT treatment so pt can improve functional capacity to complete ADL's.    Personal Factors and Comorbidities Age;Comorbidity 3+;Past/Current Experience    Comorbidities OPD, A-fib, Aortic Atherosclerosis, ischemic cardiomyopathy, Benign hypertension with chronic kidney disease, stage III (CMS-HCC); Essential hypertension; Chronic bronchitis, unspecified chronic bronchitis type (CMS-HCC); OSA (obstructive sleep apnea); Morbid obesity with BMI of 40.0-44.9, adult (CMS-HCC)    Examination-Activity Limitations Bend;Carry;Squat;Continence;Stand;Transfers;Clinical cytogeneticist Activity;Laundry;Yard Work    Merchant navy officer Evolving/Moderate complexity    Rehab Potential Fair    Clinical Impairments Affecting Rehab Potential Significant cardiopulmonary pathologies.    PT Frequency 2x / week    PT Duration 8 weeks    PT Treatment/Interventions ADLs/Self Care Home Management;Moist Heat;Cryotherapy;Electrical Stimulation;Functional mobility training;Therapeutic activities;Therapeutic exercise;DME Instruction;Gait training;Balance training;Neuromuscular re-education;Patient/family education;Energy conservation;Biofeedback;Ultrasound;Stair training;Orthotic Fit/Training;Manual techniques;Manual lymph drainage;Dry needling;Passive range of motion;Taping;Vestibular;Visual/perceptual remediation/compensation    PT Next Visit Plan Basic standing and seated LE exercises.    PT Home Exercise Plan  Establish in next session    Consulted and Agree with Plan of Care Patient           Patient will benefit from skilled therapeutic intervention in order to improve the following deficits and impairments:  Abnormal gait,Difficulty walking,Cardiopulmonary status limiting activity,Decreased endurance,Impaired UE functional use,Obesity,Decreased activity tolerance,Decreased balance,Improper body mechanics,Decreased mobility,Decreased strength,Impaired sensation,Postural dysfunction,Increased edema,Impaired flexibility,Decreased range of motion  Visit Diagnosis: Muscle weakness (generalized)  Unsteadiness on feet  Other abnormalities of gait and mobility     Problem List Patient Active Problem List   Diagnosis Date Noted  . Atrial fibrillation ([place]) 04/09/2019  . Essential hypertension 04/09/2019  . Venous stasis dermatitis of both lower extremities 04/09/2019  . Lower limb ulcer, ankle, right, limited to breakdown of skin ([place]) 04/09/2019  . Rhabdomyolysis 12/10/2018  . Sepsis ([place]) 07/28/2018  . Cough 03/20/2015  . Bronchitis, chronic obstructive w acute bronchitis ([place]) 03/20/2015  . Umbilical hernia without obstruction and without gangrene 01/12/2015  . Ventral hernia without obstruction or gangrene 01/02/2015   Janna Arch, PT, DPT   05/18/2020, 3:17 PM  [place] MAIN [facility] SERVICES [address] [place], [place], [address] Phone: [phone]   Fax:  [phone]  Name: Daniel Eaton. MRN: [id number] Date of Birth: 26-Feb-1940

## 2020-05-20 ENCOUNTER — Ambulatory Visit: Payer: HMO

## 2020-05-20 DIAGNOSIS — M6281 Muscle weakness (generalized): Secondary | ICD-10-CM | POA: Diagnosis not present

## 2020-05-20 DIAGNOSIS — R2689 Other abnormalities of gait and mobility: Secondary | ICD-10-CM

## 2020-05-20 DIAGNOSIS — R2681 Unsteadiness on feet: Secondary | ICD-10-CM

## 2020-05-20 NOTE — Therapy (Signed)
[place] MAIN [facility] SERVICES [address] [place], [place], [address] Phone: [phone]   Fax:  [phone]  Physical Therapy Treatment  Patient Details  Name: Daniel Eaton. MRN: [id number] Date of Birth: [birth year] Referring Provider (PT): Dr. [name] MD   Encounter Date: 05/20/2020   PT End of Session - 05/20/20 1550    Visit Number 6    Number of Visits 16    Date for PT Re-Evaluation 06/10/20    PT Start Time 4496    PT Stop Time 1613    PT Time Calculation (min) 43 min    Equipment Utilized During Treatment Gait belt    Activity Tolerance Patient limited by fatigue;No increased pain    Behavior During Therapy WFL for tasks assessed/performed           Past Medical History:  Diagnosis Date  . A-fib ([place])   . Cellulitis   . CHF (congestive heart failure) ([place])   . COPD (chronic obstructive pulmonary disease) ([place])   . Difficult intubation   . Hyperlipidemia   . Hypertension   . Hypothyroidism   . Kidney disease   . Sleep apnea   . Stroke ([place])   . Thyroid disease     Past Surgical History:  Procedure Laterality Date  . CARDIAC CATHETERIZATION    . CATARACT EXTRACTION W/ INTRAOCULAR LENS  IMPLANT, BILATERAL    . COLONOSCOPY  2008  . HERNIA REPAIR     bilateral inguinal hernia/ [place]  . HERNIA REPAIR  02/02/2015   18 x 28 cm ventral light mesh placed laparoscopically.  . TONSILLECTOMY    . UMBILICAL HERNIA REPAIR N/A 02/02/2015   Procedure: HERNIA REPAIR UMBILICAL ADULT;  Surgeon: Robert Bellow, MD;  Location: [facility] ORS;  Service: General;  Laterality: N/A;  . VENTRAL HERNIA REPAIR N/A 02/02/2015   Procedure: LAPAROSCOPIC VENTRAL HERNIA;  Surgeon: Robert Bellow, MD;  Location: [facility] ORS;  Service: General;  Laterality: N/A;  . vp shunt placement  1979    There were no vitals filed for this visit.   Subjective Assessment - 05/20/20 1549    Subjective Patient reports he was very sore after  his last session. No falls or LOB since last session.    Pertinent History Pt is a 80 y.o. male referred to PT for Physical deconditioning. Pt is known to this clinic and has received rehab here in 2019. Pt has a significant PMH of COPD, A-fib, Aortic Atherosclerosis, ischemic cardiomyopathy, benign hypertension with chronic kidney disease, stage III (CMS-HCC); Essential hypertension; Chronic bronchitis, unspecified chronic bronchitis type (CMS-HCC); OSA (obstructive sleep apnea); Morbid obesity with BMI of 40.0-44.9, adult (CMS-HCC) per last cardiology visit. Pt reports being unable to being able to participate in PT during his stay at [facility] this previous October and also tested positive for COVID-19. Once home pt received HH PT up until recently. He completed 2 episodes of OP PT prior to returning to [facility] that were not successful which is why the pt is here at [facility]. Pt reports he is able to stand still for 5 min, can sit for 1 hour comfortably. A fall reported since last October which has put him in his last hospitilzation with daily near falls (1-2x/day) with turning or reaching.  Pt's goal is to focus on gait, balance, LE strength, and walking endurance with RW.    Currently in Pain? No/denies  SPT transport chair to guest arm chair minA (difficulty getting out of transport chair)   Ambulate 96 ft with RW; one standing rest break Seated: heel toe raise 20x  Ambulate 112 ft with RW; one standing rest break Seated: hip adduction squeeze 10x hold 5 seconds  5x STS with UE support superset with 10 scapular retractions cues for postural correction x2 sets   Static stand no UE support 30 seconds x2 trials    Standing with RW: horizontal head turns no UE support 5x each direction; very unchallenging   Seated: LAQ 10x each LE 3 second holds.  Seated marches 10x alternating  glute squeeze 10x 3 second hold  Patient tolerated progressive strengthening and  functional ambulation interventions however requires frequent rest breaks due to HR>120 and SOB.  He remains highly motivated despite fatigue throughout session. Pt can continue to benefit from skilled PT treatment so pt can improve functional capacity to complete ADL's.                      PT Education - 05/20/20 1549    Education provided Yes    Education Details exercise technique, body mechanics    Person(s) Educated Patient    Methods Explanation;Demonstration;Tactile cues;Verbal cues    Comprehension Verbalized understanding;Returned demonstration;Verbal cues required;Tactile cues required            PT Short Term Goals - 04/15/20 1644      PT SHORT TERM GOAL #1   Title Pt will be compliant with given HEP to improve LE muscle strength.    Baseline 3/2: Will be given exercises next session.    Time 4    Period Weeks    Status New    Target Date 05/13/20             PT Long Term Goals - 04/15/20 1645      PT LONG TERM GOAL #1   Title Pt will improve FOTO goal to 48 to display perceived improvements in functional mobility.    Baseline 3/2: 40    Time 8    Period Weeks    Status New    Target Date 06/10/20      PT LONG TERM GOAL #2   Title Patient (> 66 years old) will complete five times sit to stand test in < 1 minute indicating an increased LE strength and improved balance.    Baseline 3/2: 1 min, 15.32 sec    Time 8    Period Weeks    Status New    Target Date 06/10/20      PT LONG TERM GOAL #3   Title Patient will deny any near falls over the past 8 weeks to demonstrate improved safety awareness at home and community.    Baseline 3/2: Daily near falls reported with turns and reaching    Time 8    Period Weeks    Status New    Target Date 06/10/20      PT LONG TERM GOAL #4   Title Patient will increase 10 meter walk test to >0.5 m/s as to improve gait speed for better community ambulation and to reduce fall risk.    Baseline 3/2: 35.7  sec or 0.28 m/s    Time 8    Period Weeks    Status New    Target Date 06/10/20                 Plan - 05/20/20 1557  Clinical Impression Statement Patient tolerated progressive strengthening and functional ambulation interventions however requires frequent rest breaks due to HR>120 and SOB.  He remains highly motivated despite fatigue throughout session. Pt can continue to benefit from skilled PT treatment so pt can improve functional capacity to complete ADL's.    Personal Factors and Comorbidities Age;Comorbidity 3+;Past/Current Experience    Comorbidities OPD, A-fib, Aortic Atherosclerosis, ischemic cardiomyopathy, Benign hypertension with chronic kidney disease, stage III (CMS-HCC); Essential hypertension; Chronic bronchitis, unspecified chronic bronchitis type (CMS-HCC); OSA (obstructive sleep apnea); Morbid obesity with BMI of 40.0-44.9, adult (CMS-HCC)    Examination-Activity Limitations Bend;Carry;Squat;Continence;Stand;Transfers;Clinical cytogeneticist Activity;Laundry;Yard Work    Merchant navy officer Evolving/Moderate complexity    Rehab Potential Fair    Clinical Impairments Affecting Rehab Potential Significant cardiopulmonary pathologies.    PT Frequency 2x / week    PT Duration 8 weeks    PT Treatment/Interventions ADLs/Self Care Home Management;Moist Heat;Cryotherapy;Electrical Stimulation;Functional mobility training;Therapeutic activities;Therapeutic exercise;DME Instruction;Gait training;Balance training;Neuromuscular re-education;Patient/family education;Energy conservation;Biofeedback;Ultrasound;Stair training;Orthotic Fit/Training;Manual techniques;Manual lymph drainage;Dry needling;Passive range of motion;Taping;Vestibular;Visual/perceptual remediation/compensation    PT Next Visit Plan Basic standing and seated LE exercises.    PT Home Exercise Plan Establish in next session    Consulted  and Agree with Plan of Care Patient           Patient will benefit from skilled therapeutic intervention in order to improve the following deficits and impairments:  Abnormal gait,Difficulty walking,Cardiopulmonary status limiting activity,Decreased endurance,Impaired UE functional use,Obesity,Decreased activity tolerance,Decreased balance,Improper body mechanics,Decreased mobility,Decreased strength,Impaired sensation,Postural dysfunction,Increased edema,Impaired flexibility,Decreased range of motion  Visit Diagnosis: Muscle weakness (generalized)  Unsteadiness on feet  Other abnormalities of gait and mobility     Problem List Patient Active Problem List   Diagnosis Date Noted  . Atrial fibrillation ([place]) 04/09/2019  . Essential hypertension 04/09/2019  . Venous stasis dermatitis of both lower extremities 04/09/2019  . Lower limb ulcer, ankle, right, limited to breakdown of skin ([place]) 04/09/2019  . Rhabdomyolysis 12/10/2018  . Sepsis ([place]) 07/28/2018  . Cough 03/20/2015  . Bronchitis, chronic obstructive w acute bronchitis ([place]) 03/20/2015  . Umbilical hernia without obstruction and without gangrene 01/12/2015  . Ventral hernia without obstruction or gangrene 01/02/2015   Janna Arch, PT, DPT   05/20/2020, 4:13 PM  [place] MAIN [facility] SERVICES [address] [name], [place], [address] Phone: [phone]   Fax:  [phone]  Name: Daniel Eaton. MRN: [id number] Date of Birth: 09/14/1940

## 2020-05-25 ENCOUNTER — Ambulatory Visit: Payer: HMO

## 2020-05-25 ENCOUNTER — Other Ambulatory Visit: Payer: Self-pay

## 2020-05-25 DIAGNOSIS — M6281 Muscle weakness (generalized): Secondary | ICD-10-CM

## 2020-05-25 DIAGNOSIS — R2689 Other abnormalities of gait and mobility: Secondary | ICD-10-CM

## 2020-05-25 DIAGNOSIS — R2681 Unsteadiness on feet: Secondary | ICD-10-CM

## 2020-05-25 NOTE — Therapy (Signed)
[place] MAIN [facility] SERVICES [address] [place], [place], [address] Phone: [phone]   Fax:  [phone]  Physical Therapy Treatment  Patient Details  Name: Daniel Eaton. MRN: [id number] Date of Birth: Aug 31, 1940 Referring Provider (PT): Dr. [name] MD   Encounter Date: 05/25/2020   PT End of Session - 05/25/20 1445    Visit Number 7    Number of Visits 16    Date for PT Re-Evaluation 06/10/20    PT Start Time 5462    PT Stop Time 7035    PT Time Calculation (min) 38 min    Equipment Utilized During Treatment Gait belt    Activity Tolerance Patient limited by fatigue;No increased pain    Behavior During Therapy WFL for tasks assessed/performed           Past Medical History:  Diagnosis Date  . A-fib ([place])   . Cellulitis   . CHF (congestive heart failure) ([place])   . COPD (chronic obstructive pulmonary disease) ([place])   . Difficult intubation   . Hyperlipidemia   . Hypertension   . Hypothyroidism   . Kidney disease   . Sleep apnea   . Stroke ([place])   . Thyroid disease     Past Surgical History:  Procedure Laterality Date  . CARDIAC CATHETERIZATION    . CATARACT EXTRACTION W/ INTRAOCULAR LENS  IMPLANT, BILATERAL    . COLONOSCOPY  2008  . HERNIA REPAIR     bilateral inguinal hernia/ [place]  . HERNIA REPAIR  02/02/2015   18 x 28 cm ventral light mesh placed laparoscopically.  . TONSILLECTOMY    . UMBILICAL HERNIA REPAIR N/A 02/02/2015   Procedure: HERNIA REPAIR UMBILICAL ADULT;  Surgeon: Robert Bellow, MD;  Location: [facility] ORS;  Service: General;  Laterality: N/A;  . VENTRAL HERNIA REPAIR N/A 02/02/2015   Procedure: LAPAROSCOPIC VENTRAL HERNIA;  Surgeon: Robert Bellow, MD;  Location: [facility] ORS;  Service: General;  Laterality: N/A;  . vp shunt placement  1979    There were no vitals filed for this visit.   Subjective Assessment - 05/25/20 1442    Subjective Patient notes not pain today, just  soreness.  Patient reports no falls or LOB since last session.    Pertinent History Pt is a 80 y.o. male referred to PT for Physical deconditioning. Pt is known to this clinic and has received rehab here in 2019. Pt has a significant PMH of COPD, A-fib, Aortic Atherosclerosis, ischemic cardiomyopathy, benign hypertension with chronic kidney disease, stage III (CMS-HCC); Essential hypertension; Chronic bronchitis, unspecified chronic bronchitis type (CMS-HCC); OSA (obstructive sleep apnea); Morbid obesity with BMI of 40.0-44.9, adult (CMS-HCC) per last cardiology visit. Pt reports being unable to being able to participate in PT during his stay at [facility] this previous October and also tested positive for COVID-19. Once home pt received HH PT up until recently. He completed 2 episodes of OP PT prior to returning to [facility] that were not successful which is why the pt is here at [facility]. Pt reports he is able to stand still for 5 min, can sit for 1 hour comfortably. A fall reported since last October which has put him in his last hospitilzation with daily near falls (1-2x/day) with turning or reaching.  Pt's goal is to focus on gait, balance, LE strength, and walking endurance with RW.    Currently in Pain? No/denies  ThereEx:  Ambulating within // bars, forwards/backwards x5 length of // bars Side-stepping within // bars, cues for foot clearance x4 lengths of // bars Standing hedgehog taps within // bars, 6 taps x 2 each LE Forward ambulation without UE support 2x length of // bars no LOB Sit to stand with airex pad in chair for raised height; multiple attempts throughout session with heavy BUE support required   Seated Ankle/Heel Raises x10 Seated hip adduction with physioball, x15 Seated B knee extension with physioball between calves, x10 Seated B UE rows with RTB, x15   Pt educated throughout session about proper posture and technique with exercises. Improved  exercise technique, movement at target joints, use of target muscles after min to mod verbal, visual, tactile cues   Patient required monitoring of vitals throughout physical therapy session          Patient arrived late to PT session limiting session duration. He is very fatigued throughout session requiring frequent rest breaks but continues to be highly motivated despite fatigue. Progressive mobility interventions tolerated well with patient reporting no increase of pain just fatigue.  Pt can continue to benefit from skilled PT treatment so pt can improve functional capacity to complete ADL's.     PT Education - 05/25/20 1444    Education provided Yes    Education Details exercise technique, body mechanics    Person(s) Educated Patient    Methods Explanation;Demonstration;Tactile cues;Verbal cues    Comprehension Verbalized understanding;Returned demonstration;Verbal cues required;Tactile cues required            PT Short Term Goals - 04/15/20 1644      PT SHORT TERM GOAL #1   Title Pt will be compliant with given HEP to improve LE muscle strength.    Baseline 3/2: Will be given exercises next session.    Time 4    Period Weeks    Status New    Target Date 05/13/20             PT Long Term Goals - 04/15/20 1645      PT LONG TERM GOAL #1   Title Pt will improve FOTO goal to 48 to display perceived improvements in functional mobility.    Baseline 3/2: 40    Time 8    Period Weeks    Status New    Target Date 06/10/20      PT LONG TERM GOAL #2   Title Patient (> 73 years old) will complete five times sit to stand test in < 1 minute indicating an increased LE strength and improved balance.    Baseline 3/2: 1 min, 15.32 sec    Time 8    Period Weeks    Status New    Target Date 06/10/20      PT LONG TERM GOAL #3   Title Patient will deny any near falls over the past 8 weeks to demonstrate improved safety awareness at home and community.    Baseline 3/2: Daily  near falls reported with turns and reaching    Time 8    Period Weeks    Status New    Target Date 06/10/20      PT LONG TERM GOAL #4   Title Patient will increase 10 meter walk test to >0.5 m/s as to improve gait speed for better community ambulation and to reduce fall risk.    Baseline 3/2: 35.7 sec or 0.28 m/s    Time 8    Period Weeks  Status New    Target Date 06/10/20                 Plan - 05/25/20 1448    Clinical Impression Statement Patient arrived late to PT session limiting session duration. He is very fatigued throughout session requiring frequent rest breaks but continues to be highly motivated despite fatigue. Progressive mobility interventions tolerated well with patient reporting no increase of pain just fatigue.  Pt can continue to benefit from skilled PT treatment so pt can improve functional capacity to complete ADL's.    Personal Factors and Comorbidities Age;Comorbidity 3+;Past/Current Experience    Comorbidities OPD, A-fib, Aortic Atherosclerosis, ischemic cardiomyopathy, Benign hypertension with chronic kidney disease, stage III (CMS-HCC); Essential hypertension; Chronic bronchitis, unspecified chronic bronchitis type (CMS-HCC); OSA (obstructive sleep apnea); Morbid obesity with BMI of 40.0-44.9, adult (CMS-HCC)    Examination-Activity Limitations Bend;Carry;Squat;Continence;Stand;Transfers;Clinical cytogeneticist Activity;Laundry;Yard Work    Merchant navy officer Evolving/Moderate complexity    Rehab Potential Fair    Clinical Impairments Affecting Rehab Potential Significant cardiopulmonary pathologies.    PT Frequency 2x / week    PT Duration 8 weeks    PT Treatment/Interventions ADLs/Self Care Home Management;Moist Heat;Cryotherapy;Electrical Stimulation;Functional mobility training;Therapeutic activities;Therapeutic exercise;DME Instruction;Gait training;Balance  training;Neuromuscular re-education;Patient/family education;Energy conservation;Biofeedback;Ultrasound;Stair training;Orthotic Fit/Training;Manual techniques;Manual lymph drainage;Dry needling;Passive range of motion;Taping;Vestibular;Visual/perceptual remediation/compensation    PT Next Visit Plan Basic standing and seated LE exercises.    PT Home Exercise Plan Establish in next session    Consulted and Agree with Plan of Care Patient           Patient will benefit from skilled therapeutic intervention in order to improve the following deficits and impairments:  Abnormal gait,Difficulty walking,Cardiopulmonary status limiting activity,Decreased endurance,Impaired UE functional use,Obesity,Decreased activity tolerance,Decreased balance,Improper body mechanics,Decreased mobility,Decreased strength,Impaired sensation,Postural dysfunction,Increased edema,Impaired flexibility,Decreased range of motion  Visit Diagnosis: Muscle weakness (generalized)  Unsteadiness on feet  Other abnormalities of gait and mobility     Problem List Patient Active Problem List   Diagnosis Date Noted  . Atrial fibrillation ([place]) 04/09/2019  . Essential hypertension 04/09/2019  . Venous stasis dermatitis of both lower extremities 04/09/2019  . Lower limb ulcer, ankle, right, limited to breakdown of skin ([place]) 04/09/2019  . Rhabdomyolysis 12/10/2018  . Sepsis ([place]) 07/28/2018  . Cough 03/20/2015  . Bronchitis, chronic obstructive w acute bronchitis ([place]) 03/20/2015  . Umbilical hernia without obstruction and without gangrene 01/12/2015  . Ventral hernia without obstruction or gangrene 01/02/2015   Janna Arch, PT, DPT   05/25/2020, 4:38 PM  [place] MAIN [facility] SERVICES [address] [place], [place], [address] Phone: [phone]   Fax:  [phone]  Name: Daniel Eaton. MRN: [id number] Date of Birth: [birth year]

## 2020-05-27 ENCOUNTER — Other Ambulatory Visit: Payer: Self-pay

## 2020-05-27 ENCOUNTER — Ambulatory Visit: Payer: HMO

## 2020-05-27 DIAGNOSIS — M6281 Muscle weakness (generalized): Secondary | ICD-10-CM

## 2020-05-27 DIAGNOSIS — R2689 Other abnormalities of gait and mobility: Secondary | ICD-10-CM

## 2020-05-27 DIAGNOSIS — R2681 Unsteadiness on feet: Secondary | ICD-10-CM

## 2020-05-27 NOTE — Therapy (Signed)
[place] MAIN [facility] SERVICES [address] [place], [place], [address] Phone: [phone]   Fax:  [phone]  Physical Therapy Treatment  Patient Details  Name: Daniel Eaton. MRN: [id number] Date of Birth: Oct 12, 1940 Referring Provider (PT): Dr. [name] MD   Encounter Date: 05/27/2020   PT End of Session - 05/27/20 1452    Visit Number 8    Number of Visits 16    Date for PT Re-Evaluation 06/10/20    PT Start Time 5374    PT Stop Time 8270    PT Time Calculation (min) 38 min    Equipment Utilized During Treatment Gait belt    Activity Tolerance Patient limited by fatigue;No increased pain    Behavior During Therapy WFL for tasks assessed/performed           Past Medical History:  Diagnosis Date  . A-fib ([place])   . Cellulitis   . CHF (congestive heart failure) ([place])   . COPD (chronic obstructive pulmonary disease) ([place])   . Difficult intubation   . Hyperlipidemia   . Hypertension   . Hypothyroidism   . Kidney disease   . Sleep apnea   . Stroke ([place])   . Thyroid disease     Past Surgical History:  Procedure Laterality Date  . CARDIAC CATHETERIZATION    . CATARACT EXTRACTION W/ INTRAOCULAR LENS  IMPLANT, BILATERAL    . COLONOSCOPY  2008  . HERNIA REPAIR     bilateral inguinal hernia/ [place]  . HERNIA REPAIR  02/02/2015   18 x 28 cm ventral light mesh placed laparoscopically.  . TONSILLECTOMY    . UMBILICAL HERNIA REPAIR N/A 02/02/2015   Procedure: HERNIA REPAIR UMBILICAL ADULT;  Surgeon: Robert Bellow, MD;  Location: [facility] ORS;  Service: General;  Laterality: N/A;  . VENTRAL HERNIA REPAIR N/A 02/02/2015   Procedure: LAPAROSCOPIC VENTRAL HERNIA;  Surgeon: Robert Bellow, MD;  Location: [facility] ORS;  Service: General;  Laterality: N/A;  . vp shunt placement  1979    There were no vitals filed for this visit.   Subjective Assessment - 05/27/20 1442    Subjective Patient reports severe fatigue after  last session. No falls or LOB since last session    Pertinent History Pt is a 80 y.o. male referred to PT for Physical deconditioning. Pt is known to this clinic and has received rehab here in 2019. Pt has a significant PMH of COPD, A-fib, Aortic Atherosclerosis, ischemic cardiomyopathy, benign hypertension with chronic kidney disease, stage III (CMS-HCC); Essential hypertension; Chronic bronchitis, unspecified chronic bronchitis type (CMS-HCC); OSA (obstructive sleep apnea); Morbid obesity with BMI of 40.0-44.9, adult (CMS-HCC) per last cardiology visit. Pt reports being unable to being able to participate in PT during his stay at [facility] this previous October and also tested positive for COVID-19. Once home pt received HH PT up until recently. He completed 2 episodes of OP PT prior to returning to [facility] that were not successful which is why the pt is here at [facility]. Pt reports he is able to stand still for 5 min, can sit for 1 hour comfortably. A fall reported since last October which has put him in his last hospitilzation with daily near falls (1-2x/day) with turning or reaching.  Pt's goal is to focus on gait, balance, LE strength, and walking endurance with RW.    Currently in Pain? No/denies  Neuro re-ed  airex pad: static stand 30 seconds x 2 trials ; rest break after due to HR> 136  Static stand with horizontal head turns 10x; occasional finger tip support required for stabilization  static stand with vertical head turns with intermittent UE support 10x.   Therex Seated Ankle/Heel Raises x10 Seated hip adduction with physioball, x10x 3 second holds  Seated B UE rows with RTB, x15 Seated abduction RTB 10x, hold 5 seconds   Seated scapular retractions 10x  Seated gluteal squeeze with 5 second hold 10x  Pt educated throughout session about proper posture and technique with exercises. Improved exercise technique, movement at target joints, use of target muscles  after min to mod verbal, visual, tactile cues   Patient required monitoring of vitals throughout physical therapy session            Patient required increased rest breaks this session due to HR elevation and severe soreness from previous session. He remains highly motivated throughout session despite soreness.Supersetting seated with balance interventions allow for increased capacity of intervention tolerance this session.   Pt can continue to benefit from skilled PT treatment so pt can improve functional capacity to complete ADL's.                        PT Education - 05/27/20 1448    Education provided Yes    Education Details exercise technique, body mechanics    Person(s) Educated Patient    Methods Explanation;Demonstration;Tactile cues;Verbal cues    Comprehension Verbalized understanding;Returned demonstration;Verbal cues required;Tactile cues required            PT Short Term Goals - 04/15/20 1644      PT SHORT TERM GOAL #1   Title Pt will be compliant with given HEP to improve LE muscle strength.    Baseline 3/2: Will be given exercises next session.    Time 4    Period Weeks    Status New    Target Date 05/13/20             PT Long Term Goals - 04/15/20 1645      PT LONG TERM GOAL #1   Title Pt will improve FOTO goal to 48 to display perceived improvements in functional mobility.    Baseline 3/2: 40    Time 8    Period Weeks    Status New    Target Date 06/10/20      PT LONG TERM GOAL #2   Title Patient (> 74 years old) will complete five times sit to stand test in < 1 minute indicating an increased LE strength and improved balance.    Baseline 3/2: 1 min, 15.32 sec    Time 8    Period Weeks    Status New    Target Date 06/10/20      PT LONG TERM GOAL #3   Title Patient will deny any near falls over the past 8 weeks to demonstrate improved safety awareness at home and community.    Baseline 3/2: Daily near falls reported with  turns and reaching    Time 8    Period Weeks    Status New    Target Date 06/10/20      PT LONG TERM GOAL #4   Title Patient will increase 10 meter walk test to >0.5 m/s as to improve gait speed for better community ambulation and to reduce fall risk.    Baseline 3/2: 35.7 sec  or 0.28 m/s    Time 8    Period Weeks    Status New    Target Date 06/10/20                 Plan - 05/27/20 1454    Clinical Impression Statement Patient required increased rest breaks this session due to HR elevation and severe soreness from previous session. He remains highly motivated throughout session despite soreness.Supersetting seated with balance interventions allow for increased capacity of intervention tolerance this session.   Pt can continue to benefit from skilled PT treatment so pt can improve functional capacity to complete ADL's.    Personal Factors and Comorbidities Age;Comorbidity 3+;Past/Current Experience    Comorbidities OPD, A-fib, Aortic Atherosclerosis, ischemic cardiomyopathy, Benign hypertension with chronic kidney disease, stage III (CMS-HCC); Essential hypertension; Chronic bronchitis, unspecified chronic bronchitis type (CMS-HCC); OSA (obstructive sleep apnea); Morbid obesity with BMI of 40.0-44.9, adult (CMS-HCC)    Examination-Activity Limitations Bend;Carry;Squat;Continence;Stand;Transfers;Clinical cytogeneticist Activity;Laundry;Yard Work    [employer] officer Evolving/Moderate complexity    Rehab Potential Fair    Clinical Impairments Affecting Rehab Potential Significant cardiopulmonary pathologies.    PT Frequency 2x / week    PT Duration 8 weeks    PT Treatment/Interventions ADLs/Self Care Home Management;Moist Heat;Cryotherapy;Electrical Stimulation;Functional mobility training;Therapeutic activities;Therapeutic exercise;DME Instruction;Gait training;Balance training;Neuromuscular  re-education;Patient/family education;Energy conservation;Biofeedback;Ultrasound;Stair training;Orthotic Fit/Training;Manual techniques;Manual lymph drainage;Dry needling;Passive range of motion;Taping;Vestibular;Visual/perceptual remediation/compensation    PT Next Visit Plan Basic standing and seated LE exercises.    PT Home Exercise Plan Establish in next session    Consulted and Agree with Plan of Care Patient           Patient will benefit from skilled therapeutic intervention in order to improve the following deficits and impairments:  Abnormal gait,Difficulty walking,Cardiopulmonary status limiting activity,Decreased endurance,Impaired UE functional use,Obesity,Decreased activity tolerance,Decreased balance,Improper body mechanics,Decreased mobility,Decreased strength,Impaired sensation,Postural dysfunction,Increased edema,Impaired flexibility,Decreased range of motion  Visit Diagnosis: Muscle weakness (generalized)  Unsteadiness on feet  Other abnormalities of gait and mobility     Problem List Patient Active Problem List   Diagnosis Date Noted  . Atrial fibrillation ([place]) 04/09/2019  . Essential hypertension 04/09/2019  . Venous stasis dermatitis of both lower extremities 04/09/2019  . Lower limb ulcer, ankle, right, limited to breakdown of skin ([place]) 04/09/2019  . Rhabdomyolysis 12/10/2018  . Sepsis ([place]) 07/28/2018  . Cough 03/20/2015  . Bronchitis, chronic obstructive w acute bronchitis ([place]) 03/20/2015  . Umbilical hernia without obstruction and without gangrene 01/12/2015  . Ventral hernia without obstruction or gangrene 01/02/2015   Janna Arch, PT, DPT   05/27/2020, 4:34 PM  [place] MAIN [facility] SERVICES [address] [place], [place], [address] Phone: [phone]   Fax:  [phone]  Name: Daniel Eaton. MRN: [id number] Date of Birth: Jun 16, 1940

## 2020-06-01 ENCOUNTER — Ambulatory Visit: Payer: HMO

## 2020-06-03 ENCOUNTER — Ambulatory Visit: Payer: HMO

## 2020-06-08 ENCOUNTER — Ambulatory Visit: Payer: HMO

## 2020-06-10 ENCOUNTER — Ambulatory Visit: Payer: HMO

## 2020-06-15 ENCOUNTER — Ambulatory Visit: Payer: HMO

## 2020-06-17 ENCOUNTER — Ambulatory Visit: Payer: HMO

## 2020-06-18 DIAGNOSIS — G4733 Obstructive sleep apnea (adult) (pediatric): Secondary | ICD-10-CM | POA: Diagnosis not present

## 2020-06-18 DIAGNOSIS — J449 Chronic obstructive pulmonary disease, unspecified: Secondary | ICD-10-CM | POA: Diagnosis not present

## 2020-06-22 ENCOUNTER — Ambulatory Visit: Payer: HMO

## 2020-06-25 ENCOUNTER — Ambulatory Visit: Payer: HMO

## 2020-06-25 DIAGNOSIS — R5381 Other malaise: Secondary | ICD-10-CM | POA: Diagnosis not present

## 2020-06-25 DIAGNOSIS — I129 Hypertensive chronic kidney disease with stage 1 through stage 4 chronic kidney disease, or unspecified chronic kidney disease: Secondary | ICD-10-CM | POA: Diagnosis not present

## 2020-06-25 DIAGNOSIS — M79674 Pain in right toe(s): Secondary | ICD-10-CM | POA: Diagnosis not present

## 2020-06-25 DIAGNOSIS — I700 Atherosclerosis of aorta: Secondary | ICD-10-CM | POA: Diagnosis not present

## 2020-06-25 DIAGNOSIS — N183 Chronic kidney disease, stage 3 unspecified: Secondary | ICD-10-CM | POA: Diagnosis not present

## 2020-06-25 DIAGNOSIS — I255 Ischemic cardiomyopathy: Secondary | ICD-10-CM | POA: Diagnosis not present

## 2020-06-25 DIAGNOSIS — M79675 Pain in left toe(s): Secondary | ICD-10-CM | POA: Diagnosis not present

## 2020-06-25 DIAGNOSIS — G4733 Obstructive sleep apnea (adult) (pediatric): Secondary | ICD-10-CM | POA: Diagnosis not present

## 2020-06-25 DIAGNOSIS — Z6841 Body Mass Index (BMI) 40.0 and over, adult: Secondary | ICD-10-CM | POA: Diagnosis not present

## 2020-06-25 DIAGNOSIS — I4819 Other persistent atrial fibrillation: Secondary | ICD-10-CM | POA: Diagnosis not present

## 2020-06-25 DIAGNOSIS — B351 Tinea unguium: Secondary | ICD-10-CM | POA: Diagnosis not present

## 2020-06-25 DIAGNOSIS — J42 Unspecified chronic bronchitis: Secondary | ICD-10-CM | POA: Diagnosis not present

## 2020-06-25 DIAGNOSIS — L97511 Non-pressure chronic ulcer of other part of right foot limited to breakdown of skin: Secondary | ICD-10-CM | POA: Diagnosis not present

## 2020-06-30 ENCOUNTER — Ambulatory Visit: Payer: HMO

## 2020-07-02 ENCOUNTER — Ambulatory Visit: Payer: HMO

## 2020-07-07 ENCOUNTER — Ambulatory Visit: Payer: HMO

## 2020-07-09 ENCOUNTER — Ambulatory Visit: Payer: HMO

## 2020-07-14 ENCOUNTER — Ambulatory Visit: Payer: HMO

## 2020-07-15 ENCOUNTER — Other Ambulatory Visit: Payer: Self-pay

## 2020-07-15 ENCOUNTER — Ambulatory Visit: Payer: HMO | Attending: Internal Medicine

## 2020-07-15 DIAGNOSIS — R2681 Unsteadiness on feet: Secondary | ICD-10-CM | POA: Diagnosis not present

## 2020-07-15 DIAGNOSIS — M6281 Muscle weakness (generalized): Secondary | ICD-10-CM | POA: Diagnosis not present

## 2020-07-15 DIAGNOSIS — R2689 Other abnormalities of gait and mobility: Secondary | ICD-10-CM | POA: Insufficient documentation

## 2020-07-15 NOTE — Therapy (Signed)
Daniel Eaton [facility] SERVICES [address] [place], [place], [address] Phone: [phone]   Fax:  [phone]  Physical Therapy Evaluation  Patient Details  Name: Daniel Eaton. MRN: [id number] Date of Birth: March 15, 1940 Referring Provider (PT): Dr. [name] MD   Encounter Date: 07/15/2020   PT End of Session - 07/15/20 1539    Visit Number 1    Number of Visits 24    Date for PT Re-Evaluation 10/07/20    Authorization Type 1/10 eval 6/1    PT Start Time 1259    PT Stop Time 1345    PT Time Calculation (min) 46 min    Equipment Utilized During Treatment Gait belt    Activity Tolerance Patient limited by fatigue;No increased pain    Behavior During Therapy WFL for tasks assessed/performed           Past Medical History:  Diagnosis Date  . A-fib ([place])   . Cellulitis   . CHF (congestive heart failure) ([place])   . COPD (chronic obstructive pulmonary disease) ([place])   . Difficult intubation   . Hyperlipidemia   . Hypertension   . Hypothyroidism   . Kidney disease   . Sleep apnea   . Stroke ([place])   . Thyroid disease     Past Surgical History:  Procedure Laterality Date  . CARDIAC CATHETERIZATION    . CATARACT EXTRACTION W/ INTRAOCULAR LENS  IMPLANT, BILATERAL    . COLONOSCOPY  2008  . HERNIA REPAIR     bilateral inguinal hernia/ [place]  . HERNIA REPAIR  02/02/2015   18 x 28 cm ventral light mesh placed laparoscopically.  . TONSILLECTOMY    . UMBILICAL HERNIA REPAIR N/A 02/02/2015   Procedure: HERNIA REPAIR UMBILICAL ADULT;  Surgeon: Robert Bellow, MD;  Location: [facility] ORS;  Service: General;  Laterality: N/A;  . VENTRAL HERNIA REPAIR N/A 02/02/2015   Procedure: LAPAROSCOPIC VENTRAL HERNIA;  Surgeon: Robert Bellow, MD;  Location: [facility] ORS;  Service: General;  Laterality: N/A;  . vp shunt placement  1979    There were no vitals filed for this visit.    Subjective Assessment - 07/15/20 1255     Subjective Patient is returning to PT for diagnosis of weakness.    Pertinent History Pt is a 80 y.o. male referred to PT for Physical deconditioning. Pt is known to this clinic and has received rehab here in 2019. Pt has a significant PMH of COPD, A-fib, Aortic Atherosclerosis, ischemic cardiomyopathy, benign hypertension with chronic kidney disease, stage III (CMS-HCC); Essential hypertension; Chronic bronchitis, unspecified chronic bronchitis type (CMS-HCC); OSA (obstructive sleep apnea); Morbid obesity with BMI of 40.0-44.9, adult (CMS-HCC) per last cardiology visit. Pt reports being unable to being able to participate in PT during his stay at [facility] this previous October and also tested positive for COVID-19.  fall reported since last October which has put him in his last hospitilzation with daily near falls (1-2x/day) with turning or reaching.  Pt's goal is to focus on gait, balance, LE strength, and walking endurance with RW. Was last seen by this therapist on 05/27/20.    Limitations Standing;House hold activities;Walking;Lifting    How long can you sit comfortably? hour    How long can you stand comfortably? not sure    How long can you walk comfortably? walk with RW to bathroom and back    Patient Stated Goals Improve gait, balance, and LE strength. Reduce risk of falls.  Currently in Pain? No/denies              [facility] PT Assessment - 07/15/20 0001      Assessment   Medical Diagnosis weakness    Referring Provider (PT) Dr. Ouida Sills, Ocie Cornfield MD    Onset Date/Surgical Date 02/14/13    Hand Dominance Right    Prior Therapy Yes      Precautions   Precautions Fall      Restrictions   Weight Bearing Restrictions No      Balance Screen   Has the patient had a decrease in activity level because of a fear of falling?  Yes    Is the patient reluctant to leave their home because of a fear of falling?  Yes      [place] residence     Living Arrangements Spouse/significant other    Available Help at Discharge Family    Type of [place] One level    [place] - 2 wheels;Grab bars - tub/shower;Grab bars - toilet;Toilet riser;Shower seat      Prior Function   Level of Independence Independent with basic ADLs    Leisure play with his dog      Cognition   Overall Cognitive Status Within Functional Limits for tasks assessed                 PAIN: Intermittent pain in bilateral shoulders  POSTURE: excessive forward head rounded shoulders in seated and standing  PROM/AROM:  AROM BLE:  Limited by body habitus   STRENGTH:  Graded on a 0-5 scale Muscle Group Left Right  Hip Flex 3+/5 4-/5  Hip Abd 3+/5 4-/5  Hip Add 3/5 3+/5  Hip Ext 2+/5 3-/5  Hip IR/ER /5 /5  Knee Flex 3+/5 4-/5  Knee Ext 4-/5 4/5  Ankle DF 3/5 3/5  Ankle PF 3/5 3/5    SENSATION:  BUE :  BLE :   NEUROLOGICAL SCREEN: (2+ unless otherwise noted.) N=normal  Ab=abnormal   Level Dermatome R L  C3 Anterior Neck  N N  C4 Top of Shoulder N N  C5 Lateral Upper Arm  N N  C6 Lateral Arm/ Thumb  N N  C7 Middle Finger  N N  C8 4th & 5th Finger N N  T1 Medial Arm N N  L2 Medial thigh/groin AB AB  L3 Lower thigh/med.knee AB AB  L4 Medial leg/lat thigh AB AB  L5 Lat. leg & dorsal foot AB AB  S1 post/lat foot/thigh/leg AB AB  S2 Post./med. thigh & leg AB AB     SOMATOSENSORY:  Any N & T in extremities or weakness: reports :         Sensation           Intact      Diminished         Absent  Light touch LEs                              COORDINATION:          Heel Shin Slide Test: limited by body habitus     FUNCTIONAL MOBILITY: Static stand w/o UE support 1 min 7 seconds  STS requires heavy BUE support   BALANCE: Static Sitting Balance  Normal Able to maintain balance against maximal resistance  Good Able to maintain balance against moderate resistance    Good-/Fair+ Accepts minimal resistance   Fair Able to sit unsupported without balance loss and without UE support   Poor+ Able to maintain with Minimal assistance from individual or chair x  Poor Unable to maintain balance-requires mod/max support from individual or chair    Static Standing Balance  Normal Able to maintain standing balance against maximal resistance   Good Able to maintain standing balance against moderate resistance   Good-/Fair+ Able to maintain standing balance against minimal resistance   Fair Able to stand unsupported without UE support and without LOB for 1-2 min   Fair- Requires Min A and UE support to maintain standing without loss of balance x  Poor+ Requires mod A and UE support to maintain standing without loss of balance   Poor Requires max A and UE support to maintain standing balance without loss    Dynamic Sitting Balance  Normal Able to sit unsupported and weight shift across midline maximally   Good Able to sit unsupported and weight shift across midline moderately   Good-/Fair+ Able to sit unsupported and weight shift across midline minimally   Fair Minimal weight shifting ipsilateral/front, difficulty crossing midline x  Fair- Reach to ipsilateral side and unable to weight shift   Poor + Able to sit unsupported with min A and reach to ipsilateral side, unable to weight shift   Poor Able to sit unsupported with mod A and reach ipsilateral/front-can't cross midline    Standing Dynamic Balance  Normal Stand independently unsupported, able to weight shift and cross midline maximally   Good Stand independently unsupported, able to weight shift and cross midline moderately   Good-/Fair+ Stand independently unsupported, able to weight shift across midline minimally   Fair Stand independently unsupported, weight shift, and reach ipsilaterally, loss of balance when crossing midline   Poor+ Able to stand with Min A and reach ipsilaterally, unable to weight  shift x  Poor Able to stand with Mod A and minimally reach ipsilaterally, unable to cross midline.      GAIT:  Significant decreased gait speed with RW use. Heavy reliance on UE's with limited B knee/hip flexion and hip extension with poor B ankle rocker.    OUTCOME MEASURES: TEST Outcome Interpretation  5 times sit<>stand 1 min 31 sec heavy BUE support >84 yo, >15 sec indicates increased risk for falls  10 meter walk test           24 seconds with RW    0.41  m/s <1.0 m/s indicates increased risk for falls; limited community ambulator          Chartered loss adjuster  <36/56 (100% risk for falls), 37-45 (80% risk for falls); 46-51 (>50% risk for falls); 52-55 (lower risk <25% of falls)  FOTO 40 Predicted discharge score of 48%        Access Code: [email or web address] URL: [email or web address] Date: 07/15/2020 Prepared by: Janna Arch  Exercises Supine Hip Adduction Isometric with Diona Foley - 1 x daily - 7 x weekly - 2 sets - 10 reps - 5 hold    Objective measurements completed on examination: See above findings.    Treatment: RTB adduction 10x each LE Alternating toe taps to RTB on floor 10x each LE for coordination, spatial awareness, and sequencing.   Patient has returned to physical therapy after bout of cellulitis for generalized weakness. Was unable to have home health come to his house so has been doing his own  program at home. Patient's LLE is weaker than his right and he requires heavy UE support for transfers and ambulation. Heart rate elevation noted with interventions as well as shortness of breath. Patient will benefit from skilled physical therapy to improve functional capacity of mobility, strength, and decrease fall risk to improve quality of life.          PT Education - 07/15/20 1311    Education provided Yes    Education Details goals, POC    Person(s) Educated Patient    Methods Explanation;Demonstration;Tactile cues;Verbal cues    Comprehension  Verbalized understanding;Returned demonstration;Verbal cues required;Tactile cues required            PT Short Term Goals - 07/15/20 1546      PT SHORT TERM GOAL #1   Title Pt will be compliant with given HEP to improve LE muscle strength.    Baseline 6/1: HEP added to    Time 6    Period Weeks    Status New    Target Date 08/26/20      PT SHORT TERM GOAL #2   Title Patient will stand from standard height chair on first attempt to allow for improved mobility.     Baseline 6/1: unable to perform    Time 6    Period Weeks    Status New    Target Date 08/26/20             PT Long Term Goals - 07/15/20 1319      PT LONG TERM GOAL #1   Title Pt will improve FOTO goal to 48 to display perceived improvements in functional mobility.    Baseline 6/1: 40%    Time 12    Period Weeks    Status New    Target Date 10/07/20      PT LONG TERM GOAL #2   Title Patient (> 39 years old) will complete five times sit to stand test in < 1 minute indicating an increased LE strength and improved balance.    Baseline 6/1: 1 min 31 seconds with heavy BUE support    Time 12    Period Weeks    Status New    Target Date 10/07/20      PT LONG TERM GOAL #3   Title Patient will increase BLE gross strength to 4+/5 as to improve functional strength for independent gait, increased standing tolerance and increased ADL ability.    Baseline 6/1: see note    Time 12    Period Weeks    Status New    Target Date 10/07/20      PT LONG TERM GOAL #4   Title Patient will increase 10 meter walk test to >1.0 m/s as to improve gait speed for better community ambulation and to reduce fall risk.    Baseline 6/1: 0.41 m/s with RW    Time 12    Period Weeks    Status New    Target Date 10/07/20      PT LONG TERM GOAL #5   Title Patient will stand without UE support >3 minutes for ADL performance and increased stability with independent movement.    Baseline 6/1: 1 min 7 seconds    Time 12    Period  Weeks    Status New    Target Date 10/07/20                  Plan - 07/15/20 1545    Clinical Impression  Statement Patient has returned to physical therapy after bout of cellulitis for generalized weakness. Was unable to have home health come to his house so has been doing his own program at home. Patient's LLE is weaker than his right and he requires heavy UE support for transfers and ambulation. Heart rate elevation noted with interventions as well as shortness of breath. Patient will benefit from skilled physical therapy to improve functional capacity of mobility, strength, and decrease fall risk to improve quality of life.    Personal Factors and Comorbidities Age;Comorbidity 3+;Past/Current Experience;Fitness    Comorbidities OPD, A-fib, Aortic Atherosclerosis, ischemic cardiomyopathy, Benign hypertension with chronic kidney disease, stage III (CMS-HCC); Essential hypertension; Chronic bronchitis, unspecified chronic bronchitis type (CMS-HCC); OSA (obstructive sleep apnea); Morbid obesity with BMI of 40.0-44.9, adult (CMS-HCC)    Examination-Activity Limitations [place];Carry;Squat;Continence;Stand;Transfers;Locomotion Level;Reach Overhead;Bed Mobility;Lift;Stairs;Toileting;Dressing    Examination-Participation Restrictions Community Activity;Laundry;Yard Work;Cleaning;Driving;Meal Prep;Shop    Stability/Clinical Decision Making Evolving/Moderate complexity    Clinical Decision Making Moderate    Rehab Potential Fair    PT Frequency 2x / week    PT Duration 12 weeks    PT Treatment/Interventions ADLs/Self Care Home Management;Moist Heat;Cryotherapy;Electrical Stimulation;Functional mobility training;Therapeutic activities;Therapeutic exercise;DME Instruction;Gait training;Balance training;Neuromuscular re-education;Patient/family education;Energy conservation;Biofeedback;Ultrasound;Stair training;Orthotic Fit/Training;Manual techniques;Manual lymph drainage;Dry needling;Passive range of  motion;Taping;Vestibular;Visual/perceptual remediation/compensation    PT Next Visit Plan Basic standing and seated LE exercises.    Recommended Other Services n/a    Consulted and Agree with Plan of Care Patient           Patient will benefit from skilled therapeutic intervention in order to improve the following deficits and impairments:  Abnormal gait,Difficulty walking,Cardiopulmonary status limiting activity,Decreased endurance,Impaired UE functional use,Obesity,Decreased activity tolerance,Decreased balance,Improper body mechanics,Decreased mobility,Decreased strength,Impaired sensation,Postural dysfunction,Increased edema,Impaired flexibility,Decreased range of motion,Decreased coordination,Pain  Visit Diagnosis: Muscle weakness (generalized)  Unsteadiness on feet  Other abnormalities of gait and mobility     Problem List Patient Active Problem List   Diagnosis Date Noted  . Atrial fibrillation ([place]) 04/09/2019  . Essential hypertension 04/09/2019  . Venous stasis dermatitis of both lower extremities 04/09/2019  . Lower limb ulcer, ankle, right, limited to breakdown of skin ([place]) 04/09/2019  . Rhabdomyolysis 12/10/2018  . Sepsis ([place]) 07/28/2018  . Cough 03/20/2015  . Bronchitis, chronic obstructive w acute bronchitis ([place]) 03/20/2015  . Umbilical hernia without obstruction and without gangrene 01/12/2015  . Ventral hernia without obstruction or gangrene 01/02/2015   Janna Arch, PT, DPT    07/15/2020, 3:46 PM  [place] [facility] SERVICES [address] [place], [place], [address] Phone: [phone]   Fax:  [phone]  Name: Elba Dendinger. MRN: [id number] Date of Birth: 1940/10/12

## 2020-07-16 ENCOUNTER — Ambulatory Visit: Payer: HMO

## 2020-07-20 ENCOUNTER — Other Ambulatory Visit: Payer: Self-pay

## 2020-07-20 ENCOUNTER — Ambulatory Visit: Payer: HMO

## 2020-07-20 DIAGNOSIS — R2689 Other abnormalities of gait and mobility: Secondary | ICD-10-CM

## 2020-07-20 DIAGNOSIS — M6281 Muscle weakness (generalized): Secondary | ICD-10-CM | POA: Diagnosis not present

## 2020-07-20 DIAGNOSIS — R2681 Unsteadiness on feet: Secondary | ICD-10-CM

## 2020-07-20 NOTE — Therapy (Signed)
[place] MAIN [facility] SERVICES [address] [place], [place], [address] Phone: [phone]   Fax:  [phone]  Physical Therapy Treatment  Patient Details  Name: Daniel Eaton. MRN: [id number] Date of Birth: 1940/12/05 Referring Provider (PT): Dr. [name] MD   Encounter Date: 07/20/2020   PT End of Session - 07/20/20 1412    Visit Number 2    Number of Visits 24    Date for PT Re-Evaluation 10/07/20    Authorization Type 2/10 eval 6/1    PT Start Time 1352    PT Stop Time 1436    PT Time Calculation (min) 44 min    Equipment Utilized During Treatment Gait belt    Activity Tolerance Patient limited by fatigue;No increased pain    Behavior During Therapy WFL for tasks assessed/performed           Past Medical History:  Diagnosis Date  . A-fib ([place])   . Cellulitis   . CHF (congestive heart failure) ([place])   . COPD (chronic obstructive pulmonary disease) ([place])   . Difficult intubation   . Hyperlipidemia   . Hypertension   . Hypothyroidism   . Kidney disease   . Sleep apnea   . Stroke ([place])   . Thyroid disease     Past Surgical History:  Procedure Laterality Date  . CARDIAC CATHETERIZATION    . CATARACT EXTRACTION W/ INTRAOCULAR LENS  IMPLANT, BILATERAL    . COLONOSCOPY  2008  . HERNIA REPAIR     bilateral inguinal hernia/ [place]  . HERNIA REPAIR  02/02/2015   18 x 28 cm ventral light mesh placed laparoscopically.  . TONSILLECTOMY    . UMBILICAL HERNIA REPAIR N/A 02/02/2015   Procedure: HERNIA REPAIR UMBILICAL ADULT;  Surgeon: Robert Bellow, MD;  Location: [facility] ORS;  Service: General;  Laterality: N/A;  . VENTRAL HERNIA REPAIR N/A 02/02/2015   Procedure: LAPAROSCOPIC VENTRAL HERNIA;  Surgeon: Robert Bellow, MD;  Location: [facility] ORS;  Service: General;  Laterality: N/A;  . vp shunt placement  1979    There were no vitals filed for this visit.   Subjective Assessment - 07/20/20 1440    Subjective  Patient reports he was very sore after evaluation but has been compliant with his HEP daily.    Pertinent History Pt is a 80 y.o. male referred to PT for Physical deconditioning. Pt is known to this clinic and has received rehab here in 2019. Pt has a significant PMH of COPD, A-fib, Aortic Atherosclerosis, ischemic cardiomyopathy, benign hypertension with chronic kidney disease, stage III (CMS-HCC); Essential hypertension; Chronic bronchitis, unspecified chronic bronchitis type (CMS-HCC); OSA (obstructive sleep apnea); Morbid obesity with BMI of 40.0-44.9, adult (CMS-HCC) per last cardiology visit. Pt reports being unable to being able to participate in PT during his stay at [facility] this previous October and also tested positive for COVID-19.  fall reported since last October which has put him in his last hospitilzation with daily near falls (1-2x/day) with turning or reaching.  Pt's goal is to focus on gait, balance, LE strength, and walking endurance with RW. Was last seen by this therapist on 05/27/20.    Limitations Standing;House hold activities;Walking;Lifting    How long can you sit comfortably? hour    How long can you stand comfortably? not sure    How long can you walk comfortably? walk with RW to bathroom and back    Patient Stated Goals Improve gait, balance, and  LE strength. Reduce risk of falls.    Currently in Pain? No/denies                 Ambulate 60 ft with RW; L leg challenged with stabilization, decreased hip flexion noted with fatigue ; HR 115 Seated: heel toe raise 15x Seated: alternating LAQ 10x each LE  Ambulate 60 ft with RW; L leg challenged with stabilization, decreased hip flexion noted with fatigue : HR 124 Seated: scapular retractions 15x Seated: single limb march 10x each LE    Ambulate 60 ft with RW; L leg challenged with stabilization, decreased hip flexion noted with fatigue : HR 121 Seated: glute squeeze 10x 3 second hold Seated: theraband  taps for coordination, spatial awareness and sequencing 30 seconds x2 trials ; improved coordination with repettition  Standing with RW: Horizontal head turns no UE support 10x ; challenging for patient.   Seated RTB adduction 10x each LE   Pt educated throughout session about proper posture and technique with exercises. Improved exercise technique, movement at target joints, use of target muscles after min to mod verbal, visual, tactile cues   Although patient arrived late to session he was kept later to ensure a full session. He is highly motivated throughout physical therapy session. His left leg fatigues quicker than his right and has decreased coordination and active range of motion which will continue to be an area for progress. Patient will benefit from skilled physical therapy to improve functional capacity of mobility, strength, and decrease fall risk to improve quality of life.                    PT Education - 07/20/20 1414    Education provided Yes    Education Details exercise technique, body mechanics    Person(s) Educated Patient    Methods Explanation;Tactile cues;Demonstration;Verbal cues    Comprehension Verbalized understanding;Returned demonstration;Verbal cues required;Tactile cues required            PT Short Term Goals - 07/15/20 1546      PT SHORT TERM GOAL #1   Title Pt will be compliant with given HEP to improve LE muscle strength.    Baseline 6/1: HEP added to    Time 6    Period Weeks    Status New    Target Date 08/26/20      PT SHORT TERM GOAL #2   Title Patient will stand from standard height chair on first attempt to allow for improved mobility.     Baseline 6/1: unable to perform    Time 6    Period Weeks    Status New    Target Date 08/26/20             PT Long Term Goals - 07/15/20 1319      PT LONG TERM GOAL #1   Title Pt will improve FOTO goal to 48 to display perceived improvements in functional mobility.     Baseline 6/1: 40%    Time 12    Period Weeks    Status New    Target Date 10/07/20      PT LONG TERM GOAL #2   Title Patient (> 50 years old) will complete five times sit to stand test in < 1 minute indicating an increased LE strength and improved balance.    Baseline 6/1: 1 min 31 seconds with heavy BUE support    Time 12    Period Weeks    Status New  Target Date 10/07/20      PT LONG TERM GOAL #3   Title Patient will increase BLE gross strength to 4+/5 as to improve functional strength for independent gait, increased standing tolerance and increased ADL ability.    Baseline 6/1: see note    Time 12    Period Weeks    Status New    Target Date 10/07/20      PT LONG TERM GOAL #4   Title Patient will increase 10 meter walk test to >1.0 m/s as to improve gait speed for better community ambulation and to reduce fall risk.    Baseline 6/1: 0.41 m/s with RW    Time 12    Period Weeks    Status New    Target Date 10/07/20      PT LONG TERM GOAL #5   Title Patient will stand without UE support >3 minutes for ADL performance and increased stability with independent movement.    Baseline 6/1: 1 min 7 seconds    Time 12    Period Weeks    Status New    Target Date 10/07/20                 Plan - 07/20/20 1448    Clinical Impression Statement Although patient arrived late to session he was kept later to ensure a full session. He is highly motivated throughout physical therapy session. His left leg fatigues quicker than his right and has decreased coordination and active range of motion which will continue to be an area for progress. Patient will benefit from skilled physical therapy to improve functional capacity of mobility, strength, and decrease fall risk to improve quality of life.    Personal Factors and Comorbidities Age;Comorbidity 3+;Past/Current Experience;Fitness    Comorbidities OPD, A-fib, Aortic Atherosclerosis, ischemic cardiomyopathy, Benign hypertension with  chronic kidney disease, stage III (CMS-HCC); Essential hypertension; Chronic bronchitis, unspecified chronic bronchitis type (CMS-HCC); OSA (obstructive sleep apnea); Morbid obesity with BMI of 40.0-44.9, adult (CMS-HCC)    Examination-Activity Limitations Bend;Carry;Squat;Continence;Stand;Transfers;Locomotion Level;Reach Overhead;Bed Mobility;Lift;Stairs;Toileting;Dressing    Examination-Participation Restrictions Community Activity;Laundry;Yard Work;Cleaning;Driving;Meal Prep;Shop    Stability/Clinical Decision Making Evolving/Moderate complexity    Rehab Potential Fair    PT Frequency 2x / week    PT Duration 12 weeks    PT Treatment/Interventions ADLs/Self Care Home Management;Moist Heat;Cryotherapy;Electrical Stimulation;Functional mobility training;Therapeutic activities;Therapeutic exercise;DME Instruction;Gait training;Balance training;Neuromuscular re-education;Patient/family education;Energy conservation;Biofeedback;Ultrasound;Stair training;Orthotic Fit/Training;Manual techniques;Manual lymph drainage;Dry needling;Passive range of motion;Taping;Vestibular;Visual/perceptual remediation/compensation    PT Next Visit Plan Basic standing and seated LE exercises.    Consulted and Agree with Plan of Care Patient           Patient will benefit from skilled therapeutic intervention in order to improve the following deficits and impairments:  Abnormal gait,Difficulty walking,Cardiopulmonary status limiting activity,Decreased endurance,Impaired UE functional use,Obesity,Decreased activity tolerance,Decreased balance,Improper body mechanics,Decreased mobility,Decreased strength,Impaired sensation,Postural dysfunction,Increased edema,Impaired flexibility,Decreased range of motion,Decreased coordination,Pain  Visit Diagnosis: Muscle weakness (generalized)  Unsteadiness on feet  Other abnormalities of gait and mobility     Problem List Patient Active Problem List   Diagnosis Date Noted  .  Atrial fibrillation ([place]) 04/09/2019  . Essential hypertension 04/09/2019  . Venous stasis dermatitis of both lower extremities 04/09/2019  . Lower limb ulcer, ankle, right, limited to breakdown of skin ([place]) 04/09/2019  . Rhabdomyolysis 12/10/2018  . Sepsis ([place]) 07/28/2018  . Cough 03/20/2015  . Bronchitis, chronic obstructive w acute bronchitis ([place]) 03/20/2015  . Umbilical hernia without obstruction and without gangrene 01/12/2015  .  Ventral hernia without obstruction or gangrene 01/02/2015   Janna Arch, PT, DPT   07/20/2020, 2:49 PM  [place] MAIN [facility] SERVICES [address] [place], [place], [address] Phone: [phone]   Fax:  [phone]  Name: Carston Riedl. MRN: [id number] Date of Birth: 08-30-1940

## 2020-07-22 ENCOUNTER — Ambulatory Visit: Payer: HMO

## 2020-07-22 ENCOUNTER — Other Ambulatory Visit: Payer: Self-pay

## 2020-07-22 DIAGNOSIS — R2681 Unsteadiness on feet: Secondary | ICD-10-CM

## 2020-07-22 DIAGNOSIS — R2689 Other abnormalities of gait and mobility: Secondary | ICD-10-CM

## 2020-07-22 DIAGNOSIS — M6281 Muscle weakness (generalized): Secondary | ICD-10-CM

## 2020-07-22 NOTE — Therapy (Signed)
[place] MAIN [facility] SERVICES [address] [place], [place], [address] Phone: [phone]   Fax:  [phone]  Physical Therapy Treatment  Patient Details  Name: Daniel Eaton. MRN: [id number] Date of Birth: April 04, 1940 Referring Provider (PT): Dr. [name] MD   Encounter Date: 07/22/2020   PT End of Session - 07/22/20 1140    Visit Number 3    Number of Visits 24    Date for PT Re-Evaluation 10/07/20    Authorization Type 3/10 eval 6/1    PT Start Time 1122    PT Stop Time 1200    PT Time Calculation (min) 38 min    Equipment Utilized During Treatment Gait belt    Activity Tolerance Patient limited by fatigue;No increased pain    Behavior During Therapy WFL for tasks assessed/performed           Past Medical History:  Diagnosis Date  . A-fib ([place])   . Cellulitis   . CHF (congestive heart failure) ([place])   . COPD (chronic obstructive pulmonary disease) ([place])   . Difficult intubation   . Hyperlipidemia   . Hypertension   . Hypothyroidism   . Kidney disease   . Sleep apnea   . Stroke ([place])   . Thyroid disease     Past Surgical History:  Procedure Laterality Date  . CARDIAC CATHETERIZATION    . CATARACT EXTRACTION W/ INTRAOCULAR LENS  IMPLANT, BILATERAL    . COLONOSCOPY  2008  . HERNIA REPAIR     bilateral inguinal hernia/ [place]  . HERNIA REPAIR  02/02/2015   18 x 28 cm ventral light mesh placed laparoscopically.  . TONSILLECTOMY    . UMBILICAL HERNIA REPAIR N/A 02/02/2015   Procedure: HERNIA REPAIR UMBILICAL ADULT;  Surgeon: Robert Bellow, MD;  Location: [facility] ORS;  Service: General;  Laterality: N/A;  . VENTRAL HERNIA REPAIR N/A 02/02/2015   Procedure: LAPAROSCOPIC VENTRAL HERNIA;  Surgeon: Robert Bellow, MD;  Location: [facility] ORS;  Service: General;  Laterality: N/A;  . vp shunt placement  1979    There were no vitals filed for this visit.   Subjective Assessment - 07/22/20 1131    Subjective  Patient compliant with HEP, was sore after last session. No falls or LOB since last session.    Pertinent History Pt is a 80 y.o. male referred to PT for Physical deconditioning. Pt is known to this clinic and has received rehab here in 2019. Pt has a significant PMH of COPD, A-fib, Aortic Atherosclerosis, ischemic cardiomyopathy, benign hypertension with chronic kidney disease, stage III (CMS-HCC); Essential hypertension; Chronic bronchitis, unspecified chronic bronchitis type (CMS-HCC); OSA (obstructive sleep apnea); Morbid obesity with BMI of 40.0-44.9, adult (CMS-HCC) per last cardiology visit. Pt reports being unable to being able to participate in PT during his stay at [facility] this previous October and also tested positive for COVID-19.  Pt's goal is to focus on gait, balance, LE strength, and walking endurance with RW. Was last seen by this therapist on 05/27/20.    Limitations Standing;House hold activities;Walking;Lifting    How long can you sit comfortably? hour    How long can you stand comfortably? not sure    How long can you walk comfortably? walk with RW to bathroom and back    Patient Stated Goals Improve gait, balance, and LE strength. Reduce risk of falls.    Currently in Pain? No/denies  Treatment: Nustep Lvl 1-2, seat position 13, arms at 11; RPM> 60; 4 minutes     in // bars:   Ambulating within // bars, forwards/backwards x4 length of // bars; patient reports as easy/medium challenge but breathing is affected.   Side-stepping within // bars, cues for foot clearance x4 lengths of // bars; extreme shortness of breathe requiring standing break between reps   Standing hedgehog taps within // bars, 3 hedgehogs; 4 x each LE; heavy BUE support   6" step toe taps 8x each LE; heavy BUE support  seated: Scapular retraction 10x; 3 sets throughout session Seated LAQ 10x each LE;   Pt educated throughout session about proper posture and technique with  exercises. Improved exercise technique, movement at target joints, use of target muscles after min to mod verbal, visual, tactile cues   Patient presents with diffuse shortness of breath throughout session requiring increased standing and seated breaks. Scapular retraction and breathing techniques improved quality of breath. Sp02 remains >90% despite shortness of breath. Patient remains highly motivated throughout session. Patient will benefit from skilled physical therapy to improve functional capacity of mobility, strength, and decrease fall risk to improve quality of life.                PT Education - 07/22/20 1132    Education provided Yes    Education Details exercise technique, body mechanics    Person(s) Educated Patient    Methods Explanation;Demonstration;Tactile cues;Verbal cues    Comprehension Verbalized understanding;Returned demonstration;Verbal cues required;Tactile cues required            PT Short Term Goals - 07/15/20 1546      PT SHORT TERM GOAL #1   Title Pt will be compliant with given HEP to improve LE muscle strength.    Baseline 6/1: HEP added to    Time 6    Period Weeks    Status New    Target Date 08/26/20      PT SHORT TERM GOAL #2   Title Patient will stand from standard height chair on first attempt to allow for improved mobility.     Baseline 6/1: unable to perform    Time 6    Period Weeks    Status New    Target Date 08/26/20             PT Long Term Goals - 07/15/20 1319      PT LONG TERM GOAL #1   Title Pt will improve FOTO goal to 48 to display perceived improvements in functional mobility.    Baseline 6/1: 40%    Time 12    Period Weeks    Status New    Target Date 10/07/20      PT LONG TERM GOAL #2   Title Patient (> 44 years old) will complete five times sit to stand test in < 1 minute indicating an increased LE strength and improved balance.    Baseline 6/1: 1 min 31 seconds with heavy BUE support    Time 12     Period Weeks    Status New    Target Date 10/07/20      PT LONG TERM GOAL #3   Title Patient will increase BLE gross strength to 4+/5 as to improve functional strength for independent gait, increased standing tolerance and increased ADL ability.    Baseline 6/1: see note    Time 12    Period Weeks    Status New  Target Date 10/07/20      PT LONG TERM GOAL #4   Title Patient will increase 10 meter walk test to >1.0 m/s as to improve gait speed for better community ambulation and to reduce fall risk.    Baseline 6/1: 0.41 m/s with RW    Time 12    Period Weeks    Status New    Target Date 10/07/20      PT LONG TERM GOAL #5   Title Patient will stand without UE support >3 minutes for ADL performance and increased stability with independent movement.    Baseline 6/1: 1 min 7 seconds    Time 12    Period Weeks    Status New    Target Date 10/07/20                 Plan - 07/22/20 1155    Clinical Impression Statement Patient presents with diffuse shortness of breath throughout session requiring increased standing and seated breaks. Scapular retraction and breathing techniques improved quality of breath. Sp02 remains >90% despite shortness of breath. Patient remains highly motivated throughout session. Patient will benefit from skilled physical therapy to improve functional capacity of mobility, strength, and decrease fall risk to improve quality of life.    Personal Factors and Comorbidities Age;Comorbidity 3+;Past/Current Experience;Fitness    Comorbidities OPD, A-fib, Aortic Atherosclerosis, ischemic cardiomyopathy, Benign hypertension with chronic kidney disease, stage III (CMS-HCC); Essential hypertension; Chronic bronchitis, unspecified chronic bronchitis type (CMS-HCC); OSA (obstructive sleep apnea); Morbid obesity with BMI of 40.0-44.9, adult (CMS-HCC)    Examination-Activity Limitations Bend;Carry;Squat;Continence;Stand;Transfers;Locomotion Level;Reach Overhead;Bed  Mobility;Lift;Stairs;Toileting;Dressing    Examination-Participation Restrictions Community Activity;Laundry;Yard Work;Cleaning;Driving;Meal Prep;Shop    Stability/Clinical Decision Making Evolving/Moderate complexity    Rehab Potential Fair    PT Frequency 2x / week    PT Duration 12 weeks    PT Treatment/Interventions ADLs/Self Care Home Management;Moist Heat;Cryotherapy;Electrical Stimulation;Functional mobility training;Therapeutic activities;Therapeutic exercise;DME Instruction;Gait training;Balance training;Neuromuscular re-education;Patient/family education;Energy conservation;Biofeedback;Ultrasound;Stair training;Orthotic Fit/Training;Manual techniques;Manual lymph drainage;Dry needling;Passive range of motion;Taping;Vestibular;Visual/perceptual remediation/compensation    PT Next Visit Plan Basic standing and seated LE exercises.    Consulted and Agree with Plan of Care Patient           Patient will benefit from skilled therapeutic intervention in order to improve the following deficits and impairments:  Abnormal gait,Difficulty walking,Cardiopulmonary status limiting activity,Decreased endurance,Impaired UE functional use,Obesity,Decreased activity tolerance,Decreased balance,Improper body mechanics,Decreased mobility,Decreased strength,Impaired sensation,Postural dysfunction,Increased edema,Impaired flexibility,Decreased range of motion,Decreased coordination,Pain  Visit Diagnosis: Muscle weakness (generalized)  Unsteadiness on feet  Other abnormalities of gait and mobility     Problem List Patient Active Problem List   Diagnosis Date Noted  . Atrial fibrillation ([place]) 04/09/2019  . Essential hypertension 04/09/2019  . Venous stasis dermatitis of both lower extremities 04/09/2019  . Lower limb ulcer, ankle, right, limited to breakdown of skin ([place]) 04/09/2019  . Rhabdomyolysis 12/10/2018  . Sepsis ([place]) 07/28/2018  . Cough 03/20/2015  . Bronchitis, chronic obstructive  w acute bronchitis ([place]) 03/20/2015  . Umbilical hernia without obstruction and without gangrene 01/12/2015  . Ventral hernia without obstruction or gangrene 01/02/2015   Janna Arch, PT, DPT   07/22/2020, 12:05 PM  [place] MAIN [facility] SERVICES [address] [place], [place], [address] Phone: [phone]   Fax:  [phone]  Name: Daniel Eaton. MRN: [id number] Date of Birth: Sep 13, 1940

## 2020-07-27 ENCOUNTER — Ambulatory Visit: Payer: HMO

## 2020-07-29 ENCOUNTER — Ambulatory Visit: Payer: HMO

## 2020-08-03 ENCOUNTER — Ambulatory Visit: Payer: HMO

## 2020-08-03 ENCOUNTER — Other Ambulatory Visit: Payer: Self-pay

## 2020-08-03 DIAGNOSIS — R2689 Other abnormalities of gait and mobility: Secondary | ICD-10-CM

## 2020-08-03 DIAGNOSIS — R2681 Unsteadiness on feet: Secondary | ICD-10-CM

## 2020-08-03 DIAGNOSIS — M6281 Muscle weakness (generalized): Secondary | ICD-10-CM | POA: Diagnosis not present

## 2020-08-03 NOTE — Therapy (Signed)
Daniel Eaton [facility] SERVICES [address] [place], [place], [address] Phone: [phone]   Fax:  [phone]  Physical Therapy Treatment  Patient Details  Name: Daniel Eaton. MRN: [id number] Date of Birth: Jun 24, 1940 Referring Provider (PT): Dr. [name] MD   Encounter Date: 08/03/2020   PT End of Session - 08/03/20 1140     Visit Number 4    Number of Visits 24    Date for PT Re-Evaluation 10/07/20    Authorization Type 4/10 eval 6/1    PT Start Time 1119    PT Stop Time 1200    PT Time Calculation (min) 41 min    Equipment Utilized During Treatment Gait belt    Activity Tolerance Patient limited by fatigue;No increased pain    Behavior During Therapy WFL for tasks assessed/performed             Past Medical History:  Diagnosis Date   A-fib ([place])    Cellulitis    CHF (congestive heart failure) (HCC)    COPD (chronic obstructive pulmonary disease) ([place])    Difficult intubation    Hyperlipidemia    Hypertension    Hypothyroidism    Kidney disease    Sleep apnea    Stroke [facility])    Thyroid disease     Past Surgical History:  Procedure Laterality Date   CARDIAC CATHETERIZATION     CATARACT EXTRACTION W/ INTRAOCULAR LENS  IMPLANT, BILATERAL     COLONOSCOPY  2008   HERNIA REPAIR     bilateral inguinal hernia/ [place]   HERNIA REPAIR  02/02/2015   18 x 28 cm ventral light mesh placed laparoscopically.   TONSILLECTOMY     UMBILICAL HERNIA REPAIR N/A 02/02/2015   Procedure: HERNIA REPAIR UMBILICAL ADULT;  Surgeon: Robert Bellow, MD;  Location: [facility] ORS;  Service: General;  Laterality: N/A;   VENTRAL HERNIA REPAIR N/A 02/02/2015   Procedure: LAPAROSCOPIC VENTRAL HERNIA;  Surgeon: Robert Bellow, MD;  Location: [facility] ORS;  Service: General;  Laterality: N/A;   vp shunt placement  1979    There were no vitals filed for this visit.   Subjective Assessment - 08/03/20 1127     Subjective Patient  reports his cellulitis came back in bilateral legs. Has a bacterial infection of finger on L hand. Is on antibiotics for it.    Pertinent History Pt is a 80 y.o. male referred to PT for Physical deconditioning. Pt is known to this clinic and has received rehab here in 2019. Pt has a significant PMH of COPD, A-fib, Aortic Atherosclerosis, ischemic cardiomyopathy, benign hypertension with chronic kidney disease, stage III (CMS-HCC); Essential hypertension; Chronic bronchitis, unspecified chronic bronchitis type (CMS-HCC); OSA (obstructive sleep apnea); Morbid obesity with BMI of 40.0-44.9, adult (CMS-HCC) per last cardiology visit. Pt reports being unable to being able to participate in PT during his stay at [facility] this previous October and also tested positive for COVID-19.  Pt's goal is to focus on gait, balance, LE strength, and walking endurance with RW. Was last seen by this therapist on 05/27/20.    Limitations Standing;House hold activities;Walking;Lifting    How long can you sit comfortably? hour    How long can you stand comfortably? not sure    How long can you walk comfortably? walk with RW to bathroom and back    Patient Stated Goals Improve gait, balance, and LE strength. Reduce risk of falls.    Currently in Pain?  Yes    Pain Score 4     Pain Location Leg    Pain Orientation Right;Left    Pain Descriptors / Indicators Aching    Pain Type Chronic pain                  Treatment:   Next to support bar: -static marching 10x each LE; very fatiguing throughout physical therapy session.  -lateral step 10x each LE; very fatiguing for patient -hip extension 10x each LE; BUE support cues for upright posture  -sit to stand with railing assistance 10x  Ambulate 96 ft with RW and close CGA; very fatiguing for patient, requiring occasional standing rest breaks but patient able to maintain foot clearance bilaterally  Seated:  LAQ 10x 5 second holds  Scapular retractions  intermittently performed throughout session for improved breathing technique,   Vitals monitored throughout physical therapy session.   Patient's vitals monitored throughout the session.  Pt educated throughout session about proper posture and technique with exercises. Improved exercise technique, movement at target joints, use of target muscles after min to mod verbal, visual, tactile cues   Patient is highly motivated throughout physical therapy session. He has not declined since he was last seen indicating his HEP compliance is excellent and has a good prognosis at this time. Intermittent scapular retractions throughout physical therapy session improves breathing and posture. Patient will benefit from skilled physical therapy to improve functional capacity of mobility, strength, and decrease fall risk to improve quality of life            PT Education - 08/03/20 1129     Education provided Yes    Education Details exercise technique, body mechanics    Person(s) Educated Patient    Methods Explanation;Demonstration;Tactile cues;Verbal cues    Comprehension Verbalized understanding;Returned demonstration;Verbal cues required;Tactile cues required              PT Short Term Goals - 07/15/20 1546       PT SHORT TERM GOAL #1   Title Pt will be compliant with given HEP to improve LE muscle strength.    Baseline 6/1: HEP added to    Time 6    Period Weeks    Status New    Target Date 08/26/20      PT SHORT TERM GOAL #2   Title Patient will stand from standard height chair on first attempt to allow for improved mobility.     Baseline 6/1: unable to perform    Time 6    Period Weeks    Status New    Target Date 08/26/20               PT Long Term Goals - 07/15/20 1319       PT LONG TERM GOAL #1   Title Pt will improve FOTO goal to 48 to display perceived improvements in functional mobility.    Baseline 6/1: 40%    Time 12    Period Weeks    Status New     Target Date 10/07/20      PT LONG TERM GOAL #2   Title Patient (> 26 years old) will complete five times sit to stand test in < 1 minute indicating an increased LE strength and improved balance.    Baseline 6/1: 1 min 31 seconds with heavy BUE support    Time 12    Period Weeks    Status New    Target Date 10/07/20  PT LONG TERM GOAL #3   Title Patient will increase BLE gross strength to 4+/5 as to improve functional strength for independent gait, increased standing tolerance and increased ADL ability.    Baseline 6/1: see note    Time 12    Period Weeks    Status New    Target Date 10/07/20      PT LONG TERM GOAL #4   Title Patient will increase 10 meter walk test to >1.0 m/s as to improve gait speed for better community ambulation and to reduce fall risk.    Baseline 6/1: 0.41 m/s with RW    Time 12    Period Weeks    Status New    Target Date 10/07/20      PT LONG TERM GOAL #5   Title Patient will stand without UE support >3 minutes for ADL performance and increased stability with independent movement.    Baseline 6/1: 1 min 7 seconds    Time 12    Period Weeks    Status New    Target Date 10/07/20                   Plan - 08/03/20 1206     Clinical Impression Statement ?Patient is highly motivated throughout physical therapy session. He has not declined since he was last seen indicating his HEP compliance is excellent and has a good prognosis at this time. Intermittent scapular retractions throughout physical therapy session improves breathing and posture. Patient will benefit from skilled physical therapy to improve functional capacity of mobility, strength, and decrease fall risk to improve quality of life    Personal Factors and Comorbidities Age;Comorbidity 3+;Past/Current Experience;Fitness    Comorbidities OPD, A-fib, Aortic Atherosclerosis, ischemic cardiomyopathy, Benign hypertension with chronic kidney disease, stage III (CMS-HCC); Essential  hypertension; Chronic bronchitis, unspecified chronic bronchitis type (CMS-HCC); OSA (obstructive sleep apnea); Morbid obesity with BMI of 40.0-44.9, adult (CMS-HCC)    Examination-Activity Limitations Bend;Carry;Squat;Continence;Stand;Transfers;Locomotion Level;Reach Overhead;Bed Mobility;Lift;Stairs;Toileting;Dressing    Examination-Participation Restrictions Community Activity;Laundry;Yard Work;Cleaning;Driving;Meal Prep;Shop    Stability/Clinical Decision Making Evolving/Moderate complexity    Rehab Potential Fair    PT Frequency 2x / week    PT Duration 12 weeks    PT Treatment/Interventions ADLs/Self Care Home Management;Moist Heat;Cryotherapy;Electrical Stimulation;Functional mobility training;Therapeutic activities;Therapeutic exercise;DME Instruction;Gait training;Balance training;Neuromuscular re-education;Patient/family education;Energy conservation;Biofeedback;Ultrasound;Stair training;Orthotic Fit/Training;Manual techniques;Manual lymph drainage;Dry needling;Passive range of motion;Taping;Vestibular;Visual/perceptual remediation/compensation    PT Next Visit Plan Basic standing and seated LE exercises.    Consulted and Agree with Plan of Care Patient             Patient will benefit from skilled therapeutic intervention in order to improve the following deficits and impairments:  Abnormal gait, Difficulty walking, Cardiopulmonary status limiting activity, Decreased endurance, Impaired UE functional use, Obesity, Decreased activity tolerance, Decreased balance, Improper body mechanics, Decreased mobility, Decreased strength, Impaired sensation, Postural dysfunction, Increased edema, Impaired flexibility, Decreased range of motion, Decreased coordination, Pain  Visit Diagnosis: Muscle weakness (generalized)  Unsteadiness on feet  Other abnormalities of gait and mobility     Problem List Patient Active Problem List   Diagnosis Date Noted   Atrial fibrillation ([place])  04/09/2019   Essential hypertension 04/09/2019   Venous stasis dermatitis of both lower extremities 04/09/2019   Lower limb ulcer, ankle, right, limited to breakdown of skin ([place]) 04/09/2019   Rhabdomyolysis 12/10/2018   Sepsis ([place]) 07/28/2018   Cough 03/20/2015   Bronchitis, chronic obstructive w acute bronchitis ([place]) 07/37/1062   Umbilical hernia without  obstruction and without gangrene 01/12/2015   Ventral hernia without obstruction or gangrene 01/02/2015    Janna Arch, PT, DPT   08/03/2020, 12:07 PM  [place] Eaton [facility] SERVICES [address] [place], [place], [address] Phone: [phone]   Fax:  [phone]  Name: Daniel Eaton. MRN: [id number] Date of Birth: 13-Apr-1940

## 2020-08-05 ENCOUNTER — Ambulatory Visit: Payer: HMO

## 2020-08-05 ENCOUNTER — Other Ambulatory Visit: Payer: Self-pay

## 2020-08-05 DIAGNOSIS — M6281 Muscle weakness (generalized): Secondary | ICD-10-CM

## 2020-08-05 DIAGNOSIS — R2689 Other abnormalities of gait and mobility: Secondary | ICD-10-CM

## 2020-08-05 DIAGNOSIS — R2681 Unsteadiness on feet: Secondary | ICD-10-CM

## 2020-08-05 NOTE — Therapy (Signed)
[place] MAIN [facility] SERVICES [address] [place], [place], [address] Phone: [phone]   Fax:  [phone]  Physical Therapy Treatment  Patient Details  Name: Daniel Eaton. MRN: [id number] Date of Birth: 1940/05/29 Referring Provider (PT): Dr. [name] MD   Encounter Date: 08/05/2020   PT End of Session - 08/05/20 1315     Visit Number 5    Number of Visits 24    Date for PT Re-Evaluation 10/07/20    Authorization Type 5/10 eval 6/1    PT Start Time 1304    PT Stop Time 1344    PT Time Calculation (min) 40 min    Equipment Utilized During Treatment Gait belt    Activity Tolerance Patient limited by fatigue;No increased pain    Behavior During Therapy WFL for tasks assessed/performed             Past Medical History:  Diagnosis Date   A-fib ([place])    Cellulitis    CHF (congestive heart failure) (HCC)    COPD (chronic obstructive pulmonary disease) ([place])    Difficult intubation    Hyperlipidemia    Hypertension    Hypothyroidism    Kidney disease    Sleep apnea    Stroke [facility])    Thyroid disease     Past Surgical History:  Procedure Laterality Date   CARDIAC CATHETERIZATION     CATARACT EXTRACTION W/ INTRAOCULAR LENS  IMPLANT, BILATERAL     COLONOSCOPY  2008   HERNIA REPAIR     bilateral inguinal hernia/ [place]   HERNIA REPAIR  02/02/2015   18 x 28 cm ventral light mesh placed laparoscopically.   TONSILLECTOMY     UMBILICAL HERNIA REPAIR N/A 02/02/2015   Procedure: HERNIA REPAIR UMBILICAL ADULT;  Surgeon: Robert Bellow, MD;  Location: [facility] ORS;  Service: General;  Laterality: N/A;   VENTRAL HERNIA REPAIR N/A 02/02/2015   Procedure: LAPAROSCOPIC VENTRAL HERNIA;  Surgeon: Robert Bellow, MD;  Location: [facility] ORS;  Service: General;  Laterality: N/A;   vp shunt placement  1979    There were no vitals filed for this visit.   Subjective Assessment - 08/05/20 1310     Subjective Patient  reports he finished his antibiotics yesterday. Has been compliant with HEP. No falls or LOB since last session.    Pertinent History Pt is a 80 y.o. male referred to PT for Physical deconditioning. Pt is known to this clinic and has received rehab here in 2019. Pt has a significant PMH of COPD, A-fib, Aortic Atherosclerosis, ischemic cardiomyopathy, benign hypertension with chronic kidney disease, stage III (CMS-HCC); Essential hypertension; Chronic bronchitis, unspecified chronic bronchitis type (CMS-HCC); OSA (obstructive sleep apnea); Morbid obesity with BMI of 40.0-44.9, adult (CMS-HCC) per last cardiology visit. Pt reports being unable to being able to participate in PT during his stay at [facility] this previous October and also tested positive for COVID-19.  Pt's goal is to focus on gait, balance, LE strength, and walking endurance with RW. Was last seen by this therapist on 05/27/20.    Limitations Standing;House hold activities;Walking;Lifting    How long can you sit comfortably? hour    How long can you stand comfortably? not sure    How long can you walk comfortably? walk with RW to bathroom and back    Patient Stated Goals Improve gait, balance, and LE strength. Reduce risk of falls.    Currently in Pain? No/denies  Treatment: Standing with RW: -Static stand no UE support 60 seconds ;  -6" step toe taps with UE support 10x each LE -stand with horizontal head turns 10x    Ambulate 96 ft with RW and close CGA; one standing rest break for <5 seconds. Increased speed compared to previous session, good foot clearance bilaterally; x 2 sets. Second set HR elevation to 124.    Seated: Sit to stand 5x; cues for sequencing and UE support ; x2 sets  Alternating LAQ kicking soccer ball for coordination sequencing and muscle activation timing.  Alternating hedgehog taps 10x each LE; more challenging for LLE.  Scapular retractions intermittently performed throughout  session for improved breathing technique, Gluteal squeeze 10x 5 second hold   Vitals monitored throughout physical therapy session.    Patient's vitals monitored throughout the session.  Pt educated throughout session about proper posture and technique with exercises. Improved exercise technique, movement at target joints, use of target muscles after min to mod verbal, visual, tactile cues     Patient is able to ambulate with improved gait speed this session. He does require seated rest breaks between each intervention. Patient is highly motivated throughout physical therapy session.His shortness of breath recovery period is decreasing indicating improved mobility and capacity for mobility. Patient will benefit from skilled physical therapy to improve functional capacity of mobility, strength, and decrease fall risk to improve quality of life                       PT Education - 08/05/20 1310     Education provided Yes    Education Details exercise technique, body mechanics    Person(s) Educated Patient    Methods Explanation;Demonstration;Tactile cues;Verbal cues    Comprehension Verbalized understanding;Returned demonstration;Verbal cues required;Tactile cues required              PT Short Term Goals - 07/15/20 1546       PT SHORT TERM GOAL #1   Title Pt will be compliant with given HEP to improve LE muscle strength.    Baseline 6/1: HEP added to    Time 6    Period Weeks    Status New    Target Date 08/26/20      PT SHORT TERM GOAL #2   Title Patient will stand from standard height chair on first attempt to allow for improved mobility.     Baseline 6/1: unable to perform    Time 6    Period Weeks    Status New    Target Date 08/26/20               PT Long Term Goals - 07/15/20 1319       PT LONG TERM GOAL #1   Title Pt will improve FOTO goal to 48 to display perceived improvements in functional mobility.    Baseline 6/1: 40%    Time 12     Period Weeks    Status New    Target Date 10/07/20      PT LONG TERM GOAL #2   Title Patient (> 45 years old) will complete five times sit to stand test in < 1 minute indicating an increased LE strength and improved balance.    Baseline 6/1: 1 min 31 seconds with heavy BUE support    Time 12    Period Weeks    Status New    Target Date 10/07/20      PT LONG TERM GOAL #  3   Title Patient will increase BLE gross strength to 4+/5 as to improve functional strength for independent gait, increased standing tolerance and increased ADL ability.    Baseline 6/1: see note    Time 12    Period Weeks    Status New    Target Date 10/07/20      PT LONG TERM GOAL #4   Title Patient will increase 10 meter walk test to >1.0 m/s as to improve gait speed for better community ambulation and to reduce fall risk.    Baseline 6/1: 0.41 m/s with RW    Time 12    Period Weeks    Status New    Target Date 10/07/20      PT LONG TERM GOAL #5   Title Patient will stand without UE support >3 minutes for ADL performance and increased stability with independent movement.    Baseline 6/1: 1 min 7 seconds    Time 12    Period Weeks    Status New    Target Date 10/07/20                   Plan - 08/05/20 1334     Clinical Impression Statement Patient is able to ambulate with improved gait speed this session. He does require seated rest breaks between each intervention. Patient is highly motivated throughout physical therapy session.His shortness of breath recovery period is decreasing indicating improved mobility and capacity for mobility. Patient will benefit from skilled physical therapy to improve functional capacity of mobility, strength, and decrease fall risk to improve quality of life    Personal Factors and Comorbidities Age;Comorbidity 3+;Past/Current Experience;Fitness    Comorbidities OPD, A-fib, Aortic Atherosclerosis, ischemic cardiomyopathy, Benign hypertension with chronic kidney  disease, stage III (CMS-HCC); Essential hypertension; Chronic bronchitis, unspecified chronic bronchitis type (CMS-HCC); OSA (obstructive sleep apnea); Morbid obesity with BMI of 40.0-44.9, adult (CMS-HCC)    Examination-Activity Limitations Bend;Carry;Squat;Continence;Stand;Transfers;Locomotion Level;Reach Overhead;Bed Mobility;Lift;Stairs;Toileting;Dressing    Examination-Participation Restrictions Community Activity;Laundry;Yard Work;Cleaning;Driving;Meal Prep;Shop    Stability/Clinical Decision Making Evolving/Moderate complexity    Rehab Potential Fair    PT Frequency 2x / week    PT Duration 12 weeks    PT Treatment/Interventions ADLs/Self Care Home Management;Moist Heat;Cryotherapy;Electrical Stimulation;Functional mobility training;Therapeutic activities;Therapeutic exercise;DME Instruction;Gait training;Balance training;Neuromuscular re-education;Patient/family education;Energy conservation;Biofeedback;Ultrasound;Stair training;Orthotic Fit/Training;Manual techniques;Manual lymph drainage;Dry needling;Passive range of motion;Taping;Vestibular;Visual/perceptual remediation/compensation    PT Next Visit Plan Basic standing and seated LE exercises.    Consulted and Agree with Plan of Care Patient             Patient will benefit from skilled therapeutic intervention in order to improve the following deficits and impairments:  Abnormal gait, Difficulty walking, Cardiopulmonary status limiting activity, Decreased endurance, Impaired UE functional use, Obesity, Decreased activity tolerance, Decreased balance, Improper body mechanics, Decreased mobility, Decreased strength, Impaired sensation, Postural dysfunction, Increased edema, Impaired flexibility, Decreased range of motion, Decreased coordination, Pain  Visit Diagnosis: Muscle weakness (generalized)  Unsteadiness on feet  Other abnormalities of gait and mobility     Problem List Patient Active Problem List   Diagnosis Date  Noted   Atrial fibrillation ([place]) 04/09/2019   Essential hypertension 04/09/2019   Venous stasis dermatitis of both lower extremities 04/09/2019   Lower limb ulcer, ankle, right, limited to breakdown of skin ([place]) 04/09/2019   Rhabdomyolysis 12/10/2018   Sepsis ([place]) 07/28/2018   Cough 03/20/2015   Bronchitis, chronic obstructive w acute bronchitis ([place]) 54/27/0623   Umbilical hernia without obstruction and without gangrene  01/12/2015   Ventral hernia without obstruction or gangrene 01/02/2015   Janna Arch, PT, DPT  08/05/2020, 1:45 PM  [place] MAIN [facility] SERVICES [address] [place], [place], [address] Phone: [phone]   Fax:  [phone]  Name: Hardeep Reetz. MRN: [id number] Date of Birth: Oct 23, 1940

## 2020-08-10 ENCOUNTER — Ambulatory Visit: Payer: HMO

## 2020-08-10 ENCOUNTER — Other Ambulatory Visit: Payer: Self-pay

## 2020-08-10 DIAGNOSIS — R2681 Unsteadiness on feet: Secondary | ICD-10-CM

## 2020-08-10 DIAGNOSIS — M6281 Muscle weakness (generalized): Secondary | ICD-10-CM

## 2020-08-10 DIAGNOSIS — R2689 Other abnormalities of gait and mobility: Secondary | ICD-10-CM

## 2020-08-10 NOTE — Therapy (Signed)
[place] MAIN [facility] SERVICES [address] [place], [place], [address] Phone: [phone]   Fax:  [phone]  Physical Therapy Treatment  Patient Details  Name: Daniel Eaton. MRN: [id number] Date of Birth: Nov 21, 1940 Referring Provider (PT): Dr. [name] MD   Encounter Date: 08/10/2020   PT End of Session - 08/10/20 1137     Visit Number 6    Number of Visits 24    Date for PT Re-Evaluation 10/07/20    Authorization Type 6/10 eval 6/1    PT Start Time 1120    PT Stop Time 1200    PT Time Calculation (min) 40 min    Equipment Utilized During Treatment Gait belt    Activity Tolerance Patient limited by fatigue;No increased pain    Behavior During Therapy WFL for tasks assessed/performed             Past Medical History:  Diagnosis Date   A-fib ([place])    Cellulitis    CHF (congestive heart failure) (HCC)    COPD (chronic obstructive pulmonary disease) ([place])    Difficult intubation    Hyperlipidemia    Hypertension    Hypothyroidism    Kidney disease    Sleep apnea    Stroke [facility])    Thyroid disease     Past Surgical History:  Procedure Laterality Date   CARDIAC CATHETERIZATION     CATARACT EXTRACTION W/ INTRAOCULAR LENS  IMPLANT, BILATERAL     COLONOSCOPY  2008   HERNIA REPAIR     bilateral inguinal hernia/ [facility]   HERNIA REPAIR  02/02/2015   18 x 28 cm ventral light mesh placed laparoscopically.   TONSILLECTOMY     UMBILICAL HERNIA REPAIR N/A 02/02/2015   Procedure: HERNIA REPAIR UMBILICAL ADULT;  Surgeon: Robert Bellow, MD;  Location: [facility] ORS;  Service: General;  Laterality: N/A;   VENTRAL HERNIA REPAIR N/A 02/02/2015   Procedure: LAPAROSCOPIC VENTRAL HERNIA;  Surgeon: Robert Bellow, MD;  Location: [facility] ORS;  Service: General;  Laterality: N/A;   vp shunt placement  1979    There were no vitals filed for this visit.   Subjective Assessment - 08/10/20 1136     Subjective Patient  reports he was very sore but had the self conviction to be compliant with HEP. No falls or LOB since last session.    Pertinent History Pt is a 80 y.o. male referred to PT for Physical deconditioning. Pt is known to this clinic and has received rehab here in 2019. Pt has a significant PMH of COPD, A-fib, Aortic Atherosclerosis, ischemic cardiomyopathy, benign hypertension with chronic kidney disease, stage III (CMS-HCC); Essential hypertension; Chronic bronchitis, unspecified chronic bronchitis type (CMS-HCC); OSA (obstructive sleep apnea); Morbid obesity with BMI of 40.0-44.9, adult (CMS-HCC) per last cardiology visit. Pt reports being unable to being able to participate in PT during his stay at [facility] this previous October and also tested positive for COVID-19.  Pt's goal is to focus on gait, balance, LE strength, and walking endurance with RW. Was last seen by this therapist on 05/27/20.    Limitations Standing;House hold activities;Walking;Lifting    How long can you sit comfortably? hour    How long can you stand comfortably? not sure    How long can you walk comfortably? walk with RW to bathroom and back    Patient Stated Goals Improve gait, balance, and LE strength. Reduce risk of falls.    Currently in Pain?  No/denies                Treatment:  Standing:  2lb ankle weight:  -marching 2x length of // bars with BUE support, max cueing for slowing down for increased arc of motion; patient reports very fatiguing -lateral stepping 2x length of // bars; with BUE support;  -hip extension 10x each LE, BUE support Forward ambulation with BUE support cues for sequencing and safety.  hedgehog taps 6x each LE ; very challenging for patient with BUE support     Seated: 2lb ankle weight:  -LAQ 10x each LE; cues for sequencing.  -10x heel raise  at same time  Scapular retractions intermittently performed throughout session for improved breathing technique, Gluteal squeeze 10x 5  second hold    Vitals monitored throughout physical therapy session.    Patient's vitals monitored throughout the session.  Pt educated throughout session about proper posture and technique with exercises. Improved exercise technique, movement at target joints, use of target muscles after min to mod verbal, visual, tactile cues    Patient is highly motivated throughout physical therapy session despite fatigue. He requires intermittent seated rest breaks however his respiration is recovering quicker than prior sessions. He demonstrates functional foot clearance even with resistance/weight. Patient will benefit from skilled physical therapy to improve functional capacity of mobility, strength, and decrease fall risk to improve quality of life              PT Education - 08/10/20 1136     Education provided Yes    Education Details exercise technique, body mechanics    Person(s) Educated Patient    Methods Explanation;Demonstration;Tactile cues;Verbal cues    Comprehension Verbalized understanding;Returned demonstration;Verbal cues required;Tactile cues required              PT Short Term Goals - 07/15/20 1546       PT SHORT TERM GOAL #1   Title Pt will be compliant with given HEP to improve LE muscle strength.    Baseline 6/1: HEP added to    Time 6    Period Weeks    Status New    Target Date 08/26/20      PT SHORT TERM GOAL #2   Title Patient will stand from standard height chair on first attempt to allow for improved mobility.     Baseline 6/1: unable to perform    Time 6    Period Weeks    Status New    Target Date 08/26/20               PT Long Term Goals - 07/15/20 1319       PT LONG TERM GOAL #1   Title Pt will improve FOTO goal to 48 to display perceived improvements in functional mobility.    Baseline 6/1: 40%    Time 12    Period Weeks    Status New    Target Date 10/07/20      PT LONG TERM GOAL #2   Title Patient (> 15 years old) will  complete five times sit to stand test in < 1 minute indicating an increased LE strength and improved balance.    Baseline 6/1: 1 min 31 seconds with heavy BUE support    Time 12    Period Weeks    Status New    Target Date 10/07/20      PT LONG TERM GOAL #3   Title Patient will increase BLE gross strength to 4+/5  as to improve functional strength for independent gait, increased standing tolerance and increased ADL ability.    Baseline 6/1: see note    Time 12    Period Weeks    Status New    Target Date 10/07/20      PT LONG TERM GOAL #4   Title Patient will increase 10 meter walk test to >1.0 m/s as to improve gait speed for better community ambulation and to reduce fall risk.    Baseline 6/1: 0.41 m/s with RW    Time 12    Period Weeks    Status New    Target Date 10/07/20      PT LONG TERM GOAL #5   Title Patient will stand without UE support >3 minutes for ADL performance and increased stability with independent movement.    Baseline 6/1: 1 min 7 seconds    Time 12    Period Weeks    Status New    Target Date 10/07/20                   Plan - 08/10/20 1152     Clinical Impression Statement Patient is highly motivated throughout physical therapy session despite fatigue. He requires intermittent seated rest breaks however his respiration is recovering quicker than prior sessions. He demonstrates functional foot clearance even with resistance/weight. Patient will benefit from skilled physical therapy to improve functional capacity of mobility, strength, and decrease fall risk to improve quality of life  ?    Personal Factors and Comorbidities Age;Comorbidity 3+;Past/Current Experience;Fitness    Comorbidities OPD, A-fib, Aortic Atherosclerosis, ischemic cardiomyopathy, Benign hypertension with chronic kidney disease, stage III (CMS-HCC); Essential hypertension; Chronic bronchitis, unspecified chronic bronchitis type (CMS-HCC); OSA (obstructive sleep apnea); Morbid  obesity with BMI of 40.0-44.9, adult (CMS-HCC)    Examination-Activity Limitations Bend;Carry;Squat;Continence;Stand;Transfers;Locomotion Level;Reach Overhead;Bed Mobility;Lift;Stairs;Toileting;Dressing    Examination-Participation Restrictions Community Activity;Laundry;Yard Work;Cleaning;Driving;Meal Prep;Shop    Stability/Clinical Decision Making Evolving/Moderate complexity    Rehab Potential Fair    PT Frequency 2x / week    PT Duration 12 weeks    PT Treatment/Interventions ADLs/Self Care Home Management;Moist Heat;Cryotherapy;Electrical Stimulation;Functional mobility training;Therapeutic activities;Therapeutic exercise;DME Instruction;Gait training;Balance training;Neuromuscular re-education;Patient/family education;Energy conservation;Biofeedback;Ultrasound;Stair training;Orthotic Fit/Training;Manual techniques;Manual lymph drainage;Dry needling;Passive range of motion;Taping;Vestibular;Visual/perceptual remediation/compensation    PT Next Visit Plan Basic standing and seated LE exercises.    Consulted and Agree with Plan of Care Patient             Patient will benefit from skilled therapeutic intervention in order to improve the following deficits and impairments:  Abnormal gait, Difficulty walking, Cardiopulmonary status limiting activity, Decreased endurance, Impaired UE functional use, Obesity, Decreased activity tolerance, Decreased balance, Improper body mechanics, Decreased mobility, Decreased strength, Impaired sensation, Postural dysfunction, Increased edema, Impaired flexibility, Decreased range of motion, Decreased coordination, Pain  Visit Diagnosis: Muscle weakness (generalized)  Unsteadiness on feet  Other abnormalities of gait and mobility     Problem List Patient Active Problem List   Diagnosis Date Noted   Atrial fibrillation ([place]) 04/09/2019   Essential hypertension 04/09/2019   Venous stasis dermatitis of both lower extremities 04/09/2019   Lower limb  ulcer, ankle, right, limited to breakdown of skin ([place]) 04/09/2019   Rhabdomyolysis 12/10/2018   Sepsis ([place]) 07/28/2018   Cough 03/20/2015   Bronchitis, chronic obstructive w acute bronchitis ([place]) 40/11/2723   Umbilical hernia without obstruction and without gangrene 01/12/2015   Ventral hernia without obstruction or gangrene 01/02/2015    Janna Arch, PT, DPT  08/10/2020, 12:46 PM  [place] MAIN [facility] SERVICES [address] [place], [place], [address] Phone: [phone]   Fax:  [phone]  Name: Daniel Eaton. MRN: [id number] Date of Birth: Jul 10, 1940

## 2020-08-12 ENCOUNTER — Ambulatory Visit: Payer: HMO

## 2020-08-12 ENCOUNTER — Other Ambulatory Visit: Payer: Self-pay

## 2020-08-12 DIAGNOSIS — R2681 Unsteadiness on feet: Secondary | ICD-10-CM

## 2020-08-12 DIAGNOSIS — M6281 Muscle weakness (generalized): Secondary | ICD-10-CM

## 2020-08-12 DIAGNOSIS — R2689 Other abnormalities of gait and mobility: Secondary | ICD-10-CM

## 2020-08-12 NOTE — Therapy (Signed)
[place] MAIN [facility] SERVICES [address] [place], [place], [address] Phone: [phone]   Fax:  [phone]  Physical Therapy Treatment  Patient Details  Name: Daniel Eaton. MRN: [id number] Date of Birth: [birth year] Referring Provider (PT): Dr. [name] MD   Encounter Date: 08/12/2020   PT End of Session - 08/12/20 1350     Visit Number 7    Number of Visits 24    Date for PT Re-Evaluation 10/07/20    Authorization Type 7/10 eval 6/1    PT Start Time 1309    PT Stop Time 1348    PT Time Calculation (min) 39 min    Equipment Utilized During Treatment Gait belt    Activity Tolerance Patient limited by fatigue;No increased pain    Behavior During Therapy WFL for tasks assessed/performed             Past Medical History:  Diagnosis Date   A-fib ([place])    Cellulitis    CHF (congestive heart failure) (HCC)    COPD (chronic obstructive pulmonary disease) ([place])    Difficult intubation    Hyperlipidemia    Hypertension    Hypothyroidism    Kidney disease    Sleep apnea    Stroke [facility])    Thyroid disease     Past Surgical History:  Procedure Laterality Date   CARDIAC CATHETERIZATION     CATARACT EXTRACTION W/ INTRAOCULAR LENS  IMPLANT, BILATERAL     COLONOSCOPY  2008   HERNIA REPAIR     bilateral inguinal hernia/ [facility]   HERNIA REPAIR  02/02/2015   18 x 28 cm ventral light mesh placed laparoscopically.   TONSILLECTOMY     UMBILICAL HERNIA REPAIR N/A 02/02/2015   Procedure: HERNIA REPAIR UMBILICAL ADULT;  Surgeon: Robert Bellow, MD;  Location: [facility] ORS;  Service: General;  Laterality: N/A;   VENTRAL HERNIA REPAIR N/A 02/02/2015   Procedure: LAPAROSCOPIC VENTRAL HERNIA;  Surgeon: Robert Bellow, MD;  Location: [facility] ORS;  Service: General;  Laterality: N/A;   vp shunt placement  1979    There were no vitals filed for this visit.   Subjective Assessment - 08/12/20 1336     Subjective Patient  reports he overdid it yesterday.Is very worn out today.    Pertinent History Pt is a 80 y.o. male referred to PT for Physical deconditioning. Pt is known to this clinic and has received rehab here in 2019. Pt has a significant PMH of COPD, A-fib, Aortic Atherosclerosis, ischemic cardiomyopathy, benign hypertension with chronic kidney disease, stage III (CMS-HCC); Essential hypertension; Chronic bronchitis, unspecified chronic bronchitis type (CMS-HCC); OSA (obstructive sleep apnea); Morbid obesity with BMI of 40.0-44.9, adult (CMS-HCC) per last cardiology visit. Pt reports being unable to being able to participate in PT during his stay at [facility] this previous October and also tested positive for COVID-19.  Pt's goal is to focus on gait, balance, LE strength, and walking endurance with RW. Was last seen by this therapist on 05/27/20.    Limitations Standing;House hold activities;Walking;Lifting    How long can you sit comfortably? hour    How long can you stand comfortably? not sure    How long can you walk comfortably? walk with RW to bathroom and back    Patient Stated Goals Improve gait, balance, and LE strength. Reduce risk of falls.    Currently in Pain? No/denies  Treatment: Standing with RW: -Static stand no UE support raising alternating Ue's for pertubation and coordination; 10x each UE; 2 sets  -6" step toe taps with UE support 10x each LE -hip extension 10x each LE    Ambulate 96 ft with RW and close CGA; one standing rest break for <5 seconds. Increased speed compared to previous session, good foot clearance bilaterally;    Seated: Sit to stand terminated due to anterior aspect of quad pain.  Scapular retractions intermittently performed throughout session for improved breathing technique, Gluteal squeeze 10x 3-5 second hold  Adductor squeeze 10x 5 second holds   seated heel toe rocking 15x  Vitals monitored throughout physical therapy session.    Education on energy conservation techniques.    Patient's vitals monitored throughout the session.  Pt educated throughout session about proper posture and technique with exercises. Improved exercise technique, movement at target joints, use of target muscles after min to mod verbal, visual, tactile cues   Patient is educated on importance of energy conservation to prevent injury and/or loss of balance; verbalized understanding. Is highly motivated despite severe fatigue and muscle soreness; his sit to stands had to be terminated this session due to risk of injury. Patient will benefit from skilled physical therapy to improve functional capacity of mobility, strength, and decrease fall risk to improve quality of life                 PT Education - 08/12/20 1339     Education provided Yes    Education Details exercise technique, body mechanics    Person(s) Educated Patient    Methods Explanation;Demonstration;Tactile cues;Verbal cues    Comprehension Verbalized understanding;Returned demonstration;Verbal cues required;Tactile cues required              PT Short Term Goals - 07/15/20 1546       PT SHORT TERM GOAL #1   Title Pt will be compliant with given HEP to improve LE muscle strength.    Baseline 6/1: HEP added to    Time 6    Period Weeks    Status New    Target Date 08/26/20      PT SHORT TERM GOAL #2   Title Patient will stand from standard height chair on first attempt to allow for improved mobility.     Baseline 6/1: unable to perform    Time 6    Period Weeks    Status New    Target Date 08/26/20               PT Long Term Goals - 07/15/20 1319       PT LONG TERM GOAL #1   Title Pt will improve FOTO goal to 48 to display perceived improvements in functional mobility.    Baseline 6/1: 40%    Time 12    Period Weeks    Status New    Target Date 10/07/20      PT LONG TERM GOAL #2   Title Patient (> 32 years old) will complete five  times sit to stand test in < 1 minute indicating an increased LE strength and improved balance.    Baseline 6/1: 1 min 31 seconds with heavy BUE support    Time 12    Period Weeks    Status New    Target Date 10/07/20      PT LONG TERM GOAL #3   Title Patient will increase BLE gross strength to 4+/5 as to improve functional  strength for independent gait, increased standing tolerance and increased ADL ability.    Baseline 6/1: see note    Time 12    Period Weeks    Status New    Target Date 10/07/20      PT LONG TERM GOAL #4   Title Patient will increase 10 meter walk test to >1.0 m/s as to improve gait speed for better community ambulation and to reduce fall risk.    Baseline 6/1: 0.41 m/s with RW    Time 12    Period Weeks    Status New    Target Date 10/07/20      PT LONG TERM GOAL #5   Title Patient will stand without UE support >3 minutes for ADL performance and increased stability with independent movement.    Baseline 6/1: 1 min 7 seconds    Time 12    Period Weeks    Status New    Target Date 10/07/20                   Plan - 08/12/20 1351     Clinical Impression Statement Patient is educated on importance of energy conservation to prevent injury and/or loss of balance; verbalized understanding. Is highly motivated despite severe fatigue and muscle soreness; his sit to stands had to be terminated this session due to risk of injury. Patient will benefit from skilled physical therapy to improve functional capacity of mobility, strength, and decrease fall risk to improve quality of life    Personal Factors and Comorbidities Age;Comorbidity 3+;Past/Current Experience;Fitness    Comorbidities OPD, A-fib, Aortic Atherosclerosis, ischemic cardiomyopathy, Benign hypertension with chronic kidney disease, stage III (CMS-HCC); Essential hypertension; Chronic bronchitis, unspecified chronic bronchitis type (CMS-HCC); OSA (obstructive sleep apnea); Morbid obesity with BMI of  40.0-44.9, adult (CMS-HCC)    Examination-Activity Limitations Bend;Carry;Squat;Continence;Stand;Transfers;Locomotion Level;Reach Overhead;Bed Mobility;Lift;Stairs;Toileting;Dressing    Examination-Participation Restrictions Community Activity;Laundry;Yard Work;Cleaning;Driving;Meal Prep;Shop    Stability/Clinical Decision Making Evolving/Moderate complexity    Rehab Potential Fair    PT Frequency 2x / week    PT Duration 12 weeks    PT Treatment/Interventions ADLs/Self Care Home Management;Moist Heat;Cryotherapy;Electrical Stimulation;Functional mobility training;Therapeutic activities;Therapeutic exercise;DME Instruction;Gait training;Balance training;Neuromuscular re-education;Patient/family education;Energy conservation;Biofeedback;Ultrasound;Stair training;Orthotic Fit/Training;Manual techniques;Manual lymph drainage;Dry needling;Passive range of motion;Taping;Vestibular;Visual/perceptual remediation/compensation    PT Next Visit Plan Basic standing and seated LE exercises.    Consulted and Agree with Plan of Care Patient             Patient will benefit from skilled therapeutic intervention in order to improve the following deficits and impairments:  Abnormal gait, Difficulty walking, Cardiopulmonary status limiting activity, Decreased endurance, Impaired UE functional use, Obesity, Decreased activity tolerance, Decreased balance, Improper body mechanics, Decreased mobility, Decreased strength, Impaired sensation, Postural dysfunction, Increased edema, Impaired flexibility, Decreased range of motion, Decreased coordination, Pain  Visit Diagnosis: Muscle weakness (generalized)  Unsteadiness on feet  Other abnormalities of gait and mobility     Problem List Patient Active Problem List   Diagnosis Date Noted   Atrial fibrillation ([place]) 04/09/2019   Essential hypertension 04/09/2019   Venous stasis dermatitis of both lower extremities 04/09/2019   Lower limb ulcer, ankle, right,  limited to breakdown of skin ([place]) 04/09/2019   Rhabdomyolysis 12/10/2018   Sepsis ([place]) 07/28/2018   Cough 03/20/2015   Bronchitis, chronic obstructive w acute bronchitis ([place]) 28/36/6294   Umbilical hernia without obstruction and without gangrene 01/12/2015   Ventral hernia without obstruction or gangrene 01/02/2015   Janna Arch, PT, DPT  08/12/2020, 1:52 PM  [place] MAIN [facility] SERVICES [address] [place], [place], [address] Phone: [phone]   Fax:  [phone]  Name: Daniel Eaton. MRN: [id number] Date of Birth: [birth year]

## 2020-08-19 ENCOUNTER — Other Ambulatory Visit: Payer: Self-pay

## 2020-08-19 ENCOUNTER — Ambulatory Visit: Payer: HMO | Attending: Internal Medicine

## 2020-08-19 DIAGNOSIS — R2681 Unsteadiness on feet: Secondary | ICD-10-CM | POA: Diagnosis not present

## 2020-08-19 DIAGNOSIS — M6281 Muscle weakness (generalized): Secondary | ICD-10-CM

## 2020-08-19 DIAGNOSIS — R2689 Other abnormalities of gait and mobility: Secondary | ICD-10-CM | POA: Diagnosis not present

## 2020-08-19 NOTE — Therapy (Signed)
[place] MAIN [facility] 2 SERVICES [address] [place], [place], [address] Phone: [phone]   Fax:  [phone]  Physical Therapy Treatment  Patient Details  Name: Daniel Eaton. MRN: [id number] Date of Birth: [birth year] Referring Provider (PT): Dr. [name] MD   Encounter Date: 08/19/2020   PT End of Session - 08/19/20 1356     Visit Number 8    Number of Visits 24    Date for PT Re-Evaluation 10/07/20    Authorization Type 8/10 eval 6/1    PT Start Time 1346    PT Stop Time 1429    PT Time Calculation (min) 43 min    Equipment Utilized During Treatment Gait belt    Activity Tolerance Patient limited by fatigue;No increased pain    Behavior During Therapy WFL for tasks assessed/performed             Past Medical History:  Diagnosis Date   A-fib ([place])    Cellulitis    CHF (congestive heart failure) (HCC)    COPD (chronic obstructive pulmonary disease) ([place])    Difficult intubation    Hyperlipidemia    Hypertension    Hypothyroidism    Kidney disease    Sleep apnea    Stroke [facility])    Thyroid disease     Past Surgical History:  Procedure Laterality Date   CARDIAC CATHETERIZATION     CATARACT EXTRACTION W/ INTRAOCULAR LENS  IMPLANT, BILATERAL     COLONOSCOPY  2008   HERNIA REPAIR     bilateral inguinal hernia/ [place]   HERNIA REPAIR  02/02/2015   18 x 28 cm ventral light mesh placed laparoscopically.   TONSILLECTOMY     UMBILICAL HERNIA REPAIR N/A 02/02/2015   Procedure: HERNIA REPAIR UMBILICAL ADULT;  Surgeon: Robert Bellow, MD;  Location: [facility] ORS;  Service: General;  Laterality: N/A;   VENTRAL HERNIA REPAIR N/A 02/02/2015   Procedure: LAPAROSCOPIC VENTRAL HERNIA;  Surgeon: Robert Bellow, MD;  Location: [facility] ORS;  Service: General;  Laterality: N/A;   vp shunt placement  1979    There were no vitals filed for this visit.   Subjective Assessment - 08/19/20 1351     Subjective patient  reports compliance with HEP. No falls or LOB since last session.    Pertinent History Pt is a 80 y.o. male referred to PT for Physical deconditioning. Pt is known to this clinic and has received rehab here in 2019. Pt has a significant PMH of COPD, A-fib, Aortic Atherosclerosis, ischemic cardiomyopathy, benign hypertension with chronic kidney disease, stage III (CMS-HCC); Essential hypertension; Chronic bronchitis, unspecified chronic bronchitis type (CMS-HCC); OSA (obstructive sleep apnea); Morbid obesity with BMI of 40.0-44.9, adult (CMS-HCC) per last cardiology visit. Pt reports being unable to being able to participate in PT during his stay at [facility] this previous October and also tested positive for COVID-19.  Pt's goal is to focus on gait, balance, LE strength, and walking endurance with RW. Was last seen by this therapist on 05/27/20.    Limitations Standing;House hold activities;Walking;Lifting    How long can you sit comfortably? hour    How long can you stand comfortably? not sure    How long can you walk comfortably? walk with RW to bathroom and back    Patient Stated Goals Improve gait, balance, and LE strength. Reduce risk of falls.    Currently in Pain? Yes    Pain Score 6  Pain Location Leg    Pain Orientation Right;Left    Pain Descriptors / Indicators Aching;Sore    Pain Type Chronic pain                 Standing in // bars:    Ambulating without UE support in // bar able to perform 1x without LOB    Side-stepping within // bars, cues for foot clearance x4 lengths of // bars; extreme shortness of breathe requiring standing break between reps   Step over half foam roller 10x each LE; BUE support ; very very challenging for patient  Backwards ambulation 4x length with BUE support close CGA    Mini squat with BUE support 10x able to perform without pain increase   seated: Scapular retraction multiple sets throughout session Seated LAQ 10x each LE; March  10x each LE; one LE at a time cue for arc of motion  Alternating ER/IR 10x each LE  Session cut a minute or two short due to patient needing to toilet.   Vitals monitored throughout session to ensure therapeutic range.     Pt educated throughout session about proper posture and technique with exercises. Improved exercise technique, movement at target joints, use of target muscles after min to mod verbal, visual, tactile cues    Patient is able to tolerate increased repetitions prior to rest breaks this session. As patient fatigues however he does demonstrate increased trunk flexion. He is highly motivated throughout session and is eager for progression due to desire for higher level of mobility and independence. Patient will benefit from skilled physical therapy to improve functional capacity of mobility, strength, and decrease fall risk to improve quality of life                   PT Education - 08/19/20 1356     Education provided Yes    Education Details exercise technique, body mechanics    Person(s) Educated Patient    Methods Explanation;Demonstration;Tactile cues;Verbal cues    Comprehension Verbalized understanding;Returned demonstration;Verbal cues required;Tactile cues required              PT Short Term Goals - 07/15/20 1546       PT SHORT TERM GOAL #1   Title Pt will be compliant with given HEP to improve LE muscle strength.    Baseline 6/1: HEP added to    Time 6    Period Weeks    Status New    Target Date 08/26/20      PT SHORT TERM GOAL #2   Title Patient will stand from standard height chair on first attempt to allow for improved mobility.     Baseline 6/1: unable to perform    Time 6    Period Weeks    Status New    Target Date 08/26/20               PT Long Term Goals - 07/15/20 1319       PT LONG TERM GOAL #1   Title Pt will improve FOTO goal to 48 to display perceived improvements in functional mobility.    Baseline 6/1:  40%    Time 12    Period Weeks    Status New    Target Date 10/07/20      PT LONG TERM GOAL #2   Title Patient (> 3 years old) will complete five times sit to stand test in < 1 minute indicating an increased LE strength and  improved balance.    Baseline 6/1: 1 min 31 seconds with heavy BUE support    Time 12    Period Weeks    Status New    Target Date 10/07/20      PT LONG TERM GOAL #3   Title Patient will increase BLE gross strength to 4+/5 as to improve functional strength for independent gait, increased standing tolerance and increased ADL ability.    Baseline 6/1: see note    Time 12    Period Weeks    Status New    Target Date 10/07/20      PT LONG TERM GOAL #4   Title Patient will increase 10 meter walk test to >1.0 m/s as to improve gait speed for better community ambulation and to reduce fall risk.    Baseline 6/1: 0.41 m/s with RW    Time 12    Period Weeks    Status New    Target Date 10/07/20      PT LONG TERM GOAL #5   Title Patient will stand without UE support >3 minutes for ADL performance and increased stability with independent movement.    Baseline 6/1: 1 min 7 seconds    Time 12    Period Weeks    Status New    Target Date 10/07/20                   Plan - 08/19/20 1418     Clinical Impression Statement Patient is able to tolerate increased repetitions prior to rest breaks this session. As patient fatigues however he does demonstrate increased trunk flexion. He is highly motivated throughout session and is eager for progression due to desire for higher level of mobility and independence. Patient will benefit from skilled physical therapy to improve functional capacity of mobility, strength, and decrease fall risk to improve quality of life    Personal Factors and Comorbidities Age;Comorbidity 3+;Past/Current Experience;Fitness    Comorbidities OPD, A-fib, Aortic Atherosclerosis, ischemic cardiomyopathy, Benign hypertension with chronic kidney  disease, stage III (CMS-HCC); Essential hypertension; Chronic bronchitis, unspecified chronic bronchitis type (CMS-HCC); OSA (obstructive sleep apnea); Morbid obesity with BMI of 40.0-44.9, adult (CMS-HCC)    Examination-Activity Limitations Bend;Carry;Squat;Continence;Stand;Transfers;Locomotion Level;Reach Overhead;Bed Mobility;Lift;Stairs;Toileting;Dressing    Examination-Participation Restrictions Community Activity;Laundry;Yard Work;Cleaning;Driving;Meal Prep;Shop    Stability/Clinical Decision Making Evolving/Moderate complexity    Rehab Potential Fair    PT Frequency 2x / week    PT Duration 12 weeks    PT Treatment/Interventions ADLs/Self Care Home Management;Moist Heat;Cryotherapy;Electrical Stimulation;Functional mobility training;Therapeutic activities;Therapeutic exercise;DME Instruction;Gait training;Balance training;Neuromuscular re-education;Patient/family education;Energy conservation;Biofeedback;Ultrasound;Stair training;Orthotic Fit/Training;Manual techniques;Manual lymph drainage;Dry needling;Passive range of motion;Taping;Vestibular;Visual/perceptual remediation/compensation    PT Next Visit Plan Basic standing and seated LE exercises.    Consulted and Agree with Plan of Care Patient             Patient will benefit from skilled therapeutic intervention in order to improve the following deficits and impairments:  Abnormal gait, Difficulty walking, Cardiopulmonary status limiting activity, Decreased endurance, Impaired UE functional use, Obesity, Decreased activity tolerance, Decreased balance, Improper body mechanics, Decreased mobility, Decreased strength, Impaired sensation, Postural dysfunction, Increased edema, Impaired flexibility, Decreased range of motion, Decreased coordination, Pain  Visit Diagnosis: Muscle weakness (generalized)  Other abnormalities of gait and mobility  Unsteadiness on feet     Problem List Patient Active Problem List   Diagnosis Date  Noted   Atrial fibrillation ([place]) 04/09/2019   Essential hypertension 04/09/2019   Venous stasis dermatitis of both lower extremities  04/09/2019   Lower limb ulcer, ankle, right, limited to breakdown of skin ([place]) 04/09/2019   Rhabdomyolysis 12/10/2018   Sepsis ([place]) 07/28/2018   Cough 03/20/2015   Bronchitis, chronic obstructive w acute bronchitis ([place]) 54/62/7035   Umbilical hernia without obstruction and without gangrene 01/12/2015   Ventral hernia without obstruction or gangrene 01/02/2015    Janna Arch, PT, DPT  08/19/2020, 2:35 PM  [place] [address] [place], [place], [address] Phone: [phone]   Fax:  [phone]  Name: Daniel Eaton. MRN: [id number] Date of Birth: 1940/12/20

## 2020-08-24 ENCOUNTER — Ambulatory Visit: Payer: HMO

## 2020-08-24 ENCOUNTER — Other Ambulatory Visit: Payer: Self-pay

## 2020-08-24 DIAGNOSIS — R2681 Unsteadiness on feet: Secondary | ICD-10-CM

## 2020-08-24 DIAGNOSIS — M6281 Muscle weakness (generalized): Secondary | ICD-10-CM | POA: Diagnosis not present

## 2020-08-24 DIAGNOSIS — R2689 Other abnormalities of gait and mobility: Secondary | ICD-10-CM

## 2020-08-24 NOTE — Therapy (Signed)
[place] MAIN [facility] SERVICES [address] [place], [place], [address] Phone: [phone]   Fax:  [phone]  Physical Therapy Treatment  Patient Details  Name: Daniel Eaton. MRN: [id number] Date of Birth: January 12, 1941 Referring Provider (PT): Dr. [name] MD   Encounter Date: 08/24/2020   PT End of Session - 08/24/20 1109     Visit Number 9    Number of Visits 24    Date for PT Re-Evaluation 10/07/20    Authorization Type 9/10 eval 6/1    PT Start Time 1102    PT Stop Time 1145    PT Time Calculation (min) 43 min    Equipment Utilized During Treatment Gait belt    Activity Tolerance Patient limited by fatigue;No increased pain    Behavior During Therapy WFL for tasks assessed/performed             Past Medical History:  Diagnosis Date   A-fib ([place])    Cellulitis    CHF (congestive heart failure) (HCC)    COPD (chronic obstructive pulmonary disease) ([place])    Difficult intubation    Hyperlipidemia    Hypertension    Hypothyroidism    Kidney disease    Sleep apnea    Stroke [facility])    Thyroid disease     Past Surgical History:  Procedure Laterality Date   CARDIAC CATHETERIZATION     CATARACT EXTRACTION W/ INTRAOCULAR LENS  IMPLANT, BILATERAL     COLONOSCOPY  2008   HERNIA REPAIR     bilateral inguinal hernia/ [place]   HERNIA REPAIR  02/02/2015   18 x 28 cm ventral light mesh placed laparoscopically.   TONSILLECTOMY     UMBILICAL HERNIA REPAIR N/A 02/02/2015   Procedure: HERNIA REPAIR UMBILICAL ADULT;  Surgeon: Robert Bellow, MD;  Location: [facility] ORS;  Service: General;  Laterality: N/A;   VENTRAL HERNIA REPAIR N/A 02/02/2015   Procedure: LAPAROSCOPIC VENTRAL HERNIA;  Surgeon: Robert Bellow, MD;  Location: [facility] ORS;  Service: General;  Laterality: N/A;   vp shunt placement  1979    There were no vitals filed for this visit.   Subjective Assessment - 08/24/20 1110     Subjective Patient  reports he has been compliant with HEP. No falls or LOB since last session. Has some shoulder discomfort.    Pertinent History Pt is a 80 y.o. male referred to PT for Physical deconditioning. Pt is known to this clinic and has received rehab here in 2019. Pt has a significant PMH of COPD, A-fib, Aortic Atherosclerosis, ischemic cardiomyopathy, benign hypertension with chronic kidney disease, stage III (CMS-HCC); Essential hypertension; Chronic bronchitis, unspecified chronic bronchitis type (CMS-HCC); OSA (obstructive sleep apnea); Morbid obesity with BMI of 40.0-44.9, adult (CMS-HCC) per last cardiology visit. Pt reports being unable to being able to participate in PT during his stay at [facility] this previous October and also tested positive for COVID-19.  Pt's goal is to focus on gait, balance, LE strength, and walking endurance with RW. Was last seen by this therapist on 05/27/20.    Limitations Standing;House hold activities;Walking;Lifting    How long can you sit comfortably? hour    How long can you stand comfortably? not sure    How long can you walk comfortably? walk with RW to bathroom and back    Patient Stated Goals Improve gait, balance, and LE strength. Reduce risk of falls.    Currently in Pain? Yes  Pain Score 6     Pain Location Shoulder    Pain Orientation Right;Left    Pain Descriptors / Indicators Aching    Pain Type Chronic pain    Pain Onset In the past 7 days    Pain Frequency Intermittent                Treatment: Standing with RW:  Ambulate 96 ft with RW and close CGA; one standing rest break for <5 seconds. Increased speed compared to previous session, good foot clearance bilaterally; x2 sets; second repetition improved foot clearance however did have more rest breaks required due to shortness of breathe.  Standing with # 3lb ankle weight: CGA for stability  -Hip extension with BUE upper extremity support, cueing for neutral hip alignment, upright  posture for optimal muscle recruitment, and sequencing, 10x each LE,  -Hip flexion with BUE upper extremity support, cueing for body mechanics, speed of muscle recruitment for optimal strengthening and stabilization 10x each LE -Hamstring curl with BUE upper extremity support, cueing for knee alignment for recruitment of hamstring musculature, 10x each LE   Seated with # 3lb ankle weights  -Seated marches with upright posture, back away from back of chair for abdominal/trunk activation/stabilization, 10x each LE -Seated LAQ with 3 second holds, 10x each LE, cueing for muscle activation and sequencing for neutral alignment; slight pinch in RLE reported -Seated IR/ER with cueing for stabilizing knee placement with lateral foot movement for optimal muscle recruitment, 10x each LE  -seated heel raise bilateral at the same time 10x     Seated: Scapular retractions intermittently performed throughout session for improved breathing technique, Dynadisc hamstring isometric 10 x 3 second holds, single limb at a time;   Vitals monitored throughout physical therapy session.    Education on energy conservation techniques.    Patient's vitals monitored throughout the session.  Pt educated throughout session about proper posture and technique with exercises. Improved exercise technique, movement at target joints, use of target muscles after min to mod verbal, visual, tactile cues    Patient is highly motivated throughout physical therapy session. He tolerates two ambulation trials as well as strengthening interventions with 3lb weights.  Occasional seated rest breaks required for HR elevation and shortness of breath. His RLE fatigues quicker than his left and is slightly painful with hamstring curls. Patient will benefit from skilled physical therapy to improve functional capacity of mobility, strength, and decrease fall risk to improve quality of life                    PT Education -  08/24/20 1110     Education provided Yes    Education Details exercise technique, body mechanics    Person(s) Educated Patient    Methods Explanation;Demonstration;Tactile cues;Verbal cues    Comprehension Verbalized understanding;Returned demonstration;Verbal cues required;Tactile cues required              PT Short Term Goals - 07/15/20 1546       PT SHORT TERM GOAL #1   Title Pt will be compliant with given HEP to improve LE muscle strength.    Baseline 6/1: HEP added to    Time 6    Period Weeks    Status New    Target Date 08/26/20      PT SHORT TERM GOAL #2   Title Patient will stand from standard height chair on first attempt to allow for improved mobility.     Baseline 6/1: unable  to perform    Time 6    Period Weeks    Status New    Target Date 08/26/20               PT Long Term Goals - 07/15/20 1319       PT LONG TERM GOAL #1   Title Pt will improve FOTO goal to 48 to display perceived improvements in functional mobility.    Baseline 6/1: 40%    Time 12    Period Weeks    Status New    Target Date 10/07/20      PT LONG TERM GOAL #2   Title Patient (> 79 years old) will complete five times sit to stand test in < 1 minute indicating an increased LE strength and improved balance.    Baseline 6/1: 1 min 31 seconds with heavy BUE support    Time 12    Period Weeks    Status New    Target Date 10/07/20      PT LONG TERM GOAL #3   Title Patient will increase BLE gross strength to 4+/5 as to improve functional strength for independent gait, increased standing tolerance and increased ADL ability.    Baseline 6/1: see note    Time 12    Period Weeks    Status New    Target Date 10/07/20      PT LONG TERM GOAL #4   Title Patient will increase 10 meter walk test to >1.0 m/s as to improve gait speed for better community ambulation and to reduce fall risk.    Baseline 6/1: 0.41 m/s with RW    Time 12    Period Weeks    Status New    Target Date  10/07/20      PT LONG TERM GOAL #5   Title Patient will stand without UE support >3 minutes for ADL performance and increased stability with independent movement.    Baseline 6/1: 1 min 7 seconds    Time 12    Period Weeks    Status New    Target Date 10/07/20                   Plan - 08/24/20 1131     Clinical Impression Statement Patient is highly motivated throughout physical therapy session. He tolerates two ambulation trials as well as strengthening interventions with 3lb weights.  Occasional seated rest breaks required for HR elevation and shortness of breath. His RLE fatigues quicker than his left and is slightly painful with hamstring curls. Patient will benefit from skilled physical therapy to improve functional capacity of mobility, strength, and decrease fall risk to improve quality of life    Personal Factors and Comorbidities Age;Comorbidity 3+;Past/Current Experience;Fitness    Comorbidities OPD, A-fib, Aortic Atherosclerosis, ischemic cardiomyopathy, Benign hypertension with chronic kidney disease, stage III (CMS-HCC); Essential hypertension; Chronic bronchitis, unspecified chronic bronchitis type (CMS-HCC); OSA (obstructive sleep apnea); Morbid obesity with BMI of 40.0-44.9, adult (CMS-HCC)    Examination-Activity Limitations Bend;Carry;Squat;Continence;Stand;Transfers;Locomotion Level;Reach Overhead;Bed Mobility;Lift;Stairs;Toileting;Dressing    Examination-Participation Restrictions Community Activity;Laundry;Yard Work;Cleaning;Driving;Meal Prep;Shop    Stability/Clinical Decision Making Evolving/Moderate complexity    Rehab Potential Fair    PT Frequency 2x / week    PT Duration 12 weeks    PT Treatment/Interventions ADLs/Self Care Home Management;Moist Heat;Cryotherapy;Electrical Stimulation;Functional mobility training;Therapeutic activities;Therapeutic exercise;DME Instruction;Gait training;Balance training;Neuromuscular re-education;Patient/family  education;Energy conservation;Biofeedback;Ultrasound;Stair training;Orthotic Fit/Training;Manual techniques;Manual lymph drainage;Dry needling;Passive range of motion;Taping;Vestibular;Visual/perceptual remediation/compensation    PT Next Visit Plan Basic  standing and seated LE exercises.    Consulted and Agree with Plan of Care Patient             Patient will benefit from skilled therapeutic intervention in order to improve the following deficits and impairments:  Abnormal gait, Difficulty walking, Cardiopulmonary status limiting activity, Decreased endurance, Impaired UE functional use, Obesity, Decreased activity tolerance, Decreased balance, Improper body mechanics, Decreased mobility, Decreased strength, Impaired sensation, Postural dysfunction, Increased edema, Impaired flexibility, Decreased range of motion, Decreased coordination, Pain  Visit Diagnosis: Muscle weakness (generalized)  Other abnormalities of gait and mobility  Unsteadiness on feet     Problem List Patient Active Problem List   Diagnosis Date Noted   Atrial fibrillation ([place]) 04/09/2019   Essential hypertension 04/09/2019   Venous stasis dermatitis of both lower extremities 04/09/2019   Lower limb ulcer, ankle, right, limited to breakdown of skin ([place]) 04/09/2019   Rhabdomyolysis 12/10/2018   Sepsis ([place]) 07/28/2018   Cough 03/20/2015   Bronchitis, chronic obstructive w acute bronchitis ([place]) 49/70/2637   Umbilical hernia without obstruction and without gangrene 01/12/2015   Ventral hernia without obstruction or gangrene 01/02/2015    Janna Arch, PT, DPT  08/24/2020, 11:53 AM  [place] [address] [place], [place], [address] Phone: [phone]   Fax:  [phone]  Name: Daniel Eaton. MRN: [id number] Date of Birth: August 05, 1940

## 2020-08-26 ENCOUNTER — Other Ambulatory Visit: Payer: Self-pay

## 2020-08-26 ENCOUNTER — Ambulatory Visit: Payer: HMO

## 2020-08-26 DIAGNOSIS — M6281 Muscle weakness (generalized): Secondary | ICD-10-CM

## 2020-08-26 DIAGNOSIS — R2689 Other abnormalities of gait and mobility: Secondary | ICD-10-CM

## 2020-08-26 DIAGNOSIS — R2681 Unsteadiness on feet: Secondary | ICD-10-CM

## 2020-08-26 NOTE — Therapy (Signed)
[place] MAIN [facility] SERVICES [address] [place], [place], [address] Phone: [phone]   Fax:  [phone]  Physical Therapy Treatment Physical Therapy Progress Note   Dates of reporting period  07/15/20   to   08/26/20   Patient Details  Name: Daniel Eaton. MRN: [id number] Date of Birth: 1940/12/17 Referring Provider (PT): Dr. [name] MD   Encounter Date: 08/26/2020   PT End of Session - 08/26/20 1128     Visit Number 10    Number of Visits 24    Date for PT Re-Evaluation 10/07/20    Authorization Type 10/10 eval 6/1; next session 1/10 08/26/20    PT Start Time 1119    PT Stop Time 1200    PT Time Calculation (min) 41 min    Equipment Utilized During Treatment Gait belt    Activity Tolerance Patient limited by fatigue;No increased pain    Behavior During Therapy WFL for tasks assessed/performed             Past Medical History:  Diagnosis Date   A-fib ([place])    Cellulitis    CHF (congestive heart failure) (HCC)    COPD (chronic obstructive pulmonary disease) ([place])    Difficult intubation    Hyperlipidemia    Hypertension    Hypothyroidism    Kidney disease    Sleep apnea    Stroke [facility])    Thyroid disease     Past Surgical History:  Procedure Laterality Date   CARDIAC CATHETERIZATION     CATARACT EXTRACTION W/ INTRAOCULAR LENS  IMPLANT, BILATERAL     COLONOSCOPY  2008   HERNIA REPAIR     bilateral inguinal hernia/ [place]   HERNIA REPAIR  02/02/2015   18 x 28 cm ventral light mesh placed laparoscopically.   TONSILLECTOMY     UMBILICAL HERNIA REPAIR N/A 02/02/2015   Procedure: HERNIA REPAIR UMBILICAL ADULT;  Surgeon: Robert Bellow, MD;  Location: [facility] ORS;  Service: General;  Laterality: N/A;   VENTRAL HERNIA REPAIR N/A 02/02/2015   Procedure: LAPAROSCOPIC VENTRAL HERNIA;  Surgeon: Robert Bellow, MD;  Location: [facility] ORS;  Service: General;  Laterality: N/A;   vp shunt placement  1979     There were no vitals filed for this visit.   Subjective Assessment - 08/26/20 1457     Subjective Patient is eager to perform his goals this session. Has been compliant with HEP.    Pertinent History Pt is a 80 y.o. male referred to PT for Physical deconditioning. Pt is known to this clinic and has received rehab here in 2019. Pt has a significant PMH of COPD, A-fib, Aortic Atherosclerosis, ischemic cardiomyopathy, benign hypertension with chronic kidney disease, stage III (CMS-HCC); Essential hypertension; Chronic bronchitis, unspecified chronic bronchitis type (CMS-HCC); OSA (obstructive sleep apnea); Morbid obesity with BMI of 40.0-44.9, adult (CMS-HCC) per last cardiology visit. Pt reports being unable to being able to participate in PT during his stay at [facility] this previous October and also tested positive for COVID-19.  Pt's goal is to focus on gait, balance, LE strength, and walking endurance with RW. Was last seen by this therapist on 05/27/20.    Limitations Standing;House hold activities;Walking;Lifting    How long can you sit comfortably? hour    How long can you stand comfortably? not sure    How long can you walk comfortably? walk with RW to bathroom and back    Patient Stated Goals Improve gait,  balance, and LE strength. Reduce risk of falls.    Currently in Pain? Yes    Pain Score --   no number given   Pain Location --   total body soreness   Pain Descriptors / Indicators Aching    Pain Type Chronic pain    Pain Onset In the past 7 days    Pain Frequency Intermittent              Goals:  Stand from standard height chair on first try: able to do : MET FOTO: 45%  5x STS: 49 seconds with heavy BUE support  BLE strength  Right Left  Hip flexion 4 3+  Hip Abduction 4 4-  Hip Adduction 4- 3+  Knee Extension  4 4  Knee Flexion 4- 4-  DF 3+ 3+  PF 3+ 3+    10 MWT: 15.96  ; 0.6 m/s with RW  Stand without UE support 3 minutes : 3 min 3 seconds    Treat Nustep Lvl 1-3, seat position 13, arms at 11; RPM> 60; 5 minutes for cardiovascular and musculoskeletal challenge  Ambulate 10 M with equal step length and picking up feet with RW and cGA   Patient's condition has the potential to improve in response to therapy. Maximum improvement is yet to be obtained. The anticipated improvement is attainable and reasonable in a generally predictable time.  Patient reports he is very motivated to improve but is not yet where he wants to be yet. Patient reports he is 70% better.    Pt educated throughout session about proper posture and technique with exercises. Improved exercise technique, movement at target joints, use of target muscles after min to mod verbal, visual, tactile cues  Patient has met his 5x STS goal and static stand goal which will be progressed to be more challenging at next recert. He has improved FOTO, strength, and 10 MWT scores indicating overall improved mobility and stability. Patient has been very motivated and it shows in his progress. Patient's condition has the potential to improve in response to therapy. Maximum improvement is yet to be obtained. The anticipated improvement is attainable and reasonable in a generally predictable time. Patient will benefit from skilled physical therapy to improve functional capacity of mobility, strength, and decrease fall risk to improve quality of life                 PT Education - 08/26/20 1129     Education provided Yes    Education Details goals, progress note    Person(s) Educated Patient    Methods Explanation;Demonstration;Tactile cues;Verbal cues    Comprehension Verbalized understanding;Returned demonstration;Verbal cues required;Tactile cues required              PT Short Term Goals - 08/26/20 1154       PT SHORT TERM GOAL #1   Title Pt will be compliant with given HEP to improve LE muscle strength.    Baseline 6/1: HEP added to 7/13: HEP compliant     Time 6    Period Weeks    Status Achieved    Target Date 08/26/20      PT SHORT TERM GOAL #2   Title Patient will stand from standard height chair on first attempt to allow for improved mobility.     Baseline 6/1: unable to perform 7/13: MET    Time 6    Period Weeks    Status Achieved    Target Date 08/26/20  PT Long Term Goals - 08/26/20 1150       PT LONG TERM GOAL #1   Title Pt will improve FOTO goal to 48 to display perceived improvements in functional mobility.    Baseline 6/1: 40% 7/13: 45%    Time 12    Period Weeks    Status Partially Met    Target Date 10/07/20      PT LONG TERM GOAL #2   Title Patient (> 24 years old) will complete five times sit to stand test in < 1 minute indicating an increased LE strength and improved balance.    Baseline 6/1: 1 min 31 seconds with heavy BUE support 7/13: 49 seconds with heavy BUE support    Time 12    Period Weeks    Status Achieved      PT LONG TERM GOAL #3   Title Patient will increase BLE gross strength to 4+/5 as to improve functional strength for independent gait, increased standing tolerance and increased ADL ability.    Baseline 6/1: see note 7/13: see note    Time 12    Period Weeks    Status Partially Met    Target Date 10/07/20      PT LONG TERM GOAL #4   Title Patient will increase 10 meter walk test to >1.0 m/s as to improve gait speed for better community ambulation and to reduce fall risk.    Baseline 6/1: 0.41 m/s with RW 7/13: 0.6 m/s with walker    Time 12    Period Weeks    Status Partially Met    Target Date 10/07/20      PT LONG TERM GOAL #5   Title Patient will stand without UE support >3 minutes for ADL performance and increased stability with independent movement.    Baseline 6/1: 1 min 7 seconds 7/13: 3 min 3 seconds    Time 12    Period Weeks    Status Achieved                   Plan - 08/26/20 1500     Clinical Impression Statement Patient has met his 5x  STS goal and static stand goal which will be progressed to be more challenging at next recert. He has improved FOTO, strength, and 10 MWT scores indicating overall improved mobility and stability. Patient has been very motivated and it shows in his progress. Patient's condition has the potential to improve in response to therapy. Maximum improvement is yet to be obtained. The anticipated improvement is attainable and reasonable in a generally predictable time. Patient will benefit from skilled physical therapy to improve functional capacity of mobility, strength, and decrease fall risk to improve quality of life    Personal Factors and Comorbidities Age;Comorbidity 3+;Past/Current Experience;Fitness    Comorbidities OPD, A-fib, Aortic Atherosclerosis, ischemic cardiomyopathy, Benign hypertension with chronic kidney disease, stage III (CMS-HCC); Essential hypertension; Chronic bronchitis, unspecified chronic bronchitis type (CMS-HCC); OSA (obstructive sleep apnea); Morbid obesity with BMI of 40.0-44.9, adult (CMS-HCC)    Examination-Activity Limitations Bend;Carry;Squat;Continence;Stand;Transfers;Locomotion Level;Reach Overhead;Bed Mobility;Lift;Stairs;Toileting;Dressing    Examination-Participation Restrictions Community Activity;Laundry;Yard Work;Cleaning;Driving;Meal Prep;Shop    Stability/Clinical Decision Making Evolving/Moderate complexity    Rehab Potential Fair    PT Frequency 2x / week    PT Duration 12 weeks    PT Treatment/Interventions ADLs/Self Care Home Management;Moist Heat;Cryotherapy;Electrical Stimulation;Functional mobility training;Therapeutic activities;Therapeutic exercise;DME Instruction;Gait training;Balance training;Neuromuscular re-education;Patient/family education;Energy conservation;Biofeedback;Ultrasound;Stair training;Orthotic Fit/Training;Manual techniques;Manual lymph drainage;Dry needling;Passive range of motion;Taping;Vestibular;Visual/perceptual remediation/compensation  PT Next Visit Plan Basic standing and seated LE exercises.    Consulted and Agree with Plan of Care Patient             Patient will benefit from skilled therapeutic intervention in order to improve the following deficits and impairments:  Abnormal gait, Difficulty walking, Cardiopulmonary status limiting activity, Decreased endurance, Impaired UE functional use, Obesity, Decreased activity tolerance, Decreased balance, Improper body mechanics, Decreased mobility, Decreased strength, Impaired sensation, Postural dysfunction, Increased edema, Impaired flexibility, Decreased range of motion, Decreased coordination, Pain  Visit Diagnosis: Muscle weakness (generalized)  Other abnormalities of gait and mobility  Unsteadiness on feet     Problem List Patient Active Problem List   Diagnosis Date Noted   Atrial fibrillation ([place]) 04/09/2019   Essential hypertension 04/09/2019   Venous stasis dermatitis of both lower extremities 04/09/2019   Lower limb ulcer, ankle, right, limited to breakdown of skin ([place]) 04/09/2019   Rhabdomyolysis 12/10/2018   Sepsis ([place]) 07/28/2018   Cough 03/20/2015   Bronchitis, chronic obstructive w acute bronchitis ([place]) 88/28/0034   Umbilical hernia without obstruction and without gangrene 01/12/2015   Ventral hernia without obstruction or gangrene 01/02/2015   Janna Arch, PT, DPT  08/26/2020, 3:00 PM  [place] [address] [place], [place], [address] Phone: [phone]   Fax:  [phone]  Name: Josyah Achor. MRN: [id number] Date of Birth: 01-Mar-1940

## 2020-08-31 ENCOUNTER — Ambulatory Visit: Payer: HMO

## 2020-08-31 ENCOUNTER — Other Ambulatory Visit: Payer: Self-pay

## 2020-08-31 DIAGNOSIS — R2681 Unsteadiness on feet: Secondary | ICD-10-CM

## 2020-08-31 DIAGNOSIS — M6281 Muscle weakness (generalized): Secondary | ICD-10-CM

## 2020-08-31 DIAGNOSIS — R2689 Other abnormalities of gait and mobility: Secondary | ICD-10-CM

## 2020-08-31 NOTE — Therapy (Signed)
[place] MAIN [facility] SERVICES [address] [place], [place], [address] Phone: [phone]   Fax:  [phone]  Physical Therapy Treatment  Patient Details  Name: Daniel Eaton. MRN: [id number] Date of Birth: Mar 31, 1940 Referring Provider (PT): Dr. [name] MD   Encounter Date: 08/31/2020   PT End of Session - 08/31/20 1110     Visit Number 11    Number of Visits 24    Date for PT Re-Evaluation 10/07/20    Authorization Type 1/10 08/26/20    PT Start Time 1059    PT Stop Time 1144    PT Time Calculation (min) 45 min    Equipment Utilized During Treatment Gait belt    Activity Tolerance Patient limited by fatigue;No increased pain    Behavior During Therapy WFL for tasks assessed/performed             Past Medical History:  Diagnosis Date   A-fib ([place])    Cellulitis    CHF (congestive heart failure) (HCC)    COPD (chronic obstructive pulmonary disease) ([place])    Difficult intubation    Hyperlipidemia    Hypertension    Hypothyroidism    Kidney disease    Sleep apnea    Stroke [facility])    Thyroid disease     Past Surgical History:  Procedure Laterality Date   CARDIAC CATHETERIZATION     CATARACT EXTRACTION W/ INTRAOCULAR LENS  IMPLANT, BILATERAL     COLONOSCOPY  2008   HERNIA REPAIR     bilateral inguinal hernia/ [place]   HERNIA REPAIR  02/02/2015   18 x 28 cm ventral light mesh placed laparoscopically.   TONSILLECTOMY     UMBILICAL HERNIA REPAIR N/A 02/02/2015   Procedure: HERNIA REPAIR UMBILICAL ADULT;  Surgeon: Robert Bellow, MD;  Location: [facility] ORS;  Service: General;  Laterality: N/A;   VENTRAL HERNIA REPAIR N/A 02/02/2015   Procedure: LAPAROSCOPIC VENTRAL HERNIA;  Surgeon: Robert Bellow, MD;  Location: [facility] ORS;  Service: General;  Laterality: N/A;   vp shunt placement  1979    There were no vitals filed for this visit.   Subjective Assessment - 08/31/20 1109     Subjective Patient  reports compliance with HEP. No falls or LOB since last session. No pain just soreness.    Pertinent History Pt is a 80 y.o. male referred to PT for Physical deconditioning. Pt is known to this clinic and has received rehab here in 2019. Pt has a significant PMH of COPD, A-fib, Aortic Atherosclerosis, ischemic cardiomyopathy, benign hypertension with chronic kidney disease, stage III (CMS-HCC); Essential hypertension; Chronic bronchitis, unspecified chronic bronchitis type (CMS-HCC); OSA (obstructive sleep apnea); Morbid obesity with BMI of 40.0-44.9, adult (CMS-HCC) per last cardiology visit. Pt reports being unable to being able to participate in PT during his stay at [facility] this previous October and also tested positive for COVID-19.  Pt's goal is to focus on gait, balance, LE strength, and walking endurance with RW. Was last seen by this therapist on 05/27/20.    Limitations Standing;House hold activities;Walking;Lifting    How long can you sit comfortably? hour    How long can you stand comfortably? not sure    How long can you walk comfortably? walk with RW to bathroom and back    Patient Stated Goals Improve gait, balance, and LE strength. Reduce risk of falls.    Currently in Pain? No/denies  Standing in // bars:     Ambulating without UE support in // bar able to perform 2x without LOB    Side-stepping with 2.5 ankle weights within // bars, cues for foot clearance x4 lengths of // bars; extreme shortness of breathe requiring standing break between reps    High knee marching forward, backwards stepping back with #2.5 ankle weight 6x length of // bars with BUE support; HR elevation for 143  6" step toe taps with 2.5 ankle weights ; 10x each LE; BUE support  Three way hedgehogs 8x each LE, BUE support (front, side, backwards stepping).    GTB march with focus of bringing hip from posterior position upright with control and power 10x each LE, BUE support    seated: Scapular retraction multiple sets throughout session Alternating UE/LE cross body raises 10x each side; unable to alternate yet at this time. Only able to do 10x each side then switch  Alternating ER/IR 10x each LE    Vitals monitored throughout session to ensure therapeutic range.     Pt educated throughout session about proper posture and technique with exercises. Improved exercise technique, movement at target joints, use of target muscles after min to mod verbal, visual, tactile cues      Patient is highly motivated throughout physical therapy session. He is able to return to therapeutic range of vitals quicker than previous session indicating improved functional level of mobility. He is gaining strength as can be seen in performance of interventions that were challenging previously with a weight this session. Patient will benefit from skilled physical therapy to improve functional capacity of mobility, strength, and decrease fall risk to improve quality of life                 PT Education - 08/31/20 1110     Education provided Yes    Education Details exercise technique, body mechanics    Person(s) Educated Patient    Methods Explanation;Demonstration;Tactile cues;Verbal cues    Comprehension Verbalized understanding;Returned demonstration;Verbal cues required;Tactile cues required              PT Short Term Goals - 08/26/20 1154       PT SHORT TERM GOAL #1   Title Pt will be compliant with given HEP to improve LE muscle strength.    Baseline 6/1: HEP added to 7/13: HEP compliant    Time 6    Period Weeks    Status Achieved    Target Date 08/26/20      PT SHORT TERM GOAL #2   Title Patient will stand from standard height chair on first attempt to allow for improved mobility.     Baseline 6/1: unable to perform 7/13: MET    Time 6    Period Weeks    Status Achieved    Target Date 08/26/20               PT Long Term Goals - 08/26/20  1150       PT LONG TERM GOAL #1   Title Pt will improve FOTO goal to 48 to display perceived improvements in functional mobility.    Baseline 6/1: 40% 7/13: 45%    Time 12    Period Weeks    Status Partially Met    Target Date 10/07/20      PT LONG TERM GOAL #2   Title Patient (> 47 years old) will complete five times sit to stand test in < 1 minute indicating an  increased LE strength and improved balance.    Baseline 6/1: 1 min 31 seconds with heavy BUE support 7/13: 49 seconds with heavy BUE support    Time 12    Period Weeks    Status Achieved      PT LONG TERM GOAL #3   Title Patient will increase BLE gross strength to 4+/5 as to improve functional strength for independent gait, increased standing tolerance and increased ADL ability.    Baseline 6/1: see note 7/13: see note    Time 12    Period Weeks    Status Partially Met    Target Date 10/07/20      PT LONG TERM GOAL #4   Title Patient will increase 10 meter walk test to >1.0 m/s as to improve gait speed for better community ambulation and to reduce fall risk.    Baseline 6/1: 0.41 m/s with RW 7/13: 0.6 m/s with walker    Time 12    Period Weeks    Status Partially Met    Target Date 10/07/20      PT LONG TERM GOAL #5   Title Patient will stand without UE support >3 minutes for ADL performance and increased stability with independent movement.    Baseline 6/1: 1 min 7 seconds 7/13: 3 min 3 seconds    Time 12    Period Weeks    Status Achieved                   Plan - 08/31/20 1134     Clinical Impression Statement Patient is highly motivated throughout physical therapy session. He is able to return to therapeutic range of vitals quicker than previous session indicating improved functional level of mobility. He is gaining strength as can be seen in performance of interventions that were challenging previously with a weight this session. Patient will benefit from skilled physical therapy to improve  functional capacity of mobility, strength, and decrease fall risk to improve quality of life    Personal Factors and Comorbidities Age;Comorbidity 3+;Past/Current Experience;Fitness    Comorbidities OPD, A-fib, Aortic Atherosclerosis, ischemic cardiomyopathy, Benign hypertension with chronic kidney disease, stage III (CMS-HCC); Essential hypertension; Chronic bronchitis, unspecified chronic bronchitis type (CMS-HCC); OSA (obstructive sleep apnea); Morbid obesity with BMI of 40.0-44.9, adult (CMS-HCC)    Examination-Activity Limitations Bend;Carry;Squat;Continence;Stand;Transfers;Locomotion Level;Reach Overhead;Bed Mobility;Lift;Stairs;Toileting;Dressing    Examination-Participation Restrictions Community Activity;Laundry;Yard Work;Cleaning;Driving;Meal Prep;Shop    Stability/Clinical Decision Making Evolving/Moderate complexity    Rehab Potential Fair    PT Frequency 2x / week    PT Duration 12 weeks    PT Treatment/Interventions ADLs/Self Care Home Management;Moist Heat;Cryotherapy;Electrical Stimulation;Functional mobility training;Therapeutic activities;Therapeutic exercise;DME Instruction;Gait training;Balance training;Neuromuscular re-education;Patient/family education;Energy conservation;Biofeedback;Ultrasound;Stair training;Orthotic Fit/Training;Manual techniques;Manual lymph drainage;Dry needling;Passive range of motion;Taping;Vestibular;Visual/perceptual remediation/compensation    PT Next Visit Plan Basic standing and seated LE exercises.    Consulted and Agree with Plan of Care Patient             Patient will benefit from skilled therapeutic intervention in order to improve the following deficits and impairments:  Abnormal gait, Difficulty walking, Cardiopulmonary status limiting activity, Decreased endurance, Impaired UE functional use, Obesity, Decreased activity tolerance, Decreased balance, Improper body mechanics, Decreased mobility, Decreased strength, Impaired sensation,  Postural dysfunction, Increased edema, Impaired flexibility, Decreased range of motion, Decreased coordination, Pain  Visit Diagnosis: Muscle weakness (generalized)  Other abnormalities of gait and mobility  Unsteadiness on feet     Problem List Patient Active Problem List   Diagnosis Date Noted  Atrial fibrillation ([place]) 04/09/2019   Essential hypertension 04/09/2019   Venous stasis dermatitis of both lower extremities 04/09/2019   Lower limb ulcer, ankle, right, limited to breakdown of skin ([place]) 04/09/2019   Rhabdomyolysis 12/10/2018   Sepsis ([place]) 07/28/2018   Cough 03/20/2015   Bronchitis, chronic obstructive w acute bronchitis ([place]) 12/13/1312   Umbilical hernia without obstruction and without gangrene 01/12/2015   Ventral hernia without obstruction or gangrene 01/02/2015   Janna Arch, PT, DPT  08/31/2020, 11:50 AM  [place] [address] [place], [place], [address] Phone: [phone]   Fax:  [phone]  Name: Daniel Eaton. MRN: [id number] Date of Birth: [birth year]

## 2020-09-02 ENCOUNTER — Other Ambulatory Visit: Payer: Self-pay

## 2020-09-02 ENCOUNTER — Ambulatory Visit: Payer: HMO

## 2020-09-02 DIAGNOSIS — R2681 Unsteadiness on feet: Secondary | ICD-10-CM

## 2020-09-02 DIAGNOSIS — R2689 Other abnormalities of gait and mobility: Secondary | ICD-10-CM

## 2020-09-02 DIAGNOSIS — M6281 Muscle weakness (generalized): Secondary | ICD-10-CM | POA: Diagnosis not present

## 2020-09-02 NOTE — Therapy (Signed)
[place] MAIN [facility] SERVICES [address] [place], [place], [address] Phone: [phone]   Fax:  [phone]  Physical Therapy Treatment  Patient Details  Name: Daniel Eaton. MRN: [id number] Date of Birth: 1940-09-22 Referring Provider (PT): Dr. [name] MD   Encounter Date: 09/02/2020   PT End of Session - 09/02/20 1507     Visit Number 12    Number of Visits 24    Date for PT Re-Evaluation 10/07/20    Authorization Type 2/10 08/26/20    PT Start Time 1457    PT Stop Time 1530    PT Time Calculation (min) 33 min    Equipment Utilized During Treatment Gait belt    Activity Tolerance Patient limited by fatigue;No increased pain    Behavior During Therapy WFL for tasks assessed/performed             Past Medical History:  Diagnosis Date   A-fib ([place])    Cellulitis    CHF (congestive heart failure) (HCC)    COPD (chronic obstructive pulmonary disease) ([place])    Difficult intubation    Hyperlipidemia    Hypertension    Hypothyroidism    Kidney disease    Sleep apnea    Stroke [facility])    Thyroid disease     Past Surgical History:  Procedure Laterality Date   CARDIAC CATHETERIZATION     CATARACT EXTRACTION W/ INTRAOCULAR LENS  IMPLANT, BILATERAL     COLONOSCOPY  2008   HERNIA REPAIR     bilateral inguinal hernia/ [facility]   HERNIA REPAIR  02/02/2015   18 x 28 cm ventral light mesh placed laparoscopically.   TONSILLECTOMY     UMBILICAL HERNIA REPAIR N/A 02/02/2015   Procedure: HERNIA REPAIR UMBILICAL ADULT;  Surgeon: Robert Bellow, MD;  Location: [facility] ORS;  Service: General;  Laterality: N/A;   VENTRAL HERNIA REPAIR N/A 02/02/2015   Procedure: LAPAROSCOPIC VENTRAL HERNIA;  Surgeon: Robert Bellow, MD;  Location: [facility] ORS;  Service: General;  Laterality: N/A;   vp shunt placement  1979    There were no vitals filed for this visit.   Subjective Assessment - 09/02/20 1504     Subjective Patient  reports compliance with HEP. No falls or LOB. Is late to PT session due to mixup at the front desk and at top    Pertinent History Pt is a 80 y.o. male referred to PT for Physical deconditioning. Pt is known to this clinic and has received rehab here in 2019. Pt has a significant PMH of COPD, A-fib, Aortic Atherosclerosis, ischemic cardiomyopathy, benign hypertension with chronic kidney disease, stage III (CMS-HCC); Essential hypertension; Chronic bronchitis, unspecified chronic bronchitis type (CMS-HCC); OSA (obstructive sleep apnea); Morbid obesity with BMI of 40.0-44.9, adult (CMS-HCC) per last cardiology visit. Pt reports being unable to being able to participate in PT during his stay at [facility] this previous October and also tested positive for COVID-19.  Pt's goal is to focus on gait, balance, LE strength, and walking endurance with RW. Was last seen by this therapist on 05/27/20.    Limitations Standing;House hold activities;Walking;Lifting    How long can you sit comfortably? hour    How long can you stand comfortably? not sure    How long can you walk comfortably? walk with RW to bathroom and back    Patient Stated Goals Improve gait, balance, and LE strength. Reduce risk of falls.    Currently in Pain?  No/denies                       Treatment: Standing with RW:   Ambulate 96 ft with RW and close CGA; one standing rest break for <5 seconds. Increased speed compared to previous session, good foot clearance bilaterally; x2 sets; second repetition improved foot clearance however did have more rest breaks required due to shortness of breathe.  HR > 130 after first set; HR> 119 after second set  Standing with RW UE raises alternating with no UE support 10x each UE   Seated: 10x STS with focus on hand placement and pressing through LE's.patient reports very hard. HR elevation to 120  Scapular retractions intermittently performed throughout session for improved breathing  technique, Dynadisc hamstring isometric 10 x 3 second holds, single limb at a time;  Soccer ball kicks x 10 each LE for coordination, spatial awareness and sequencing.    Patient's vitals monitored throughout the session.  Pt educated throughout session about proper posture and technique with exercises. Improved exercise technique, movement at target joints, use of target muscles after min to mod verbal, visual, tactile cues                    PT Education - 09/02/20 1507     Education provided Yes    Education Details exercise technique, body mechanics    Person(s) Educated Patient    Methods Explanation;Demonstration;Verbal cues;Tactile cues    Comprehension Verbalized understanding;Returned demonstration;Verbal cues required;Tactile cues required              PT Short Term Goals - 08/26/20 1154       PT SHORT TERM GOAL #1   Title Pt will be compliant with given HEP to improve LE muscle strength.    Baseline 6/1: HEP added to 7/13: HEP compliant    Time 6    Period Weeks    Status Achieved    Target Date 08/26/20      PT SHORT TERM GOAL #2   Title Patient will stand from standard height chair on first attempt to allow for improved mobility.     Baseline 6/1: unable to perform 7/13: MET    Time 6    Period Weeks    Status Achieved    Target Date 08/26/20               PT Long Term Goals - 08/26/20 1150       PT LONG TERM GOAL #1   Title Pt will improve FOTO goal to 48 to display perceived improvements in functional mobility.    Baseline 6/1: 40% 7/13: 45%    Time 12    Period Weeks    Status Partially Met    Target Date 10/07/20      PT LONG TERM GOAL #2   Title Patient (> 41 years old) will complete five times sit to stand test in < 1 minute indicating an increased LE strength and improved balance.    Baseline 6/1: 1 min 31 seconds with heavy BUE support 7/13: 49 seconds with heavy BUE support    Time 12    Period Weeks    Status  Achieved      PT LONG TERM GOAL #3   Title Patient will increase BLE gross strength to 4+/5 as to improve functional strength for independent gait, increased standing tolerance and increased ADL ability.    Baseline 6/1: see note 7/13: see note  Time 12    Period Weeks    Status Partially Met    Target Date 10/07/20      PT LONG TERM GOAL #4   Title Patient will increase 10 meter walk test to >1.0 m/s as to improve gait speed for better community ambulation and to reduce fall risk.    Baseline 6/1: 0.41 m/s with RW 7/13: 0.6 m/s with walker    Time 12    Period Weeks    Status Partially Met    Target Date 10/07/20      PT LONG TERM GOAL #5   Title Patient will stand without UE support >3 minutes for ADL performance and increased stability with independent movement.    Baseline 6/1: 1 min 7 seconds 7/13: 3 min 3 seconds    Time 12    Period Weeks    Status Achieved                   Plan - 09/02/20 1517     Clinical Impression Statement Patient session limited by late arrival and misunderstanding at front desk. Patient is highly motivated throughout session and is eager to progress his mobility and independence. He is more short of breath with interventions this session. His gait mechanics are improving with equality of step length and decreased trunk flexion with fatigue.  Patient will benefit from skilled physical therapy to improve functional capacity of mobility, strength, and decrease fall risk to improve quality of life    Personal Factors and Comorbidities Age;Comorbidity 3+;Past/Current Experience;Fitness    Comorbidities OPD, A-fib, Aortic Atherosclerosis, ischemic cardiomyopathy, Benign hypertension with chronic kidney disease, stage III (CMS-HCC); Essential hypertension; Chronic bronchitis, unspecified chronic bronchitis type (CMS-HCC); OSA (obstructive sleep apnea); Morbid obesity with BMI of 40.0-44.9, adult (CMS-HCC)    Examination-Activity Limitations  Bend;Carry;Squat;Continence;Stand;Transfers;Locomotion Level;Reach Overhead;Bed Mobility;Lift;Stairs;Toileting;Dressing    Examination-Participation Restrictions Community Activity;Laundry;Yard Work;Cleaning;Driving;Meal Prep;Shop    Stability/Clinical Decision Making Evolving/Moderate complexity    Rehab Potential Fair    PT Frequency 2x / week    PT Duration 12 weeks    PT Treatment/Interventions ADLs/Self Care Home Management;Moist Heat;Cryotherapy;Electrical Stimulation;Functional mobility training;Therapeutic activities;Therapeutic exercise;DME Instruction;Gait training;Balance training;Neuromuscular re-education;Patient/family education;Energy conservation;Biofeedback;Ultrasound;Stair training;Orthotic Fit/Training;Manual techniques;Manual lymph drainage;Dry needling;Passive range of motion;Taping;Vestibular;Visual/perceptual remediation/compensation    PT Next Visit Plan Basic standing and seated LE exercises.    Consulted and Agree with Plan of Care Patient             Patient will benefit from skilled therapeutic intervention in order to improve the following deficits and impairments:  Abnormal gait, Difficulty walking, Cardiopulmonary status limiting activity, Decreased endurance, Impaired UE functional use, Obesity, Decreased activity tolerance, Decreased balance, Improper body mechanics, Decreased mobility, Decreased strength, Impaired sensation, Postural dysfunction, Increased edema, Impaired flexibility, Decreased range of motion, Decreased coordination, Pain  Visit Diagnosis: Muscle weakness (generalized)  Other abnormalities of gait and mobility  Unsteadiness on feet     Problem List Patient Active Problem List   Diagnosis Date Noted   Atrial fibrillation ([place]) 04/09/2019   Essential hypertension 04/09/2019   Venous stasis dermatitis of both lower extremities 04/09/2019   Lower limb ulcer, ankle, right, limited to breakdown of skin ([place]) 04/09/2019   Rhabdomyolysis  12/10/2018   Sepsis ([place]) 07/28/2018   Cough 03/20/2015   Bronchitis, chronic obstructive w acute bronchitis ([place]) 01/74/9449   Umbilical hernia without obstruction and without gangrene 01/12/2015   Ventral hernia without obstruction or gangrene 01/02/2015   Janna Arch, PT, DPT  09/02/2020, 3:34 PM  [place] MAIN [facility] SERVICES [address] [place], [place], [address] Phone: [phone]   Fax:  [phone]  Name: Steffon Gladu. MRN: [id number] Date of Birth: 1940/06/29

## 2020-09-03 DIAGNOSIS — M79674 Pain in right toe(s): Secondary | ICD-10-CM | POA: Diagnosis not present

## 2020-09-03 DIAGNOSIS — M79675 Pain in left toe(s): Secondary | ICD-10-CM | POA: Diagnosis not present

## 2020-09-03 DIAGNOSIS — B351 Tinea unguium: Secondary | ICD-10-CM | POA: Diagnosis not present

## 2020-09-07 ENCOUNTER — Ambulatory Visit: Payer: HMO

## 2020-09-07 DIAGNOSIS — R2681 Unsteadiness on feet: Secondary | ICD-10-CM

## 2020-09-07 DIAGNOSIS — M6281 Muscle weakness (generalized): Secondary | ICD-10-CM

## 2020-09-07 DIAGNOSIS — R2689 Other abnormalities of gait and mobility: Secondary | ICD-10-CM

## 2020-09-09 ENCOUNTER — Ambulatory Visit: Payer: HMO

## 2020-09-14 ENCOUNTER — Ambulatory Visit: Payer: HMO | Attending: Internal Medicine

## 2020-09-14 ENCOUNTER — Other Ambulatory Visit: Payer: Self-pay

## 2020-09-14 DIAGNOSIS — R2689 Other abnormalities of gait and mobility: Secondary | ICD-10-CM | POA: Diagnosis not present

## 2020-09-14 DIAGNOSIS — R262 Difficulty in walking, not elsewhere classified: Secondary | ICD-10-CM | POA: Insufficient documentation

## 2020-09-14 DIAGNOSIS — R269 Unspecified abnormalities of gait and mobility: Secondary | ICD-10-CM | POA: Insufficient documentation

## 2020-09-14 DIAGNOSIS — M6281 Muscle weakness (generalized): Secondary | ICD-10-CM | POA: Diagnosis not present

## 2020-09-14 DIAGNOSIS — R2681 Unsteadiness on feet: Secondary | ICD-10-CM | POA: Diagnosis not present

## 2020-09-14 NOTE — Therapy (Signed)
[place] MAIN [facility] SERVICES [address] [place], [place], [address] Phone: [phone]   Fax:  [phone]  Physical Therapy Treatment  Patient Details  Name: Daniel Eaton. MRN: [id number] Date of Birth: 04-28-1940 Referring Provider (PT): Dr. [name] MD   Encounter Date: 09/14/2020   PT End of Session - 09/14/20 1121     Visit Number 13    Number of Visits 24    Date for PT Re-Evaluation 10/07/20    Authorization Type 3/10 08/26/20    PT Start Time 1101    PT Stop Time 1145    PT Time Calculation (min) 44 min    Equipment Utilized During Treatment Gait belt    Activity Tolerance Patient limited by fatigue;No increased pain    Behavior During Therapy WFL for tasks assessed/performed             Past Medical History:  Diagnosis Date   A-fib ([place])    Cellulitis    CHF (congestive heart failure) (HCC)    COPD (chronic obstructive pulmonary disease) ([place])    Difficult intubation    Hyperlipidemia    Hypertension    Hypothyroidism    Kidney disease    Sleep apnea    Stroke [facility])    Thyroid disease     Past Surgical History:  Procedure Laterality Date   CARDIAC CATHETERIZATION     CATARACT EXTRACTION W/ INTRAOCULAR LENS  IMPLANT, BILATERAL     COLONOSCOPY  2008   HERNIA REPAIR     bilateral inguinal hernia/ [facility]   HERNIA REPAIR  02/02/2015   18 x 28 cm ventral light mesh placed laparoscopically.   TONSILLECTOMY     UMBILICAL HERNIA REPAIR N/A 02/02/2015   Procedure: HERNIA REPAIR UMBILICAL ADULT;  Surgeon: Robert Bellow, MD;  Location: [facility] ORS;  Service: General;  Laterality: N/A;   VENTRAL HERNIA REPAIR N/A 02/02/2015   Procedure: LAPAROSCOPIC VENTRAL HERNIA;  Surgeon: Robert Bellow, MD;  Location: [facility] ORS;  Service: General;  Laterality: N/A;   vp shunt placement  1979    There were no vitals filed for this visit.   Subjective Assessment - 09/14/20 1109     Subjective Patient missed  last few sessions due to wounds on legs requiring bandaging. Has been compliant with HEP.    Pertinent History Pt is a 80 y.o. male referred to PT for Physical deconditioning. Pt is known to this clinic and has received rehab here in 2019. Pt has a significant PMH of COPD, A-fib, Aortic Atherosclerosis, ischemic cardiomyopathy, benign hypertension with chronic kidney disease, stage III (CMS-HCC); Essential hypertension; Chronic bronchitis, unspecified chronic bronchitis type (CMS-HCC); OSA (obstructive sleep apnea); Morbid obesity with BMI of 40.0-44.9, adult (CMS-HCC) per last cardiology visit. Pt reports being unable to being able to participate in PT during his stay at [facility] this previous October and also tested positive for COVID-19.  Pt's goal is to focus on gait, balance, LE strength, and walking endurance with RW. Was last seen by this therapist on 05/27/20.    Limitations Standing;House hold activities;Walking;Lifting    How long can you sit comfortably? hour    How long can you stand comfortably? not sure    How long can you walk comfortably? walk with RW to bathroom and back    Patient Stated Goals Improve gait, balance, and LE strength. Reduce risk of falls.    Currently in Pain? No/denies  Treatment: Standing with RW:   Ambulate 96 ft with RW and close CGA; one standing rest break for <5 seconds. Increased speed compared to previous session, good foot clearance bilaterally; x2 sets; second repetition improved foot clearance however did have more rest breaks required due to shortness of breathe.    Standing with RW Standing upright, reach for ball and throw at target x 8 balls, x2 sets, occasional SUE support required.    Seated: 10x STS with focus on hand placement and pressing through LE's.patient reports very hard. Cue to reach full stand with RW Scapular retractions intermittently performed throughout session for improved breathing technique, Dynadisc  hamstring isometric 10 x 3 second holds, single limb at a time;  Soccer ball kicks x 10 each LE for coordination, spatial awareness and sequencing.    Patient's vitals monitored throughout the session.  Pt educated throughout session about proper posture and technique with exercises. Improved exercise technique, movement at target joints, use of target muscles after min to mod verbal, visual, tactile cues    Patient is highly motivated throughout physical therapy session. He is returning to therapy after brief absence. Slight increase in fatigue is noted this session compared to previous sessions due to absence indicating need for continued therapy. Patient will benefit from skilled physical therapy to improve functional capacity of mobility, strength, and decrease fall risk to improve quality of life                             PT Education - 09/14/20 1110     Education provided Yes    Education Details exercise technique, body mechanics    Person(s) Educated Patient    Methods Other (comment);Explanation;Demonstration;Tactile cues;Verbal cues    Comprehension Verbalized understanding;Returned demonstration;Verbal cues required;Tactile cues required              PT Short Term Goals - 08/26/20 1154       PT SHORT TERM GOAL #1   Title Pt will be compliant with given HEP to improve LE muscle strength.    Baseline 6/1: HEP added to 7/13: HEP compliant    Time 6    Period Weeks    Status Achieved    Target Date 08/26/20      PT SHORT TERM GOAL #2   Title Patient will stand from standard height chair on first attempt to allow for improved mobility.     Baseline 6/1: unable to perform 7/13: MET    Time 6    Period Weeks    Status Achieved    Target Date 08/26/20               PT Long Term Goals - 08/26/20 1150       PT LONG TERM GOAL #1   Title Pt will improve FOTO goal to 48 to display perceived improvements in functional mobility.    Baseline 6/1:  40% 7/13: 45%    Time 12    Period Weeks    Status Partially Met    Target Date 10/07/20      PT LONG TERM GOAL #2   Title Patient (> 36 years old) will complete five times sit to stand test in < 1 minute indicating an increased LE strength and improved balance.    Baseline 6/1: 1 min 31 seconds with heavy BUE support 7/13: 49 seconds with heavy BUE support    Time 12    Period Weeks  Status Achieved      PT LONG TERM GOAL #3   Title Patient will increase BLE gross strength to 4+/5 as to improve functional strength for independent gait, increased standing tolerance and increased ADL ability.    Baseline 6/1: see note 7/13: see note    Time 12    Period Weeks    Status Partially Met    Target Date 10/07/20      PT LONG TERM GOAL #4   Title Patient will increase 10 meter walk test to >1.0 m/s as to improve gait speed for better community ambulation and to reduce fall risk.    Baseline 6/1: 0.41 m/s with RW 7/13: 0.6 m/s with walker    Time 12    Period Weeks    Status Partially Met    Target Date 10/07/20      PT LONG TERM GOAL #5   Title Patient will stand without UE support >3 minutes for ADL performance and increased stability with independent movement.    Baseline 6/1: 1 min 7 seconds 7/13: 3 min 3 seconds    Time 12    Period Weeks    Status Achieved                   Plan - 09/14/20 1133     Clinical Impression Statement Patient is highly motivated throughout physical therapy session. He is returning to therapy after brief absence. Slight increase in fatigue is noted this session compared to previous sessions due to absence indicating need for continued therapy. Patient will benefit from skilled physical therapy to improve functional capacity of mobility, strength, and decrease fall risk to improve quality of life    Personal Factors and Comorbidities Age;Comorbidity 3+;Past/Current Experience;Fitness    Comorbidities OPD, A-fib, Aortic Atherosclerosis,  ischemic cardiomyopathy, Benign hypertension with chronic kidney disease, stage III (CMS-HCC); Essential hypertension; Chronic bronchitis, unspecified chronic bronchitis type (CMS-HCC); OSA (obstructive sleep apnea); Morbid obesity with BMI of 40.0-44.9, adult (CMS-HCC)    Examination-Activity Limitations Bend;Carry;Squat;Continence;Stand;Transfers;Locomotion Level;Reach Overhead;Bed Mobility;Lift;Stairs;Toileting;Dressing    Examination-Participation Restrictions Community Activity;Laundry;Yard Work;Cleaning;Driving;Meal Prep;Shop    Stability/Clinical Decision Making Evolving/Moderate complexity    Rehab Potential Fair    PT Frequency 2x / week    PT Duration 12 weeks    PT Treatment/Interventions ADLs/Self Care Home Management;Moist Heat;Cryotherapy;Electrical Stimulation;Functional mobility training;Therapeutic activities;Therapeutic exercise;DME Instruction;Gait training;Balance training;Neuromuscular re-education;Patient/family education;Energy conservation;Biofeedback;Ultrasound;Stair training;Orthotic Fit/Training;Manual techniques;Manual lymph drainage;Dry needling;Passive range of motion;Taping;Vestibular;Visual/perceptual remediation/compensation    PT Next Visit Plan Basic standing and seated LE exercises.    Consulted and Agree with Plan of Care Patient             Patient will benefit from skilled therapeutic intervention in order to improve the following deficits and impairments:  Abnormal gait, Difficulty walking, Cardiopulmonary status limiting activity, Decreased endurance, Impaired UE functional use, Obesity, Decreased activity tolerance, Decreased balance, Improper body mechanics, Decreased mobility, Decreased strength, Impaired sensation, Postural dysfunction, Increased edema, Impaired flexibility, Decreased range of motion, Decreased coordination, Pain  Visit Diagnosis: Muscle weakness (generalized)  Other abnormalities of gait and mobility  Unsteadiness on  feet     Problem List Patient Active Problem List   Diagnosis Date Noted   Atrial fibrillation ([place]) 04/09/2019   Essential hypertension 04/09/2019   Venous stasis dermatitis of both lower extremities 04/09/2019   Lower limb ulcer, ankle, right, limited to breakdown of skin ([place]) 04/09/2019   Rhabdomyolysis 12/10/2018   Sepsis ([place]) 07/28/2018   Cough 03/20/2015   Bronchitis, chronic obstructive  w acute bronchitis ([place]) 83/77/9396   Umbilical hernia without obstruction and without gangrene 01/12/2015   Ventral hernia without obstruction or gangrene 01/02/2015    Janna Arch, PT, DPT  09/14/2020, 12:12 PM  [place] [address] [place], [place], [address] Phone: [phone]   Fax:  [phone]  Name: Daniel Eaton. MRN: [id number] Date of Birth: [birth year]

## 2020-09-16 DIAGNOSIS — J449 Chronic obstructive pulmonary disease, unspecified: Secondary | ICD-10-CM | POA: Diagnosis not present

## 2020-09-16 DIAGNOSIS — G4733 Obstructive sleep apnea (adult) (pediatric): Secondary | ICD-10-CM | POA: Diagnosis not present

## 2020-09-17 ENCOUNTER — Ambulatory Visit: Payer: HMO

## 2020-09-22 ENCOUNTER — Encounter: Payer: Self-pay | Admitting: Physical Therapy

## 2020-09-22 ENCOUNTER — Ambulatory Visit: Payer: HMO | Admitting: Physical Therapy

## 2020-09-22 ENCOUNTER — Other Ambulatory Visit: Payer: Self-pay

## 2020-09-22 DIAGNOSIS — R2689 Other abnormalities of gait and mobility: Secondary | ICD-10-CM

## 2020-09-22 DIAGNOSIS — M6281 Muscle weakness (generalized): Secondary | ICD-10-CM

## 2020-09-22 DIAGNOSIS — R2681 Unsteadiness on feet: Secondary | ICD-10-CM

## 2020-09-22 NOTE — Therapy (Signed)
[place] MAIN [facility] SERVICES [address] [place], [place], [address] Phone: [phone]   Fax:  [phone]  Physical Therapy Treatment  Patient Details  Name: Daniel Eaton. MRN: [id number] Date of Birth: March 07, 1940 Referring Provider (PT): Dr. [name] MD   Encounter Date: 09/22/2020   PT End of Session - 09/22/20 1026     Visit Number 14    Number of Visits 24    Date for PT Re-Evaluation 10/07/20    Authorization Type 3/10 08/26/20    PT Start Time 1021    PT Stop Time 1100    PT Time Calculation (min) 39 min    Equipment Utilized During Treatment Gait belt    Activity Tolerance Patient limited by fatigue;No increased pain    Behavior During Therapy WFL for tasks assessed/performed             Past Medical History:  Diagnosis Date   A-fib ([place])    Cellulitis    CHF (congestive heart failure) (HCC)    COPD (chronic obstructive pulmonary disease) ([place])    Difficult intubation    Hyperlipidemia    Hypertension    Hypothyroidism    Kidney disease    Sleep apnea    Stroke [facility])    Thyroid disease     Past Surgical History:  Procedure Laterality Date   CARDIAC CATHETERIZATION     CATARACT EXTRACTION W/ INTRAOCULAR LENS  IMPLANT, BILATERAL     COLONOSCOPY  2008   HERNIA REPAIR     bilateral inguinal hernia/ [facility]   HERNIA REPAIR  02/02/2015   18 x 28 cm ventral light mesh placed laparoscopically.   TONSILLECTOMY     UMBILICAL HERNIA REPAIR N/A 02/02/2015   Procedure: HERNIA REPAIR UMBILICAL ADULT;  Surgeon: Robert Bellow, MD;  Location: [facility] ORS;  Service: General;  Laterality: N/A;   VENTRAL HERNIA REPAIR N/A 02/02/2015   Procedure: LAPAROSCOPIC VENTRAL HERNIA;  Surgeon: Robert Bellow, MD;  Location: [facility] ORS;  Service: General;  Laterality: N/A;   vp shunt placement  1979    There were no vitals filed for this visit.   Subjective Assessment - 09/22/20 1023     Subjective Patient  reports doing well. He reports usual aches and pains. Reports adherence with HEP and states he does his exercises daily. No new complaints.    Pertinent History Pt is a 80 y.o. male referred to PT for Physical deconditioning. Pt is known to this clinic and has received rehab here in 2019. Pt has a significant PMH of COPD, A-fib, Aortic Atherosclerosis, ischemic cardiomyopathy, benign hypertension with chronic kidney disease, stage III (CMS-HCC); Essential hypertension; Chronic bronchitis, unspecified chronic bronchitis type (CMS-HCC); OSA (obstructive sleep apnea); Morbid obesity with BMI of 40.0-44.9, adult (CMS-HCC) per last cardiology visit. Pt reports being unable to being able to participate in PT during his stay at [facility] this previous October and also tested positive for COVID-19.  Pt's goal is to focus on gait, balance, LE strength, and walking endurance with RW. Was last seen by this therapist on 05/27/20.    Limitations Standing;House hold activities;Walking;Lifting    How long can you sit comfortably? hour    How long can you stand comfortably? not sure    How long can you walk comfortably? walk with RW to bathroom and back    Patient Stated Goals Improve gait, balance, and LE strength. Reduce risk of falls.    Currently in Pain?  No/denies    Multiple Pain Sites No                  TREATMENT: Standing with 2.5# ankle weights: Standing in parallel bars: -hip flexion march x10 reps  bilaterally, cues to slow down LE movement and increase ROM for better strengthening While standing, patient reports increased shortness of breath, SPo2 99%, HR 100 bpm;  -Heel raises x10 reps;  -Hip abduction SLR x10 reps each LE with BUE rail assist, min VCs for postural control -Hamstring curl x10 reps with cues to avoid excessive knee bend to reduce risk of muscle cramps;   Following standing exercise, vitals assessed, HR 121 bpm, SPo2 99%  Standing scapular retraction low rows x15 reps  to facilitate better postural control;   Seated with 2.5# ankle weights: -LAQ with ankle DF x15 reps each LE with min VCs to increase ankle DF for better stretch in posterior leg while improving quad strength and control;    Standing in parallel bars: -forward/backward walking x10 feet x3 reps with intermittent rail assist, working on reducing HHA to challenge dynamic balance;  Required cues to increase erect posture and improve step length for better gait safety;  -Side stepping in parallel bars x10 feet x3 laps each direction. First lap was able to complete with intermittent finger tip hold, required 2 HHA for subsequent laps due to fatigue.   Standing in parallel bars: -Standing hip flexion march x10 reps with BUE rail assist and min VCs for erect posture -mini squat with BUE rail assist x10 reps with min VCs for proper positioning;   Patient motivated and participated well within session. Vitals monitored throughout session. Spo2 99% with HR ranging from 100-121 bpm; Patient tolerated session well. He reports  moderate  fatigue with 4-6 RPE, at end of session. Denies any increase in pain;                  PT Education - 09/22/20 1024     Education provided Yes    Education Details exercise technique, LE strengthening;    Person(s) Educated Patient    Methods Explanation;Verbal cues    Comprehension Verbalized understanding;Returned demonstration;Verbal cues required;Need further instruction              PT Short Term Goals - 08/26/20 1154       PT SHORT TERM GOAL #1   Title Pt will be compliant with given HEP to improve LE muscle strength.    Baseline 6/1: HEP added to 7/13: HEP compliant    Time 6    Period Weeks    Status Achieved    Target Date 08/26/20      PT SHORT TERM GOAL #2   Title Patient will stand from standard height chair on first attempt to allow for improved mobility.     Baseline 6/1: unable to perform 7/13: MET    Time 6    Period  Weeks    Status Achieved    Target Date 08/26/20               PT Long Term Goals - 08/26/20 1150       PT LONG TERM GOAL #1   Title Pt will improve FOTO goal to 48 to display perceived improvements in functional mobility.    Baseline 6/1: 40% 7/13: 45%    Time 12    Period Weeks    Status Partially Met    Target Date 10/07/20  PT LONG TERM GOAL #2   Title Patient (> 84 years old) will complete five times sit to stand test in < 1 minute indicating an increased LE strength and improved balance.    Baseline 6/1: 1 min 31 seconds with heavy BUE support 7/13: 49 seconds with heavy BUE support    Time 12    Period Weeks    Status Achieved      PT LONG TERM GOAL #3   Title Patient will increase BLE gross strength to 4+/5 as to improve functional strength for independent gait, increased standing tolerance and increased ADL ability.    Baseline 6/1: see note 7/13: see note    Time 12    Period Weeks    Status Partially Met    Target Date 10/07/20      PT LONG TERM GOAL #4   Title Patient will increase 10 meter walk test to >1.0 m/s as to improve gait speed for better community ambulation and to reduce fall risk.    Baseline 6/1: 0.41 m/s with RW 7/13: 0.6 m/s with walker    Time 12    Period Weeks    Status Partially Met    Target Date 10/07/20      PT LONG TERM GOAL #5   Title Patient will stand without UE support >3 minutes for ADL performance and increased stability with independent movement.    Baseline 6/1: 1 min 7 seconds 7/13: 3 min 3 seconds    Time 12    Period Weeks    Status Achieved                   Plan - 09/22/20 1035     Clinical Impression Statement Patient motivated and participated well within session. He was instructed in advanced LE strengthening exercise. Patient does require min VCS for proper positioning and exercise technique. Throughout session he reports feeling short of breath, especially with prolonged standing. Vitals  monitored. Patient does require short seated/standing rest breaks. He would benefit from additional skilled PT intervention to improve strength, balance and mobility;    Personal Factors and Comorbidities Age;Comorbidity 3+;Past/Current Experience;Fitness    Comorbidities OPD, A-fib, Aortic Atherosclerosis, ischemic cardiomyopathy, Benign hypertension with chronic kidney disease, stage III (CMS-HCC); Essential hypertension; Chronic bronchitis, unspecified chronic bronchitis type (CMS-HCC); OSA (obstructive sleep apnea); Morbid obesity with BMI of 40.0-44.9, adult (CMS-HCC)    Examination-Activity Limitations Bend;Carry;Squat;Continence;Stand;Transfers;Locomotion Level;Reach Overhead;Bed Mobility;Lift;Stairs;Toileting;Dressing    Examination-Participation Restrictions Community Activity;Laundry;Yard Work;Cleaning;Driving;Meal Prep;Shop    Stability/Clinical Decision Making Evolving/Moderate complexity    Rehab Potential Fair    PT Frequency 2x / week    PT Duration 12 weeks    PT Treatment/Interventions ADLs/Self Care Home Management;Moist Heat;Cryotherapy;Electrical Stimulation;Functional mobility training;Therapeutic activities;Therapeutic exercise;DME Instruction;Gait training;Balance training;Neuromuscular re-education;Patient/family education;Energy conservation;Biofeedback;Ultrasound;Stair training;Orthotic Fit/Training;Manual techniques;Manual lymph drainage;Dry needling;Passive range of motion;Taping;Vestibular;Visual/perceptual remediation/compensation    PT Next Visit Plan Basic standing and seated LE exercises.    Consulted and Agree with Plan of Care Patient             Patient will benefit from skilled therapeutic intervention in order to improve the following deficits and impairments:  Abnormal gait, Difficulty walking, Cardiopulmonary status limiting activity, Decreased endurance, Impaired UE functional use, Obesity, Decreased activity tolerance, Decreased balance, Improper body  mechanics, Decreased mobility, Decreased strength, Impaired sensation, Postural dysfunction, Increased edema, Impaired flexibility, Decreased range of motion, Decreased coordination, Pain  Visit Diagnosis: Muscle weakness (generalized)  Other abnormalities of gait and mobility  Unsteadiness on feet  Problem List Patient Active Problem List   Diagnosis Date Noted   Atrial fibrillation ([place]) 04/09/2019   Essential hypertension 04/09/2019   Venous stasis dermatitis of both lower extremities 04/09/2019   Lower limb ulcer, ankle, right, limited to breakdown of skin ([place]) 04/09/2019   Rhabdomyolysis 12/10/2018   Sepsis ([place]) 07/28/2018   Cough 03/20/2015   Bronchitis, chronic obstructive w acute bronchitis ([place]) 00/26/2854   Umbilical hernia without obstruction and without gangrene 01/12/2015   Ventral hernia without obstruction or gangrene 01/02/2015    Maleiah Dula PT, DPT 09/22/2020, 2:16 PM  [place], [place], [address] Phone: [phone]   Fax:  [phone]  Name: Daniel Eaton. MRN: [id number] Date of Birth: December 28, 1940

## 2020-09-23 DIAGNOSIS — G4733 Obstructive sleep apnea (adult) (pediatric): Secondary | ICD-10-CM | POA: Diagnosis not present

## 2020-09-23 DIAGNOSIS — I4819 Other persistent atrial fibrillation: Secondary | ICD-10-CM | POA: Diagnosis not present

## 2020-09-23 DIAGNOSIS — J42 Unspecified chronic bronchitis: Secondary | ICD-10-CM | POA: Diagnosis not present

## 2020-09-23 DIAGNOSIS — I700 Atherosclerosis of aorta: Secondary | ICD-10-CM | POA: Diagnosis not present

## 2020-09-23 DIAGNOSIS — I429 Cardiomyopathy, unspecified: Secondary | ICD-10-CM | POA: Diagnosis not present

## 2020-09-23 DIAGNOSIS — I255 Ischemic cardiomyopathy: Secondary | ICD-10-CM | POA: Diagnosis not present

## 2020-09-23 DIAGNOSIS — Z6841 Body Mass Index (BMI) 40.0 and over, adult: Secondary | ICD-10-CM | POA: Diagnosis not present

## 2020-09-29 ENCOUNTER — Encounter: Payer: Self-pay | Admitting: Physical Therapy

## 2020-09-29 ENCOUNTER — Other Ambulatory Visit: Payer: Self-pay

## 2020-09-29 ENCOUNTER — Ambulatory Visit: Payer: HMO | Admitting: Physical Therapy

## 2020-09-29 DIAGNOSIS — M6281 Muscle weakness (generalized): Secondary | ICD-10-CM

## 2020-09-29 DIAGNOSIS — R2689 Other abnormalities of gait and mobility: Secondary | ICD-10-CM

## 2020-09-29 DIAGNOSIS — R2681 Unsteadiness on feet: Secondary | ICD-10-CM

## 2020-09-29 NOTE — Therapy (Signed)
[place] MAIN [facility] SERVICES [address] [place], [place], [address] Phone: [phone]   Fax:  [phone]  Physical Therapy Treatment  Patient Details  Name: Daniel Eaton. MRN: [id number] Date of Birth: 1940/12/11 Referring Provider (PT): Dr. [name] MD   Encounter Date: 09/29/2020   PT End of Session - 09/29/20 1146     Visit Number 15    Number of Visits 24    Date for PT Re-Evaluation 10/07/20    Authorization Type 5/10 08/26/20    PT Start Time 1140    PT Stop Time 1220    PT Time Calculation (min) 40 min    Equipment Utilized During Treatment Gait belt    Activity Tolerance Patient limited by fatigue;No increased pain    Behavior During Therapy WFL for tasks assessed/performed             Past Medical History:  Diagnosis Date   A-fib ([place])    Cellulitis    CHF (congestive heart failure) (HCC)    COPD (chronic obstructive pulmonary disease) ([place])    Difficult intubation    Hyperlipidemia    Hypertension    Hypothyroidism    Kidney disease    Sleep apnea    Stroke [facility])    Thyroid disease     Past Surgical History:  Procedure Laterality Date   CARDIAC CATHETERIZATION     CATARACT EXTRACTION W/ INTRAOCULAR LENS  IMPLANT, BILATERAL     COLONOSCOPY  2008   HERNIA REPAIR     bilateral inguinal hernia/ [facility]   HERNIA REPAIR  02/02/2015   18 x 28 cm ventral light mesh placed laparoscopically.   TONSILLECTOMY     UMBILICAL HERNIA REPAIR N/A 02/02/2015   Procedure: HERNIA REPAIR UMBILICAL ADULT;  Surgeon: Robert Bellow, MD;  Location: [facility] ORS;  Service: General;  Laterality: N/A;   VENTRAL HERNIA REPAIR N/A 02/02/2015   Procedure: LAPAROSCOPIC VENTRAL HERNIA;  Surgeon: Robert Bellow, MD;  Location: [facility] ORS;  Service: General;  Laterality: N/A;   vp shunt placement  1979    There were no vitals filed for this visit.   Subjective Assessment - 09/29/20 1145     Subjective Patient  reports doing well. he did see cardiology last week. No new med changes and cardiologist recommended patient keep exercising and stay the course. He reports no new falls. Reports it took him 1 day to recover after last session which means he was working hard which is a good thing.    Pertinent History Pt is a 80 y.o. male referred to PT for Physical deconditioning. Pt is known to this clinic and has received rehab here in 2019. Pt has a significant PMH of COPD, A-fib, Aortic Atherosclerosis, ischemic cardiomyopathy, benign hypertension with chronic kidney disease, stage III (CMS-HCC); Essential hypertension; Chronic bronchitis, unspecified chronic bronchitis type (CMS-HCC); OSA (obstructive sleep apnea); Morbid obesity with BMI of 40.0-44.9, adult (CMS-HCC) per last cardiology visit. Pt reports being unable to being able to participate in PT during his stay at [facility] this previous October and also tested positive for COVID-19.  Pt's goal is to focus on gait, balance, LE strength, and walking endurance with RW. Was last seen by this therapist on 05/27/20.    Limitations Standing;House hold activities;Walking;Lifting    How long can you sit comfortably? hour    How long can you stand comfortably? not sure    How long can you walk comfortably? walk with  RW to bathroom and back    Patient Stated Goals Improve gait, balance, and LE strength. Reduce risk of falls.    Currently in Pain? No/denies    Multiple Pain Sites No             TREATMENT: Gait with RW x150 feet with 3-4 short standing rest breaks; Required min VCs to increase pursed lip breathing for better breath control; Able to exhibit good reciprocal gait pattern; Reports feeling good about walking that distance in that his legs didn't feel like they were going to collapse. He felt like his shortness of breath was biggest limiting factor with gait today;  Vitals: HR 120, Spo2 100%  Standing with 2.5# ankle weights: Standing in parallel  bars: -hip flexion march x10 reps  bilaterally, cues to slow down LE movement and increase ROM for better strengthening -Heel raises x10 reps with cues to reduce UE weight bearing to challenge stance control, CGA for safety;  -Hip abduction SLR x10 reps each LE with BUE rail assist, min VCs for postural control  Following standing exercise, vitals assessed, HR 110 bpm, SPo2 100%   Standing scapular retraction low rows x15 reps to facilitate better postural control;    Seated with 2.5# ankle weights: -LAQ with ankle DF x15 reps each LE with min VCs to increase ankle DF for better stretch in posterior leg while improving quad strength and control;      Standing in parallel bars: Forward/backward step over 1/2 bolster with BUE rail assist x5 reps each;  Required min VCs to increase heel strike when stepping forward, CGA for safety; Side step over 1/2 bolster with 2 HHA on rail x5 reps each direction with min VCs to increase step length for better foot clearance;   Patient motivated and participated well within session. Vitals monitored throughout session. Spo2 99% with HR ranging from 100-120 bpm; Patient tolerated session well.  Denies any increase in pain;                                   PT Education - 09/29/20 1146     Education provided Yes    Education Details exercise technique, LE strengthening;    Person(s) Educated Patient    Methods Explanation;Verbal cues    Comprehension Verbalized understanding;Returned demonstration;Verbal cues required;Need further instruction              PT Short Term Goals - 08/26/20 1154       PT SHORT TERM GOAL #1   Title Pt will be compliant with given HEP to improve LE muscle strength.    Baseline 6/1: HEP added to 7/13: HEP compliant    Time 6    Period Weeks    Status Achieved    Target Date 08/26/20      PT SHORT TERM GOAL #2   Title Patient will stand from standard height chair on first attempt to  allow for improved mobility.     Baseline 6/1: unable to perform 7/13: MET    Time 6    Period Weeks    Status Achieved    Target Date 08/26/20               PT Long Term Goals - 08/26/20 1150       PT LONG TERM GOAL #1   Title Pt will improve FOTO goal to 48 to display perceived improvements in functional mobility.  Baseline 6/1: 40% 7/13: 45%    Time 12    Period Weeks    Status Partially Met    Target Date 10/07/20      PT LONG TERM GOAL #2   Title Patient (> 103 years old) will complete five times sit to stand test in < 1 minute indicating an increased LE strength and improved balance.    Baseline 6/1: 1 min 31 seconds with heavy BUE support 7/13: 49 seconds with heavy BUE support    Time 12    Period Weeks    Status Achieved      PT LONG TERM GOAL #3   Title Patient will increase BLE gross strength to 4+/5 as to improve functional strength for independent gait, increased standing tolerance and increased ADL ability.    Baseline 6/1: see note 7/13: see note    Time 12    Period Weeks    Status Partially Met    Target Date 10/07/20      PT LONG TERM GOAL #4   Title Patient will increase 10 meter walk test to >1.0 m/s as to improve gait speed for better community ambulation and to reduce fall risk.    Baseline 6/1: 0.41 m/s with RW 7/13: 0.6 m/s with walker    Time 12    Period Weeks    Status Partially Met    Target Date 10/07/20      PT LONG TERM GOAL #5   Title Patient will stand without UE support >3 minutes for ADL performance and increased stability with independent movement.    Baseline 6/1: 1 min 7 seconds 7/13: 3 min 3 seconds    Time 12    Period Weeks    Status Achieved                   Plan - 09/29/20 1419     Clinical Impression Statement Patient motivated and participated well within session. He was instructed in advanced LE strengthening exercise. He does require min VCs for proper positioning and exercise technique. Attempted to  reduce rail assist as tolerated to increase weight bearing in BLE for better strengthening. He does fatigue quickly requiring short standing/seated rest breaks. Patient was able to progress gait to 150 feet with 3-4 standing rest breaks. He was pleased that he was able to walk that distance without legs buckling. Patient would benefit from additional skilled PT Intervention to improve strength, balance and mobility;    Personal Factors and Comorbidities Age;Comorbidity 3+;Past/Current Experience;Fitness    Comorbidities OPD, A-fib, Aortic Atherosclerosis, ischemic cardiomyopathy, Benign hypertension with chronic kidney disease, stage III (CMS-HCC); Essential hypertension; Chronic bronchitis, unspecified chronic bronchitis type (CMS-HCC); OSA (obstructive sleep apnea); Morbid obesity with BMI of 40.0-44.9, adult (CMS-HCC)    Examination-Activity Limitations Bend;Carry;Squat;Continence;Stand;Transfers;Locomotion Level;Reach Overhead;Bed Mobility;Lift;Stairs;Toileting;Dressing    Examination-Participation Restrictions Community Activity;Laundry;Yard Work;Cleaning;Driving;Meal Prep;Shop    Stability/Clinical Decision Making Evolving/Moderate complexity    Rehab Potential Fair    PT Frequency 2x / week    PT Duration 12 weeks    PT Treatment/Interventions ADLs/Self Care Home Management;Moist Heat;Cryotherapy;Electrical Stimulation;Functional mobility training;Therapeutic activities;Therapeutic exercise;DME Instruction;Gait training;Balance training;Neuromuscular re-education;Patient/family education;Energy conservation;Biofeedback;Ultrasound;Stair training;Orthotic Fit/Training;Manual techniques;Manual lymph drainage;Dry needling;Passive range of motion;Taping;Vestibular;Visual/perceptual remediation/compensation    PT Next Visit Plan Basic standing and seated LE exercises.    Consulted and Agree with Plan of Care Patient             Patient will benefit from skilled therapeutic intervention in order  to improve the  following deficits and impairments:  Abnormal gait, Difficulty walking, Cardiopulmonary status limiting activity, Decreased endurance, Impaired UE functional use, Obesity, Decreased activity tolerance, Decreased balance, Improper body mechanics, Decreased mobility, Decreased strength, Impaired sensation, Postural dysfunction, Increased edema, Impaired flexibility, Decreased range of motion, Decreased coordination, Pain  Visit Diagnosis: Muscle weakness (generalized)  Unsteadiness on feet  Other abnormalities of gait and mobility     Problem List Patient Active Problem List   Diagnosis Date Noted   Atrial fibrillation ([place]) 04/09/2019   Essential hypertension 04/09/2019   Venous stasis dermatitis of both lower extremities 04/09/2019   Lower limb ulcer, ankle, right, limited to breakdown of skin ([place]) 04/09/2019   Rhabdomyolysis 12/10/2018   Sepsis ([place]) 07/28/2018   Cough 03/20/2015   Bronchitis, chronic obstructive w acute bronchitis ([place]) 62/69/4854   Umbilical hernia without obstruction and without gangrene 01/12/2015   Ventral hernia without obstruction or gangrene 01/02/2015    Ilyana Manuele PT, DPT 09/29/2020, 2:23 PM  [place] [address] [place], [place], [address] Phone: [phone]   Fax:  [phone]  Name: Daniel Eaton. MRN: [id number] Date of Birth: 1940/09/11

## 2020-10-01 ENCOUNTER — Other Ambulatory Visit: Payer: Self-pay

## 2020-10-01 ENCOUNTER — Ambulatory Visit: Payer: HMO

## 2020-10-01 ENCOUNTER — Telehealth: Payer: Self-pay

## 2020-10-01 DIAGNOSIS — R269 Unspecified abnormalities of gait and mobility: Secondary | ICD-10-CM

## 2020-10-01 DIAGNOSIS — M6281 Muscle weakness (generalized): Secondary | ICD-10-CM | POA: Diagnosis not present

## 2020-10-01 DIAGNOSIS — R262 Difficulty in walking, not elsewhere classified: Secondary | ICD-10-CM

## 2020-10-01 NOTE — Therapy (Signed)
[facility] MAIN REHAB SERVICES [address] [place], [place], [address] Phone: [phone]   Fax:  [phone]  Physical Therapy Treatment  Patient Details  Name: Daniel L Sobieski Jr. MRN: [id number] Date of Birth: 11/30/1940 Referring Provider (PT): Dr. Anderson, Marshall W MD   Encounter Date: 10/01/2020   PT End of Session - 10/01/20 1636     Visit Number 16    Number of Visits 24    Date for PT Re-Evaluation 10/07/20    Authorization Type 5/10 08/26/20    PT Start Time 1621    PT Stop Time 1706    PT Time Calculation (min) 45 min    Equipment Utilized During Treatment Gait belt    Activity Tolerance Patient limited by fatigue;No increased pain    Behavior During Therapy WFL for tasks assessed/performed             Past Medical History:  Diagnosis Date   A-fib (HCC)    Cellulitis    CHF (congestive heart failure) (HCC)    COPD (chronic obstructive pulmonary disease) (HCC)    Difficult intubation    Hyperlipidemia    Hypertension    Hypothyroidism    Kidney disease    Sleep apnea    Stroke (HCC)    Thyroid disease     Past Surgical History:  Procedure Laterality Date   CARDIAC CATHETERIZATION     CATARACT EXTRACTION W/ INTRAOCULAR LENS  IMPLANT, BILATERAL     COLONOSCOPY  2008   HERNIA REPAIR     bilateral inguinal hernia/ [facility]   HERNIA REPAIR  02/02/2015   18 x 28 cm ventral light mesh placed laparoscopically.   TONSILLECTOMY     UMBILICAL HERNIA REPAIR N/A 02/02/2015   Procedure: HERNIA REPAIR UMBILICAL ADULT;  Surgeon:  W Byrnett, MD;  Location: [facility] ORS;  Service: General;  Laterality: N/A;   VENTRAL HERNIA REPAIR N/A 02/02/2015   Procedure: LAPAROSCOPIC VENTRAL HERNIA;  Surgeon:  W Byrnett, MD;  Location: [facility] ORS;  Service: General;  Laterality: N/A;   vp shunt placement  1979    There were no vitals filed for this visit.   Subjective Assessment - 10/01/20 1626     Subjective Patient  reports doing well since last visit and feels he has made some progress.    Pertinent History Pt is a 80 y.o. male referred to PT for Physical deconditioning. Pt is known to this clinic and has received rehab here in 2019. Pt has a significant PMH of COPD, A-fib, Aortic Atherosclerosis, ischemic cardiomyopathy, benign hypertension with chronic kidney disease, stage III (CMS-HCC); Essential hypertension; Chronic bronchitis, unspecified chronic bronchitis type (CMS-HCC); OSA (obstructive sleep apnea); Morbid obesity with BMI of 40.0-44.9, adult (CMS-HCC) per last cardiology visit. Pt reports being unable to being able to participate in PT during his stay at [facility] this previous October and also tested positive for COVID-19.  Pt's goal is to focus on gait, balance, LE strength, and walking endurance with RW. Was last seen by this therapist on 05/27/20.    Limitations Standing;House hold activities;Walking;Lifting    How long can you sit comfortably? hour    How long can you stand comfortably? not sure    How long can you walk comfortably? walk with RW to bathroom and back    Patient Stated Goals Improve gait, balance, and LE strength. Reduce risk of falls.    Currently in Pain? Yes   pain in left shoulder with movement but   none at rest   Pain Location Shoulder    Pain Orientation Left    Pain Descriptors / Indicators Aching    Pain Type Chronic pain    Pain Onset More than a month ago    Pain Frequency Constant    Aggravating Factors  active movement    Effect of Pain on Daily Activities difficulty with ADL's             Interventions:     Ambulation with RW x150 feet, CGA with w/c follow with 3 short standing rest breaks. Patient able to  exhibit good reciprocal gait pattern; Reports feeling good about walking that distance in that his legs didn't feel like they were going to collapse. He felt like his shortness of breath was biggest limiting factor with gait today;  Vitals: HR 120,  Spo2 100%  Standing at side of TM holding onto railing: -hip flexion march/step tap onto belt of TM  x10 reps  BLE, cues to slow down LE movement and increase ROM for better strengthening -Hip abduction SLR x10 reps each LE with BUE rail assist, min VCs for postural control  Sit to stand from straight back chair - VC's to scoot forward for improved technique. Patient responded- able to follow cues and successfully standing 10x  with BUE support.  HR 110 bpm, O2 sat 97%       Standing at TM:  Forward/backward step over 1/2 bolster with BUE rail assist x2 reps each;  Required min VCs for step length and patient stopped secondary did not feel comfortable  Side step over 1/2 bolster with 2 HHA on rail x5 reps each direction with min VCs to increase step length for better foot clearance. Patient able to respond well and take larger side steps with practice and cues.   Clinical Impression: Patient demonstrated continued good motivation and participation today with all walking and standing activities. Vitals monitored throughout session. Spo2 97% with HR ranging from 100-120 bpm. He continues to demo fatigue and weakness as limiting factor along with SOB with exertion. Patient would benefit from additional skilled PT Intervention to improve strength, balance and mobility.                       PT Education - 10/01/20 1635     Education provided Yes    Education Details Exercise technique    Person(s) Educated Patient    Methods Explanation;Demonstration;Tactile cues;Verbal cues    Comprehension Verbalized understanding;Returned demonstration;Verbal cues required;Tactile cues required;Need further instruction              PT Short Term Goals - 08/26/20 1154       PT SHORT TERM GOAL #1   Title Pt will be compliant with given HEP to improve LE muscle strength.    Baseline 6/1: HEP added to 7/13: HEP compliant    Time 6    Period Weeks    Status Achieved    Target  Date 08/26/20      PT SHORT TERM GOAL #2   Title Patient will stand from standard height chair on first attempt to allow for improved mobility.     Baseline 6/1: unable to perform 7/13: MET    Time 6    Period Weeks    Status Achieved    Target Date 08/26/20               PT Long Term Goals - 08/26/20 1150  PT LONG TERM GOAL #1   Title Pt will improve FOTO goal to 48 to display perceived improvements in functional mobility.    Baseline 6/1: 40% 7/13: 45%    Time 12    Period Weeks    Status Partially Met    Target Date 10/07/20      PT LONG TERM GOAL #2   Title Patient (> 20 years old) will complete five times sit to stand test in < 1 minute indicating an increased LE strength and improved balance.    Baseline 6/1: 1 min 31 seconds with heavy BUE support 7/13: 49 seconds with heavy BUE support    Time 12    Period Weeks    Status Achieved      PT LONG TERM GOAL #3   Title Patient will increase BLE gross strength to 4+/5 as to improve functional strength for independent gait, increased standing tolerance and increased ADL ability.    Baseline 6/1: see note 7/13: see note    Time 12    Period Weeks    Status Partially Met    Target Date 10/07/20      PT LONG TERM GOAL #4   Title Patient will increase 10 meter walk test to >1.0 m/s as to improve gait speed for better community ambulation and to reduce fall risk.    Baseline 6/1: 0.41 m/s with RW 7/13: 0.6 m/s with walker    Time 12    Period Weeks    Status Partially Met    Target Date 10/07/20      PT LONG TERM GOAL #5   Title Patient will stand without UE support >3 minutes for ADL performance and increased stability with independent movement.    Baseline 6/1: 1 min 7 seconds 7/13: 3 min 3 seconds    Time 12    Period Weeks    Status Achieved                   Plan - 10/01/20 1636     Clinical Impression Statement Patient demonstrated continued good motivation and participation today with all  walking and standing activities. Vitals monitored throughout session. Spo2 97% with HR ranging from 100-120 bpm. He continues to demo fatigue and weakness as limiting factor along with SOB with exertion. ?Patient would benefit from additional skilled PT Intervention to improve strength, balance and mobility.    Personal Factors and Comorbidities Age;Comorbidity 3+;Past/Current Experience;Fitness    Comorbidities OPD, A-fib, Aortic Atherosclerosis, ischemic cardiomyopathy, Benign hypertension with chronic kidney disease, stage III (CMS-HCC); Essential hypertension; Chronic bronchitis, unspecified chronic bronchitis type (CMS-HCC); OSA (obstructive sleep apnea); Morbid obesity with BMI of 40.0-44.9, adult (CMS-HCC)    Examination-Activity Limitations Bend;Carry;Squat;Continence;Stand;Transfers;Locomotion Level;Reach Overhead;Bed Mobility;Lift;Stairs;Toileting;Dressing    Examination-Participation Restrictions Community Activity;Laundry;Yard Work;Cleaning;Driving;Meal Prep;Shop    Stability/Clinical Decision Making Evolving/Moderate complexity    Rehab Potential Fair    PT Frequency 2x / week    PT Duration 12 weeks    PT Treatment/Interventions ADLs/Self Care Home Management;Moist Heat;Cryotherapy;Electrical Stimulation;Functional mobility training;Therapeutic activities;Therapeutic exercise;DME Instruction;Gait training;Balance training;Neuromuscular re-education;Patient/family education;Energy conservation;Biofeedback;Ultrasound;Stair training;Orthotic Fit/Training;Manual techniques;Manual lymph drainage;Dry needling;Passive range of motion;Taping;Vestibular;Visual/perceptual remediation/compensation    PT Next Visit Plan Continue with progressive UE/LE Strengthening for improved strength and endurance    PT Home Exercise Plan No changes    Consulted and Agree with Plan of Care Patient             Patient will benefit from skilled therapeutic intervention in order to improve the following  deficits and impairments:  Abnormal gait, Difficulty walking, Cardiopulmonary status limiting activity, Decreased endurance, Impaired UE functional use, Obesity, Decreased activity tolerance, Decreased balance, Improper body mechanics, Decreased mobility, Decreased strength, Impaired sensation, Postural dysfunction, Increased edema, Impaired flexibility, Decreased range of motion, Decreased coordination, Pain  Visit Diagnosis: Abnormality of gait and mobility  Difficulty in walking, not elsewhere classified  Muscle weakness (generalized)     Problem List Patient Active Problem List   Diagnosis Date Noted   Atrial fibrillation ([place]) 04/09/2019   Essential hypertension 04/09/2019   Venous stasis dermatitis of both lower extremities 04/09/2019   Lower limb ulcer, ankle, right, limited to breakdown of skin ([place]) 04/09/2019   Rhabdomyolysis 12/10/2018   Sepsis ([place]) 07/28/2018   Cough 03/20/2015   Bronchitis, chronic obstructive w acute bronchitis ([place]) 99/83/3825   Umbilical hernia without obstruction and without gangrene 01/12/2015   Ventral hernia without obstruction or gangrene 01/02/2015    Lewis Moccasin, PT 10/01/2020, 5:26 PM  [place] [address] [place], [place], [address] Phone: [phone]   Fax:  [phone]  Name: Laramie Meissner. MRN: [id number] Date of Birth: 08-17-1940

## 2020-10-01 NOTE — Telephone Encounter (Signed)
Patient returning phone call from our office this date (outgoing call from Janna Arch), pt reports he has schedule conflicts that will interfere with his ability to come to therapy at a different time that previously scheduled. Pt elects to keep he current PT appointment time and confirms plan to be here today at 4:15 on [place] schedule.   9:09 AM, 10/01/20 Etta Grandchild, PT, DPT Physical Therapist - [place] [phone]

## 2020-10-06 ENCOUNTER — Other Ambulatory Visit: Payer: Self-pay

## 2020-10-06 ENCOUNTER — Ambulatory Visit: Payer: HMO

## 2020-10-06 DIAGNOSIS — M6281 Muscle weakness (generalized): Secondary | ICD-10-CM

## 2020-10-06 DIAGNOSIS — R262 Difficulty in walking, not elsewhere classified: Secondary | ICD-10-CM

## 2020-10-06 DIAGNOSIS — R2681 Unsteadiness on feet: Secondary | ICD-10-CM

## 2020-10-06 DIAGNOSIS — R269 Unspecified abnormalities of gait and mobility: Secondary | ICD-10-CM

## 2020-10-06 NOTE — Therapy (Signed)
[place] MAIN [facility] SERVICES [address] [place], [place], [address] Phone: [phone]   Fax:  [phone]  Physical Therapy Treatment/RECERT  Patient Details  Name: Daniel Eaton. MRN: [id number] Date of Birth: 1940/12/03 Referring Provider (PT): Dr. [name] MD   Encounter Date: 10/06/2020   PT End of Session - 10/06/20 1657     Visit Number 17    Number of Visits 41    Date for PT Re-Evaluation 12/29/20    Authorization Type 7/10 08/26/20    PT Start Time 1521    PT Stop Time 1600    PT Time Calculation (min) 39 min    Equipment Utilized During Treatment Gait belt    Activity Tolerance Patient limited by fatigue;No increased pain    Behavior During Therapy WFL for tasks assessed/performed             Past Medical History:  Diagnosis Date   A-fib ([place])    Cellulitis    CHF (congestive heart failure) (HCC)    COPD (chronic obstructive pulmonary disease) ([place])    Difficult intubation    Hyperlipidemia    Hypertension    Hypothyroidism    Kidney disease    Sleep apnea    Stroke [facility])    Thyroid disease     Past Surgical History:  Procedure Laterality Date   CARDIAC CATHETERIZATION     CATARACT EXTRACTION W/ INTRAOCULAR LENS  IMPLANT, BILATERAL     COLONOSCOPY  2008   HERNIA REPAIR     bilateral inguinal hernia/ [facility]   HERNIA REPAIR  02/02/2015   18 x 28 cm ventral light mesh placed laparoscopically.   TONSILLECTOMY     UMBILICAL HERNIA REPAIR N/A 02/02/2015   Procedure: HERNIA REPAIR UMBILICAL ADULT;  Surgeon: Robert Bellow, MD;  Location: [facility] ORS;  Service: General;  Laterality: N/A;   VENTRAL HERNIA REPAIR N/A 02/02/2015   Procedure: LAPAROSCOPIC VENTRAL HERNIA;  Surgeon: Robert Bellow, MD;  Location: [facility] ORS;  Service: General;  Laterality: N/A;   vp shunt placement  1979    There were no vitals filed for this visit.       [facility] PT Assessment - 10/06/20 0001        Standardized Balance Assessment   Standardized Balance Assessment Berg Balance Test      Berg Balance Test   Sit to Stand Able to stand  independently using hands    Standing Unsupported Able to stand 2 minutes with supervision    Sitting with Back Unsupported but Feet Supported on Floor or Stool Able to sit safely and securely 2 minutes    Stand to Sit Uses backs of legs against chair to control descent    Transfers Able to transfer safely, definite need of hands    Standing Unsupported with Eyes Closed Unable to keep eyes closed 3 seconds but stays steady    Standing Unsupported with Feet Together Needs help to attain position but able to stand for 30 seconds with feet together    From Standing, Reach Forward with Outstretched Arm Can reach forward >5 cm safely (2")    From Standing Position, Pick up Object from Floor Unable to pick up and needs supervision    From Standing Position, Turn to Look Behind Over each Shoulder Turn sideways only but maintains balance    Turn 360 Degrees Needs assistance while turning    Standing Unsupported, Alternately Place Feet on Step/Stool Able to complete >  2 steps/needs minimal assist    Standing Unsupported, One Foot in [employer] balance while stepping or standing    Standing on One Leg Unable to try or needs assist to prevent fall    Total Score 23            RECERT  Goals: FOTO: 28%  BLE strength : grossly 4/5 bilaterally with hip extension 3+/5  10 MWT: first attempt 17.2 seconds second attend 16.59 second attempt =0.6 m/s   Add goals:  BERG: 23/56  Walking distance: 2 minute walk test: 88 ft with frequent standing breaks.    Pt educated throughout session about proper posture and technique with exercises. Improved exercise technique, movement at target joints, use of target muscles after min to mod verbal, visual, tactile cues.     Patient met his 5x STS and standing tolerance goals during his last progress note. His 10 MWT was  found to be the same velocity as last month however this is to be expected as he has been recovering from cellulitis. New goals addressing balance (BERG) and capacity of ambulation added with patient tolerating goal performance. Patient is challenged with dynamic stabilization without UE support which will continue to be an area of improvement. Patient remains highly motivated for progression of care and independence indicating a good prognosis. Patient would benefit from additional skilled PT Intervention to improve strength, balance and mobility                 PT Education - 10/06/20 1656     Education provided Yes    Education Details exercise technique, body mechanics    Person(s) Educated Patient    Methods Explanation;Demonstration;Tactile cues;Verbal cues    Comprehension Verbalized understanding;Returned demonstration;Verbal cues required;Tactile cues required              PT Short Term Goals - 08/26/20 1154       PT SHORT TERM GOAL #1   Title Pt will be compliant with given HEP to improve LE muscle strength.    Baseline 6/1: HEP added to 7/13: HEP compliant    Time 6    Period Weeks    Status Achieved    Target Date 08/26/20      PT SHORT TERM GOAL #2   Title Patient will stand from standard height chair on first attempt to allow for improved mobility.     Baseline 6/1: unable to perform 7/13: MET    Time 6    Period Weeks    Status Achieved    Target Date 08/26/20               PT Long Term Goals - 10/06/20 1532       PT LONG TERM GOAL #1   Title Pt will improve FOTO goal to 48 to display perceived improvements in functional mobility.    Baseline 6/1: 40% 7/13: 45% 8/23: 40%    Time 12    Period Weeks    Status Partially Met    Target Date 12/29/20      PT LONG TERM GOAL #2   Title Patient (> 73 years old) will complete five times sit to stand test in < 1 minute indicating an increased LE strength and improved balance.    Baseline 6/1: 1  min 31 seconds with heavy BUE support 7/13: 49 seconds with heavy BUE support    Time 12    Period Weeks    Status Achieved  PT LONG TERM GOAL #3   Title Patient will increase BLE gross strength to 4+/5 as to improve functional strength for independent gait, increased standing tolerance and increased ADL ability.    Baseline 6/1: see note 7/13: see note 8/23: grossly 4/5 bilaterally with hip extension 3+/5    Time 12    Period Weeks    Status Partially Met    Target Date 12/29/20      PT LONG TERM GOAL #4   Title Patient will increase 10 meter walk test to >1.0 m/s as to improve gait speed for better community ambulation and to reduce fall risk.    Baseline 6/1: 0.41 m/s with RW 7/13: 0.6 m/s with walker 8/23: 0.6 m/s with walker    Time 12    Period Weeks    Status On-going    Target Date 12/29/20      PT LONG TERM GOAL #5   Title Patient will stand without UE support >3 minutes for ADL performance and increased stability with independent movement.    Baseline 6/1: 1 min 7 seconds 7/13: 3 min 3 seconds    Time 12    Period Weeks    Status Achieved      Additional Long Term Goals   Additional Long Term Goals Yes      PT LONG TERM GOAL #6   Title Patient will increase Berg Balance score by > 6 points (29/56 )  to demonstrate decreased fall risk during functional activities.    Baseline 8/23: 23/56    Time 12    Period Weeks    Status New    Target Date 12/29/20      PT LONG TERM GOAL #7   Title Patient will ambulate >300 ft during 2 minute walk test with RW for improved capacity for functional mobility.    Baseline 8/23: 88 ft with frequent standing breaks    Time 12    Period Weeks    Status New    Target Date 12/29/20                   Plan - 10/06/20 1658     Clinical Impression Statement Patient met his 5x STS and standing tolerance goals during his last progress note. His 10 MWT was found to be the same velocity as last month however this is to be  expected as he has been recovering from cellulitis. New goals addressing balance (BERG) and capacity of ambulation added with patient tolerating goal performance. Patient is challenged with dynamic stabilization without UE support which will continue to be an area of improvement. Patient remains highly motivated for progression of care and independence indicating a good prognosis. Patient would benefit from additional skilled PT Intervention to improve strength, balance and mobility    Personal Factors and Comorbidities Age;Comorbidity 3+;Past/Current Experience;Fitness    Comorbidities OPD, A-fib, Aortic Atherosclerosis, ischemic cardiomyopathy, Benign hypertension with chronic kidney disease, stage III (CMS-HCC); Essential hypertension; Chronic bronchitis, unspecified chronic bronchitis type (CMS-HCC); OSA (obstructive sleep apnea); Morbid obesity with BMI of 40.0-44.9, adult (CMS-HCC)    Examination-Activity Limitations Bend;Carry;Squat;Continence;Stand;Transfers;Locomotion Level;Reach Overhead;Bed Mobility;Lift;Stairs;Toileting;Dressing    Examination-Participation Restrictions Community Activity;Laundry;Yard Work;Cleaning;Driving;Meal Prep;Shop    Stability/Clinical Decision Making Evolving/Moderate complexity    Rehab Potential Fair    PT Frequency 2x / week    PT Duration 12 weeks    PT Treatment/Interventions ADLs/Self Care Home Management;Moist Heat;Cryotherapy;Electrical Stimulation;Functional mobility training;Therapeutic activities;Therapeutic exercise;DME Instruction;Gait training;Balance training;Neuromuscular re-education;Patient/family education;Energy conservation;Biofeedback;Ultrasound;Stair training;Orthotic Fit/Training;Manual techniques;Manual lymph  drainage;Dry needling;Passive range of motion;Taping;Vestibular;Visual/perceptual remediation/compensation    PT Next Visit Plan Continue with progressive UE/LE Strengthening for improved strength and endurance    PT Home Exercise Plan No  changes    Consulted and Agree with Plan of Care Patient             Patient will benefit from skilled therapeutic intervention in order to improve the following deficits and impairments:  Abnormal gait, Difficulty walking, Cardiopulmonary status limiting activity, Decreased endurance, Impaired UE functional use, Obesity, Decreased activity tolerance, Decreased balance, Improper body mechanics, Decreased mobility, Decreased strength, Impaired sensation, Postural dysfunction, Increased edema, Impaired flexibility, Decreased range of motion, Decreased coordination, Pain  Visit Diagnosis: Abnormality of gait and mobility  Difficulty in walking, not elsewhere classified  Muscle weakness (generalized)  Unsteadiness on feet     Problem List Patient Active Problem List   Diagnosis Date Noted   Atrial fibrillation ([place]) 04/09/2019   Essential hypertension 04/09/2019   Venous stasis dermatitis of both lower extremities 04/09/2019   Lower limb ulcer, ankle, right, limited to breakdown of skin ([place]) 04/09/2019   Rhabdomyolysis 12/10/2018   Sepsis ([place]) 07/28/2018   Cough 03/20/2015   Bronchitis, chronic obstructive w acute bronchitis ([place]) 79/43/2761   Umbilical hernia without obstruction and without gangrene 01/12/2015   Ventral hernia without obstruction or gangrene 01/02/2015    Janna Arch, PT, DPT  10/06/2020, 5:00 PM  [place] MAIN [facility] SERVICES [address] [place], [place], [address] Phone: [phone]   Fax:  [phone]  Name: Daniel Eaton. MRN: [id number] Date of Birth: [birth year]

## 2020-10-13 ENCOUNTER — Ambulatory Visit: Payer: HMO

## 2020-10-14 DIAGNOSIS — L03031 Cellulitis of right toe: Secondary | ICD-10-CM | POA: Diagnosis not present

## 2020-10-14 DIAGNOSIS — L600 Ingrowing nail: Secondary | ICD-10-CM | POA: Diagnosis not present

## 2020-10-14 DIAGNOSIS — E1142 Type 2 diabetes mellitus with diabetic polyneuropathy: Secondary | ICD-10-CM | POA: Diagnosis not present

## 2020-10-16 DIAGNOSIS — I129 Hypertensive chronic kidney disease with stage 1 through stage 4 chronic kidney disease, or unspecified chronic kidney disease: Secondary | ICD-10-CM | POA: Diagnosis not present

## 2020-10-16 DIAGNOSIS — E039 Hypothyroidism, unspecified: Secondary | ICD-10-CM | POA: Diagnosis not present

## 2020-10-16 DIAGNOSIS — I255 Ischemic cardiomyopathy: Secondary | ICD-10-CM | POA: Diagnosis not present

## 2020-10-16 DIAGNOSIS — N183 Chronic kidney disease, stage 3 unspecified: Secondary | ICD-10-CM | POA: Diagnosis not present

## 2020-10-16 DIAGNOSIS — R7303 Prediabetes: Secondary | ICD-10-CM | POA: Diagnosis not present

## 2020-10-21 ENCOUNTER — Ambulatory Visit: Payer: HMO | Attending: Internal Medicine

## 2020-10-21 ENCOUNTER — Other Ambulatory Visit: Payer: Self-pay

## 2020-10-21 DIAGNOSIS — R262 Difficulty in walking, not elsewhere classified: Secondary | ICD-10-CM | POA: Diagnosis not present

## 2020-10-21 DIAGNOSIS — R2681 Unsteadiness on feet: Secondary | ICD-10-CM | POA: Insufficient documentation

## 2020-10-21 DIAGNOSIS — R269 Unspecified abnormalities of gait and mobility: Secondary | ICD-10-CM | POA: Diagnosis not present

## 2020-10-21 DIAGNOSIS — M6281 Muscle weakness (generalized): Secondary | ICD-10-CM | POA: Diagnosis not present

## 2020-10-21 DIAGNOSIS — R2689 Other abnormalities of gait and mobility: Secondary | ICD-10-CM | POA: Diagnosis not present

## 2020-10-21 NOTE — Therapy (Signed)
[place] MAIN [facility] SERVICES [address] [place], [place], [address] Phone: [phone]   Fax:  [phone]  Physical Therapy Treatment  Patient Details  Name: Daniel Eaton. MRN: [id number] Date of Birth: 05-Aug-1940 Referring Provider (PT): Dr. [name] MD   Encounter Date: 10/21/2020   PT End of Session - 10/21/20 1715     Visit Number 18    Number of Visits 41    Date for PT Re-Evaluation 12/29/20    Authorization Type 8/10 08/26/20    PT Start Time 5436    PT Stop Time 1730    PT Time Calculation (min) 36 min    Equipment Utilized During Treatment Gait belt    Activity Tolerance Patient limited by fatigue;No increased pain    Behavior During Therapy WFL for tasks assessed/performed             Past Medical History:  Diagnosis Date   A-fib ([place])    Cellulitis    CHF (congestive heart failure) (HCC)    COPD (chronic obstructive pulmonary disease) ([place])    Difficult intubation    Hyperlipidemia    Hypertension    Hypothyroidism    Kidney disease    Sleep apnea    Stroke [facility])    Thyroid disease     Past Surgical History:  Procedure Laterality Date   CARDIAC CATHETERIZATION     CATARACT EXTRACTION W/ INTRAOCULAR LENS  IMPLANT, BILATERAL     COLONOSCOPY  2008   HERNIA REPAIR     bilateral inguinal hernia/ [facility]   HERNIA REPAIR  02/02/2015   18 x 28 cm ventral light mesh placed laparoscopically.   TONSILLECTOMY     UMBILICAL HERNIA REPAIR N/A 02/02/2015   Procedure: HERNIA REPAIR UMBILICAL ADULT;  Surgeon: Robert Bellow, MD;  Location: [facility] ORS;  Service: General;  Laterality: N/A;   VENTRAL HERNIA REPAIR N/A 02/02/2015   Procedure: LAPAROSCOPIC VENTRAL HERNIA;  Surgeon: Robert Bellow, MD;  Location: [facility] ORS;  Service: General;  Laterality: N/A;   vp shunt placement  1979    There were no vitals filed for this visit.   Subjective Assessment - 10/21/20 1708     Subjective Patient  returning to PT after ~ 2 weeks absence from big toe infection and GI issues from medication. Arrived late to PT session.    Pertinent History Pt is a 80 y.o. male referred to PT for Physical deconditioning. Pt is known to this clinic and has received rehab here in 2019. Pt has a significant PMH of COPD, A-fib, Aortic Atherosclerosis, ischemic cardiomyopathy, benign hypertension with chronic kidney disease, stage III (CMS-HCC); Essential hypertension; Chronic bronchitis, unspecified chronic bronchitis type (CMS-HCC); OSA (obstructive sleep apnea); Morbid obesity with BMI of 40.0-44.9, adult (CMS-HCC) per last cardiology visit. Pt reports being unable to being able to participate in PT during his stay at [facility] this previous October and also tested positive for COVID-19.  Pt's goal is to focus on gait, balance, LE strength, and walking endurance with RW. Was last seen by this therapist on 05/27/20.    Limitations Standing;House hold activities;Walking;Lifting    How long can you sit comfortably? hour    How long can you stand comfortably? not sure    How long can you walk comfortably? walk with RW to bathroom and back    Patient Stated Goals Improve gait, balance, and LE strength. Reduce risk of falls.    Currently in Pain? No/denies  TREATMENT:   Standing in parallel bars: -hip flexion march x10 reps into GTB across // bars  bilaterally, cues to slow down LE movement and increase ROM for better strengthening -Heel raises x10 reps with cues to reduce UE weight bearing to challenge stance control, CGA for safety;    Forward/backward step over 1/2 bolster with BUE rail assist x10 reps each;  Required min VCs to increase heel strike when stepping forward, CGA for safety; Side step over 1/2 bolster with 2 HHA on rail x10 reps each direction with min VCs to increase step length for better foot clearance;     5x STS hands on // bars in full stand for stabilization    Patient motivated and participated well within session. Vitals monitored throughout session. Patient tolerated session well.  Denies any increase in pain;      Pt educated throughout session about proper posture and technique with exercises. Improved exercise technique, movement at target joints, use of target muscles after min to mod verbal, visual, tactile cues.   Patient session limited in duration due to late arrival. Patient remains very motivated throughout remaining session time and is able to demonstrate functional mobility with obstacle negotiation over bolster. Patient is returning from ~2 weeks absence due to big toe infection and GI issues from medication. Patient would benefit from additional skilled PT Intervention to improve strength, balance and mobility           Upper Extremity Functional Index Score :   /80   PT Education - 10/21/20 1714     Education provided Yes    Education Details exercise technique, body mechanics    Person(s) Educated Patient    Methods Explanation;Demonstration;Tactile cues;Verbal cues    Comprehension Verbalized understanding;Returned demonstration;Verbal cues required;Tactile cues required              PT Short Term Goals - 08/26/20 1154       PT SHORT TERM GOAL #1   Title Pt will be compliant with given HEP to improve LE muscle strength.    Baseline 6/1: HEP added to 7/13: HEP compliant    Time 6    Period Weeks    Status Achieved    Target Date 08/26/20      PT SHORT TERM GOAL #2   Title Patient will stand from standard height chair on first attempt to allow for improved mobility.     Baseline 6/1: unable to perform 7/13: MET    Time 6    Period Weeks    Status Achieved    Target Date 08/26/20               PT Long Term Goals - 10/06/20 1532       PT LONG TERM GOAL #1   Title Pt will improve FOTO goal to 48 to display perceived improvements in functional mobility.    Baseline 6/1: 40% 7/13: 45% 8/23:  40%    Time 12    Period Weeks    Status Partially Met    Target Date 12/29/20      PT LONG TERM GOAL #2   Title Patient (> 82 years old) will complete five times sit to stand test in < 1 minute indicating an increased LE strength and improved balance.    Baseline 6/1: 1 min 31 seconds with heavy BUE support 7/13: 49 seconds with heavy BUE support    Time 12    Period Weeks    Status Achieved  PT LONG TERM GOAL #3   Title Patient will increase BLE gross strength to 4+/5 as to improve functional strength for independent gait, increased standing tolerance and increased ADL ability.    Baseline 6/1: see note 7/13: see note 8/23: grossly 4/5 bilaterally with hip extension 3+/5    Time 12    Period Weeks    Status Partially Met    Target Date 12/29/20      PT LONG TERM GOAL #4   Title Patient will increase 10 meter walk test to >1.0 m/s as to improve gait speed for better community ambulation and to reduce fall risk.    Baseline 6/1: 0.41 m/s with RW 7/13: 0.6 m/s with walker 8/23: 0.6 m/s with walker    Time 12    Period Weeks    Status On-going    Target Date 12/29/20      PT LONG TERM GOAL #5   Title Patient will stand without UE support >3 minutes for ADL performance and increased stability with independent movement.    Baseline 6/1: 1 min 7 seconds 7/13: 3 min 3 seconds    Time 12    Period Weeks    Status Achieved      Additional Long Term Goals   Additional Long Term Goals Yes      PT LONG TERM GOAL #6   Title Patient will increase Berg Balance score by > 6 points (29/56 )  to demonstrate decreased fall risk during functional activities.    Baseline 8/23: 23/56    Time 12    Period Weeks    Status New    Target Date 12/29/20      PT LONG TERM GOAL #7   Title Patient will ambulate >300 ft during 2 minute walk test with RW for improved capacity for functional mobility.    Baseline 8/23: 88 ft with frequent standing breaks    Time 12    Period Weeks    Status  New    Target Date 12/29/20                   Plan - 10/22/20 3875     Clinical Impression Statement Patient session limited in duration due to late arrival. Patient remains very motivated throughout remaining session time and is able to demonstrate functional mobility with obstacle negotiation over bolster. Patient is returning from ~2 weeks absence due to big toe infection and GI issues from medication. Patient would benefit from additional skilled PT Intervention to improve strength, balance and mobility    Personal Factors and Comorbidities Age;Comorbidity 3+;Past/Current Experience;Fitness    Comorbidities OPD, A-fib, Aortic Atherosclerosis, ischemic cardiomyopathy, Benign hypertension with chronic kidney disease, stage III (CMS-HCC); Essential hypertension; Chronic bronchitis, unspecified chronic bronchitis type (CMS-HCC); OSA (obstructive sleep apnea); Morbid obesity with BMI of 40.0-44.9, adult (CMS-HCC)    Examination-Activity Limitations Bend;Carry;Squat;Continence;Stand;Transfers;Locomotion Level;Reach Overhead;Bed Mobility;Lift;Stairs;Toileting;Dressing    Examination-Participation Restrictions Community Activity;Laundry;Yard Work;Cleaning;Driving;Meal Prep;Shop    Stability/Clinical Decision Making Evolving/Moderate complexity    Rehab Potential Fair    PT Frequency 2x / week    PT Duration 12 weeks    PT Treatment/Interventions ADLs/Self Care Home Management;Moist Heat;Cryotherapy;Electrical Stimulation;Functional mobility training;Therapeutic activities;Therapeutic exercise;DME Instruction;Gait training;Balance training;Neuromuscular re-education;Patient/family education;Energy conservation;Biofeedback;Ultrasound;Stair training;Orthotic Fit/Training;Manual techniques;Manual lymph drainage;Dry needling;Passive range of motion;Taping;Vestibular;Visual/perceptual remediation/compensation    PT Next Visit Plan Continue with progressive UE/LE Strengthening for improved strength  and endurance    PT Home Exercise Plan No changes    Consulted and Agree with Plan  of Care Patient             Patient will benefit from skilled therapeutic intervention in order to improve the following deficits and impairments:  Abnormal gait, Difficulty walking, Cardiopulmonary status limiting activity, Decreased endurance, Impaired UE functional use, Obesity, Decreased activity tolerance, Decreased balance, Improper body mechanics, Decreased mobility, Decreased strength, Impaired sensation, Postural dysfunction, Increased edema, Impaired flexibility, Decreased range of motion, Decreased coordination, Pain  Visit Diagnosis: Abnormality of gait and mobility  Difficulty in walking, not elsewhere classified  Muscle weakness (generalized)  Unsteadiness on feet     Problem List Patient Active Problem List   Diagnosis Date Noted   Atrial fibrillation ([place]) 04/09/2019   Essential hypertension 04/09/2019   Venous stasis dermatitis of both lower extremities 04/09/2019   Lower limb ulcer, ankle, right, limited to breakdown of skin ([place]) 04/09/2019   Rhabdomyolysis 12/10/2018   Sepsis ([place]) 07/28/2018   Cough 03/20/2015   Bronchitis, chronic obstructive w acute bronchitis ([place]) 18/48/5927   Umbilical hernia without obstruction and without gangrene 01/12/2015   Ventral hernia without obstruction or gangrene 01/02/2015    Janna Arch, PT, DPT  10/22/2020, 8:55 AM  [place] [address] [place], [place], [address] Phone: [phone]   Fax:  [phone]  Name: Jamille Yoshino. MRN: [id number] Date of Birth: 23-Feb-1940

## 2020-10-23 DIAGNOSIS — R7303 Prediabetes: Secondary | ICD-10-CM | POA: Diagnosis not present

## 2020-10-23 DIAGNOSIS — Z23 Encounter for immunization: Secondary | ICD-10-CM | POA: Diagnosis not present

## 2020-10-23 DIAGNOSIS — E039 Hypothyroidism, unspecified: Secondary | ICD-10-CM | POA: Diagnosis not present

## 2020-10-23 DIAGNOSIS — M791 Myalgia, unspecified site: Secondary | ICD-10-CM | POA: Diagnosis not present

## 2020-10-23 DIAGNOSIS — I700 Atherosclerosis of aorta: Secondary | ICD-10-CM | POA: Diagnosis not present

## 2020-10-23 DIAGNOSIS — N183 Chronic kidney disease, stage 3 unspecified: Secondary | ICD-10-CM | POA: Diagnosis not present

## 2020-10-23 DIAGNOSIS — I129 Hypertensive chronic kidney disease with stage 1 through stage 4 chronic kidney disease, or unspecified chronic kidney disease: Secondary | ICD-10-CM | POA: Diagnosis not present

## 2020-10-23 DIAGNOSIS — Z0000 Encounter for general adult medical examination without abnormal findings: Secondary | ICD-10-CM | POA: Diagnosis not present

## 2020-10-23 DIAGNOSIS — I255 Ischemic cardiomyopathy: Secondary | ICD-10-CM | POA: Diagnosis not present

## 2020-10-23 DIAGNOSIS — J42 Unspecified chronic bronchitis: Secondary | ICD-10-CM | POA: Diagnosis not present

## 2020-10-23 DIAGNOSIS — I4819 Other persistent atrial fibrillation: Secondary | ICD-10-CM | POA: Diagnosis not present

## 2020-10-23 DIAGNOSIS — R5381 Other malaise: Secondary | ICD-10-CM | POA: Diagnosis not present

## 2020-10-26 ENCOUNTER — Other Ambulatory Visit: Payer: Self-pay

## 2020-10-26 ENCOUNTER — Ambulatory Visit: Payer: HMO

## 2020-10-26 DIAGNOSIS — R269 Unspecified abnormalities of gait and mobility: Secondary | ICD-10-CM | POA: Diagnosis not present

## 2020-10-26 DIAGNOSIS — M6281 Muscle weakness (generalized): Secondary | ICD-10-CM

## 2020-10-26 DIAGNOSIS — R262 Difficulty in walking, not elsewhere classified: Secondary | ICD-10-CM

## 2020-10-26 NOTE — Therapy (Signed)
[place] MAIN [facility] SERVICES [address] [place], [place], [address] Phone: [phone]   Fax:  [phone]  Physical Therapy Treatment  Patient Details  Name: Daniel Eaton. MRN: [id number] Date of Birth: 1940/10/03 Referring Provider (PT): Dr. [name] MD   Encounter Date: 10/26/2020   PT End of Session - 10/26/20 1656     Visit Number 19    Number of Visits 41    Date for PT Re-Evaluation 12/29/20    Authorization Type 9/10 08/26/20    PT Start Time 1647    PT Stop Time 1728    PT Time Calculation (min) 41 min    Equipment Utilized During Treatment Gait belt    Activity Tolerance Patient limited by fatigue;No increased pain    Behavior During Therapy WFL for tasks assessed/performed             Past Medical History:  Diagnosis Date   A-fib ([place])    Cellulitis    CHF (congestive heart failure) (HCC)    COPD (chronic obstructive pulmonary disease) ([place])    Difficult intubation    Hyperlipidemia    Hypertension    Hypothyroidism    Kidney disease    Sleep apnea    Stroke [facility])    Thyroid disease     Past Surgical History:  Procedure Laterality Date   CARDIAC CATHETERIZATION     CATARACT EXTRACTION W/ INTRAOCULAR LENS  IMPLANT, BILATERAL     COLONOSCOPY  2008   HERNIA REPAIR     bilateral inguinal hernia/ [facility]   HERNIA REPAIR  02/02/2015   18 x 28 cm ventral light mesh placed laparoscopically.   TONSILLECTOMY     UMBILICAL HERNIA REPAIR N/A 02/02/2015   Procedure: HERNIA REPAIR UMBILICAL ADULT;  Surgeon: Robert Bellow, MD;  Location: [facility] ORS;  Service: General;  Laterality: N/A;   VENTRAL HERNIA REPAIR N/A 02/02/2015   Procedure: LAPAROSCOPIC VENTRAL HERNIA;  Surgeon: Robert Bellow, MD;  Location: [facility] ORS;  Service: General;  Laterality: N/A;   vp shunt placement  1979    There were no vitals filed for this visit.   Subjective Assessment - 10/26/20 1652     Subjective Patient went  to [place] after last session. Has lost weight and has not had any adjustments to medications.    Pertinent History Pt is a 80 y.o. male referred to PT for Physical deconditioning. Pt is known to this clinic and has received rehab here in 2019. Pt has a significant PMH of COPD, A-fib, Aortic Atherosclerosis, ischemic cardiomyopathy, benign hypertension with chronic kidney disease, stage III (CMS-HCC); Essential hypertension; Chronic bronchitis, unspecified chronic bronchitis type (CMS-HCC); OSA (obstructive sleep apnea); Morbid obesity with BMI of 40.0-44.9, adult (CMS-HCC) per last cardiology visit. Pt reports being unable to being able to participate in PT during his stay at [facility] this previous October and also tested positive for COVID-19.  Pt's goal is to focus on gait, balance, LE strength, and walking endurance with RW. Was last seen by this therapist on 05/27/20.    Limitations Standing;House hold activities;Walking;Lifting    How long can you sit comfortably? hour    How long can you stand comfortably? not sure    How long can you walk comfortably? walk with RW to bathroom and back    Patient Stated Goals Improve gait, balance, and LE strength. Reduce risk of falls.    Currently in Pain? No/denies  TREATMENT:     Standing in parallel bars: -hip flexion march x10 reps into GTB across // bars  bilaterally, cues to slow down LE movement and increase ROM for better strengthening -Heel raises x10 reps with cues to reduce UE weight bearing to challenge stance control, CGA for safety;  -lateral stepping 4x length of // bars. Close CGA -forward/backwards walking 8x length of // bars, cues for increased step length Three hedgehog taps: forward lateral, backwards 8x each LE, BUE support     Ambulate 60 ft with turn with RW and close CGA, one standing rest break due to shortness of breath.   Seated: Seated scapular retractions intermittently when seated  between standing interventions for increased breathing.    Patient motivated and participated well within session. Vitals monitored throughout session. Patient tolerated session well.  Denies any increase in pain;       Small break in session for toileting required.    Pt educated throughout session about proper posture and technique with exercises. Improved exercise technique, movement at target joints, use of target muscles after min to mod verbal, visual, tactile cues.  Patient is highly motivated throughout physical therapy session. He is able to perform increased standing interventions in // bars this session. He demonstrates excellent forwards and backwards ambulation in // bars. Small break in session required for toileting break. Patient would benefit from additional skilled PT Intervention to improve strength, balance and mobility                  PT Education - 10/26/20 1653     Education provided Yes    Education Details exercise technique, body mechanics    Person(s) Educated Patient    Methods Explanation;Demonstration;Tactile cues;Verbal cues    Comprehension Verbalized understanding;Returned demonstration;Verbal cues required;Tactile cues required              PT Short Term Goals - 08/26/20 1154       PT SHORT TERM GOAL #1   Title Pt will be compliant with given HEP to improve LE muscle strength.    Baseline 6/1: HEP added to 7/13: HEP compliant    Time 6    Period Weeks    Status Achieved    Target Date 08/26/20      PT SHORT TERM GOAL #2   Title Patient will stand from standard height chair on first attempt to allow for improved mobility.     Baseline 6/1: unable to perform 7/13: MET    Time 6    Period Weeks    Status Achieved    Target Date 08/26/20               PT Long Term Goals - 10/06/20 1532       PT LONG TERM GOAL #1   Title Pt will improve FOTO goal to 48 to display perceived improvements in functional mobility.     Baseline 6/1: 40% 7/13: 45% 8/23: 40%    Time 12    Period Weeks    Status Partially Met    Target Date 12/29/20      PT LONG TERM GOAL #2   Title Patient (> 66 years old) will complete five times sit to stand test in < 1 minute indicating an increased LE strength and improved balance.    Baseline 6/1: 1 min 31 seconds with heavy BUE support 7/13: 49 seconds with heavy BUE support    Time 12    Period Weeks  Status Achieved      PT LONG TERM GOAL #3   Title Patient will increase BLE gross strength to 4+/5 as to improve functional strength for independent gait, increased standing tolerance and increased ADL ability.    Baseline 6/1: see note 7/13: see note 8/23: grossly 4/5 bilaterally with hip extension 3+/5    Time 12    Period Weeks    Status Partially Met    Target Date 12/29/20      PT LONG TERM GOAL #4   Title Patient will increase 10 meter walk test to >1.0 m/s as to improve gait speed for better community ambulation and to reduce fall risk.    Baseline 6/1: 0.41 m/s with RW 7/13: 0.6 m/s with walker 8/23: 0.6 m/s with walker    Time 12    Period Weeks    Status On-going    Target Date 12/29/20      PT LONG TERM GOAL #5   Title Patient will stand without UE support >3 minutes for ADL performance and increased stability with independent movement.    Baseline 6/1: 1 min 7 seconds 7/13: 3 min 3 seconds    Time 12    Period Weeks    Status Achieved      Additional Long Term Goals   Additional Long Term Goals Yes      PT LONG TERM GOAL #6   Title Patient will increase Berg Balance score by > 6 points (29/56 )  to demonstrate decreased fall risk during functional activities.    Baseline 8/23: 23/56    Time 12    Period Weeks    Status New    Target Date 12/29/20      PT LONG TERM GOAL #7   Title Patient will ambulate >300 ft during 2 minute walk test with RW for improved capacity for functional mobility.    Baseline 8/23: 88 ft with frequent standing breaks    Time  12    Period Weeks    Status New    Target Date 12/29/20                   Plan - 10/26/20 1723     Clinical Impression Statement Patient is highly motivated throughout physical therapy session. He is able to perform increased standing interventions in // bars this session. He demonstrates excellent forwards and backwards ambulation in // bars. Small break in session required for toileting break. Patient would benefit from additional skilled PT Intervention to improve strength, balance and mobility    Personal Factors and Comorbidities Age;Comorbidity 3+;Past/Current Experience;Fitness    Comorbidities OPD, A-fib, Aortic Atherosclerosis, ischemic cardiomyopathy, Benign hypertension with chronic kidney disease, stage III (CMS-HCC); Essential hypertension; Chronic bronchitis, unspecified chronic bronchitis type (CMS-HCC); OSA (obstructive sleep apnea); Morbid obesity with BMI of 40.0-44.9, adult (CMS-HCC)    Examination-Activity Limitations Bend;Carry;Squat;Continence;Stand;Transfers;Locomotion Level;Reach Overhead;Bed Mobility;Lift;Stairs;Toileting;Dressing    Examination-Participation Restrictions Community Activity;Laundry;Yard Work;Cleaning;Driving;Meal Prep;Shop    Stability/Clinical Decision Making Evolving/Moderate complexity    Rehab Potential Fair    PT Frequency 2x / week    PT Duration 12 weeks    PT Treatment/Interventions ADLs/Self Care Home Management;Moist Heat;Cryotherapy;Electrical Stimulation;Functional mobility training;Therapeutic activities;Therapeutic exercise;DME Instruction;Gait training;Balance training;Neuromuscular re-education;Patient/family education;Energy conservation;Biofeedback;Ultrasound;Stair training;Orthotic Fit/Training;Manual techniques;Manual lymph drainage;Dry needling;Passive range of motion;Taping;Vestibular;Visual/perceptual remediation/compensation    PT Next Visit Plan progress note    PT Home Exercise Plan No changes    Consulted and Agree  with Plan of Care Patient  Patient will benefit from skilled therapeutic intervention in order to improve the following deficits and impairments:  Abnormal gait, Difficulty walking, Cardiopulmonary status limiting activity, Decreased endurance, Impaired UE functional use, Obesity, Decreased activity tolerance, Decreased balance, Improper body mechanics, Decreased mobility, Decreased strength, Impaired sensation, Postural dysfunction, Increased edema, Impaired flexibility, Decreased range of motion, Decreased coordination, Pain  Visit Diagnosis: Abnormality of gait and mobility  Difficulty in walking, not elsewhere classified  Muscle weakness (generalized)     Problem List Patient Active Problem List   Diagnosis Date Noted   Atrial fibrillation ([place]) 04/09/2019   Essential hypertension 04/09/2019   Venous stasis dermatitis of both lower extremities 04/09/2019   Lower limb ulcer, ankle, right, limited to breakdown of skin ([place]) 04/09/2019   Rhabdomyolysis 12/10/2018   Sepsis ([place]) 07/28/2018   Cough 03/20/2015   Bronchitis, chronic obstructive w acute bronchitis ([place]) 01/75/1025   Umbilical hernia without obstruction and without gangrene 01/12/2015   Ventral hernia without obstruction or gangrene 01/02/2015    Janna Arch, PT, DPT  10/26/2020, 5:29 PM  [place] [address] [place], [place], [address] Phone: [phone]   Fax:  [phone]  Name: Daniel Eaton. MRN: [id number] Date of Birth: May 14, 1940

## 2020-10-28 ENCOUNTER — Other Ambulatory Visit: Payer: Self-pay

## 2020-10-28 ENCOUNTER — Ambulatory Visit: Payer: HMO | Admitting: Physical Therapy

## 2020-10-28 DIAGNOSIS — R269 Unspecified abnormalities of gait and mobility: Secondary | ICD-10-CM

## 2020-10-28 DIAGNOSIS — M6281 Muscle weakness (generalized): Secondary | ICD-10-CM

## 2020-10-28 DIAGNOSIS — R262 Difficulty in walking, not elsewhere classified: Secondary | ICD-10-CM

## 2020-10-28 DIAGNOSIS — R2681 Unsteadiness on feet: Secondary | ICD-10-CM

## 2020-10-28 DIAGNOSIS — R2689 Other abnormalities of gait and mobility: Secondary | ICD-10-CM

## 2020-10-28 NOTE — Therapy (Signed)
[place] MAIN [facility] SERVICES [address] [place], [place], [address] Phone: [phone]   Fax:  [phone]  Physical Therapy Treatment/ Physical Therapy Progress Note 08/31/20 - 10/28/20  Patient Details  Name: Daniel Eaton. MRN: [id number] Date of Birth: 30-Jun-1940 Referring Provider (PT): Dr. [name] MD   Encounter Date: 10/28/2020   PT End of Session - 10/28/20 1611     Visit Number 20    Number of Visits 41    Date for PT Re-Evaluation 12/29/20    Authorization Type 9/10 08/26/20    PT Start Time 1519    PT Stop Time 1600    PT Time Calculation (min) 41 min    Equipment Utilized During Treatment Gait belt    Activity Tolerance Patient limited by fatigue;No increased pain    Behavior During Therapy WFL for tasks assessed/performed             Past Medical History:  Diagnosis Date   A-fib ([place])    Cellulitis    CHF (congestive heart failure) (HCC)    COPD (chronic obstructive pulmonary disease) ([place])    Difficult intubation    Hyperlipidemia    Hypertension    Hypothyroidism    Kidney disease    Sleep apnea    Stroke [facility])    Thyroid disease     Past Surgical History:  Procedure Laterality Date   CARDIAC CATHETERIZATION     CATARACT EXTRACTION W/ INTRAOCULAR LENS  IMPLANT, BILATERAL     COLONOSCOPY  2008   HERNIA REPAIR     bilateral inguinal hernia/ [facility]   HERNIA REPAIR  02/02/2015   18 x 28 cm ventral light mesh placed laparoscopically.   TONSILLECTOMY     UMBILICAL HERNIA REPAIR N/A 02/02/2015   Procedure: HERNIA REPAIR UMBILICAL ADULT;  Surgeon: Robert Bellow, MD;  Location: [facility] ORS;  Service: General;  Laterality: N/A;   VENTRAL HERNIA REPAIR N/A 02/02/2015   Procedure: LAPAROSCOPIC VENTRAL HERNIA;  Surgeon: Robert Bellow, MD;  Location: [facility] ORS;  Service: General;  Laterality: N/A;   vp shunt placement  1979    There were no vitals filed for this visit.   Subjective  Assessment - 10/28/20 1523     Subjective Patient states he is doing well today. Reports he feels a little more secure on his feet each day and that he is compliant with HEP.    Pertinent History Pt is a 80 y.o. male referred to PT for Physical deconditioning. Pt is known to this clinic and has received rehab here in 2019. Pt has a significant PMH of COPD, A-fib, Aortic Atherosclerosis, ischemic cardiomyopathy, benign hypertension with chronic kidney disease, stage III (CMS-HCC); Essential hypertension; Chronic bronchitis, unspecified chronic bronchitis type (CMS-HCC); OSA (obstructive sleep apnea); Morbid obesity with BMI of 40.0-44.9, adult (CMS-HCC) per last cardiology visit. Pt reports being unable to being able to participate in PT during his stay at [facility] this previous October and also tested positive for COVID-19.  Pt's goal is to focus on gait, balance, LE strength, and walking endurance with RW. Was last seen by this therapist on 05/27/20.    Limitations Standing;House hold activities;Walking;Lifting    How long can you sit comfortably? hour    How long can you stand comfortably? not sure    How long can you walk comfortably? walk with RW to bathroom and back    Patient Stated Goals Improve gait, balance, and LE strength. Reduce  risk of falls.    Currently in Pain? No/denies             TREATMENT:   Outcome Measures: FOTO - 45 10-MWT - 16.3 seconds >>> 0.61 m/s 2 minute walk - 117f    Ambulation to support bar - 51fusing RW, CGA for safety STS - multiple reps throughout session after seated rest breaks, RW and CGA for safety.  Standing at support bar: -hip flexion march x10 reps bilaterally,  -lateral stepping length of support bar, 1 minute with no rest breaks. Close CGA     Seated: Row, RTB x10 reps with VC and focus on scapular retraction for improved postural strength;  Shoulder extension x10 reps with VC for improved posture during movement; Patient  motivated and participated well within session.  Patient tolerated session well.  Denies any increase in pain;      Pt educated throughout session about proper posture and technique with exercises. Improved exercise technique, movement at target joints, use of target muscles after min to mod verbal, visual, tactile cues.   Patient is pleasant and highly motivated throughout physical therapy session. Outcome measures were assessed as a progress note was due for his 20th visit. He continues to demonstrate progress with slight improvements in functional mobility including gait speed and ambulatory endurance. His FOTO score also improved indicating improved functioning subjectively. His lower extremities and cardiopulmonary system did fatigue with ambulation and standing strength requiring frequent seated rest breaks. Patient would benefit from additional skilled PT intervention to improve strength, balance and mobility.  Patient's condition has the potential to improve in response to therapy. Maximum improvement is yet to be obtained. The anticipated improvement is attainable and reasonable in a generally predictable time.           PT Short Term Goals - 08/26/20 1154       PT SHORT TERM GOAL #1   Title Pt will be compliant with given HEP to improve LE muscle strength.    Baseline 6/1: HEP added to 7/13: HEP compliant    Time 6    Period Weeks    Status Achieved    Target Date 08/26/20      PT SHORT TERM GOAL #2   Title Patient will stand from standard height chair on first attempt to allow for improved mobility.     Baseline 6/1: unable to perform 7/13: MET    Time 6    Period Weeks    Status Achieved    Target Date 08/26/20               PT Long Term Goals - 10/28/20 1631       PT LONG TERM GOAL #1   Title Pt will improve FOTO goal to 48 to display perceived improvements in functional mobility.    Baseline 6/1: 40% 7/13: 45% 8/23: 40%,9/14: 45%    Time 12    Period Weeks     Status Partially Met    Target Date 12/29/20      PT LONG TERM GOAL #2   Title Patient (> 6049ears old) will complete five times sit to stand test in < 1 minute indicating an increased LE strength and improved balance.    Baseline 6/1: 1 min 31 seconds with heavy BUE support 7/13: 49 seconds with heavy BUE support    Time 12    Period Weeks    Status Achieved      PT LONG TERM GOAL #3  Title Patient will increase BLE gross strength to 4+/5 as to improve functional strength for independent gait, increased standing tolerance and increased ADL ability.    Baseline 6/1: see note 7/13: see note 8/23: grossly 4/5 bilaterally with hip extension 3+/5    Time 12    Period Weeks    Status Partially Met      PT LONG TERM GOAL #4   Title Patient will increase 10 meter walk test to >1.0 m/s as to improve gait speed for better community ambulation and to reduce fall risk.    Baseline 6/1: 0.41 m/s with RW 7/13: 0.6 m/s with walker 8/23: 0.6 m/s with walker 9/14: 0.61 m/s with RW    Time 12    Period Weeks    Status On-going    Target Date 12/29/20      PT LONG TERM GOAL #5   Title Patient will stand without UE support >3 minutes for ADL performance and increased stability with independent movement.    Baseline 6/1: 1 min 7 seconds 7/13: 3 min 3 seconds    Time 12    Period Weeks    Status Achieved      PT LONG TERM GOAL #6   Title Patient will increase Berg Balance score by > 6 points (29/56 )  to demonstrate decreased fall risk during functional activities.    Baseline 8/23: 23/56    Time 12    Period Weeks    Status New      PT LONG TERM GOAL #7   Title Patient will ambulate >300 ft during 2 minute walk test with RW for improved capacity for functional mobility.    Baseline 8/23: 88 ft with frequent standing breaks; 9/14: 150f    Time 12    Period Weeks    Status Partially Met    Target Date 12/29/20                   Plan - 10/28/20 1612     Clinical Impression  Statement Patient is pleasant and highly motivated throughout physical therapy session. Outcome measures were assessed as a progress note was due for his 20th visit. He continues to demonstrate progress with slight improvements in functional mobility including gait speed and ambulatory endurance. His FOTO score also improved indicating improved functioning subjectively. His lower extremities and cardiopulmonary system did fatigue with ambulation and standing strength requiring frequent seated rest breaks. Patient would benefit from additional skilled PT intervention to improve strength, balance and mobility.  Patient's condition has the potential to improve in response to therapy. Maximum improvement is yet to be obtained. The anticipated improvement is attainable and reasonable in a generally predictable time.    Personal Factors and Comorbidities Age;Comorbidity 3+;Past/Current Experience;Fitness    Comorbidities OPD, A-fib, Aortic Atherosclerosis, ischemic cardiomyopathy, Benign hypertension with chronic kidney disease, stage III (CMS-HCC); Essential hypertension; Chronic bronchitis, unspecified chronic bronchitis type (CMS-HCC); OSA (obstructive sleep apnea); Morbid obesity with BMI of 40.0-44.9, adult (CMS-HCC)    Examination-Activity Limitations Bend;Carry;Squat;Continence;Stand;Transfers;Locomotion Level;Reach Overhead;Bed Mobility;Lift;Stairs;Toileting;Dressing    Examination-Participation Restrictions Community Activity;Laundry;Yard Work;Cleaning;Driving;Meal Prep;Shop    Stability/Clinical Decision Making Evolving/Moderate complexity    Rehab Potential Fair    PT Frequency 2x / week    PT Duration 12 weeks    PT Treatment/Interventions ADLs/Self Care Home Management;Moist Heat;Cryotherapy;Electrical Stimulation;Functional mobility training;Therapeutic activities;Therapeutic exercise;DME Instruction;Gait training;Balance training;Neuromuscular re-education;Patient/family education;Energy  conservation;Biofeedback;Ultrasound;Stair training;Orthotic Fit/Training;Manual techniques;Manual lymph drainage;Dry needling;Passive range of motion;Taping;Vestibular;Visual/perceptual remediation/compensation    PT Next Visit Plan progress  note    PT Home Exercise Plan No changes    Consulted and Agree with Plan of Care Patient             Patient will benefit from skilled therapeutic intervention in order to improve the following deficits and impairments:  Abnormal gait, Difficulty walking, Cardiopulmonary status limiting activity, Decreased endurance, Impaired UE functional use, Obesity, Decreased activity tolerance, Decreased balance, Improper body mechanics, Decreased mobility, Decreased strength, Impaired sensation, Postural dysfunction, Increased edema, Impaired flexibility, Decreased range of motion, Decreased coordination, Pain  Visit Diagnosis: Abnormality of gait and mobility  Difficulty in walking, not elsewhere classified  Muscle weakness (generalized)  Unsteadiness on feet  Other abnormalities of gait and mobility     Problem List Patient Active Problem List   Diagnosis Date Noted   Atrial fibrillation ([place]) 04/09/2019   Essential hypertension 04/09/2019   Venous stasis dermatitis of both lower extremities 04/09/2019   Lower limb ulcer, ankle, right, limited to breakdown of skin ([place]) 04/09/2019   Rhabdomyolysis 12/10/2018   Sepsis ([place]) 07/28/2018   Cough 03/20/2015   Bronchitis, chronic obstructive w acute bronchitis ([place]) 29/56/2130   Umbilical hernia without obstruction and without gangrene 01/12/2015   Ventral hernia without obstruction or gangrene 01/02/2015    Patrina Levering PT, DPT  Ramonita Lab, PT 10/28/2020, 4:35 PM  [place] MAIN [facility] SERVICES [address] [place], [place], [address] Phone: [phone]   Fax:  [phone]  Name: Marbin Olshefski. MRN: [id number] Date of Birth: 1940-11-29

## 2020-11-02 ENCOUNTER — Other Ambulatory Visit: Payer: Self-pay

## 2020-11-02 ENCOUNTER — Ambulatory Visit: Payer: HMO

## 2020-11-02 DIAGNOSIS — R269 Unspecified abnormalities of gait and mobility: Secondary | ICD-10-CM | POA: Diagnosis not present

## 2020-11-02 DIAGNOSIS — R262 Difficulty in walking, not elsewhere classified: Secondary | ICD-10-CM

## 2020-11-02 DIAGNOSIS — R2689 Other abnormalities of gait and mobility: Secondary | ICD-10-CM

## 2020-11-02 DIAGNOSIS — R2681 Unsteadiness on feet: Secondary | ICD-10-CM

## 2020-11-02 DIAGNOSIS — M6281 Muscle weakness (generalized): Secondary | ICD-10-CM

## 2020-11-02 NOTE — Therapy (Signed)
[place] MAIN [facility] SERVICES [address] [place], [place], [address] Phone: [phone]   Fax:  [phone]  Physical Therapy Treatment  Patient Details  Name: Daniel Eaton. MRN: [id number] Date of Birth: 07-27-1940 Referring Provider (PT): Dr. [name] MD   Encounter Date: 11/02/2020   PT End of Session - 11/02/20 1727     Visit Number 21    Number of Visits 41    Date for PT Re-Evaluation 12/29/20    Authorization Type [employer]    PT Start Time 1655    PT Stop Time 1730    PT Time Calculation (min) 35 min    Equipment Utilized During Treatment Gait belt    Activity Tolerance Patient limited by fatigue;No increased pain    Behavior During Therapy WFL for tasks assessed/performed             Past Medical History:  Diagnosis Date   A-fib ([place])    Cellulitis    CHF (congestive heart failure) (HCC)    COPD (chronic obstructive pulmonary disease) ([place])    Difficult intubation    Hyperlipidemia    Hypertension    Hypothyroidism    Kidney disease    Sleep apnea    Stroke [facility])    Thyroid disease     Past Surgical History:  Procedure Laterality Date   CARDIAC CATHETERIZATION     CATARACT EXTRACTION W/ INTRAOCULAR LENS  IMPLANT, BILATERAL     COLONOSCOPY  2008   HERNIA REPAIR     bilateral inguinal hernia/ [facility]   HERNIA REPAIR  02/02/2015   18 x 28 cm ventral light mesh placed laparoscopically.   TONSILLECTOMY     UMBILICAL HERNIA REPAIR N/A 02/02/2015   Procedure: HERNIA REPAIR UMBILICAL ADULT;  Surgeon: Robert Bellow, MD;  Location: [facility] ORS;  Service: General;  Laterality: N/A;   VENTRAL HERNIA REPAIR N/A 02/02/2015   Procedure: LAPAROSCOPIC VENTRAL HERNIA;  Surgeon: Robert Bellow, MD;  Location: [facility] ORS;  Service: General;  Laterality: N/A;   vp shunt placement  1979    There were no vitals filed for this visit.   Subjective Assessment - 11/02/20 1747     Subjective Pt  reports no updates since prior PT session, no falls, still on top of HEP. Pt reports resuming one of his diuretics last week, twice weekly, which continues to really compromise his energy overall. His soreness in quads/calves persists.    Pertinent History Pt is a 80 y.o. male referred to PT for Physical deconditioning. Pt is known to this clinic and has received rehab here in 2019. Pt has a significant PMH of COPD, A-fib, Aortic Atherosclerosis, ischemic cardiomyopathy, benign hypertension with chronic kidney disease, stage III (CMS-HCC); Essential hypertension; Chronic bronchitis, unspecified chronic bronchitis type (CMS-HCC); OSA (obstructive sleep apnea); Morbid obesity with BMI of 40.0-44.9, adult (CMS-HCC) per last cardiology visit. Pt reports being unable to being able to participate in PT during his stay at [facility] this previous October and also tested positive for COVID-19.  Pt's goal is to focus on gait, balance, LE strength, and walking endurance with RW. Was last seen by this therapist on 05/27/20.    Currently in Pain? --   No pain ,just sore all over.              TREATMENT:  -standing marching in place x20, BUE support on bars -standing heel raises x10, DC'd as patient is unable to achieve A/ROM (  mostly sway of body)  -lateral side stepping 3x in // bars  Seated recovery   -seated heel raises starting in ankle DF 1x15 bilat cues for full available range  -forward/backwards walking 4x length of // bars, cues for increased step length, stops after increased SOB SpO2: 98%, HR: 118 bpm in AF (irregular beat)   Seated recovery   -STS from chair, Heavy UE push from chair arms, use of // bars 1x5, severe soreness and weakness limitation from bilat thighs           PT Education - 11/02/20 1727     Education provided Yes    Education Details pacing self, staying away from too high of intensity in exercises.    Person(s) Educated Patient    Methods  Explanation;Demonstration    Comprehension Verbalized understanding;Returned demonstration              PT Short Term Goals - 08/26/20 1154       PT SHORT TERM GOAL #1   Title Pt will be compliant with given HEP to improve LE muscle strength.    Baseline 6/1: HEP added to 7/13: HEP compliant    Time 6    Period Weeks    Status Achieved    Target Date 08/26/20      PT SHORT TERM GOAL #2   Title Patient will stand from standard height chair on first attempt to allow for improved mobility.     Baseline 6/1: unable to perform 7/13: MET    Time 6    Period Weeks    Status Achieved    Target Date 08/26/20               PT Long Term Goals - 10/28/20 1631       PT LONG TERM GOAL #1   Title Pt will improve FOTO goal to 48 to display perceived improvements in functional mobility.    Baseline 6/1: 40% 7/13: 45% 8/23: 40%,9/14: 45%    Time 12    Period Weeks    Status Partially Met    Target Date 12/29/20      PT LONG TERM GOAL #2   Title Patient (> 34 years old) will complete five times sit to stand test in < 1 minute indicating an increased LE strength and improved balance.    Baseline 6/1: 1 min 31 seconds with heavy BUE support 7/13: 49 seconds with heavy BUE support    Time 12    Period Weeks    Status Achieved      PT LONG TERM GOAL #3   Title Patient will increase BLE gross strength to 4+/5 as to improve functional strength for independent gait, increased standing tolerance and increased ADL ability.    Baseline 6/1: see note 7/13: see note 8/23: grossly 4/5 bilaterally with hip extension 3+/5    Time 12    Period Weeks    Status Partially Met      PT LONG TERM GOAL #4   Title Patient will increase 10 meter walk test to >1.0 m/s as to improve gait speed for better community ambulation and to reduce fall risk.    Baseline 6/1: 0.41 m/s with RW 7/13: 0.6 m/s with walker 8/23: 0.6 m/s with walker 9/14: 0.61 m/s with RW    Time 12    Period Weeks    Status  On-going    Target Date 12/29/20      PT LONG TERM GOAL #5  Title Patient will stand without UE support >3 minutes for ADL performance and increased stability with independent movement.    Baseline 6/1: 1 min 7 seconds 7/13: 3 min 3 seconds    Time 12    Period Weeks    Status Achieved      PT LONG TERM GOAL #6   Title Patient will increase Berg Balance score by > 6 points (29/56 )  to demonstrate decreased fall risk during functional activities.    Baseline 8/23: 23/56    Time 12    Period Weeks    Status New      PT LONG TERM GOAL #7   Title Patient will ambulate >300 ft during 2 minute walk test with RW for improved capacity for functional mobility.    Baseline 8/23: 88 ft with frequent standing breaks; 9/14: 155f    Time 12    Period Weeks    Status Partially Met    Target Date 12/29/20                   Plan - 11/02/20 1728     Clinical Impression Statement Continued with current plan of care as laid out in evaluation and recent prior sessions. Pt remains motivated to advance progress toward goals. Rest breaks provided as needed, pt quick to ask when needed. Author maintains all interventions within appropriate level of intensity as not to purposefully exacerbate pain or fatigue. Pt requires varying levels of assistance and cuing for completion of exercises for correct form and sometimes due to pain/weakness. No updates to HEP this date.    Personal Factors and Comorbidities Age;Comorbidity 3+;Past/Current Experience;Fitness    Comorbidities OPD, A-fib, Aortic Atherosclerosis, ischemic cardiomyopathy, Benign hypertension with chronic kidney disease, stage III (CMS-HCC); Essential hypertension; Chronic bronchitis, unspecified chronic bronchitis type (CMS-HCC); OSA (obstructive sleep apnea); Morbid obesity with BMI of 40.0-44.9, adult (CMS-HCC)    Examination-Activity Limitations Bend;Carry;Squat;Continence;Stand;Transfers;Locomotion Level;Reach Overhead;Bed  Mobility;Lift;Stairs;Toileting;Dressing    Examination-Participation Restrictions Community Activity;Laundry;Yard Work;Cleaning;Driving;Meal Prep;Shop    Stability/Clinical Decision Making Evolving/Moderate complexity    Clinical Decision Making Moderate    Rehab Potential Fair    Clinical Impairments Affecting Rehab Potential Significant cardiopulmonary pathologies.    PT Frequency 2x / week    PT Duration 12 weeks    PT Treatment/Interventions ADLs/Self Care Home Management;Moist Heat;Cryotherapy;Electrical Stimulation;Functional mobility training;Therapeutic activities;Therapeutic exercise;DME Instruction;Gait training;Balance training;Neuromuscular re-education;Patient/family education;Energy conservation;Biofeedback;Ultrasound;Stair training;Orthotic Fit/Training;Manual techniques;Manual lymph drainage;Dry needling;Passive range of motion;Taping;Vestibular;Visual/perceptual remediation/compensation    PT Next Visit Plan conitnue with general strenghtening, monitor HR manually, don't compromize volume of exercise for excessively high intensity.    PT Home Exercise Plan No updates, but enouraged pt to not overdo HEP to the point where his increased BR mobility is unsafe    Consulted and Agree with Plan of Care Patient             Patient will benefit from skilled therapeutic intervention in order to improve the following deficits and impairments:  Abnormal gait, Difficulty walking, Cardiopulmonary status limiting activity, Decreased endurance, Impaired UE functional use, Obesity, Decreased activity tolerance, Decreased balance, Improper body mechanics, Decreased mobility, Decreased strength, Impaired sensation, Postural dysfunction, Increased edema, Impaired flexibility, Decreased range of motion, Decreased coordination, Pain  Visit Diagnosis: Abnormality of gait and mobility  Difficulty in walking, not elsewhere classified  Muscle weakness (generalized)  Unsteadiness on feet  Other  abnormalities of gait and mobility     Problem List Patient Active Problem List   Diagnosis Date Noted  Atrial fibrillation ([place]) 04/09/2019   Essential hypertension 04/09/2019   Venous stasis dermatitis of both lower extremities 04/09/2019   Lower limb ulcer, ankle, right, limited to breakdown of skin ([place]) 04/09/2019   Rhabdomyolysis 12/10/2018   Sepsis ([place]) 07/28/2018   Cough 03/20/2015   Bronchitis, chronic obstructive w acute bronchitis ([place]) 42/55/2589   Umbilical hernia without obstruction and without gangrene 01/12/2015   Ventral hernia without obstruction or gangrene 01/02/2015   5:52 PM, 11/02/20 Etta Grandchild, PT, DPT Physical Therapist - [place] [phone]     [place], PT 11/02/2020, 5:49 PM  [place] [address] [place], [place], [address] Phone: [phone]   Fax:  [phone]  Name: Finnean Cerami. MRN: [id number] Date of Birth: 1941/02/06

## 2020-11-04 DIAGNOSIS — M2042 Other hammer toe(s) (acquired), left foot: Secondary | ICD-10-CM | POA: Diagnosis not present

## 2020-11-04 DIAGNOSIS — E1142 Type 2 diabetes mellitus with diabetic polyneuropathy: Secondary | ICD-10-CM | POA: Diagnosis not present

## 2020-11-04 DIAGNOSIS — L600 Ingrowing nail: Secondary | ICD-10-CM | POA: Diagnosis not present

## 2020-11-04 DIAGNOSIS — L03031 Cellulitis of right toe: Secondary | ICD-10-CM | POA: Diagnosis not present

## 2020-11-04 DIAGNOSIS — M2041 Other hammer toe(s) (acquired), right foot: Secondary | ICD-10-CM | POA: Diagnosis not present

## 2020-11-05 ENCOUNTER — Ambulatory Visit: Payer: HMO

## 2020-11-09 ENCOUNTER — Ambulatory Visit: Payer: HMO

## 2020-11-09 ENCOUNTER — Other Ambulatory Visit: Payer: Self-pay

## 2020-11-09 DIAGNOSIS — R269 Unspecified abnormalities of gait and mobility: Secondary | ICD-10-CM | POA: Diagnosis not present

## 2020-11-09 DIAGNOSIS — M6281 Muscle weakness (generalized): Secondary | ICD-10-CM

## 2020-11-09 DIAGNOSIS — R262 Difficulty in walking, not elsewhere classified: Secondary | ICD-10-CM

## 2020-11-09 DIAGNOSIS — R2681 Unsteadiness on feet: Secondary | ICD-10-CM

## 2020-11-09 NOTE — Therapy (Signed)
[place] MAIN [facility] SERVICES [address] [place], [place], [address] Phone: [phone]   Fax:  [phone]  Physical Therapy Treatment  Patient Details  Name: Daniel Eaton. MRN: [id number] Date of Birth: [birth year] Referring Provider (PT): Dr. [name] MD   Encounter Date: 11/09/2020   PT End of Session - 11/09/20 1615     Visit Number 22    Number of Visits 41    Date for PT Re-Evaluation 12/29/20    Authorization Type [employer]    PT Start Time 1600    PT Stop Time 1646    PT Time Calculation (min) 46 min    Equipment Utilized During Treatment Gait belt    Activity Tolerance Patient limited by fatigue;No increased pain    Behavior During Therapy WFL for tasks assessed/performed             Past Medical History:  Diagnosis Date   A-fib ([place])    Cellulitis    CHF (congestive heart failure) (HCC)    COPD (chronic obstructive pulmonary disease) ([place])    Difficult intubation    Hyperlipidemia    Hypertension    Hypothyroidism    Kidney disease    Sleep apnea    Stroke [facility])    Thyroid disease     Past Surgical History:  Procedure Laterality Date   CARDIAC CATHETERIZATION     CATARACT EXTRACTION W/ INTRAOCULAR LENS  IMPLANT, BILATERAL     COLONOSCOPY  2008   HERNIA REPAIR     bilateral inguinal hernia/ [facility]   HERNIA REPAIR  02/02/2015   18 x 28 cm ventral light mesh placed laparoscopically.   TONSILLECTOMY     UMBILICAL HERNIA REPAIR N/A 02/02/2015   Procedure: HERNIA REPAIR UMBILICAL ADULT;  Surgeon: Robert Bellow, MD;  Location: [facility] ORS;  Service: General;  Laterality: N/A;   VENTRAL HERNIA REPAIR N/A 02/02/2015   Procedure: LAPAROSCOPIC VENTRAL HERNIA;  Surgeon: Robert Bellow, MD;  Location: [facility] ORS;  Service: General;  Laterality: N/A;   vp shunt placement  1979    There were no vitals filed for this visit.   Subjective Assessment - 11/09/20 1612     Subjective  Patient reports no new complaints and no significant changes since last visit. Reports taking a diuretic and otherwise doing well today. Reports he had to change to his schedule and 4pm will hopefully work out better.    Pertinent History Pt is a 80 y.o. male referred to PT for Physical deconditioning. Pt is known to this clinic and has received rehab here in 2019. Pt has a significant PMH of COPD, A-fib, Aortic Atherosclerosis, ischemic cardiomyopathy, benign hypertension with chronic kidney disease, stage III (CMS-HCC); Essential hypertension; Chronic bronchitis, unspecified chronic bronchitis type (CMS-HCC); OSA (obstructive sleep apnea); Morbid obesity with BMI of 40.0-44.9, adult (CMS-HCC) per last cardiology visit. Pt reports being unable to being able to participate in PT during his stay at [facility] this previous October and also tested positive for COVID-19.  Pt's goal is to focus on gait, balance, LE strength, and walking endurance with RW. Was last seen by this therapist on 05/27/20.    Currently in Pain? Yes    Pain Score 6     Pain Location Leg    Pain Orientation Right;Left    Pain Descriptors / Indicators Sore    Pain Type Chronic pain    Pain Onset More than a month ago  Pain Frequency Constant    Aggravating Factors  active movement    Pain Relieving Factors Rest    Effect of Pain on Daily Activities Difficulty with ADLs             INTERVENTIONS:   Therapeutic Exercises:   Standing in parallel bars: -Step tap BLE (alternating) onto 6" step box with min UE support -Forward walking x length of // bars with minimal to no UE support followed by reciprocal backward gait with BUE support x 4 times down and back -Heel raises x10 reps with cues to reduce UE weight bearing to challenge stance control, CGA for safety;  -lateral side stepping 4x (cones placed as step marker)  length of // bars. Close CGA     Patient motivated and participated well within session. Vitals  monitored throughout session. Patient tolerated session well but fatigued at end of session. He denies any increase in pain;  Pt educated throughout session about proper posture and technique with exercises. Improved exercise technique, movement at target joints, use of target muscles after min to mod verbal, visual, tactile cues.     Clinical Impression: Patient was well motivated throughout session today- able to participate well with short rest breaks. He was response to VC and visual demo for correct exercise technique. He was able to walk well in // bars without UE support forward today and performed well with side stepping with only min VC to increase step width as able. Patient would benefit from additional skilled PT Intervention to improve strength, balance and mobility                   PT Education - 11/09/20 1614     Education provided Yes    Education Details Exercise technique    Person(s) Educated Patient    Methods Explanation;Demonstration;Tactile cues;Verbal cues    Comprehension Verbalized understanding;Returned demonstration;Verbal cues required;Tactile cues required;Need further instruction              PT Short Term Goals - 08/26/20 1154       PT SHORT TERM GOAL #1   Title Pt will be compliant with given HEP to improve LE muscle strength.    Baseline 6/1: HEP added to 7/13: HEP compliant    Time 6    Period Weeks    Status Achieved    Target Date 08/26/20      PT SHORT TERM GOAL #2   Title Patient will stand from standard height chair on first attempt to allow for improved mobility.     Baseline 6/1: unable to perform 7/13: MET    Time 6    Period Weeks    Status Achieved    Target Date 08/26/20               PT Long Term Goals - 10/28/20 1631       PT LONG TERM GOAL #1   Title Pt will improve FOTO goal to 48 to display perceived improvements in functional mobility.    Baseline 6/1: 40% 7/13: 45% 8/23: 40%,9/14: 45%    Time 12     Period Weeks    Status Partially Met    Target Date 12/29/20      PT LONG TERM GOAL #2   Title Patient (> 58 years old) will complete five times sit to stand test in < 1 minute indicating an increased LE strength and improved balance.    Baseline 6/1: 1 min 31 seconds with heavy  BUE support 7/13: 49 seconds with heavy BUE support    Time 12    Period Weeks    Status Achieved      PT LONG TERM GOAL #3   Title Patient will increase BLE gross strength to 4+/5 as to improve functional strength for independent gait, increased standing tolerance and increased ADL ability.    Baseline 6/1: see note 7/13: see note 8/23: grossly 4/5 bilaterally with hip extension 3+/5    Time 12    Period Weeks    Status Partially Met      PT LONG TERM GOAL #4   Title Patient will increase 10 meter walk test to >1.0 m/s as to improve gait speed for better community ambulation and to reduce fall risk.    Baseline 6/1: 0.41 m/s with RW 7/13: 0.6 m/s with walker 8/23: 0.6 m/s with walker 9/14: 0.61 m/s with RW    Time 12    Period Weeks    Status On-going    Target Date 12/29/20      PT LONG TERM GOAL #5   Title Patient will stand without UE support >3 minutes for ADL performance and increased stability with independent movement.    Baseline 6/1: 1 min 7 seconds 7/13: 3 min 3 seconds    Time 12    Period Weeks    Status Achieved      PT LONG TERM GOAL #6   Title Patient will increase Berg Balance score by > 6 points (29/56 )  to demonstrate decreased fall risk during functional activities.    Baseline 8/23: 23/56    Time 12    Period Weeks    Status New      PT LONG TERM GOAL #7   Title Patient will ambulate >300 ft during 2 minute walk test with RW for improved capacity for functional mobility.    Baseline 8/23: 88 ft with frequent standing breaks; 9/14: 170f    Time 12    Period Weeks    Status Partially Met    Target Date 12/29/20                   Plan - 11/09/20 1616      Clinical Impression Statement Patient was well motivated throughout session today- able to participate well with short rest breaks. He was response to VC and visual demo for correct exercise technique. He was able to walk well in // bars without UE support forward today and performed well with side stepping with only min VC to increase step width as able. Patient would benefit from additional skilled PT Intervention to improve strength, balance and mobility.    Personal Factors and Comorbidities Age;Comorbidity 3+;Past/Current Experience;Fitness    Comorbidities OPD, A-fib, Aortic Atherosclerosis, ischemic cardiomyopathy, Benign hypertension with chronic kidney disease, stage III (CMS-HCC); Essential hypertension; Chronic bronchitis, unspecified chronic bronchitis type (CMS-HCC); OSA (obstructive sleep apnea); Morbid obesity with BMI of 40.0-44.9, adult (CMS-HCC)    Examination-Activity Limitations Bend;Carry;Squat;Continence;Stand;Transfers;Locomotion Level;Reach Overhead;Bed Mobility;Lift;Stairs;Toileting;Dressing    Examination-Participation Restrictions Community Activity;Laundry;Yard Work;Cleaning;Driving;Meal Prep;Shop    Stability/Clinical Decision Making Evolving/Moderate complexity    Rehab Potential Fair    Clinical Impairments Affecting Rehab Potential Significant cardiopulmonary pathologies.    PT Frequency 2x / week    PT Duration 12 weeks    PT Treatment/Interventions ADLs/Self Care Home Management;Moist Heat;Cryotherapy;Electrical Stimulation;Functional mobility training;Therapeutic activities;Therapeutic exercise;DME Instruction;Gait training;Balance training;Neuromuscular re-education;Patient/family education;Energy conservation;Biofeedback;Ultrasound;Stair training;Orthotic Fit/Training;Manual techniques;Manual lymph drainage;Dry needling;Passive range of motion;Taping;Vestibular;Visual/perceptual remediation/compensation    PT  Next Visit Plan conitnue with general strenghtening, monitor  HR manually, don't compromize volume of exercise for excessively high intensity.    PT Home Exercise Plan No updates, but enouraged pt to not overdo HEP to the point where his increased BR mobility is unsafe    Consulted and Agree with Plan of Care Patient             Patient will benefit from skilled therapeutic intervention in order to improve the following deficits and impairments:  Abnormal gait, Difficulty walking, Cardiopulmonary status limiting activity, Decreased endurance, Impaired UE functional use, Obesity, Decreased activity tolerance, Decreased balance, Improper body mechanics, Decreased mobility, Decreased strength, Impaired sensation, Postural dysfunction, Increased edema, Impaired flexibility, Decreased range of motion, Decreased coordination, Pain  Visit Diagnosis: Abnormality of gait and mobility  Difficulty in walking, not elsewhere classified  Muscle weakness (generalized)  Unsteadiness on feet     Problem List Patient Active Problem List   Diagnosis Date Noted   Atrial fibrillation ([place]) 04/09/2019   Essential hypertension 04/09/2019   Venous stasis dermatitis of both lower extremities 04/09/2019   Lower limb ulcer, ankle, right, limited to breakdown of skin ([place]) 04/09/2019   Rhabdomyolysis 12/10/2018   Sepsis ([place]) 07/28/2018   Cough 03/20/2015   Bronchitis, chronic obstructive w acute bronchitis ([place]) 88/91/6945   Umbilical hernia without obstruction and without gangrene 01/12/2015   Ventral hernia without obstruction or gangrene 01/02/2015    Lewis Moccasin, PT 11/10/2020, 5:47 PM  [place] [address] [place], [place], [address] Phone: [phone]   Fax:  [phone]  Name: Daniel Eaton. MRN: [id number] Date of Birth: 04-03-1940

## 2020-11-11 ENCOUNTER — Ambulatory Visit: Payer: HMO

## 2020-11-11 ENCOUNTER — Other Ambulatory Visit: Payer: Self-pay

## 2020-11-11 DIAGNOSIS — R269 Unspecified abnormalities of gait and mobility: Secondary | ICD-10-CM | POA: Diagnosis not present

## 2020-11-11 DIAGNOSIS — R2689 Other abnormalities of gait and mobility: Secondary | ICD-10-CM

## 2020-11-11 DIAGNOSIS — R2681 Unsteadiness on feet: Secondary | ICD-10-CM

## 2020-11-11 DIAGNOSIS — R262 Difficulty in walking, not elsewhere classified: Secondary | ICD-10-CM

## 2020-11-11 DIAGNOSIS — M6281 Muscle weakness (generalized): Secondary | ICD-10-CM

## 2020-11-11 NOTE — Therapy (Signed)
[place] MAIN [facility] SERVICES [address] [place], [place], [address] Phone: [phone]   Fax:  [phone]  Physical Therapy Treatment  Patient Details  Name: Daniel Eaton. MRN: [id number] Date of Birth: Dec 08, 1940 Referring Provider (PT): Dr. [name] MD   Encounter Date: 11/11/2020   PT End of Session - 11/11/20 1702     Visit Number 23    Number of Visits 41    Date for PT Re-Evaluation 12/29/20    Authorization Type [employer]    Authorization Time Period 10/06/20-12/29/20    PT Start Time 1600    PT Stop Time 1645    PT Time Calculation (min) 45 min    Equipment Utilized During Treatment Gait belt    Activity Tolerance Patient limited by fatigue;Patient tolerated treatment well;Patient limited by pain    Behavior During Therapy WFL for tasks assessed/performed             Past Medical History:  Diagnosis Date   A-fib ([place])    Cellulitis    CHF (congestive heart failure) ([place])    COPD (chronic obstructive pulmonary disease) ([place])    Difficult intubation    Hyperlipidemia    Hypertension    Hypothyroidism    Kidney disease    Sleep apnea    Stroke [facility])    Thyroid disease     Past Surgical History:  Procedure Laterality Date   CARDIAC CATHETERIZATION     CATARACT EXTRACTION W/ INTRAOCULAR LENS  IMPLANT, BILATERAL     COLONOSCOPY  2008   HERNIA REPAIR     bilateral inguinal hernia/ [facility]   HERNIA REPAIR  02/02/2015   18 x 28 cm ventral light mesh placed laparoscopically.   TONSILLECTOMY     UMBILICAL HERNIA REPAIR N/A 02/02/2015   Procedure: HERNIA REPAIR UMBILICAL ADULT;  Surgeon: Robert Bellow, MD;  Location: [facility] ORS;  Service: General;  Laterality: N/A;   VENTRAL HERNIA REPAIR N/A 02/02/2015   Procedure: LAPAROSCOPIC VENTRAL HERNIA;  Surgeon: Robert Bellow, MD;  Location: [facility] ORS;  Service: General;  Laterality: N/A;   vp shunt placement  1979    There were no vitals  filed for this visit.   Subjective Assessment - 11/11/20 1641     Subjective Pt doing well in general, reports he has now switched his twice weekly diuretic to different days and is going well. He has noticed reduced fluid retention in thighs and improved breathing with exersion.    Pertinent History Pt is a 80 y.o. male referred to PT for Physical deconditioning. Pt is known to this clinic and has received rehab here in 2019. Pt has a significant PMH of COPD, A-fib, Aortic Atherosclerosis, ischemic cardiomyopathy, benign hypertension with chronic kidney disease, stage III (CMS-HCC); Essential hypertension; Chronic bronchitis, unspecified chronic bronchitis type (CMS-HCC); OSA (obstructive sleep apnea); Morbid obesity with BMI of 40.0-44.9, adult (CMS-HCC) per last cardiology visit. Pt reports being unable to being able to participate in PT during his stay at [facility] this previous October and also tested positive for COVID-19.  Pt's goal is to focus on gait, balance, LE strength, and walking endurance with RW. Was last seen by this therapist on 05/27/20.    Currently in Pain? Yes   typical soreness in thighs, no exacerbation, consistent with recent prior visits.           INTERVENTION THIS DATE:  -STS from transport chair c RW, supervision for setup, minGuard of  chair during transition  -AMB 165f loop, WC follow (airex lift), RW -335m seated rest -AMB 1506foop, WC follow (airex lift), RW -3 minute rest  -Seated WC + airex cable row 7.5lb x20, then 12.5lb x20, axial towel roll between scapulae, manual facilitation of scapular retraction (noted tightness limiting)  -in // bars FWD AMB [id number] support, retro AMB with //bars support x8 *significant dyspnea limitations -Seated heel slides on step 1x20 bilat, unclear if improves mobility in joints due to knee joint discomfort Rt, tolerated well on Left  -Seated heel raises x15 bilat   *several STS transfers from elevated surface pushing  off arm rests throughout session, good demonstration of controlled descent with arms.     PT Education - 11/11/20 1702     Education provided Yes    Education Details Postural cues with rows for scapular retraction, thoracic extension    Person(s) Educated Patient    Methods Explanation    Comprehension Verbalized understanding              PT Short Term Goals - 08/26/20 1154       PT SHORT TERM GOAL #1   Title Pt will be compliant with given HEP to improve LE muscle strength.    Baseline 6/1: HEP added to 7/13: HEP compliant    Time 6    Period Weeks    Status Achieved    Target Date 08/26/20      PT SHORT TERM GOAL #2   Title Patient will stand from standard height chair on first attempt to allow for improved mobility.     Baseline 6/1: unable to perform 7/13: MET    Time 6    Period Weeks    Status Achieved    Target Date 08/26/20               PT Long Term Goals - 10/28/20 1631       PT LONG TERM GOAL #1   Title Pt will improve FOTO goal to 48 to display perceived improvements in functional mobility.    Baseline 6/1: 40% 7/13: 45% 8/23: 40%,9/14: 45%    Time 12    Period Weeks    Status Partially Met    Target Date 12/29/20      PT LONG TERM GOAL #2   Title Patient (> 60 y49rs old) will complete five times sit to stand test in < 1 minute indicating an increased LE strength and improved balance.    Baseline 6/1: 1 min 31 seconds with heavy BUE support 7/13: 49 seconds with heavy BUE support    Time 12    Period Weeks    Status Achieved      PT LONG TERM GOAL #3   Title Patient will increase BLE gross strength to 4+/5 as to improve functional strength for independent gait, increased standing tolerance and increased ADL ability.    Baseline 6/1: see note 7/13: see note 8/23: grossly 4/5 bilaterally with hip extension 3+/5    Time 12    Period Weeks    Status Partially Met      PT LONG TERM GOAL #4   Title Patient will increase 10 meter walk test  to >1.0 m/s as to improve gait speed for better community ambulation and to reduce fall risk.    Baseline 6/1: 0.41 m/s with RW 7/13: 0.6 m/s with walker 8/23: 0.6 m/s with walker 9/14: 0.61 m/s with RW    Time 12  Period Weeks    Status On-going    Target Date 12/29/20      PT LONG TERM GOAL #5   Title Patient will stand without UE support >3 minutes for ADL performance and increased stability with independent movement.    Baseline 6/1: 1 min 7 seconds 7/13: 3 min 3 seconds    Time 12    Period Weeks    Status Achieved      PT LONG TERM GOAL #6   Title Patient will increase Berg Balance score by > 6 points (29/56 )  to demonstrate decreased fall risk during functional activities.    Baseline 8/23: 23/56    Time 12    Period Weeks    Status New      PT LONG TERM GOAL #7   Title Patient will ambulate >300 ft during 2 minute walk test with RW for improved capacity for functional mobility.    Baseline 8/23: 88 ft with frequent standing breaks; 9/14: 160f    Time 12    Period Weeks    Status Partially Met    Target Date 12/29/20                   Plan - 11/11/20 1704     Clinical Impression Statement Continued with current plan of care as laid out in evaluation and recent prior sessions. Pt remains motivated to advance progress toward goals. Started with overground AMB this date, noted improvement in both distance of effort and total cumulative distance. Rest breaks provided as needed, pt quick to ask when needed. Pt requires varying levels of assistance and cuing for completion of exercises for correct form and safety. Pt asks to conserve efforts toward later minutes of session as not to compromise his safety in mobility required for return to home, demonstrative of good awareness of limitations and safety. Pt continues to demonstrate progress toward goals AEB progression of some interventions this date either in volume or intensity.   Personal Factors and Comorbidities  Age;Comorbidity 3+;Past/Current Experience;Fitness    Comorbidities OPD, A-fib, Aortic Atherosclerosis, ischemic cardiomyopathy, Benign hypertension with chronic kidney disease, stage III (CMS-HCC); Essential hypertension; Chronic bronchitis, unspecified chronic bronchitis type (CMS-HCC); OSA (obstructive sleep apnea); Morbid obesity with BMI of 40.0-44.9, adult (CMS-HCC)    Examination-Activity Limitations Bend;Carry;Squat;Continence;Stand;Transfers;Locomotion Level;Reach Overhead;Bed Mobility;Lift;Stairs;Toileting;Dressing    Examination-Participation Restrictions Community Activity;Laundry;Yard Work;Cleaning;Driving;Meal Prep;Shop    Stability/Clinical Decision Making Evolving/Moderate complexity    Clinical Decision Making Moderate    Rehab Potential Fair    Clinical Impairments Affecting Rehab Potential Cardiorespiratory limitations    PT Frequency 2x / week    PT Duration 12 weeks    PT Treatment/Interventions ADLs/Self Care Home Management;Moist Heat;Cryotherapy;Electrical Stimulation;Functional mobility training;Therapeutic activities;Therapeutic exercise;DME Instruction;Gait training;Balance training;Neuromuscular re-education;Patient/family education;Energy conservation;Biofeedback;Ultrasound;Stair training;Orthotic Fit/Training;Manual techniques;Manual lymph drainage;Dry needling;Passive range of motion;Taping;Vestibular;Visual/perceptual remediation/compensation    PT Next Visit Plan Continue with actiivty tolerance, standing tolerance, postural strengthening, overground AMB    PT Home Exercise Plan No updates today    Consulted and Agree with Plan of Care Patient             Patient will benefit from skilled therapeutic intervention in order to improve the following deficits and impairments:  Abnormal gait, Difficulty walking, Cardiopulmonary status limiting activity, Decreased endurance, Impaired UE functional use, Obesity, Decreased activity tolerance, Decreased balance, Improper  body mechanics, Decreased mobility, Decreased strength, Impaired sensation, Postural dysfunction, Increased edema, Impaired flexibility, Decreased range of motion, Decreased coordination, Pain  Visit Diagnosis: Abnormality of  gait and mobility  Difficulty in walking, not elsewhere classified  Muscle weakness (generalized)  Unsteadiness on feet  Other abnormalities of gait and mobility     Problem List Patient Active Problem List   Diagnosis Date Noted   Atrial fibrillation ([place]) 04/09/2019   Essential hypertension 04/09/2019   Venous stasis dermatitis of both lower extremities 04/09/2019   Lower limb ulcer, ankle, right, limited to breakdown of skin ([place]) 04/09/2019   Rhabdomyolysis 12/10/2018   Sepsis ([place]) 07/28/2018   Cough 03/20/2015   Bronchitis, chronic obstructive w acute bronchitis ([place]) 76/28/3151   Umbilical hernia without obstruction and without gangrene 01/12/2015   Ventral hernia without obstruction or gangrene 01/02/2015   5:15 PM, 11/11/20 Etta Grandchild, PT, DPT Physical Therapist - [place] [facility], [place] 11/11/2020, 5:06 PM  [place] [address] [place], [place], [address] Phone: [phone]   Fax:  [phone]  Name: Daniel Eaton. MRN: [id number] Date of Birth: 16-Nov-1940

## 2020-11-16 ENCOUNTER — Ambulatory Visit: Payer: HMO | Admitting: Physical Therapy

## 2020-11-16 ENCOUNTER — Ambulatory Visit: Payer: HMO

## 2020-11-18 ENCOUNTER — Ambulatory Visit: Payer: HMO

## 2020-11-23 ENCOUNTER — Other Ambulatory Visit: Payer: Self-pay

## 2020-11-23 ENCOUNTER — Ambulatory Visit: Payer: HMO | Attending: Internal Medicine

## 2020-11-23 DIAGNOSIS — R262 Difficulty in walking, not elsewhere classified: Secondary | ICD-10-CM | POA: Insufficient documentation

## 2020-11-23 DIAGNOSIS — M6281 Muscle weakness (generalized): Secondary | ICD-10-CM | POA: Diagnosis not present

## 2020-11-23 DIAGNOSIS — R269 Unspecified abnormalities of gait and mobility: Secondary | ICD-10-CM | POA: Insufficient documentation

## 2020-11-23 DIAGNOSIS — R2681 Unsteadiness on feet: Secondary | ICD-10-CM | POA: Diagnosis not present

## 2020-11-23 NOTE — Therapy (Signed)
[place] MAIN [facility] SERVICES [address] [place], [place], [address] Phone: [phone]   Fax:  [phone]  Physical Therapy Treatment  Patient Details  Name: Zadok Holaway. MRN: [id number] Date of Birth: 09/17/1940 Referring Provider (PT): Dr. [name] MD   Encounter Date: 11/23/2020   PT End of Session - 11/23/20 1659     Visit Number 24    Number of Visits 41    Date for PT Re-Evaluation 12/29/20    Authorization Type [employer]    Authorization Time Period 10/06/20-12/29/20    PT Start Time 1608    PT Stop Time 1643    PT Time Calculation (min) 35 min    Equipment Utilized During Treatment Gait belt    Activity Tolerance Patient limited by fatigue;Patient tolerated treatment well;Patient limited by pain    Behavior During Therapy WFL for tasks assessed/performed             Past Medical History:  Diagnosis Date   A-fib ([place])    Cellulitis    CHF (congestive heart failure) ([place])    COPD (chronic obstructive pulmonary disease) ([place])    Difficult intubation    Hyperlipidemia    Hypertension    Hypothyroidism    Kidney disease    Sleep apnea    Stroke [facility])    Thyroid disease     Past Surgical History:  Procedure Laterality Date   CARDIAC CATHETERIZATION     CATARACT EXTRACTION W/ INTRAOCULAR LENS  IMPLANT, BILATERAL     COLONOSCOPY  2008   HERNIA REPAIR     bilateral inguinal hernia/ [facility]   HERNIA REPAIR  02/02/2015   18 x 28 cm ventral light mesh placed laparoscopically.   TONSILLECTOMY     UMBILICAL HERNIA REPAIR N/A 02/02/2015   Procedure: HERNIA REPAIR UMBILICAL ADULT;  Surgeon: Robert Bellow, MD;  Location: [facility] ORS;  Service: General;  Laterality: N/A;   VENTRAL HERNIA REPAIR N/A 02/02/2015   Procedure: LAPAROSCOPIC VENTRAL HERNIA;  Surgeon: Robert Bellow, MD;  Location: [facility] ORS;  Service: General;  Laterality: N/A;   vp shunt placement  1979    There were no vitals  filed for this visit.   Subjective Assessment - 11/23/20 1652     Subjective Patient reports doing okay today. Reports he is taking his meds as prescribed and states trying to keep up with his exercises and mobility at home. Reports on Tues and Saturdays he is taking his diurectic meds.    Pertinent History Pt is a 80 y.o. male referred to PT for Physical deconditioning. Pt is known to this clinic and has received rehab here in 2019. Pt has a significant PMH of COPD, A-fib, Aortic Atherosclerosis, ischemic cardiomyopathy, benign hypertension with chronic kidney disease, stage III (CMS-HCC); Essential hypertension; Chronic bronchitis, unspecified chronic bronchitis type (CMS-HCC); OSA (obstructive sleep apnea); Morbid obesity with BMI of 40.0-44.9, adult (CMS-HCC) per last cardiology visit. Pt reports being unable to being able to participate in PT during his stay at [facility] this previous October and also tested positive for COVID-19.  Pt's goal is to focus on gait, balance, LE strength, and walking endurance with RW. Was last seen by this therapist on 05/27/20.    Currently in Pain? Yes    Pain Score --   right thigh with ambulation today- did not rate   Pain Location Leg    Pain Orientation Right;Anterior    Pain Descriptors / Indicators Aching;Sore  Pain Type Chronic pain    Pain Onset More than a month ago    Pain Frequency Constant    Aggravating Factors  active movement    Pain Relieving Factors Rest    Effect of Pain on Daily Activities Difficulty with ADLs                 Interventions:   Therapeutic Exercises:   Matrix cable system Resistive strengthening-   Scap retract BUE (using pool noodle positioned behind spine- patient sitting in transport chair) using 12.5 lb 2 sets of 10 reps.  Horizontal shoulder abd (right UE only) - patient attempted on left yet limited by weakness - x 10 reps at 2.5 lb. Shoulder ext  7.5 lb BUE x 10 reps  Hamstring curl at 7.5 lb. BLE  x 10 reps on left and 7 reps on right- VC for correct technique.  Education provided throughout session via VC/TC and demonstration to facilitate movement at target joints and correct muscle activation for all testing and exercises performed.   *Attempted walking at end of session and patient was able to walk approx 6 feet and stated his right leg was sore and deferred this session.   Clinical Impression: Patient was well motivated to attempt newer resistive postural and LE strengthening. He was able to follow all verbal and visual cues and performed well- no report of any increased pain throughout session. He did later report right LE thigh soreness in which he deferred ambulation today and reported being "winded" at end of session. He responded well to resistive therex and eager to attempt all exercises. Patient would benefit from additional skilled PT Intervention to improve strength, balance and mobility                       PT Education - 11/23/20 1658     Education Details Use of matrix cable system and safety with equipment    Person(s) Educated Patient    Methods Explanation;Demonstration;Tactile cues;Verbal cues    Comprehension Verbalized understanding;Returned demonstration;Verbal cues required;Need further instruction              PT Short Term Goals - 08/26/20 1154       PT SHORT TERM GOAL #1   Title Pt will be compliant with given HEP to improve LE muscle strength.    Baseline 6/1: HEP added to 7/13: HEP compliant    Time 6    Period Weeks    Status Achieved    Target Date 08/26/20      PT SHORT TERM GOAL #2   Title Patient will stand from standard height chair on first attempt to allow for improved mobility.     Baseline 6/1: unable to perform 7/13: MET    Time 6    Period Weeks    Status Achieved    Target Date 08/26/20               PT Long Term Goals - 10/28/20 1631       PT LONG TERM GOAL #1   Title Pt will improve FOTO goal to  48 to display perceived improvements in functional mobility.    Baseline 6/1: 40% 7/13: 45% 8/23: 40%,9/14: 45%    Time 12    Period Weeks    Status Partially Met    Target Date 12/29/20      PT LONG TERM GOAL #2   Title Patient (> 60 years old) will complete five times  sit to stand test in < 1 minute indicating an increased LE strength and improved balance.    Baseline 6/1: 1 min 31 seconds with heavy BUE support 7/13: 49 seconds with heavy BUE support    Time 12    Period Weeks    Status Achieved      PT LONG TERM GOAL #3   Title Patient will increase BLE gross strength to 4+/5 as to improve functional strength for independent gait, increased standing tolerance and increased ADL ability.    Baseline 6/1: see note 7/13: see note 8/23: grossly 4/5 bilaterally with hip extension 3+/5    Time 12    Period Weeks    Status Partially Met      PT LONG TERM GOAL #4   Title Patient will increase 10 meter walk test to >1.0 m/s as to improve gait speed for better community ambulation and to reduce fall risk.    Baseline 6/1: 0.41 m/s with RW 7/13: 0.6 m/s with walker 8/23: 0.6 m/s with walker 9/14: 0.61 m/s with RW    Time 12    Period Weeks    Status On-going    Target Date 12/29/20      PT LONG TERM GOAL #5   Title Patient will stand without UE support >3 minutes for ADL performance and increased stability with independent movement.    Baseline 6/1: 1 min 7 seconds 7/13: 3 min 3 seconds    Time 12    Period Weeks    Status Achieved      PT LONG TERM GOAL #6   Title Patient will increase Berg Balance score by > 6 points (29/56 )  to demonstrate decreased fall risk during functional activities.    Baseline 8/23: 23/56    Time 12    Period Weeks    Status New      PT LONG TERM GOAL #7   Title Patient will ambulate >300 ft during 2 minute walk test with RW for improved capacity for functional mobility.    Baseline 8/23: 88 ft with frequent standing breaks; 9/14: 170ft    Time 12     Period Weeks    Status Partially Met    Target Date 12/29/20                   Plan - 11/23/20 1659     Clinical Impression Statement Patient was well motivated to attempt newer resistive postural and LE strengthening. He was able to follow all verbal and visual cues and performed well- no report of any increased pain throughout session. He did later report right LE thigh soreness in which he deferred ambulation today and reported being "winded" at end of session. He responded well to resistive therex and eager to attempt all exercises. Patient would benefit from additional skilled PT Intervention to improve strength, balance and mobility    Personal Factors and Comorbidities Age;Comorbidity 3+;Past/Current Experience;Fitness    Comorbidities OPD, A-fib, Aortic Atherosclerosis, ischemic cardiomyopathy, Benign hypertension with chronic kidney disease, stage III (CMS-HCC); Essential hypertension; Chronic bronchitis, unspecified chronic bronchitis type (CMS-HCC); OSA (obstructive sleep apnea); Morbid obesity with BMI of 40.0-44.9, adult (CMS-HCC)    Examination-Activity Limitations Bend;Carry;Squat;Continence;Stand;Transfers;Locomotion Level;Reach Overhead;Bed Mobility;Lift;Stairs;Toileting;Dressing    Examination-Participation Restrictions Community Activity;Laundry;Yard Work;Cleaning;Driving;Meal Prep;Shop    Stability/Clinical Decision Making Evolving/Moderate complexity    Rehab Potential Fair    Clinical Impairments Affecting Rehab Potential Cardiorespiratory limitations    PT Frequency 2x / week    PT Duration 12  weeks    PT Treatment/Interventions ADLs/Self Care Home Management;Moist Heat;Cryotherapy;Electrical Stimulation;Functional mobility training;Therapeutic activities;Therapeutic exercise;DME Instruction;Gait training;Balance training;Neuromuscular re-education;Patient/family education;Energy conservation;Biofeedback;Ultrasound;Stair training;Orthotic Fit/Training;Manual  techniques;Manual lymph drainage;Dry needling;Passive range of motion;Taping;Vestibular;Visual/perceptual remediation/compensation    PT Next Visit Plan Continue with actiivty tolerance, standing tolerance, postural strengthening, overground AMB    PT Home Exercise Plan No updates today    Consulted and Agree with Plan of Care Patient             Patient will benefit from skilled therapeutic intervention in order to improve the following deficits and impairments:  Abnormal gait, Difficulty walking, Cardiopulmonary status limiting activity, Decreased endurance, Impaired UE functional use, Obesity, Decreased activity tolerance, Decreased balance, Improper body mechanics, Decreased mobility, Decreased strength, Impaired sensation, Postural dysfunction, Increased edema, Impaired flexibility, Decreased range of motion, Decreased coordination, Pain  Visit Diagnosis: Abnormality of gait and mobility  Difficulty in walking, not elsewhere classified  Muscle weakness (generalized)  Unsteadiness on feet     Problem List Patient Active Problem List   Diagnosis Date Noted   Atrial fibrillation ([place]) 04/09/2019   Essential hypertension 04/09/2019   Venous stasis dermatitis of both lower extremities 04/09/2019   Lower limb ulcer, ankle, right, limited to breakdown of skin ([place]) 04/09/2019   Rhabdomyolysis 12/10/2018   Sepsis ([place]) 07/28/2018   Cough 03/20/2015   Bronchitis, chronic obstructive w acute bronchitis ([place]) 09/70/4492   Umbilical hernia without obstruction and without gangrene 01/12/2015   Ventral hernia without obstruction or gangrene 01/02/2015    Lewis Moccasin, PT 11/23/2020, 5:11 PM  [place] [address] [place], [place], [address] Phone: [phone]   Fax:  [phone]  Name: Hank Walling. MRN: [id number] Date of Birth: 1940/05/23

## 2020-11-25 ENCOUNTER — Other Ambulatory Visit: Payer: Self-pay

## 2020-11-25 ENCOUNTER — Ambulatory Visit: Payer: HMO

## 2020-11-25 DIAGNOSIS — R262 Difficulty in walking, not elsewhere classified: Secondary | ICD-10-CM

## 2020-11-25 DIAGNOSIS — R269 Unspecified abnormalities of gait and mobility: Secondary | ICD-10-CM

## 2020-11-25 DIAGNOSIS — M6281 Muscle weakness (generalized): Secondary | ICD-10-CM

## 2020-11-25 NOTE — Therapy (Signed)
[place] MAIN [facility] SERVICES [address] [place], [place], [address] Phone: [phone]   Fax:  [phone]  Physical Therapy Treatment  Patient Details  Name: Daniel Eaton. MRN: [id number] Date of Birth: 01-25-1941 Referring Provider (PT): Dr. [name] MD   Encounter Date: 11/25/2020   PT End of Session - 11/25/20 1612     Visit Number 25    Number of Visits 41    Date for PT Re-Evaluation 12/29/20    Authorization Type [employer]    Authorization Time Period 10/06/20-12/29/20    PT Start Time 1605    PT Stop Time 1645    PT Time Calculation (min) 40 min    Equipment Utilized During Treatment Gait belt    Activity Tolerance Patient limited by fatigue;Patient tolerated treatment well;Patient limited by pain    Behavior During Therapy WFL for tasks assessed/performed             Past Medical History:  Diagnosis Date   A-fib ([place])    Cellulitis    CHF (congestive heart failure) ([place])    COPD (chronic obstructive pulmonary disease) ([place])    Difficult intubation    Hyperlipidemia    Hypertension    Hypothyroidism    Kidney disease    Sleep apnea    Stroke [facility])    Thyroid disease     Past Surgical History:  Procedure Laterality Date   CARDIAC CATHETERIZATION     CATARACT EXTRACTION W/ INTRAOCULAR LENS  IMPLANT, BILATERAL     COLONOSCOPY  2008   HERNIA REPAIR     bilateral inguinal hernia/ [facility]   HERNIA REPAIR  02/02/2015   18 x 28 cm ventral light mesh placed laparoscopically.   TONSILLECTOMY     UMBILICAL HERNIA REPAIR N/A 02/02/2015   Procedure: HERNIA REPAIR UMBILICAL ADULT;  Surgeon: Robert Bellow, MD;  Location: [facility] ORS;  Service: General;  Laterality: N/A;   VENTRAL HERNIA REPAIR N/A 02/02/2015   Procedure: LAPAROSCOPIC VENTRAL HERNIA;  Surgeon: Robert Bellow, MD;  Location: [facility] ORS;  Service: General;  Laterality: N/A;   vp shunt placement  1979    There were no vitals  filed for this visit.   Subjective Assessment - 11/25/20 1611     Subjective Patient reports his shoulders and back are sore but he enjoyed last session. No falls or LOB since last session. Compliant with HEP.    Pertinent History Pt is a 80 y.o. male referred to PT for Physical deconditioning. Pt is known to this clinic and has received rehab here in 2019. Pt has a significant PMH of COPD, A-fib, Aortic Atherosclerosis, ischemic cardiomyopathy, benign hypertension with chronic kidney disease, stage III (CMS-HCC); Essential hypertension; Chronic bronchitis, unspecified chronic bronchitis type (CMS-HCC); OSA (obstructive sleep apnea); Morbid obesity with BMI of 40.0-44.9, adult (CMS-HCC) per last cardiology visit. Pt reports being unable to being able to participate in PT during his stay at [facility] this previous October and also tested positive for COVID-19.  Pt's goal is to focus on gait, balance, LE strength, and walking endurance with RW. Was last seen by this therapist on 05/27/20.    Limitations Standing;House hold activities;Walking;Lifting    How long can you sit comfortably? hour    How long can you stand comfortably? not sure    How long can you walk comfortably? walk with RW to bathroom and back    Patient Stated Goals Improve gait, balance, and LE strength.  Reduce risk of falls.    Currently in Pain? No/denies    Pain Onset More than a month ago                  TREATMENT:    Nustep Lvl 3-5 ; seat position 13; arms 11; SPM>30 for cardiovascular and musculoskeletal challenge 4 minutes; Lvel 8 for 15 seconds   Standing in parallel bars: -hip flexion march x10 reps into GTB across // bars  bilaterally, cues to slow down LE movement and increase ROM for better strengthening -Heel raises x10 reps with cues to reduce UE weight bearing to challenge stance control, CGA for safety;  -lateral stepping 4x length of // bars. Close CGA -forward/backwards walking 4x length of //  bars, cues for increased step length; no UE support required for walking forward   Seated: Seated scapular retractions intermittently when seated between standing interventions for increased breathing and postural correction   Rainbow ball between feet: straight LAQ with adduction 10x Alternating toe taps for coordination and sequencing 2x 30 seconds; improved second trial.   Vitals monitored throughout session. Patient tolerated session well.  Denies any increase in pain;       Pt educated throughout session about proper posture and technique with exercises. Improved exercise technique, movement at target joints, use of target muscles after min to mod verbal, visual, tactile cues      Patient is highly motivated throughout physical therapy session and is eager to progress resistance and strengthening interventions. He tolerates progressive resistance on Nustep well with fatigue but no pain. Occasional Min A for sequencing required throughout session. Coordination of LE's is limited initially but improves with repetition/familiarity of task. Patient would benefit from additional skilled PT Intervention to improve strength, balance and mobility                PT Education - 11/25/20 1612     Education provided Yes    Education Details exercise technique, body mechanics    Person(s) Educated Patient    Methods Explanation;Demonstration;Tactile cues;Verbal cues    Comprehension Verbalized understanding;Returned demonstration;Verbal cues required;Tactile cues required              PT Short Term Goals - 08/26/20 1154       PT SHORT TERM GOAL #1   Title Pt will be compliant with given HEP to improve LE muscle strength.    Baseline 6/1: HEP added to 7/13: HEP compliant    Time 6    Period Weeks    Status Achieved    Target Date 08/26/20      PT SHORT TERM GOAL #2   Title Patient will stand from standard height chair on first attempt to allow for improved mobility.      Baseline 6/1: unable to perform 7/13: MET    Time 6    Period Weeks    Status Achieved    Target Date 08/26/20               PT Long Term Goals - 10/28/20 1631       PT LONG TERM GOAL #1   Title Pt will improve FOTO goal to 48 to display perceived improvements in functional mobility.    Baseline 6/1: 40% 7/13: 45% 8/23: 40%,9/14: 45%    Time 12    Period Weeks    Status Partially Met    Target Date 12/29/20      PT LONG TERM GOAL #2   Title Patient (>  51 years old) will complete five times sit to stand test in < 1 minute indicating an increased LE strength and improved balance.    Baseline 6/1: 1 min 31 seconds with heavy BUE support 7/13: 49 seconds with heavy BUE support    Time 12    Period Weeks    Status Achieved      PT LONG TERM GOAL #3   Title Patient will increase BLE gross strength to 4+/5 as to improve functional strength for independent gait, increased standing tolerance and increased ADL ability.    Baseline 6/1: see note 7/13: see note 8/23: grossly 4/5 bilaterally with hip extension 3+/5    Time 12    Period Weeks    Status Partially Met      PT LONG TERM GOAL #4   Title Patient will increase 10 meter walk test to >1.0 m/s as to improve gait speed for better community ambulation and to reduce fall risk.    Baseline 6/1: 0.41 m/s with RW 7/13: 0.6 m/s with walker 8/23: 0.6 m/s with walker 9/14: 0.61 m/s with RW    Time 12    Period Weeks    Status On-going    Target Date 12/29/20      PT LONG TERM GOAL #5   Title Patient will stand without UE support >3 minutes for ADL performance and increased stability with independent movement.    Baseline 6/1: 1 min 7 seconds 7/13: 3 min 3 seconds    Time 12    Period Weeks    Status Achieved      PT LONG TERM GOAL #6   Title Patient will increase Berg Balance score by > 6 points (29/56 )  to demonstrate decreased fall risk during functional activities.    Baseline 8/23: 23/56    Time 12    Period Weeks     Status New      PT LONG TERM GOAL #7   Title Patient will ambulate >300 ft during 2 minute walk test with RW for improved capacity for functional mobility.    Baseline 8/23: 88 ft with frequent standing breaks; 9/14: 1100f    Time 12    Period Weeks    Status Partially Met    Target Date 12/29/20                   Plan - 11/26/20 0729     Clinical Impression Statement Patient is highly motivated throughout physical therapy session and is eager to progress resistance and strengthening interventions. He tolerates progressive resistance on Nustep well with fatigue but no pain. Occasional Min A for sequencing required throughout session. Coordination of LE's is limited initially but improves with repetition/familiarity of task. Patient would benefit from additional skilled PT Intervention to improve strength, balance and mobility    Personal Factors and Comorbidities Age;Comorbidity 3+;Past/Current Experience;Fitness    Comorbidities OPD, A-fib, Aortic Atherosclerosis, ischemic cardiomyopathy, Benign hypertension with chronic kidney disease, stage III (CMS-HCC); Essential hypertension; Chronic bronchitis, unspecified chronic bronchitis type (CMS-HCC); OSA (obstructive sleep apnea); Morbid obesity with BMI of 40.0-44.9, adult (CMS-HCC)    Examination-Activity Limitations Bend;Carry;Squat;Continence;Stand;Transfers;Locomotion Level;Reach Overhead;Bed Mobility;Lift;Stairs;Toileting;Dressing    Examination-Participation Restrictions Community Activity;Laundry;Yard Work;Cleaning;Driving;Meal Prep;Shop    Stability/Clinical Decision Making Evolving/Moderate complexity    Rehab Potential Fair    Clinical Impairments Affecting Rehab Potential Cardiorespiratory limitations    PT Frequency 2x / week    PT Duration 12 weeks    PT Treatment/Interventions ADLs/Self Care Home Management;Moist Heat;Cryotherapy;EDealer  Stimulation;Functional mobility training;Therapeutic activities;Therapeutic  exercise;DME Instruction;Gait training;Balance training;Neuromuscular re-education;Patient/family education;Energy conservation;Biofeedback;Ultrasound;Stair training;Orthotic Fit/Training;Manual techniques;Manual lymph drainage;Dry needling;Passive range of motion;Taping;Vestibular;Visual/perceptual remediation/compensation    PT Next Visit Plan Continue with actiivty tolerance, standing tolerance, postural strengthening, overground AMB    PT Home Exercise Plan No updates today    Consulted and Agree with Plan of Care Patient             Patient will benefit from skilled therapeutic intervention in order to improve the following deficits and impairments:  Abnormal gait, Difficulty walking, Cardiopulmonary status limiting activity, Decreased endurance, Impaired UE functional use, Obesity, Decreased activity tolerance, Decreased balance, Improper body mechanics, Decreased mobility, Decreased strength, Impaired sensation, Postural dysfunction, Increased edema, Impaired flexibility, Decreased range of motion, Decreased coordination, Pain  Visit Diagnosis: Abnormality of gait and mobility  Difficulty in walking, not elsewhere classified  Muscle weakness (generalized)     Problem List Patient Active Problem List   Diagnosis Date Noted   Atrial fibrillation ([place]) 04/09/2019   Essential hypertension 04/09/2019   Venous stasis dermatitis of both lower extremities 04/09/2019   Lower limb ulcer, ankle, right, limited to breakdown of skin ([place]) 04/09/2019   Rhabdomyolysis 12/10/2018   Sepsis ([place]) 07/28/2018   Cough 03/20/2015   Bronchitis, chronic obstructive w acute bronchitis ([facility]) 84/04/3531   Umbilical hernia without obstruction and without gangrene 01/12/2015   Ventral hernia without obstruction or gangrene 01/02/2015    Janna Arch, PT, DPT  11/26/2020, 7:29 AM  [place] [address] [place], [place], [address] Phone:  [phone]   Fax:  [phone]  Name: Terik Haughey. MRN: [id number] Date of Birth: [birth year]

## 2020-11-30 ENCOUNTER — Ambulatory Visit: Payer: HMO

## 2020-11-30 ENCOUNTER — Other Ambulatory Visit: Payer: Self-pay

## 2020-11-30 DIAGNOSIS — M6281 Muscle weakness (generalized): Secondary | ICD-10-CM

## 2020-11-30 DIAGNOSIS — R269 Unspecified abnormalities of gait and mobility: Secondary | ICD-10-CM

## 2020-11-30 DIAGNOSIS — R2681 Unsteadiness on feet: Secondary | ICD-10-CM

## 2020-11-30 DIAGNOSIS — R262 Difficulty in walking, not elsewhere classified: Secondary | ICD-10-CM

## 2020-11-30 NOTE — Therapy (Signed)
[place] MAIN [facility] SERVICES [address] [place], [place], [address] Phone: [phone]   Fax:  [phone]  Physical Therapy Treatment  Patient Details  Name: Daniel Eaton. MRN: [id number] Date of Birth: Jul 30, 1940 Referring Provider (PT): Dr. [name] MD   Encounter Date: 11/30/2020   PT End of Session - 11/30/20 1619     Visit Number 26    Number of Visits 41    Date for PT Re-Evaluation 12/29/20    Authorization Type [employer]    Authorization Time Period 10/06/20-12/29/20    PT Start Time 1607    PT Stop Time 1645    PT Time Calculation (min) 38 min    Equipment Utilized During Treatment Gait belt    Activity Tolerance Patient limited by fatigue;Patient tolerated treatment well;Patient limited by pain    Behavior During Therapy WFL for tasks assessed/performed             Past Medical History:  Diagnosis Date   A-fib ([place])    Cellulitis    CHF (congestive heart failure) ([place])    COPD (chronic obstructive pulmonary disease) ([place])    Difficult intubation    Hyperlipidemia    Hypertension    Hypothyroidism    Kidney disease    Sleep apnea    Stroke [facility])    Thyroid disease     Past Surgical History:  Procedure Laterality Date   CARDIAC CATHETERIZATION     CATARACT EXTRACTION W/ INTRAOCULAR LENS  IMPLANT, BILATERAL     COLONOSCOPY  2008   HERNIA REPAIR     bilateral inguinal hernia/ [facility]   HERNIA REPAIR  02/02/2015   18 x 28 cm ventral light mesh placed laparoscopically.   TONSILLECTOMY     UMBILICAL HERNIA REPAIR N/A 02/02/2015   Procedure: HERNIA REPAIR UMBILICAL ADULT;  Surgeon: Robert Bellow, MD;  Location: [facility] ORS;  Service: General;  Laterality: N/A;   VENTRAL HERNIA REPAIR N/A 02/02/2015   Procedure: LAPAROSCOPIC VENTRAL HERNIA;  Surgeon: Robert Bellow, MD;  Location: [facility] ORS;  Service: General;  Laterality: N/A;   vp shunt placement  1979    There were no vitals  filed for this visit.   Subjective Assessment - 11/30/20 1613     Subjective Patient reports knee pain is affecting him, worsens in standing. Arrived late limiting session duration.    Pertinent History Pt is a 80 y.o. male referred to PT for Physical deconditioning. Pt is known to this clinic and has received rehab here in 2019. Pt has a significant PMH of COPD, A-fib, Aortic Atherosclerosis, ischemic cardiomyopathy, benign hypertension with chronic kidney disease, stage III (CMS-HCC); Essential hypertension; Chronic bronchitis, unspecified chronic bronchitis type (CMS-HCC); OSA (obstructive sleep apnea); Morbid obesity with BMI of 40.0-44.9, adult (CMS-HCC) per last cardiology visit. Pt reports being unable to being able to participate in PT during his stay at [facility] this previous October and also tested positive for COVID-19.  Pt's goal is to focus on gait, balance, LE strength, and walking endurance with RW. Was last seen by this therapist on 05/27/20.    Limitations Standing;House hold activities;Walking;Lifting    How long can you sit comfortably? hour    How long can you stand comfortably? not sure    How long can you walk comfortably? walk with RW to bathroom and back    Patient Stated Goals Improve gait, balance, and LE strength. Reduce risk of falls.    Currently  in Pain? Yes    Pain Score 6     Pain Location Knee    Pain Orientation Right;Left    Pain Descriptors / Indicators Aching    Pain Type Chronic pain    Pain Onset More than a month ago    Pain Frequency Constant    Aggravating Factors  weightbearing                 Standing in parallel bars: -High knee march 1x length of // bars with BUE support -step over half foam roller and back; 10x each LE, heavy BUE support -lateral step over half foam roller and back 10x each side; BUE support    Seated: Scap retract/row BUE using 7.5 lb 10 reps. With rope attachment; 2nd set with 12.5 lb on matrix machine ; 5  reps with 17.5 lb on 3rd attempt  Tricep extension on cable attachment of matrix machine #7.5; 10x each UE; single arm at a time  Seated scapular retractions intermittently when seated between standing interventions for increased breathing and postural correction    Vitals monitored throughout session.     Pt educated throughout session about proper posture and technique with exercises. Improved exercise technique, movement at target joints, use of target muscles after min to mod verbal, visual, tactile cues    Patient arrived late to PT session limiting full session duration. He is highly motivated despite late arrival. Continued focus on increasing capacity of standing and strengthening tolerated well with seated rest breaks required between each standing intervention. Seated interventions performed for final portion of session due to fatigue. Patient would benefit from additional skilled PT Intervention to improve strength, balance and mobility                     PT Education - 11/30/20 1619     Education provided Yes    Education Details exercise technique, body mechanics    Person(s) Educated Patient    Methods Explanation;Demonstration;Tactile cues;Verbal cues    Comprehension Verbalized understanding;Returned demonstration;Verbal cues required;Tactile cues required              PT Short Term Goals - 08/26/20 1154       PT SHORT TERM GOAL #1   Title Pt will be compliant with given HEP to improve LE muscle strength.    Baseline 6/1: HEP added to 7/13: HEP compliant    Time 6    Period Weeks    Status Achieved    Target Date 08/26/20      PT SHORT TERM GOAL #2   Title Patient will stand from standard height chair on first attempt to allow for improved mobility.     Baseline 6/1: unable to perform 7/13: MET    Time 6    Period Weeks    Status Achieved    Target Date 08/26/20               PT Long Term Goals - 10/28/20 1631       PT LONG  TERM GOAL #1   Title Pt will improve FOTO goal to 48 to display perceived improvements in functional mobility.    Baseline 6/1: 40% 7/13: 45% 8/23: 40%,9/14: 45%    Time 12    Period Weeks    Status Partially Met    Target Date 12/29/20      PT LONG TERM GOAL #2   Title Patient (> 65 years old) will complete five times sit to  stand test in < 1 minute indicating an increased LE strength and improved balance.    Baseline 6/1: 1 min 31 seconds with heavy BUE support 7/13: 49 seconds with heavy BUE support    Time 12    Period Weeks    Status Achieved      PT LONG TERM GOAL #3   Title Patient will increase BLE gross strength to 4+/5 as to improve functional strength for independent gait, increased standing tolerance and increased ADL ability.    Baseline 6/1: see note 7/13: see note 8/23: grossly 4/5 bilaterally with hip extension 3+/5    Time 12    Period Weeks    Status Partially Met      PT LONG TERM GOAL #4   Title Patient will increase 10 meter walk test to >1.0 m/s as to improve gait speed for better community ambulation and to reduce fall risk.    Baseline 6/1: 0.41 m/s with RW 7/13: 0.6 m/s with walker 8/23: 0.6 m/s with walker 9/14: 0.61 m/s with RW    Time 12    Period Weeks    Status On-going    Target Date 12/29/20      PT LONG TERM GOAL #5   Title Patient will stand without UE support >3 minutes for ADL performance and increased stability with independent movement.    Baseline 6/1: 1 min 7 seconds 7/13: 3 min 3 seconds    Time 12    Period Weeks    Status Achieved      PT LONG TERM GOAL #6   Title Patient will increase Berg Balance score by > 6 points (29/56 )  to demonstrate decreased fall risk during functional activities.    Baseline 8/23: 23/56    Time 12    Period Weeks    Status New      PT LONG TERM GOAL #7   Title Patient will ambulate >300 ft during 2 minute walk test with RW for improved capacity for functional mobility.    Baseline 8/23: 88 ft with  frequent standing breaks; 9/14: 123f    Time 12    Period Weeks    Status Partially Met    Target Date 12/29/20                   Plan - 11/30/20 1641     Clinical Impression Statement Patient arrived late to PT session limiting full session duration. He is highly motivated despite late arrival. Continued focus on increasing capacity of standing and strengthening tolerated well with seated rest breaks required between each standing intervention. Seated interventions performed for final portion of session due to fatigue. Patient would benefit from additional skilled PT Intervention to improve strength, balance and mobility    Personal Factors and Comorbidities Age;Comorbidity 3+;Past/Current Experience;Fitness    Comorbidities OPD, A-fib, Aortic Atherosclerosis, ischemic cardiomyopathy, Benign hypertension with chronic kidney disease, stage III (CMS-HCC); Essential hypertension; Chronic bronchitis, unspecified chronic bronchitis type (CMS-HCC); OSA (obstructive sleep apnea); Morbid obesity with BMI of 40.0-44.9, adult (CMS-HCC)    Examination-Activity Limitations Bend;Carry;Squat;Continence;Stand;Transfers;Locomotion Level;Reach Overhead;Bed Mobility;Lift;Stairs;Toileting;Dressing    Examination-Participation Restrictions Community Activity;Laundry;Yard Work;Cleaning;Driving;Meal Prep;Shop    Stability/Clinical Decision Making Evolving/Moderate complexity    Rehab Potential Fair    Clinical Impairments Affecting Rehab Potential Cardiorespiratory limitations    PT Frequency 2x / week    PT Duration 12 weeks    PT Treatment/Interventions ADLs/Self Care Home Management;Moist Heat;Cryotherapy;Electrical Stimulation;Functional mobility training;Therapeutic activities;Therapeutic exercise;DME Instruction;Gait training;Balance training;Neuromuscular re-education;Patient/family education;Energy  conservation;Biofeedback;Ultrasound;Stair training;Orthotic Fit/Training;Manual techniques;Manual  lymph drainage;Dry needling;Passive range of motion;Taping;Vestibular;Visual/perceptual remediation/compensation    PT Next Visit Plan Continue with actiivty tolerance, standing tolerance, postural strengthening, overground AMB    PT Home Exercise Plan No updates today    Consulted and Agree with Plan of Care Patient             Patient will benefit from skilled therapeutic intervention in order to improve the following deficits and impairments:  Abnormal gait, Difficulty walking, Cardiopulmonary status limiting activity, Decreased endurance, Impaired UE functional use, Obesity, Decreased activity tolerance, Decreased balance, Improper body mechanics, Decreased mobility, Decreased strength, Impaired sensation, Postural dysfunction, Increased edema, Impaired flexibility, Decreased range of motion, Decreased coordination, Pain  Visit Diagnosis: Abnormality of gait and mobility  Difficulty in walking, not elsewhere classified  Muscle weakness (generalized)  Unsteadiness on feet     Problem List Patient Active Problem List   Diagnosis Date Noted   Atrial fibrillation ([place]) 04/09/2019   Essential hypertension 04/09/2019   Venous stasis dermatitis of both lower extremities 04/09/2019   Lower limb ulcer, ankle, right, limited to breakdown of skin ([place]) 04/09/2019   Rhabdomyolysis 12/10/2018   Sepsis ([place]) 07/28/2018   Cough 03/20/2015   Bronchitis, chronic obstructive w acute bronchitis ([place]) 93/79/0240   Umbilical hernia without obstruction and without gangrene 01/12/2015   Ventral hernia without obstruction or gangrene 01/02/2015    Janna Arch, PT, DPT  11/30/2020, 4:46 PM  [facility] MAIN [facility] SERVICES [address] [place], [place], [address] Phone: [phone]   Fax:  [phone]  Name: Daniel Eaton. MRN: [id number] Date of Birth: January 25, 1941

## 2020-12-02 ENCOUNTER — Other Ambulatory Visit: Payer: Self-pay

## 2020-12-02 ENCOUNTER — Ambulatory Visit: Payer: HMO

## 2020-12-02 DIAGNOSIS — R269 Unspecified abnormalities of gait and mobility: Secondary | ICD-10-CM | POA: Diagnosis not present

## 2020-12-02 DIAGNOSIS — M6281 Muscle weakness (generalized): Secondary | ICD-10-CM

## 2020-12-02 DIAGNOSIS — R2681 Unsteadiness on feet: Secondary | ICD-10-CM

## 2020-12-02 NOTE — Therapy (Signed)
[place] MAIN [facility] SERVICES [address] [place], [place], [address] Phone: [phone]   Fax:  [phone]  Physical Therapy Treatment  Patient Details  Name: Daniel Eaton. MRN: [id number] Date of Birth: 05-23-1940 Referring Provider (PT): Dr. [name] MD   Encounter Date: 12/02/2020   PT End of Session - 12/02/20 1634     Visit Number 27    Number of Visits 41    Date for PT Re-Evaluation 12/29/20    Authorization Type [employer]    Authorization Time Period 10/06/20-12/29/20    PT Start Time 1602    PT Stop Time 1645    PT Time Calculation (min) 43 min    Equipment Utilized During Treatment Gait belt    Activity Tolerance Patient limited by fatigue;Patient tolerated treatment well;Patient limited by pain    Behavior During Therapy WFL for tasks assessed/performed             Past Medical History:  Diagnosis Date   A-fib ([place])    Cellulitis    CHF (congestive heart failure) ([place])    COPD (chronic obstructive pulmonary disease) ([place])    Difficult intubation    Hyperlipidemia    Hypertension    Hypothyroidism    Kidney disease    Sleep apnea    Stroke [facility])    Thyroid disease     Past Surgical History:  Procedure Laterality Date   CARDIAC CATHETERIZATION     CATARACT EXTRACTION W/ INTRAOCULAR LENS  IMPLANT, BILATERAL     COLONOSCOPY  2008   HERNIA REPAIR     bilateral inguinal hernia/ [facility]   HERNIA REPAIR  02/02/2015   18 x 28 cm ventral light mesh placed laparoscopically.   TONSILLECTOMY     UMBILICAL HERNIA REPAIR N/A 02/02/2015   Procedure: HERNIA REPAIR UMBILICAL ADULT;  Surgeon: Robert Bellow, MD;  Location: [facility] ORS;  Service: General;  Laterality: N/A;   VENTRAL HERNIA REPAIR N/A 02/02/2015   Procedure: LAPAROSCOPIC VENTRAL HERNIA;  Surgeon: Robert Bellow, MD;  Location: [facility] ORS;  Service: General;  Laterality: N/A;   vp shunt placement  1979    There were no vitals  filed for this visit.   Subjective Assessment - 12/02/20 1633     Subjective Patient presents to physical therapy in good spirits. No falls or LOB since last session. Has a wound on his L leg due to a skin tear getting out of his car.    Pertinent History Pt is a 80 y.o. male referred to PT for Physical deconditioning. Pt is known to this clinic and has received rehab here in 2019. Pt has a significant PMH of COPD, A-fib, Aortic Atherosclerosis, ischemic cardiomyopathy, benign hypertension with chronic kidney disease, stage III (CMS-HCC); Essential hypertension; Chronic bronchitis, unspecified chronic bronchitis type (CMS-HCC); OSA (obstructive sleep apnea); Morbid obesity with BMI of 40.0-44.9, adult (CMS-HCC) per last cardiology visit. Pt reports being unable to being able to participate in PT during his stay at [facility] this previous October and also tested positive for COVID-19.  Pt's goal is to focus on gait, balance, LE strength, and walking endurance with RW. Was last seen by this therapist on 05/27/20.    Limitations Standing;House hold activities;Walking;Lifting    How long can you sit comfortably? hour    How long can you stand comfortably? not sure    How long can you walk comfortably? walk with RW to bathroom and back  Patient Stated Goals Improve gait, balance, and LE strength. Reduce risk of falls.    Currently in Pain? No/denies               Treatment:  Ambulate 96 ft with RW and CGA ; one standing rest break; increased trunk flexion noted with increased reliance upon Ue's with fatigue.   6" step toe taps BUE support 12x each LE  6" step lateral toe taps BUE support; 15x each LE  hip extension 15x each LE BUE support   ambulate one length of // bars with UE raises with close CGA   Seated:  5x sit to stand 2 sets ; HR elevation to 125 with first set requiring seated rest break to return HR to therapuetic range before attempting second round of sit to stands LAQ  10x each LE  Scapular retraction with purse lipped breathing for    Patient requires assistance wrapping L ankle due to skin flap bleed. Wrapped with prewrap due to limited skin integrity.     Patient presents with excellent motivation throughout session. He tolerates ambulation well today with reduced shuffling of steps. Sit to stands continue to be an area of focus at this time as they are challenging and fatiguing to patient. Patient is able to return to seated position with decreased episodes of "flopping" indicating improved eccentric control. Patient would benefit from additional skilled PT Intervention to improve strength, balance and mobility          PT Education - 12/02/20 1634     Education provided Yes    Education Details exercise technique, body mechanics    Person(s) Educated Patient    Methods Explanation;Demonstration;Tactile cues;Verbal cues    Comprehension Verbalized understanding;Returned demonstration;Verbal cues required;Tactile cues required              PT Short Term Goals - 08/26/20 1154       PT SHORT TERM GOAL #1   Title Pt will be compliant with given HEP to improve LE muscle strength.    Baseline 6/1: HEP added to 7/13: HEP compliant    Time 6    Period Weeks    Status Achieved    Target Date 08/26/20      PT SHORT TERM GOAL #2   Title Patient will stand from standard height chair on first attempt to allow for improved mobility.     Baseline 6/1: unable to perform 7/13: MET    Time 6    Period Weeks    Status Achieved    Target Date 08/26/20               PT Long Term Goals - 10/28/20 1631       PT LONG TERM GOAL #1   Title Pt will improve FOTO goal to 48 to display perceived improvements in functional mobility.    Baseline 6/1: 40% 7/13: 45% 8/23: 40%,9/14: 45%    Time 12    Period Weeks    Status Partially Met    Target Date 12/29/20      PT LONG TERM GOAL #2   Title Patient (> 29 years old) will complete five times  sit to stand test in < 1 minute indicating an increased LE strength and improved balance.    Baseline 6/1: 1 min 31 seconds with heavy BUE support 7/13: 49 seconds with heavy BUE support    Time 12    Period Weeks    Status Achieved  PT LONG TERM GOAL #3   Title Patient will increase BLE gross strength to 4+/5 as to improve functional strength for independent gait, increased standing tolerance and increased ADL ability.    Baseline 6/1: see note 7/13: see note 8/23: grossly 4/5 bilaterally with hip extension 3+/5    Time 12    Period Weeks    Status Partially Met      PT LONG TERM GOAL #4   Title Patient will increase 10 meter walk test to >1.0 m/s as to improve gait speed for better community ambulation and to reduce fall risk.    Baseline 6/1: 0.41 m/s with RW 7/13: 0.6 m/s with walker 8/23: 0.6 m/s with walker 9/14: 0.61 m/s with RW    Time 12    Period Weeks    Status On-going    Target Date 12/29/20      PT LONG TERM GOAL #5   Title Patient will stand without UE support >3 minutes for ADL performance and increased stability with independent movement.    Baseline 6/1: 1 min 7 seconds 7/13: 3 min 3 seconds    Time 12    Period Weeks    Status Achieved      PT LONG TERM GOAL #6   Title Patient will increase Berg Balance score by > 6 points (29/56 )  to demonstrate decreased fall risk during functional activities.    Baseline 8/23: 23/56    Time 12    Period Weeks    Status New      PT LONG TERM GOAL #7   Title Patient will ambulate >300 ft during 2 minute walk test with RW for improved capacity for functional mobility.    Baseline 8/23: 88 ft with frequent standing breaks; 9/14: 171f    Time 12    Period Weeks    Status Partially Met    Target Date 12/29/20                   Plan - 12/02/20 1650     Clinical Impression Statement Patient presents with excellent motivation throughout session. He tolerates ambulation well today with reduced shuffling of  steps. Sit to stands continue to be an area of focus at this time as they are challenging and fatiguing to patient. Patient is able to return to seated position with decreased episodes of "flopping" indicating improved eccentric control. Patient would benefit from additional skilled PT Intervention to improve strength, balance and mobility    Personal Factors and Comorbidities Age;Comorbidity 3+;Past/Current Experience;Fitness    Comorbidities OPD, A-fib, Aortic Atherosclerosis, ischemic cardiomyopathy, Benign hypertension with chronic kidney disease, stage III (CMS-HCC); Essential hypertension; Chronic bronchitis, unspecified chronic bronchitis type (CMS-HCC); OSA (obstructive sleep apnea); Morbid obesity with BMI of 40.0-44.9, adult (CMS-HCC)    Examination-Activity Limitations Bend;Carry;Squat;Continence;Stand;Transfers;Locomotion Level;Reach Overhead;Bed Mobility;Lift;Stairs;Toileting;Dressing    Examination-Participation Restrictions Community Activity;Laundry;Yard Work;Cleaning;Driving;Meal Prep;Shop    Stability/Clinical Decision Making Evolving/Moderate complexity    Rehab Potential Fair    Clinical Impairments Affecting Rehab Potential Cardiorespiratory limitations    PT Frequency 2x / week    PT Duration 12 weeks    PT Treatment/Interventions ADLs/Self Care Home Management;Moist Heat;Cryotherapy;Electrical Stimulation;Functional mobility training;Therapeutic activities;Therapeutic exercise;DME Instruction;Gait training;Balance training;Neuromuscular re-education;Patient/family education;Energy conservation;Biofeedback;Ultrasound;Stair training;Orthotic Fit/Training;Manual techniques;Manual lymph drainage;Dry needling;Passive range of motion;Taping;Vestibular;Visual/perceptual remediation/compensation    PT Next Visit Plan Continue with actiivty tolerance, standing tolerance, postural strengthening, overground AMB    PT Home Exercise Plan No updates today    Consulted and Agree with Plan of  Care Patient             Patient will benefit from skilled therapeutic intervention in order to improve the following deficits and impairments:  Abnormal gait, Difficulty walking, Cardiopulmonary status limiting activity, Decreased endurance, Impaired UE functional use, Obesity, Decreased activity tolerance, Decreased balance, Improper body mechanics, Decreased mobility, Decreased strength, Impaired sensation, Postural dysfunction, Increased edema, Impaired flexibility, Decreased range of motion, Decreased coordination, Pain  Visit Diagnosis: Abnormality of gait and mobility  Muscle weakness (generalized)  Unsteadiness on feet     Problem List Patient Active Problem List   Diagnosis Date Noted   Atrial fibrillation ([place]) 04/09/2019   Essential hypertension 04/09/2019   Venous stasis dermatitis of both lower extremities 04/09/2019   Lower limb ulcer, ankle, right, limited to breakdown of skin ([place]) 04/09/2019   Rhabdomyolysis 12/10/2018   Sepsis ([place]) 07/28/2018   Cough 03/20/2015   Bronchitis, chronic obstructive w acute bronchitis ([place]) 38/17/7116   Umbilical hernia without obstruction and without gangrene 01/12/2015   Ventral hernia without obstruction or gangrene 01/02/2015    Janna Arch, PT, DPT  12/02/2020, 5:30 PM  [place] [address] [place], [place], [address] Phone: [phone]   Fax:  [phone]  Name: Daniel Eaton. MRN: [id number] Date of Birth: 1940/10/10

## 2020-12-07 ENCOUNTER — Ambulatory Visit: Payer: HMO

## 2020-12-09 ENCOUNTER — Other Ambulatory Visit: Payer: Self-pay

## 2020-12-09 ENCOUNTER — Ambulatory Visit: Payer: HMO

## 2020-12-09 DIAGNOSIS — R269 Unspecified abnormalities of gait and mobility: Secondary | ICD-10-CM | POA: Diagnosis not present

## 2020-12-09 DIAGNOSIS — R2681 Unsteadiness on feet: Secondary | ICD-10-CM

## 2020-12-09 DIAGNOSIS — M6281 Muscle weakness (generalized): Secondary | ICD-10-CM

## 2020-12-09 NOTE — Therapy (Signed)
[place] [facility] SERVICES [address] [place], [place], [address] Phone: [phone]   Fax:  [phone]  Physical Therapy Treatment  Patient Details  Name: Daniel Eaton. MRN: [id number] Date of Birth: 02-Jan-1941 Referring Provider (PT): Dr. [name] MD   Encounter Date: 12/09/2020   PT End of Session - 12/09/20 1536     Visit Number 28    Number of Visits 41    Date for PT Re-Evaluation 12/29/20    Authorization Type [employer]    Authorization Time Period 10/06/20-12/29/20    PT Start Time 1519    PT Stop Time 1600    PT Time Calculation (min) 41 min    Equipment Utilized During Treatment Gait belt    Activity Tolerance Patient limited by fatigue;Patient tolerated treatment well;Patient limited by pain    Behavior During Therapy WFL for tasks assessed/performed             Past Medical History:  Diagnosis Date   A-fib ([place])    Cellulitis    CHF (congestive heart failure) ([place])    COPD (chronic obstructive pulmonary disease) ([place])    Difficult intubation    Hyperlipidemia    Hypertension    Hypothyroidism    Kidney disease    Sleep apnea    Stroke [facility])    Thyroid disease     Past Surgical History:  Procedure Laterality Date   CARDIAC CATHETERIZATION     CATARACT EXTRACTION W/ INTRAOCULAR LENS  IMPLANT, BILATERAL     COLONOSCOPY  2008   HERNIA REPAIR     bilateral inguinal hernia/ [facility]   HERNIA REPAIR  02/02/2015   18 x 28 cm ventral light mesh placed laparoscopically.   TONSILLECTOMY     UMBILICAL HERNIA REPAIR N/A 02/02/2015   Procedure: HERNIA REPAIR UMBILICAL ADULT;  Surgeon: Robert Bellow, MD;  Location: [facility] ORS;  Service: General;  Laterality: N/A;   VENTRAL HERNIA REPAIR N/A 02/02/2015   Procedure: LAPAROSCOPIC VENTRAL HERNIA;  Surgeon: Robert Bellow, MD;  Location: [facility] ORS;  Service: General;  Laterality: N/A;   vp shunt placement  1979    There were no vitals  filed for this visit.   Subjective Assessment - 12/09/20 1523     Subjective Patient missed last session due to weakness from his diauretics. No falls or LOB since last session.    Pertinent History Pt is a 80 y.o. male referred to PT for Physical deconditioning. Pt is known to this clinic and has received rehab here in 2019. Pt has a significant PMH of COPD, A-fib, Aortic Atherosclerosis, ischemic cardiomyopathy, benign hypertension with chronic kidney disease, stage III (CMS-HCC); Essential hypertension; Chronic bronchitis, unspecified chronic bronchitis type (CMS-HCC); OSA (obstructive sleep apnea); Morbid obesity with BMI of 40.0-44.9, adult (CMS-HCC) per last cardiology visit. Pt reports being unable to being able to participate in PT during his stay at [facility] this previous October and also tested positive for COVID-19.  Pt's goal is to focus on gait, balance, LE strength, and walking endurance with RW. Was last seen by this therapist on 05/27/20.    Limitations Standing;House hold activities;Walking;Lifting    How long can you sit comfortably? hour    How long can you stand comfortably? not sure    How long can you walk comfortably? walk with RW to bathroom and back    Patient Stated Goals Improve gait, balance, and LE strength. Reduce risk of falls.  Currently in Pain? No/denies                    Treatment:  Ambulate 96 ft with RW and CGA ; one standing rest break; increased trunk flexion noted with increased reliance upon Ue's with fatigue. HR elevated to 126    Airex pad static stand 20 seconds first trial, static stand 45 seconds second trial.    ambulate one length of // bars without UE support x2 trials at start and end of session.    Lateral stepping 2x length of // bars with heavy BUE  Seated:  6x sit to stands with UE support, HR 121 by end of last sit  Gluteal squeezes 10x 3 second holds  Scapular retraction with purse lipped breathing inbetween  interventions   Vitals monitored throughout session to ensure therapeutic range.   Pt educated throughout session about proper posture and technique with exercises. Improved exercise technique, movement at target joints, use of target muscles after min to mod verbal, visual, tactile cues.     Patient presents with excellent motivation to physical therapy session. He is fatigued throughout session requiring seated rest breaks between each intervention. HR monitoring required throughout session due to elevation with interventions. Continued focus on safe mobility and transfers tolerated well. Patient is able to perform one more sit to stand this session indicating improved transfer tolerance. Patient would benefit from additional skilled PT Intervention to improve strength, balance and mobility                PT Education - 12/09/20 1536     Education provided Yes    Education Details exercise technique, body mechanics    Person(s) Educated Patient    Methods Explanation;Demonstration;Tactile cues;Verbal cues    Comprehension Verbalized understanding;Returned demonstration;Verbal cues required;Tactile cues required              PT Short Term Goals - 08/26/20 1154       PT SHORT TERM GOAL #1   Title Pt will be compliant with given HEP to improve LE muscle strength.    Baseline 6/1: HEP added to 7/13: HEP compliant    Time 6    Period Weeks    Status Achieved    Target Date 08/26/20      PT SHORT TERM GOAL #2   Title Patient will stand from standard height chair on first attempt to allow for improved mobility.     Baseline 6/1: unable to perform 7/13: MET    Time 6    Period Weeks    Status Achieved    Target Date 08/26/20               PT Long Term Goals - 10/28/20 1631       PT LONG TERM GOAL #1   Title Pt will improve FOTO goal to 48 to display perceived improvements in functional mobility.    Baseline 6/1: 40% 7/13: 45% 8/23: 40%,9/14: 45%     Time 12    Period Weeks    Status Partially Met    Target Date 12/29/20      PT LONG TERM GOAL #2   Title Patient (> 20 years old) will complete five times sit to stand test in < 1 minute indicating an increased LE strength and improved balance.    Baseline 6/1: 1 min 31 seconds with heavy BUE support 7/13: 49 seconds with heavy BUE support    Time 12    Period  Weeks    Status Achieved      PT LONG TERM GOAL #3   Title Patient will increase BLE gross strength to 4+/5 as to improve functional strength for independent gait, increased standing tolerance and increased ADL ability.    Baseline 6/1: see note 7/13: see note 8/23: grossly 4/5 bilaterally with hip extension 3+/5    Time 12    Period Weeks    Status Partially Met      PT LONG TERM GOAL #4   Title Patient will increase 10 meter walk test to >1.0 m/s as to improve gait speed for better community ambulation and to reduce fall risk.    Baseline 6/1: 0.41 m/s with RW 7/13: 0.6 m/s with walker 8/23: 0.6 m/s with walker 9/14: 0.61 m/s with RW    Time 12    Period Weeks    Status On-going    Target Date 12/29/20      PT LONG TERM GOAL #5   Title Patient will stand without UE support >3 minutes for ADL performance and increased stability with independent movement.    Baseline 6/1: 1 min 7 seconds 7/13: 3 min 3 seconds    Time 12    Period Weeks    Status Achieved      PT LONG TERM GOAL #6   Title Patient will increase Berg Balance score by > 6 points (29/56 )  to demonstrate decreased fall risk during functional activities.    Baseline 8/23: 23/56    Time 12    Period Weeks    Status New      PT LONG TERM GOAL #7   Title Patient will ambulate >300 ft during 2 minute walk test with RW for improved capacity for functional mobility.    Baseline 8/23: 88 ft with frequent standing breaks; 9/14: 173f    Time 12    Period Weeks    Status Partially Met    Target Date 12/29/20                   Plan - 12/09/20 1543      Clinical Impression Statement Patient presents with excellent motivation to physical therapy session. He is fatigued throughout session requiring seated rest breaks between each intervention. HR monitoring required throughout session due to elevation with interventions. Continued focus on safe mobility and transfers tolerated well. Patient is able to perform one more sit to stand this session indicating improved transfer tolerance. Patient would benefit from additional skilled PT Intervention to improve strength, balance and mobility    Personal Factors and Comorbidities Age;Comorbidity 3+;Past/Current Experience;Fitness    Comorbidities OPD, A-fib, Aortic Atherosclerosis, ischemic cardiomyopathy, Benign hypertension with chronic kidney disease, stage III (CMS-HCC); Essential hypertension; Chronic bronchitis, unspecified chronic bronchitis type (CMS-HCC); OSA (obstructive sleep apnea); Morbid obesity with BMI of 40.0-44.9, adult (CMS-HCC)    Examination-Activity Limitations Bend;Carry;Squat;Continence;Stand;Transfers;Locomotion Level;Reach Overhead;Bed Mobility;Lift;Stairs;Toileting;Dressing    Examination-Participation Restrictions Community Activity;Laundry;Yard Work;Cleaning;Driving;Meal Prep;Shop    Stability/Clinical Decision Making Evolving/Moderate complexity    Rehab Potential Fair    Clinical Impairments Affecting Rehab Potential Cardiorespiratory limitations    PT Frequency 2x / week    PT Duration 12 weeks    PT Treatment/Interventions ADLs/Self Care Home Management;Moist Heat;Cryotherapy;Electrical Stimulation;Functional mobility training;Therapeutic activities;Therapeutic exercise;DME Instruction;Gait training;Balance training;Neuromuscular re-education;Patient/family education;Energy conservation;Biofeedback;Ultrasound;Stair training;Orthotic Fit/Training;Manual techniques;Manual lymph drainage;Dry needling;Passive range of motion;Taping;Vestibular;Visual/perceptual  remediation/compensation    PT Next Visit Plan Continue with actiivty tolerance, standing tolerance, postural strengthening, overground AMB    PT Home Exercise  Plan No updates today    Consulted and Agree with Plan of Care Patient             Patient will benefit from skilled therapeutic intervention in order to improve the following deficits and impairments:  Abnormal gait, Difficulty walking, Cardiopulmonary status limiting activity, Decreased endurance, Impaired UE functional use, Obesity, Decreased activity tolerance, Decreased balance, Improper body mechanics, Decreased mobility, Decreased strength, Impaired sensation, Postural dysfunction, Increased edema, Impaired flexibility, Decreased range of motion, Decreased coordination, Pain  Visit Diagnosis: Abnormality of gait and mobility  Muscle weakness (generalized)  Unsteadiness on feet     Problem List Patient Active Problem List   Diagnosis Date Noted   Atrial fibrillation ([place]) 04/09/2019   Essential hypertension 04/09/2019   Venous stasis dermatitis of both lower extremities 04/09/2019   Lower limb ulcer, ankle, right, limited to breakdown of skin ([place]) 04/09/2019   Rhabdomyolysis 12/10/2018   Sepsis ([place]) 07/28/2018   Cough 03/20/2015   Bronchitis, chronic obstructive w acute bronchitis ([place]) 43/92/6599   Umbilical hernia without obstruction and without gangrene 01/12/2015   Ventral hernia without obstruction or gangrene 01/02/2015   Janna Arch, PT, DPT  12/09/2020, 3:59 PM  [place] [facility] [address] [place], [place], [address] Phone: [phone]   Fax:  [phone]  Name: Daniel Eaton. MRN: [id number] Date of Birth: 08/28/1940

## 2020-12-14 ENCOUNTER — Ambulatory Visit: Payer: HMO

## 2020-12-15 DIAGNOSIS — J449 Chronic obstructive pulmonary disease, unspecified: Secondary | ICD-10-CM | POA: Diagnosis not present

## 2020-12-15 DIAGNOSIS — G4733 Obstructive sleep apnea (adult) (pediatric): Secondary | ICD-10-CM | POA: Diagnosis not present

## 2020-12-16 ENCOUNTER — Ambulatory Visit: Payer: HMO | Attending: Internal Medicine

## 2020-12-16 ENCOUNTER — Other Ambulatory Visit: Payer: Self-pay

## 2020-12-16 DIAGNOSIS — R269 Unspecified abnormalities of gait and mobility: Secondary | ICD-10-CM | POA: Insufficient documentation

## 2020-12-16 DIAGNOSIS — R2689 Other abnormalities of gait and mobility: Secondary | ICD-10-CM | POA: Insufficient documentation

## 2020-12-16 DIAGNOSIS — R262 Difficulty in walking, not elsewhere classified: Secondary | ICD-10-CM | POA: Insufficient documentation

## 2020-12-16 DIAGNOSIS — M6281 Muscle weakness (generalized): Secondary | ICD-10-CM | POA: Diagnosis not present

## 2020-12-16 DIAGNOSIS — R2681 Unsteadiness on feet: Secondary | ICD-10-CM | POA: Insufficient documentation

## 2020-12-16 NOTE — Therapy (Signed)
[place] MAIN [facility] SERVICES [address] [place], [place], [address] Phone: [phone]   Fax:  [phone]  Physical Therapy Treatment  Patient Details  Name: Daniel Eaton. MRN: [id number] Date of Birth: March 02, 1940 Referring Provider (PT): Dr. [name] MD   Encounter Date: 12/16/2020   PT End of Session - 12/16/20 1611     Visit Number 29    Number of Visits 41    Date for PT Re-Evaluation 12/29/20    Authorization Type [employer]    Authorization Time Period 10/06/20-12/29/20    PT Start Time 1600    PT Stop Time 1644    PT Time Calculation (min) 44 min    Equipment Utilized During Treatment Gait belt    Activity Tolerance Patient limited by fatigue;Patient tolerated treatment well;Patient limited by pain    Behavior During Therapy WFL for tasks assessed/performed             Past Medical History:  Diagnosis Date   A-fib ([place])    Cellulitis    CHF (congestive heart failure) ([place])    COPD (chronic obstructive pulmonary disease) ([place])    Difficult intubation    Hyperlipidemia    Hypertension    Hypothyroidism    Kidney disease    Sleep apnea    Stroke [facility])    Thyroid disease     Past Surgical History:  Procedure Laterality Date   CARDIAC CATHETERIZATION     CATARACT EXTRACTION W/ INTRAOCULAR LENS  IMPLANT, BILATERAL     COLONOSCOPY  2008   HERNIA REPAIR     bilateral inguinal hernia/ [facility]   HERNIA REPAIR  02/02/2015   18 x 28 cm ventral light mesh placed laparoscopically.   TONSILLECTOMY     UMBILICAL HERNIA REPAIR N/A 02/02/2015   Procedure: HERNIA REPAIR UMBILICAL ADULT;  Surgeon: Robert Bellow, MD;  Location: [facility] ORS;  Service: General;  Laterality: N/A;   VENTRAL HERNIA REPAIR N/A 02/02/2015   Procedure: LAPAROSCOPIC VENTRAL HERNIA;  Surgeon: Robert Bellow, MD;  Location: [facility] ORS;  Service: General;  Laterality: N/A;   vp shunt placement  1979    There were no vitals  filed for this visit.   Subjective Assessment - 12/16/20 1610     Subjective Patient reports today is a bad day, legs are feeling numb from sitting so long. Had to sit for two hours due to taking his wife to an appointment. Has injury to R hand. His shoulders and knees are hurting.    Pertinent History Pt is a 80 y.o. male referred to PT for Physical deconditioning. Pt is known to this clinic and has received rehab here in 2019. Pt has a significant PMH of COPD, A-fib, Aortic Atherosclerosis, ischemic cardiomyopathy, benign hypertension with chronic kidney disease, stage III (CMS-HCC); Essential hypertension; Chronic bronchitis, unspecified chronic bronchitis type (CMS-HCC); OSA (obstructive sleep apnea); Morbid obesity with BMI of 40.0-44.9, adult (CMS-HCC) per last cardiology visit. Pt reports being unable to being able to participate in PT during his stay at [facility] this previous October and also tested positive for COVID-19.  Pt's goal is to focus on gait, balance, LE strength, and walking endurance with RW. Was last seen by this therapist on 05/27/20.    Limitations Standing;House hold activities;Walking;Lifting    How long can you sit comfortably? hour    How long can you stand comfortably? not sure    How long can you walk comfortably? walk with  RW to bathroom and back    Patient Stated Goals Improve gait, balance, and LE strength. Reduce risk of falls.    Currently in Pain? Yes    Pain Score 8     Pain Location Shoulder   shoulder, knees   Pain Orientation Right;Left    Pain Descriptors / Indicators Aching    Pain Type Chronic pain                   Patient reports today is a bad day, legs are feeling numb from sitting so long. Had to sit for two hours due to taking his wife to an appointment. Has injury to R hand. His shoulders and knees are hurting.      Treatment:    Nustep Lvl 3 ; seat position 13; arms 11; SPM>30 for cardiovascular and musculoskeletal challenge  5 minutes;   -SPT to Nustep, very slow max cueing for sequencing  Standing: -Ambulate 45 ft with RW; slow steppage due to fear of LOB; HR 126 after ambulation -static stand 60 seconds no UE support    Seated: Gluteal squeezes 10x 3 second holds Lateral step over theraband 15x each LE  Forward toe taps 20x each LE onto theraband for coordination, spatial awareness, and foot placement.  Hamstring isometric into dynadisc 15x 3 second holds GTB adduction 15x each LE   Scapular retraction with purse lipped breathing inbetween interventions   Patient requires assistance for toileting at end of session, requires assistance with set up and CGA during standing.    Vitals monitored throughout session to ensure therapeutic range.    Pt educated throughout session about proper posture and technique with exercises. Improved exercise technique, movement at target joints, use of target muscles after min to mod verbal, visual, tactile cues.   Patient presents with limited capacity for standing mobility initially. Despite his fatigue he remains highly motivated for progression of strengthening. Seated interventions performed and tolerated well. Patient would benefit from additional skilled PT Intervention to improve strength, balance and mobility               PT Education - 12/16/20 1610     Education provided Yes    Education Details exercise technique, body mechanics    Person(s) Educated Patient    Methods Explanation;Demonstration;Tactile cues;Verbal cues    Comprehension Verbalized understanding;Returned demonstration;Verbal cues required;Tactile cues required              PT Short Term Goals - 08/26/20 1154       PT SHORT TERM GOAL #1   Title Pt will be compliant with given HEP to improve LE muscle strength.    Baseline 6/1: HEP added to 7/13: HEP compliant    Time 6    Period Weeks    Status Achieved    Target Date 08/26/20      PT SHORT TERM GOAL #2   Title  Patient will stand from standard height chair on first attempt to allow for improved mobility.     Baseline 6/1: unable to perform 7/13: MET    Time 6    Period Weeks    Status Achieved    Target Date 08/26/20               PT Long Term Goals - 10/28/20 1631       PT LONG TERM GOAL #1   Title Pt will improve FOTO goal to 48 to display perceived improvements in functional mobility.    Baseline  6/1: 40% 7/13: 45% 8/23: 40%,9/14: 45%    Time 12    Period Weeks    Status Partially Met    Target Date 12/29/20      PT LONG TERM GOAL #2   Title Patient (> 60 years old) will complete five times sit to stand test in < 1 minute indicating an increased LE strength and improved balance.    Baseline 6/1: 1 min 31 seconds with heavy BUE support 7/13: 49 seconds with heavy BUE support    Time 12    Period Weeks    Status Achieved      PT LONG TERM GOAL #3   Title Patient will increase BLE gross strength to 4+/5 as to improve functional strength for independent gait, increased standing tolerance and increased ADL ability.    Baseline 6/1: see note 7/13: see note 8/23: grossly 4/5 bilaterally with hip extension 3+/5    Time 12    Period Weeks    Status Partially Met      PT LONG TERM GOAL #4   Title Patient will increase 10 meter walk test to >1.0 m/s as to improve gait speed for better community ambulation and to reduce fall risk.    Baseline 6/1: 0.41 m/s with RW 7/13: 0.6 m/s with walker 8/23: 0.6 m/s with walker 9/14: 0.61 m/s with RW    Time 12    Period Weeks    Status On-going    Target Date 12/29/20      PT LONG TERM GOAL #5   Title Patient will stand without UE support >3 minutes for ADL performance and increased stability with independent movement.    Baseline 6/1: 1 min 7 seconds 7/13: 3 min 3 seconds    Time 12    Period Weeks    Status Achieved      PT LONG TERM GOAL #6   Title Patient will increase Berg Balance score by > 6 points (29/56 )  to demonstrate decreased  fall risk during functional activities.    Baseline 8/23: 23/56    Time 12    Period Weeks    Status New      PT LONG TERM GOAL #7   Title Patient will ambulate >300 ft during 2 minute walk test with RW for improved capacity for functional mobility.    Baseline 8/23: 88 ft with frequent standing breaks; 9/14: 100ft    Time 12    Period Weeks    Status Partially Met    Target Date 12/29/20                    Patient will benefit from skilled therapeutic intervention in order to improve the following deficits and impairments:     Visit Diagnosis: Abnormality of gait and mobility  Muscle weakness (generalized)  Unsteadiness on feet     Problem List Patient Active Problem List   Diagnosis Date Noted   Atrial fibrillation (HCC) 04/09/2019   Essential hypertension 04/09/2019   Venous stasis dermatitis of both lower extremities 04/09/2019   Lower limb ulcer, ankle, right, limited to breakdown of skin (HCC) 04/09/2019   Rhabdomyolysis 12/10/2018   Sepsis (HCC) 07/28/2018   Cough 03/20/2015   Bronchitis, chronic obstructive w acute bronchitis (HCC) 03/20/2015   Umbilical hernia without obstruction and without gangrene 01/12/2015   Ventral hernia without obstruction or gangrene 01/02/2015    , PT, DPT  12/16/2020, 5:10 PM  [place]   SERVICES [place], [place], [address] Phone: [phone]   Fax:  [phone]  Name: Eliu Batch. MRN: [id number] Date of Birth: 27-May-1940

## 2020-12-21 ENCOUNTER — Ambulatory Visit: Payer: HMO | Admitting: Physical Therapy

## 2020-12-23 ENCOUNTER — Ambulatory Visit: Payer: HMO

## 2020-12-23 ENCOUNTER — Other Ambulatory Visit: Payer: Self-pay

## 2020-12-23 DIAGNOSIS — R269 Unspecified abnormalities of gait and mobility: Secondary | ICD-10-CM

## 2020-12-23 DIAGNOSIS — R2681 Unsteadiness on feet: Secondary | ICD-10-CM

## 2020-12-23 DIAGNOSIS — M6281 Muscle weakness (generalized): Secondary | ICD-10-CM

## 2020-12-23 NOTE — Therapy (Signed)
[place] [facility] [address] [place], [place], [address] Phone: [phone]   Fax:  [phone]  Physical Therapy Treatment Physical Therapy Progress Note   Dates of reporting period  10/28/20   to   12/23/20   Patient Details  Name: Daniel Eaton. MRN: [id number] Date of Birth: Dec 19, 1940 Referring Provider (PT): Dr. [name] MD   Encounter Date: 12/23/2020   PT End of Session - 12/23/20 1741     Visit Number 30    Number of Visits 41    Date for PT Re-Evaluation 12/29/20    Authorization Type next session 1/10 PN 11/9    Authorization Time Period 10/06/20-12/29/20    PT Start Time 1611    PT Stop Time 1645    PT Time Calculation (min) 34 min    Equipment Utilized During Treatment Gait belt    Activity Tolerance Patient limited by fatigue;Patient tolerated treatment well;Patient limited by pain    Behavior During Therapy WFL for tasks assessed/performed             Past Medical History:  Diagnosis Date   A-fib ([place])    Cellulitis    CHF (congestive heart failure) ([place])    COPD (chronic obstructive pulmonary disease) ([place])    Difficult intubation    Hyperlipidemia    Hypertension    Hypothyroidism    Kidney disease    Sleep apnea    Stroke [facility])    Thyroid disease     Past Surgical History:  Procedure Laterality Date   CARDIAC CATHETERIZATION     CATARACT EXTRACTION W/ INTRAOCULAR LENS  IMPLANT, BILATERAL     COLONOSCOPY  2008   HERNIA REPAIR     bilateral inguinal hernia/ [place]   HERNIA REPAIR  02/02/2015   18 x 28 cm ventral light mesh placed laparoscopically.   TONSILLECTOMY     UMBILICAL HERNIA REPAIR N/A 02/02/2015   Procedure: HERNIA REPAIR UMBILICAL ADULT;  Surgeon: Robert Bellow, MD;  Location: [facility] ORS;  Service: General;  Laterality: N/A;   VENTRAL HERNIA REPAIR N/A 02/02/2015   Procedure: LAPAROSCOPIC VENTRAL HERNIA;  Surgeon: Robert Bellow, MD;  Location: [facility] ORS;   Service: General;  Laterality: N/A;   vp shunt placement  1979    There were no vitals filed for this visit.   Subjective Assessment - 12/23/20 1739     Subjective Patient missed last session due to to not feeling well from medication.  Wants to talk to physician about changing medication due to it wiping him out.    Pertinent History Pt is a 80 y.o. male referred to PT for Physical deconditioning. Pt is known to this clinic and has received rehab here in 2019. Pt has a significant PMH of COPD, A-fib, Aortic Atherosclerosis, ischemic cardiomyopathy, benign hypertension with chronic kidney disease, stage III (CMS-HCC); Essential hypertension; Chronic bronchitis, unspecified chronic bronchitis type (CMS-HCC); OSA (obstructive sleep apnea); Morbid obesity with BMI of 40.0-44.9, adult (CMS-HCC) per last cardiology visit. Pt reports being unable to being able to participate in PT during his stay at [facility] this previous October and also tested positive for COVID-19.  Pt's goal is to focus on gait, balance, LE strength, and walking endurance with RW. Was last seen by this therapist on 05/27/20.    Limitations Standing;House hold activities;Walking;Lifting    How long can you sit comfortably? hour    How long can you stand comfortably? not sure  How long can you walk comfortably? walk with RW to bathroom and back    Patient Stated Goals Improve gait, balance, and LE strength. Reduce risk of falls.    Currently in Pain? Yes    Pain Score --   general soreness   Pain Location Leg    Pain Orientation Right;Left    Pain Descriptors / Indicators Aching    Pain Type Chronic pain             Patient was not feeling well the past couple of days.     [facility] PT Assessment - 12/23/20 0001       Standardized Balance Assessment   Standardized Balance Assessment Berg Balance Test      Berg Balance Test   Sit to Stand Able to stand  independently using hands    Standing Unsupported Able to  stand 2 minutes with supervision    Sitting with Back Unsupported but Feet Supported on Floor or Stool Able to sit safely and securely 2 minutes    Stand to Sit Controls descent by using hands    Transfers Able to transfer safely, definite need of hands    Standing Unsupported with Eyes Closed Able to stand 3 seconds    Standing Unsupported with Feet Together Needs help to attain position but able to stand for 30 seconds with feet together    From Standing, Reach Forward with Outstretched Arm Can reach forward >5 cm safely (2")    From Standing Position, Pick up Object from Floor Unable to pick up and needs supervision    From Standing Position, Turn to Look Behind Over each Shoulder Needs supervision when turning    Turn 360 Degrees Needs assistance while turning    Standing Unsupported, Alternately Place Feet on Step/Stool Able to complete >2 steps/needs minimal assist    Standing Unsupported, One Foot in Front Needs help to step but can hold 15 seconds    Standing on One Leg Unable to try or needs assist to prevent fall    Total Score 25             Progress note FOTO; test next time BLE strength:test next time 5x STS: 48.5  seconds heavy BUE support, not full stand for two (same) 10 MWT: 20.59 seconds = 0.49 m/s (decreased) BERG: 25/ 56 (improve) Ambulate 300 ft during 2 min walk test; test next time   Pt educated throughout session about proper posture and technique with exercises. Improved exercise technique, movement at target joints, use of target muscles after min to mod verbal, visual, tactile cues.  Note: Portions of this document were prepared using Dragon voice recognition software and although reviewed may contain unintentional dictation errors in syntax, grammar, or spelling.  Patient's condition has the potential to improve in response to therapy. Maximum improvement is yet to be obtained. The anticipated improvement is attainable and reasonable in a generally  predictable time.  Patient reports he wants to continue therapy, is trying hard and doing his homework.    Next session will do a trial recert and perform FOTO, strength, and 2 min walk test goals.  Session was severely limited due to patient late arrival.  Patient made aware that recert session is next week and due to limited progress will be a trial recert.  Patient's 5 times sit to stand is approximately the same as when tested previously and will be an area for continued focus.  Patient's 10 m walk test time was decreased  however Berg improved by 2 points.Patient's condition has the potential to improve in response to therapy. Maximum improvement is yet to be obtained. The anticipated improvement is attainable and reasonable in a generally predictable time.Patient would benefit from additional skilled PT Intervention to improve strength, balance and mobility              PT Education - 12/23/20 1740     Education provided Yes    Education Details Goals plan of care progress note    Person(s) Educated Patient    Methods Explanation;Demonstration;Tactile cues;Verbal cues    Comprehension Verbalized understanding;Returned demonstration;Verbal cues required;Tactile cues required              PT Short Term Goals - 08/26/20 1154       PT SHORT TERM GOAL #1   Title Pt will be compliant with given HEP to improve LE muscle strength.    Baseline 6/1: HEP added to 7/13: HEP compliant    Time 6    Period Weeks    Status Achieved    Target Date 08/26/20      PT SHORT TERM GOAL #2   Title Patient will stand from standard height chair on first attempt to allow for improved mobility.     Baseline 6/1: unable to perform 7/13: MET    Time 6    Period Weeks    Status Achieved    Target Date 08/26/20               PT Long Term Goals - 12/23/20 1744       PT LONG TERM GOAL #1   Title Pt will improve FOTO goal to 48 to display perceived improvements in functional mobility.     Baseline 6/1: 40% 7/13: 45% 8/23: 40%,9/14: 45%    Time 12    Period Weeks    Status Partially Met    Target Date 12/29/20      PT LONG TERM GOAL #2   Title Patient (> 28 years old) will complete five times sit to stand test in < 1 minute indicating an increased LE strength and improved balance.    Baseline 6/1: 1 min 31 seconds with heavy BUE support 7/13: 49 seconds with heavy BUE support 11/9: 48.5 seconds    Time 12    Period Weeks    Status Achieved      PT LONG TERM GOAL #3   Title Patient will increase BLE gross strength to 4+/5 as to improve functional strength for independent gait, increased standing tolerance and increased ADL ability.    Baseline 6/1: see note 7/13: see note 8/23: grossly 4/5 bilaterally with hip extension 3+/5    Time 12    Period Weeks    Status Partially Met    Target Date 12/29/20      PT LONG TERM GOAL #4   Title Patient will increase 10 meter walk test to >1.0 m/s as to improve gait speed for better community ambulation and to reduce fall risk.    Baseline 6/1: 0.41 m/s with RW 7/13: 0.6 m/s with walker 8/23: 0.6 m/s with walker 9/14: 0.61 m/s with RW 11/9: 0.49 m/s with RW    Time 12    Period Weeks    Status On-going    Target Date 12/29/20      PT LONG TERM GOAL #5   Title Patient will stand without UE support >3 minutes for ADL performance and increased stability with independent movement.  Baseline 6/1: 1 min 7 seconds 7/13: 3 min 3 seconds    Time 12    Period Weeks    Status Achieved      PT LONG TERM GOAL #6   Title Patient will increase Berg Balance score by > 6 points (29/56 )  to demonstrate decreased fall risk during functional activities.    Baseline 8/23: 23/56 11/9: 25/56    Time 12    Period Weeks    Status Partially Met    Target Date 12/29/20      PT LONG TERM GOAL #7   Title Patient will ambulate >300 ft during 2 minute walk test with RW for improved capacity for functional mobility.    Baseline 8/23: 88 ft with  frequent standing breaks; 9/14: 131f    Time 12    Period Weeks    Status Partially Met    Target Date 12/29/20                   Plan - 12/23/20 1744     Clinical Impression Statement Next session will do a trial recert and perform FOTO, strength, and 2 min walk test goals.  Session was severely limited due to patient late arrival.  Patient made aware that recert session is next week and due to limited progress will be a trial recert.  Patient's 5 times sit to stand is approximately the same as when tested previously and will be an area for continued focus.  Patient's 10 m walk test time was decreased however Berg improved by 2 points.Patient's condition has the potential to improve in response to therapy. Maximum improvement is yet to be obtained. The anticipated improvement is attainable and reasonable in a generally predictable time.Patient would benefit from additional skilled PT Intervention to improve strength, balance and mobility    Personal Factors and Comorbidities Age;Comorbidity 3+;Past/Current Experience;Fitness    Comorbidities OPD, A-fib, Aortic Atherosclerosis, ischemic cardiomyopathy, Benign hypertension with chronic kidney disease, stage III (CMS-HCC); Essential hypertension; Chronic bronchitis, unspecified chronic bronchitis type (CMS-HCC); OSA (obstructive sleep apnea); Morbid obesity with BMI of 40.0-44.9, adult (CMS-HCC)    Examination-Activity Limitations Bend;Carry;Squat;Continence;Stand;Transfers;Locomotion Level;Reach Overhead;Bed Mobility;Lift;Stairs;Toileting;Dressing    Examination-Participation Restrictions Community Activity;Laundry;Yard Work;Cleaning;Driving;Meal Prep;Shop    Stability/Clinical Decision Making Evolving/Moderate complexity    Rehab Potential Fair    Clinical Impairments Affecting Rehab Potential Cardiorespiratory limitations    PT Frequency 2x / week    PT Duration 12 weeks    PT Treatment/Interventions ADLs/Self Care Home  Management;Moist Heat;Cryotherapy;Electrical Stimulation;Functional mobility training;Therapeutic activities;Therapeutic exercise;DME Instruction;Gait training;Balance training;Neuromuscular re-education;Patient/family education;Energy conservation;Biofeedback;Ultrasound;Stair training;Orthotic Fit/Training;Manual techniques;Manual lymph drainage;Dry needling;Passive range of motion;Taping;Vestibular;Visual/perceptual remediation/compensation    PT Next Visit Plan recert    PT Home Exercise Plan No updates today    Consulted and Agree with Plan of Care Patient             Patient will benefit from skilled therapeutic intervention in order to improve the following deficits and impairments:  Abnormal gait, Difficulty walking, Cardiopulmonary status limiting activity, Decreased endurance, Impaired UE functional use, Obesity, Decreased activity tolerance, Decreased balance, Improper body mechanics, Decreased mobility, Decreased strength, Impaired sensation, Postural dysfunction, Increased edema, Impaired flexibility, Decreased range of motion, Decreased coordination, Pain  Visit Diagnosis: Abnormality of gait and mobility  Muscle weakness (generalized)  Unsteadiness on feet     Problem List Patient Active Problem List   Diagnosis Date Noted   Atrial fibrillation ([place] 04/09/2019   Essential hypertension 04/09/2019   Venous stasis dermatitis of  both lower extremities 04/09/2019   Lower limb ulcer, ankle, right, limited to breakdown of skin ([place]) 04/09/2019   Rhabdomyolysis 12/10/2018   Sepsis ([place]) 07/28/2018   Cough 03/20/2015   Bronchitis, chronic obstructive w acute bronchitis ([place]) 83/75/4237   Umbilical hernia without obstruction and without gangrene 01/12/2015   Ventral hernia without obstruction or gangrene 01/02/2015    Janna Arch, PT, DPT  12/23/2020, 5:46 PM  [facility] [address] [place], [place],  [address] Phone: [phone]   Fax:  [phone]  Name: Woodard Perrell. MRN: [id number] Date of Birth: 1940/04/19

## 2020-12-28 ENCOUNTER — Ambulatory Visit: Payer: HMO

## 2020-12-28 ENCOUNTER — Other Ambulatory Visit: Payer: Self-pay

## 2020-12-28 DIAGNOSIS — R269 Unspecified abnormalities of gait and mobility: Secondary | ICD-10-CM | POA: Diagnosis not present

## 2020-12-28 DIAGNOSIS — M6281 Muscle weakness (generalized): Secondary | ICD-10-CM

## 2020-12-28 DIAGNOSIS — R2681 Unsteadiness on feet: Secondary | ICD-10-CM

## 2020-12-28 NOTE — Therapy (Signed)
[place] [facility] SERVICES [address] [place], [place], [address] Phone: [phone]   Fax:  [phone]  Physical Therapy Treatment/RECERT  Patient Details  Name: Daniel Eaton. MRN: [id number] Date of Birth: [birth year] Referring Provider (PT): Dr. [name] MD   Encounter Date: 12/28/2020   PT End of Session - 12/28/20 1624     Visit Number 31    Number of Visits 46    Date for PT Re-Evaluation 03/22/21    Authorization Type 1/10 PN 11/9    Authorization Time Period 10/06/20-12/29/20    PT Start Time 1602    PT Stop Time 1644    PT Time Calculation (min) 42 min    Equipment Utilized During Treatment Gait belt    Activity Tolerance Patient limited by fatigue;Patient tolerated treatment well;Patient limited by pain    Behavior During Therapy WFL for tasks assessed/performed             Past Medical History:  Diagnosis Date   A-fib ([place])    Cellulitis    CHF (congestive heart failure) ([place])    COPD (chronic obstructive pulmonary disease) ([place])    Difficult intubation    Hyperlipidemia    Hypertension    Hypothyroidism    Kidney disease    Sleep apnea    Stroke [facility])    Thyroid disease     Past Surgical History:  Procedure Laterality Date   CARDIAC CATHETERIZATION     CATARACT EXTRACTION W/ INTRAOCULAR LENS  IMPLANT, BILATERAL     COLONOSCOPY  2008   HERNIA REPAIR     bilateral inguinal hernia/ [facility]   HERNIA REPAIR  02/02/2015   18 x 28 cm ventral light mesh placed laparoscopically.   TONSILLECTOMY     UMBILICAL HERNIA REPAIR N/A 02/02/2015   Procedure: HERNIA REPAIR UMBILICAL ADULT;  Surgeon: Robert Bellow, MD;  Location: [facility] ORS;  Service: General;  Laterality: N/A;   VENTRAL HERNIA REPAIR N/A 02/02/2015   Procedure: LAPAROSCOPIC VENTRAL HERNIA;  Surgeon: Robert Bellow, MD;  Location: [facility] ORS;  Service: General;  Laterality: N/A;   vp shunt placement  1979    There were no vitals  filed for this visit.   Subjective Assessment - 12/28/20 1607     Subjective Patient reports no falls or LOB since last session. Had a challenging weekend. Has an aide coming to the house now.    Pertinent History Pt is a 80 y.o. male referred to PT for Physical deconditioning. Pt is known to this clinic and has received rehab here in 2019. Pt has a significant PMH of COPD, A-fib, Aortic Atherosclerosis, ischemic cardiomyopathy, benign hypertension with chronic kidney disease, stage III (CMS-HCC); Essential hypertension; Chronic bronchitis, unspecified chronic bronchitis type (CMS-HCC); OSA (obstructive sleep apnea); Morbid obesity with BMI of 40.0-44.9, adult (CMS-HCC) per last cardiology visit. Pt reports being unable to being able to participate in PT during his stay at [facility] this previous October and also tested positive for COVID-19.  Pt's goal is to focus on gait, balance, LE strength, and walking endurance with RW. Was last seen by this therapist on 05/27/20.    Limitations Standing;House hold activities;Walking;Lifting    How long can you sit comfortably? hour    How long can you stand comfortably? not sure    How long can you walk comfortably? walk with RW to bathroom and back    Patient Stated Goals Improve gait, balance, and LE strength. Reduce  risk of falls.    Currently in Pain? Yes    Pain Score --   generalized soreness   Pain Location Leg    Pain Orientation Right;Left    Pain Descriptors / Indicators Aching    Pain Onset More than a month ago    Pain Frequency Constant                  Goals not performed last session:  2 min walk test :87.1 ft with rolling walker FOTO: 45%  New test: ABC: 20%    Treatment:  Ambulate 86 ft with RW and close CGA  Standing with RW:  -march 12x each LE  Seated: Toe taps to hedgehog 10x each LE IR/ER over hedgehog 10x each LE Heel toe raises 15x   Pt educated throughout session about proper posture and technique  with exercises. Improved exercise technique, movement at target joints, use of target muscles after min to mod verbal, visual, tactile cues.   Patient completed testing that was not finished previous session. Patient does have lack of progress and will be given a trial period. In this trial period patient will have to progress and when testing is repeated have noticeable improvements in order to continue therapy. Patient made aware of process. New goal of ABC added to assess patient stability ands safety with mobility. Patient would benefit from additional skilled PT Intervention to improve strength, balance and mobility            PT Education - 12/28/20 1622     Education provided Yes    Education Details trial recert    Person(s) Educated Patient    Methods Explanation;Demonstration;Tactile cues;Verbal cues    Comprehension Verbalized understanding;Returned demonstration;Verbal cues required;Tactile cues required              PT Short Term Goals - 08/26/20 1154       PT SHORT TERM GOAL #1   Title Pt will be compliant with given HEP to improve LE muscle strength.    Baseline 6/1: HEP added to 7/13: HEP compliant    Time 6    Period Weeks    Status Achieved    Target Date 08/26/20      PT SHORT TERM GOAL #2   Title Patient will stand from standard height chair on first attempt to allow for improved mobility.     Baseline 6/1: unable to perform 7/13: MET    Time 6    Period Weeks    Status Achieved    Target Date 08/26/20               PT Long Term Goals - 12/28/20 1701       PT LONG TERM GOAL #1   Title Pt will improve FOTO goal to 48 to display perceived improvements in functional mobility.    Baseline 6/1: 40% 7/13: 45% 8/23: 40%,9/14: 45% 11/14: 45%    Time 12    Period Weeks    Status Partially Met    Target Date 03/22/21      PT LONG TERM GOAL #2   Title Patient (> 45 years old) will complete five times sit to stand test in < 1 minute indicating  an increased LE strength and improved balance.    Baseline 6/1: 1 min 31 seconds with heavy BUE support 7/13: 49 seconds with heavy BUE support 11/9: 48.5 seconds    Time 12    Period Weeks    Status Achieved  PT LONG TERM GOAL #3   Title Patient will increase BLE gross strength to 4+/5 as to improve functional strength for independent gait, increased standing tolerance and increased ADL ability.    Baseline 6/1: see note 7/13: see note 8/23: grossly 4/5 bilaterally with hip extension 3+/5    Time 12    Period Weeks    Status Partially Met    Target Date 03/22/21      PT LONG TERM GOAL #4   Title Patient will increase 10 meter walk test to >1.0 m/s as to improve gait speed for better community ambulation and to reduce fall risk.    Baseline 6/1: 0.41 m/s with RW 7/13: 0.6 m/s with walker 8/23: 0.6 m/s with walker 9/14: 0.61 m/s with RW 11/9: 0.49 m/s with RW    Time 12    Period Weeks    Status On-going    Target Date 03/22/21      PT LONG TERM GOAL #5   Title Patient will stand without UE support >3 minutes for ADL performance and increased stability with independent movement.    Baseline 6/1: 1 min 7 seconds 7/13: 3 min 3 seconds    Time 12    Period Weeks    Status Achieved      PT LONG TERM GOAL #6   Title Patient will increase Berg Balance score by > 6 points (29/56 )  to demonstrate decreased fall risk during functional activities.    Baseline 8/23: 23/56 11/9: 25/56    Time 12    Period Weeks    Status Partially Met    Target Date 03/22/21      PT LONG TERM GOAL #7   Title Patient will ambulate >300 ft during 2 minute walk test with RW for improved capacity for functional mobility.    Baseline 8/23: 88 ft with frequent standing breaks; 9/14: 133f 11/14: 87.1 ft    Time 12    Period Weeks    Status Partially Met    Target Date 03/22/21      PT LONG TERM GOAL #8   Title Patient will increase ABC scale score >50% to demonstrate better functional mobility and  better confidence with ADLs.    Baseline 11/14: 20%    Time 12    Period Weeks    Status New    Target Date 03/22/21                   Plan - 12/28/20 1638     Clinical Impression Statement Patient completed testing that was not finished previous session. Patient does have lack of progress and will be given a trial period. In this trial period patient will have to progress and when testing is repeated have noticeable improvements in order to continue therapy. Patient made aware of process. New goal of ABC added to assess patient stability ands safety with mobility. Patient would benefit from additional skilled PT Intervention to improve strength, balance and mobility    Personal Factors and Comorbidities Age;Comorbidity 3+;Past/Current Experience;Fitness    Comorbidities OPD, A-fib, Aortic Atherosclerosis, ischemic cardiomyopathy, Benign hypertension with chronic kidney disease, stage III (CMS-HCC); Essential hypertension; Chronic bronchitis, unspecified chronic bronchitis type (CMS-HCC); OSA (obstructive sleep apnea); Morbid obesity with BMI of 40.0-44.9, adult (CMS-HCC)    Examination-Activity Limitations Bend;Carry;Squat;Continence;Stand;Transfers;Locomotion Level;Reach Overhead;Bed Mobility;Lift;Stairs;Toileting;Dressing    Examination-Participation Restrictions Community Activity;Laundry;Yard Work;Cleaning;Driving;Meal Prep;Shop    Stability/Clinical Decision Making Evolving/Moderate complexity    Rehab Potential Fair    Clinical Impairments  Affecting Rehab Potential Cardiorespiratory limitations    PT Frequency 2x / week    PT Duration 12 weeks    PT Treatment/Interventions ADLs/Self Care Home Management;Moist Heat;Cryotherapy;Electrical Stimulation;Functional mobility training;Therapeutic activities;Therapeutic exercise;DME Instruction;Gait training;Balance training;Neuromuscular re-education;Patient/family education;Energy conservation;Biofeedback;Ultrasound;Stair  training;Orthotic Fit/Training;Manual techniques;Manual lymph drainage;Dry needling;Passive range of motion;Taping;Vestibular;Visual/perceptual remediation/compensation    PT Next Visit Plan trial period    PT Home Exercise Plan No updates today    Consulted and Agree with Plan of Care Patient             Patient will benefit from skilled therapeutic intervention in order to improve the following deficits and impairments:  Abnormal gait, Difficulty walking, Cardiopulmonary status limiting activity, Decreased endurance, Impaired UE functional use, Obesity, Decreased activity tolerance, Decreased balance, Improper body mechanics, Decreased mobility, Decreased strength, Impaired sensation, Postural dysfunction, Increased edema, Impaired flexibility, Decreased range of motion, Decreased coordination, Pain  Visit Diagnosis: Abnormality of gait and mobility  Muscle weakness (generalized)  Unsteadiness on feet     Problem List Patient Active Problem List   Diagnosis Date Noted   Atrial fibrillation ([place]) 04/09/2019   Essential hypertension 04/09/2019   Venous stasis dermatitis of both lower extremities 04/09/2019   Lower limb ulcer, ankle, right, limited to breakdown of skin ([place]) 04/09/2019   Rhabdomyolysis 12/10/2018   Sepsis ([place]) 07/28/2018   Cough 03/20/2015   Bronchitis, chronic obstructive w acute bronchitis ([place]) 16/11/9602   Umbilical hernia without obstruction and without gangrene 01/12/2015   Ventral hernia without obstruction or gangrene 01/02/2015   Janna Arch, PT, DPT  12/28/2020, 5:04 PM   [place] [address] [place], [place], [address] Phone: [phone]   Fax:  [phone]  Name: Daniel Eaton. MRN: [id number] Date of Birth: February 05, 1941

## 2020-12-30 ENCOUNTER — Ambulatory Visit: Payer: HMO | Admitting: Physical Therapy

## 2020-12-30 ENCOUNTER — Encounter: Payer: Self-pay | Admitting: Physical Therapy

## 2020-12-30 ENCOUNTER — Other Ambulatory Visit: Payer: Self-pay

## 2020-12-30 DIAGNOSIS — I129 Hypertensive chronic kidney disease with stage 1 through stage 4 chronic kidney disease, or unspecified chronic kidney disease: Secondary | ICD-10-CM | POA: Diagnosis not present

## 2020-12-30 DIAGNOSIS — M6281 Muscle weakness (generalized): Secondary | ICD-10-CM

## 2020-12-30 DIAGNOSIS — R7303 Prediabetes: Secondary | ICD-10-CM | POA: Diagnosis not present

## 2020-12-30 DIAGNOSIS — I255 Ischemic cardiomyopathy: Secondary | ICD-10-CM | POA: Diagnosis not present

## 2020-12-30 DIAGNOSIS — R269 Unspecified abnormalities of gait and mobility: Secondary | ICD-10-CM | POA: Diagnosis not present

## 2020-12-30 DIAGNOSIS — R2681 Unsteadiness on feet: Secondary | ICD-10-CM

## 2020-12-30 DIAGNOSIS — E039 Hypothyroidism, unspecified: Secondary | ICD-10-CM | POA: Diagnosis not present

## 2020-12-30 DIAGNOSIS — R2689 Other abnormalities of gait and mobility: Secondary | ICD-10-CM

## 2020-12-30 DIAGNOSIS — N183 Chronic kidney disease, stage 3 unspecified: Secondary | ICD-10-CM | POA: Diagnosis not present

## 2020-12-30 DIAGNOSIS — R262 Difficulty in walking, not elsewhere classified: Secondary | ICD-10-CM

## 2020-12-30 NOTE — Therapy (Signed)
[place] MAIN [facility] SERVICES [address] [place], [place], [address] Phone: [phone]   Fax:  [phone]  Physical Therapy Treatment  Patient Details  Name: Daniel Eaton. MRN: [id number] Date of Birth: [birth year] Referring Provider (PT): Dr. [name] MD   Encounter Date: 12/30/2020   PT End of Session - 12/30/20 1403     Visit Number 32    Number of Visits 53    Date for PT Re-Evaluation 03/22/21    Authorization Type 1/10 PN 11/9    Authorization Time Period 10/06/20-12/29/20    PT Start Time 1350    PT Stop Time 1435    PT Time Calculation (min) 45 min    Equipment Utilized During Treatment Gait belt    Activity Tolerance Patient limited by fatigue;Patient tolerated treatment well;Patient limited by pain    Behavior During Therapy WFL for tasks assessed/performed             Past Medical History:  Diagnosis Date   A-fib ([place])    Cellulitis    CHF (congestive heart failure) ([place])    COPD (chronic obstructive pulmonary disease) ([place])    Difficult intubation    Hyperlipidemia    Hypertension    Hypothyroidism    Kidney disease    Sleep apnea    Stroke [facility])    Thyroid disease     Past Surgical History:  Procedure Laterality Date   CARDIAC CATHETERIZATION     CATARACT EXTRACTION W/ INTRAOCULAR LENS  IMPLANT, BILATERAL     COLONOSCOPY  2008   HERNIA REPAIR     bilateral inguinal hernia/ [facility]   HERNIA REPAIR  02/02/2015   18 x 28 cm ventral light mesh placed laparoscopically.   TONSILLECTOMY     UMBILICAL HERNIA REPAIR N/A 02/02/2015   Procedure: HERNIA REPAIR UMBILICAL ADULT;  Surgeon: Robert Bellow, MD;  Location: [facility] ORS;  Service: General;  Laterality: N/A;   VENTRAL HERNIA REPAIR N/A 02/02/2015   Procedure: LAPAROSCOPIC VENTRAL HERNIA;  Surgeon: Robert Bellow, MD;  Location: [facility] ORS;  Service: General;  Laterality: N/A;   vp shunt placement  1979    There were no vitals filed  for this visit.   Subjective Assessment - 12/30/20 1354     Subjective Patient reports pain/soreness in entire body. He reports soreness in knee with movement but no pain at rest. He reports right shoulder pain especially when he tries to move it. Reports doing HEP every day. Denies any new falls but occasional stumbles.    Pertinent History Pt is a 80 y.o. male referred to PT for Physical deconditioning. Pt is known to this clinic and has received rehab here in 2019. Pt has a significant PMH of COPD, A-fib, Aortic Atherosclerosis, ischemic cardiomyopathy, benign hypertension with chronic kidney disease, stage III (CMS-HCC); Essential hypertension; Chronic bronchitis, unspecified chronic bronchitis type (CMS-HCC); OSA (obstructive sleep apnea); Morbid obesity with BMI of 40.0-44.9, adult (CMS-HCC) per last cardiology visit. Pt reports being unable to being able to participate in PT during his stay at [facility] this previous October and also tested positive for COVID-19.  Pt's goal is to focus on gait, balance, LE strength, and walking endurance with RW. Was last seen by this therapist on 05/27/20.    Limitations Standing;House hold activities;Walking;Lifting    How long can you sit comfortably? hour    How long can you stand comfortably? not sure    How long can you walk  comfortably? walk with RW to bathroom and back    Patient Stated Goals Improve gait, balance, and LE strength. Reduce risk of falls.    Pain Score --   generalized soreness;   Pain Location Knee    Pain Orientation Right;Left    Pain Descriptors / Indicators Aching    Pain Type Chronic pain    Pain Onset More than a month ago    Pain Frequency Intermittent    Aggravating Factors  weight bearing    Pain Relieving Factors rest    Effect of Pain on Daily Activities decreased activity tolerance;    Multiple Pain Sites No              Treatment:  Warm up on Nustep BUE/BLE, seat #13, level 2-3 x4 min with cues to  increase steps per minute >60 for cardiovascular conditioning; Vitals after Nustep, HR 93, Spo2 99%  Ambulate 12 feet x2 reps, 86 ft with RW and close CGA  Standing in parallel bars: PT applied 2# ankle weights with pillowcase liner to help reduce skin breakdown: Standing: -alternate march with BUE rail assist x10 reps each -hip abduction x10 reps each LE  Patient required rail assist for safety and balance. He also reports increased shortness of breath during session requiring short intermittent rest breaks. Vitals after standing exercise, SPO2 100%, HR 122 bpm  Patient requires increased time for transfers with decreased forward weight shift and heavy UE use;  Seated: Alternate LAQ 2# x15 reps each LE Hip abduction/ER green tband x15 reps Attempted forward lean in chair to tap cone in parallel bars, but patient unable to tolerate with increased shoulder discomfort Attempted partial sit<>Stand but patient reports increased UE pain after 2 reps with heavy UE use. Patient unable to shift weight forward enough for increased LE weight bearing;   Pt educated throughout session about proper posture and technique with exercises. Improved exercise technique, movement at target joints, use of target muscles after min to mod verbal, visual, tactile cues.    Patient continues to be significantly limited in mobility due to UE fatigue/pain, LE weakness and shortness of breath. He required several seated rest breaks. Patient's vitals monitored throughout Reinforced HEP;                       PT Education - 12/30/20 1403     Education provided Yes    Education Details LE strengthening, positioning; HEP reinforced;    Person(s) Educated Patient    Methods Explanation;Verbal cues    Comprehension Verbalized understanding;Returned demonstration;Verbal cues required;Need further instruction              PT Short Term Goals - 08/26/20 1154       PT SHORT TERM GOAL #1    Title Pt will be compliant with given HEP to improve LE muscle strength.    Baseline 6/1: HEP added to 7/13: HEP compliant    Time 6    Period Weeks    Status Achieved    Target Date 08/26/20      PT SHORT TERM GOAL #2   Title Patient will stand from standard height chair on first attempt to allow for improved mobility.     Baseline 6/1: unable to perform 7/13: MET    Time 6    Period Weeks    Status Achieved    Target Date 08/26/20               PT Long  Term Goals - 12/28/20 1701       PT LONG TERM GOAL #1   Title Pt will improve FOTO goal to 48 to display perceived improvements in functional mobility.    Baseline 6/1: 40% 7/13: 45% 8/23: 40%,9/14: 45% 11/14: 45%    Time 12    Period Weeks    Status Partially Met    Target Date 03/22/21      PT LONG TERM GOAL #2   Title Patient (> 72 years old) will complete five times sit to stand test in < 1 minute indicating an increased LE strength and improved balance.    Baseline 6/1: 1 min 31 seconds with heavy BUE support 7/13: 49 seconds with heavy BUE support 11/9: 48.5 seconds    Time 12    Period Weeks    Status Achieved      PT LONG TERM GOAL #3   Title Patient will increase BLE gross strength to 4+/5 as to improve functional strength for independent gait, increased standing tolerance and increased ADL ability.    Baseline 6/1: see note 7/13: see note 8/23: grossly 4/5 bilaterally with hip extension 3+/5    Time 12    Period Weeks    Status Partially Met    Target Date 03/22/21      PT LONG TERM GOAL #4   Title Patient will increase 10 meter walk test to >1.0 m/s as to improve gait speed for better community ambulation and to reduce fall risk.    Baseline 6/1: 0.41 m/s with RW 7/13: 0.6 m/s with walker 8/23: 0.6 m/s with walker 9/14: 0.61 m/s with RW 11/9: 0.49 m/s with RW    Time 12    Period Weeks    Status On-going    Target Date 03/22/21      PT LONG TERM GOAL #5   Title Patient will stand without UE support  >3 minutes for ADL performance and increased stability with independent movement.    Baseline 6/1: 1 min 7 seconds 7/13: 3 min 3 seconds    Time 12    Period Weeks    Status Achieved      PT LONG TERM GOAL #6   Title Patient will increase Berg Balance score by > 6 points (29/56 )  to demonstrate decreased fall risk during functional activities.    Baseline 8/23: 23/56 11/9: 25/56    Time 12    Period Weeks    Status Partially Met    Target Date 03/22/21      PT LONG TERM GOAL #7   Title Patient will ambulate >300 ft during 2 minute walk test with RW for improved capacity for functional mobility.    Baseline 8/23: 88 ft with frequent standing breaks; 9/14: 130f 11/14: 87.1 ft    Time 12    Period Weeks    Status Partially Met    Target Date 03/22/21      PT LONG TERM GOAL #8   Title Patient will increase ABC scale score >50% to demonstrate better functional mobility and better confidence with ADLs.    Baseline 11/14: 20%    Time 12    Period Weeks    Status New    Target Date 03/22/21                   Plan - 12/30/20 1418     Clinical Impression Statement Patient motivated and participated fair within session. He was instructed in advanced LE strengthening  exercise. utilized ankle weight for resistance. He did require pillowcase liner to reduce skin breakdown. Patient does fatigue quickly requiring short intermittent rest breaks. Vitals monitored, SPo2 levels are WNL, HR elevated with minimal activity. Patient does require increased time for sit<>Stand transfer with heavy UE use and decreased forward weight shift. He would benefit from additional skilled PT Intervention to improve strength and mobility;    Personal Factors and Comorbidities Age;Comorbidity 3+;Past/Current Experience;Fitness    Comorbidities OPD, A-fib, Aortic Atherosclerosis, ischemic cardiomyopathy, Benign hypertension with chronic kidney disease, stage III (CMS-HCC); Essential hypertension; Chronic  bronchitis, unspecified chronic bronchitis type (CMS-HCC); OSA (obstructive sleep apnea); Morbid obesity with BMI of 40.0-44.9, adult (CMS-HCC)    Examination-Activity Limitations Bend;Carry;Squat;Continence;Stand;Transfers;Locomotion Level;Reach Overhead;Bed Mobility;Lift;Stairs;Toileting;Dressing    Examination-Participation Restrictions Community Activity;Laundry;Yard Work;Cleaning;Driving;Meal Prep;Shop    Stability/Clinical Decision Making Evolving/Moderate complexity    Rehab Potential Fair    Clinical Impairments Affecting Rehab Potential Cardiorespiratory limitations    PT Frequency 2x / week    PT Duration 12 weeks    PT Treatment/Interventions ADLs/Self Care Home Management;Moist Heat;Cryotherapy;Electrical Stimulation;Functional mobility training;Therapeutic activities;Therapeutic exercise;DME Instruction;Gait training;Balance training;Neuromuscular re-education;Patient/family education;Energy conservation;Biofeedback;Ultrasound;Stair training;Orthotic Fit/Training;Manual techniques;Manual lymph drainage;Dry needling;Passive range of motion;Taping;Vestibular;Visual/perceptual remediation/compensation    PT Next Visit Plan trial period    PT Home Exercise Plan No updates today    Consulted and Agree with Plan of Care Patient             Patient will benefit from skilled therapeutic intervention in order to improve the following deficits and impairments:  Abnormal gait, Difficulty walking, Cardiopulmonary status limiting activity, Decreased endurance, Impaired UE functional use, Obesity, Decreased activity tolerance, Decreased balance, Improper body mechanics, Decreased mobility, Decreased strength, Impaired sensation, Postural dysfunction, Increased edema, Impaired flexibility, Decreased range of motion, Decreased coordination, Pain  Visit Diagnosis: Muscle weakness (generalized)  Unsteadiness on feet  Difficulty in walking, not elsewhere classified  Other abnormalities of gait  and mobility     Problem List Patient Active Problem List   Diagnosis Date Noted   Atrial fibrillation ([place]) 04/09/2019   Essential hypertension 04/09/2019   Venous stasis dermatitis of both lower extremities 04/09/2019   Lower limb ulcer, ankle, right, limited to breakdown of skin ([place]) 04/09/2019   Rhabdomyolysis 12/10/2018   Sepsis ([place]) 07/28/2018   Cough 03/20/2015   Bronchitis, chronic obstructive w acute bronchitis (HCC) 76/28/3151   Umbilical hernia without obstruction and without gangrene 01/12/2015   Ventral hernia without obstruction or gangrene 01/02/2015    Aliyana Dlugosz, PT, DPT 12/30/2020, 2:46 PM  [place] [address] [place], [place], [address] Phone: [phone]   Fax:  [phone]  Name: Edger Husain. MRN: [id number] Date of Birth: 04-May-1940

## 2021-01-04 ENCOUNTER — Ambulatory Visit: Payer: HMO

## 2021-01-04 IMAGING — DX PORTABLE CHEST - 1 VIEW
1 series · 1 of 1 positions shown · non-contrast
Comparison: 03/01/2016

CLINICAL DATA: Fever, weakness, and myalgias for 1 week. Atrial
fibrillation. COPD.

EXAM:
PORTABLE CHEST 1 VIEW

[chest ap]
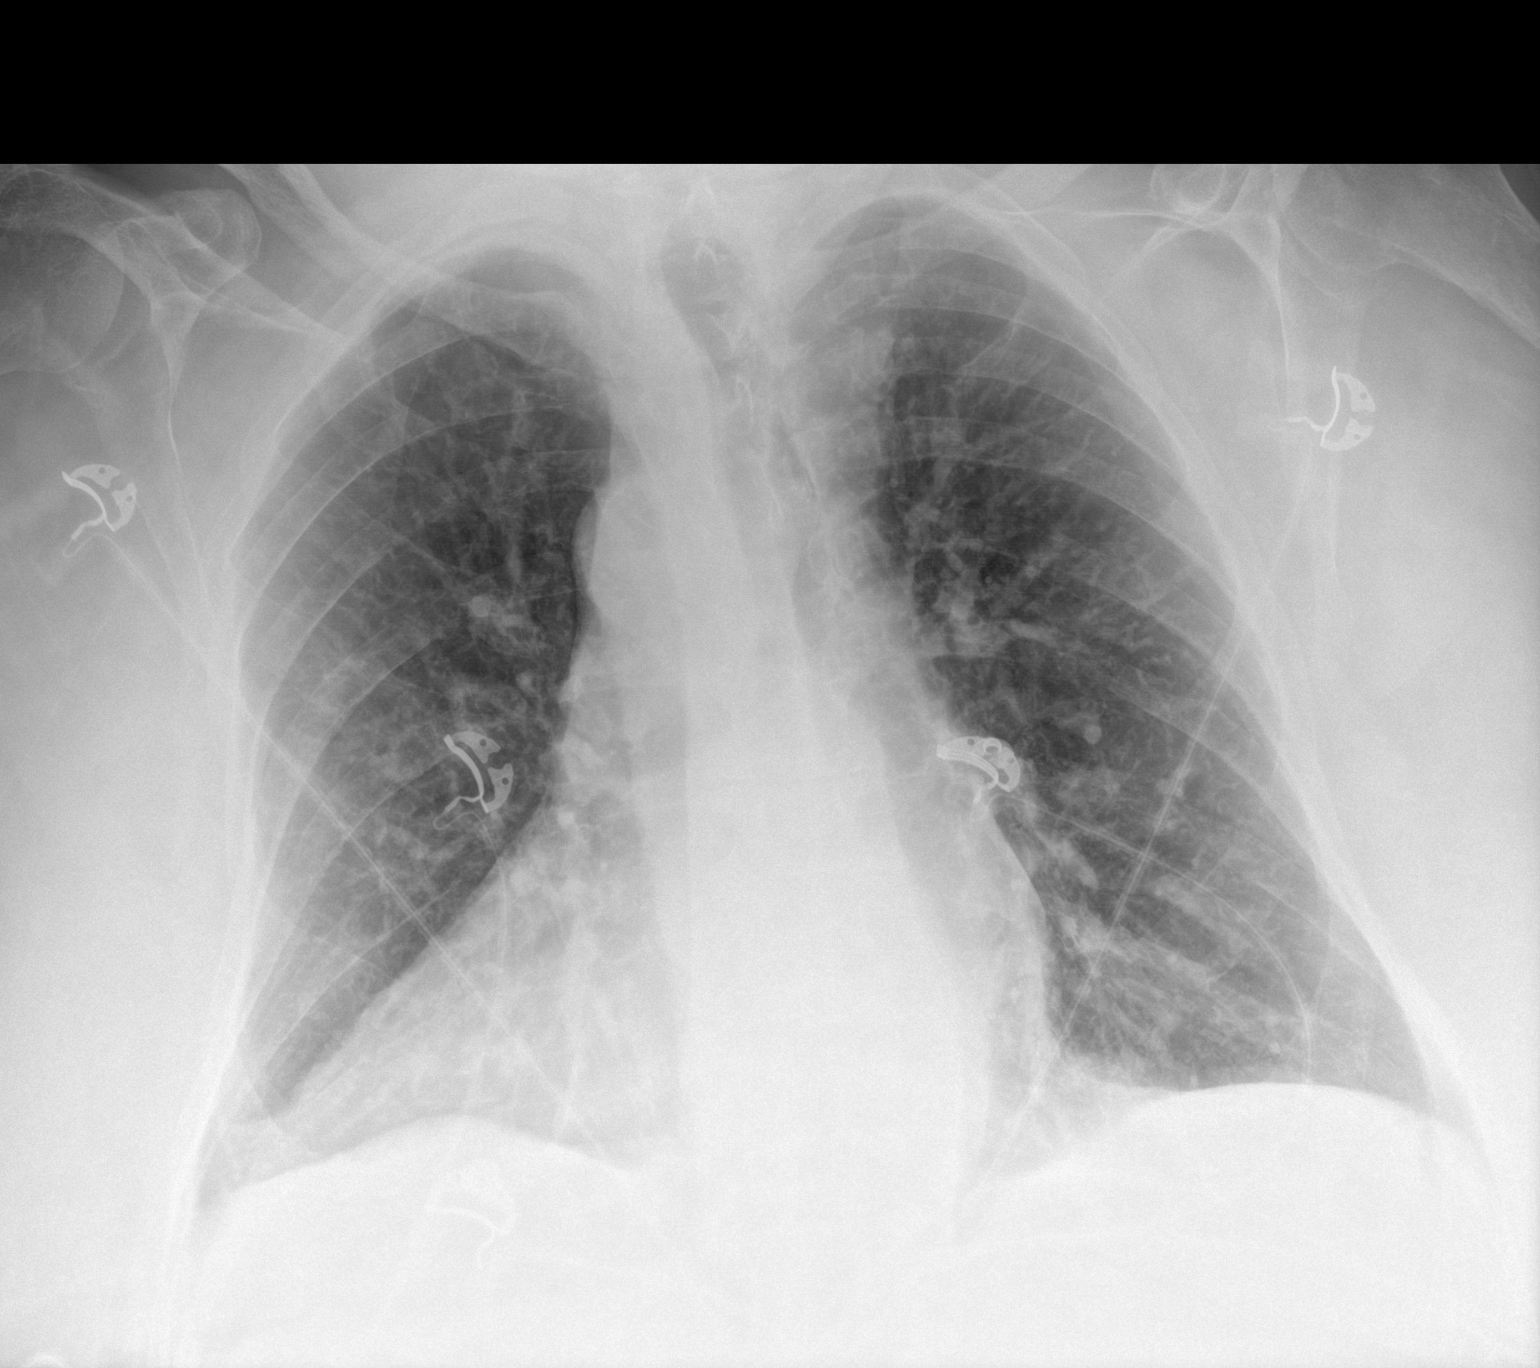

[1 of 1 positions shown; findings below may reference images not displayed]

FINDINGS: Stable mild-to-moderate cardiomegaly. Both lungs are well aerated
and clear. No evidence of pleural effusion.
IMPRESSION: Stable cardiomegaly. No active lung disease.

## 2021-01-06 ENCOUNTER — Ambulatory Visit: Payer: HMO

## 2021-01-06 DIAGNOSIS — G4733 Obstructive sleep apnea (adult) (pediatric): Secondary | ICD-10-CM | POA: Diagnosis not present

## 2021-01-06 DIAGNOSIS — I129 Hypertensive chronic kidney disease with stage 1 through stage 4 chronic kidney disease, or unspecified chronic kidney disease: Secondary | ICD-10-CM | POA: Diagnosis not present

## 2021-01-06 DIAGNOSIS — E876 Hypokalemia: Secondary | ICD-10-CM | POA: Diagnosis not present

## 2021-01-06 DIAGNOSIS — M791 Myalgia, unspecified site: Secondary | ICD-10-CM | POA: Diagnosis not present

## 2021-01-06 DIAGNOSIS — E039 Hypothyroidism, unspecified: Secondary | ICD-10-CM | POA: Diagnosis not present

## 2021-01-06 DIAGNOSIS — R7303 Prediabetes: Secondary | ICD-10-CM | POA: Diagnosis not present

## 2021-01-06 DIAGNOSIS — J42 Unspecified chronic bronchitis: Secondary | ICD-10-CM | POA: Diagnosis not present

## 2021-01-06 DIAGNOSIS — I700 Atherosclerosis of aorta: Secondary | ICD-10-CM | POA: Diagnosis not present

## 2021-01-06 DIAGNOSIS — I255 Ischemic cardiomyopathy: Secondary | ICD-10-CM | POA: Diagnosis not present

## 2021-01-06 DIAGNOSIS — N183 Chronic kidney disease, stage 3 unspecified: Secondary | ICD-10-CM | POA: Diagnosis not present

## 2021-01-06 DIAGNOSIS — I4819 Other persistent atrial fibrillation: Secondary | ICD-10-CM | POA: Diagnosis not present

## 2021-01-11 ENCOUNTER — Ambulatory Visit: Payer: HMO

## 2021-01-11 ENCOUNTER — Other Ambulatory Visit: Payer: Self-pay

## 2021-01-11 DIAGNOSIS — M6281 Muscle weakness (generalized): Secondary | ICD-10-CM

## 2021-01-11 DIAGNOSIS — R269 Unspecified abnormalities of gait and mobility: Secondary | ICD-10-CM | POA: Diagnosis not present

## 2021-01-11 DIAGNOSIS — R2689 Other abnormalities of gait and mobility: Secondary | ICD-10-CM

## 2021-01-11 DIAGNOSIS — R2681 Unsteadiness on feet: Secondary | ICD-10-CM

## 2021-01-11 NOTE — Therapy (Signed)
[place] MAIN [facility] SERVICES [address] [place], [place], [address] Phone: [phone]   Fax:  [phone]  Physical Therapy Treatment  Patient Details  Name: Daniel Eaton. MRN: [id number] Date of Birth: 04-19-1940 Referring Provider (PT): Dr. [name] MD   Encounter Date: 01/11/2021   PT End of Session - 01/11/21 1613     Visit Number 33    Number of Visits 58    Date for PT Re-Evaluation 03/22/21    Authorization Type 3/10 PN 11/9    Authorization Time Period 10/06/20-12/29/20    PT Start Time 1606    PT Stop Time 1645    PT Time Calculation (min) 39 min    Equipment Utilized During Treatment Gait belt    Activity Tolerance Patient limited by fatigue;Patient tolerated treatment well;Patient limited by pain    Behavior During Therapy WFL for tasks assessed/performed             Past Medical History:  Diagnosis Date   A-fib ([place])    Cellulitis    CHF (congestive heart failure) ([place])    COPD (chronic obstructive pulmonary disease) ([place])    Difficult intubation    Hyperlipidemia    Hypertension    Hypothyroidism    Kidney disease    Sleep apnea    Stroke [facility])    Thyroid disease     Past Surgical History:  Procedure Laterality Date   CARDIAC CATHETERIZATION     CATARACT EXTRACTION W/ INTRAOCULAR LENS  IMPLANT, BILATERAL     COLONOSCOPY  2008   HERNIA REPAIR     bilateral inguinal hernia/ [facility]   HERNIA REPAIR  02/02/2015   18 x 28 cm ventral light mesh placed laparoscopically.   TONSILLECTOMY     UMBILICAL HERNIA REPAIR N/A 02/02/2015   Procedure: HERNIA REPAIR UMBILICAL ADULT;  Surgeon: Robert Bellow, MD;  Location: [facility] ORS;  Service: General;  Laterality: N/A;   VENTRAL HERNIA REPAIR N/A 02/02/2015   Procedure: LAPAROSCOPIC VENTRAL HERNIA;  Surgeon: Robert Bellow, MD;  Location: [facility] ORS;  Service: General;  Laterality: N/A;   vp shunt placement  1979    There were no vitals filed  for this visit.   Subjective Assessment - 01/11/21 1612     Subjective Patient reports compliance with most of his HEP except the sit to stands. No falls or LOB since last session. Saw doctor last week and is supposed to "stay the course".    Pertinent History Pt is a 80 y.o. male referred to PT for Physical deconditioning. Pt is known to this clinic and has received rehab here in 2019. Pt has a significant PMH of COPD, A-fib, Aortic Atherosclerosis, ischemic cardiomyopathy, benign hypertension with chronic kidney disease, stage III (CMS-HCC); Essential hypertension; Chronic bronchitis, unspecified chronic bronchitis type (CMS-HCC); OSA (obstructive sleep apnea); Morbid obesity with BMI of 40.0-44.9, adult (CMS-HCC) per last cardiology visit. Pt reports being unable to being able to participate in PT during his stay at [facility] this previous October and also tested positive for COVID-19.  Pt's goal is to focus on gait, balance, LE strength, and walking endurance with RW. Was last seen by this therapist on 05/27/20.    Limitations Standing;House hold activities;Walking;Lifting    How long can you sit comfortably? hour    How long can you stand comfortably? not sure    How long can you walk comfortably? walk with RW to bathroom and back  Patient Stated Goals Improve gait, balance, and LE strength. Reduce risk of falls.    Currently in Pain? Yes    Pain Score --   generalized pain   Pain Type Chronic pain    Pain Onset More than a month ago    Pain Frequency Intermittent                 Treatment:  Warm up on Nustep BUE/BLE, seat #13, level 2-4 x4 min with cues to increase steps per minute >60 for cardiovascular conditioning; Vitals after Nustep, HR 102, Spo2 96%   ambulate 86 ft with RW and close CGA; cues for upright posture.   Standing in parallel bars:  Standing: -alternate march with BUE rail assist x10 reps each LE into band across // bars  Lateral stepping 4x length of  // bars with occasional standing moments to catch breath due to SOB.  Patient required rail assist for safety and balance. He also reports increased shortness of breath during session requiring short intermittent rest breaks.    Patient requires increased time for transfers with decreased forward weight shift and heavy UE use;   Seated: Sit to stand 5x ; improving compared to previous sessions Hamstring isometric press into dynadisc 10x 5 second holds     Pt educated throughout session about proper posture and technique with exercises. Improved exercise technique, movement at target joints, use of target muscles after min to mod verbal, visual, tactile cues.     Patient presents with excellent motivation throughout physical therapy session. Shoulder and LE pain noted throughout session. Patient requires his vitals to be monitored throughout session due to elevated HR after interventions. Patient requires increased time for transfers with decreased forward weight shift and heavy UE use but are improving compared to previous sessions. He would benefit from additional skilled PT Intervention to improve strength and mobility;                 PT Education - 01/11/21 1613     Education provided Yes    Education Details exercise technique, body mechanics    Person(s) Educated Patient    Methods Explanation;Demonstration;Tactile cues;Verbal cues    Comprehension Verbalized understanding;Returned demonstration;Verbal cues required;Tactile cues required              PT Short Term Goals - 08/26/20 1154       PT SHORT TERM GOAL #1   Title Pt will be compliant with given HEP to improve LE muscle strength.    Baseline 6/1: HEP added to 7/13: HEP compliant    Time 6    Period Weeks    Status Achieved    Target Date 08/26/20      PT SHORT TERM GOAL #2   Title Patient will stand from standard height chair on first attempt to allow for improved mobility.     Baseline 6/1:  unable to perform 7/13: MET    Time 6    Period Weeks    Status Achieved    Target Date 08/26/20               PT Long Term Goals - 12/28/20 1701       PT LONG TERM GOAL #1   Title Pt will improve FOTO goal to 48 to display perceived improvements in functional mobility.    Baseline 6/1: 40% 7/13: 45% 8/23: 40%,9/14: 45% 11/14: 45%    Time 12    Period Weeks    Status Partially Met  Target Date 03/22/21      PT LONG TERM GOAL #2   Title Patient (> 65 years old) will complete five times sit to stand test in < 1 minute indicating an increased LE strength and improved balance.    Baseline 6/1: 1 min 31 seconds with heavy BUE support 7/13: 49 seconds with heavy BUE support 11/9: 48.5 seconds    Time 12    Period Weeks    Status Achieved      PT LONG TERM GOAL #3   Title Patient will increase BLE gross strength to 4+/5 as to improve functional strength for independent gait, increased standing tolerance and increased ADL ability.    Baseline 6/1: see note 7/13: see note 8/23: grossly 4/5 bilaterally with hip extension 3+/5    Time 12    Period Weeks    Status Partially Met    Target Date 03/22/21      PT LONG TERM GOAL #4   Title Patient will increase 10 meter walk test to >1.0 m/s as to improve gait speed for better community ambulation and to reduce fall risk.    Baseline 6/1: 0.41 m/s with RW 7/13: 0.6 m/s with walker 8/23: 0.6 m/s with walker 9/14: 0.61 m/s with RW 11/9: 0.49 m/s with RW    Time 12    Period Weeks    Status On-going    Target Date 03/22/21      PT LONG TERM GOAL #5   Title Patient will stand without UE support >3 minutes for ADL performance and increased stability with independent movement.    Baseline 6/1: 1 min 7 seconds 7/13: 3 min 3 seconds    Time 12    Period Weeks    Status Achieved      PT LONG TERM GOAL #6   Title Patient will increase Berg Balance score by > 6 points (29/56 )  to demonstrate decreased fall risk during functional  activities.    Baseline 8/23: 23/56 11/9: 25/56    Time 12    Period Weeks    Status Partially Met    Target Date 03/22/21      PT LONG TERM GOAL #7   Title Patient will ambulate >300 ft during 2 minute walk test with RW for improved capacity for functional mobility.    Baseline 8/23: 88 ft with frequent standing breaks; 9/14: 127f 11/14: 87.1 ft    Time 12    Period Weeks    Status Partially Met    Target Date 03/22/21      PT LONG TERM GOAL #8   Title Patient will increase ABC scale score >50% to demonstrate better functional mobility and better confidence with ADLs.    Baseline 11/14: 20%    Time 12    Period Weeks    Status New    Target Date 03/22/21                   Plan - 01/11/21 1637     Clinical Impression Statement Patient presents with excellent motivation throughout physical therapy session. Shoulder and LE pain noted throughout session. Patient requires his vitals to be monitored throughout session due to elevated HR after interventions. Patient requires increased time for transfers with decreased forward weight shift and heavy UE use but are improving compared to previous sessions. He would benefit from additional skilled PT Intervention to improve strength and mobility;    Personal Factors and Comorbidities Age;Comorbidity 3+;Past/Current Experience;Fitness  Comorbidities OPD, A-fib, Aortic Atherosclerosis, ischemic cardiomyopathy, Benign hypertension with chronic kidney disease, stage III (CMS-HCC); Essential hypertension; Chronic bronchitis, unspecified chronic bronchitis type (CMS-HCC); OSA (obstructive sleep apnea); Morbid obesity with BMI of 40.0-44.9, adult (CMS-HCC)    Examination-Activity Limitations Bend;Carry;Squat;Continence;Stand;Transfers;Locomotion Level;Reach Overhead;Bed Mobility;Lift;Stairs;Toileting;Dressing    Examination-Participation Restrictions Community Activity;Laundry;Yard Work;Cleaning;Driving;Meal Prep;Shop    Stability/Clinical  Decision Making Evolving/Moderate complexity    Rehab Potential Fair    Clinical Impairments Affecting Rehab Potential Cardiorespiratory limitations    PT Frequency 2x / week    PT Duration 12 weeks    PT Treatment/Interventions ADLs/Self Care Home Management;Moist Heat;Cryotherapy;Electrical Stimulation;Functional mobility training;Therapeutic activities;Therapeutic exercise;DME Instruction;Gait training;Balance training;Neuromuscular re-education;Patient/family education;Energy conservation;Biofeedback;Ultrasound;Stair training;Orthotic Fit/Training;Manual techniques;Manual lymph drainage;Dry needling;Passive range of motion;Taping;Vestibular;Visual/perceptual remediation/compensation    PT Next Visit Plan trial period    PT Home Exercise Plan No updates today    Consulted and Agree with Plan of Care Patient             Patient will benefit from skilled therapeutic intervention in order to improve the following deficits and impairments:  Abnormal gait, Difficulty walking, Cardiopulmonary status limiting activity, Decreased endurance, Impaired UE functional use, Obesity, Decreased activity tolerance, Decreased balance, Improper body mechanics, Decreased mobility, Decreased strength, Impaired sensation, Postural dysfunction, Increased edema, Impaired flexibility, Decreased range of motion, Decreased coordination, Pain  Visit Diagnosis: Muscle weakness (generalized)  Unsteadiness on feet  Other abnormalities of gait and mobility     Problem List Patient Active Problem List   Diagnosis Date Noted   Atrial fibrillation ([place]) 04/09/2019   Essential hypertension 04/09/2019   Venous stasis dermatitis of both lower extremities 04/09/2019   Lower limb ulcer, ankle, right, limited to breakdown of skin ([place]) 04/09/2019   Rhabdomyolysis 12/10/2018   Sepsis ([place]) 07/28/2018   Cough 03/20/2015   Bronchitis, chronic obstructive w acute bronchitis (HCC) 12/45/8099   Umbilical hernia without  obstruction and without gangrene 01/12/2015   Ventral hernia without obstruction or gangrene 01/02/2015    Janna Arch, PT, DPT  01/11/2021, 4:55 PM  [facility] [address] [place], [place], [address] Phone: [phone]   Fax:  [phone]  Name: Daniel Eaton. MRN: [id number] Date of Birth: 03/09/1940

## 2021-01-13 ENCOUNTER — Other Ambulatory Visit: Payer: Self-pay

## 2021-01-13 ENCOUNTER — Ambulatory Visit: Payer: HMO

## 2021-01-13 DIAGNOSIS — M6281 Muscle weakness (generalized): Secondary | ICD-10-CM

## 2021-01-13 DIAGNOSIS — R2681 Unsteadiness on feet: Secondary | ICD-10-CM

## 2021-01-13 DIAGNOSIS — R2689 Other abnormalities of gait and mobility: Secondary | ICD-10-CM

## 2021-01-13 DIAGNOSIS — R269 Unspecified abnormalities of gait and mobility: Secondary | ICD-10-CM | POA: Diagnosis not present

## 2021-01-13 NOTE — Therapy (Signed)
[place] MAIN [facility] SERVICES [address] [place], [place], [address] Phone: [phone]   Fax:  [phone]  Physical Therapy Treatment  Patient Details  Name: Daniel Eaton. MRN: [id number] Date of Birth: [birth year] Referring Provider (PT): Dr. [name] MD   Encounter Date: 01/13/2021   PT End of Session - 01/13/21 1410     Visit Number 34    Number of Visits 64    Date for PT Re-Evaluation 03/22/21    Authorization Type 4/10 PN 11/9    Authorization Time Period 10/06/20-12/29/20    PT Start Time 1354    PT Stop Time 1430    PT Time Calculation (min) 36 min    Equipment Utilized During Treatment Gait belt    Activity Tolerance Patient limited by fatigue;Patient tolerated treatment well;Patient limited by pain    Behavior During Therapy WFL for tasks assessed/performed             Past Medical History:  Diagnosis Date   A-fib ([place])    Cellulitis    CHF (congestive heart failure) ([place])    COPD (chronic obstructive pulmonary disease) ([place])    Difficult intubation    Hyperlipidemia    Hypertension    Hypothyroidism    Kidney disease    Sleep apnea    Stroke [facility])    Thyroid disease     Past Surgical History:  Procedure Laterality Date   CARDIAC CATHETERIZATION     CATARACT EXTRACTION W/ INTRAOCULAR LENS  IMPLANT, BILATERAL     COLONOSCOPY  2008   HERNIA REPAIR     bilateral inguinal hernia/ [facility]   HERNIA REPAIR  02/02/2015   18 x 28 cm ventral light mesh placed laparoscopically.   TONSILLECTOMY     UMBILICAL HERNIA REPAIR N/A 02/02/2015   Procedure: HERNIA REPAIR UMBILICAL ADULT;  Surgeon: Robert Bellow, MD;  Location: [facility] ORS;  Service: General;  Laterality: N/A;   VENTRAL HERNIA REPAIR N/A 02/02/2015   Procedure: LAPAROSCOPIC VENTRAL HERNIA;  Surgeon: Robert Bellow, MD;  Location: [facility] ORS;  Service: General;  Laterality: N/A;   vp shunt placement  1979    There were no vitals filed  for this visit.   Subjective Assessment - 01/13/21 1406     Subjective Patient slightly late to PT session. Reports he is feeling rough from his diaruetic.    Pertinent History Pt is a 80 y.o. male referred to PT for Physical deconditioning. Pt is known to this clinic and has received rehab here in 2019. Pt has a significant PMH of COPD, A-fib, Aortic Atherosclerosis, ischemic cardiomyopathy, benign hypertension with chronic kidney disease, stage III (CMS-HCC); Essential hypertension; Chronic bronchitis, unspecified chronic bronchitis type (CMS-HCC); OSA (obstructive sleep apnea); Morbid obesity with BMI of 40.0-44.9, adult (CMS-HCC) per last cardiology visit. Pt reports being unable to being able to participate in PT during his stay at [facility] this previous October and also tested positive for COVID-19.  Pt's goal is to focus on gait, balance, LE strength, and walking endurance with RW. Was last seen by this therapist on 05/27/20.    Limitations Standing;House hold activities;Walking;Lifting    How long can you sit comfortably? hour    How long can you stand comfortably? not sure    How long can you walk comfortably? walk with RW to bathroom and back    Patient Stated Goals Improve gait, balance, and LE strength. Reduce risk of falls.    Currently  in Pain? No/denies             Treatment:   SuperSet 1: Sit to stand from raised plinth table 6x; x2 trials Seated 2.5 ankle weight: march 10x ; x 2 trials   SuperSet 2:  Sit to stand to stand 6x from raised plinth table; second set with 12 reps LAQ with 2.5 ankle weight 10x each LE  HR to 108  Seated: Heel raise with 2.5 ankle weights 15x  alternating step out/in over hedgehog 10x each LE with 2.5 ankle weight -terminated due to back pain  -adduction ball squeeze 15x 3 seconds  -adduction rainbow ball between feet with LAQ 5x; very challenging for patient    Pt educated throughout session about proper posture and technique  with exercises. Improved exercise technique, movement at target joints, use of target muscles after min to mod verbal, visual, tactile cues.  Patient session limited by late arrival. He demonstrated improved sit to stand technique from raised plinth table with decreased reliance upon Ue's and momentum. He has improved LE recruitment patterning this session with decreased need for rest breaks. He would benefit from additional skilled PT Intervention to improve strength and mobility;                PT Education - 01/13/21 1405     Education provided Yes    Education Details exercise technique, body mechanics    Person(s) Educated Patient    Methods Explanation;Demonstration;Tactile cues;Verbal cues    Comprehension Verbalized understanding;Returned demonstration;Verbal cues required;Tactile cues required              PT Short Term Goals - 08/26/20 1154       PT SHORT TERM GOAL #1   Title Pt will be compliant with given HEP to improve LE muscle strength.    Baseline 6/1: HEP added to 7/13: HEP compliant    Time 6    Period Weeks    Status Achieved    Target Date 08/26/20      PT SHORT TERM GOAL #2   Title Patient will stand from standard height chair on first attempt to allow for improved mobility.     Baseline 6/1: unable to perform 7/13: MET    Time 6    Period Weeks    Status Achieved    Target Date 08/26/20               PT Long Term Goals - 12/28/20 1701       PT LONG TERM GOAL #1   Title Pt will improve FOTO goal to 48 to display perceived improvements in functional mobility.    Baseline 6/1: 40% 7/13: 45% 8/23: 40%,9/14: 45% 11/14: 45%    Time 12    Period Weeks    Status Partially Met    Target Date 03/22/21      PT LONG TERM GOAL #2   Title Patient (> 31 years old) will complete five times sit to stand test in < 1 minute indicating an increased LE strength and improved balance.    Baseline 6/1: 1 min 31 seconds with heavy BUE support 7/13:  49 seconds with heavy BUE support 11/9: 48.5 seconds    Time 12    Period Weeks    Status Achieved      PT LONG TERM GOAL #3   Title Patient will increase BLE gross strength to 4+/5 as to improve functional strength for independent gait, increased standing tolerance and increased ADL ability.  Baseline 6/1: see note 7/13: see note 8/23: grossly 4/5 bilaterally with hip extension 3+/5    Time 12    Period Weeks    Status Partially Met    Target Date 03/22/21      PT LONG TERM GOAL #4   Title Patient will increase 10 meter walk test to >1.0 m/s as to improve gait speed for better community ambulation and to reduce fall risk.    Baseline 6/1: 0.41 m/s with RW 7/13: 0.6 m/s with walker 8/23: 0.6 m/s with walker 9/14: 0.61 m/s with RW 11/9: 0.49 m/s with RW    Time 12    Period Weeks    Status On-going    Target Date 03/22/21      PT LONG TERM GOAL #5   Title Patient will stand without UE support >3 minutes for ADL performance and increased stability with independent movement.    Baseline 6/1: 1 min 7 seconds 7/13: 3 min 3 seconds    Time 12    Period Weeks    Status Achieved      PT LONG TERM GOAL #6   Title Patient will increase Berg Balance score by > 6 points (29/56 )  to demonstrate decreased fall risk during functional activities.    Baseline 8/23: 23/56 11/9: 25/56    Time 12    Period Weeks    Status Partially Met    Target Date 03/22/21      PT LONG TERM GOAL #7   Title Patient will ambulate >300 ft during 2 minute walk test with RW for improved capacity for functional mobility.    Baseline 8/23: 88 ft with frequent standing breaks; 9/14: 156f 11/14: 87.1 ft    Time 12    Period Weeks    Status Partially Met    Target Date 03/22/21      PT LONG TERM GOAL #8   Title Patient will increase ABC scale score >50% to demonstrate better functional mobility and better confidence with ADLs.    Baseline 11/14: 20%    Time 12    Period Weeks    Status New    Target Date  03/22/21                   Plan - 01/13/21 1417     Clinical Impression Statement Patient session limited by late arrival. He demonstrated improved sit to stand technique from raised plinth table with decreased reliance upon Ue's and momentum. He has improved LE recruitment patterning this session with decreased need for rest breaks. He would benefit from additional skilled PT Intervention to improve strength and mobility;    Personal Factors and Comorbidities Age;Comorbidity 3+;Past/Current Experience;Fitness    Comorbidities OPD, A-fib, Aortic Atherosclerosis, ischemic cardiomyopathy, Benign hypertension with chronic kidney disease, stage III (CMS-HCC); Essential hypertension; Chronic bronchitis, unspecified chronic bronchitis type (CMS-HCC); OSA (obstructive sleep apnea); Morbid obesity with BMI of 40.0-44.9, adult (CMS-HCC)    Examination-Activity Limitations Bend;Carry;Squat;Continence;Stand;Transfers;Locomotion Level;Reach Overhead;Bed Mobility;Lift;Stairs;Toileting;Dressing    Examination-Participation Restrictions Community Activity;Laundry;Yard Work;Cleaning;Driving;Meal Prep;Shop    Stability/Clinical Decision Making Evolving/Moderate complexity    Rehab Potential Fair    Clinical Impairments Affecting Rehab Potential Cardiorespiratory limitations    PT Frequency 2x / week    PT Duration 12 weeks    PT Treatment/Interventions ADLs/Self Care Home Management;Moist Heat;Cryotherapy;Electrical Stimulation;Functional mobility training;Therapeutic activities;Therapeutic exercise;DME Instruction;Gait training;Balance training;Neuromuscular re-education;Patient/family education;Energy conservation;Biofeedback;Ultrasound;Stair training;Orthotic Fit/Training;Manual techniques;Manual lymph drainage;Dry needling;Passive range of motion;Taping;Vestibular;Visual/perceptual remediation/compensation    PT Next Visit Plan trial  period    PT Home Exercise Plan No updates today    Consulted and  Agree with Plan of Care Patient             Patient will benefit from skilled therapeutic intervention in order to improve the following deficits and impairments:  Abnormal gait, Difficulty walking, Cardiopulmonary status limiting activity, Decreased endurance, Impaired UE functional use, Obesity, Decreased activity tolerance, Decreased balance, Improper body mechanics, Decreased mobility, Decreased strength, Impaired sensation, Postural dysfunction, Increased edema, Impaired flexibility, Decreased range of motion, Decreased coordination, Pain  Visit Diagnosis: Muscle weakness (generalized)  Unsteadiness on feet  Other abnormalities of gait and mobility     Problem List Patient Active Problem List   Diagnosis Date Noted   Atrial fibrillation ([place]) 04/09/2019   Essential hypertension 04/09/2019   Venous stasis dermatitis of both lower extremities 04/09/2019   Lower limb ulcer, ankle, right, limited to breakdown of skin ([place]) 04/09/2019   Rhabdomyolysis 12/10/2018   Sepsis ([place]) 07/28/2018   Cough 03/20/2015   Bronchitis, chronic obstructive w acute bronchitis (HCC) 24/40/1027   Umbilical hernia without obstruction and without gangrene 01/12/2015   Ventral hernia without obstruction or gangrene 01/02/2015   Janna Arch, PT, DPT  01/13/2021, 2:34 PM  [place] [address] [place], [place], [address] Phone: [phone]   Fax:  [phone]  Name: Talan Gildner. MRN: [id number] Date of Birth: 1940-09-15

## 2021-01-18 ENCOUNTER — Ambulatory Visit: Payer: HMO | Admitting: Physical Therapy

## 2021-01-20 ENCOUNTER — Ambulatory Visit: Payer: HMO

## 2021-01-25 ENCOUNTER — Ambulatory Visit: Payer: HMO | Admitting: Physical Therapy

## 2021-01-27 ENCOUNTER — Ambulatory Visit: Payer: HMO | Attending: Internal Medicine

## 2021-01-27 ENCOUNTER — Other Ambulatory Visit: Payer: Self-pay

## 2021-01-27 DIAGNOSIS — R2689 Other abnormalities of gait and mobility: Secondary | ICD-10-CM | POA: Diagnosis not present

## 2021-01-27 DIAGNOSIS — R262 Difficulty in walking, not elsewhere classified: Secondary | ICD-10-CM | POA: Diagnosis not present

## 2021-01-27 DIAGNOSIS — M6281 Muscle weakness (generalized): Secondary | ICD-10-CM | POA: Insufficient documentation

## 2021-01-27 DIAGNOSIS — R2681 Unsteadiness on feet: Secondary | ICD-10-CM | POA: Diagnosis not present

## 2021-01-27 DIAGNOSIS — R269 Unspecified abnormalities of gait and mobility: Secondary | ICD-10-CM | POA: Insufficient documentation

## 2021-01-27 NOTE — Therapy (Signed)
[place] MAIN [facility] SERVICES [address] [place], [place], [address] Phone: [phone]   Fax:  [phone]  Physical Therapy Treatment  Patient Details  Name: Daniel Eaton. MRN: [id number] Date of Birth: Jul 29, 1940 Referring Provider (PT): Dr. [name] MD   Encounter Date: 01/27/2021   PT End of Session - 01/27/21 1546     Visit Number 35    Number of Visits 47    Date for PT Re-Evaluation 03/22/21    Authorization Type 4/10 PN 11/9    Authorization Time Period 10/06/20-12/29/20    PT Start Time 1531    PT Stop Time 1600    PT Time Calculation (min) 29 min    Equipment Utilized During Treatment Gait belt    Activity Tolerance Patient limited by fatigue;Patient tolerated treatment well;Patient limited by pain    Behavior During Therapy WFL for tasks assessed/performed             Past Medical History:  Diagnosis Date   A-fib ([place])    Cellulitis    CHF (congestive heart failure) (HCC)    COPD (chronic obstructive pulmonary disease) ([place])    Difficult intubation    Hyperlipidemia    Hypertension    Hypothyroidism    Kidney disease    Sleep apnea    Stroke [facility])    Thyroid disease     Past Surgical History:  Procedure Laterality Date   CARDIAC CATHETERIZATION     CATARACT EXTRACTION W/ INTRAOCULAR LENS  IMPLANT, BILATERAL     COLONOSCOPY  2008   HERNIA REPAIR     bilateral inguinal hernia/ [place]   HERNIA REPAIR  02/02/2015   18 x 28 cm ventral light mesh placed laparoscopically.   TONSILLECTOMY     UMBILICAL HERNIA REPAIR N/A 02/02/2015   Procedure: HERNIA REPAIR UMBILICAL ADULT;  Surgeon: Robert Bellow, MD;  Location: [facility] ORS;  Service: General;  Laterality: N/A;   VENTRAL HERNIA REPAIR N/A 02/02/2015   Procedure: LAPAROSCOPIC VENTRAL HERNIA;  Surgeon: Robert Bellow, MD;  Location: [facility] ORS;  Service: General;  Laterality: N/A;   vp shunt placement  1979    There were no vitals filed  for this visit.   Subjective Assessment - 01/27/21 1533     Subjective Patient reports he has been feeling very weak due to his medication. Has not been seen since 01/13/21, arrived late to PT session. Has been attempting some HEP but not much.    Pertinent History Pt is a 80 y.o. male referred to PT for Physical deconditioning. Pt is known to this clinic and has received rehab here in 2019. Pt has a significant PMH of COPD, A-fib, Aortic Atherosclerosis, ischemic cardiomyopathy, benign hypertension with chronic kidney disease, stage III (CMS-HCC); Essential hypertension; Chronic bronchitis, unspecified chronic bronchitis type (CMS-HCC); OSA (obstructive sleep apnea); Morbid obesity with BMI of 40.0-44.9, adult (CMS-HCC) per last cardiology visit. Pt reports being unable to being able to participate in PT during his stay at [facility] this previous October and also tested positive for COVID-19.  Pt's goal is to focus on gait, balance, LE strength, and walking endurance with RW. Was last seen by this therapist on 05/27/20.    Limitations Standing;House hold activities;Walking;Lifting    How long can you sit comfortably? hour    How long can you stand comfortably? not sure    How long can you walk comfortably? walk with RW to bathroom and back  Patient Stated Goals Improve gait, balance, and LE strength. Reduce risk of falls.    Currently in Pain? No/denies                    Hasn't been seen since 11/30  Treatment:    SuperSet 1: Sit to stand from raised plinth table 5x;  standing march 10x each LE with BUE support on RW ;      SuperSet 2:  Sit to stand to stand 5x from raised plinth table; Standing step back/hip extension 10x each LE with BUE support on RW-unable to perform due to patient fatigued/short of breath; regressed to seated LAQ 10x each LE 3 second holds    Superset 3: Sit to stand 5x from raised plinth table  10 gluteal squeezes 3 second hlds    Static  stand no UE support 45 seconds Seated: Opposite UE/LE raises 10x each side (added to HEP)  Pt educated throughout session about proper posture and technique with exercises. Improved exercise technique, movement at target joints, use of target muscles after min to mod verbal, visual, tactile cues.          Patient educated on need for compliance and attendance for therapy to work. Patient appears weaker and is more short of breath this session. Patient's session significantly limited by late arrival. His ability to sit to stand did improve throughout session time with decreased need for use of Ue's with standing position. He would benefit from additional skilled PT Intervention to improve strength and mobility;          PT Education - 01/27/21 1545     Education provided Yes    Education Details attendance to therapy, body mechanics    Person(s) Educated Patient    Methods Explanation;Demonstration;Tactile cues;Verbal cues    Comprehension Verbalized understanding;Returned demonstration;Verbal cues required;Tactile cues required              PT Short Term Goals - 08/26/20 1154       PT SHORT TERM GOAL #1   Title Pt will be compliant with given HEP to improve LE muscle strength.    Baseline 6/1: HEP added to 7/13: HEP compliant    Time 6    Period Weeks    Status Achieved    Target Date 08/26/20      PT SHORT TERM GOAL #2   Title Patient will stand from standard height chair on first attempt to allow for improved mobility.     Baseline 6/1: unable to perform 7/13: MET    Time 6    Period Weeks    Status Achieved    Target Date 08/26/20               PT Long Term Goals - 12/28/20 1701       PT LONG TERM GOAL #1   Title Pt will improve FOTO goal to 48 to display perceived improvements in functional mobility.    Baseline 6/1: 40% 7/13: 45% 8/23: 40%,9/14: 45% 11/14: 45%    Time 12    Period Weeks    Status Partially Met    Target Date 03/22/21       PT LONG TERM GOAL #2   Title Patient (> 62 years old) will complete five times sit to stand test in < 1 minute indicating an increased LE strength and improved balance.    Baseline 6/1: 1 min 31 seconds with heavy BUE support 7/13: 49 seconds with heavy BUE support  11/9: 48.5 seconds    Time 12    Period Weeks    Status Achieved      PT LONG TERM GOAL #3   Title Patient will increase BLE gross strength to 4+/5 as to improve functional strength for independent gait, increased standing tolerance and increased ADL ability.    Baseline 6/1: see note 7/13: see note 8/23: grossly 4/5 bilaterally with hip extension 3+/5    Time 12    Period Weeks    Status Partially Met    Target Date 03/22/21      PT LONG TERM GOAL #4   Title Patient will increase 10 meter walk test to >1.0 m/s as to improve gait speed for better community ambulation and to reduce fall risk.    Baseline 6/1: 0.41 m/s with RW 7/13: 0.6 m/s with walker 8/23: 0.6 m/s with walker 9/14: 0.61 m/s with RW 11/9: 0.49 m/s with RW    Time 12    Period Weeks    Status On-going    Target Date 03/22/21      PT LONG TERM GOAL #5   Title Patient will stand without UE support >3 minutes for ADL performance and increased stability with independent movement.    Baseline 6/1: 1 min 7 seconds 7/13: 3 min 3 seconds    Time 12    Period Weeks    Status Achieved      PT LONG TERM GOAL #6   Title Patient will increase Berg Balance score by > 6 points (29/56 )  to demonstrate decreased fall risk during functional activities.    Baseline 8/23: 23/56 11/9: 25/56    Time 12    Period Weeks    Status Partially Met    Target Date 03/22/21      PT LONG TERM GOAL #7   Title Patient will ambulate >300 ft during 2 minute walk test with RW for improved capacity for functional mobility.    Baseline 8/23: 88 ft with frequent standing breaks; 9/14: 162f 11/14: 87.1 ft    Time 12    Period Weeks    Status Partially Met    Target Date 03/22/21       PT LONG TERM GOAL #8   Title Patient will increase ABC scale score >50% to demonstrate better functional mobility and better confidence with ADLs.    Baseline 11/14: 20%    Time 12    Period Weeks    Status New    Target Date 03/22/21                   Plan - 01/27/21 1550     Clinical Impression Statement Patient educated on need for compliance and attendance for therapy to work. Patient appears weaker and is more short of breath this session. Patient's session significantly limited by late arrival. His ability to sit to stand did improve throughout session time with decreased need for use of Ue's with standing position. He would benefit from additional skilled PT Intervention to improve strength and mobility;    Personal Factors and Comorbidities Age;Comorbidity 3+;Past/Current Experience;Fitness    Comorbidities OPD, A-fib, Aortic Atherosclerosis, ischemic cardiomyopathy, Benign hypertension with chronic kidney disease, stage III (CMS-HCC); Essential hypertension; Chronic bronchitis, unspecified chronic bronchitis type (CMS-HCC); OSA (obstructive sleep apnea); Morbid obesity with BMI of 40.0-44.9, adult (CMS-HCC)    Examination-Activity Limitations Bend;Carry;Squat;Continence;Stand;Transfers;Locomotion Level;Reach Overhead;Bed Mobility;Lift;Stairs;Toileting;Dressing    Examination-Participation Restrictions Community Activity;Laundry;Yard Work;Cleaning;Driving;Meal Prep;Shop    Stability/Clinical Decision Making Evolving/Moderate complexity  Rehab Potential Fair    Clinical Impairments Affecting Rehab Potential Cardiorespiratory limitations    PT Frequency 2x / week    PT Duration 12 weeks    PT Treatment/Interventions ADLs/Self Care Home Management;Moist Heat;Cryotherapy;Electrical Stimulation;Functional mobility training;Therapeutic activities;Therapeutic exercise;DME Instruction;Gait training;Balance training;Neuromuscular re-education;Patient/family education;Energy  conservation;Biofeedback;Ultrasound;Stair training;Orthotic Fit/Training;Manual techniques;Manual lymph drainage;Dry needling;Passive range of motion;Taping;Vestibular;Visual/perceptual remediation/compensation    PT Next Visit Plan trial period    PT Home Exercise Plan No updates today    Consulted and Agree with Plan of Care Patient             Patient will benefit from skilled therapeutic intervention in order to improve the following deficits and impairments:  Abnormal gait, Difficulty walking, Cardiopulmonary status limiting activity, Decreased endurance, Impaired UE functional use, Obesity, Decreased activity tolerance, Decreased balance, Improper body mechanics, Decreased mobility, Decreased strength, Impaired sensation, Postural dysfunction, Increased edema, Impaired flexibility, Decreased range of motion, Decreased coordination, Pain  Visit Diagnosis: Muscle weakness (generalized)  Unsteadiness on feet  Other abnormalities of gait and mobility     Problem List Patient Active Problem List   Diagnosis Date Noted   Atrial fibrillation ([place]) 04/09/2019   Essential hypertension 04/09/2019   Venous stasis dermatitis of both lower extremities 04/09/2019   Lower limb ulcer, ankle, right, limited to breakdown of skin ([place]) 04/09/2019   Rhabdomyolysis 12/10/2018   Sepsis ([place]) 07/28/2018   Cough 03/20/2015   Bronchitis, chronic obstructive w acute bronchitis (HCC) 30/16/0109   Umbilical hernia without obstruction and without gangrene 01/12/2015   Ventral hernia without obstruction or gangrene 01/02/2015    Janna Arch, PT, DPT  01/27/2021, 4:55 PM  [facility] [address] [place], [place], [address] Phone: [phone]   Fax:  [phone]  Name: Daniel Eaton. MRN: [id number] Date of Birth: Jun 04, 1940

## 2021-02-01 ENCOUNTER — Ambulatory Visit: Payer: HMO

## 2021-02-01 ENCOUNTER — Other Ambulatory Visit: Payer: Self-pay

## 2021-02-01 DIAGNOSIS — R262 Difficulty in walking, not elsewhere classified: Secondary | ICD-10-CM

## 2021-02-01 DIAGNOSIS — R269 Unspecified abnormalities of gait and mobility: Secondary | ICD-10-CM

## 2021-02-01 DIAGNOSIS — E1142 Type 2 diabetes mellitus with diabetic polyneuropathy: Secondary | ICD-10-CM | POA: Diagnosis not present

## 2021-02-01 DIAGNOSIS — B351 Tinea unguium: Secondary | ICD-10-CM | POA: Diagnosis not present

## 2021-02-01 DIAGNOSIS — M6281 Muscle weakness (generalized): Secondary | ICD-10-CM | POA: Diagnosis not present

## 2021-02-01 DIAGNOSIS — L03031 Cellulitis of right toe: Secondary | ICD-10-CM | POA: Diagnosis not present

## 2021-02-01 DIAGNOSIS — L600 Ingrowing nail: Secondary | ICD-10-CM | POA: Diagnosis not present

## 2021-02-01 DIAGNOSIS — R2681 Unsteadiness on feet: Secondary | ICD-10-CM

## 2021-02-01 DIAGNOSIS — L97511 Non-pressure chronic ulcer of other part of right foot limited to breakdown of skin: Secondary | ICD-10-CM | POA: Diagnosis not present

## 2021-02-01 DIAGNOSIS — M2042 Other hammer toe(s) (acquired), left foot: Secondary | ICD-10-CM | POA: Diagnosis not present

## 2021-02-01 DIAGNOSIS — M2041 Other hammer toe(s) (acquired), right foot: Secondary | ICD-10-CM | POA: Diagnosis not present

## 2021-02-01 NOTE — Therapy (Signed)
[place] MAIN [facility] SERVICES [address] [place], [place], [address] Phone: [phone]   Fax:  [phone]  Physical Therapy Treatment  Patient Details  Name: Daniel Eaton. MRN: [id number] Date of Birth: 05-09-1940 Referring Provider (PT): Dr. [name] MD   Encounter Date: 02/01/2021   PT End of Session - 02/01/21 1558     Visit Number 36    Number of Visits 56    Date for PT Re-Evaluation 03/22/21    Authorization Type 4/10 PN 11/9    Authorization Time Period 10/06/20-12/29/20    PT Start Time 1304    PT Stop Time 1344    PT Time Calculation (min) 40 min    Equipment Utilized During Treatment Gait belt    Activity Tolerance Patient limited by fatigue;Patient tolerated treatment well;Patient limited by pain    Behavior During Therapy WFL for tasks assessed/performed             Past Medical History:  Diagnosis Date   A-fib ([place])    Cellulitis    CHF (congestive heart failure) (HCC)    COPD (chronic obstructive pulmonary disease) ([place])    Difficult intubation    Hyperlipidemia    Hypertension    Hypothyroidism    Kidney disease    Sleep apnea    Stroke [facility])    Thyroid disease     Past Surgical History:  Procedure Laterality Date   CARDIAC CATHETERIZATION     CATARACT EXTRACTION W/ INTRAOCULAR LENS  IMPLANT, BILATERAL     COLONOSCOPY  2008   HERNIA REPAIR     bilateral inguinal hernia/ [place]   HERNIA REPAIR  02/02/2015   18 x 28 cm ventral light mesh placed laparoscopically.   TONSILLECTOMY     UMBILICAL HERNIA REPAIR N/A 02/02/2015   Procedure: HERNIA REPAIR UMBILICAL ADULT;  Surgeon: Robert Bellow, MD;  Location: [facility] ORS;  Service: General;  Laterality: N/A;   VENTRAL HERNIA REPAIR N/A 02/02/2015   Procedure: LAPAROSCOPIC VENTRAL HERNIA;  Surgeon: Robert Bellow, MD;  Location: [facility] ORS;  Service: General;  Laterality: N/A;   vp shunt placement  1979    There were no vitals filed  for this visit.   Subjective Assessment - 02/01/21 1306     Subjective Patient reports the weather has affected him both with some soreness and shortness of breath. Reports he brought his rescue inhaler today. He also states he has a podiatrist appointment.    Pertinent History Pt is a 80 y.o. male referred to PT for Physical deconditioning. Pt is known to this clinic and has received rehab here in 2019. Pt has a significant PMH of COPD, A-fib, Aortic Atherosclerosis, ischemic cardiomyopathy, benign hypertension with chronic kidney disease, stage III (CMS-HCC); Essential hypertension; Chronic bronchitis, unspecified chronic bronchitis type (CMS-HCC); OSA (obstructive sleep apnea); Morbid obesity with BMI of 40.0-44.9, adult (CMS-HCC) per last cardiology visit. Pt reports being unable to being able to participate in PT during his stay at [facility] this previous October and also tested positive for COVID-19.  Pt's goal is to focus on gait, balance, LE strength, and walking endurance with RW. Was last seen by this therapist on 05/27/20.    Limitations Standing;House hold activities;Walking;Lifting    How long can you sit comfortably? hour    How long can you stand comfortably? not sure    How long can you walk comfortably? walk with RW to bathroom and back    Patient  Stated Goals Improve gait, balance, and LE strength. Reduce risk of falls.    Currently in Pain? Yes   B shoulders and knees   Pain Score 5             INTERVENTIONS:   Therapeutic exercises:    O2 sat prework= 100% on room air Circuit style workout:  1st round:   LE)- Hip march 2.5lb x 10 reps alt LE- Minimal Height with raises- patient able to count out loud for proper breathing.  Posture) -Scap retract with GTB x 12 reps  2nd round:  LE)- Knee ext with 2.5 lb x 10 reps alt LE Posture) - Horizontal Shoulder ABD with GTB x 8 (patient with difficulty achieving good ROM- so disontinued using band and performed just  active motion x 10 reps followed by around 10-15 posterior shoulder rolls.   3rd round:   Hip flex/abd up and over leg rest of transport chair x 10 reps each LE using 2.5 lb. AW  Gait around gym approx 70 feet using front wheeled walker- Short reciprocal steps- 1 standing rest break.  O2 sat= 100%    4th round:  LE- Sit to stand from raised plinth table (24.5 in) high x 9 without UE support last one with very minimal UE push -Standing Lumbar flex into ext x 10  RPE rated 7/10     Education provided throughout session via VC/TC and demonstration to facilitate movement at target joints and correct muscle activation for all testing and exercises performed.                     PT Education - 02/01/21 1557     Education provided Yes    Education Details Exercise technique    Person(s) Educated Patient    Methods Explanation;Demonstration;Tactile cues;Verbal cues    Comprehension Verbalized understanding;Returned demonstration;Verbal cues required;Tactile cues required;Need further instruction              PT Short Term Goals - 08/26/20 1154       PT SHORT TERM GOAL #1   Title Pt will be compliant with given HEP to improve LE muscle strength.    Baseline 6/1: HEP added to 7/13: HEP compliant    Time 6    Period Weeks    Status Achieved    Target Date 08/26/20      PT SHORT TERM GOAL #2   Title Patient will stand from standard height chair on first attempt to allow for improved mobility.     Baseline 6/1: unable to perform 7/13: MET    Time 6    Period Weeks    Status Achieved    Target Date 08/26/20               PT Long Term Goals - 12/28/20 1701       PT LONG TERM GOAL #1   Title Pt will improve FOTO goal to 48 to display perceived improvements in functional mobility.    Baseline 6/1: 40% 7/13: 45% 8/23: 40%,9/14: 45% 11/14: 45%    Time 12    Period Weeks    Status Partially Met    Target Date 03/22/21      PT LONG TERM GOAL #2    Title Patient (> 54 years old) will complete five times sit to stand test in < 1 minute indicating an increased LE strength and improved balance.    Baseline 6/1: 1 min 31 seconds with heavy BUE support  7/13: 49 seconds with heavy BUE support 11/9: 48.5 seconds    Time 12    Period Weeks    Status Achieved      PT LONG TERM GOAL #3   Title Patient will increase BLE gross strength to 4+/5 as to improve functional strength for independent gait, increased standing tolerance and increased ADL ability.    Baseline 6/1: see note 7/13: see note 8/23: grossly 4/5 bilaterally with hip extension 3+/5    Time 12    Period Weeks    Status Partially Met    Target Date 03/22/21      PT LONG TERM GOAL #4   Title Patient will increase 10 meter walk test to >1.0 m/s as to improve gait speed for better community ambulation and to reduce fall risk.    Baseline 6/1: 0.41 m/s with RW 7/13: 0.6 m/s with walker 8/23: 0.6 m/s with walker 9/14: 0.61 m/s with RW 11/9: 0.49 m/s with RW    Time 12    Period Weeks    Status On-going    Target Date 03/22/21      PT LONG TERM GOAL #5   Title Patient will stand without UE support >3 minutes for ADL performance and increased stability with independent movement.    Baseline 6/1: 1 min 7 seconds 7/13: 3 min 3 seconds    Time 12    Period Weeks    Status Achieved      PT LONG TERM GOAL #6   Title Patient will increase Berg Balance score by > 6 points (29/56 )  to demonstrate decreased fall risk during functional activities.    Baseline 8/23: 23/56 11/9: 25/56    Time 12    Period Weeks    Status Partially Met    Target Date 03/22/21      PT LONG TERM GOAL #7   Title Patient will ambulate >300 ft during 2 minute walk test with RW for improved capacity for functional mobility.    Baseline 8/23: 88 ft with frequent standing breaks; 9/14: 173f 11/14: 87.1 ft    Time 12    Period Weeks    Status Partially Met    Target Date 03/22/21      PT LONG TERM GOAL #8    Title Patient will increase ABC scale score >50% to demonstrate better functional mobility and better confidence with ADLs.    Baseline 11/14: 20%    Time 12    Period Weeks    Status New    Target Date 03/22/21                   Plan - 02/01/21 1555     Clinical Impression Statement Treatment was modified due to patient complaining of sore shoulders/knees and some SOB due to colder weather- He did respond well to modified workout - mostly in seated position. He was able to stand from raised height without UE support and he tried to work on posture despite difficulty mobilizing his shoulders. He was able to complete all exercises with minimal rest breaks today. He would benefit from additional skilled PT Intervention to improve strength and mobility    Personal Factors and Comorbidities Age;Comorbidity 3+;Past/Current Experience;Fitness    Comorbidities OPD, A-fib, Aortic Atherosclerosis, ischemic cardiomyopathy, Benign hypertension with chronic kidney disease, stage III (CMS-HCC); Essential hypertension; Chronic bronchitis, unspecified chronic bronchitis type (CMS-HCC); OSA (obstructive sleep apnea); Morbid obesity with BMI of 40.0-44.9, adult (CMS-HCC)    Examination-Activity Limitations Bend;Carry;Squat;Continence;Stand;Transfers;Locomotion  Level;Reach Overhead;Bed Mobility;Lift;Stairs;Toileting;Dressing    Examination-Participation Restrictions Community Activity;Laundry;Yard Work;Cleaning;Driving;Meal Prep;Shop    Stability/Clinical Decision Making Evolving/Moderate complexity    Rehab Potential Fair    Clinical Impairments Affecting Rehab Potential Cardiorespiratory limitations    PT Frequency 2x / week    PT Duration 12 weeks    PT Treatment/Interventions ADLs/Self Care Home Management;Moist Heat;Cryotherapy;Electrical Stimulation;Functional mobility training;Therapeutic activities;Therapeutic exercise;DME Instruction;Gait training;Balance training;Neuromuscular  re-education;Patient/family education;Energy conservation;Biofeedback;Ultrasound;Stair training;Orthotic Fit/Training;Manual techniques;Manual lymph drainage;Dry needling;Passive range of motion;Taping;Vestibular;Visual/perceptual remediation/compensation    PT Next Visit Plan Continue with progressive LE Strengthening and functional endurance based activities.    PT Home Exercise Plan No updates today    Consulted and Agree with Plan of Care Patient             Patient will benefit from skilled therapeutic intervention in order to improve the following deficits and impairments:  Abnormal gait, Difficulty walking, Cardiopulmonary status limiting activity, Decreased endurance, Impaired UE functional use, Obesity, Decreased activity tolerance, Decreased balance, Improper body mechanics, Decreased mobility, Decreased strength, Impaired sensation, Postural dysfunction, Increased edema, Impaired flexibility, Decreased range of motion, Decreased coordination, Pain  Visit Diagnosis: Abnormality of gait and mobility  Difficulty in walking, not elsewhere classified  Muscle weakness (generalized)  Unsteadiness on feet     Problem List Patient Active Problem List   Diagnosis Date Noted   Atrial fibrillation ([place]) 04/09/2019   Essential hypertension 04/09/2019   Venous stasis dermatitis of both lower extremities 04/09/2019   Lower limb ulcer, ankle, right, limited to breakdown of skin ([place]) 04/09/2019   Rhabdomyolysis 12/10/2018   Sepsis ([place]) 07/28/2018   Cough 03/20/2015   Bronchitis, chronic obstructive w acute bronchitis ([place]) 55/73/3780   Umbilical hernia without obstruction and without gangrene 01/12/2015   Ventral hernia without obstruction or gangrene 01/02/2015    Lewis Moccasin, PT 02/01/2021, 4:00 PM  [place] MAIN [facility] SERVICES [address] [place], [place], [address] Phone: [phone]   Fax:  [phone]  Name: Daniel Eaton. MRN: [id number] Date of Birth: 05-15-1940

## 2021-02-03 ENCOUNTER — Ambulatory Visit: Payer: HMO

## 2021-02-03 ENCOUNTER — Other Ambulatory Visit: Payer: Self-pay

## 2021-02-03 DIAGNOSIS — R269 Unspecified abnormalities of gait and mobility: Secondary | ICD-10-CM

## 2021-02-03 DIAGNOSIS — R2681 Unsteadiness on feet: Secondary | ICD-10-CM

## 2021-02-03 DIAGNOSIS — M6281 Muscle weakness (generalized): Secondary | ICD-10-CM

## 2021-02-03 NOTE — Therapy (Signed)
[place] MAIN [facility] SERVICES [address] [place], [place], [address] Phone: [phone]   Fax:  [phone]  Physical Therapy Treatment  Patient Details  Name: Daniel Eaton. MRN: [id number] Date of Birth: 02-Jan-1941 Referring Provider (PT): Dr. [name] MD   Encounter Date: 02/03/2021   PT End of Session - 02/03/21 1442     Visit Number 37    Number of Visits 71    Date for PT Re-Evaluation 03/22/21    Authorization Type 7/10 PN 11/9    Authorization Time Period 10/06/20-12/29/20    PT Start Time 1402    PT Stop Time 1430    PT Time Calculation (min) 28 min    Equipment Utilized During Treatment Gait belt    Activity Tolerance Patient limited by fatigue;Patient tolerated treatment well;Patient limited by pain    Behavior During Therapy WFL for tasks assessed/performed             Past Medical History:  Diagnosis Date   A-fib ([place])    Cellulitis    CHF (congestive heart failure) ([place])    COPD (chronic obstructive pulmonary disease) ([place])    Difficult intubation    Hyperlipidemia    Hypertension    Hypothyroidism    Kidney disease    Sleep apnea    Stroke [facility])    Thyroid disease     Past Surgical History:  Procedure Laterality Date   CARDIAC CATHETERIZATION     CATARACT EXTRACTION W/ INTRAOCULAR LENS  IMPLANT, BILATERAL     COLONOSCOPY  2008   HERNIA REPAIR     bilateral inguinal hernia/ [facility]   HERNIA REPAIR  02/02/2015   18 x 28 cm ventral light mesh placed laparoscopically.   TONSILLECTOMY     UMBILICAL HERNIA REPAIR N/A 02/02/2015   Procedure: HERNIA REPAIR UMBILICAL ADULT;  Surgeon: Robert Bellow, MD;  Location: [facility] ORS;  Service: General;  Laterality: N/A;   VENTRAL HERNIA REPAIR N/A 02/02/2015   Procedure: LAPAROSCOPIC VENTRAL HERNIA;  Surgeon: Robert Bellow, MD;  Location: [facility] ORS;  Service: General;  Laterality: N/A;   vp shunt placement  1979    There were no vitals filed  for this visit.   Subjective Assessment - 02/03/21 1441     Subjective Patient arrived more than 15 minutes late, reports he tried to leave early but there was issues upstairs. Brought his rescue inhaler.    Pertinent History Pt is a 80 y.o. male referred to PT for Physical deconditioning. Pt is known to this clinic and has received rehab here in 2019. Pt has a significant PMH of COPD, A-fib, Aortic Atherosclerosis, ischemic cardiomyopathy, benign hypertension with chronic kidney disease, stage III (CMS-HCC); Essential hypertension; Chronic bronchitis, unspecified chronic bronchitis type (CMS-HCC); OSA (obstructive sleep apnea); Morbid obesity with BMI of 40.0-44.9, adult (CMS-HCC) per last cardiology visit. Pt reports being unable to being able to participate in PT during his stay at [facility] this previous October and also tested positive for COVID-19.  Pt's goal is to focus on gait, balance, LE strength, and walking endurance with RW. Was last seen by this therapist on 05/27/20.    Limitations Standing;House hold activities;Walking;Lifting    How long can you sit comfortably? hour    How long can you stand comfortably? not sure    How long can you walk comfortably? walk with RW to bathroom and back    Patient Stated Goals Improve gait, balance, and LE strength.  Reduce risk of falls.    Currently in Pain? Yes    Pain Score --   whole body ache               Patient educated that if he continues to be late that he will not be able to continue therapy.   Ambulate 86 ft with bRW and cues for LLE clearance for last 10 ft. HR elevation to 124; x2 trials, rescue inhaler required after second round.  Seated scapular retractions ; multiple repetitions   22" raised plinth table 5x STS without UE support with bRW in front of him;  2 sets with second set requiring BUE support   Static stand 45 seconds without UE support  Pt educated throughout session about proper posture and technique  with exercises. Improved exercise technique, movement at target joints, use of target muscles after min to mod verbal, visual, tactile cues.   Patient educated that if he continues to be late that he will not be able to continue therapy due to company late/no show policy. Patient arrived past the allotted 15 minute late window however was still seen with warning. Patient able to perform first set of sit to stands without UE support for one of first times indicating carryover to previous sessions. He would benefit from additional skilled PT Intervention to improve strength and mobility                        PT Education - 02/03/21 1442     Education provided Yes    Education Details exercise technique, body mechanics    Person(s) Educated Patient    Methods Explanation;Demonstration;Verbal cues;Tactile cues    Comprehension Verbalized understanding;Returned demonstration;Verbal cues required;Tactile cues required              PT Short Term Goals - 08/26/20 1154       PT SHORT TERM GOAL #1   Title Pt will be compliant with given HEP to improve LE muscle strength.    Baseline 6/1: HEP added to 7/13: HEP compliant    Time 6    Period Weeks    Status Achieved    Target Date 08/26/20      PT SHORT TERM GOAL #2   Title Patient will stand from standard height chair on first attempt to allow for improved mobility.     Baseline 6/1: unable to perform 7/13: MET    Time 6    Period Weeks    Status Achieved    Target Date 08/26/20               PT Long Term Goals - 12/28/20 1701       PT LONG TERM GOAL #1   Title Pt will improve FOTO goal to 48 to display perceived improvements in functional mobility.    Baseline 6/1: 40% 7/13: 45% 8/23: 40%,9/14: 45% 11/14: 45%    Time 12    Period Weeks    Status Partially Met    Target Date 03/22/21      PT LONG TERM GOAL #2   Title Patient (> 42 years old) will complete five times sit to stand test in < 1 minute  indicating an increased LE strength and improved balance.    Baseline 6/1: 1 min 31 seconds with heavy BUE support 7/13: 49 seconds with heavy BUE support 11/9: 48.5 seconds    Time 12    Period Weeks    Status Achieved  PT LONG TERM GOAL #3   Title Patient will increase BLE gross strength to 4+/5 as to improve functional strength for independent gait, increased standing tolerance and increased ADL ability.    Baseline 6/1: see note 7/13: see note 8/23: grossly 4/5 bilaterally with hip extension 3+/5    Time 12    Period Weeks    Status Partially Met    Target Date 03/22/21      PT LONG TERM GOAL #4   Title Patient will increase 10 meter walk test to >1.0 m/s as to improve gait speed for better community ambulation and to reduce fall risk.    Baseline 6/1: 0.41 m/s with RW 7/13: 0.6 m/s with walker 8/23: 0.6 m/s with walker 9/14: 0.61 m/s with RW 11/9: 0.49 m/s with RW    Time 12    Period Weeks    Status On-going    Target Date 03/22/21      PT LONG TERM GOAL #5   Title Patient will stand without UE support >3 minutes for ADL performance and increased stability with independent movement.    Baseline 6/1: 1 min 7 seconds 7/13: 3 min 3 seconds    Time 12    Period Weeks    Status Achieved      PT LONG TERM GOAL #6   Title Patient will increase Berg Balance score by > 6 points (29/56 )  to demonstrate decreased fall risk during functional activities.    Baseline 8/23: 23/56 11/9: 25/56    Time 12    Period Weeks    Status Partially Met    Target Date 03/22/21      PT LONG TERM GOAL #7   Title Patient will ambulate >300 ft during 2 minute walk test with RW for improved capacity for functional mobility.    Baseline 8/23: 88 ft with frequent standing breaks; 9/14: 136f 11/14: 87.1 ft    Time 12    Period Weeks    Status Partially Met    Target Date 03/22/21      PT LONG TERM GOAL #8   Title Patient will increase ABC scale score >50% to demonstrate better functional  mobility and better confidence with ADLs.    Baseline 11/14: 20%    Time 12    Period Weeks    Status New    Target Date 03/22/21                   Plan - 02/03/21 1444     Clinical Impression Statement Patient educated that if he continues to be late that he will not be able to continue therapy due to company late/no show policy. Patient arrived past the allotted 15 minute late window however was still seen with warning. Patient able to perform first set of sit to stands without UE support for one of first times indicating carryover to previous sessions. He would benefit from additional skilled PT Intervention to improve strength and mobility    Personal Factors and Comorbidities Age;Comorbidity 3+;Past/Current Experience;Fitness    Comorbidities OPD, A-fib, Aortic Atherosclerosis, ischemic cardiomyopathy, Benign hypertension with chronic kidney disease, stage III (CMS-HCC); Essential hypertension; Chronic bronchitis, unspecified chronic bronchitis type (CMS-HCC); OSA (obstructive sleep apnea); Morbid obesity with BMI of 40.0-44.9, adult (CMS-HCC)    Examination-Activity Limitations Bend;Carry;Squat;Continence;Stand;Transfers;Locomotion Level;Reach Overhead;Bed Mobility;Lift;Stairs;Toileting;Dressing    Examination-Participation Restrictions Community Activity;Laundry;Yard Work;Cleaning;Driving;Meal Prep;Shop    Stability/Clinical Decision Making Evolving/Moderate complexity    Rehab Potential Fair    Clinical Impairments Affecting  Rehab Potential Cardiorespiratory limitations    PT Frequency 2x / week    PT Duration 12 weeks    PT Treatment/Interventions ADLs/Self Care Home Management;Moist Heat;Cryotherapy;Electrical Stimulation;Functional mobility training;Therapeutic activities;Therapeutic exercise;DME Instruction;Gait training;Balance training;Neuromuscular re-education;Patient/family education;Energy conservation;Biofeedback;Ultrasound;Stair training;Orthotic Fit/Training;Manual  techniques;Manual lymph drainage;Dry needling;Passive range of motion;Taping;Vestibular;Visual/perceptual remediation/compensation    PT Next Visit Plan Continue with progressive LE Strengthening and functional endurance based activities.    PT Home Exercise Plan No updates today    Consulted and Agree with Plan of Care Patient             Patient will benefit from skilled therapeutic intervention in order to improve the following deficits and impairments:  Abnormal gait, Difficulty walking, Cardiopulmonary status limiting activity, Decreased endurance, Impaired UE functional use, Obesity, Decreased activity tolerance, Decreased balance, Improper body mechanics, Decreased mobility, Decreased strength, Impaired sensation, Postural dysfunction, Increased edema, Impaired flexibility, Decreased range of motion, Decreased coordination, Pain  Visit Diagnosis: Abnormality of gait and mobility  Muscle weakness (generalized)  Unsteadiness on feet     Problem List Patient Active Problem List   Diagnosis Date Noted   Atrial fibrillation ([place]) 04/09/2019   Essential hypertension 04/09/2019   Venous stasis dermatitis of both lower extremities 04/09/2019   Lower limb ulcer, ankle, right, limited to breakdown of skin ([place]) 04/09/2019   Rhabdomyolysis 12/10/2018   Sepsis ([place]) 07/28/2018   Cough 03/20/2015   Bronchitis, chronic obstructive w acute bronchitis ([place]) 56/31/4970   Umbilical hernia without obstruction and without gangrene 01/12/2015   Ventral hernia without obstruction or gangrene 01/02/2015   Janna Arch, PT, DPT  02/03/2021, 2:45 PM  [place] [address] [place], [place], [address] Phone: [phone]   Fax:  [phone]  Name: Daniel Eaton. MRN: [id number] Date of Birth: 15-Oct-1940

## 2021-02-10 ENCOUNTER — Ambulatory Visit: Payer: HMO

## 2021-02-11 DIAGNOSIS — N183 Chronic kidney disease, stage 3 unspecified: Secondary | ICD-10-CM | POA: Diagnosis not present

## 2021-02-11 DIAGNOSIS — I4819 Other persistent atrial fibrillation: Secondary | ICD-10-CM | POA: Diagnosis not present

## 2021-02-11 DIAGNOSIS — I129 Hypertensive chronic kidney disease with stage 1 through stage 4 chronic kidney disease, or unspecified chronic kidney disease: Secondary | ICD-10-CM | POA: Diagnosis not present

## 2021-02-14 ENCOUNTER — Encounter: Payer: Self-pay | Admitting: Emergency Medicine

## 2021-02-14 ENCOUNTER — Other Ambulatory Visit: Payer: Self-pay

## 2021-02-14 ENCOUNTER — Inpatient Hospital Stay
Admission: EM | Admit: 2021-02-14 | Discharge: 2021-02-18 | DRG: 603 | Disposition: A | Discharge status: Skilled Nursing Facility | Payer: HMO | Attending: Hospitalist | Admitting: Hospitalist

## 2021-02-14 ENCOUNTER — Observation Stay: Payer: HMO

## 2021-02-14 ENCOUNTER — Emergency Department: Payer: HMO

## 2021-02-14 DIAGNOSIS — I129 Hypertensive chronic kidney disease with stage 1 through stage 4 chronic kidney disease, or unspecified chronic kidney disease: Secondary | ICD-10-CM | POA: Diagnosis not present

## 2021-02-14 DIAGNOSIS — I872 Venous insufficiency (chronic) (peripheral): Secondary | ICD-10-CM | POA: Diagnosis not present

## 2021-02-14 DIAGNOSIS — Z87891 Personal history of nicotine dependence: Secondary | ICD-10-CM

## 2021-02-14 DIAGNOSIS — R531 Weakness: Secondary | ICD-10-CM

## 2021-02-14 DIAGNOSIS — I480 Paroxysmal atrial fibrillation: Secondary | ICD-10-CM | POA: Diagnosis not present

## 2021-02-14 DIAGNOSIS — I10 Essential (primary) hypertension: Secondary | ICD-10-CM | POA: Diagnosis not present

## 2021-02-14 DIAGNOSIS — I509 Heart failure, unspecified: Secondary | ICD-10-CM | POA: Diagnosis not present

## 2021-02-14 DIAGNOSIS — E039 Hypothyroidism, unspecified: Secondary | ICD-10-CM

## 2021-02-14 DIAGNOSIS — N1831 Chronic kidney disease, stage 3a: Secondary | ICD-10-CM | POA: Diagnosis present

## 2021-02-14 DIAGNOSIS — Z79899 Other long term (current) drug therapy: Secondary | ICD-10-CM

## 2021-02-14 DIAGNOSIS — N189 Chronic kidney disease, unspecified: Secondary | ICD-10-CM | POA: Diagnosis not present

## 2021-02-14 DIAGNOSIS — Z8673 Personal history of transient ischemic attack (TIA), and cerebral infarction without residual deficits: Secondary | ICD-10-CM

## 2021-02-14 DIAGNOSIS — M62838 Other muscle spasm: Secondary | ICD-10-CM | POA: Diagnosis present

## 2021-02-14 DIAGNOSIS — E785 Hyperlipidemia, unspecified: Secondary | ICD-10-CM | POA: Diagnosis not present

## 2021-02-14 DIAGNOSIS — I639 Cerebral infarction, unspecified: Secondary | ICD-10-CM | POA: Diagnosis not present

## 2021-02-14 DIAGNOSIS — I739 Peripheral vascular disease, unspecified: Secondary | ICD-10-CM | POA: Diagnosis not present

## 2021-02-14 DIAGNOSIS — Y92019 Unspecified place in single-family (private) house as the place of occurrence of the external cause: Secondary | ICD-10-CM | POA: Diagnosis not present

## 2021-02-14 DIAGNOSIS — R262 Difficulty in walking, not elsewhere classified: Secondary | ICD-10-CM | POA: Diagnosis present

## 2021-02-14 DIAGNOSIS — G4733 Obstructive sleep apnea (adult) (pediatric): Secondary | ICD-10-CM | POA: Diagnosis not present

## 2021-02-14 DIAGNOSIS — I878 Other specified disorders of veins: Secondary | ICD-10-CM | POA: Diagnosis not present

## 2021-02-14 DIAGNOSIS — L03116 Cellulitis of left lower limb: Secondary | ICD-10-CM | POA: Diagnosis not present

## 2021-02-14 DIAGNOSIS — W228XXA Striking against or struck by other objects, initial encounter: Secondary | ICD-10-CM | POA: Diagnosis present

## 2021-02-14 DIAGNOSIS — D649 Anemia, unspecified: Secondary | ICD-10-CM | POA: Diagnosis not present

## 2021-02-14 DIAGNOSIS — K219 Gastro-esophageal reflux disease without esophagitis: Secondary | ICD-10-CM | POA: Diagnosis present

## 2021-02-14 DIAGNOSIS — Z8249 Family history of ischemic heart disease and other diseases of the circulatory system: Secondary | ICD-10-CM | POA: Diagnosis not present

## 2021-02-14 DIAGNOSIS — L0390 Cellulitis, unspecified: Secondary | ICD-10-CM | POA: Diagnosis not present

## 2021-02-14 DIAGNOSIS — Z888 Allergy status to other drugs, medicaments and biological substances status: Secondary | ICD-10-CM | POA: Diagnosis not present

## 2021-02-14 DIAGNOSIS — M6281 Muscle weakness (generalized): Secondary | ICD-10-CM | POA: Diagnosis not present

## 2021-02-14 DIAGNOSIS — Z7951 Long term (current) use of inhaled steroids: Secondary | ICD-10-CM

## 2021-02-14 DIAGNOSIS — K5900 Constipation, unspecified: Secondary | ICD-10-CM | POA: Diagnosis not present

## 2021-02-14 DIAGNOSIS — E860 Dehydration: Secondary | ICD-10-CM | POA: Diagnosis not present

## 2021-02-14 DIAGNOSIS — I5022 Chronic systolic (congestive) heart failure: Secondary | ICD-10-CM | POA: Diagnosis not present

## 2021-02-14 DIAGNOSIS — I130 Hypertensive heart and chronic kidney disease with heart failure and stage 1 through stage 4 chronic kidney disease, or unspecified chronic kidney disease: Secondary | ICD-10-CM | POA: Diagnosis not present

## 2021-02-14 DIAGNOSIS — I4891 Unspecified atrial fibrillation: Secondary | ICD-10-CM | POA: Diagnosis not present

## 2021-02-14 DIAGNOSIS — N179 Acute kidney failure, unspecified: Secondary | ICD-10-CM

## 2021-02-14 DIAGNOSIS — R778 Other specified abnormalities of plasma proteins: Secondary | ICD-10-CM

## 2021-02-14 DIAGNOSIS — S81812A Laceration without foreign body, left lower leg, initial encounter: Secondary | ICD-10-CM | POA: Diagnosis not present

## 2021-02-14 DIAGNOSIS — Z20822 Contact with and (suspected) exposure to covid-19: Secondary | ICD-10-CM | POA: Diagnosis present

## 2021-02-14 DIAGNOSIS — Z79890 Hormone replacement therapy (postmenopausal): Secondary | ICD-10-CM

## 2021-02-14 DIAGNOSIS — Z1159 Encounter for screening for other viral diseases: Secondary | ICD-10-CM | POA: Diagnosis not present

## 2021-02-14 DIAGNOSIS — R600 Localized edema: Secondary | ICD-10-CM | POA: Diagnosis not present

## 2021-02-14 DIAGNOSIS — J449 Chronic obstructive pulmonary disease, unspecified: Secondary | ICD-10-CM | POA: Diagnosis not present

## 2021-02-14 DIAGNOSIS — R0602 Shortness of breath: Secondary | ICD-10-CM | POA: Diagnosis not present

## 2021-02-14 DIAGNOSIS — Z6841 Body Mass Index (BMI) 40.0 and over, adult: Secondary | ICD-10-CM | POA: Diagnosis not present

## 2021-02-14 DIAGNOSIS — I6990 Unspecified sequelae of unspecified cerebrovascular disease: Secondary | ICD-10-CM | POA: Diagnosis not present

## 2021-02-14 DIAGNOSIS — Z7901 Long term (current) use of anticoagulants: Secondary | ICD-10-CM

## 2021-02-14 DIAGNOSIS — R0902 Hypoxemia: Secondary | ICD-10-CM | POA: Diagnosis not present

## 2021-02-14 LAB — CBC WITH DIFFERENTIAL/PLATELET
Abs Immature Granulocytes: 0.1 10*3/uL — ABNORMAL HIGH (ref 0.00–0.07)
Basophils Absolute: 0 10*3/uL (ref 0.0–0.1)
Basophils Relative: 0 %
Eosinophils Absolute: 0 10*3/uL (ref 0.0–0.5)
Eosinophils Relative: 0 %
HCT: 32.7 % — ABNORMAL LOW (ref 39.0–52.0)
Hemoglobin: 9.3 g/dL — ABNORMAL LOW (ref 13.0–17.0)
Immature Granulocytes: 1 %
Lymphocytes Relative: 14 %
Lymphs Abs: 1.2 10*3/uL (ref 0.7–4.0)
MCH: 23.9 pg — ABNORMAL LOW (ref 26.0–34.0)
MCHC: 28.4 g/dL — ABNORMAL LOW (ref 30.0–36.0)
MCV: 84.1 fL (ref 80.0–100.0)
Monocytes Absolute: 0.5 10*3/uL (ref 0.1–1.0)
Monocytes Relative: 6 %
Neutro Abs: 6.5 10*3/uL (ref 1.7–7.7)
Neutrophils Relative %: 79 %
Platelets: 339 10*3/uL (ref 150–400)
RBC: 3.89 MIL/uL — ABNORMAL LOW (ref 4.22–5.81)
RDW: 21.8 % — ABNORMAL HIGH (ref 11.5–15.5)
Smear Review: NORMAL
WBC: 8.4 10*3/uL (ref 4.0–10.5)
nRBC: 0.5 % — ABNORMAL HIGH (ref 0.0–0.2)

## 2021-02-14 LAB — BRAIN NATRIURETIC PEPTIDE: B Natriuretic Peptide: 197.8 pg/mL — ABNORMAL HIGH (ref 0.0–100.0)

## 2021-02-14 LAB — PROCALCITONIN: Procalcitonin: <0.1 ng/mL

## 2021-02-14 LAB — URINALYSIS, ROUTINE W REFLEX MICROSCOPIC
Bilirubin Urine: NEGATIVE
Glucose, UA: NEGATIVE mg/dL
Hgb urine dipstick: NEGATIVE
Ketones, ur: NEGATIVE mg/dL
Leukocytes,Ua: NEGATIVE
Nitrite: NEGATIVE
Protein, ur: NEGATIVE mg/dL
Specific Gravity, Urine: 1.009 (ref 1.005–1.030)
pH: 6 (ref 5.0–8.0)

## 2021-02-14 LAB — TROPONIN I (HIGH SENSITIVITY)
Troponin I (High Sensitivity): 51 ng/L — ABNORMAL HIGH (ref <18)
Troponin I (High Sensitivity): 51 ng/L — ABNORMAL HIGH (ref <18)
Troponin I (High Sensitivity): 50 ng/L — ABNORMAL HIGH (ref <18)
Troponin I (High Sensitivity): 47 ng/L — ABNORMAL HIGH (ref <18)

## 2021-02-14 LAB — COMPREHENSIVE METABOLIC PANEL
ALT: 19 U/L (ref 0–44)
AST: 22 U/L (ref 15–41)
Albumin: 3.6 g/dL (ref 3.5–5.0)
Alkaline Phosphatase: 51 U/L (ref 38–126)
Anion gap: 12 (ref 5–15)
BUN: 45 mg/dL — ABNORMAL HIGH (ref 8–23)
CO2: 23 mmol/L (ref 22–32)
Calcium: 9.5 mg/dL (ref 8.9–10.3)
Chloride: 96 mmol/L — ABNORMAL LOW (ref 98–111)
Creatinine, Ser: 1.99 mg/dL — ABNORMAL HIGH (ref 0.61–1.24)
GFR, Estimated: 33 mL/min — ABNORMAL LOW (ref >60)
Glucose, Bld: 116 mg/dL — ABNORMAL HIGH (ref 70–99)
Potassium: 4.2 mmol/L (ref 3.5–5.1)
Sodium: 131 mmol/L — ABNORMAL LOW (ref 135–145)
Total Bilirubin: 1 mg/dL (ref 0.3–1.2)
Total Protein: 7.5 g/dL (ref 6.5–8.1)

## 2021-02-14 LAB — RESP PANEL BY RT-PCR (FLU A&B, COVID) ARPGX2
Influenza A by PCR: NEGATIVE
Influenza B by PCR: NEGATIVE
SARS Coronavirus 2 by RT PCR: NEGATIVE

## 2021-02-14 LAB — LACTIC ACID, PLASMA
Lactic Acid, Venous: 2.3 mmol/L (ref 0.5–1.9)
Lactic Acid, Venous: 1.8 mmol/L (ref 0.5–1.9)

## 2021-02-14 LAB — SEDIMENTATION RATE: Sed Rate: 34 mm/hr — ABNORMAL HIGH (ref 0–20)

## 2021-02-14 LAB — C-REACTIVE PROTEIN: CRP: 0.9 mg/dL (ref <1.0)

## 2021-02-14 MED ORDER — FLUTICASONE FUROATE-VILANTEROL 100-25 MCG/ACT IN AEPB
1.0000 | INHALATION_SPRAY | Freq: Every day | RESPIRATORY_TRACT | Status: DC
  Administered 2021-02-14 – 2021-02-18 (×5): 1 via RESPIRATORY_TRACT
  Filled 2021-02-14: qty 28

## 2021-02-14 MED ORDER — HYDRALAZINE HCL 20 MG/ML IJ SOLN: 5.0000 mg | INTRAMUSCULAR | Status: DC | PRN

## 2021-02-14 MED ORDER — SODIUM CHLORIDE 0.9 % IV SOLN: Freq: Once | INTRAVENOUS | Status: DC

## 2021-02-14 MED ORDER — FLUTICASONE-UMECLIDIN-VILANT 100-62.5-25 MCG/ACT IN AEPB: 1.0000 | INHALATION_SPRAY | Freq: Every day | RESPIRATORY_TRACT | Status: DC

## 2021-02-14 MED ORDER — ADULT MULTIVITAMIN W/MINERALS CH
1.0000 | ORAL_TABLET | Freq: Every day | ORAL | Status: DC
  Administered 2021-02-14 – 2021-02-18 (×5): 1 via ORAL
  Filled 2021-02-14 (×5): qty 1

## 2021-02-14 MED ORDER — SODIUM CHLORIDE 0.9 % IV SOLN: INTRAVENOUS | Status: DC

## 2021-02-14 MED ORDER — PANTOPRAZOLE SODIUM 40 MG PO TBEC
40.0000 mg | DELAYED_RELEASE_TABLET | Freq: Every day | ORAL | Status: DC
  Administered 2021-02-14 – 2021-02-18 (×5): 40 mg via ORAL
  Filled 2021-02-14 (×5): qty 1

## 2021-02-14 MED ORDER — LEVOTHYROXINE SODIUM 88 MCG PO TABS
88.0000 ug | ORAL_TABLET | Freq: Every day | ORAL | Status: DC
  Administered 2021-02-15 – 2021-02-18 (×4): 88 ug via ORAL
  Filled 2021-02-14 (×4): qty 1

## 2021-02-14 MED ORDER — MULTI-VITAMINS PO TABS: 1.0000 | ORAL_TABLET | Freq: Every day | ORAL | Status: DC

## 2021-02-14 MED ORDER — UMECLIDINIUM BROMIDE 62.5 MCG/ACT IN AEPB
1.0000 | INHALATION_SPRAY | Freq: Every day | RESPIRATORY_TRACT | Status: DC
  Administered 2021-02-14 – 2021-02-18 (×5): 1 via RESPIRATORY_TRACT
  Filled 2021-02-14: qty 7

## 2021-02-14 MED ORDER — ACETAMINOPHEN 325 MG PO TABS
650.0000 mg | ORAL_TABLET | Freq: Four times a day (QID) | ORAL | Status: DC | PRN
  Administered 2021-02-14 – 2021-02-18 (×4): 650 mg via ORAL
  Filled 2021-02-14 (×3): qty 2

## 2021-02-14 MED ORDER — SODIUM CHLORIDE 0.9 % IV SOLN
2.0000 g | Freq: Once | INTRAVENOUS | Status: AC
  Administered 2021-02-14: 2 g via INTRAVENOUS
  Filled 2021-02-14: qty 20

## 2021-02-14 MED ORDER — ONDANSETRON HCL 4 MG/2ML IJ SOLN
4.0000 mg | Freq: Three times a day (TID) | INTRAMUSCULAR | Status: DC | PRN
  Administered 2021-02-14: 4 mg via INTRAVENOUS
  Filled 2021-02-14: qty 2

## 2021-02-14 MED ORDER — OMEGA-3-ACID ETHYL ESTERS 1 G PO CAPS
1.0000 g | ORAL_CAPSULE | Freq: Every day | ORAL | Status: DC
  Administered 2021-02-14 – 2021-02-18 (×5): 1 g via ORAL
  Filled 2021-02-14 (×6): qty 1

## 2021-02-14 MED ORDER — METHOCARBAMOL 500 MG PO TABS
500.0000 mg | ORAL_TABLET | Freq: Three times a day (TID) | ORAL | Status: DC | PRN
  Administered 2021-02-14: 500 mg via ORAL
  Filled 2021-02-14 (×2): qty 1

## 2021-02-14 MED ORDER — DM-GUAIFENESIN ER 30-600 MG PO TB12
1.0000 | ORAL_TABLET | Freq: Two times a day (BID) | ORAL | Status: DC | PRN
  Administered 2021-02-14: 1 via ORAL
  Filled 2021-02-14: qty 1

## 2021-02-14 MED ORDER — LORATADINE 10 MG PO TABS
10.0000 mg | ORAL_TABLET | Freq: Every day | ORAL | Status: DC
  Administered 2021-02-14 – 2021-02-18 (×5): 10 mg via ORAL
  Filled 2021-02-14 (×5): qty 1

## 2021-02-14 MED ORDER — POLYETHYLENE GLYCOL 3350 17 G PO PACK
17.0000 g | PACK | Freq: Every day | ORAL | Status: DC | PRN
  Administered 2021-02-16 – 2021-02-18 (×2): 17 g via ORAL
  Filled 2021-02-14 (×2): qty 1

## 2021-02-14 MED ORDER — AMIODARONE HCL 200 MG PO TABS
200.0000 mg | ORAL_TABLET | Freq: Every day | ORAL | Status: DC
  Administered 2021-02-14 – 2021-02-18 (×5): 200 mg via ORAL
  Filled 2021-02-14 (×5): qty 1

## 2021-02-14 MED ORDER — SODIUM CHLORIDE 0.9 % IV SOLN
INTRAVENOUS | Status: DC
Start: 2021-02-14 — End: 2021-02-14

## 2021-02-14 MED ORDER — SENNOSIDES-DOCUSATE SODIUM 8.6-50 MG PO TABS: 1.0000 | ORAL_TABLET | Freq: Every day | ORAL | Status: DC | PRN

## 2021-02-14 MED ORDER — METOPROLOL TARTRATE 50 MG PO TABS
100.0000 mg | ORAL_TABLET | Freq: Two times a day (BID) | ORAL | Status: DC
  Administered 2021-02-14 – 2021-02-15 (×3): 100 mg via ORAL
  Filled 2021-02-14 (×4): qty 2

## 2021-02-14 MED ORDER — ALBUTEROL SULFATE HFA 108 (90 BASE) MCG/ACT IN AERS
2.0000 | INHALATION_SPRAY | RESPIRATORY_TRACT | Status: DC | PRN
Start: 2021-02-14 — End: 2021-02-18
  Filled 2021-02-14: qty 6.7

## 2021-02-14 MED ORDER — RIVAROXABAN 15 MG PO TABS
15.0000 mg | ORAL_TABLET | Freq: Every day | ORAL | Status: DC
  Administered 2021-02-14 – 2021-02-18 (×5): 15 mg via ORAL
  Filled 2021-02-14 (×5): qty 1

## 2021-02-14 MED ORDER — SODIUM CHLORIDE 0.9 % IV SOLN
2.0000 g | INTRAVENOUS | Status: DC
  Administered 2021-02-15: 2 g via INTRAVENOUS
  Filled 2021-02-14 (×2): qty 20

## 2021-02-14 MED ORDER — OMEGA 3 1000 MG PO CAPS: ORAL_CAPSULE | Freq: Every day | ORAL | Status: DC

## 2021-02-14 NOTE — ED Triage Notes (Signed)
Pt BIB EMS from home with c/o SOB and weakness x 1 week.

## 2021-02-14 NOTE — ED Triage Notes (Signed)
FIRST NURSE NOTE:  pt arrived via [facility] from home, c/o increased weakness x 1 week, SO at home states they are not able to take care of him and needs to be admitted to figure out what is wrong. Pt is able to stand to pivot with a lot of assistance. Pt has hx of afib.   134/96 p-80 98% RA

## 2021-02-14 NOTE — H&P (Addendum)
° °History and Physical  ° ° °Daniel L Ode Jr. MRN:[id number] DOB: 07/18/1940 DOA: 02/14/2021 ° °Referring MD/NP/PA:  ° °PCP: Anderson, Marshall W, MD  ° °Patient coming from:  The patient is coming from home.  At baseline, pt is independent for most of ADL.       ° °Chief Complaint: left lower leg pain ° °HPI: Daniel L Hsia Jr. is a 80 y.o. male with medical history significant of hypertension, hyperlipidemia, COPD, stroke, GERD, hypothyroidism, CKD stage IIIa, sCHF, atrial fibrillation on Xarelto, OSA, who presents with left lower leg pain. ° °Pt states that he struck his left leg and sustained a skin tear on Christmas eve. Since then, he has had increasing pain and redness around the area.  The pain is constant, moderate, sharp, nonradiating.  No fever or chills.  Patient was started on Keflex by his doctor on 12/26, without improvement. Patient also reports shortness of breath and generalized weakness.  I spoke with his friend on the phone.  Per his friend who is living him, pt is very weak. She can not take care of pt at home. Pt has home health coming twice a week, but his friend said patient needs more help from now. No cough and chest pain.  Denies nausea vomiting, diarrhea or abdominal pain.  Patient has constipation.  No symptoms of UTI.  Patient has right leg muscle spasm. ° °ED Course: pt was found to have WBC 8.4, troponin level 51, negative COVID PCR, negative urinalysis, BMP 197, worsening renal function, temperature normal, blood pressure 121/70, heart rate 92, RR 18, oxygen saturation 99% on room air.  Chest x-ray negative.  Patient is placed on MedSurg bed observation ° ° °Review of Systems:  ° °General: no fevers, chills, no body weight gain, has fatigue °HEENT: no blurry vision, hearing changes or sore throat °Respiratory: has dyspnea, no coughing, wheezing °CV: no chest pain, no palpitations °GI: no nausea, vomiting, abdominal pain, diarrhea, constipation °GU: no dysuria, burning on urination,  increased urinary frequency, hematuria  °Ext: has leg edema °Neuro: no unilateral weakness, numbness, or tingling, no vision change or hearing loss °Skin: has skin tear in left lower leg. °MSK:  no deformity, no limitation of range of movement in spin.  Has right leg muscle spasm. °Heme: No easy bruising.  °Travel history: No recent long distant travel. ° °Allergy:  °Allergies  °Allergen Reactions  ° Diltiazem Hcl Itching and Other (See Comments)  °  edema  ° ° °Past Medical History:  °Diagnosis Date  ° A-fib (HCC)   ° Cellulitis   ° CHF (congestive heart failure) (HCC)   ° COPD (chronic obstructive pulmonary disease) (HCC)   ° Difficult intubation   ° Hyperlipidemia   ° Hypertension   ° Hypothyroidism   ° Kidney disease   ° Sleep apnea   ° Stroke (HCC)   ° Thyroid disease   ° ° °Past Surgical History:  °Procedure Laterality Date  ° CARDIAC CATHETERIZATION    ° CATARACT EXTRACTION W/ INTRAOCULAR LENS  IMPLANT, BILATERAL    ° COLONOSCOPY  2008  ° HERNIA REPAIR    ° bilateral inguinal hernia/ [facility]  ° HERNIA REPAIR  02/02/2015  ° 18 x 28 cm ventral light mesh placed laparoscopically.  ° TONSILLECTOMY    ° UMBILICAL HERNIA REPAIR N/A 02/02/2015  ° Procedure: HERNIA REPAIR UMBILICAL ADULT;  Surgeon: Jeffrey W Byrnett, MD;  Location: [facility] ORS;  Service: General;  Laterality: N/A;  ° VENTRAL HERNIA REPAIR N/A 02/02/2015  °   Procedure: LAPAROSCOPIC VENTRAL HERNIA;  Surgeon: Jeffrey W Byrnett, MD;  Location: [facility] ORS;  Service: General;  Laterality: N/A;  ° vp shunt placement  1979  ° ° °Social History:  reports that he has quit smoking. He has never used smokeless tobacco. He reports that he does not drink alcohol and does not use drugs. ° °Family History:  °Family History  °Problem Relation Age of Onset  ° Heart disease Mother   ° Alcohol abuse Father   °  ° °Prior to Admission medications   °Medication Sig Start Date End Date Taking? Authorizing Provider  °amiodarone (PACERONE) 200 MG tablet Take 200 mg by mouth daily.   02/20/14   [provider]  °Fluticasone-Umeclidin-Vilant (TRELEGY ELLIPTA) 100-62.5-25 MCG/INH AEPB Inhale 1 puff into the lungs daily.    [provider]  °levothyroxine (SYNTHROID) 88 MCG tablet Take 88 mcg by mouth daily.  02/20/14   [provider]  °loratadine (CLARITIN) 10 MG tablet Take 10 mg by mouth daily.    [provider]  °metolazone (ZAROXOLYN) 2.5 MG tablet Take 2.5 mg by mouth 2 (two) times a week. Monday and Friday 10/29/18   [provider]  °metoprolol (LOPRESSOR) 100 MG tablet Take 100 mg by mouth 2 (two) times daily.  02/20/14   [provider]  °Multiple Vitamin (MULTI-VITAMINS) TABS Take 1 tablet by mouth daily.     [provider]  °Omega-3 Fatty Acids (OMEGA 3 PO) Take 2 capsules by mouth daily.    [provider]  °omeprazole (PRILOSEC) 40 MG capsule Take 40 mg by mouth daily.  02/20/14   [provider]  °polyethylene glycol (MIRALAX / GLYCOLAX) 17 g packet Take 17 g by mouth daily as needed. 08/03/18   Ojie, Jude, MD  °potassium chloride (KLOR-CON) 10 MEQ tablet Take 1 tablet (10 mEq total) by mouth 3 (three) times daily. 12/14/18   Mayo, Katy Dodd, MD  °potassium chloride (MICRO-K) 10 MEQ CR capsule TAKE 2 CAPSULES (20 MEQ TOTAL) BY MOUTH 3 (THREE) TIMES DAILY 05/10/18   [provider]  °rivaroxaban (XARELTO) 20 MG TABS tablet Take 1 tablet (20 mg total) by mouth daily with supper. 12/14/18   Mayo, Katy Dodd, MD  °spironolactone (ALDACTONE) 25 MG tablet Take 1 tablet (25 mg total) by mouth daily. 12/15/18   Mayo, Katy Dodd, MD  °torsemide (DEMADEX) 20 MG tablet Take 1 tablet (20 mg total) by mouth daily. 08/03/18   Ojie, Jude, MD  ° ° °Physical Exam: °Vitals:  ° 02/14/21 0818 02/14/21 1100 02/14/21 1500 02/14/21 1605  °BP: 121/70 120/71 106/63 112/63  °Pulse: 92  90 95  °Resp: 18 20 20 20  °Temp:   98.2 °F (36.8 °C) 98.2 °F (36.8 °C)  °TempSrc:   Oral Oral  °SpO2: 99%  99% 98%  °Weight:      °Height:       ° °General: Not in acute distress °HEENT: °      Eyes: PERRL, EOMI, no scleral icterus. °      ENT: No discharge from the ears and nose, no pharynx injection, no tonsillar enlargement.  °      Neck: No JVD, no bruit, no mass felt. °Heme: No neck lymph node enlargement. °Cardiac: S1/S2, RRR, No murmurs, No gallops or rubs. °Respiratory: No rales, wheezing, rhonchi or rubs. °GI: Soft, nondistended, nontender, no rebound pain, no organomegaly, BS present. °GU: No hematuria °Ext: has trace leg edema bilaterally. 1+DP/PT pulse bilaterally. °Musculoskeletal: No joint deformities, No   joint redness or warmth, no limitation of ROM in spin. °Skin: has a small wound in left lower leg with little pus drainage, with surrounding erythema, chronic venous insufficiency change, tenderness and warmth ° ° ° ° °Neuro: Alert, oriented X3, cranial nerves II-XII grossly intact, moves all extremities normally. °Psych: Patient is not psychotic, no suicidal or hemocidal ideation. ° °Labs on Admission: I have personally reviewed following labs and imaging studies ° °CBC: °Recent Labs  °Lab 02/14/21 °0615  °WBC 8.4  °NEUTROABS 6.5  °HGB 9.3*  °HCT 32.7*  °MCV 84.1  °PLT 339  ° °Basic Metabolic Panel: °Recent Labs  °Lab 02/14/21 °0615  °NA 131*  °K 4.2  °CL 96*  °CO2 23  °GLUCOSE 116*  °BUN 45*  °CREATININE 1.99*  °CALCIUM 9.5  ° °GFR: °Estimated Creatinine Clearance: 39.8 mL/min (A) (by C-G formula based on SCr of 1.99 mg/dL (H)). °Liver Function Tests: °Recent Labs  °Lab 02/14/21 °0615  °AST 22  °ALT 19  °ALKPHOS 51  °BILITOT 1.0  °PROT 7.5  °ALBUMIN 3.6  ° °No results for input(s): LIPASE, AMYLASE in the last 168 hours. °No results for input(s): AMMONIA in the last 168 hours. °Coagulation Profile: °No results for input(s): INR, PROTIME in the last 168 hours. °Cardiac Enzymes: °No results for input(s): CKTOTAL, CKMB, CKMBINDEX, TROPONINI in the last 168 hours. °BNP (last 3 results) °No results for input(s): PROBNP in the last 8760  hours. °HbA1C: °No results for input(s): HGBA1C in the last 72 hours. °CBG: °No results for input(s): GLUCAP in the last 168 hours. °Lipid Profile: °No results for input(s): CHOL, HDL, LDLCALC, TRIG, CHOLHDL, LDLDIRECT in the last 72 hours. °Thyroid Function Tests: °No results for input(s): TSH, T4TOTAL, FREET4, T3FREE, THYROIDAB in the last 72 hours. °Anemia Panel: °No results for input(s): VITAMINB12, FOLATE, FERRITIN, TIBC, IRON, RETICCTPCT in the last 72 hours. °Urine analysis: °   °Component Value Date/Time  ° COLORURINE STRAW (A) 02/14/2021 0616  ° APPEARANCEUR CLEAR (A) 02/14/2021 0616  ° LABSPEC 1.009 02/14/2021 0616  ° PHURINE 6.0 02/14/2021 0616  ° GLUCOSEU NEGATIVE 02/14/2021 0616  ° HGBUR NEGATIVE 02/14/2021 0616  ° BILIRUBINUR NEGATIVE 02/14/2021 0616  ° KETONESUR NEGATIVE 02/14/2021 0616  ° PROTEINUR NEGATIVE 02/14/2021 0616  ° NITRITE NEGATIVE 02/14/2021 0616  ° LEUKOCYTESUR NEGATIVE 02/14/2021 0616  ° °Sepsis Labs: °@LABRCNTIP(procalcitonin:4,lacticidven:4) °) °Recent Results (from the past 240 hour(s))  °Resp Panel by RT-PCR (Flu A&B, Covid) Nasopharyngeal Swab     Status: None  ° Collection Time: 02/14/21  6:16 AM  ° Specimen: Nasopharyngeal Swab; Nasopharyngeal(NP) swabs in vial transport medium  °Result Value Ref Range Status  ° SARS Coronavirus 2 by RT PCR NEGATIVE NEGATIVE Final  °  Comment: (NOTE) °SARS-CoV-2 target nucleic acids are NOT DETECTED. ° °The SARS-CoV-2 RNA is generally detectable in upper respiratory °specimens during the acute phase of infection. The lowest °concentration of SARS-CoV-2 viral copies this assay can detect is °138 copies/mL. A negative result does not preclude SARS-Cov-2 °infection and should not be used as the sole basis for treatment or °other patient management decisions. A negative result may occur with  °improper specimen collection/handling, submission of specimen other °than nasopharyngeal swab, presence of viral mutation(s) within the °areas targeted by this  assay, and inadequate number of viral °copies(<138 copies/mL). A negative result must be combined with °clinical observations, patient history, and epidemiological °information. The expected result is Negative. ° °Fact Sheet for Patients:  °https://www.fda.gov/media/152166/download ° °Fact Sheet for Healthcare Providers:  °https://www.fda.gov/media/152162/download ° °This test   This test is no t yet approved or cleared by the [place] and  has been authorized for detection and/or diagnosis of SARS-CoV-2 by FDA under an Emergency Use Authorization (EUA). This EUA will remain  in effect (meaning this test can be used) for the duration of the COVID-19 declaration under Section 564(b)(1) of the Act, 21 U.S.C.section 360bbb-3(b)(1), unless the authorization is terminated  or revoked sooner.       Influenza A by PCR NEGATIVE NEGATIVE Final   Influenza B by PCR NEGATIVE NEGATIVE Final    Comment: (NOTE) The Xpert Xpress SARS-CoV-2/FLU/RSV plus assay is intended as an aid in the diagnosis of influenza from Nasopharyngeal swab specimens and should not be used as a sole basis for treatment. Nasal washings and aspirates are unacceptable for Xpert Xpress SARS-CoV-2/FLU/RSV testing.  Fact Sheet for Patients: EntrepreneurPulse.com.au  Fact Sheet for Healthcare Providers: IncredibleEmployment.be  This test is not yet approved or cleared by the [place] FDA and has been authorized for detection and/or diagnosis of SARS-CoV-2 by FDA under an Emergency Use Authorization (EUA). This EUA will remain in effect (meaning this test can be used) for the duration of the COVID-19 declaration under Section 564(b)(1) of the Act, 21 U.S.C. section 360bbb-3(b)(1), unless the authorization is terminated or revoked.  Performed at [facility], [address] [place], [place] [address]      Radiological Exams on Admission: US Venous Img Lower Unilateral Left  (DVT)  Result Date: 02/14/2021 CLINICAL DATA:  Cellulitis, edema x1 week EXAM: LEFT LOWER EXTREMITY VENOUS DOPPLER ULTRASOUND TECHNIQUE: Gray-scale sonography with graded compression, as well as color Doppler and duplex ultrasound were performed to evaluate the lower extremity deep venous systems from the level of the common femoral vein and including the common femoral, femoral, profunda femoral, popliteal and calf veins including the posterior tibial, peroneal and gastrocnemius veins when visible. The superficial great saphenous vein was also interrogated. Spectral Doppler was utilized to evaluate flow at rest and with distal augmentation maneuvers in the common femoral, femoral and popliteal veins. COMPARISON:  None. FINDINGS: Contralateral Common Femoral Vein: Respiratory phasicity is normal and symmetric with the symptomatic side. No evidence of thrombus. Normal compressibility. Common Femoral Vein: No evidence of thrombus. Normal compressibility, respiratory phasicity and response to augmentation. Saphenofemoral Junction: No evidence of thrombus. Normal compressibility and flow on color Doppler imaging. Profunda Femoral Vein: No evidence of thrombus. Normal compressibility and flow on color Doppler imaging. Femoral Vein: No evidence of thrombus. Normal compressibility, respiratory phasicity and response to augmentation. Popliteal Vein: No evidence of thrombus. Normal compressibility, respiratory phasicity and response to augmentation. Calf Veins: Unable to be evaluated due to wound/dressings. Other Findings:  None. IMPRESSION: No evidence of deep venous thrombosis above the level of the knee. The calf veins were unable to be evaluated due to wound/dressings. Electronically Signed   By: Albin Felling M.D.   On: 02/14/2021 12:04   DG Chest Port 1 View  Result Date: 02/14/2021 CLINICAL DATA:  SOB and weakness. EXAM: PORTABLE CHEST 1 VIEW COMPARISON:  03/01/2016 FINDINGS: Cardiac enlargement is unchanged. No  pleural effusion or edema. No airspace opacities identified visualized osseous structures appear intact. IMPRESSION: No acute cardiopulmonary abnormalities. Electronically Signed   By: Kerby Moors M.D.   On: 02/14/2021 07:09     EKG: I have personally reviewed.  Atrial fibrillation, QTC 482, borderline LAD, nonspecific T wave change   Assessment/Plan Principal Problem:   Cellulitis of left lower extremity Active Problems:  HCC) °  Essential hypertension °  COPD (chronic obstructive pulmonary disease) (HCC) °  Hypothyroidism °  Stroke (HCC) °  Acute renal failure superimposed on stage 3a chronic kidney disease (HCC) °  Elevated troponin °  Normocytic anemia °  Generalized weakness °  Acute renal failure superimposed on chronic kidney disease (HCC) ° ° °Cellulitis of left lower extremity: Patient does not have fever or leukocytosis.  Does not meet criteria for sepsis, but his lactic acid is 2.3 ° °- Admitted to MedSurg bed as inpatient °- Empiric antimicrobial treatment with Rocephin °- Blood cultures x 2  °- ESR and CRP °- wound care consult °- NS 50 cc/h ° °Atrial fibrillation (HCC) °-Metoprolol, Xarelto, amiodarone ° °Essential hypertension °-IV hydralazine as needed °-Metoprolol ° °COPD (chronic obstructive pulmonary disease) (HCC) °-Bronchodilators ° °Hypothyroidism °-Synthroid ° °Stroke (HCC) °-Patient is on Xarelto for A. Fib ° °Acute renal failure superimposed on stage 3a chronic kidney disease (HCC): Baseline creatinine 1.21 on 12/14/2018.  His creatinine is 1.99, BUN 45.  Likely due to dehydration and continuation of diuretics. °-Hold torsemide, spironolactone, metolazone °-gentle IVF  ° °Elevated troponin: No chest pain.  Troponin level 51.  Likely demand ischemia °-Trend troponin °-Check A1c, FLP ° °Normocytic anemia: Hemoglobin 9.3 (11.1 on 12/11/2020), slightly dropped.  No active bleeding °-Repeat CBC in morning ° °Generalized weakness: Most likely multifactorial  etiology. °-PT/OT °-Consult to TOC for possible rehab or home health need ° ° ° ° ° °DVT ppx: on Xarelto °Code Status: Full code °Family Communication: Yes, patient's lady friend who is living with pt, by phone °Disposition Plan:  to be determined °Consults called:  none °Admission status and Level of care: Telemetry Medical:    for obs  ° ° °Status is: Inpatient ° °Remains inpatient appropriate because: Patient has multiple comorbidities, now presents with cellulitis in left lower leg, also has severe generalized weakness, elevated troponin, and worsening renal function.  His presentation is highly complicated.  Patient is at high risk of deteriorating.  Will need to be treated in the hospital for at least 2 days ° ° ° ° ° ° ° ° ° ° ° ° ° ° ° ° °Date of Service 02/14/2021  ° ° °  °[facility] ° ° °If 7PM-7AM, please contact night-coverage [email or web address] °02/14/2021, 6:15 PM ° °

## 2021-02-14 NOTE — Progress Notes (Signed)
Date and time results received: 02/14/21 1845 (use smartphrase ".now" to insert current time)  Test: Lactic Acid    Critical Value: 3.2  Name of Provider Notified: Dr. Blaine Hamper  Orders Received? Or Actions Taken?:  Provider paged

## 2021-02-14 NOTE — Progress Notes (Signed)
OT Cancellation Note  Patient Details Name: Daniel Eaton. MRN: [id number] DOB: 08-Jul-1940   Cancelled Treatment:    Reason Eval/Treat Not Completed: Patient declined, no reason specified;Fatigue/lethargy limiting ability to participate. Order received and chart reviewed. Upon arrival pt in stretcher, reports fatigue and LLE pain. Pt politely defers evaluation this date requesting mobility assessment next date. Will initiate as able.   Dessie Coma, M.S. OTR/L  02/14/21, 1:43 PM  ascom [phone]

## 2021-02-14 NOTE — ED Provider Notes (Addendum)
[facility] Provider Note    Event Date/Time   First MD Initiated Contact with Patient 02/14/21 [id number]     (approximate)   History   Shortness of Breath   HPI  Daniel Eaton. is a 81 y.o. male with past medical history of hypertension, hyperlipidemia, sleep apnea, prior stroke, COPD, CHF, A. fib, on anticoagulants, here with generalized weakness.  The patient states that for the last week, he has had progressively worsening generalized weakness.  This occurs in the setting of approximately 1 month of progressive weakness.  He states that on Christmas eve, he struck his left leg and sustained a skin wound.  Since then, he has had increasing pain and redness around the area.  He has been taking Keflex as an outpatient.  He has been having the wound dressed every other day but states that it has been mildly more painful despite this.  He states that he has had associated increasing weakness and has gone from being able to support himself with a walker to being unable to walk at all.  He reports aching, throbbing, pain in his left leg.  He endorses generalized fatigue.  He said decreased appetite.  He has been taking his medications otherwise as prescribed.  No other changes in his health.  No falls.     Physical Exam   Triage Vital Signs: ED Triage Vitals  Enc Vitals Group     BP 02/14/21 0612 116/84     Pulse Rate 02/14/21 0612 92     Resp 02/14/21 0612 18     Temp 02/14/21 0612 98.1 F (36.7 C)     Temp src --      SpO2 02/14/21 0612 100 %     Weight 02/14/21 0609 275 lb (124.7 kg)     Height 02/14/21 0609 5\' 11"  (1.803 m)     Head Circumference --      Peak Flow --      Pain Score 02/14/21 0609 4     Pain Loc --      Pain Edu? --      Excl. in [place]? --     Most recent vital signs: Vitals:   02/14/21 0612 02/14/21 0818  BP: 116/84 121/70  Pulse: 92 92  Resp: 18 18  Temp: 98.1 F (36.7 C)   SpO2: 100% 99%     General: Awake, no distress.   CV:  Good peripheral perfusion.  Regular rate and rhythm.  No murmurs. Resp:  Normal effort.  No rales. Abd:  No distention.  Limited by habitus Other:  Left lower extremity with superficial skin tear to the anterior shin with moderate surrounding erythema streaking up towards the knee.  There is no fluctuance.  No expressible purulence.  1+ edema bilaterally.  Intact DP pulses bilaterally.  Strength out of 5 bilateral lower extremities though generally weak subjectively.   ED Results / Procedures / Treatments   Labs (all labs ordered are listed, but only abnormal results are displayed) Labs Reviewed  COMPREHENSIVE METABOLIC PANEL - Abnormal; Notable for the following components:      Result Value   Sodium 131 (*)    Chloride 96 (*)    Glucose, Bld 116 (*)    BUN 45 (*)    Creatinine, Ser 1.99 (*)    GFR, Estimated 33 (*)    All other components within normal limits  BRAIN NATRIURETIC PEPTIDE - Abnormal; Notable for the following components:  B Natriuretic Peptide 197.8 (*)    All other components within normal limits  CBC WITH DIFFERENTIAL/PLATELET - Abnormal; Notable for the following components:   RBC 3.89 (*)    Hemoglobin 9.3 (*)    HCT 32.7 (*)    MCH 23.9 (*)    MCHC 28.4 (*)    RDW 21.8 (*)    nRBC 0.5 (*)    Abs Immature Granulocytes 0.10 (*)    All other components within normal limits  URINALYSIS, ROUTINE W REFLEX MICROSCOPIC - Abnormal; Notable for the following components:   Color, Urine STRAW (*)    APPearance CLEAR (*)    All other components within normal limits  TROPONIN I (HIGH SENSITIVITY) - Abnormal; Notable for the following components:   Troponin I (High Sensitivity) 51 (*)    All other components within normal limits  RESP PANEL BY RT-PCR (FLU A&B, COVID) ARPGX2  CULTURE, BLOOD (ROUTINE X 2)  CULTURE, BLOOD (ROUTINE X 2)  TROPONIN I (HIGH SENSITIVITY)     EKG  Atrial fibrillation, ventricular rate 91.  QRS 1 2, QTc 42.  Slightly prolonged  QTC.  Nonspecific T wave changes.  No significant change from prior.  No ST elevations.   RADIOLOGY Chest x-ray: Reviewed by me, unremarkable with no evidence of significant CHF or edema.  Reviewed radiologist read in agreement.    PROCEDURES:  Critical Care performed: No  .1-3 Lead EKG Interpretation Performed by: Duffy Bruce, MD Authorized by: Duffy Bruce, MD     Interpretation: abnormal     ECG rate:  80-100   ECG rate assessment: normal     Rhythm: atrial fibrillation     Ectopy: none     Conduction: normal   Comments:     Indication: Generalized weakness   MEDICATIONS ORDERED IN ED: Medications  albuterol (VENTOLIN HFA) 108 (90 Base) MCG/ACT inhaler 2 puff (has no administration in time range)  cefTRIAXone (ROCEPHIN) 2 g in sodium chloride 0.9 % 100 mL IVPB (has no administration in time range)  0.9 %  sodium chloride infusion (has no administration in time range)     IMPRESSION / MDM / ASSESSMENT AND PLAN / ED COURSE  I reviewed the triage vital signs and the nursing notes.                              Differential diagnosis includes, but is not limited to, generalized weakness secondary to deconditioning with superimposed cellulitis of the left leg, AKI, worsening symptomatic CHF, occult pneumonia, unlikely stroke.   81 year old male with extensive past medical history as above including CHF, A. fib, hypertension, hyperlipidemia, here with generalized weakness.  On exam, the patient appears to have a superficial cellulitis of the left leg which I think is contributing to acute on chronic weakness due to overall general deconditioning.  I reviewed his imaging which shows a chest x-ray without evidence of pneumonia or CHF.  Reviewed his lab work which is remarkable for likely mild acute on chronic kidney injury with elevated creatinine compared to his baseline.  BUN to creatinine ratio is elevated consistent with likely prerenal etiology and he does endorse  decreased p.o. intake.  Reviewed his office visit from Dr. Ouida Sills on 12/29 in which she was prescribed Keflex and has been taking 4 times a day for his cellulitis.  Patient has also been seen previously for cellulitis and hospitalized for this.  Given patient's comorbidities with AKI and  generalized weakness and inability to care for himself with exam concerning for ongoing cellulitis despite outpatient Keflex, will admit for IV antibiotics, physical therapy, and further evaluation.  Patient lives with his elderly wife which will be difficult going home as he is currently unable to even stand or care for himself.  Will give IV Rocephin for broader coverage, send cultures.  No known history of MRSA and there is no evidence of abscess.  Very gentle fluids given in the setting of his AKI, though will be very cautious due to his CHF.      FINAL CLINICAL IMPRESSION(S) / ED DIAGNOSES   Final diagnoses:  Acute renal failure superimposed on chronic kidney disease, unspecified CKD stage, unspecified acute renal failure type ([place])  Cellulitis of left leg  Generalized weakness     Rx / DC Orders   ED Discharge Orders     None        Note:  This document was prepared using Dragon voice recognition software and may include unintentional dictation errors.   Duffy Bruce, MD 02/14/21 [place]    Duffy Bruce, MD 02/14/21 [phone]

## 2021-02-15 LAB — BASIC METABOLIC PANEL
Anion gap: 11 (ref 5–15)
BUN: 44 mg/dL — ABNORMAL HIGH (ref 8–23)
CO2: 25 mmol/L (ref 22–32)
Calcium: 8.8 mg/dL — ABNORMAL LOW (ref 8.9–10.3)
Chloride: 98 mmol/L (ref 98–111)
Creatinine, Ser: 1.94 mg/dL — ABNORMAL HIGH (ref 0.61–1.24)
GFR, Estimated: 34 mL/min — ABNORMAL LOW (ref >60)
Glucose, Bld: 82 mg/dL (ref 70–99)
Potassium: 3.9 mmol/L (ref 3.5–5.1)
Sodium: 134 mmol/L — ABNORMAL LOW (ref 135–145)

## 2021-02-15 LAB — CBC
HCT: 29.2 % — ABNORMAL LOW (ref 39.0–52.0)
Hemoglobin: 8.4 g/dL — ABNORMAL LOW (ref 13.0–17.0)
MCH: 24.3 pg — ABNORMAL LOW (ref 26.0–34.0)
MCHC: 28.8 g/dL — ABNORMAL LOW (ref 30.0–36.0)
MCV: 84.6 fL (ref 80.0–100.0)
Platelets: 319 10*3/uL (ref 150–400)
RBC: 3.45 MIL/uL — ABNORMAL LOW (ref 4.22–5.81)
RDW: 21.2 % — ABNORMAL HIGH (ref 11.5–15.5)
WBC: 7.3 10*3/uL (ref 4.0–10.5)
nRBC: 0.5 % — ABNORMAL HIGH (ref 0.0–0.2)

## 2021-02-15 LAB — LIPID PANEL
Cholesterol: 124 mg/dL (ref 0–200)
HDL: 44 mg/dL (ref >40)
LDL Cholesterol: 58 mg/dL (ref 0–99)
Total CHOL/HDL Ratio: 2.8 RATIO
Triglycerides: 110 mg/dL (ref <150)
VLDL: 22 mg/dL (ref 0–40)

## 2021-02-15 LAB — HEMOGLOBIN A1C
Hgb A1c MFr Bld: 5.6 % (ref 4.8–5.6)
Mean Plasma Glucose: 114.02 mg/dL

## 2021-02-15 MED ORDER — SILVER SULFADIAZINE 1 % EX CREA
TOPICAL_CREAM | Freq: Two times a day (BID) | CUTANEOUS | Status: DC
  Filled 2021-02-15: qty 85

## 2021-02-15 MED ORDER — METOPROLOL SUCCINATE ER 50 MG PO TB24
50.0000 mg | ORAL_TABLET | Freq: Every day | ORAL | Status: DC
  Administered 2021-02-16 – 2021-02-18 (×3): 50 mg via ORAL
  Filled 2021-02-15 (×3): qty 1

## 2021-02-15 NOTE — Progress Notes (Signed)
Daniel Eaton NAME: Daniel Eaton    MR#:  [id number]  DATE OF BIRTH:  24-Jun-1940  SUBJECTIVE:   came in after patient developed a skin tear on his left lower extremity after bumping into his bathtub. Had some bleeding. No fever. Seen by physical therapy recommends rehab. Patient had a lot of questions about discharge planning tried to answer most of them. No family at bedside REVIEW OF SYSTEMS:   Review of Systems  Constitutional:  Negative for chills, fever and weight loss.  HENT:  Negative for ear discharge, ear pain and nosebleeds.   Eyes:  Negative for blurred vision, pain and discharge.  Respiratory:  Negative for sputum production, shortness of breath, wheezing and stridor.   Cardiovascular:  Negative for chest pain, palpitations, orthopnea and PND.  Gastrointestinal:  Negative for abdominal pain, diarrhea, nausea and vomiting.  Genitourinary:  Negative for frequency and urgency.  Musculoskeletal:  Positive for joint pain. Negative for back pain.  Neurological:  Positive for weakness. Negative for sensory change, speech change and focal weakness.  Psychiatric/Behavioral:  Negative for depression and hallucinations. The patient is not nervous/anxious.   Tolerating Diet:yes Tolerating PT:   DRUG ALLERGIES:   Allergies  Allergen Reactions   Diltiazem Hcl Itching and Other (See Comments)    edema    VITALS:  Blood pressure (!) 126/45, pulse (!) 101, temperature 97.8 F (36.6 C), temperature source Oral, resp. rate 18, height 5\' 11"  (1.803 m), weight 124.7 kg, SpO2 96 %.  PHYSICAL EXAMINATION:   Physical Exam  GENERAL:  81 y.o.-year-old patient lying in the bed with no acute distress.  HEENT: Head atraumatic, normocephalic. Oropharynx and nasopharynx clear.  LUNGS: Normal breath sounds bilaterally, no wheezing, rales, rhonchi. No use of accessory muscles of respiration.  CARDIOVASCULAR: S1, S2 normal. No murmurs, rubs, or  gallops.  ABDOMEN: Soft, nontender, nondistended. Bowel sounds present. No organomegaly or mass.  EXTREMITIES:   NEUROLOGIC: nonfocal PSYCHIATRIC:  patient is alert and oriented x 3.  SKIN: as above  LABORATORY PANEL:  CBC Recent Labs  Lab 02/15/21 0432  WBC 7.3  HGB 8.4*  HCT 29.2*  PLT 319    Chemistries  Recent Labs  Lab 02/14/21 0615 02/15/21 0432  NA 131* 134*  K 4.2 3.9  CL 96* 98  CO2 23 25  GLUCOSE 116* 82  BUN 45* 44*  CREATININE 1.99* 1.94*  CALCIUM 9.5 8.8*  AST 22  --   ALT 19  --   ALKPHOS 51  --   BILITOT 1.0  --    Cardiac Enzymes No results for input(s): TROPONINI in the last 168 hours. RADIOLOGY:  US Venous Img Lower Unilateral Left (DVT)  Result Date: 02/14/2021 CLINICAL DATA:  Cellulitis, edema x1 week EXAM: LEFT LOWER EXTREMITY VENOUS DOPPLER ULTRASOUND TECHNIQUE: Gray-scale sonography with graded compression, as well as color Doppler and duplex ultrasound were performed to evaluate the lower extremity deep venous systems from the level of the common femoral vein and including the common femoral, femoral, profunda femoral, popliteal and calf veins including the posterior tibial, peroneal and gastrocnemius veins when visible. The superficial great saphenous vein was also interrogated. Spectral Doppler was utilized to evaluate flow at rest and with distal augmentation maneuvers in the common femoral, femoral and popliteal veins. COMPARISON:  None. FINDINGS: Contralateral Common Femoral Vein: Respiratory phasicity is normal and symmetric with the symptomatic side. No evidence of thrombus. Normal compressibility. Common Femoral Vein: No evidence  of thrombus. Normal compressibility, respiratory phasicity and response to augmentation. Saphenofemoral Junction: No evidence of thrombus. Normal compressibility and flow on color Doppler imaging. Profunda Femoral Vein: No evidence of thrombus. Normal compressibility and flow on color Doppler imaging. Femoral Vein: No  evidence of thrombus. Normal compressibility, respiratory phasicity and response to augmentation. Popliteal Vein: No evidence of thrombus. Normal compressibility, respiratory phasicity and response to augmentation. Calf Veins: Unable to be evaluated due to wound/dressings. Other Findings:  None. IMPRESSION: No evidence of deep venous thrombosis above the level of the knee. The calf veins were unable to be evaluated due to wound/dressings. Electronically Signed   By: Albin Felling M.D.   On: 02/14/2021 12:04   DG Chest Port 1 View  Result Date: 02/14/2021 CLINICAL DATA:  SOB and weakness. EXAM: PORTABLE CHEST 1 VIEW COMPARISON:  03/01/2016 FINDINGS: Cardiac enlargement is unchanged. No pleural effusion or edema. No airspace opacities identified visualized osseous structures appear intact. IMPRESSION: No acute cardiopulmonary abnormalities. Electronically Signed   By: Kerby Moors M.D.   On: 02/14/2021 07:09   ASSESSMENT AND PLAN:  Daniel Eaton. is a 81 y.o. male with medical history significant of hypertension, hyperlipidemia, COPD, stroke, GERD, hypothyroidism, CKD stage IIIa, sCHF, atrial fibrillation on Xarelto, OSA, who presents with left lower leg pain.  Cellulitis of left lower extremity--mild -Skin tear following trauma Bilateral Chronic venous stasis changes --: Patient does not have fever or leukocytosis.  Does not meet criteria for sepsis, but his lactic acid is 2.3 --Empiric antimicrobial treatment with Rocephin-- change to po in am - Blood cultures x 2 --neg - wound care consult--appreciate input   Atrial fibrillation (HCC) -Metoprolol, Xarelto, amiodarone   Essential hypertension -IV hydralazine as needed -Metoprolol   COPD (chronic obstructive pulmonary disease) ([place]) -Bronchodilators   Hypothyroidism -Synthroid   Stroke [facility]) -Patient is on Xarelto for A. Fib   Acute renal failure superimposed on stage 3a chronic kidney disease ([place]): Baseline creatinine 1.21--1.8  on  -- His creatinine is 1.99, BUN 45.  Likely due to dehydration and continuation of diuretics. -Hold torsemide, spironolactone, metolazone -received IVF     Generalized weakness: Most likely multifactorial etiology. -PT/OT-- recommends rehab            DVT ppx: on Xarelto Code Status: Full code Family Communication: none today Disposition Plan:  to be determined Consults called:  none Admission status and Level of care: Telemetry Medical:        Remains inpatient appropriate because: cellulitis and d/c planning        TOTAL TIME TAKING CARE OF THIS PATIENT: 25 minutes.  >50% time spent on counselling and coordination of care  Note: This dictation was prepared with Dragon dictation along with smaller phrase technology. Any transcriptional errors that result from this process are unintentional.  Fritzi Mandes M.D    [facility]   CC: Primary care physician; [name], MD Patient ID: Daniel Nan., male   DOB: [birth year], 81 y.o.   MRN: [id number]

## 2021-02-15 NOTE — Evaluation (Signed)
Occupational Therapy Evaluation Patient Details Name: Daniel Eaton. MRN: [id number] DOB: [birth year] Today's Date: 02/15/2021   History of Present Illness Patient presents to ED for LLE pain secondary to skin tear on christmas eve. Patient has PMH of HTN, HLD, COPD, stroke, GERD, hypothyroidism, CKD stage IIa, sCHF, a fib on Xarelto, OSA. Patient sees outpatient PT and has a home health aide.   Clinical Impression   Daniel Eaton was seen for OT evaluation this date. Prior to hospital admission, pt was receiving outpatient PT and mobilizing with BRW. Pt lives with spouse who works and has [employer] aid 2x/week for bathing/cleaning. Pt presents to acute OT demonstrating impaired ADL performance and functional mobility 2/2 decreased functional strength/ROM. Pt currently requires MAX A x2 + RW for ADL t/f. MAX A + RW pericare in standing. Anticipate MAX A for LBD at bed level. Pt would benefit from skilled OT to address noted impairments and functional limitations (see below for any additional details) in order to maximize safety and independence while minimizing falls risk and caregiver burden. Upon hospital discharge, recommend STR to maximize pt safety and return to PLOF.       Recommendations for follow up therapy are one component of a multi-disciplinary discharge planning process, led by the attending physician.  Recommendations may be updated based on patient status, additional functional criteria and insurance authorization.   Follow Up Recommendations  Skilled nursing-short term rehab (<3 hours/day)    Assistance Recommended at Discharge Frequent or constant Supervision/Assistance  Functional Status Assessment  Patient has had a recent decline in their functional status and demonstrates the ability to make significant improvements in function in a reasonable and predictable amount of time.  Equipment Recommendations  BSC/3in1    Recommendations for Other Services       Precautions /  Restrictions Precautions Precautions: Fall Precaution Comments: has LLE in gauze from wound Restrictions Weight Bearing Restrictions: No      Mobility Bed Mobility Overal bed mobility: Needs Assistance Bed Mobility: Supine to Sit;Sit to Supine     Supine to sit: Mod assist Sit to supine: Min assist   General bed mobility comments: additional time required, needs UE support for transitioning. pulls on PT and OT hands    Transfers Overall transfer level: Needs assistance Equipment used: Rolling walker (2 wheels) Transfers: Sit to/from Stand Sit to Stand: Max assist;+2 physical assistance           General transfer comment: unable to rise with x1 assist from standard height      Balance Overall balance assessment: Needs assistance Sitting-balance support: Feet supported Sitting balance-Leahy Scale: Fair Sitting balance - Comments: patient requires BLE on floor for sitting EOB   Standing balance support: Bilateral upper extremity supported;During functional activity Standing balance-Leahy Scale: Poor Standing balance comment: able to stand with BUE support, requires heavy reliance upon UE's in standing for side step and positioning. able to release UE for static stand for ~ 3 seconds                           ADL either performed or assessed with clinical judgement   ADL Overall ADL's : Needs assistance/impaired                                       General ADL Comments: MAX A x2 + RW for ADL t/f.  MAX A + RW pericare in standing. Anticipate MAX A for LBD at bed level.      Pertinent Vitals/Pain Pain Assessment: Faces Faces Pain Scale: Hurts a little bit Pain Location: LLE Pain Descriptors / Indicators: Aching;Discomfort;Dull Pain Intervention(s): Limited activity within patient's tolerance;Repositioned     Hand Dominance     Extremity/Trunk Assessment Upper Extremity Assessment Upper Extremity Assessment: Generalized weakness  (chronic limited BUE shoulder ROM ~90*)   Lower Extremity Assessment Lower Extremity Assessment: Defer to PT evaluation RLE Deficits / Details: grossly 3+/5 RLE Sensation: decreased light touch RLE Coordination: decreased gross motor LLE Deficits / Details: hip gross 3+/5; LE limited by wound/pain LLE: Unable to fully assess due to pain LLE Sensation: decreased light touch LLE Coordination: decreased gross motor       Communication Communication Communication: HOH   Cognition Arousal/Alertness: Awake/alert Behavior During Therapy: WFL for tasks assessed/performed Overall Cognitive Status: Within Functional Limits for tasks assessed                                 General Comments: Patient is alert and oriented x 4, very frustrated with limited mobility, frustrated about LLE wound, has multiple questions for doctor.     General Comments  distal aspect of LLE wrapped in gauze.    Exercises Other Exercises Other Exercises: Patient educated on role of PT and OT co-treat in acute care setting, safe sit to stand transfers, side stepping, and bed mobility.   Shoulder Instructions      Home Living Family/patient expects to be discharged to:: Private residence Living Arrangements: Spouse/significant other Available Help at Discharge: Family;Home health [facility] aide 2x/week) Type of Home: House Home Access: [place]: One level     Bathroom Shower/Tub: Occupational psychologist: [place]: Conservation officer, nature (2 wheels);BSC/3in1;Hand held shower head;Grab bars - tub/shower;Grab bars - toilet   Additional Comments: Patient uses bRW at baseline. Sleeps in recliner-has tried a hospital bed unsuccessfully.      Prior Functioning/Environment Prior Level of Function : Needs assist       Physical Assist : ADLs (physical)   ADLs (physical): Bathing Mobility Comments: Patient ambulates short distances with bRW at baseline,  is going to outpatient PT ADLs Comments: Patient has an aide come 2x/week for assistance with bathing and cleaning.        OT Problem List: Decreased strength;Decreased range of motion;Decreased activity tolerance;Impaired balance (sitting and/or standing);Decreased safety awareness      OT Treatment/Interventions: Self-care/ADL training;Therapeutic exercise;Energy conservation;DME and/or AE instruction;Therapeutic activities;Patient/family education;Balance training    OT Goals(Current goals can be found in the care plan section) Acute Rehab OT Goals Patient Stated Goal: to go home OT Goal Formulation: With patient Time For Goal Achievement: 03/01/21 Potential to Achieve Goals: Good  OT Frequency: Min 2X/week   Barriers to D/C: Decreased caregiver support          Co-evaluation PT/OT/SLP Co-Evaluation/Treatment: Yes Reason for Co-Treatment: Necessary to address cognition/behavior during functional activity;For patient/therapist safety;To address functional/ADL transfers PT goals addressed during session: Mobility/safety with mobility;Balance OT goals addressed during session: ADL's and self-care      AM-PAC OT "6 Clicks" Daily Activity     Outcome Measure Help from another person eating meals?: A Little Help from another person taking care of personal grooming?: A Little Help from another person toileting, which includes using toliet,  bedpan, or urinal?: A Lot Help from another person bathing (including washing, rinsing, drying)?: A Lot Help from another person to put on and taking off regular upper body clothing?: A Little Help from another person to put on and taking off regular lower body clothing?: A Lot 6 Click Score: 15   End of Session Equipment Utilized During Treatment: Gait belt;Rolling walker (2 wheels) Nurse Communication: Mobility status  Activity Tolerance: Patient tolerated treatment well Patient left: in bed;with call bell/phone within reach;with bed alarm  set  OT Visit Diagnosis: Other abnormalities of gait and mobility (R26.89);Muscle weakness (generalized) (M62.81)                Time: 5093-2671 OT Time Calculation (min): 41 min Charges:  OT General Charges $OT Visit: 1 Visit OT Evaluation $OT Eval Low Complexity: 1 Low OT Treatments $Self Care/Home Management : 23-37 mins  Dessie Coma, M.S. OTR/L  02/15/21, 2:14 PM  ascom [phone]

## 2021-02-15 NOTE — Progress Notes (Signed)
LLE dressing done per [facility] Nurse recs.

## 2021-02-15 NOTE — TOC Initial Note (Signed)
Transition of Care (TOC) - Initial/Assessment Note    Patient Details  Name: Daniel Eaton. MRN: [id number] Date of Birth: 1940/05/08  Transition of Care [facility]) CM/SW Contact:    [name], RN Phone Number: 02/15/2021, 2:01 PM  Clinical Narrative:                 Admitted for: Cellulitis Admitted from: home with significant other.  Significant other at bedside and states she does not feel like she can meet his needs at home PCP: [place] Current home health/prior home health/DME: BSC, RW, lift chair  Therapy recommending SNF. Patient agreeable, however does not want referral sent to [place] sent for signature Bed search initiated    Expected Discharge Plan: [place] Barriers to Discharge: Continued Medical Work up   Patient Goals and CMS Choice        Expected Discharge Plan and Services Expected Discharge Plan: [place]   Discharge Planning Services: CM Consult   Living arrangements for the past 2 months: Single Family Home                                      Prior Living Arrangements/Services Living arrangements for the past 2 months: Single Family Home Lives with:: Significant Other Patient language and need for interpreter reviewed:: Yes Do you feel safe going back to the place where you live?: Yes      Need for Family Participation in Patient Care: Yes (Comment) Care giver support system in place?: Yes (comment) Current home services: DME Criminal Activity/Legal Involvement Pertinent to Current Situation/Hospitalization: No - Comment as needed  Activities of Daily Living Home Assistive Devices/Equipment: CPAP, Grab bars around toilet, Grab bars in shower, Hand-held shower hose, Raised toilet seat with rails, Walker (specify type) ADL Screening (condition at time of admission) Patient's cognitive ability adequate to safely complete daily activities?:  Yes Is the patient deaf or have difficulty hearing?: No Does the patient have difficulty seeing, even when wearing glasses/contacts?: No Does the patient have difficulty concentrating, remembering, or making decisions?: No Patient able to express need for assistance with ADLs?: Yes Does the patient have difficulty dressing or bathing?: Yes Independently performs ADLs?: No Communication: Independent Dressing (OT): Independent Grooming: Independent Feeding: Independent Bathing: Needs assistance Is this a change from baseline?: Change from baseline, expected to last >3 days Toileting: Independent with device (comment) (toliet raiser) In/Out Bed: Needs assistance (sleeps in recliner) Is this a change from baseline?: Change from baseline, expected to last >3 days Does the patient have difficulty walking or climbing stairs?: Yes Weakness of Legs: Both Weakness of Arms/Hands: Both  Permission Sought/Granted                  Emotional Assessment Appearance:: Appears stated age     Orientation: : Oriented to Self, Oriented to Place, Oriented to  Time Alcohol / Substance Use: Not Applicable Psych Involvement: No (comment)  Admission diagnosis:  SOB (shortness of breath) [R06.02] Cellulitis of left leg [L03.116] Cellulitis of left lower extremity [L03.116] Generalized weakness [R53.1] Acute renal failure superimposed on chronic kidney disease, unspecified CKD stage, unspecified acute renal failure type ([place]) [N17.9, N18.9] Patient Active Problem List   Diagnosis Date Noted   Cellulitis of left lower extremity 02/14/2021   COPD (chronic obstructive pulmonary disease) ([place])    Hypothyroidism  Stroke [facility])    Acute renal failure superimposed on stage 3a chronic kidney disease (HCC)    Elevated troponin    Normocytic anemia    Generalized weakness    Acute renal failure superimposed on chronic kidney disease ([place])    Atrial fibrillation ([place]) 04/09/2019   Essential  hypertension 04/09/2019   Venous stasis dermatitis of both lower extremities 04/09/2019   Lower limb ulcer, ankle, right, limited to breakdown of skin ([place]) 04/09/2019   Rhabdomyolysis 12/10/2018   Sepsis ([place]) 07/28/2018   Cough 03/20/2015   Bronchitis, chronic obstructive w acute bronchitis ([place]) 92/44/6286   Umbilical hernia without obstruction and without gangrene 01/12/2015   Ventral hernia without obstruction or gangrene 01/02/2015   PCP:  [name], MD Pharm[facility]   [id number] [place] [address] [place] [address] Phone: [phone] Fax: [phone]     Social Determinants of Health (SDOH) Interventions    Readmission Risk Interventions No flowsheet data found.

## 2021-02-15 NOTE — Plan of Care (Signed)
  Problem: Health Behavior/Discharge Planning: Goal: Ability to manage health-related needs will improve Outcome: Progressing   Problem: Clinical Measurements: Goal: Will remain free from infection Outcome: Progressing   Problem: Pain Managment: Goal: General experience of comfort will improve Outcome: Progressing   

## 2021-02-15 NOTE — NC FL2 (Signed)
[place] LEVEL OF CARE SCREENING TOOL     IDENTIFICATION  Patient Name: Daniel Eaton. Birthdate: 1940-06-02 Sex: male Admission Date (Current Location): 02/14/2021  [facility] and [place] Number:  Engineering geologist and Address:         Provider Number: [id number]  Attending Physician Name and Address:  Fritzi Mandes, MD  Relative Name and Phone Number:       Current Level of Care: Hospital Recommended Level of Care: [name] Prior Approval Number:    Date Approved/Denied:   PASRR Number: [id number]  Discharge Plan: SNF    Current Diagnoses: Patient Active Problem List   Diagnosis Date Noted   Cellulitis of left lower extremity 02/14/2021   COPD (chronic obstructive pulmonary disease) ([place])    Hypothyroidism    Stroke ([place])    Acute renal failure superimposed on stage 3a chronic kidney disease (HCC)    Elevated troponin    Normocytic anemia    Generalized weakness    Acute renal failure superimposed on chronic kidney disease ([place])    Atrial fibrillation ([place]) 04/09/2019   Essential hypertension 04/09/2019   Venous stasis dermatitis of both lower extremities 04/09/2019   Lower limb ulcer, ankle, right, limited to breakdown of skin ([place]) 04/09/2019   Rhabdomyolysis 12/10/2018   Sepsis ([place]) 07/28/2018   Cough 03/20/2015   Bronchitis, chronic obstructive w acute bronchitis ([place]) 17/49/4496   Umbilical hernia without obstruction and without gangrene 01/12/2015   Ventral hernia without obstruction or gangrene 01/02/2015    Orientation RESPIRATION BLADDER Height & Weight     Self, Time, Situation, Place  Normal Incontinent, External catheter Weight: 124.7 kg Height:  5\' 11"  (180.3 cm)  BEHAVIORAL SYMPTOMS/MOOD NEUROLOGICAL BOWEL NUTRITION STATUS      Continent Diet (regular)  AMBULATORY STATUS COMMUNICATION OF NEEDS Skin   Extensive Assist Verbally Normal                       Personal Care Assistance Level of  Assistance              Functional Limitations Info             SPECIAL CARE FACTORS FREQUENCY  PT (By licensed PT), OT (By licensed OT)                    Contractures Contractures Info: Not present    Additional Factors Info  Code Status, Allergies Code Status Info: full Allergies Info: Diltiazem HCL           Current Medications (02/15/2021):  This is the current hospital active medication list Current Facility-Administered Medications  Medication Dose Route Frequency Provider Last Rate Last Admin   acetaminophen (TYLENOL) tablet 650 mg  650 mg Oral Q6H PRN Ivor Costa, MD   650 mg at 02/15/21 1205   albuterol (VENTOLIN HFA) 108 (90 Base) MCG/ACT inhaler 2 puff  2 puff Inhalation Q2H PRN Duffy Bruce, MD       amiodarone (PACERONE) tablet 200 mg  200 mg Oral Daily Ivor Costa, MD   200 mg at 02/15/21 1023   cefTRIAXone (ROCEPHIN) 2 g in sodium chloride 0.9 % 100 mL IVPB  2 g Intravenous Q24H Ivor Costa, MD 200 mL/hr at 02/15/21 1031 2 g at 02/15/21 1031   dextromethorphan-guaiFENesin (MUCINEX DM) 30-600 MG per 12 hr tablet 1 tablet  1 tablet Oral BID PRN Ivor Costa, MD   1 tablet at 02/14/21 2101  fluticasone furoate-vilanterol (BREO ELLIPTA) 100-25 MCG/ACT 1 puff  1 puff Inhalation Daily Ivor Costa, MD   1 puff at 02/15/21 0900   And   umeclidinium bromide (INCRUSE ELLIPTA) 62.5 MCG/ACT 1 puff  1 puff Inhalation Daily Ivor Costa, MD   1 puff at 02/15/21 0901   hydrALAZINE (APRESOLINE) injection 5 mg  5 mg Intravenous Q2H PRN Ivor Costa, MD       levothyroxine (SYNTHROID) tablet 88 mcg  88 mcg Oral Daily Ivor Costa, MD   88 mcg at 02/15/21 0456   loratadine (CLARITIN) tablet 10 mg  10 mg Oral Daily Ivor Costa, MD   10 mg at 02/15/21 1022   methocarbamol (ROBAXIN) tablet 500 mg  500 mg Oral Q8H PRN Ivor Costa, MD   500 mg at 02/14/21 2100   [START ON 02/16/2021] metoprolol succinate (TOPROL-XL) 24 hr tablet 50 mg  50 mg Oral Daily Dallie Piles, RPH        multivitamin with minerals tablet 1 tablet  1 tablet Oral Daily Ivor Costa, MD   1 tablet at 02/15/21 1024   omega-3 acid ethyl esters (LOVAZA) capsule 1 g  1 g Oral Daily Ivor Costa, MD   1 g at 02/15/21 1023   ondansetron (ZOFRAN) injection 4 mg  4 mg Intravenous Q8H PRN Ivor Costa, MD   4 mg at 02/14/21 1045   pantoprazole (PROTONIX) EC tablet 40 mg  40 mg Oral Daily Ivor Costa, MD   40 mg at 02/15/21 1023   polyethylene glycol (MIRALAX / GLYCOLAX) packet 17 g  17 g Oral Daily PRN Ivor Costa, MD       Rivaroxaban Alveda Reasons) tablet 15 mg  15 mg Oral Daily Ivor Costa, MD   15 mg at 02/15/21 1023   senna-docusate (Senokot-S) tablet 1 tablet  1 tablet Oral Daily PRN Ivor Costa, MD       silver sulfADIAZINE (SILVADENE) 1 % cream   Topical BID Fritzi Mandes, MD   Given at 02/15/21 1212     Discharge Medications: Please see discharge summary for a list of discharge medications.  Relevant Imaging Results:  Relevant Lab Results:   Additional Information [id number]  [name], RN

## 2021-02-15 NOTE — Consult Note (Addendum)
WOC Nurse Consult Note: Patient receiving care in [facility]. Consult completed after review of record, including images of wound. Reason for Consult: wound to LLE Wound type: trauma injury from bumping leg Pressure Injury POA: Yes/No/NA Measurement: Wound bed: see photos Drainage (amount, consistency, odor)  Periwound: changes consistent with venous stasis Dressing procedure/placement/frequency: Wash wound to LLE with soap and water, pat dry. Apply silvadene to wound, then Telfa non-adherent pad. Perform bid. Monitor the wound area(s) for worsening of condition such as: Signs/symptoms of infection,  Increase in size,  Development of or worsening of odor, Development of pain, or increased pain at the affected locations.  Notify the medical team if any of these develop.  Thank you for the consult. [place] nurse will not follow at this time.  Please re-consult the [place] team if needed.  Val Riles, RN, MSN, CWOCN, CNS-BC, pager [phone]

## 2021-02-15 NOTE — Evaluation (Signed)
Physical Therapy Evaluation Patient Details Name: Daniel Eaton. MRN: [id number] DOB: 1941-02-10 Today's Date: 02/15/2021  History of Present Illness  Patient presents to ED for LLE pain secondary to skin tear on christmas eve. Patient has PMH of HTN, HLD, COPD, stroke, GERD, hypothyroidism, CKD stage IIa, sCHF, a fib on Xarelto, OSA. Patient sees outpatient PT and has a home health aide.   Clinical Impression  Patient is a 81 year old male who presents with generalized weakness and limited mobility. Patient requires a co-treat with OT due to limited mobility and safety.   Prior to hospital admission, pt was living at home with his wife and dog, receiving home health aide 2x/week for bathing and cleaning as well as going to outpatient PT.  Currently pt demonstrates loss of strength, limited mobility, and high fall risk. Patient is in bed upon PT and OT arrival and agreeable to evaluation. Patient requires assistance to sit EOB due to limited ability to scoot upon transitioning to sitting. Upon sitting he is able to tolerate LE strength testing with the exception of his distal LLE due to wound. His UE are limited in ROM at baseline. He is able to perform a STS x2 person assist with max A to RW x2 trials with second trial additional ability to perform side stepping at edge of bed. Patient is unsafe for ambulation at this time. Education on need for SNF rather than home placement due to high fall risk and risk for re-admission performed. Patient reports he has been to [facility] and [facility] and would not like to return to either of those locations, information passed on to case management.  Pt would benefit from skilled PT to address noted impairments and functional limitations (see below for any additional details).  Upon hospital discharge, pt would benefit from SNF placement due to high risk of falling, risk for re-admission, and overall limited mobility.      Recommendations for follow up  therapy are one component of a multi-disciplinary discharge planning process, led by the attending physician.  Recommendations may be updated based on patient status, additional functional criteria and insurance authorization.  Follow Up Recommendations Skilled nursing-short term rehab (<3 hours/day)    Assistance Recommended at Discharge Intermittent Supervision/Assistance  Functional Status Assessment Patient has had a recent decline in their functional status and demonstrates the ability to make significant improvements in function in a reasonable and predictable amount of time.  Equipment Recommendations  None recommended by PT    Recommendations for Other Services       Precautions / Restrictions Precautions Precautions: Fall Precaution Comments: has LLE in gauze from wound Restrictions Weight Bearing Restrictions: No      Mobility  Bed Mobility Overal bed mobility: Needs Assistance Bed Mobility: Supine to Sit;Sit to Supine     Supine to sit: Min assist;Mod assist Sit to supine: Min assist   General bed mobility comments: additional time required, needs UE support for transitioning. pulls on PT and OT hands    Transfers Overall transfer level: Needs assistance Equipment used: Rolling walker (2 wheels) Transfers: Sit to/from Stand Sit to Stand: Max assist;+2 physical assistance;+2 safety/equipment           General transfer comment: Patient requries max A x 2 person assist from bed to RW    Ambulation/Gait               General Gait Details: side stepped 4 steps with BRW alongside bed, unsafe for ambulation away from  bed at this time.  Stairs            Wheelchair Mobility    Modified Rankin (Stroke Patients Only)       Balance Overall balance assessment: Needs assistance Sitting-balance support: Feet supported Sitting balance-Leahy Scale: Fair Sitting balance - Comments: patient requires BLE on floor for sitting EOB   Standing balance  support: Bilateral upper extremity supported;During functional activity Standing balance-Leahy Scale: Poor Standing balance comment: able to stand with BUE support, requires heavy reliance upon UE's in standing for side step and positioning. able to release UE for static stand for ~ 3 seconds                             Pertinent Vitals/Pain Pain Assessment: Faces Faces Pain Scale: Hurts a little bit Pain Location: LLE Pain Intervention(s): Monitored during session;Repositioned    Home Living Family/patient expects to be discharged to:: Private residence Living Arrangements: Spouse/significant other Available Help at Discharge: Family;Home health ([facility] aide 2x/week) Type of Home: House Home Access: Ramped entrance       Home Layout: One level Home Equipment: Conservation officer, nature (2 wheels);BSC/3in1;Hand held shower head;Grab bars - tub/shower;Grab bars - toilet Additional Comments: Patient uses bRW at baseline. Sleeps in recliner-has tried a hospital bed unsuccessfully.    Prior Function Prior Level of Function : Needs assist       Physical Assist : ADLs (physical)   ADLs (physical): Bathing Mobility Comments: Patient ambulates short distances with bRW at baseline, is going to outpatient PT ADLs Comments: Patient has an aide come 2x/week for assistance with bathing and cleaning.     Hand Dominance        Extremity/Trunk Assessment   Upper Extremity Assessment Upper Extremity Assessment: Defer to OT evaluation    Lower Extremity Assessment Lower Extremity Assessment: Generalized weakness;RLE deficits/detail;LLE deficits/detail RLE Deficits / Details: grossly 3+/5 RLE Sensation: decreased light touch RLE Coordination: decreased gross motor LLE Deficits / Details: hip gross 3+/5; LE limited by wound/pain LLE: Unable to fully assess due to pain LLE Sensation: decreased light touch LLE Coordination: decreased gross motor       Communication   Communication:  HOH  Cognition Arousal/Alertness: Awake/alert Behavior During Therapy: WFL for tasks assessed/performed Overall Cognitive Status: Within Functional Limits for tasks assessed                                 General Comments: Patient is alert and oriented x 4, very frustrated with limited mobility, frustrated about LLE wound, has multiple questions for doctor.        General Comments General comments (skin integrity, edema, etc.): distal aspect of LLE wrapped in gauze.    Exercises Other Exercises Other Exercises: Patient educated on role of PT and OT co-treat in acute care setting, safe sit to stand transfers, side stepping, and bed mobility.   Assessment/Plan    PT Assessment Patient needs continued PT services  PT Problem List Decreased strength;Decreased range of motion;Decreased activity tolerance;Decreased balance;Decreased knowledge of use of DME;Decreased cognition;Decreased coordination;Decreased mobility;Decreased safety awareness;Obesity       PT Treatment Interventions DME instruction;Gait training;Stair training;Functional mobility training;Therapeutic activities;Patient/family education;Neuromuscular re-education;Balance training;Therapeutic exercise;Manual techniques    PT Goals (Current goals can be found in the Care Plan section)  Acute Rehab PT Goals Patient Stated Goal: to move better, wants to go home PT Goal Formulation:  With patient Time For Goal Achievement: 03/01/21 Potential to Achieve Goals: Fair    Frequency Min 2X/week   Barriers to discharge Decreased caregiver support Patient unsafe for discharge home    Co-evaluation PT/OT/SLP Co-Evaluation/Treatment: Yes Reason for Co-Treatment: Complexity of the patient's impairments (multi-system involvement);For patient/therapist safety;To address functional/ADL transfers PT goals addressed during session: Mobility/safety with mobility;Balance;Proper use of DME;Strengthening/ROM OT goals  addressed during session: ADL's and self-care;Proper use of Adaptive equipment and DME;Strengthening/ROM       AM-PAC PT "6 Clicks" Mobility  Outcome Measure Help needed turning from your back to your side while in a flat bed without using bedrails?: A Lot Help needed moving from lying on your back to sitting on the side of a flat bed without using bedrails?: Total Help needed moving to and from a bed to a chair (including a wheelchair)?: A Lot Help needed standing up from a chair using your arms (e.g., wheelchair or bedside chair)?: A Lot Help needed to walk in hospital room?: A Lot Help needed climbing 3-5 steps with a railing? : Total 6 Click Score: 10    End of Session Equipment Utilized During Treatment: Gait belt Activity Tolerance: Patient limited by fatigue Patient left: in bed;with call bell/phone within reach;with bed alarm set Nurse Communication: Mobility status PT Visit Diagnosis: Unsteadiness on feet (R26.81);Other abnormalities of gait and mobility (R26.89);Muscle weakness (generalized) (M62.81);Difficulty in walking, not elsewhere classified (R26.2)    Time: 4401-0272 PT Time Calculation (min) (ACUTE ONLY): 47 min   Charges:   PT Evaluation $PT Eval Moderate Complexity: 1 Mod PT Treatments $Therapeutic Activity: 8-22 mins       Janna Arch, PT, DPT  02/15/2021, 10:39 AM

## 2021-02-15 NOTE — Progress Notes (Addendum)
Patient refused dressing on small right upper arm skin tear/caused by patient.

## 2021-02-15 NOTE — Progress Notes (Signed)
Pt refused cpap until he can his his nasal pillows headgear brought from home on 1/2. Cpap machine left at bedside.

## 2021-02-16 DIAGNOSIS — J449 Chronic obstructive pulmonary disease, unspecified: Secondary | ICD-10-CM

## 2021-02-16 DIAGNOSIS — I4891 Unspecified atrial fibrillation: Secondary | ICD-10-CM

## 2021-02-16 MED ORDER — DIPHENHYDRAMINE HCL 25 MG PO CAPS
25.0000 mg | ORAL_CAPSULE | Freq: Once | ORAL | Status: AC
  Administered 2021-02-16: 25 mg via ORAL
  Filled 2021-02-16: qty 1

## 2021-02-16 MED ORDER — CEPHALEXIN 500 MG PO CAPS
500.0000 mg | ORAL_CAPSULE | Freq: Four times a day (QID) | ORAL | Status: DC
  Administered 2021-02-16: 500 mg via ORAL
  Filled 2021-02-16: qty 1

## 2021-02-16 MED ORDER — SODIUM CHLORIDE 0.9 % IV SOLN
1.0000 g | INTRAVENOUS | Status: DC
  Administered 2021-02-16: 1 g via INTRAVENOUS
  Filled 2021-02-16: qty 1
  Filled 2021-02-16: qty 10

## 2021-02-16 MED ORDER — SODIUM CHLORIDE 0.9 % IV SOLN
1.0000 g | INTRAVENOUS | Status: DC
  Filled 2021-02-16: qty 10

## 2021-02-16 NOTE — TOC Progression Note (Signed)
Transition of Care (TOC) - Progression Note    Patient Details  Name: Daniel Eaton. MRN: [id number] Date of Birth: [birth year]  [facility]) CM/SW Contact  [name], RN Phone Number: 02/16/2021, 9:13 AM  Clinical Narrative:    Bed offers presented to patient. He accepts bed at [facility].  Accepted in bed and notified [name] at [facility].  She states she will let me know be availability after the 10 am meeting.   Per MD medically ready for discharge Spoke with [name] at [facility].  She is to start auth for SNF and EMS   Expected Discharge Plan: [place] Barriers to Discharge: Continued Medical Work up  Expected Discharge Plan and Services Expected Discharge Plan: [place]   Discharge Planning Services: CM Consult   Living arrangements for the past 2 months: Single Family Home                                       Social Determinants of Health (SDOH) Interventions    Readmission Risk Interventions No flowsheet data found.

## 2021-02-16 NOTE — TOC Progression Note (Signed)
Transition of Care (TOC) - Progression Note    Patient Details  Name: Daniel Eaton. MRN: [id number] Date of Birth: 1940-05-14  Transition of Care [facility]) CM/SW Contact  [name], RN Phone Number: 02/16/2021, 12:05 PM  Clinical Narrative:    Bed offers presented Patient accepts [facility], as [facility] does not currently have bed availability   Accepted in [place] at [employer] with [name] at [facility] to start auth   Expected Discharge Plan: [place] Barriers to Discharge: Continued Medical Work up  Expected Discharge Plan and Services Expected Discharge Plan: [place]   Discharge Planning Services: CM Consult   Living arrangements for the past 2 months: Single Family Home                                       Social Determinants of Health (SDOH) Interventions    Readmission Risk Interventions No flowsheet data found.

## 2021-02-16 NOTE — Progress Notes (Signed)
[facility] [place] NAME: Daniel Eaton    MR#:  [id number]  DATE OF BIRTH:  09/10/1940  SUBJECTIVE:   came in after patient developed a skin tear on his left lower extremity after bumping into his bathtub. Had some bleeding. No fever. Seen by physical therapy recommends rehab No family at bedside  Overall slept well. Discussed with me his Goals of rehab REVIEW OF SYSTEMS:   Review of Systems  Constitutional:  Negative for chills, fever and weight loss.  HENT:  Negative for ear discharge, ear pain and nosebleeds.   Eyes:  Negative for blurred vision, pain and discharge.  Respiratory:  Negative for sputum production, shortness of breath, wheezing and stridor.   Cardiovascular:  Negative for chest pain, palpitations, orthopnea and PND.  Gastrointestinal:  Negative for abdominal pain, diarrhea, nausea and vomiting.  Genitourinary:  Negative for frequency and urgency.  Musculoskeletal:  Positive for joint pain. Negative for back pain.  Neurological:  Positive for weakness. Negative for sensory change, speech change and focal weakness.  Psychiatric/Behavioral:  Negative for depression and hallucinations. The patient is not nervous/anxious.   Tolerating Diet:yes Tolerating PT: Rehab  DRUG ALLERGIES:   Allergies  Allergen Reactions   Diltiazem Hcl Itching and Other (See Comments)    edema    VITALS:  Blood pressure (!) 122/57, pulse 76, temperature 98.6 F (37 C), resp. rate 20, height 5\' 11"  (1.803 m), weight 124.7 kg, SpO2 97 %.  PHYSICAL EXAMINATION:   Physical Exam  GENERAL:  81 y.o.-year-old patient lying in the bed with no acute distress.  HEENT: Head atraumatic, normocephalic. Oropharynx and nasopharynx clear.  LUNGS: Normal breath sounds bilaterally, no wheezing, rales, rhonchi. No use of accessory muscles of respiration.  CARDIOVASCULAR: S1, S2 normal. No murmurs, rubs, or gallops.  ABDOMEN: Soft, nontender, nondistended.  Bowel sounds present. No organomegaly or mass.  EXTREMITIES:   NEUROLOGIC: nonfocal PSYCHIATRIC:  patient is alert and oriented x 3.  SKIN: as above  LABORATORY PANEL:  CBC Recent Labs  Lab 02/15/21 0432  WBC 7.3  HGB 8.4*  HCT 29.2*  PLT 319     Chemistries  Recent Labs  Lab 02/14/21 0615 02/15/21 0432  NA 131* 134*  K 4.2 3.9  CL 96* 98  CO2 23 25  GLUCOSE 116* 82  BUN 45* 44*  CREATININE 1.99* 1.94*  CALCIUM 9.5 8.8*  AST 22  --   ALT 19  --   ALKPHOS 51  --   BILITOT 1.0  --     Cardiac Enzymes No results for input(s): TROPONINI in the last 168 hours. RADIOLOGY:  US Venous Img Lower Unilateral Left (DVT)  Result Date: 02/14/2021 CLINICAL DATA:  Cellulitis, edema x1 week EXAM: LEFT LOWER EXTREMITY VENOUS DOPPLER ULTRASOUND TECHNIQUE: Gray-scale sonography with graded compression, as well as color Doppler and duplex ultrasound were performed to evaluate the lower extremity deep venous systems from the level of the common femoral vein and including the common femoral, femoral, profunda femoral, popliteal and calf veins including the posterior tibial, peroneal and gastrocnemius veins when visible. The superficial great saphenous vein was also interrogated. Spectral Doppler was utilized to evaluate flow at rest and with distal augmentation maneuvers in the common femoral, femoral and popliteal veins. COMPARISON:  None. FINDINGS: Contralateral Common Femoral Vein: Respiratory phasicity is normal and symmetric with the symptomatic side. No evidence of thrombus. Normal compressibility. Common Femoral Vein: No evidence of thrombus. Normal compressibility, respiratory phasicity  and response to augmentation. Saphenofemoral Junction: No evidence of thrombus. Normal compressibility and flow on color Doppler imaging. Profunda Femoral Vein: No evidence of thrombus. Normal compressibility and flow on color Doppler imaging. Femoral Vein: No evidence of thrombus. Normal compressibility,  respiratory phasicity and response to augmentation. Popliteal Vein: No evidence of thrombus. Normal compressibility, respiratory phasicity and response to augmentation. Calf Veins: Unable to be evaluated due to wound/dressings. Other Findings:  None. IMPRESSION: No evidence of deep venous thrombosis above the level of the knee. The calf veins were unable to be evaluated due to wound/dressings. Electronically Signed   By: Albin Felling M.D.   On: 02/14/2021 12:04   ASSESSMENT AND PLAN:  Daniel Eaton. is a 81 y.o. male with medical history significant of hypertension, hyperlipidemia, COPD, stroke, GERD, hypothyroidism, CKD stage IIIa, sCHF, atrial fibrillation on Xarelto, OSA, who presents with left lower leg pain.  Cellulitis of left lower extremity--mild Skin tear following trauma Bilateral Chronic venous stasis changes --: Patient does not have fever or leukocytosis.  Does not meet criteria for sepsis, but his lactic acid is 2.3 --Empiric antimicrobial treatment with Rocephin-- change to po in am - Blood cultures x 2 --neg - wound care consult--appreciate input   Atrial fibrillation (HCC) -Metoprolol, Xarelto, amiodarone   Essential hypertension -IV hydralazine as needed -Metoprolol   COPD (chronic obstructive pulmonary disease) ([place]) -Bronchodilators   Hypothyroidism -Synthroid   Stroke [facility]) -Patient is on Xarelto for A. Fib   Acute renal failure superimposed on stage 3a chronic kidney disease ([place]): Baseline creatinine 1.21--1.8 on  -- His creatinine is 1.99, BUN 45.  Likely due to dehydration and continuation of diuretics. -Hold torsemide, spironolactone, metolazone -received IVF     Generalized weakness: Most likely multifactorial etiology. -PT/OT-- recommends rehab --TOC for d/c planning Medically best at baseline for d/c        DVT ppx: on Xarelto Code Status: Full code Family Communication: none today Disposition Plan:  REHAB Consults called:   none Admission status and Level of care: Telemetry Medical:        Remains inpatient appropriate because: awaiting insurance auth for rehab        TOTAL TIME TAKING CARE OF THIS PATIENT: 25 minutes.  >50% time spent on counselling and coordination of care  Note: This dictation was prepared with Dragon dictation along with smaller phrase technology. Any transcriptional errors that result from this process are unintentional.  Fritzi Mandes M.D    [facility]   CC: Primary care physician; [name], MD Patient ID: Daniel Nan., male   DOB: 1940-06-14, 81 y.o.   MRN: [id number]

## 2021-02-17 LAB — CBC
HCT: 29.2 % — ABNORMAL LOW (ref 39.0–52.0)
Hemoglobin: 8.1 g/dL — ABNORMAL LOW (ref 13.0–17.0)
MCH: 23.5 pg — ABNORMAL LOW (ref 26.0–34.0)
MCHC: 27.7 g/dL — ABNORMAL LOW (ref 30.0–36.0)
MCV: 84.9 fL (ref 80.0–100.0)
Platelets: 298 10*3/uL (ref 150–400)
RBC: 3.44 MIL/uL — ABNORMAL LOW (ref 4.22–5.81)
RDW: 21.4 % — ABNORMAL HIGH (ref 11.5–15.5)
WBC: 7.9 10*3/uL (ref 4.0–10.5)
nRBC: 0.5 % — ABNORMAL HIGH (ref 0.0–0.2)

## 2021-02-17 LAB — BASIC METABOLIC PANEL
Anion gap: 6 (ref 5–15)
BUN: 20 mg/dL (ref 8–23)
CO2: 26 mmol/L (ref 22–32)
Calcium: 8.7 mg/dL — ABNORMAL LOW (ref 8.9–10.3)
Chloride: 101 mmol/L (ref 98–111)
Creatinine, Ser: 1.22 mg/dL (ref 0.61–1.24)
GFR, Estimated: 60 mL/min — ABNORMAL LOW (ref >60)
Glucose, Bld: 119 mg/dL — ABNORMAL HIGH (ref 70–99)
Potassium: 3.6 mmol/L (ref 3.5–5.1)
Sodium: 133 mmol/L — ABNORMAL LOW (ref 135–145)

## 2021-02-17 LAB — RESP PANEL BY RT-PCR (FLU A&B, COVID) ARPGX2
Influenza A by PCR: NEGATIVE
Influenza B by PCR: NEGATIVE
SARS Coronavirus 2 by RT PCR: NEGATIVE

## 2021-02-17 LAB — MAGNESIUM: Magnesium: 2.3 mg/dL (ref 1.7–2.4)

## 2021-02-17 MED ORDER — LACTATED RINGERS IV SOLN: INTRAVENOUS | Status: DC

## 2021-02-17 MED ORDER — CEPHALEXIN 500 MG PO CAPS: 500.0000 mg | ORAL_CAPSULE | Freq: Four times a day (QID) | ORAL | 0 refills | Status: AC

## 2021-02-17 MED ORDER — METOLAZONE 2.5 MG PO TABS: ORAL_TABLET | ORAL | Status: DC

## 2021-02-17 MED ORDER — POTASSIUM CHLORIDE ER 10 MEQ PO CPCR: ORAL_CAPSULE | ORAL | Status: DC

## 2021-02-17 MED ORDER — TORSEMIDE 20 MG PO TABS: ORAL_TABLET | ORAL | 0 refills | Status: DC

## 2021-02-17 MED ORDER — ALBUTEROL SULFATE HFA 108 (90 BASE) MCG/ACT IN AERS: 2.0000 | INHALATION_SPRAY | Freq: Four times a day (QID) | RESPIRATORY_TRACT | Status: DC | PRN

## 2021-02-17 MED ORDER — SODIUM CHLORIDE 0.9 % IV SOLN
1.0000 g | INTRAVENOUS | Status: DC
  Administered 2021-02-17: 1 g via INTRAVENOUS
  Filled 2021-02-17: qty 1
  Filled 2021-02-17: qty 10

## 2021-02-17 MED ORDER — SPIRONOLACTONE 50 MG PO TABS: ORAL_TABLET | ORAL | Status: DC

## 2021-02-17 NOTE — Care Management Important Message (Signed)
Important Message  Patient Details  Name: Daniel Eaton. MRN: [id number] Date of Birth: 15-Apr-1940   Medicare Important Message Given:  Yes     [name] 02/17/2021, 10:19 AM

## 2021-02-17 NOTE — Progress Notes (Signed)
Physical Therapy Treatment Patient Details Name: Daniel Eaton. MRN: [id number] DOB: 1940/04/20 Today's Date: 02/17/2021   History of Present Illness Daniel Eaton comes to [facility] ED on 1/1 for progressive weakness and LLE pain secondary to skin tear sustained on 12/24. Patient has PMH of HTN, HLD, COPD, stroke, GERD, hypothyroidism, CKD2, sCHF, a fib on Xarelto, OSA. Patient sees outpatient PT and has a home health aide.    PT Comments    Pt in bed upon entry, on bed pain, trying to have BM, unsuccessful, agreeable to attempt OOB to [facility] goal of session. Pt requires totalA+2 to EOB where he is steady and balanced. Pt unable to rise with maxA from EOB, but with elevated surface and modA, pt able to rise to standing several times. Pt establishes balance over 5-10 seconds, then is able to make a slow, steady, safe step pivot transition BSC to/from EOB. Pt able to sit at EOB for ~15 minutes this date good trunk control, but comfort becomes an issue. Pt is not successful in moving bowels. Pt remains far off baseline for independence and mobility, would not have adequate assist as home right now, would still benefit from STR stay prior to eventual return to home.     Recommendations for follow up therapy are one component of a multi-disciplinary discharge planning process, led by the attending physician.  Recommendations may be updated based on patient status, additional functional criteria and insurance authorization.  Follow Up Recommendations  Skilled nursing-short term rehab (<3 hours/day)     Assistance Recommended at Discharge Intermittent Supervision/Assistance  Patient can return home with the following     Equipment Recommendations  None recommended by PT    Recommendations for Other Services       Precautions / Restrictions Precautions Precautions: Fall     Mobility  Bed Mobility Overal bed mobility: Needs Assistance Bed Mobility: Supine to Sit;Sit to Supine      Supine to sit: Max assist;+2 for physical assistance Sit to supine: Max assist;+2 for physical assistance        Transfers Overall transfer level: Needs assistance Equipment used: Rolling walker (2 wheels) (BRW) Transfers: Sit to/from Stand;Bed to chair/wheelchair/BSC Sit to Stand: Mod assist;From elevated surface Stand pivot transfers: Min guard              Ambulation/Gait                   Stairs             Wheelchair Mobility    Modified Rankin (Stroke Patients Only)       Balance Overall balance assessment: Needs assistance                                          Cognition Arousal/Alertness: Awake/alert Behavior During Therapy: WFL for tasks assessed/performed Overall Cognitive Status: Within Functional Limits for tasks assessed                                          Exercises      General Comments        Pertinent Vitals/Pain Faces Pain Scale: Hurts whole lot Pain Location: Severe pain on buttocks when performing supin eto/from sitting Pain Intervention(s): Monitored during session;Repositioned    Home Living  Prior Function            PT Goals (current goals can now be found in the care plan section) Acute Rehab PT Goals Patient Stated Goal: to move better, wants to go home PT Goal Formulation: With patient Time For Goal Achievement: 03/01/21 Potential to Achieve Goals: Fair Progress towards PT goals: Not progressing toward goals - comment    Frequency    Min 2X/week      PT Plan Current plan remains appropriate    Co-evaluation              AM-PAC PT "6 Clicks" Mobility   Outcome Measure  Help needed turning from your back to your side while in a flat bed without using bedrails?: Total Help needed moving from lying on your back to sitting on the side of a flat bed without using bedrails?: Total Help needed moving to and from a bed  to a chair (including a wheelchair)?: Total Help needed standing up from a chair using your arms (e.g., wheelchair or bedside chair)?: Total Help needed to walk in hospital room?: Total Help needed climbing 3-5 steps with a railing? : Total 6 Click Score: 6    End of Session Equipment Utilized During Treatment: Gait belt Activity Tolerance: Patient limited by fatigue Patient left: in bed;with call bell/phone within reach;with bed alarm set Nurse Communication: Mobility status PT Visit Diagnosis: Unsteadiness on feet (R26.81);Other abnormalities of gait and mobility (R26.89);Muscle weakness (generalized) (M62.81);Difficulty in walking, not elsewhere classified (R26.2)     Time: 6967-8938 PT Time Calculation (min) (ACUTE ONLY): 54 min  Charges:  $Therapeutic Activity: 53-67 mins                    4:24 PM, 02/17/21 Etta Grandchild, PT, DPT Physical Therapist - [facility]  [phone] ([place])     Fenwick C 02/17/2021, 4:21 PM

## 2021-02-17 NOTE — Plan of Care (Signed)
°  Problem: Clinical Measurements: Goal: Will remain free from infection Outcome: Progressing   Problem: Pain Managment: Goal: General experience of comfort will improve Outcome: Progressing   Pt is involved in and agrees with the plan of care. V/S stable. Tylenol given for pain on his knees & legs. Denies SOB.

## 2021-02-17 NOTE — TOC Progression Note (Signed)
Transition of Care (TOC) - Progression Note    Patient Details  Name: Daniel Eaton. MRN: [id number] Date of Birth: November 27, 1940  [facility]) CM/SW Contact  [name], RN Phone Number: 02/17/2021, 8:42 AM  Clinical Narrative:    Insurance auth for SNF pending   Expected Discharge Plan: [place] Barriers to Discharge: Continued Medical Work up  Expected Discharge Plan and Services Expected Discharge Plan: [place]   Discharge Planning Services: CM Consult   Living arrangements for the past 2 months: Single Family Home                                       Social Determinants of Health (SDOH) Interventions    Readmission Risk Interventions No flowsheet data found.

## 2021-02-17 NOTE — TOC Progression Note (Signed)
Transition of Care (TOC) - Progression Note    Patient Details  Name: Ranald Alessio. MRN: [id number] Date of Birth: 17-Sep-1940  Transition of Care [facility]) CM/SW Contact  [name], RN Phone Number: 02/17/2021, 9:51 AM  Clinical Narrative:     Messaged received from Tammy at [facility] has been approved for [employer]  Good for 7 days auth # [id number].   [facility]m Kaufmann #[id number]  Expected Discharge Plan: [place] Barriers to Discharge: Continued Medical Work up  Expected Discharge Plan and Services Expected Discharge Plan: [place]   Discharge Planning Services: CM Consult   Living arrangements for the past 2 months: Single Family Home                                       Social Determinants of Health (SDOH) Interventions    Readmission Risk Interventions No flowsheet data found.

## 2021-02-17 NOTE — Progress Notes (Signed)
[facility] [place] NAME: Reinhold Rickey    MR#:  [id number]  DATE OF BIRTH:  [birth year]  SUBJECTIVE:   Pt said he needed another day before going to SNF rehab.   DRUG ALLERGIES:   Allergies  Allergen Reactions   Diltiazem Hcl Itching and Other (See Comments)    edema    VITALS:  Blood pressure (!) 142/70, pulse 94, temperature 98.4 F (36.9 C), temperature source Oral, resp. rate 18, height 5\' 11"  (1.803 m), weight 124.7 kg, SpO2 98 %.  PHYSICAL EXAMINATION:   Physical Exam  Constitutional: NAD, AAOx3 HEENT: conjunctivae and lids normal, EOMI CV: No cyanosis.   RESP: normal respiratory effort, on RA Extremities: Left leg wrapped.  Right leg swollen. SKIN: warm, dry, extensive bruising  Neuro: II - XII grossly intact.   Psych: depressed mood and affect.     LABORATORY PANEL:  CBC Recent Labs  Lab 02/17/21 1056  WBC 7.9  HGB 8.1*  HCT 29.2*  PLT 298     Chemistries  Recent Labs  Lab 02/14/21 0615 02/15/21 0432 02/17/21 1056  NA 131*   < > 133*  K 4.2   < > 3.6  CL 96*   < > 101  CO2 23   < > 26  GLUCOSE 116*   < > 119*  BUN 45*   < > 20  CREATININE 1.99*   < > 1.22  CALCIUM 9.5   < > 8.7*  MG  --   --  2.3  AST 22  --   --   ALT 19  --   --   ALKPHOS 51  --   --   BILITOT 1.0  --   --    < > = values in this interval not displayed.    Cardiac Enzymes No results for input(s): TROPONINI in the last 168 hours. RADIOLOGY:  No results found. ASSESSMENT AND PLAN:  Harshal Sirmon. is a 81 y.o. male with medical history significant of hypertension, hyperlipidemia, COPD, stroke, GERD, hypothyroidism, CKD stage IIIa, sCHF, atrial fibrillation on Xarelto, OSA, who presents with left lower leg pain.  Cellulitis of left lower extremity--mild Skin tear following trauma Bilateral Chronic venous stasis changes --: Patient does not have fever or leukocytosis.  Does not meet criteria for sepsis, but his lactic  acid is 2.3 --cont ceftriaxone   Atrial fibrillation (HCC) -cont metop and amiodarone --cont xarelto   Essential hypertension -IV hydralazine as needed --cont metop   COPD (chronic obstructive pulmonary disease) (HCC) --cont home bronchodilator   Hypothyroidism -Synthroid   Acute renal failure superimposed on stage 3a chronic kidney disease ([place]):  Baseline creatinine 1.21.  On presentation, Cr 1.99, BUN 45.  Likely due to dehydration and continuation of diuretics. --hold home torsemide, spironolactone and metolazone    Generalized weakness: Most likely multifactorial etiology. -PT/OT-- recommends rehab    DVT ppx: on Xarelto Code Status: Full code Family Communication: none today Disposition Plan:  REHAB Consults called:  none Admission status and Level of care: Telemetry Medical:       Enzo Bi M.D    [facility]   CC: Primary care physician; [name], MD Patient ID: Talbert Nan., male   DOB: 13-Feb-1941, 81 y.o.   MRN: [id number]

## 2021-02-18 DIAGNOSIS — N179 Acute kidney failure, unspecified: Secondary | ICD-10-CM | POA: Diagnosis not present

## 2021-02-18 DIAGNOSIS — S51011A Laceration without foreign body of right elbow, initial encounter: Secondary | ICD-10-CM | POA: Diagnosis not present

## 2021-02-18 DIAGNOSIS — R051 Acute cough: Secondary | ICD-10-CM | POA: Diagnosis not present

## 2021-02-18 DIAGNOSIS — S8992XA Unspecified injury of left lower leg, initial encounter: Secondary | ICD-10-CM | POA: Diagnosis present

## 2021-02-18 DIAGNOSIS — N183 Chronic kidney disease, stage 3 unspecified: Secondary | ICD-10-CM | POA: Diagnosis not present

## 2021-02-18 DIAGNOSIS — R58 Hemorrhage, not elsewhere classified: Secondary | ICD-10-CM | POA: Diagnosis not present

## 2021-02-18 DIAGNOSIS — I509 Heart failure, unspecified: Secondary | ICD-10-CM | POA: Diagnosis not present

## 2021-02-18 DIAGNOSIS — S41111A Laceration without foreign body of right upper arm, initial encounter: Secondary | ICD-10-CM | POA: Diagnosis not present

## 2021-02-18 DIAGNOSIS — I872 Venous insufficiency (chronic) (peripheral): Secondary | ICD-10-CM | POA: Diagnosis not present

## 2021-02-18 DIAGNOSIS — U071 COVID-19: Secondary | ICD-10-CM | POA: Diagnosis not present

## 2021-02-18 DIAGNOSIS — M79605 Pain in left leg: Secondary | ICD-10-CM | POA: Diagnosis not present

## 2021-02-18 DIAGNOSIS — J449 Chronic obstructive pulmonary disease, unspecified: Secondary | ICD-10-CM | POA: Diagnosis not present

## 2021-02-18 DIAGNOSIS — Z87891 Personal history of nicotine dependence: Secondary | ICD-10-CM | POA: Diagnosis not present

## 2021-02-18 DIAGNOSIS — W1789XA Other fall from one level to another, initial encounter: Secondary | ICD-10-CM | POA: Diagnosis not present

## 2021-02-18 DIAGNOSIS — L03116 Cellulitis of left lower limb: Secondary | ICD-10-CM | POA: Diagnosis not present

## 2021-02-18 DIAGNOSIS — E039 Hypothyroidism, unspecified: Secondary | ICD-10-CM | POA: Diagnosis not present

## 2021-02-18 DIAGNOSIS — M25519 Pain in unspecified shoulder: Secondary | ICD-10-CM | POA: Diagnosis not present

## 2021-02-18 DIAGNOSIS — I110 Hypertensive heart disease with heart failure: Secondary | ICD-10-CM | POA: Diagnosis not present

## 2021-02-18 DIAGNOSIS — K219 Gastro-esophageal reflux disease without esophagitis: Secondary | ICD-10-CM | POA: Diagnosis not present

## 2021-02-18 DIAGNOSIS — S8012XA Contusion of left lower leg, initial encounter: Secondary | ICD-10-CM | POA: Diagnosis not present

## 2021-02-18 DIAGNOSIS — S81812A Laceration without foreign body, left lower leg, initial encounter: Secondary | ICD-10-CM | POA: Diagnosis not present

## 2021-02-18 DIAGNOSIS — R5381 Other malaise: Secondary | ICD-10-CM | POA: Diagnosis not present

## 2021-02-18 DIAGNOSIS — R0602 Shortness of breath: Secondary | ICD-10-CM | POA: Diagnosis not present

## 2021-02-18 DIAGNOSIS — S40011A Contusion of right shoulder, initial encounter: Secondary | ICD-10-CM | POA: Diagnosis not present

## 2021-02-18 DIAGNOSIS — S41111D Laceration without foreign body of right upper arm, subsequent encounter: Secondary | ICD-10-CM | POA: Diagnosis not present

## 2021-02-18 DIAGNOSIS — I6990 Unspecified sequelae of unspecified cerebrovascular disease: Secondary | ICD-10-CM | POA: Diagnosis not present

## 2021-02-18 DIAGNOSIS — R0989 Other specified symptoms and signs involving the circulatory and respiratory systems: Secondary | ICD-10-CM | POA: Diagnosis not present

## 2021-02-18 DIAGNOSIS — R600 Localized edema: Secondary | ICD-10-CM | POA: Diagnosis not present

## 2021-02-18 DIAGNOSIS — S8002XA Contusion of left knee, initial encounter: Secondary | ICD-10-CM | POA: Diagnosis not present

## 2021-02-18 DIAGNOSIS — Z6841 Body Mass Index (BMI) 40.0 and over, adult: Secondary | ICD-10-CM | POA: Diagnosis not present

## 2021-02-18 DIAGNOSIS — M6281 Muscle weakness (generalized): Secondary | ICD-10-CM | POA: Diagnosis not present

## 2021-02-18 DIAGNOSIS — S81011A Laceration without foreign body, right knee, initial encounter: Secondary | ICD-10-CM | POA: Diagnosis not present

## 2021-02-18 DIAGNOSIS — D649 Anemia, unspecified: Secondary | ICD-10-CM | POA: Diagnosis not present

## 2021-02-18 DIAGNOSIS — Z8673 Personal history of transient ischemic attack (TIA), and cerebral infarction without residual deficits: Secondary | ICD-10-CM | POA: Diagnosis not present

## 2021-02-18 DIAGNOSIS — G4733 Obstructive sleep apnea (adult) (pediatric): Secondary | ICD-10-CM | POA: Diagnosis not present

## 2021-02-18 DIAGNOSIS — N1831 Chronic kidney disease, stage 3a: Secondary | ICD-10-CM | POA: Diagnosis not present

## 2021-02-18 DIAGNOSIS — I4819 Other persistent atrial fibrillation: Secondary | ICD-10-CM | POA: Diagnosis not present

## 2021-02-18 DIAGNOSIS — B370 Candidal stomatitis: Secondary | ICD-10-CM | POA: Diagnosis not present

## 2021-02-18 DIAGNOSIS — I480 Paroxysmal atrial fibrillation: Secondary | ICD-10-CM | POA: Diagnosis not present

## 2021-02-18 DIAGNOSIS — L0390 Cellulitis, unspecified: Secondary | ICD-10-CM | POA: Diagnosis not present

## 2021-02-18 DIAGNOSIS — R1013 Epigastric pain: Secondary | ICD-10-CM | POA: Diagnosis not present

## 2021-02-18 DIAGNOSIS — S51811A Laceration without foreign body of right forearm, initial encounter: Secondary | ICD-10-CM | POA: Diagnosis not present

## 2021-02-18 DIAGNOSIS — I10 Essential (primary) hypertension: Secondary | ICD-10-CM | POA: Diagnosis not present

## 2021-02-18 DIAGNOSIS — R062 Wheezing: Secondary | ICD-10-CM | POA: Diagnosis not present

## 2021-02-18 DIAGNOSIS — Z1159 Encounter for screening for other viral diseases: Secondary | ICD-10-CM | POA: Diagnosis not present

## 2021-02-18 DIAGNOSIS — R262 Difficulty in walking, not elsewhere classified: Secondary | ICD-10-CM | POA: Diagnosis not present

## 2021-02-18 DIAGNOSIS — F411 Generalized anxiety disorder: Secondary | ICD-10-CM | POA: Diagnosis not present

## 2021-02-18 DIAGNOSIS — I739 Peripheral vascular disease, unspecified: Secondary | ICD-10-CM | POA: Diagnosis not present

## 2021-02-18 DIAGNOSIS — R3981 Functional urinary incontinence: Secondary | ICD-10-CM | POA: Diagnosis not present

## 2021-02-18 DIAGNOSIS — I129 Hypertensive chronic kidney disease with stage 1 through stage 4 chronic kidney disease, or unspecified chronic kidney disease: Secondary | ICD-10-CM | POA: Diagnosis not present

## 2021-02-18 LAB — BASIC METABOLIC PANEL
Anion gap: 9 (ref 5–15)
BUN: 15 mg/dL (ref 8–23)
CO2: 26 mmol/L (ref 22–32)
Calcium: 8.5 mg/dL — ABNORMAL LOW (ref 8.9–10.3)
Chloride: 100 mmol/L (ref 98–111)
Creatinine, Ser: 1.04 mg/dL (ref 0.61–1.24)
GFR, Estimated: >60 mL/min (ref >60)
Glucose, Bld: 106 mg/dL — ABNORMAL HIGH (ref 70–99)
Potassium: 4 mmol/L (ref 3.5–5.1)
Sodium: 135 mmol/L (ref 135–145)

## 2021-02-18 LAB — CBC
HCT: 30.4 % — ABNORMAL LOW (ref 39.0–52.0)
Hemoglobin: 8.5 g/dL — ABNORMAL LOW (ref 13.0–17.0)
MCH: 24.4 pg — ABNORMAL LOW (ref 26.0–34.0)
MCHC: 28 g/dL — ABNORMAL LOW (ref 30.0–36.0)
MCV: 87.1 fL (ref 80.0–100.0)
Platelets: 288 10*3/uL (ref 150–400)
RBC: 3.49 MIL/uL — ABNORMAL LOW (ref 4.22–5.81)
RDW: 21.4 % — ABNORMAL HIGH (ref 11.5–15.5)
WBC: 8.5 10*3/uL (ref 4.0–10.5)
nRBC: 0.4 % — ABNORMAL HIGH (ref 0.0–0.2)

## 2021-02-18 LAB — MAGNESIUM: Magnesium: 2.1 mg/dL (ref 1.7–2.4)

## 2021-02-18 MED ORDER — CEPHALEXIN 500 MG PO CAPS
500.0000 mg | ORAL_CAPSULE | Freq: Four times a day (QID) | ORAL | Status: DC
  Administered 2021-02-18: 500 mg via ORAL
  Filled 2021-02-18: qty 1

## 2021-02-18 NOTE — Discharge Summary (Signed)
Physician Discharge Summary   Daniel Eaton.  male DOB: 12/18/1940  YDX:[id number]  PCP: [name], MD  Admit date: 02/14/2021 Discharge date: 02/18/2021  Admitted From: home Disposition:  SNF CODE STATUS: Full code  Discharge Instructions     Discharge wound care:   Complete by: As directed    To left lower leg:  Apply 1% silver sulfadiazine cream, non-adherent dressing and kerlix.  Change daily. [facility] Course:  For full details, please see H&P, progress notes, consult notes and ancillary notes.  Briefly,  Daniel Nan. is a 81 y.o. male with medical history significant of hypertension, COPD, stroke, hypothyroidism, CKD stage IIIa, sCHF, atrial fibrillation on Xarelto, OSA, who presented with left lower leg pain.  Pt stateed that he struck his left leg and sustained a skin tear on Christmas eve. Since then, he has had increasing pain and redness around the area.     Cellulitis of left lower extremity--mild Skin tear following trauma Bilateral Chronic venous stasis changes Patient does not have fever or leukocytosis.  Does not meet criteria for sepsis. --pt received 4 days of ceftriaxone and transitioned to oral Keflex prior to discharge.  Discharged on 6 more days of Keflex. --wound care per order as above.   Atrial fibrillation (HCC) -cont metop and amiodarone --cont xarelto   Essential hypertension -IV hydralazine as needed --cont metop   COPD (chronic obstructive pulmonary disease) (HCC) --cont home Trelegy   Hypothyroidism -Synthroid   Acute renal failure superimposed on stage 3a chronic kidney disease ([place]):  Baseline creatinine 1.21.  On presentation, Cr 1.99, BUN 45.  Likely due to dehydration and continuation of diuretics.   --hold home torsemide, spironolactone and metolazone --Cr improved to 1.22 prior to discharge.    Generalized weakness:  -PT/OT-- recommends rehab    Discharge Diagnoses:  Principal Problem:    Cellulitis of left lower extremity Active Problems:   Atrial fibrillation (HCC)   Essential hypertension   COPD (chronic obstructive pulmonary disease) (HCC)   Hypothyroidism   Stroke (HCC)   Acute renal failure superimposed on stage 3a chronic kidney disease (HCC)   Elevated troponin   Normocytic anemia   Generalized weakness   Acute renal failure superimposed on chronic kidney disease ([place])   30 Day Unplanned Readmission Risk Score    Flowsheet Row ED to Hosp-Admission (Current) from 02/14/2021 in [place]  30 Day Unplanned Readmission Risk Score (%) 16.46 Filed at 02/18/2021 0801       This score is the patient's risk of an unplanned readmission within 30 days of being discharged (0 -100%). The score is based on dignosis, age, lab data, medications, orders, and past utilization.   Low:  0-14.9   Medium: 15-21.9   High: 22-29.9   Extreme: 30 and above         Discharge Instructions:  Allergies as of 02/18/2021       Reactions   Diltiazem Hcl Itching, Other (See Comments)   edema        Medication List     STOP taking these medications    potassium chloride 10 MEQ tablet Commonly known as: KLOR-CON M       TAKE these medications    Acetaminophen 500 MG capsule Take 500 mg by mouth every 4 (four) hours as needed.   albuterol 108 (90 Base) MCG/ACT inhaler Commonly known as: VENTOLIN HFA Inhale 2 puffs  into the lungs every 6 (six) hours as needed for wheezing or shortness of breath. What changed:  how much to take when to take this reasons to take this   amiodarone 200 MG tablet Commonly known as: PACERONE Take 200 mg by mouth daily.   cephALEXin 500 MG capsule Commonly known as: KEFLEX Take 1 capsule (500 mg total) by mouth 4 (four) times daily for 6 days.   levothyroxine 88 MCG tablet Commonly known as: SYNTHROID Take 88 mcg by mouth daily.   loratadine 10 MG tablet Commonly known as: CLARITIN Take 10 mg  by mouth daily.   metolazone 2.5 MG tablet Commonly known as: ZAROXOLYN Hold pending outpatient followup due to acute kidney injury. What changed:  how much to take how to take this when to take this additional instructions   metoprolol succinate 50 MG 24 hr tablet Commonly known as: TOPROL-XL Take 50 mg by mouth daily. Take with or immediately following a meal.   Multi-Vitamins Tabs Take 1 tablet by mouth daily.   OMEGA 3 PO Take 2 capsules by mouth daily.   omeprazole 40 MG capsule Commonly known as: PRILOSEC Take 40 mg by mouth daily.   polyethylene glycol 17 g packet Commonly known as: MIRALAX / GLYCOLAX Take 17 g by mouth daily as needed.   potassium chloride 10 MEQ CR capsule Commonly known as: MICRO-K Hold while not taking diuretics. What changed: See the new instructions.   Rivaroxaban 15 MG Tabs tablet Commonly known as: XARELTO Take 15 mg by mouth daily.   spironolactone 50 MG tablet Commonly known as: ALDACTONE Hold pending outpatient followup due to acute kidney injury. What changed:  how much to take how to take this when to take this additional instructions   torsemide 20 MG tablet Commonly known as: DEMADEX Hold pending outpatient followup due to acute kidney injury. What changed:  how much to take how to take this when to take this additional instructions   Trelegy Ellipta 100-62.5-25 MCG/ACT Aepb Generic drug: Fluticasone-Umeclidin-Vilant Inhale 1 puff into the lungs daily.               Discharge Care Instructions  (From admission, onward)           Start     Ordered   02/18/21 0000  Discharge wound care:       Comments: To left lower leg:  Apply 1% silver sulfadiazine cream, non-adherent dressing and kerlix.  Change daily. - -   02/18/21 6295             Contact information for follow-up providers     [name], MD Follow up.   Specialty: Internal Medicine Contact information: [place] [address] [phone]              Contact information for after-discharge care     Destination     [facility] Preferred SNF .   Service: Skilled Nursing Contact information: [address] [address] [phone]                     Allergies  Allergen Reactions   Diltiazem Hcl Itching and Other (See Comments)    edema     The results of significant diagnostics from this hospitalization (including imaging, microbiology, ancillary and laboratory) are listed below for reference.   Consultations:   Procedures/Studies: US Venous Img Lower Unilateral Left (DVT)  Result Date: 02/14/2021 CLINICAL DATA:  Cellulitis,  edema x1 week EXAM: LEFT LOWER EXTREMITY VENOUS DOPPLER ULTRASOUND TECHNIQUE: Gray-scale sonography with graded compression, as well as color Doppler and duplex ultrasound were performed to evaluate the lower extremity deep venous systems from the level of the common femoral vein and including the common femoral, femoral, profunda femoral, popliteal and calf veins including the posterior tibial, peroneal and gastrocnemius veins when visible. The superficial great saphenous vein was also interrogated. Spectral Doppler was utilized to evaluate flow at rest and with distal augmentation maneuvers in the common femoral, femoral and popliteal veins. COMPARISON:  None. FINDINGS: Contralateral Common Femoral Vein: Respiratory phasicity is normal and symmetric with the symptomatic side. No evidence of thrombus. Normal compressibility. Common Femoral Vein: No evidence of thrombus. Normal compressibility, respiratory phasicity and response to augmentation. Saphenofemoral Junction: No evidence of thrombus. Normal compressibility and flow on color Doppler imaging. Profunda Femoral Vein: No evidence of thrombus. Normal compressibility and flow on color Doppler imaging. Femoral Vein: No evidence of thrombus. Normal  compressibility, respiratory phasicity and response to augmentation. Popliteal Vein: No evidence of thrombus. Normal compressibility, respiratory phasicity and response to augmentation. Calf Veins: Unable to be evaluated due to wound/dressings. Other Findings:  None. IMPRESSION: No evidence of deep venous thrombosis above the level of the knee. The calf veins were unable to be evaluated due to wound/dressings. Electronically Signed   By: Albin Felling M.D.   On: 02/14/2021 12:04   DG Chest Port 1 View  Result Date: 02/14/2021 CLINICAL DATA:  SOB and weakness. EXAM: PORTABLE CHEST 1 VIEW COMPARISON:  03/01/2016 FINDINGS: Cardiac enlargement is unchanged. No pleural effusion or edema. No airspace opacities identified visualized osseous structures appear intact. IMPRESSION: No acute cardiopulmonary abnormalities. Electronically Signed   By: Kerby Moors M.D.   On: 02/14/2021 07:09      Labs: BNP (last 3 results) Recent Labs    02/14/21 0615  BNP 902.4*   Basic Metabolic Panel: Recent Labs  Lab 02/14/21 0615 02/15/21 0432 02/17/21 1056  NA 131* 134* 133*  K 4.2 3.9 3.6  CL 96* 98 101  CO2 23 25 26   GLUCOSE 116* 82 119*  BUN 45* 44* 20  CREATININE 1.99* 1.94* 1.22  CALCIUM 9.5 8.8* 8.7*  MG  --   --  2.3   Liver Function Tests: Recent Labs  Lab 02/14/21 0615  AST 22  ALT 19  ALKPHOS 51  BILITOT 1.0  PROT 7.5  ALBUMIN 3.6   No results for input(s): LIPASE, AMYLASE in the last 168 hours. No results for input(s): AMMONIA in the last 168 hours. CBC: Recent Labs  Lab 02/14/21 0615 02/15/21 0432 02/17/21 1056 02/18/21 0854  WBC 8.4 7.3 7.9 8.5  NEUTROABS 6.5  --   --   --   HGB 9.3* 8.4* 8.1* 8.5*  HCT 32.7* 29.2* 29.2* 30.4*  MCV 84.1 84.6 84.9 87.1  PLT 339 319 298 288   Cardiac Enzymes: No results for input(s): CKTOTAL, CKMB, CKMBINDEX, TROPONINI in the last 168 hours. BNP: Invalid input(s): POCBNP CBG: No results for input(s): GLUCAP in the last 168  hours. D-Dimer No results for input(s): DDIMER in the last 72 hours. Hgb A1c No results for input(s): HGBA1C in the last 72 hours. Lipid Profile No results for input(s): CHOL, HDL, LDLCALC, TRIG, CHOLHDL, LDLDIRECT in the last 72 hours. Thyroid function studies No results for input(s): TSH, T4TOTAL, T3FREE, THYROIDAB in the last 72 hours.  Invalid input(s): FREET3 Anemia work up No results for input(s): VITAMINB12, FOLATE, FERRITIN, TIBC,  IRON, RETICCTPCT in the last 72 hours. Urinalysis    Component Value Date/Time   COLORURINE STRAW (A) 02/14/2021 0616   APPEARANCEUR CLEAR (A) 02/14/2021 0616   LABSPEC 1.009 02/14/2021 0616   PHURINE 6.0 02/14/2021 0616   GLUCOSEU NEGATIVE 02/14/2021 0616   HGBUR NEGATIVE 02/14/2021 0616   BILIRUBINUR NEGATIVE 02/14/2021 0616   KETONESUR NEGATIVE 02/14/2021 0616   PROTEINUR NEGATIVE 02/14/2021 0616   NITRITE NEGATIVE 02/14/2021 0616   LEUKOCYTESUR NEGATIVE 02/14/2021 0616   Sepsis Labs Invalid input(s): PROCALCITONIN,  WBC,  LACTICIDVEN Microbiology Recent Results (from the past 240 hour(s))  Resp Panel by RT-PCR (Flu A&B, Covid) Nasopharyngeal Swab     Status: None   Collection Time: 02/14/21  6:16 AM   Specimen: Nasopharyngeal Swab; Nasopharyngeal(NP) swabs in vial transport medium  Result Value Ref Range Status   SARS Coronavirus 2 by RT PCR NEGATIVE NEGATIVE Final    Comment: (NOTE) SARS-CoV-2 target nucleic acids are NOT DETECTED.  The SARS-CoV-2 RNA is generally detectable in upper respiratory specimens during the acute phase of infection. The lowest concentration of SARS-CoV-2 viral copies this assay can detect is 138 copies/mL. A negative result does not preclude SARS-Cov-2 infection and should not be used as the sole basis for treatment or other patient management decisions. A negative result may occur with  improper specimen collection/handling, submission of specimen other than nasopharyngeal swab, presence of viral  mutation(s) within the areas targeted by this assay, and inadequate number of viral copies(<138 copies/mL). A negative result must be combined with clinical observations, patient history, and epidemiological information. The expected result is Negative.  Fact Sheet for Patients:  EntrepreneurPulse.com.au  Fact Sheet for Healthcare Providers:  IncredibleEmployment.be  This test is no t yet approved or cleared by the [place] FDA and  has been authorized for detection and/or diagnosis of SARS-CoV-2 by FDA under an Emergency Use Authorization (EUA). This EUA will remain  in effect (meaning this test can be used) for the duration of the COVID-19 declaration under Section 564(b)(1) of the Act, 21 U.S.C.section 360bbb-3(b)(1), unless the authorization is terminated  or revoked sooner.       Influenza A by PCR NEGATIVE NEGATIVE Final   Influenza B by PCR NEGATIVE NEGATIVE Final    Comment: (NOTE) The Xpert Xpress SARS-CoV-2/FLU/RSV plus assay is intended as an aid in the diagnosis of influenza from Nasopharyngeal swab specimens and should not be used as a sole basis for treatment. Nasal washings and aspirates are unacceptable for Xpert Xpress SARS-CoV-2/FLU/RSV testing.  Fact Sheet for Patients: EntrepreneurPulse.com.au  Fact Sheet for Healthcare Providers: IncredibleEmployment.be  This test is not yet approved or cleared by the [place] FDA and has been authorized for detection and/or diagnosis of SARS-CoV-2 by FDA under an Emergency Use Authorization (EUA). This EUA will remain in effect (meaning this test can be used) for the duration of the COVID-19 declaration under Section 564(b)(1) of the Act, 21 U.S.C. section 360bbb-3(b)(1), unless the authorization is terminated or revoked.  Performed at [facility], [address] [place], [place] [address]   Blood culture (routine x 2)      Status: None (Preliminary result)   Collection Time: 02/14/21  9:01 AM   Specimen: BLOOD  Result Value Ref Range Status   Specimen Description BLOOD BLOOD LEFT FOREARM  Final   Special Requests   Final    BOTTLES DRAWN AEROBIC AND ANAEROBIC Blood Culture results may not be optimal due to an inadequate volume of blood received in  culture bottles   Culture   Final    NO GROWTH 4 DAYS Performed at [facility], [address] [place], [place] [address]    Report Status PENDING  Incomplete  Blood culture (routine x 2)     Status: None (Preliminary result)   Collection Time: 02/14/21  9:01 AM   Specimen: BLOOD  Result Value Ref Range Status   Specimen Description BLOOD RIGHT ANTECUBITAL  Final   Special Requests   Final    BOTTLES DRAWN AEROBIC AND ANAEROBIC Blood Culture adequate volume   Culture   Final    NO GROWTH 4 DAYS Performed at [facility], [address], [place], [place] [address]    Report Status PENDING  Incomplete  Resp Panel by RT-PCR (Flu A&B, Covid) Nasopharyngeal Swab     Status: None   Collection Time: 02/17/21 11:56 AM   Specimen: Nasopharyngeal Swab; Nasopharyngeal(NP) swabs in vial transport medium  Result Value Ref Range Status   SARS Coronavirus 2 by RT PCR NEGATIVE NEGATIVE Final    Comment: (NOTE) SARS-CoV-2 target nucleic acids are NOT DETECTED.  The SARS-CoV-2 RNA is generally detectable in upper respiratory specimens during the acute phase of infection. The lowest concentration of SARS-CoV-2 viral copies this assay can detect is 138 copies/mL. A negative result does not preclude SARS-Cov-2 infection and should not be used as the sole basis for treatment or other patient management decisions. A negative result may occur with  improper specimen collection/handling, submission of specimen other than nasopharyngeal swab, presence of viral mutation(s) within the areas targeted by this assay, and inadequate number of viral copies(<138  copies/mL). A negative result must be combined with clinical observations, patient history, and epidemiological information. The expected result is Negative.  Fact Sheet for Patients:  EntrepreneurPulse.com.au  Fact Sheet for Healthcare Providers:  IncredibleEmployment.be  This test is no t yet approved or cleared by the [place] FDA and  has been authorized for detection and/or diagnosis of SARS-CoV-2 by FDA under an Emergency Use Authorization (EUA). This EUA will remain  in effect (meaning this test can be used) for the duration of the COVID-19 declaration under Section 564(b)(1) of the Act, 21 U.S.C.section 360bbb-3(b)(1), unless the authorization is terminated  or revoked sooner.       Influenza A by PCR NEGATIVE NEGATIVE Final   Influenza B by PCR NEGATIVE NEGATIVE Final    Comment: (NOTE) The Xpert Xpress SARS-CoV-2/FLU/RSV plus assay is intended as an aid in the diagnosis of influenza from Nasopharyngeal swab specimens and should not be used as a sole basis for treatment. Nasal washings and aspirates are unacceptable for Xpert Xpress SARS-CoV-2/FLU/RSV testing.  Fact Sheet for Patients: EntrepreneurPulse.com.au  Fact Sheet for Healthcare Providers: IncredibleEmployment.be  This test is not yet approved or cleared by the [place] FDA and has been authorized for detection and/or diagnosis of SARS-CoV-2 by FDA under an Emergency Use Authorization (EUA). This EUA will remain in effect (meaning this test can be used) for the duration of the COVID-19 declaration under Section 564(b)(1) of the Act, 21 U.S.C. section 360bbb-3(b)(1), unless the authorization is terminated or revoked.  Performed at [facility], [address] [place], [place] [address]      Total time spend on discharging this patient, including the last patient exam, discussing the hospital stay, instructions  for ongoing care as it relates to all pertinent caregivers, as well as preparing the medical discharge records, prescriptions, and/or referrals as applicable, is 30 minutes.  Enzo Bi, MD  [facility] 02/18/2021, 9:24 AM

## 2021-02-18 NOTE — Progress Notes (Addendum)
Patient discharged to [facility] at 1100 with transportation to facility provided by EMS.  Prior to discharge, patient received education regarding AVS including changes to medications and follow-up appointments with patient verbalizing understanding of reviewed material and denied any  questions or concerns.  Patient's IV was removed prior to discharge with all belongings collected and transported to with EMS to facility.    1130: Attempted to call report to facility x2 with no response.

## 2021-02-18 NOTE — TOC Transition Note (Signed)
Transition of Care [facility]) - CM/SW Discharge Note   Patient Details  Name: Daniel Eaton. MRN: [id number] Date of Birth: [birth year]  [facility] CM/SW Contact:  [name], RN Phone Number: 02/18/2021, 9:50 AM   Clinical Narrative:     Patient will DC [id number] Anticipated DC date: 02/18/21  Family notified:Patricia Transport by:EMS  Per MD patient ready for DC to . RN, patient, patient's family, and facility notified of DC. Discharge Summary sent to facility. RN given number for report. DC packet on chart. Ambulance transport requested for patient.  TOC signing off.  Daniel Eaton [facility] [phone]   Final next level of care: Skilled Nursing Facility Barriers to Discharge: Barriers Resolved   Patient Goals and CMS Choice        Discharge Placement              Patient chooses bed at: [facility] Patient to be transferred to facility by: EMS Name of family member notified: Mardene Celeste Patient and family notified of of transfer: 02/18/21  Discharge Plan and Services   Discharge Planning Services: CM Consult                                 Social Determinants of Health (Faith) Interventions     Readmission Risk Interventions No flowsheet data found.

## 2021-02-19 LAB — CULTURE, BLOOD (ROUTINE X 2)
Culture: NO GROWTH
Culture: NO GROWTH
Special Requests: ADEQUATE

## 2021-02-24 DIAGNOSIS — S81812A Laceration without foreign body, left lower leg, initial encounter: Secondary | ICD-10-CM | POA: Diagnosis not present

## 2021-02-25 DIAGNOSIS — I480 Paroxysmal atrial fibrillation: Secondary | ICD-10-CM | POA: Diagnosis not present

## 2021-02-25 DIAGNOSIS — N1831 Chronic kidney disease, stage 3a: Secondary | ICD-10-CM | POA: Diagnosis not present

## 2021-02-25 DIAGNOSIS — E039 Hypothyroidism, unspecified: Secondary | ICD-10-CM | POA: Diagnosis not present

## 2021-02-25 DIAGNOSIS — I10 Essential (primary) hypertension: Secondary | ICD-10-CM | POA: Diagnosis not present

## 2021-03-01 DIAGNOSIS — S41111A Laceration without foreign body of right upper arm, initial encounter: Secondary | ICD-10-CM | POA: Diagnosis not present

## 2021-03-01 DIAGNOSIS — S81812A Laceration without foreign body, left lower leg, initial encounter: Secondary | ICD-10-CM | POA: Diagnosis not present

## 2021-03-02 DIAGNOSIS — M25519 Pain in unspecified shoulder: Secondary | ICD-10-CM | POA: Diagnosis not present

## 2021-03-02 DIAGNOSIS — M79605 Pain in left leg: Secondary | ICD-10-CM | POA: Diagnosis not present

## 2021-03-02 DIAGNOSIS — M6281 Muscle weakness (generalized): Secondary | ICD-10-CM | POA: Diagnosis not present

## 2021-03-02 DIAGNOSIS — R0602 Shortness of breath: Secondary | ICD-10-CM | POA: Diagnosis not present

## 2021-03-02 DIAGNOSIS — J449 Chronic obstructive pulmonary disease, unspecified: Secondary | ICD-10-CM | POA: Diagnosis not present

## 2021-03-02 DIAGNOSIS — R062 Wheezing: Secondary | ICD-10-CM | POA: Diagnosis not present

## 2021-03-02 DIAGNOSIS — L03116 Cellulitis of left lower limb: Secondary | ICD-10-CM | POA: Diagnosis not present

## 2021-03-02 DIAGNOSIS — R262 Difficulty in walking, not elsewhere classified: Secondary | ICD-10-CM | POA: Diagnosis not present

## 2021-03-03 ENCOUNTER — Ambulatory Visit: Payer: HMO

## 2021-03-03 DIAGNOSIS — R062 Wheezing: Secondary | ICD-10-CM | POA: Diagnosis not present

## 2021-03-03 DIAGNOSIS — J449 Chronic obstructive pulmonary disease, unspecified: Secondary | ICD-10-CM | POA: Diagnosis not present

## 2021-03-03 DIAGNOSIS — R0989 Other specified symptoms and signs involving the circulatory and respiratory systems: Secondary | ICD-10-CM | POA: Diagnosis not present

## 2021-03-03 DIAGNOSIS — R051 Acute cough: Secondary | ICD-10-CM | POA: Diagnosis not present

## 2021-03-05 ENCOUNTER — Other Ambulatory Visit: Payer: Self-pay

## 2021-03-05 ENCOUNTER — Emergency Department (HOSPITAL_COMMUNITY)
Admission: EM | Admit: 2021-03-05 | Discharge: 2021-03-06 | Disposition: A | Discharge status: Home / Self Care | Payer: HMO | Attending: Emergency Medicine | Admitting: Emergency Medicine

## 2021-03-05 DIAGNOSIS — S40011A Contusion of right shoulder, initial encounter: Secondary | ICD-10-CM | POA: Insufficient documentation

## 2021-03-05 DIAGNOSIS — W19XXXA Unspecified fall, initial encounter: Secondary | ICD-10-CM

## 2021-03-05 DIAGNOSIS — I509 Heart failure, unspecified: Secondary | ICD-10-CM | POA: Insufficient documentation

## 2021-03-05 DIAGNOSIS — Z87891 Personal history of nicotine dependence: Secondary | ICD-10-CM | POA: Diagnosis not present

## 2021-03-05 DIAGNOSIS — I110 Hypertensive heart disease with heart failure: Secondary | ICD-10-CM | POA: Diagnosis not present

## 2021-03-05 DIAGNOSIS — W1789XA Other fall from one level to another, initial encounter: Secondary | ICD-10-CM | POA: Diagnosis not present

## 2021-03-05 DIAGNOSIS — S8012XA Contusion of left lower leg, initial encounter: Secondary | ICD-10-CM | POA: Insufficient documentation

## 2021-03-05 DIAGNOSIS — S8002XA Contusion of left knee, initial encounter: Secondary | ICD-10-CM | POA: Insufficient documentation

## 2021-03-05 DIAGNOSIS — J449 Chronic obstructive pulmonary disease, unspecified: Secondary | ICD-10-CM | POA: Insufficient documentation

## 2021-03-05 DIAGNOSIS — E039 Hypothyroidism, unspecified: Secondary | ICD-10-CM | POA: Diagnosis not present

## 2021-03-05 DIAGNOSIS — S8992XA Unspecified injury of left lower leg, initial encounter: Secondary | ICD-10-CM | POA: Diagnosis present

## 2021-03-05 DIAGNOSIS — R58 Hemorrhage, not elsewhere classified: Secondary | ICD-10-CM | POA: Diagnosis not present

## 2021-03-05 DIAGNOSIS — T148XXA Other injury of unspecified body region, initial encounter: Secondary | ICD-10-CM

## 2021-03-05 NOTE — ED Triage Notes (Signed)
BIB [facility] from [facility]. Pt is there for rehab for about a week. Pt has c/o leg pain, bilateral shoulder pain. Cellulitis on legs. Chronic pain.   134 Bp palpated.  88 97%

## 2021-03-06 DIAGNOSIS — S8002XA Contusion of left knee, initial encounter: Secondary | ICD-10-CM | POA: Diagnosis not present

## 2021-03-06 MED ORDER — ACETAMINOPHEN 500 MG PO TABS
500.0000 mg | ORAL_TABLET | Freq: Once | ORAL | Status: AC
  Administered 2021-03-06: 500 mg via ORAL
  Filled 2021-03-06: qty 1

## 2021-03-06 NOTE — ED Notes (Signed)
Pt requested Tylenol. Secure chat sent to Dr. Alvino Chapel.

## 2021-03-06 NOTE — ED Provider Notes (Signed)
[facility] Emergency Department Provider Note MRN:  [id number]  Arrival date & time: 03/06/21     Chief Complaint   Leg Pain and Shoulder Pain   History of Present Illness   Daniel Chandran. is a 81 y.o. year-old male with a history of A. fib, CHF, COPD presenting to the ED with chief complaint of leg pain and shoulder pain.  Patient explains that he has been at his rehab facility and he is not getting enough care.  He has fallen multiple times while at rehab.  He explains that they do not have enough nurse techs and he is stuck in bed for a long part of the day.  He is overall unhappy with the care being provided.  He denies any recent head trauma status.  He has some new pain and bruising to his left knee and left which has been present for 2 days.  No fever, no other complaints.  Review of Systems  A thorough review of systems was obtained and all systems are negative except as noted in the HPI and PMH.   Patient's Health History    Past Medical History:  Diagnosis Date   A-fib ([place])    Cellulitis    CHF (congestive heart failure) (HCC)    COPD (chronic obstructive pulmonary disease) ([place])    Difficult intubation    Hyperlipidemia    Hypertension    Hypothyroidism    Kidney disease    Sleep apnea    Stroke [facility])    Thyroid disease     Past Surgical History:  Procedure Laterality Date   CARDIAC CATHETERIZATION     CATARACT EXTRACTION W/ INTRAOCULAR LENS  IMPLANT, BILATERAL     COLONOSCOPY  2008   HERNIA REPAIR     bilateral inguinal hernia/ [facility]   HERNIA REPAIR  02/02/2015   18 x 28 cm ventral light mesh placed laparoscopically.   TONSILLECTOMY     UMBILICAL HERNIA REPAIR N/A 02/02/2015   Procedure: HERNIA REPAIR UMBILICAL ADULT;  Surgeon: Robert Bellow, MD;  Location: [facility] ORS;  Service: General;  Laterality: N/A;   VENTRAL HERNIA REPAIR N/A 02/02/2015   Procedure: LAPAROSCOPIC VENTRAL HERNIA;  Surgeon: Robert Bellow, MD;   Location: [facility] ORS;  Service: General;  Laterality: N/A;   vp shunt placement  1979    Family History  Problem Relation Age of Onset   Heart disease Mother    Alcohol abuse Father     Social History   Socioeconomic History   Marital status: Married    Spouse name: Not on file   Number of children: Not on file   Years of education: Not on file   Highest education level: Not on file  Occupational History   Not on file  Tobacco Use   Smoking status: Former   Smokeless tobacco: Never  Vaping Use   Vaping Use: Never used  Substance and Sexual Activity   Alcohol use: No   Drug use: No   Sexual activity: Not Currently  Other Topics Concern   Not on file  Social History Narrative   Not on file   Social Determinants of Health   Financial Resource Strain: Not on file  Food Insecurity: Not on file  Transportation Needs: Not on file  Physical Activity: Not on file  Stress: Not on file  Social Connections: Not on file  Intimate Partner Violence: Not on file     Physical Exam   Vitals:  03/05/21 2241 03/05/21 2244  BP:  134/65  Pulse:  95  Resp:  18  Temp: 98.1 F (36.7 C)   SpO2:  99%    CONSTITUTIONAL: Well-appearing, NAD NEURO/PSYCH:  Alert and oriented x 3, no focal deficits EYES:  eyes equal and reactive ENT/NECK:  no LAD, no JVD CARDIO: Regular rate, well-perfused, normal S1 and S2 PULM:  CTAB no wheezing or rhonchi GI/GU:  non-distended, non-tender MSK/SPINE:  No gross deformities, no edema SKIN: Chronic hyperpigmentation of the lower extremities, wounds to the left lower extremity, right elbow with dressings clean dry and intact   *Additional and/or pertinent findings included in MDM below  Diagnostic and Interventional Summary    EKG Interpretation  Date/Time:    Ventricular Rate:    PR Interval:    QRS Duration:   QT Interval:    QTC Calculation:   R Axis:     Text Interpretation:         Labs Reviewed - No data to display  No orders to  display    Medications - No data to display   Procedures  /  Critical Care Procedures  ED Course and Medical Decision Making  Initial Impression and Ddx Patient is here mostly because he wants to go to a different rehab facility.  He has had some recent falls but he denies any serious injuries.  He has normal range of motion of the arms and legs, he denies any head trauma, he has no bony tenderness to the spine, no abdominal pain, no chest pain or shortness of breath.  There is currently no indication for laboratory testing, no indication for imaging, no indication for admission.  Patient requesting admission but explained that this is not indicated.  Patient requesting that we transfer him to a different rehab facility but it was explained that this is not a function of the emergency department, he will have to go back to his care facility and discuss his concerns and our desire for transfer with case management or the facility director.  No emergent process, appropriate for discharge.  Past medical/surgical history that increases complexity of ED encounter: A. fib, COPD  Interpretation of Diagnostics Not applicable  Patient Reassessment and Ultimate Disposition/Management Discharge home  Patient management required discussion with the following services or consulting groups:  None  Complexity of Problems Addressed Chronic illness with exacerbation  Additional Data Reviewed and Analyzed Further history obtained from: Recent discharge summary  Factors Impacting ED Encounter Risk None  Barth Kirks. Sedonia Small, MD [place] [email or web address]  Final Clinical Impressions(s) / ED Diagnoses     ICD-10-CM   1. Fall, initial encounter  W19.XXXA     2. Bruising  [email or web address]       ED Discharge Orders     None        Discharge Instructions Discussed with and Provided to Patient:    Discharge Instructions      You were evaluated in  the Emergency Department and after careful evaluation, we did not find any emergent condition requiring admission or further testing in the hospital.  Your exam/testing today was overall reassuring.  If you desire transfer to a different rehab facility will need to talk to your facility director or case manager.  Please return to the Emergency Department if you experience any worsening of your condition.  Thank you for allowing [place] to be a part of your care.       Gerlene Fee  M, MD 03/06/21 3817

## 2021-03-06 NOTE — ED Notes (Signed)
Attempted to call report to facility. NO answer

## 2021-03-06 NOTE — ED Notes (Signed)
Meal tray delivered to pt at this time.  

## 2021-03-06 NOTE — ED Notes (Signed)
Meal tray ordered for pt  

## 2021-03-06 NOTE — Discharge Instructions (Signed)
You were evaluated in the Emergency Department and after careful evaluation, we did not find any emergent condition requiring admission or further testing in the hospital.  Your exam/testing today was overall reassuring.  If you desire transfer to a different rehab facility will need to talk to your facility director or case manager.  Please return to the Emergency Department if you experience any worsening of your condition.  Thank you for allowing [place] to be a part of your care.

## 2021-03-06 NOTE — ED Notes (Signed)
PTAR at bedside preparing to transport pt back to facility.

## 2021-03-06 NOTE — ED Notes (Signed)
Pt cleaned up of urinary and bowel incontinence. Pt rolled with assistance of NT Bri and linens changed. After pt settled and fresh linens and brief on pt, small skin tear noted to L elbow. Cleaned with water and dressing placed.

## 2021-03-08 DIAGNOSIS — R5381 Other malaise: Secondary | ICD-10-CM | POA: Diagnosis not present

## 2021-03-08 DIAGNOSIS — I129 Hypertensive chronic kidney disease with stage 1 through stage 4 chronic kidney disease, or unspecified chronic kidney disease: Secondary | ICD-10-CM | POA: Diagnosis not present

## 2021-03-08 DIAGNOSIS — I4819 Other persistent atrial fibrillation: Secondary | ICD-10-CM | POA: Diagnosis not present

## 2021-03-08 DIAGNOSIS — R0602 Shortness of breath: Secondary | ICD-10-CM | POA: Diagnosis not present

## 2021-03-08 DIAGNOSIS — N183 Chronic kidney disease, stage 3 unspecified: Secondary | ICD-10-CM | POA: Diagnosis not present

## 2021-03-08 DIAGNOSIS — S81812A Laceration without foreign body, left lower leg, initial encounter: Secondary | ICD-10-CM | POA: Diagnosis not present

## 2021-03-08 DIAGNOSIS — S41111A Laceration without foreign body of right upper arm, initial encounter: Secondary | ICD-10-CM | POA: Diagnosis not present

## 2021-03-08 DIAGNOSIS — R0989 Other specified symptoms and signs involving the circulatory and respiratory systems: Secondary | ICD-10-CM | POA: Diagnosis not present

## 2021-03-08 DIAGNOSIS — R051 Acute cough: Secondary | ICD-10-CM | POA: Diagnosis not present

## 2021-03-09 DIAGNOSIS — M25519 Pain in unspecified shoulder: Secondary | ICD-10-CM | POA: Diagnosis not present

## 2021-03-09 DIAGNOSIS — M79605 Pain in left leg: Secondary | ICD-10-CM | POA: Diagnosis not present

## 2021-03-09 DIAGNOSIS — M6281 Muscle weakness (generalized): Secondary | ICD-10-CM | POA: Diagnosis not present

## 2021-03-09 DIAGNOSIS — R262 Difficulty in walking, not elsewhere classified: Secondary | ICD-10-CM | POA: Diagnosis not present

## 2021-03-10 ENCOUNTER — Ambulatory Visit: Payer: HMO

## 2021-03-10 DIAGNOSIS — M6281 Muscle weakness (generalized): Secondary | ICD-10-CM | POA: Diagnosis not present

## 2021-03-10 DIAGNOSIS — U071 COVID-19: Secondary | ICD-10-CM | POA: Diagnosis not present

## 2021-03-10 DIAGNOSIS — R5381 Other malaise: Secondary | ICD-10-CM | POA: Diagnosis not present

## 2021-03-10 DIAGNOSIS — R062 Wheezing: Secondary | ICD-10-CM | POA: Diagnosis not present

## 2021-03-12 DIAGNOSIS — R600 Localized edema: Secondary | ICD-10-CM | POA: Diagnosis not present

## 2021-03-12 DIAGNOSIS — U071 COVID-19: Secondary | ICD-10-CM | POA: Diagnosis not present

## 2021-03-12 DIAGNOSIS — M6281 Muscle weakness (generalized): Secondary | ICD-10-CM | POA: Diagnosis not present

## 2021-03-15 DIAGNOSIS — S51811A Laceration without foreign body of right forearm, initial encounter: Secondary | ICD-10-CM | POA: Diagnosis not present

## 2021-03-15 DIAGNOSIS — S81011A Laceration without foreign body, right knee, initial encounter: Secondary | ICD-10-CM | POA: Diagnosis not present

## 2021-03-17 ENCOUNTER — Ambulatory Visit: Payer: HMO

## 2021-03-17 DIAGNOSIS — M6281 Muscle weakness (generalized): Secondary | ICD-10-CM | POA: Diagnosis not present

## 2021-03-17 DIAGNOSIS — J449 Chronic obstructive pulmonary disease, unspecified: Secondary | ICD-10-CM | POA: Diagnosis not present

## 2021-03-17 DIAGNOSIS — U071 COVID-19: Secondary | ICD-10-CM | POA: Diagnosis not present

## 2021-03-17 DIAGNOSIS — R600 Localized edema: Secondary | ICD-10-CM | POA: Diagnosis not present

## 2021-03-18 DIAGNOSIS — R1013 Epigastric pain: Secondary | ICD-10-CM | POA: Diagnosis not present

## 2021-03-18 DIAGNOSIS — F411 Generalized anxiety disorder: Secondary | ICD-10-CM | POA: Diagnosis not present

## 2021-03-18 DIAGNOSIS — R3981 Functional urinary incontinence: Secondary | ICD-10-CM | POA: Diagnosis not present

## 2021-03-18 DIAGNOSIS — B370 Candidal stomatitis: Secondary | ICD-10-CM | POA: Diagnosis not present

## 2021-03-19 DIAGNOSIS — N179 Acute kidney failure, unspecified: Secondary | ICD-10-CM | POA: Diagnosis not present

## 2021-03-22 DIAGNOSIS — S51811A Laceration without foreign body of right forearm, initial encounter: Secondary | ICD-10-CM | POA: Diagnosis not present

## 2021-03-22 DIAGNOSIS — S41111D Laceration without foreign body of right upper arm, subsequent encounter: Secondary | ICD-10-CM | POA: Diagnosis not present

## 2021-03-24 ENCOUNTER — Ambulatory Visit: Payer: HMO | Admitting: Physical Therapy

## 2021-03-24 ENCOUNTER — Telehealth: Payer: Self-pay | Admitting: Physical Therapy

## 2021-03-24 NOTE — Telephone Encounter (Signed)
Called patient on 03/23/21 informing him of PT appointment on 2/8. Patient was recently hospitalized and DC to SNF rehab. Requested patient to call back and let us know if he would like to continue with outpatient PT or if he is still in rehab.  Hillis Range, PT, DPT 03/24/2021 1:30 PM

## 2021-03-31 ENCOUNTER — Ambulatory Visit: Payer: HMO

## 2021-04-06 ENCOUNTER — Ambulatory Visit: Payer: HMO

## 2021-04-07 ENCOUNTER — Ambulatory Visit: Payer: HMO

## 2021-04-09 DIAGNOSIS — R062 Wheezing: Secondary | ICD-10-CM | POA: Diagnosis not present

## 2021-04-09 DIAGNOSIS — R0602 Shortness of breath: Secondary | ICD-10-CM | POA: Diagnosis not present

## 2021-04-09 DIAGNOSIS — R0989 Other specified symptoms and signs involving the circulatory and respiratory systems: Secondary | ICD-10-CM | POA: Diagnosis not present

## 2021-04-09 DIAGNOSIS — R5381 Other malaise: Secondary | ICD-10-CM | POA: Diagnosis not present

## 2021-04-12 ENCOUNTER — Ambulatory Visit: Payer: HMO

## 2021-04-12 DIAGNOSIS — I10 Essential (primary) hypertension: Secondary | ICD-10-CM | POA: Diagnosis not present

## 2021-04-13 DIAGNOSIS — I480 Paroxysmal atrial fibrillation: Secondary | ICD-10-CM | POA: Diagnosis not present

## 2021-04-13 DIAGNOSIS — I739 Peripheral vascular disease, unspecified: Secondary | ICD-10-CM | POA: Diagnosis not present

## 2021-04-13 DIAGNOSIS — S51011A Laceration without foreign body of right elbow, initial encounter: Secondary | ICD-10-CM | POA: Diagnosis not present

## 2021-04-13 DIAGNOSIS — I872 Venous insufficiency (chronic) (peripheral): Secondary | ICD-10-CM | POA: Diagnosis not present

## 2021-04-14 ENCOUNTER — Ambulatory Visit: Payer: HMO

## 2021-04-19 DIAGNOSIS — F5105 Insomnia due to other mental disorder: Secondary | ICD-10-CM | POA: Diagnosis not present

## 2021-04-19 DIAGNOSIS — F331 Major depressive disorder, recurrent, moderate: Secondary | ICD-10-CM | POA: Diagnosis not present

## 2021-04-19 DIAGNOSIS — F4322 Adjustment disorder with anxiety: Secondary | ICD-10-CM | POA: Diagnosis not present

## 2021-04-20 ENCOUNTER — Ambulatory Visit: Payer: HMO

## 2021-04-20 DIAGNOSIS — S51011A Laceration without foreign body of right elbow, initial encounter: Secondary | ICD-10-CM | POA: Diagnosis not present

## 2021-04-20 DIAGNOSIS — I872 Venous insufficiency (chronic) (peripheral): Secondary | ICD-10-CM | POA: Diagnosis not present

## 2021-04-20 DIAGNOSIS — I480 Paroxysmal atrial fibrillation: Secondary | ICD-10-CM | POA: Diagnosis not present

## 2021-04-20 DIAGNOSIS — I739 Peripheral vascular disease, unspecified: Secondary | ICD-10-CM | POA: Diagnosis not present

## 2021-04-21 DIAGNOSIS — N1831 Chronic kidney disease, stage 3a: Secondary | ICD-10-CM | POA: Diagnosis not present

## 2021-04-21 DIAGNOSIS — Z6841 Body Mass Index (BMI) 40.0 and over, adult: Secondary | ICD-10-CM | POA: Diagnosis not present

## 2021-04-21 DIAGNOSIS — I10 Essential (primary) hypertension: Secondary | ICD-10-CM | POA: Diagnosis not present

## 2021-04-21 DIAGNOSIS — I509 Heart failure, unspecified: Secondary | ICD-10-CM | POA: Diagnosis not present

## 2021-04-21 DIAGNOSIS — J449 Chronic obstructive pulmonary disease, unspecified: Secondary | ICD-10-CM | POA: Diagnosis not present

## 2021-04-21 DIAGNOSIS — L03116 Cellulitis of left lower limb: Secondary | ICD-10-CM | POA: Diagnosis not present

## 2021-04-21 DIAGNOSIS — N179 Acute kidney failure, unspecified: Secondary | ICD-10-CM | POA: Diagnosis not present

## 2021-04-21 DIAGNOSIS — D649 Anemia, unspecified: Secondary | ICD-10-CM | POA: Diagnosis not present

## 2021-04-21 DIAGNOSIS — Z1159 Encounter for screening for other viral diseases: Secondary | ICD-10-CM | POA: Diagnosis not present

## 2021-04-21 DIAGNOSIS — E039 Hypothyroidism, unspecified: Secondary | ICD-10-CM | POA: Diagnosis not present

## 2021-04-21 DIAGNOSIS — L0390 Cellulitis, unspecified: Secondary | ICD-10-CM | POA: Diagnosis not present

## 2021-04-21 DIAGNOSIS — K219 Gastro-esophageal reflux disease without esophagitis: Secondary | ICD-10-CM | POA: Diagnosis not present

## 2021-04-22 ENCOUNTER — Ambulatory Visit: Payer: HMO | Admitting: Physical Therapy

## 2021-04-22 DIAGNOSIS — R5383 Other fatigue: Secondary | ICD-10-CM | POA: Diagnosis not present

## 2021-04-22 DIAGNOSIS — L0390 Cellulitis, unspecified: Secondary | ICD-10-CM | POA: Diagnosis not present

## 2021-04-22 DIAGNOSIS — Z743 Need for continuous supervision: Secondary | ICD-10-CM | POA: Diagnosis not present

## 2021-04-22 DIAGNOSIS — G459 Transient cerebral ischemic attack, unspecified: Secondary | ICD-10-CM | POA: Diagnosis not present

## 2021-04-22 DIAGNOSIS — R531 Weakness: Secondary | ICD-10-CM | POA: Diagnosis not present

## 2021-04-23 DIAGNOSIS — E039 Hypothyroidism, unspecified: Secondary | ICD-10-CM | POA: Diagnosis not present

## 2021-04-23 DIAGNOSIS — M6281 Muscle weakness (generalized): Secondary | ICD-10-CM | POA: Diagnosis not present

## 2021-04-23 DIAGNOSIS — R1314 Dysphagia, pharyngoesophageal phase: Secondary | ICD-10-CM | POA: Diagnosis not present

## 2021-04-23 DIAGNOSIS — I509 Heart failure, unspecified: Secondary | ICD-10-CM | POA: Diagnosis not present

## 2021-04-23 DIAGNOSIS — R1310 Dysphagia, unspecified: Secondary | ICD-10-CM | POA: Diagnosis not present

## 2021-04-23 DIAGNOSIS — R2689 Other abnormalities of gait and mobility: Secondary | ICD-10-CM | POA: Diagnosis not present

## 2021-04-23 DIAGNOSIS — R799 Abnormal finding of blood chemistry, unspecified: Secondary | ICD-10-CM | POA: Diagnosis not present

## 2021-04-26 ENCOUNTER — Ambulatory Visit: Payer: HMO | Admitting: Physical Therapy

## 2021-04-27 ENCOUNTER — Ambulatory Visit: Payer: HMO

## 2021-04-28 ENCOUNTER — Ambulatory Visit: Payer: HMO

## 2021-04-28 DIAGNOSIS — L98419 Non-pressure chronic ulcer of buttock with unspecified severity: Secondary | ICD-10-CM | POA: Diagnosis not present

## 2021-04-29 ENCOUNTER — Ambulatory Visit: Payer: HMO

## 2021-05-04 ENCOUNTER — Ambulatory Visit: Payer: HMO

## 2021-05-05 DIAGNOSIS — L98419 Non-pressure chronic ulcer of buttock with unspecified severity: Secondary | ICD-10-CM | POA: Diagnosis not present

## 2021-05-06 ENCOUNTER — Ambulatory Visit: Payer: HMO

## 2021-05-10 DIAGNOSIS — L98411 Non-pressure chronic ulcer of buttock limited to breakdown of skin: Secondary | ICD-10-CM | POA: Diagnosis not present

## 2021-05-11 ENCOUNTER — Ambulatory Visit: Payer: HMO

## 2021-05-12 DIAGNOSIS — L98419 Non-pressure chronic ulcer of buttock with unspecified severity: Secondary | ICD-10-CM | POA: Diagnosis not present

## 2021-05-13 ENCOUNTER — Ambulatory Visit: Payer: HMO

## 2021-05-15 DIAGNOSIS — R1310 Dysphagia, unspecified: Secondary | ICD-10-CM | POA: Diagnosis not present

## 2021-05-15 DIAGNOSIS — R2689 Other abnormalities of gait and mobility: Secondary | ICD-10-CM | POA: Diagnosis not present

## 2021-05-15 DIAGNOSIS — R1314 Dysphagia, pharyngoesophageal phase: Secondary | ICD-10-CM | POA: Diagnosis not present

## 2021-05-15 DIAGNOSIS — M6281 Muscle weakness (generalized): Secondary | ICD-10-CM | POA: Diagnosis not present

## 2021-05-17 DIAGNOSIS — I4819 Other persistent atrial fibrillation: Secondary | ICD-10-CM | POA: Diagnosis not present

## 2021-05-17 DIAGNOSIS — N183 Chronic kidney disease, stage 3 unspecified: Secondary | ICD-10-CM | POA: Diagnosis not present

## 2021-05-17 DIAGNOSIS — I129 Hypertensive chronic kidney disease with stage 1 through stage 4 chronic kidney disease, or unspecified chronic kidney disease: Secondary | ICD-10-CM | POA: Diagnosis not present

## 2021-05-18 ENCOUNTER — Ambulatory Visit: Payer: HMO

## 2021-05-19 DIAGNOSIS — L98419 Non-pressure chronic ulcer of buttock with unspecified severity: Secondary | ICD-10-CM | POA: Diagnosis not present

## 2021-05-19 IMAGING — CR DG KNEE COMPLETE 4+V*L*
1 series · 4 of 4 positions shown · non-contrast
Comparison: None.

CLINICAL DATA: Recent fall with left knee pain, initial encounter

EXAM:
LEFT KNEE - COMPLETE 4+ VIEW

[Series 1: dg knee complete 4 views left · 0.14mm/px · 4 of 4 slices shown]
[im 1/4]
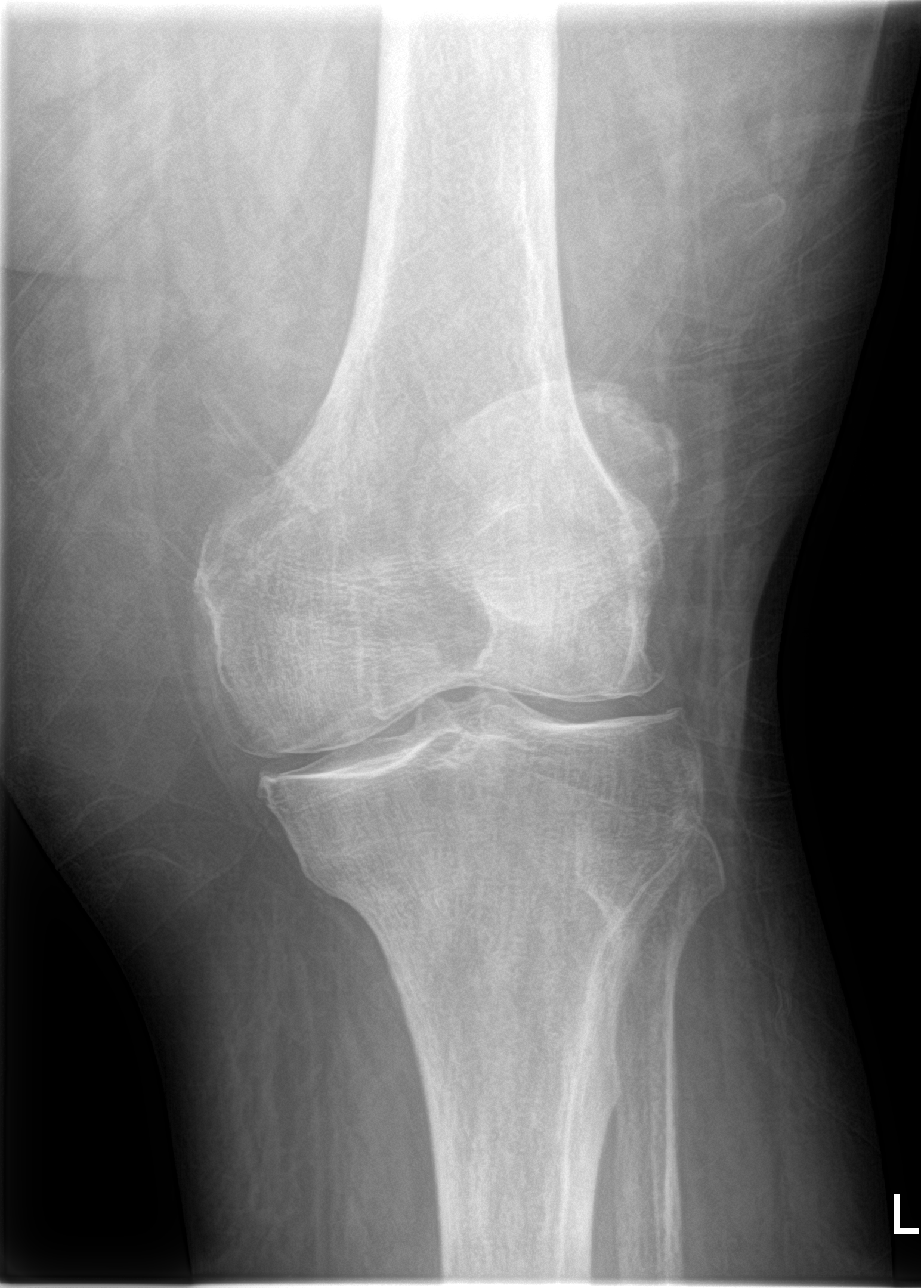
[im 2/4]
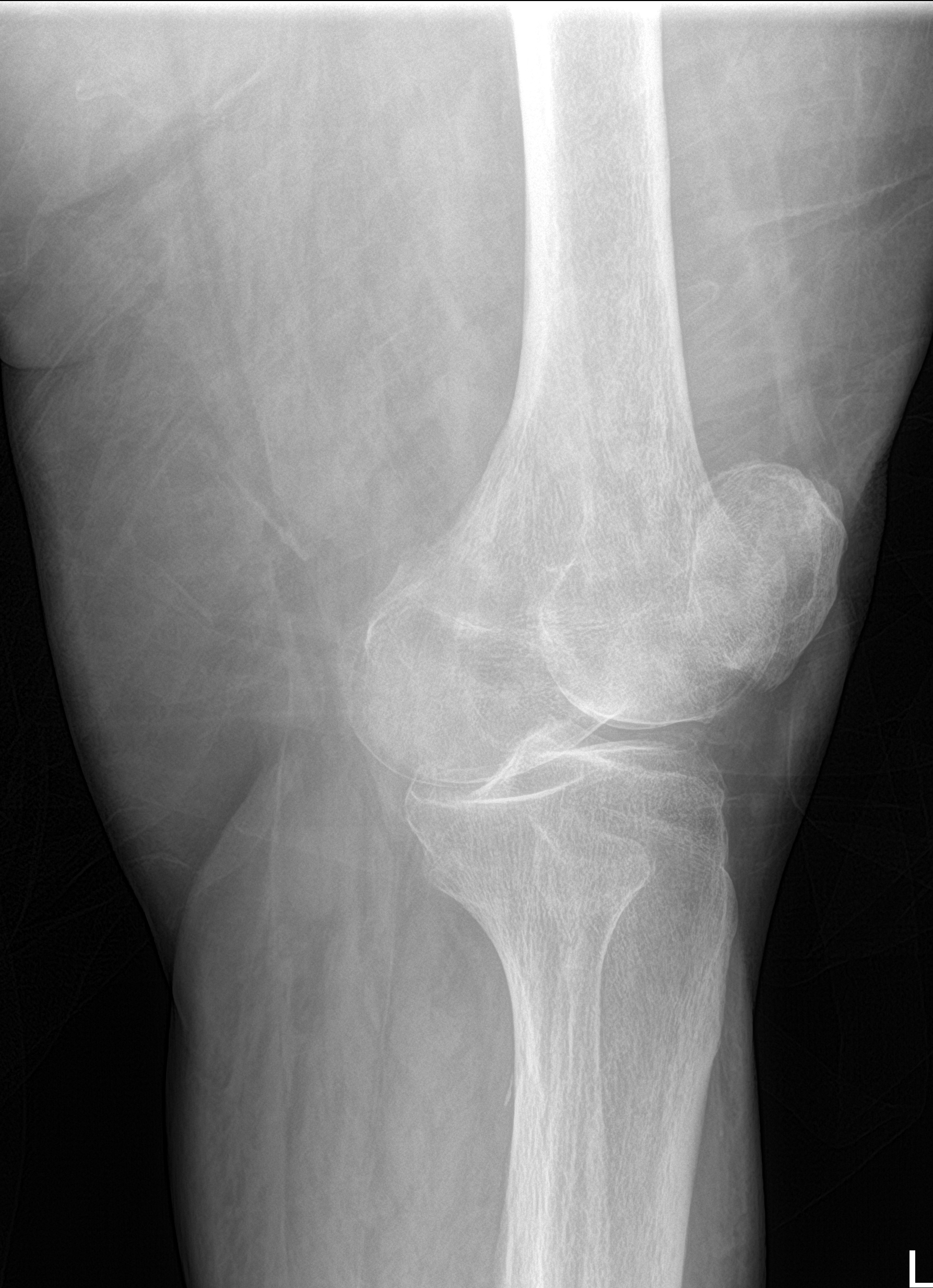
[im 3/4]
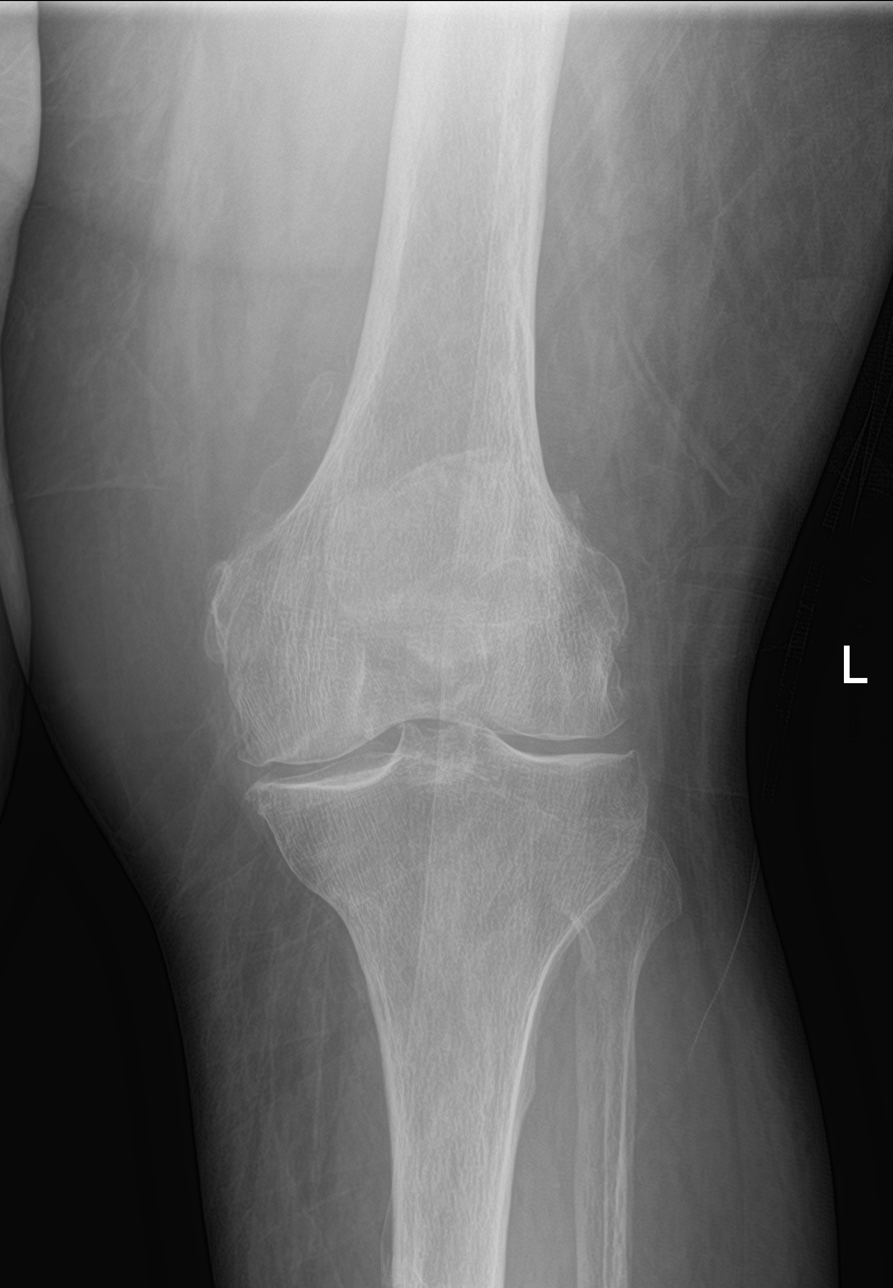
[im 4/4]
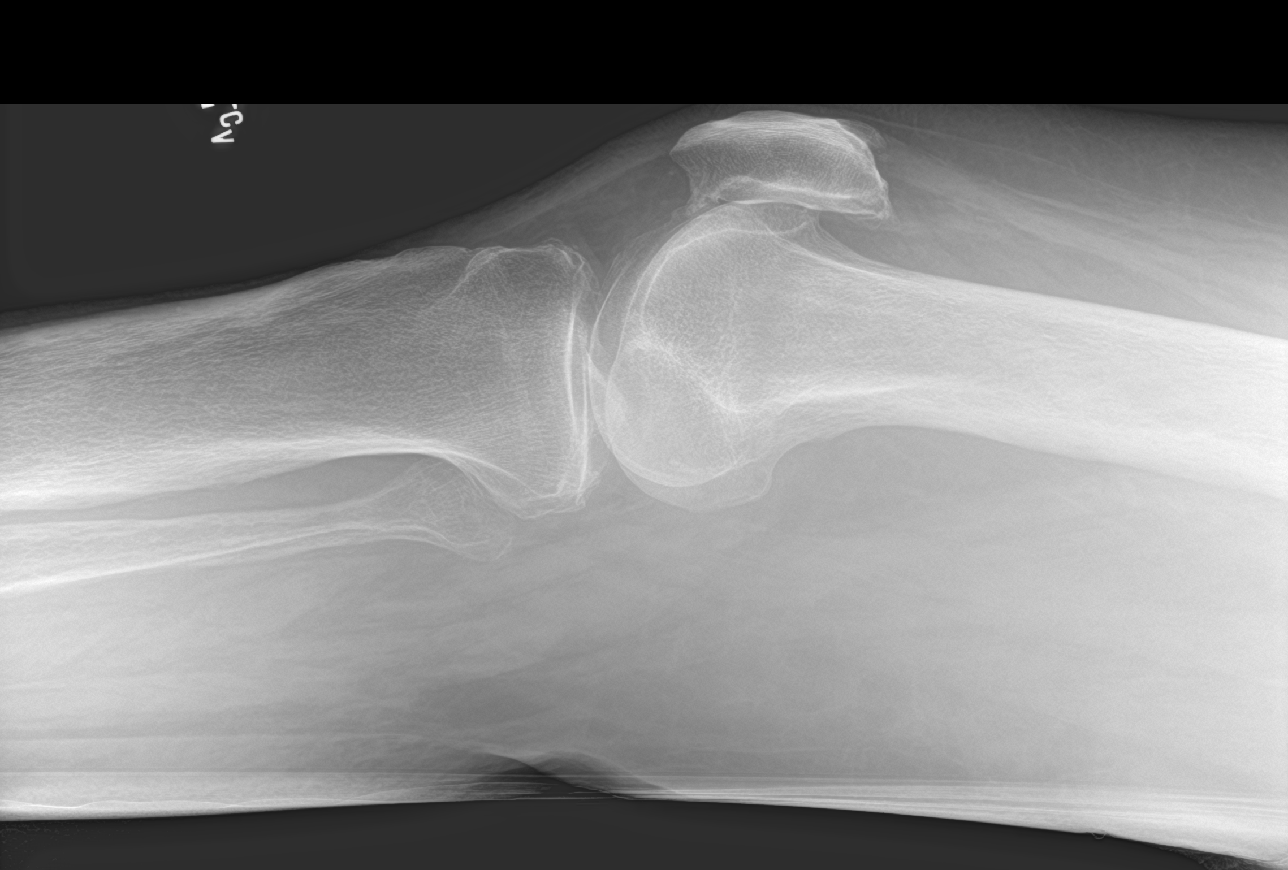

[4 of 4 positions shown; findings below may reference images not displayed]

FINDINGS: Tricompartmental degenerative changes of the left knee are seen. No
joint effusion is noted. No acute fracture or dislocation is seen.
IMPRESSION: Degenerative changes without acute abnormality.

## 2021-05-19 IMAGING — CR DG SHOULDER 2+V*R*
1 series · 2 of 2 positions shown · non-contrast
Comparison: None.

CLINICAL DATA: Fall

EXAM:
RIGHT SHOULDER - 2+ VIEW

[Series 1: dg shoulder right · 0.14mm/px · 2 of 2 slices shown]
[im 1/2]
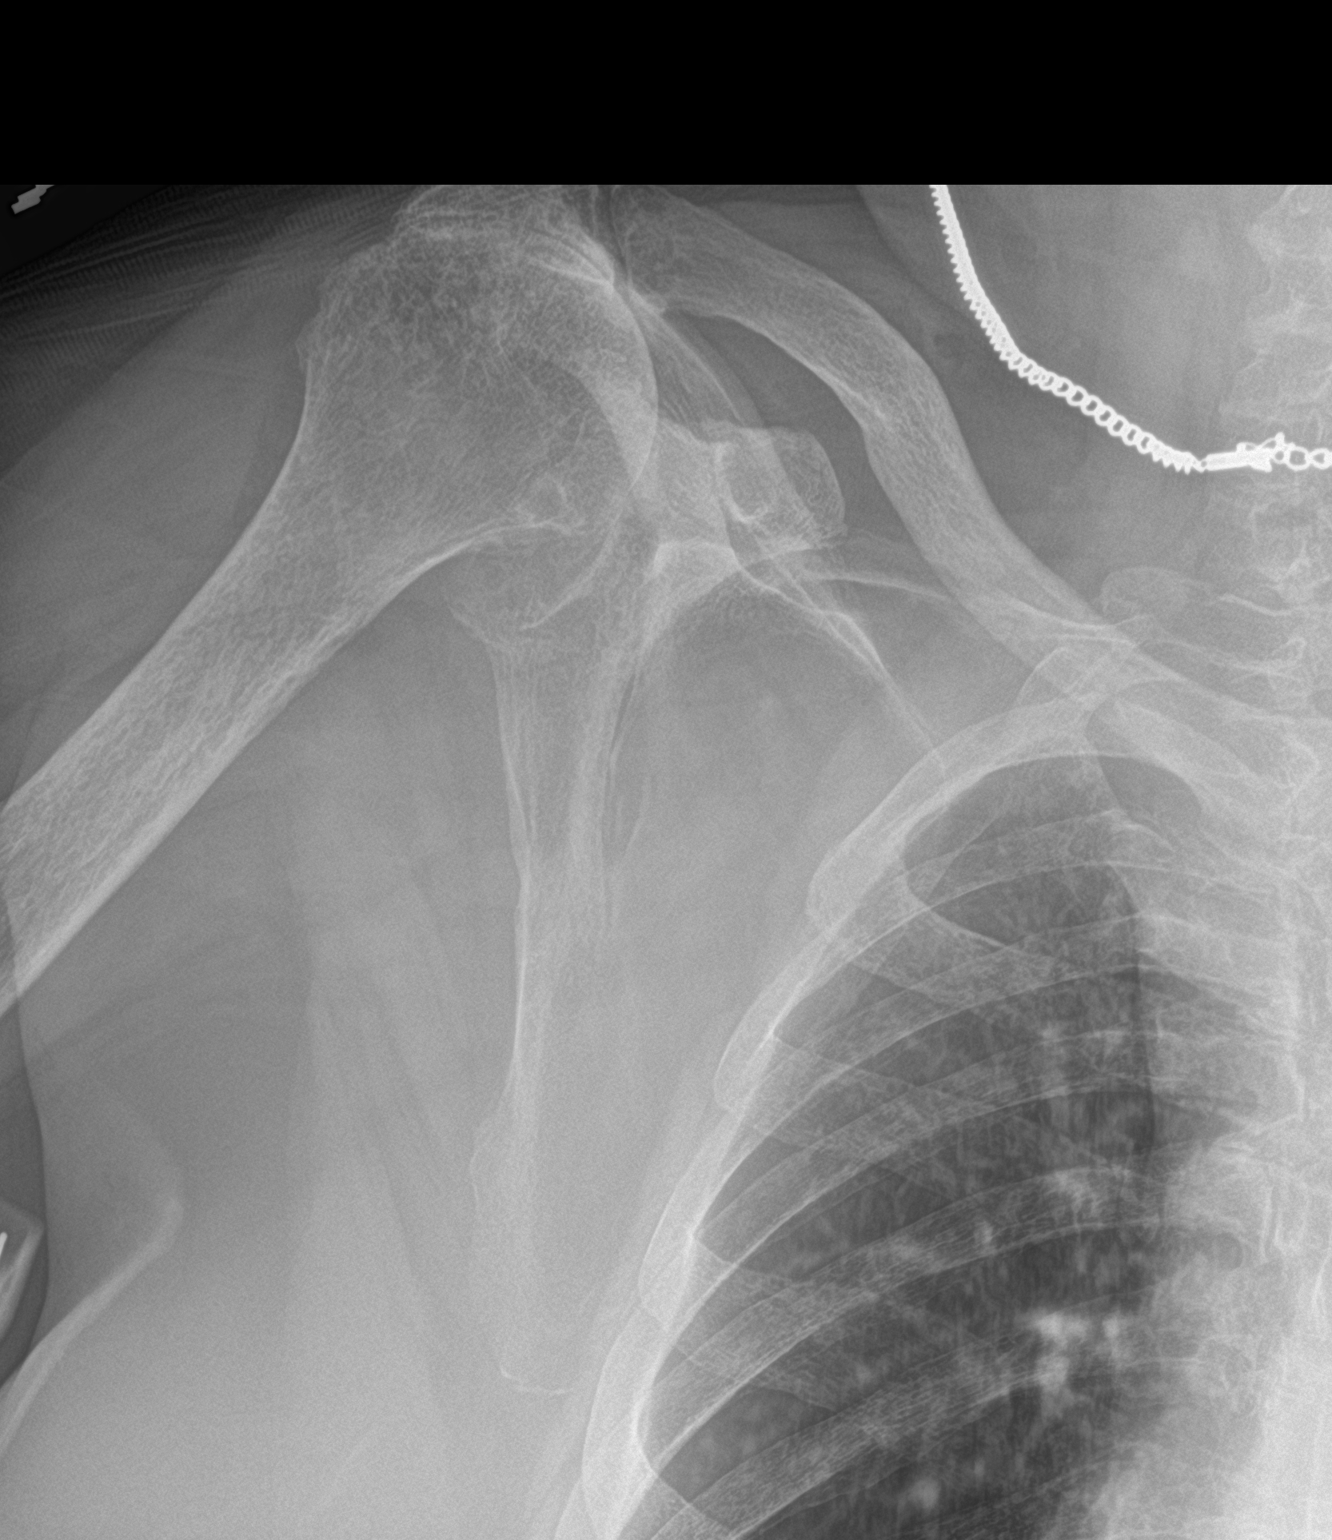
[im 2/2]
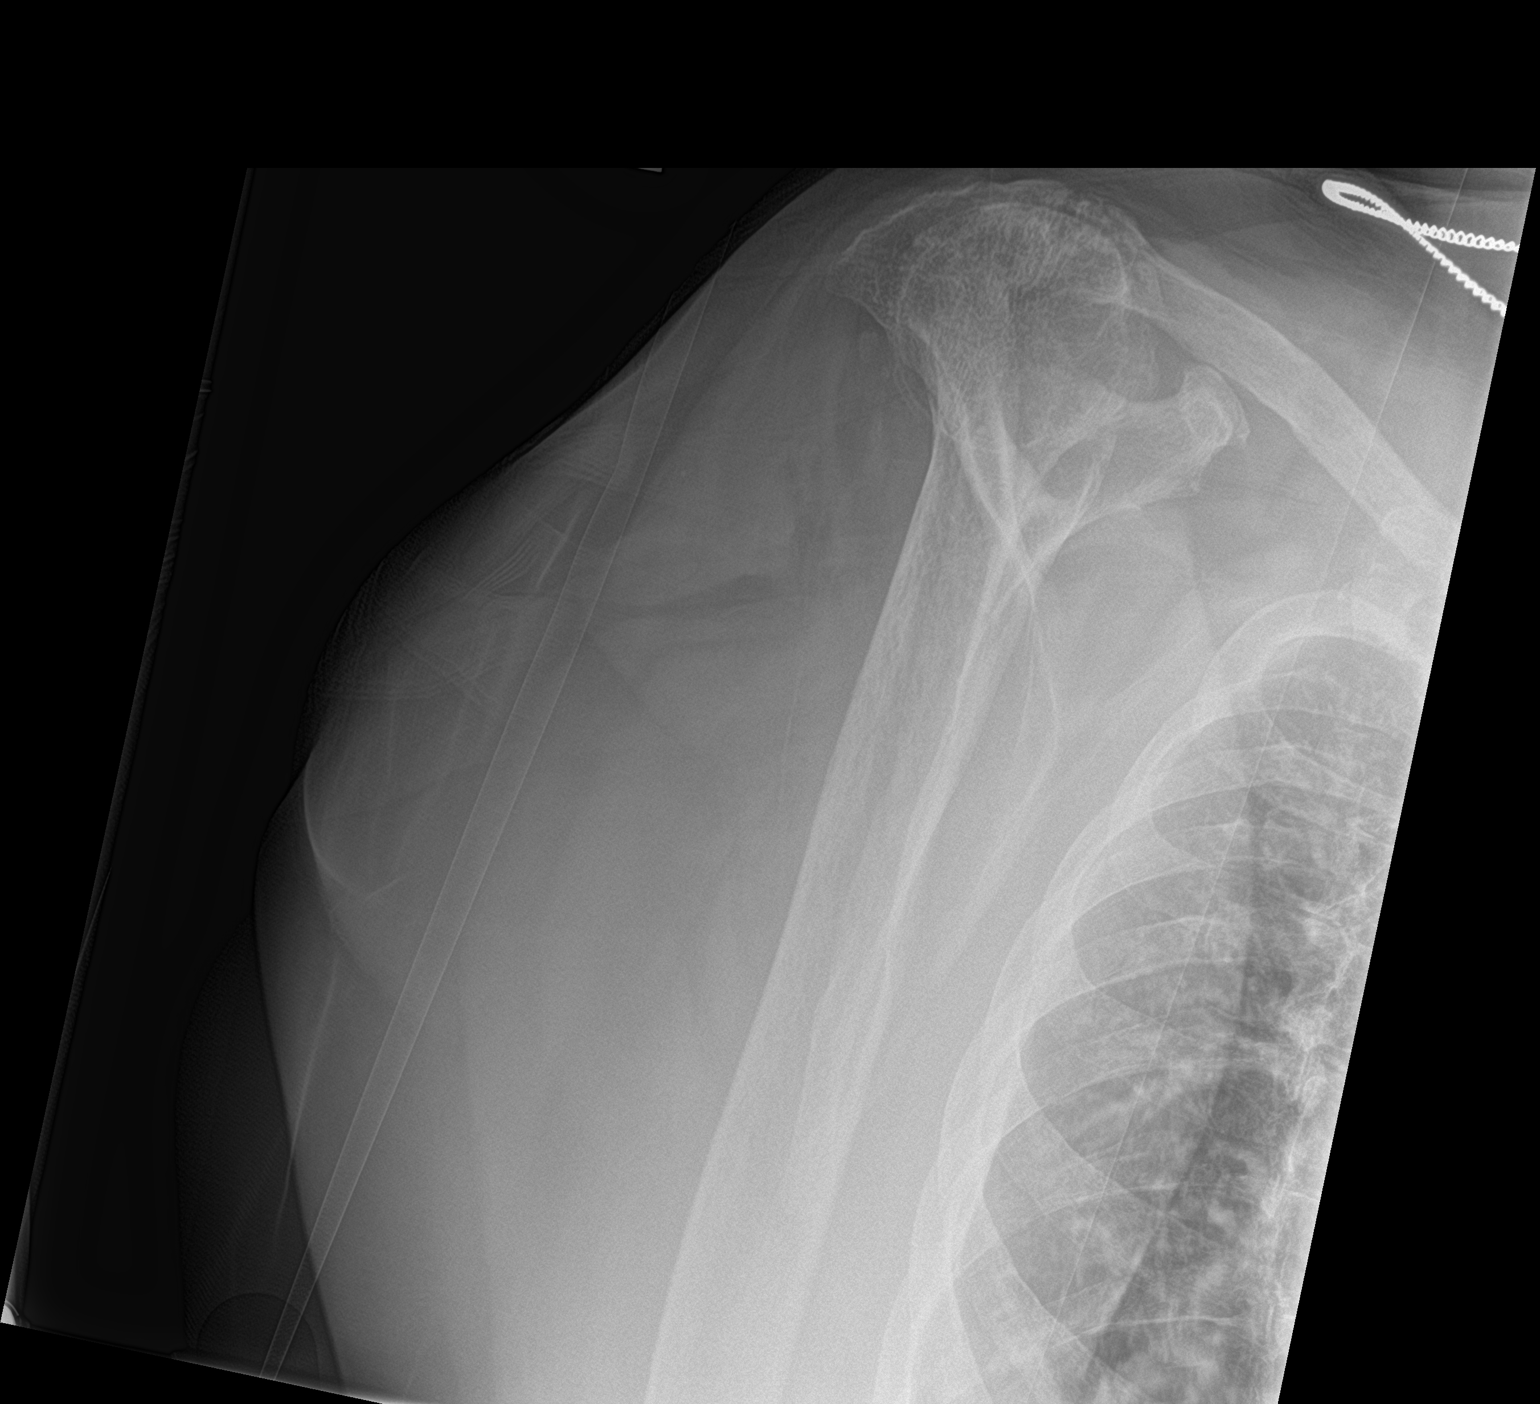

[2 of 2 positions shown; findings below may reference images not displayed]

FINDINGS: Nonstandard positioning. There is superior subluxation of the
glenohumeral joint with the superior aspect of the humeral head
likely contacting the inferior acromion. There is mild
acromioclavicular osteoarthrosis. No fracture.
IMPRESSION: 1. Superior subluxation at the glenohumeral joint, likely contacting
the inferior acromion, possibly chronic.
2. No acute fracture.

## 2021-05-20 ENCOUNTER — Ambulatory Visit: Payer: HMO

## 2021-05-25 ENCOUNTER — Ambulatory Visit: Payer: HMO

## 2021-05-26 DIAGNOSIS — L98419 Non-pressure chronic ulcer of buttock with unspecified severity: Secondary | ICD-10-CM | POA: Diagnosis not present

## 2021-05-27 ENCOUNTER — Ambulatory Visit: Payer: HMO

## 2021-05-28 DIAGNOSIS — M79605 Pain in left leg: Secondary | ICD-10-CM | POA: Diagnosis not present

## 2021-05-28 DIAGNOSIS — M1712 Unilateral primary osteoarthritis, left knee: Secondary | ICD-10-CM | POA: Diagnosis not present

## 2021-06-01 ENCOUNTER — Ambulatory Visit: Payer: HMO

## 2021-06-02 DIAGNOSIS — L97819 Non-pressure chronic ulcer of other part of right lower leg with unspecified severity: Secondary | ICD-10-CM | POA: Diagnosis not present

## 2021-06-03 ENCOUNTER — Ambulatory Visit: Payer: HMO

## 2021-06-07 DIAGNOSIS — F32A Depression, unspecified: Secondary | ICD-10-CM | POA: Diagnosis not present

## 2021-06-07 DIAGNOSIS — F5101 Primary insomnia: Secondary | ICD-10-CM | POA: Diagnosis not present

## 2021-06-08 ENCOUNTER — Ambulatory Visit: Payer: HMO

## 2021-06-08 DIAGNOSIS — H10503 Unspecified blepharoconjunctivitis, bilateral: Secondary | ICD-10-CM | POA: Diagnosis not present

## 2021-06-08 DIAGNOSIS — H26493 Other secondary cataract, bilateral: Secondary | ICD-10-CM | POA: Diagnosis not present

## 2021-06-08 DIAGNOSIS — H02132 Senile ectropion of right lower eyelid: Secondary | ICD-10-CM | POA: Diagnosis not present

## 2021-06-09 DIAGNOSIS — L98419 Non-pressure chronic ulcer of buttock with unspecified severity: Secondary | ICD-10-CM | POA: Diagnosis not present

## 2021-06-09 DIAGNOSIS — S81811D Laceration without foreign body, right lower leg, subsequent encounter: Secondary | ICD-10-CM | POA: Diagnosis not present

## 2021-06-10 ENCOUNTER — Ambulatory Visit: Payer: HMO

## 2021-06-14 DIAGNOSIS — R2689 Other abnormalities of gait and mobility: Secondary | ICD-10-CM | POA: Diagnosis not present

## 2021-06-14 DIAGNOSIS — R279 Unspecified lack of coordination: Secondary | ICD-10-CM | POA: Diagnosis not present

## 2021-06-14 DIAGNOSIS — R1310 Dysphagia, unspecified: Secondary | ICD-10-CM | POA: Diagnosis not present

## 2021-06-14 DIAGNOSIS — M6281 Muscle weakness (generalized): Secondary | ICD-10-CM | POA: Diagnosis not present

## 2021-06-14 DIAGNOSIS — R1314 Dysphagia, pharyngoesophageal phase: Secondary | ICD-10-CM | POA: Diagnosis not present

## 2021-06-15 ENCOUNTER — Ambulatory Visit: Payer: HMO

## 2021-06-16 DIAGNOSIS — L97819 Non-pressure chronic ulcer of other part of right lower leg with unspecified severity: Secondary | ICD-10-CM | POA: Diagnosis not present

## 2021-06-17 ENCOUNTER — Ambulatory Visit: Payer: HMO

## 2021-06-22 ENCOUNTER — Ambulatory Visit: Payer: HMO

## 2021-06-23 DIAGNOSIS — M25511 Pain in right shoulder: Secondary | ICD-10-CM | POA: Diagnosis not present

## 2021-06-23 DIAGNOSIS — L98419 Non-pressure chronic ulcer of buttock with unspecified severity: Secondary | ICD-10-CM | POA: Diagnosis not present

## 2021-06-24 ENCOUNTER — Ambulatory Visit: Payer: HMO

## 2021-06-29 ENCOUNTER — Ambulatory Visit: Payer: HMO

## 2021-06-30 DIAGNOSIS — L98419 Non-pressure chronic ulcer of buttock with unspecified severity: Secondary | ICD-10-CM | POA: Diagnosis not present

## 2021-07-01 ENCOUNTER — Ambulatory Visit: Payer: HMO

## 2021-07-06 ENCOUNTER — Ambulatory Visit: Payer: HMO

## 2021-07-07 DIAGNOSIS — L98419 Non-pressure chronic ulcer of buttock with unspecified severity: Secondary | ICD-10-CM | POA: Diagnosis not present

## 2021-07-08 ENCOUNTER — Ambulatory Visit: Payer: HMO

## 2021-07-13 ENCOUNTER — Ambulatory Visit: Payer: HMO

## 2021-07-14 DIAGNOSIS — J449 Chronic obstructive pulmonary disease, unspecified: Secondary | ICD-10-CM | POA: Diagnosis not present

## 2021-07-14 DIAGNOSIS — L97829 Non-pressure chronic ulcer of other part of left lower leg with unspecified severity: Secondary | ICD-10-CM | POA: Diagnosis not present

## 2021-07-14 DIAGNOSIS — G4733 Obstructive sleep apnea (adult) (pediatric): Secondary | ICD-10-CM | POA: Diagnosis not present

## 2021-07-15 ENCOUNTER — Ambulatory Visit: Payer: HMO

## 2021-07-15 DIAGNOSIS — R279 Unspecified lack of coordination: Secondary | ICD-10-CM | POA: Diagnosis not present

## 2021-07-15 DIAGNOSIS — M6281 Muscle weakness (generalized): Secondary | ICD-10-CM | POA: Diagnosis not present

## 2021-07-15 DIAGNOSIS — R2689 Other abnormalities of gait and mobility: Secondary | ICD-10-CM | POA: Diagnosis not present

## 2021-07-21 ENCOUNTER — Other Ambulatory Visit: Payer: Self-pay

## 2021-07-21 ENCOUNTER — Encounter: Payer: Self-pay | Admitting: Emergency Medicine

## 2021-07-21 ENCOUNTER — Emergency Department: Payer: HMO

## 2021-07-21 ENCOUNTER — Emergency Department
Admission: EM | Admit: 2021-07-21 | Discharge: 2021-07-22 | Disposition: A | Discharge status: Home / Self Care | Payer: HMO | Attending: Emergency Medicine | Admitting: Emergency Medicine

## 2021-07-21 DIAGNOSIS — I130 Hypertensive heart and chronic kidney disease with heart failure and stage 1 through stage 4 chronic kidney disease, or unspecified chronic kidney disease: Secondary | ICD-10-CM | POA: Diagnosis not present

## 2021-07-21 DIAGNOSIS — L7632 Postprocedural hematoma of skin and subcutaneous tissue following other procedure: Secondary | ICD-10-CM | POA: Insufficient documentation

## 2021-07-21 DIAGNOSIS — L97819 Non-pressure chronic ulcer of other part of right lower leg with unspecified severity: Secondary | ICD-10-CM | POA: Diagnosis not present

## 2021-07-21 DIAGNOSIS — Z7901 Long term (current) use of anticoagulants: Secondary | ICD-10-CM | POA: Diagnosis not present

## 2021-07-21 DIAGNOSIS — I509 Heart failure, unspecified: Secondary | ICD-10-CM | POA: Diagnosis not present

## 2021-07-21 DIAGNOSIS — J449 Chronic obstructive pulmonary disease, unspecified: Secondary | ICD-10-CM | POA: Diagnosis not present

## 2021-07-21 DIAGNOSIS — M7989 Other specified soft tissue disorders: Secondary | ICD-10-CM | POA: Insufficient documentation

## 2021-07-21 DIAGNOSIS — W228XXA Striking against or struck by other objects, initial encounter: Secondary | ICD-10-CM | POA: Insufficient documentation

## 2021-07-21 DIAGNOSIS — N189 Chronic kidney disease, unspecified: Secondary | ICD-10-CM | POA: Insufficient documentation

## 2021-07-21 DIAGNOSIS — S8991XA Unspecified injury of right lower leg, initial encounter: Secondary | ICD-10-CM | POA: Diagnosis present

## 2021-07-21 DIAGNOSIS — I4891 Unspecified atrial fibrillation: Secondary | ICD-10-CM | POA: Diagnosis not present

## 2021-07-21 DIAGNOSIS — S81811A Laceration without foreign body, right lower leg, initial encounter: Secondary | ICD-10-CM | POA: Diagnosis not present

## 2021-07-21 DIAGNOSIS — R58 Hemorrhage, not elsewhere classified: Secondary | ICD-10-CM | POA: Diagnosis not present

## 2021-07-21 DIAGNOSIS — S8011XA Contusion of right lower leg, initial encounter: Secondary | ICD-10-CM

## 2021-07-21 DIAGNOSIS — S81819A Laceration without foreign body, unspecified lower leg, initial encounter: Secondary | ICD-10-CM

## 2021-07-21 DIAGNOSIS — Z9889 Other specified postprocedural states: Secondary | ICD-10-CM

## 2021-07-21 LAB — COMPREHENSIVE METABOLIC PANEL
ALT: 13 U/L (ref 0–44)
AST: 19 U/L (ref 15–41)
Albumin: 2.9 g/dL — ABNORMAL LOW (ref 3.5–5.0)
Alkaline Phosphatase: 50 U/L (ref 38–126)
Anion gap: 9 (ref 5–15)
BUN: 20 mg/dL (ref 8–23)
CO2: 24 mmol/L (ref 22–32)
Calcium: 8.6 mg/dL — ABNORMAL LOW (ref 8.9–10.3)
Chloride: 107 mmol/L (ref 98–111)
Creatinine, Ser: 1.13 mg/dL (ref 0.61–1.24)
GFR, Estimated: >60 mL/min (ref >60)
Glucose, Bld: 119 mg/dL — ABNORMAL HIGH (ref 70–99)
Potassium: 4.3 mmol/L (ref 3.5–5.1)
Sodium: 140 mmol/L (ref 135–145)
Total Bilirubin: 0.6 mg/dL (ref 0.3–1.2)
Total Protein: 5.9 g/dL — ABNORMAL LOW (ref 6.5–8.1)

## 2021-07-21 LAB — CBC WITH DIFFERENTIAL/PLATELET
Abs Immature Granulocytes: 0.06 10*3/uL (ref 0.00–0.07)
Basophils Absolute: 0.1 10*3/uL (ref 0.0–0.1)
Basophils Relative: 1 %
Eosinophils Absolute: 0.1 10*3/uL (ref 0.0–0.5)
Eosinophils Relative: 1 %
HCT: 30.1 % — ABNORMAL LOW (ref 39.0–52.0)
Hemoglobin: 8.5 g/dL — ABNORMAL LOW (ref 13.0–17.0)
Immature Granulocytes: 1 %
Lymphocytes Relative: 16 %
Lymphs Abs: 1.7 10*3/uL (ref 0.7–4.0)
MCH: 25.1 pg — ABNORMAL LOW (ref 26.0–34.0)
MCHC: 28.2 g/dL — ABNORMAL LOW (ref 30.0–36.0)
MCV: 88.8 fL (ref 80.0–100.0)
Monocytes Absolute: 0.8 10*3/uL (ref 0.1–1.0)
Monocytes Relative: 8 %
Neutro Abs: 7.9 10*3/uL — ABNORMAL HIGH (ref 1.7–7.7)
Neutrophils Relative %: 73 %
Platelets: 345 10*3/uL (ref 150–400)
RBC: 3.39 MIL/uL — ABNORMAL LOW (ref 4.22–5.81)
RDW: 20.6 % — ABNORMAL HIGH (ref 11.5–15.5)
WBC: 10.6 10*3/uL — ABNORMAL HIGH (ref 4.0–10.5)
nRBC: 0.2 % (ref 0.0–0.2)

## 2021-07-21 MED ORDER — LIDOCAINE-EPINEPHRINE (PF) 2 %-1:200000 IJ SOLN
20.0000 mL | Freq: Once | INTRAMUSCULAR | Status: AC
  Administered 2021-07-21: 20 mL via INTRADERMAL
  Filled 2021-07-21: qty 20

## 2021-07-21 MED ORDER — OXYCODONE-ACETAMINOPHEN 5-325 MG PO TABS
1.0000 | ORAL_TABLET | Freq: Once | ORAL | Status: AC
  Administered 2021-07-21: 1 via ORAL
  Filled 2021-07-21: qty 1

## 2021-07-21 NOTE — ED Provider Notes (Signed)
[facility] Emergency Department Provider Note     Event Date/Time   First MD Initiated Contact with Patient 07/21/21 1841     (approximate)   History   Leg Injury   HPI  Daniel Eaton. is a 81 y.o. male with a history of COPD, A-fib on Eliquis, hypertension, CHF, and CKD, presents to the ED from [facility].  Patient presents via EMS from his facility, with reports of a mechanical injury to his medial right leg.  He apparently hit his leg on a 6 to stand machine in 2 days ago creating a large hematoma and bruise.  Patient has noted some intermittent spontaneous bleeding from the hematoma.  He presents with a dressing in place with bleeding controlled.  Denies any other injury at this time.  He denies any fevers, chills, chest pain, or shortness of breath.  Physical Exam   Triage Vital Signs: ED Triage Vitals  Enc Vitals Group     BP 07/21/21 1843 (!) 109/49     Pulse Rate 07/21/21 1843 93     Resp 07/21/21 1843 18     Temp 07/21/21 1843 97.7 F (36.5 C)     Temp Source 07/21/21 1843 Oral     SpO2 07/21/21 1840 97 %     Weight 07/21/21 1840 252 lb (114.3 kg)     Height --      Head Circumference --      Peak Flow --      Pain Score 07/21/21 1840 10     Pain Loc --      Pain Edu? --      Excl. in [place]? --     Most recent vital signs: Vitals:   07/21/21 1843 07/22/21 0023  BP: (!) 109/49 137/86  Pulse: 93 (!) 102  Resp: 18 20  Temp: 97.7 F (36.5 C) (!) 97.5 F (36.4 C)  SpO2: 99% 99%    General Awake, no distress. NAD HEENT NCAT. PERRL. EOMI. No rhinorrhea. Mucous membranes are moist.  CV:  Good peripheral perfusion.  RESP:  Normal effort.  ABD:  No distention.  MSK:  RLE with a large superficial hematoma noted to the medial right leg from the tibial plateau to the distal third of the shin.  The lower leg has a skin tear measuring approximately 4 cm with the emerging hematoma.   ED Results / Procedures / Treatments    Labs (all labs ordered are listed, but only abnormal results are displayed) Labs Reviewed  COMPREHENSIVE METABOLIC PANEL - Abnormal; Notable for the following components:      Result Value   Glucose, Bld 119 (*)    Calcium 8.6 (*)    Total Protein 5.9 (*)    Albumin 2.9 (*)    All other components within normal limits  CBC WITH DIFFERENTIAL/PLATELET - Abnormal; Notable for the following components:   WBC 10.6 (*)    RBC 3.39 (*)    Hemoglobin 8.5 (*)    HCT 30.1 (*)    MCH 25.1 (*)    MCHC 28.2 (*)    RDW 20.6 (*)    Neutro Abs 7.9 (*)    All other components within normal limits     EKG   RADIOLOGY  I personally viewed and evaluated these images as part of my medical decision making, as well as reviewing the written report by the radiologist.  ED Provider Interpretation: hematoma noted. No DVT}  [place] RLE  Venous / Soft Tissue  IMPRESSION: 1. No evidence of DVT in the visualized right lower extremity veins. 2. Large soft tissue hematoma.    PROCEDURES:  Critical Care performed: No  ..Laceration Repair  Date/Time: 07/22/2021 10:42 AM  Performed by: Myra Rude, Student-PA Authorized by: Melvenia Needles, PA-C   Consent:    Consent obtained:  Verbal   Consent given by:  Parent   Risks discussed:  Pain and poor wound healing   Alternatives discussed:  No treatment Universal protocol:    Test results available: yes     Imaging studies available: yes     Site/side marked: yes     Patient identity confirmed:  Verbally with patient Anesthesia:    Anesthesia method:  Local infiltration   Local anesthetic:  Lidocaine 2% WITH epi Laceration details:    Location:  Leg   Leg location:  R lower leg   Length (cm):  15   Depth (mm):  5 Pre-procedure details:    Preparation:  Patient was prepped and draped in usual sterile fashion Exploration:    Limited defect created (wound extended): yes     Hemostasis achieved with:  Epinephrine and direct pressure    Wound exploration: entire depth of wound visualized     Contaminated: no   Treatment:    Area cleansed with:  Saline and povidone-iodine   Amount of cleaning:  Standard   Irrigation solution:  Sterile saline   Irrigation volume:  20   Irrigation method:  Syringe   Debridement:  Moderate (of hematoma)   Scar revision: no   Skin repair:    Repair method:  Sutures and Steri-Strips   Suture size:  4-0   Suture material:  Nylon   Suture technique:  Simple interrupted   Number of sutures:  15   Number of Steri-Strips:  6 Approximation:    Approximation:  Close Repair type:    Repair type:  Intermediate Post-procedure details:    Dressing:  Bulky dressing and non-adherent dressing   Procedure completion:  Tolerated well, no immediate complications Comments:     Large skin tear was repaired after evacuation of large hematoma to the right leg.  Sutures were used along the edge of the skin tear to GERD up the skin edge for suture repair.  Sutures were utilized to approximate the wounds edges.  Good wound edge approximation is achieved.  A bulky pressure dressing is applied.    MEDICATIONS ORDERED IN ED: Medications  oxyCODONE-acetaminophen (PERCOCET/ROXICET) 5-325 MG per tablet 1 tablet (1 tablet Oral Given 07/21/21 2028)  lidocaine-EPINEPHrine (XYLOCAINE W/EPI) 2 %-1:200000 (PF) injection 20 mL (20 mLs Intradermal Given 07/21/21 2229)  oxyCODONE (Oxy IR/ROXICODONE) immediate release tablet 5 mg (5 mg Oral Given 07/22/21 0137)     IMPRESSION / MDM / ASSESSMENT AND PLAN / ED COURSE  I reviewed the triage vital signs and the nursing notes.                              Differential diagnosis includes, but is not limited to, contusion, hematoma, DVT, cellulitis skin tear  Patient's presentation is most consistent with acute complicated illness / injury requiring diagnostic workup.  Patient's diagnosis is consistent with large hematoma. Patient will be discharged home with prescriptions for  hydrocodone and wound care instructions and supplies. Patient is to follow up with primary provider as needed or otherwise directed. Patient is given ED precautions to return  to the ED for any worsening or new symptoms.     FINAL CLINICAL IMPRESSION(S) / ED DIAGNOSES   Final diagnoses:  Hematoma of leg, right, initial encounter  S/P evacuation of hematoma  Skin tear of lower leg without complication, initial encounter     Rx / DC Orders   ED Discharge Orders          Ordered    HYDROcodone-acetaminophen (NORCO) 5-325 MG tablet  3 times daily PRN        07/22/21 0041             Note:  This document was prepared using Dragon voice recognition software and may include unintentional dictation errors.    Melvenia Needles, PA-C 07/24/21 2325    Vanessa Camp Swift, MD 07/25/21 [phone]

## 2021-07-21 NOTE — ED Triage Notes (Signed)
Patient to ED via [facility] from [facility] for a right leg injury. Patient hit leg on stand to sit machine 2 days ago which created a bruise that has increased in size. Per patient, the bruise "kept swelling then ruptured." Bleeding controlled at this time.

## 2021-07-21 NOTE — ED Notes (Signed)
Pt brief changed.  

## 2021-07-22 DIAGNOSIS — Z743 Need for continuous supervision: Secondary | ICD-10-CM | POA: Diagnosis not present

## 2021-07-22 DIAGNOSIS — S81811A Laceration without foreign body, right lower leg, initial encounter: Secondary | ICD-10-CM | POA: Diagnosis not present

## 2021-07-22 DIAGNOSIS — I10 Essential (primary) hypertension: Secondary | ICD-10-CM | POA: Diagnosis not present

## 2021-07-22 MED ORDER — OXYCODONE HCL 5 MG PO TABS
5.0000 mg | ORAL_TABLET | Freq: Once | ORAL | Status: AC
  Administered 2021-07-22: 5 mg via ORAL
  Filled 2021-07-22: qty 1

## 2021-07-22 MED ORDER — HYDROCODONE-ACETAMINOPHEN 5-325 MG PO TABS: 1.0000 | ORAL_TABLET | Freq: Three times a day (TID) | ORAL | 0 refills | Status: AC | PRN

## 2021-07-22 NOTE — Discharge Instructions (Signed)
Keep the wound clean, dry, and covered. Keep the pressure bandage in place to reduce recollection of the hematoma. Rest with the legs elevated. Take the pain medicine as needed. See your provider for suture removal in 10-12 days.

## 2021-07-26 ENCOUNTER — Ambulatory Visit: Payer: Self-pay

## 2021-07-26 NOTE — Telephone Encounter (Signed)
  Chief Complaint: Question regarding pt discharge reccomendations Symptoms:  Frequency:  Pertinent Negatives: Patient denies  Disposition: '[]'$ ED /'[]'$ Urgent Care (no appt availability in office) / '[]'$ Appointment(In office/virtual)/ '[]'$  [facility]/ '[]'$ Home Care/ '[]'$ Refused Recommended Disposition /'[]'$ [facility]/ '[x]'$  Follow-up with PCP Additional Notes: Kathlee Nations from [employer] needs to know if pt is able to participate in PT or whether he should wait until sutures are removed.  Reason for Disposition  [1] Caller requesting NON-URGENT health information AND [2] PCP's office is the best resource  Answer Assessment - Initial Assessment Questions 1. REASON FOR CALL or QUESTION: "What is your reason for calling today?" or "How can I best help you?" or "What question do you have that I can help answer?"     Pt calling to see if pt can use legs after recent ED visit.  Protocols used: Information Only Call - No Triage-A-AH

## 2021-07-28 DIAGNOSIS — L97819 Non-pressure chronic ulcer of other part of right lower leg with unspecified severity: Secondary | ICD-10-CM | POA: Diagnosis not present

## 2021-07-30 ENCOUNTER — Encounter: Payer: HMO | Attending: Physician Assistant | Admitting: Physician Assistant

## 2021-07-30 DIAGNOSIS — S8011XA Contusion of right lower leg, initial encounter: Secondary | ICD-10-CM | POA: Diagnosis not present

## 2021-07-30 DIAGNOSIS — I480 Paroxysmal atrial fibrillation: Secondary | ICD-10-CM | POA: Insufficient documentation

## 2021-07-30 DIAGNOSIS — G4739 Other sleep apnea: Secondary | ICD-10-CM | POA: Diagnosis not present

## 2021-07-30 DIAGNOSIS — J449 Chronic obstructive pulmonary disease, unspecified: Secondary | ICD-10-CM | POA: Diagnosis not present

## 2021-07-30 DIAGNOSIS — I5042 Chronic combined systolic (congestive) and diastolic (congestive) heart failure: Secondary | ICD-10-CM | POA: Diagnosis not present

## 2021-07-30 DIAGNOSIS — X58XXXA Exposure to other specified factors, initial encounter: Secondary | ICD-10-CM | POA: Diagnosis not present

## 2021-07-30 DIAGNOSIS — Z87891 Personal history of nicotine dependence: Secondary | ICD-10-CM | POA: Diagnosis not present

## 2021-07-30 DIAGNOSIS — Z7901 Long term (current) use of anticoagulants: Secondary | ICD-10-CM | POA: Diagnosis not present

## 2021-07-30 DIAGNOSIS — I498 Other specified cardiac arrhythmias: Secondary | ICD-10-CM | POA: Diagnosis not present

## 2021-07-30 DIAGNOSIS — L97812 Non-pressure chronic ulcer of other part of right lower leg with fat layer exposed: Secondary | ICD-10-CM | POA: Diagnosis not present

## 2021-07-30 DIAGNOSIS — I2510 Atherosclerotic heart disease of native coronary artery without angina pectoris: Secondary | ICD-10-CM | POA: Diagnosis not present

## 2021-08-03 NOTE — Progress Notes (Signed)
Giglia, Andrzej L. ([id number]) Visit Report for 07/30/2021 Allergy List Details Patient Name: Daniel Eaton, Daniel Eaton. Date of Service: 07/30/2021 9:30 AM Medical Record Number: [id number] Patient Account Number: [id number] Date of Birth/Sex: 04-30-1940 (81 y.o. M) Treating RN: Cornell Barman Primary Care Kearra Calkin: Frazier Richards Other Clinician: Referring Aspasia Rude: Frazier Richards Treating Haleigh Desmith/Extender: Skipper Cliche in Treatment: 0 Allergies Active Allergies Dilantin Allergy Notes Electronic Signature(s) Signed: 08/03/2021 12:21:47 PM By: Gretta Cool, BSN, RN, CWS, Kim RN, BSN Entered By: Gretta Cool, BSN, RN, CWS, Kim on 07/30/2021 09:50:36 Rennis Golden, Tawan L. ([id number]) -------------------------------------------------------------------------------- Arrival Information Details Patient Name: Daniel Eaton, Daniel Eaton. Date of Service: 07/30/2021 9:30 AM Medical Record Number: [id number] Patient Account Number: [id number] Date of Birth/Sex: May 30, 1940 (81 y.o. M) Treating RN: Cornell Barman Primary Care Makhya Arave: Frazier Richards Other Clinician: Referring Jamesen Stahnke: Frazier Richards Treating Aanika Defoor/Extender: Skipper Cliche in Treatment: 0 Visit Information Patient Arrived: Wheel Chair Arrival Time: 09:47 Accompanied By: caregiver and ex-wife Transfer Assistance: Other Patient Identification Verified: Yes Secondary Verification Process Completed: Yes Patient Has Alerts: Yes Patient Alerts: Patient on Blood Thinner eliquis NOT Diabetic Notes Patient remained in chair during the visit. Electronic Signature(s) Signed: 08/03/2021 12:21:47 PM By: Gretta Cool, BSN, RN, CWS, Kim RN, BSN Entered By: Gretta Cool, BSN, RN, CWS, Kim on 07/30/2021 09:49:15 Rennis Golden, Carlson L. ([id number]) -------------------------------------------------------------------------------- Clinic Level of Care Assessment Details Patient Name: Daniel Eaton, Daniel Eaton. Date of Service: 07/30/2021 9:30 AM Medical Record Number: [id number] Patient Account  Number: [id number] Date of Birth/Sex: December 02, 1940 (81 y.o. M) Treating RN: Cornell Barman Primary Care Aadvik Roker: Frazier Richards Other Clinician: Referring Kiona Blume: Frazier Richards Treating Param Capri/Extender: Skipper Cliche in Treatment: 0 Clinic Level of Care Assessment Items TOOL 1 Quantity Score []  - Use when EandM and Procedure is performed on INITIAL visit 0 ASSESSMENTS - Nursing Assessment / Reassessment X - General Physical Exam (combine w/ comprehensive assessment (listed just below) when performed on new 1 20 pt. evals) X- 1 25 Comprehensive Assessment (HX, ROS, Risk Assessments, Wounds Hx, etc.) ASSESSMENTS - Wound and Skin Assessment / Reassessment []  - Dermatologic / Skin Assessment (not related to wound area) 0 ASSESSMENTS - Ostomy and/or Continence Assessment and Care []  - Incontinence Assessment and Management 0 []  - 0 Ostomy Care Assessment and Management (repouching, etc.) PROCESS - Coordination of Care []  - Simple Patient / Family Education for ongoing care 0 X- 1 20 Complex (extensive) Patient / Family Education for ongoing care X- 1 10 Staff obtains Consents, Records, Test Results / Process Orders []  - 0 Staff telephones HHA, Nursing Homes / Clarify orders / etc []  - 0 Routine Transfer to another Facility (non-emergent condition) []  - 0 Routine Hospital Admission (non-emergent condition) X- 1 15 New Admissions / Biomedical engineer / Ordering NPWT, Apligraf, etc. []  - 0 Emergency Hospital Admission (emergent condition) PROCESS - Special Needs []  - Pediatric / Minor Patient Management 0 []  - 0 Isolation Patient Management []  - 0 Hearing / Language / Visual special needs []  - 0 Assessment of Community assistance (transportation, D/C planning, etc.) []  - 0 Additional assistance / Altered mentation []  - 0 Support Surface(s) Assessment (bed, cushion, seat, etc.) INTERVENTIONS - Miscellaneous []  - External ear exam 0 []  - 0 Patient Transfer  (multiple staff / Civil Service fast streamer / Similar devices) []  - 0 Simple Staple / Suture removal (25 or less) []  - 0 Complex Staple / Suture removal (26 or more) []  - 0 Hypo/Hyperglycemic Management (do not check if billed separately) []  - 0 Ankle / Brachial Index (ABI) - do  not check if billed separately Has the patient been seen at the hospital within the last three years: Yes Total Score: 90 Level Of Care: New/Established - Level 3 Lambe, Kollyn L. ([id number]) Electronic Signature(s) Signed: 08/03/2021 12:21:47 PM By: Gretta Cool, BSN, RN, CWS, Kim RN, BSN Entered By: Gretta Cool, BSN, RN, CWS, Kim on 07/30/2021 10:56:54 Rennis Golden, Colleen L. ([id number]) -------------------------------------------------------------------------------- Encounter Discharge Information Details Patient Name: Daniel Eaton, Daniel Eaton. Date of Service: 07/30/2021 9:30 AM Medical Record Number: [id number] Patient Account Number: [id number] Date of Birth/Sex: 21-Jul-1940 (81 y.o. M) Treating RN: Cornell Barman Primary Care Amari Burnsworth: Frazier Richards Other Clinician: Referring Felma Pfefferle: Frazier Richards Treating Makesha Belitz/Extender: Skipper Cliche in Treatment: 0 Encounter Discharge Information Items Post Procedure Vitals Discharge Condition: Stable Temperature (F): 97.6 Ambulatory Status: Ambulatory Pulse (bpm): 80 Discharge Destination: Home Respiratory Rate (breaths/min): 18 Transportation: Private Auto Blood Pressure (mmHg): 117/86 Accompanied By: caregiver and ex-wife Schedule Follow-up Appointment: Yes Clinical Summary of Care: Electronic Signature(s) Signed: 08/03/2021 12:21:47 PM By: Gretta Cool, BSN, RN, CWS, Kim RN, BSN Entered By: Gretta Cool, BSN, RN, CWS, Kim on 07/30/2021 10:58:57 Kovacic, Ruford L. ([id number]) -------------------------------------------------------------------------------- Lower Extremity Assessment Details Patient Name: Daniel Eaton, Daniel Eaton. Date of Service: 07/30/2021 9:30 AM Medical Record Number: [id number] Patient  Account Number: [id number] Date of Birth/Sex: 1940-04-13 (81 y.o. M) Treating RN: Cornell Barman Primary Care [place] Warriner: Frazier Richards Other Clinician: Referring Seraphim Affinito: Frazier Richards Treating Euriah Matlack/Extender: Skipper Cliche in Treatment: 0 Vascular Assessment Pulses: Dorsalis Pedis Palpable: [Right:Yes] Posterior Tibial Palpable: [Right:Yes] Notes Unable to do ABI due to location and size of the wound. Electronic Signature(s) Signed: 08/03/2021 12:21:47 PM By: Gretta Cool, BSN, RN, CWS, Kim RN, BSN Entered By: Gretta Cool, BSN, RN, CWS, Kim on 07/30/2021 10:13:48 Rennis Golden, [place] ([id number]) -------------------------------------------------------------------------------- Multi Wound Chart Details Patient Name: Daniel Eaton, Daniel Eaton. Date of Service: 07/30/2021 9:30 AM Medical Record Number: [id number] Patient Account Number: [id number] Date of Birth/Sex: 1940-09-16 (81 y.o. M) Treating RN: Cornell Barman Primary Care Ichelle Harral: Frazier Richards Other Clinician: Referring Cyril Railey: Frazier Richards Treating Sagan Wurzel/Extender: Skipper Cliche in Treatment: 0 Vital Signs Height(in): 62 Pulse(bpm): 32 Weight(lbs): 47 Blood Pressure(mmHg): 117/72 Body Mass Index(BMI): 35.8 Temperature(F): 97.6 Respiratory Rate(breaths/min): 16 Photos: [N/A:N/A] Wound Location: Right, Medial Lower Leg N/A N/A Wounding Event: Trauma N/A N/A Primary Etiology: Trauma, Other N/A N/A Comorbid History: Chronic Obstructive Pulmonary N/A N/A Disease (COPD), Sleep Apnea, Arrhythmia, Osteoarthritis Date Acquired: 06/04/2021 N/A N/A Weeks of Treatment: 0 N/A N/A Wound Status: Open N/A N/A Wound Recurrence: No N/A N/A Measurements L x W x D (cm) 20x10x0.7 N/A N/A Area (cm) : 157.08 N/A N/A Volume (cm) : 109.956 N/A N/A % Reduction in Area: 0.00% N/A N/A % Reduction in Volume: 0.00% N/A N/A Starting Position 1 (o'clock): 9 Ending Position 1 (o'clock): 2 Maximum Distance 1 (cm): 2.4 Undermining: Yes N/A  N/A Classification: Full Thickness Without Exposed N/A N/A Support Structures Exudate Amount: Large N/A N/A Exudate Type: Serosanguineous N/A N/A Exudate Color: red, brown N/A N/A Wound Margin: Thickened N/A N/A Necrotic Amount: N/A N/A N/A Necrotic Tissue: Eschar N/A N/A Exposed Structures: Fat Layer (Subcutaneous Tissue): N/A N/A Yes Fascia: No Tendon: No Muscle: No Joint: No Bone: No Epithelialization: None N/A N/A Treatment Notes Electronic Signature(s) Signed: 08/03/2021 12:21:47 PM By: Gretta Cool, BSN, RN, CWS, Kim RN, BSN Howell, Kamrar ([id number]) Entered By: Gretta Cool, BSN, RN, CWS, Kim on 07/30/2021 10:32:32 Rennis Golden, Claude L. ([id number]) -------------------------------------------------------------------------------- [place] Details Patient Name: Daniel Eaton, Daniel Eaton. Date of Service: 07/30/2021 9:30 AM Medical Record Number: [id number] Patient Account  Number: [id number] Date of Birth/Sex: 04/10/1940 (81 y.o. M) Treating RN: Cornell Barman Primary Care Babette Stum: Frazier Richards Other Clinician: Referring Michole Lecuyer: Frazier Richards Treating Jasper Hanf/Extender: Skipper Cliche in Treatment: 0 Active Inactive Abuse / Safety / Falls / Self Care Management Nursing Diagnoses: Abuse or neglect; actual or potential Impaired physical mobility Potential for falls Potential for injury related to falls Goals: Patient will not experience any injury related to falls Date Initiated: 07/30/2021 Target Resolution Date: 07/30/2021 Goal Status: Active Patient/caregiver will verbalize understanding of skin care regimen Date Initiated: 07/30/2021 Target Resolution Date: 07/30/2021 Goal Status: Active Interventions: Assess fall risk on admission and as needed Notes: Medication Nursing Diagnoses: Knowledge deficit related to medication safety: actual or potential Goals: Patient/caregiver will demonstrate understanding of all current medications Date Initiated:  07/30/2021 Target Resolution Date: 07/30/2021 Goal Status: Active Patient/caregiver will demonstrate understanding of new oral/IV medications prescribed at the [facility] (topical prescriptions are covered under the skin breakdown problem) Date Initiated: 07/30/2021 Target Resolution Date: 07/30/2021 Goal Status: Active Interventions: Assess for medication contraindications each visit where new medications are prescribed Assess patient/caregiver ability to manage medication regimen upon admission and as needed Patient/Caregiver given reconciled medication list upon admission, changes in medications and discharge from the [facility] education on medication safety Treatment Activities: New medication prescribed at [facility] : 07/30/2021 Notes: Necrotic Tissue Nursing Diagnoses: Impaired tissue integrity related to necrotic/devitalized tissue Knowledge deficit related to management of necrotic/devitalized tissue Goals: Necrotic/devitalized tissue will be minimized in the wound bed Groot, Coyle L. ([id number]) Date Initiated: 07/30/2021 Target Resolution Date: 07/30/2021 Goal Status: Active Patient/caregiver will verbalize understanding of reason and process for debridement of necrotic tissue Date Initiated: 07/30/2021 Target Resolution Date: 07/30/2021 Goal Status: Active Interventions: Assess patient pain level pre-, during and post procedure and prior to discharge Provide education on necrotic tissue and debridement process Treatment Activities: Apply topical anesthetic as ordered : 07/30/2021 Excisional debridement : 07/30/2021 Notes: Orientation to the Wound Care Program Nursing Diagnoses: Knowledge deficit related to the wound healing center program Goals: Patient/caregiver will verbalize understanding of the [place] Date Initiated: 07/30/2021 Target Resolution Date: 07/30/2021 Goal Status: Active Interventions: Provide education on orientation to the  wound center Notes: Pain, Acute or Chronic Nursing Diagnoses: Pain Management - Non-cyclic Acute (Procedural) Goals: Patient will verbalize adequate pain control and receive pain control interventions during procedures as needed Date Initiated: 07/30/2021 Target Resolution Date: 07/30/2021 Goal Status: Active Patient/caregiver will verbalize adequate pain control between visits Date Initiated: 07/30/2021 Target Resolution Date: 07/30/2021 Goal Status: Active Patient/caregiver will verbalize comfort level met Date Initiated: 07/30/2021 Target Resolution Date: 07/30/2021 Goal Status: Active Interventions: Complete pain assessment as per visit requirements Treatment Activities: Administer pain control measures as ordered : 07/30/2021 Refer to pain specialist or other pain support program : 07/30/2021 Notes: Wound/Skin Impairment Nursing Diagnoses: Impaired tissue integrity Knowledge deficit related to ulceration/compromised skin integrity Goals: Patient/caregiver will verbalize understanding of skin care regimen Date Initiated: 07/30/2021 Target Resolution Date: 07/30/2021 Goal Status: Active Ulcer/skin breakdown will have a volume reduction of 30% by week 4 Date Initiated: 07/30/2021 Target Resolution Date: 08/20/2021 Rennis Golden, Yousef L. ([id number]) Goal Status: Active Interventions: Assess patient/caregiver ability to perform ulcer/skin care regimen upon admission and as needed Assess ulceration(s) every visit Treatment Activities: Referred to DME Adesuwa Osgood for dressing supplies : 07/30/2021 Skin care regimen initiated : 07/30/2021 Topical wound management initiated : 07/30/2021 Notes: Electronic Signature(s) Signed: 08/03/2021 12:21:47 PM By: Gretta Cool, BSN, RN, CWS, Kim RN, BSN Entered By:  Gretta Cool, BSN, RN, CWS, Kim on 07/30/2021 10:32:14 Rennis Golden, Aiven L. ([id number]) -------------------------------------------------------------------------------- Pain Assessment Details Patient Name: Daniel Eaton, Daniel Eaton. Date of Service: 07/30/2021 9:30 AM Medical Record Number: [id number] Patient Account Number: [id number] Date of Birth/Sex: 1940-09-04 (81 y.o. M) Treating RN: Cornell Barman Primary Care Kenyatta Keidel: Frazier Richards Other Clinician: Referring Hargun Spurling: Frazier Richards Treating Dalaya Suppa/Extender: Skipper Cliche in Treatment: 0 Active Problems Location of Pain Severity and Description of Pain Patient Has Paino Yes Site Locations Pain Location: Generalized Pain, Pain in Ulcers Character of Pain Describe the Pain: Burning Pain Management and Medication Current Pain Management: Electronic Signature(s) Signed: 08/03/2021 12:21:47 PM By: Gretta Cool, BSN, RN, CWS, Kim RN, BSN Entered By: Gretta Cool, BSN, RN, CWS, Kim on 07/30/2021 09:49:32 Daniel Eaton, Daniel Eaton ([id number]) -------------------------------------------------------------------------------- Patient/Caregiver Education Details Patient Name: Daniel Eaton, Daniel L. Date of Service: 07/30/2021 9:30 AM Medical Record Number: [id number] Patient Account Number: [id number] Date of Birth/Gender: [birth year] (81 y.o. M) Treating RN: Cornell Barman Primary Care Physician: Frazier Richards Other Clinician: Referring Physician: Frazier Richards Treating Physician/Extender: Skipper Cliche in Treatment: 0 Education Assessment Education Provided To: Patient Education Topics Provided Welcome To [facility]: Handouts: Welcome To [facility] Methods: Demonstration Responses: State content correctly Wound Debridement: Handouts: Wound Debridement Methods: Demonstration, Explain/Verbal Responses: State content correctly Electronic Signature(s) Signed: 08/03/2021 12:21:47 PM By: Gretta Cool, BSN, RN, CWS, Kim RN, BSN Entered By: Gretta Cool, BSN, RN, CWS, Kim on 07/30/2021 10:57:16 Frate, Dayten L. ([id number]) -------------------------------------------------------------------------------- Wound Assessment Details Patient Name: Daniel Eaton, Daniel Eaton. Date of Service: 07/30/2021 9:30 AM Medical Record Number: [id number] Patient Account Number: [id number] Date of Birth/Sex: [birth year] (81 y.o. M) Treating RN: Cornell Barman Primary Care Donnice Nielsen: Frazier Richards Other Clinician: Referring Lovey Crupi: Frazier Richards Treating Fitzpatrick Alberico/Extender: Jeri Cos Weeks in Treatment: 0 Wound Status Wound Number: 1 Primary Trauma, Other Etiology: Wound Location: Right, Medial Lower Leg Wound Open Wounding Event: Trauma Status: Date Acquired: 06/04/2021 Comorbid Chronic Obstructive Pulmonary Disease (COPD), Sleep Weeks Of Treatment: 0 History: Apnea, Arrhythmia, Osteoarthritis Clustered Wound: No Photos Wound Measurements Length: (cm) 20 Width: (cm) 10 Depth: (cm) 0.7 Area: (cm) 157.08 Volume: (cm) 109.956 % Reduction in Area: 0% % Reduction in Volume: 0% Epithelialization: None Tunneling: No Undermining: Yes Starting Position (o'clock): 9 Ending Position (o'clock): 2 Maximum Distance: (cm) 2.4 Wound Description Classification: Full Thickness Without Exposed Support Struc Wound Margin: Thickened Exudate Amount: Large Exudate Type: Serosanguineous Exudate Color: red, brown tures Foul Odor After Cleansing: No Slough/Fibrino No Wound Bed Necrotic Amount: Large (67-100%) Exposed Structure Necrotic Quality: Eschar Fascia Exposed: No Fat Layer (Subcutaneous Tissue) Exposed: Yes Tendon Exposed: No Muscle Exposed: No Joint Exposed: No Bone Exposed: No Treatment Notes Wound #1 (Lower Leg) Wound Laterality: Right, Medial Cleanser Dukeman, Ruslan L. ([id number]) Dakin 16 (oz) 0.25 Discharge Instruction: Use as directed. Peri-Wound Care Topical Primary Dressing Gauze Discharge Instruction: As directed: moistened with Dakins Solution Secondary Dressing Gauze Discharge Instruction: As directed: dry Secured With Kerlix Roll Sterile or Non-Sterile 6-ply 4.5x4 (yd/yd) Discharge Instruction: Apply Kerlix as  directed Compression Wrap Compression Stockings Add-Ons Ace wrap lightly to secure bandages Electronic Signature(s) Signed: 08/03/2021 12:21:47 PM By: Gretta Cool, BSN, RN, CWS, Kim RN, BSN Entered By: Gretta Cool, BSN, RN, CWS, Kim on 07/30/2021 10:09:32 Castell, Cope L. ([id number]) -------------------------------------------------------------------------------- Vitals Details Patient Name: Daniel Eaton, Daniel Eaton. Date of Service: 07/30/2021 9:30 AM Medical Record Number: [id number] Patient Account Number: [id number] Date of Birth/Sex: [birth year] (81 y.o. M) Treating RN: Cornell Barman Primary Care Meerab Maselli: Frazier Richards Other Clinician: Referring Zyana Amaro:  Frazier Richards Treating Siyona Coto/Extender: Jeri Cos Weeks in Treatment: 0 Vital Signs Time Taken: 09:45 Temperature (F): 97.6 Height (in): 71 Pulse (bpm): 80 Weight (lbs): 257 Respiratory Rate (breaths/min): 16 Body Mass Index (BMI): 35.8 Blood Pressure (mmHg): 117/72 Reference Range: 80 - 120 mg / dl Electronic Signature(s) Signed: 08/03/2021 12:21:47 PM By: Gretta Cool, BSN, RN, CWS, Kim RN, BSN Entered By: Gretta Cool, BSN, RN, CWS, Kim on 07/30/2021 09:50:00

## 2021-08-03 NOTE — Progress Notes (Signed)
Eaton, Daniel L. ([id number]) Visit Report for 07/30/2021 Chief Complaint Document Details Patient Name: Daniel Eaton, Daniel Eaton. Date of Service: 07/30/2021 9:30 AM Medical Record Number: [id number] Patient Account Number: [id number] Date of Birth/Sex: 1940/10/24 (81 y.o. M) Treating RN: Cornell Barman Primary Care Provider: Frazier Richards Other Clinician: Referring Provider: Frazier Richards Treating Provider/Extender: Skipper Cliche in Treatment: 0 Information Obtained from: Patient Chief Complaint Right LE hematoma/ulcer Electronic Signature(s) Signed: 07/30/2021 10:24:14 AM By: Worthy Keeler PA-C Entered By: Worthy Keeler on 07/30/2021 10:24:14 Seiler, Fredy L. ([id number]) -------------------------------------------------------------------------------- Debridement Details Patient Name: Eaton, [name]. Date of Service: 07/30/2021 9:30 AM Medical Record Number: [id number] Patient Account Number: [id number] Date of Birth/Sex: 1940-06-16 (81 y.o. M) Treating RN: Cornell Barman Primary Care Provider: Frazier Richards Other Clinician: Referring Provider: Frazier Richards Treating Provider/Extender: Skipper Cliche in Treatment: 0 Debridement Performed for Wound #1 Right,Medial Lower Leg Assessment: Performed By: Physician Tommie Sams., PA-C Debridement Type: Debridement Level of Consciousness (Pre- Awake and Alert procedure): Pre-procedure Verification/Time Out Yes - 10:33 Taken: Total Area Debrided (L x W): 10 (cm) x 2 (cm) = 20 (cm) Tissue and other material Viable, Non-Viable, Blood Clots, Eschar, Skin: Dermis , Skin: Epidermis debrided: Level: Skin/Epidermis Debridement Description: Selective/Open Wound Instrument: Forceps, Scissors Bleeding: Large Hemostasis Achieved: Pressure Response to Treatment: Procedure was tolerated well Level of Consciousness (Post- Awake and Alert procedure): Post Debridement Measurements of Total Wound Length: (cm) 20 Width: (cm)  10 Depth: (cm) 0.7 Volume: (cm) 109.956 Character of Wound/Ulcer Post Debridement: Stable Post Procedure Diagnosis Same as Pre-procedure Electronic Signature(s) Signed: 07/30/2021 3:32:08 PM By: Worthy Keeler PA-C Signed: 08/03/2021 12:21:47 PM By: Gretta Cool, BSN, RN, CWS, Kim RN, BSN Entered By: Gretta Cool, BSN, RN, CWS, Kim on 07/30/2021 10:34:38 Sweetland, Demonta L. ([id number]) -------------------------------------------------------------------------------- HPI Details Patient Name: Daniel Eaton. Date of Service: 07/30/2021 9:30 AM Medical Record Number: [id number] Patient Account Number: [id number] Date of Birth/Sex: [birth year] (81 y.o. M) Treating RN: Cornell Barman Primary Care Provider: Frazier Richards Other Clinician: Referring Provider: Frazier Richards Treating Provider/Extender: Skipper Cliche in Treatment: 0 History of Present Illness HPI Description: Physical therapy6-16-2023 upon evaluation today patient appears for initial evaluation here in the clinic as a referral from [facility]. Subsequently this is an individual who roughly 2 months ago was using a stand lift to get him up. With that being said he had a significant contusion that occurred as a result of this and unfortunately the patient has been having issues since that time with this area of contusion. It was not until 2 weeks ago when this ruptured and the patient insisted that he wanted to go to the ER for evaluation they cleaned this out as much as possible although there is still some hematoma remaining in the proximal portion underneath the lip but overall this looks much cleaner than it sounds like it was at that point. Nonetheless I do not see any signs of obvious and overt active infection at this point which is great news. No fevers, chills, nausea, vomiting, or diarrhea. The patient does have significant discomfort he tells me that he can be a "big baby when it comes to these things". With that being said I  explained to him that this is a significant wound and he should not feel that way. Patient does have a history of coronary artery disease, congestive heart failure, long-term use of anticoagulant therapy, atrial fibrillation, and COPD. Currently wet-to-dry dressings have been utilized I think doing something similar with  Dakin's would be appropriate. Electronic Signature(s) Signed: 07/30/2021 1:03:20 PM By: Worthy Keeler PA-C Entered By: Worthy Keeler on 07/30/2021 13:03:20 Coachman, Danne L. ([id number]) -------------------------------------------------------------------------------- Physical Exam Details Patient Name: Eaton, [name]. Date of Service: 07/30/2021 9:30 AM Medical Record Number: [id number] Patient Account Number: [id number] Date of Birth/Sex: [birth year] (81 y.o. M) Treating RN: Cornell Barman Primary Care Provider: Frazier Richards Other Clinician: Referring Provider: Frazier Richards Treating Provider/Extender: Skipper Cliche in Treatment: 0 Constitutional sitting or standing blood pressure is within target range for patient.. pulse regular and within target range for patient.Marland Kitchen respirations regular, non- labored and within target range for patient.Marland Kitchen temperature within target range for patient.. Well-nourished and well-hydrated in no acute distress. Eyes conjunctiva clear no eyelid edema noted. pupils equal round and reactive to light and accommodation. Ears, Nose, Mouth, and Throat no gross abnormality of ear auricles or external auditory canals. normal hearing noted during conversation. mucus membranes moist. Respiratory normal breathing without difficulty. Musculoskeletal normal gait and posture. no significant deformity or arthritic changes, no loss or range of motion, no clubbing. Psychiatric this patient is able to make decisions and demonstrates good insight into disease process. Alert and Oriented x 3. pleasant and cooperative. Notes Upon inspection patient's  wound bed actually showed signs of significant necrotic tissue noted at this point. Fortunately I think that we can clean this up with Dakin's moistened gauze performed daily. In regard to the area of undermining I did actually use a Q-tip as well as gauze in order to remove some of the remaining of blood clot from under this region and post cleaning this actually looks much better. Using scissors and forceps I did trim away some of the necrotic tissue on the proximal portion of the wound as well he did not really have any pain with this that was the best part of the procedure for him to be honest it is more of the wound itself that is obviously what hurts him the most unfortunately. Electronic Signature(s) Signed: 07/30/2021 1:04:31 PM By: Worthy Keeler PA-C Entered By: Worthy Keeler on 07/30/2021 13:04:30 Macera, Makari L. ([id number]) -------------------------------------------------------------------------------- Physician Orders Details Patient Name: JUAN, KISSOON. Date of Service: 07/30/2021 9:30 AM Medical Record Number: [id number] Patient Account Number: [id number] Date of Birth/Sex: [birth year] (81 y.o. M) Treating RN: Cornell Barman Primary Care Provider: Frazier Richards Other Clinician: Referring Provider: Frazier Richards Treating Provider/Extender: Skipper Cliche in Treatment: 0 Verbal / Phone Orders: No Diagnosis Coding ICD-10 Coding Code Description S80.11XA Contusion of right lower leg, initial encounter L97.812 Non-pressure chronic ulcer of other part of right lower leg with fat layer exposed I25.10 Atherosclerotic heart disease of native coronary artery without angina pectoris I50.42 Chronic combined systolic (congestive) and diastolic (congestive) heart failure Z79.01 Long term (current) use of anticoagulants I48.0 Paroxysmal atrial fibrillation J44.9 Chronic obstructive pulmonary disease, unspecified Follow-up Appointments o Return Appointment in 1  week. Bathing/ Shower/ Hygiene o May shower with wound dressing protected with water repellent cover or cast protector. Edema Control - Lymphedema / Segmental Compressive Device / Other o Elevate leg(s) parallel to the floor when sitting. Additional Orders / Instructions o Follow Nutritious Diet and Increase Protein Intake o Other: - Prostat and decubivite daily Wound Treatment Wound #1 - Lower Leg Wound Laterality: Right, Medial Cleanser: Dakin 16 (oz) 0.25 (Home Health) 1 x Per Day/30 Days Discharge Instructions: Use as directed. Primary Dressing: Gauze 1 x Per Day/30 Days Discharge Instructions: As directed: moistened with Dakins Solution Secondary Dressing:  Gauze 1 x Per Day/30 Days Discharge Instructions: As directed: dry Secured With: Kerlix Roll Sterile or Non-Sterile 6-ply 4.5x4 (yd/yd) 1 x Per Day/30 Days Discharge Instructions: Apply Kerlix as directed Add-Ons: Ace wrap lightly to secure bandages 1 x Per Day/30 Days Electronic Signature(s) Signed: 07/30/2021 3:32:08 PM By: Worthy Keeler PA-C Signed: 08/03/2021 12:21:47 PM By: Gretta Cool, BSN, RN, CWS, Kim RN, BSN Entered By: Gretta Cool, BSN, RN, CWS, Kim on 07/30/2021 10:43:40 Papesh, Claris L. ([id number]) -------------------------------------------------------------------------------- Problem List Details Patient Name: CLESTON, LAUTNER. Date of Service: 07/30/2021 9:30 AM Medical Record Number: [id number] Patient Account Number: [id number] Date of Birth/Sex: [birth year] (81 y.o. M) Treating RN: Cornell Barman Primary Care Provider: Frazier Richards Other Clinician: Referring Provider: Frazier Richards Treating Provider/Extender: Skipper Cliche in Treatment: 0 Active Problems ICD-10 Encounter Code Description Active Date MDM Diagnosis S80.11XA Contusion of right lower leg, initial encounter 07/30/2021 No Yes L97.812 Non-pressure chronic ulcer of other part of right lower leg with fat layer 07/30/2021 No Yes exposed I25.10  Atherosclerotic heart disease of native coronary artery without angina 07/30/2021 No Yes pectoris I50.42 Chronic combined systolic (congestive) and diastolic (congestive) heart 07/30/2021 No Yes failure Z79.01 Long term (current) use of anticoagulants 07/30/2021 No Yes I48.0 Paroxysmal atrial fibrillation 07/30/2021 No Yes J44.9 Chronic obstructive pulmonary disease, unspecified 07/30/2021 No Yes Inactive Problems Resolved Problems Electronic Signature(s) Signed: 07/30/2021 10:22:24 AM By: Worthy Keeler PA-C Entered By: Worthy Keeler on 07/30/2021 10:22:24 Lazare, [place] ([id number]) -------------------------------------------------------------------------------- Progress Note Details Patient Name: TAYSEN, BUSHART. Date of Service: 07/30/2021 9:30 AM Medical Record Number: [id number] Patient Account Number: [id number] Date of Birth/Sex: [birth year] (81 y.o. M) Treating RN: Cornell Barman Primary Care Provider: Frazier Richards Other Clinician: Referring Provider: Frazier Richards Treating Provider/Extender: Skipper Cliche in Treatment: 0 Subjective Chief Complaint Information obtained from Patient Right LE hematoma/ulcer History of Present Illness (HPI) Physical therapy6-16-2023 upon evaluation today patient appears for initial evaluation here in the clinic as a referral from [facility]. Subsequently this is an individual who roughly 2 months ago was using a stand lift to get him up. With that being said he had a significant contusion that occurred as a result of this and unfortunately the patient has been having issues since that time with this area of contusion. It was not until 2 weeks ago when this ruptured and the patient insisted that he wanted to go to the ER for evaluation they cleaned this out as much as possible although there is still some hematoma remaining in the proximal portion underneath the lip but overall this looks much cleaner than it sounds like it was at that  point. Nonetheless I do not see any signs of obvious and overt active infection at this point which is great news. No fevers, chills, nausea, vomiting, or diarrhea. The patient does have significant discomfort he tells me that he can be a "big baby when it comes to these things". With that being said I explained to him that this is a significant wound and he should not feel that way. Patient does have a history of coronary artery disease, congestive heart failure, long-term use of anticoagulant therapy, atrial fibrillation, and COPD. Currently wet-to-dry dressings have been utilized I think doing something similar with Dakin's would be appropriate. Patient History Allergies Dilantin Social History Former smoker, Marital Status - Divorced, Alcohol Use - Never, Drug Use - No History, Caffeine Use - Never. Medical History Respiratory Patient has history of Chronic Obstructive Pulmonary Disease (COPD),  Sleep Apnea Cardiovascular Patient has history of Arrhythmia - A-fib Musculoskeletal Patient has history of Osteoarthritis - shoulders and knees Medical And Surgical History Notes Respiratory Enlarged heart Genitourinary CKD Stage 3 Review of Systems (ROS) Integumentary (Skin) Complains or has symptoms of Wounds, Bleeding or bruising tendency. Objective Constitutional sitting or standing blood pressure is within target range for patient.. pulse regular and within target range for patient.Marland Kitchen respirations regular, non- labored and within target range for patient.Marland Kitchen temperature within target range for patient.. Well-nourished and well-hydrated in no acute distress. Vitals Time Taken: 9:45 AM, Height: 71 in, Weight: 257 lbs, BMI: 35.8, Temperature: 97.6 F, Pulse: 80 bpm, Respiratory Rate: 16 breaths/min, Blood Pressure: 117/72 mmHg. Eyes Mazurowski, Leah L. ([id number]) conjunctiva clear no eyelid edema noted. pupils equal round and reactive to light and accommodation. Ears, Nose, Mouth, and  Throat no gross abnormality of ear auricles or external auditory canals. normal hearing noted during conversation. mucus membranes moist. Respiratory normal breathing without difficulty. Musculoskeletal normal gait and posture. no significant deformity or arthritic changes, no loss or range of motion, no clubbing. Psychiatric this patient is able to make decisions and demonstrates good insight into disease process. Alert and Oriented x 3. pleasant and cooperative. General Notes: Upon inspection patient's wound bed actually showed signs of significant necrotic tissue noted at this point. Fortunately I think that we can clean this up with Dakin's moistened gauze performed daily. In regard to the area of undermining I did actually use a Q-tip as well as gauze in order to remove some of the remaining of blood clot from under this region and post cleaning this actually looks much better. Using scissors and forceps I did trim away some of the necrotic tissue on the proximal portion of the wound as well he did not really have any pain with this that was the best part of the procedure for him to be honest it is more of the wound itself that is obviously what hurts him the most unfortunately. Integumentary (Hair, Skin) Wound #1 status is Open. Original cause of wound was Trauma. The date acquired was: 06/04/2021. The wound is located on the Right,Medial Lower Leg. The wound measures 20cm length x 10cm width x 0.7cm depth; 157.08cm^2 area and 109.956cm^3 volume. There is Fat Layer (Subcutaneous Tissue) exposed. There is no tunneling noted, however, there is undermining starting at 9:00 and ending at 2:00 with a maximum distance of 2.4cm. There is a large amount of serosanguineous drainage noted. The wound margin is thickened. There is a large (67-100%) amount of necrotic tissue within the wound bed including Eschar. Assessment Active Problems ICD-10 Contusion of right lower leg, initial  encounter Non-pressure chronic ulcer of other part of right lower leg with fat layer exposed Atherosclerotic heart disease of native coronary artery without angina pectoris Chronic combined systolic (congestive) and diastolic (congestive) heart failure Long term (current) use of anticoagulants Paroxysmal atrial fibrillation Chronic obstructive pulmonary disease, unspecified Procedures Wound #1 Pre-procedure diagnosis of Wound #1 is a Trauma, Other located on the Right,Medial Lower Leg . There was a Selective/Open Wound Skin/Epidermis Debridement with a total area of 20 sq cm performed by Tommie Sams., PA-C. With the following instrument(s): Forceps, and Scissors to remove Viable and Non-Viable tissue/material. Material removed includes Blood Clots, Eschar, Skin: Dermis, and Skin: Epidermis. No specimens were taken. A time out was conducted at 10:33, prior to the start of the procedure. A Large amount of bleeding was controlled with Pressure. The procedure was tolerated well.  Post Debridement Measurements: 20cm length x 10cm width x 0.7cm depth; 109.956cm^3 volume. Character of Wound/Ulcer Post Debridement is stable. Post procedure Diagnosis Wound #1: Same as Pre-Procedure Plan Follow-up Appointments: Return Appointment in 1 week. Bathing/ Shower/ Hygiene: May shower with wound dressing protected with water repellent cover or cast protector. Edema Control - Lymphedema / Segmental Compressive Device / Other: Elevate leg(s) parallel to the floor when sitting. Additional Orders / Instructions: Astacio, Javarius L. ([id number]) Follow Nutritious Diet and Increase Protein Intake Other: - Prostat and decubivite daily WOUND #1: - Lower Leg Wound Laterality: Right, Medial Cleanser: Dakin 16 (oz) 0.25 (Home Health) 1 x Per Day/30 Days Discharge Instructions: Use as directed. Primary Dressing: Gauze 1 x Per Day/30 Days Discharge Instructions: As directed: moistened with Dakins Solution Secondary  Dressing: Gauze 1 x Per Day/30 Days Discharge Instructions: As directed: dry Secured With: Kerlix Roll Sterile or Non-Sterile 6-ply 4.5x4 (yd/yd) 1 x Per Day/30 Days Discharge Instructions: Apply Kerlix as directed Add-Ons: Ace wrap lightly to secure bandages 1 x Per Day/30 Days 1. Would recommend currently based on what I am seeing that we go ahead and initiate a continuation of treatment with the wet-to-dry dressings that we can use Dakin's moistened gauze to pack underneath the area of undermining and over the wound itself. 2. This should be covered with roll gauze and ABD pads to secure in place. 3. I am also can recommend that the patient continue to utilize the Ace wrap to hold everything in place that should just be lightly applied. 4. I do not see any signs of obvious infection right now although if anything changes we will keep a close eye on things I do plan to see him next week which I think is good to be necessary. We will see patient back for reevaluation in 1 week here in the clinic. If anything worsens or changes patient will contact our office for additional recommendations. Electronic Signature(s) Signed: 07/30/2021 1:05:33 PM By: Worthy Keeler PA-C Entered By: Worthy Keeler on 07/30/2021 13:05:32 Ayub, Kiwan L. ([id number]) -------------------------------------------------------------------------------- ROS/PFSH Details Patient Name: KALIEL, BOLDS. Date of Service: 07/30/2021 9:30 AM Medical Record Number: [id number] Patient Account Number: [id number] Date of Birth/Sex: 06/09/1940 (81 y.o. M) Treating RN: Cornell Barman Primary Care Provider: Frazier Richards Other Clinician: Referring Provider: Frazier Richards Treating Provider/Extender: Skipper Cliche in Treatment: 0 Integumentary (Skin) Complaints and Symptoms: Positive for: Wounds; Bleeding or bruising tendency Respiratory Medical History: Positive for: Chronic Obstructive Pulmonary Disease (COPD); Sleep  Apnea Past Medical History Notes: Enlarged heart Cardiovascular Medical History: Positive for: Arrhythmia - A-fib Genitourinary Medical History: Past Medical History Notes: CKD Stage 3 Musculoskeletal Medical History: Positive for: Osteoarthritis - shoulders and knees Immunizations Pneumococcal Vaccine: Received Pneumococcal Vaccination: No Implantable Devices No devices added Family and Social History Former smoker; Marital Status - Divorced; Alcohol Use: Never; Drug Use: No History; Caffeine Use: Never Electronic Signature(s) Signed: 07/30/2021 3:32:08 PM By: Worthy Keeler PA-C Signed: 08/03/2021 12:21:47 PM By: Gretta Cool, BSN, RN, CWS, Kim RN, BSN Entered By: Gretta Cool, BSN, RN, CWS, Kim on 07/30/2021 09:53:54 Bochicchio, Salaam L. ([id number]) -------------------------------------------------------------------------------- SuperBill Details Patient Name: KOLETON, DUCHEMIN. Date of Service: 07/30/2021 Medical Record Number: [id number] Patient Account Number: [id number] Date of Birth/Sex: 1940/07/21 (81 y.o. M) Treating RN: Cornell Barman Primary Care Provider: Frazier Richards Other Clinician: Referring Provider: Frazier Richards Treating Provider/Extender: Skipper Cliche in Treatment: 0 Diagnosis Coding ICD-10 Codes Code Description S80.11XA Contusion of right lower leg, initial encounter L97.812 Non-pressure  chronic ulcer of other part of right lower leg with fat layer exposed I25.10 Atherosclerotic heart disease of native coronary artery without angina pectoris I50.42 Chronic combined systolic (congestive) and diastolic (congestive) heart failure Z79.01 Long term (current) use of anticoagulants I48.0 Paroxysmal atrial fibrillation J44.9 Chronic obstructive pulmonary disease, unspecified Facility Procedures CPT4 Code: 44818563 Description: 99213 - WOUND CARE VISIT-LEV 3 EST PT Modifier: Quantity: 1 CPT4 Code: 14970263 Description: 78588 - DEBRIDE WOUND 1ST 20 SQ CM OR  < Modifier: Quantity: 1 CPT4 Code: Description: ICD-10 Diagnosis Description L97.812 Non-pressure chronic ulcer of other part of right lower leg with fat layer Modifier: exposed Quantity: Physician Procedures CPT4 Code: 5027741 Description: WC PHYS LEVEL 3 o NEW PT Modifier: 25 Quantity: 1 CPT4 Code: Description: ICD-10 Diagnosis Description S80.11XA Contusion of right lower leg, initial encounter L97.812 Non-pressure chronic ulcer of other part of right lower leg with fat layer I25.10 Atherosclerotic heart disease of native coronary artery without  angina pec I50.42 Chronic combined systolic (congestive) and diastolic (congestive) heart fa Modifier: exposed toris ilure Quantity: CPT4 Code: 2878676 Description: 72094 - WC PHYS DEBR WO ANESTH 20 SQ CM Modifier: Quantity: 1 CPT4 Code: Description: ICD-10 Diagnosis Description L97.812 Non-pressure chronic ulcer of other part of right lower leg with fat layer Modifier: exposed Quantity: Electronic Signature(s) Signed: 07/30/2021 1:05:54 PM By: Worthy Keeler PA-C Entered By: Worthy Keeler on 07/30/2021 13:05:54

## 2021-08-03 NOTE — Progress Notes (Signed)
Eaton, Daniel L. ([id number]) Visit Report for 07/30/2021 Abuse Risk Screen Details Patient Name: Daniel Eaton, Daniel Eaton. Date of Service: 07/30/2021 9:30 AM Medical Record Number: [id number] Patient Account Number: [id number] Date of Birth/Sex: 1940/03/20 (81 y.o. M) Treating RN: Cornell Barman Primary Care Prima Rayner: Frazier Richards Other Clinician: Referring Yisel Megill: Frazier Richards Treating Zaiah Eckerson/Extender: Skipper Cliche in Treatment: 0 Abuse Risk Screen Items Answer ABUSE RISK SCREEN: Has anyone close to you tried to hurt or harm you recentlyo No Do you feel uncomfortable with anyone in your familyo No Has anyone forced you do things that you didnot want to doo No Electronic Signature(s) Signed: 08/03/2021 12:21:47 PM By: Gretta Cool, BSN, RN, CWS, Kim RN, BSN Entered By: Gretta Cool, BSN, RN, CWS, Kim on 07/30/2021 09:54:22 Eaton, Daniel L. ([id number]) -------------------------------------------------------------------------------- Activities of Daily Living Details Patient Name: Daniel Eaton, Daniel Eaton. Date of Service: 07/30/2021 9:30 AM Medical Record Number: [id number] Patient Account Number: [id number] Date of Birth/Sex: Jan 10, 1941 (81 y.o. M) Treating RN: Cornell Barman Primary Care Marzella Miracle: Frazier Richards Other Clinician: Referring Charlcie Prisco: Frazier Richards Treating Tj Kitchings/Extender: Skipper Cliche in Treatment: 0 Activities of Daily Living Items Answer Activities of Daily Living (Please select one for each item) Drive Automobile Not Able Take Medications Need Assistance Use Telephone Need Assistance Care for Appearance Need Assistance Use Toilet Need Counsellor / Shower Need Assistance Dress Self Need Assistance Feed Self Need Assistance Walk Need Assistance Get In / Out Bed Need Assistance Housework Need Assistance Prepare Meals Need Assistance Handle Money Need Assistance Shop for Self Need Assistance Electronic Signature(s) Signed: 08/03/2021 12:21:47 PM By: Gretta Cool,  BSN, RN, CWS, Kim RN, BSN Entered By: Gretta Cool, BSN, RN, CWS, Kim on 07/30/2021 09:54:35 Osterlund, Michael L. ([id number]) -------------------------------------------------------------------------------- Education Screening Details Patient Name: Daniel Eaton, Daniel Eaton. Date of Service: 07/30/2021 9:30 AM Medical Record Number: [id number] Patient Account Number: [id number] Date of Birth/Sex: [birth year] (81 y.o. M) Treating RN: Cornell Barman Primary Care Destony Prevost: Frazier Richards Other Clinician: Referring Chrisandra Wiemers: Frazier Richards Treating Anaid Haney/Extender: Skipper Cliche in Treatment: 0 Learning Preferences/Education Level/Primary Language Learning Preference: Explanation Highest Education Level: College or Above Preferred Language: English Cognitive Barrier Language Barrier: No Translator Needed: No Memory Deficit: No Emotional Barrier: No Physical Barrier Impaired Vision: No Impaired Hearing: Yes Hearing Aid Knowledge/Comprehension Knowledge Level: High Comprehension Level: High Ability to understand written instructions: High Ability to understand verbal instructions: High Motivation Anxiety Level: Calm Cooperation: Cooperative Education Importance: Acknowledges Need Interest in Health Problems: Asks Questions Perception: Coherent Willingness to Engage in Self-Management High Activities: Readiness to Engage in Self-Management High Activities: Engineer, maintenance) Signed: 08/03/2021 12:21:47 PM By: Gretta Cool, BSN, RN, CWS, Kim RN, BSN Entered By: Gretta Cool, BSN, RN, CWS, Kim on 07/30/2021 09:55:06 Mulvaney, Bennett ([id number]) -------------------------------------------------------------------------------- Fall Risk Assessment Details Patient Name: Eaton, Daniel. Date of Service: 07/30/2021 9:30 AM Medical Record Number: [id number] Patient Account Number: [id number] Date of Birth/Sex: 12/03/1940 (81 y.o. M) Treating RN: Cornell Barman Primary Care Latise Dilley: Frazier Richards Other  Clinician: Referring Kendel Pesnell: Frazier Richards Treating Larri Brewton/Extender: Skipper Cliche in Treatment: 0 Fall Risk Assessment Items Have you had 2 or more falls in the last 12 monthso 0 Yes Have you had any fall that resulted in injury in the last 12 monthso 0 Yes FALLS RISK SCREEN History of falling - immediate or within 3 months 25 Yes Secondary diagnosis (Do you have 2 or more medical diagnoseso) 0 No Ambulatory aid None/bed rest/wheelchair/nurse 0 Yes Crutches/cane/walker 0 No Furniture 0 No Intravenous therapy Access/Saline/Heparin Lock 0 No Gait/Transferring Normal/  bed rest/ wheelchair 0 Yes Weak (short steps with or without shuffle, stooped but able to lift head while walking, may 0 No seek support from furniture) Impaired (short steps with shuffle, may have difficulty arising from chair, head down, impaired 0 No balance) Mental Status Oriented to own ability 0 Yes Electronic Signature(s) Signed: 08/03/2021 12:21:47 PM By: Gretta Cool, BSN, RN, CWS, Kim RN, BSN Entered By: Gretta Cool, BSN, RN, CWS, Kim on 07/30/2021 09:55:44 Anctil, Sostenes L. ([id number]) -------------------------------------------------------------------------------- Foot Assessment Details Patient Name: Daniel Eaton, Daniel Eaton. Date of Service: 07/30/2021 9:30 AM Medical Record Number: [id number] Patient Account Number: [id number] Date of Birth/Sex: [birth year] (81 y.o. M) Treating RN: Cornell Barman Primary Care Lashaye Fisk: Frazier Richards Other Clinician: Referring Chadwin Fury: Frazier Richards Treating Yousra Ivens/Extender: Skipper Cliche in Treatment: 0 Foot Assessment Items Site Locations + = Sensation present, - = Sensation absent, C = Callus, U = Ulcer R = Redness, W = Warmth, M = Maceration, PU = Pre-ulcerative lesion F = Fissure, S = Swelling, D = Dryness Assessment Right: Left: Other Deformity: No No Prior Foot Ulcer: No No Prior Amputation: No No Charcot Joint: No No Ambulatory Status:  Non-ambulatory Assistance Device: Wheelchair Gait: Engineer, maintenance) Signed: 08/03/2021 12:21:47 PM By: Gretta Cool, BSN, RN, CWS, Kim RN, BSN Entered By: Gretta Cool, BSN, RN, CWS, Kim on 07/30/2021 09:56:31 [place], [place] ([id number]) -------------------------------------------------------------------------------- Nutrition Risk Screening Details Patient Name: Daniel Eaton, Daniel Eaton. Date of Service: 07/30/2021 9:30 AM Medical Record Number: [id number] Patient Account Number: [id number] Date of Birth/Sex: [birth year] (81 y.o. M) Treating RN: Cornell Barman Primary Care Keena Heesch: Frazier Richards Other Clinician: Referring Cutter Passey: Frazier Richards Treating Briyanna Billingham/Extender: Skipper Cliche in Treatment: 0 Height (in): 71 Weight (lbs): 257 Body Mass Index (BMI): 35.8 Nutrition Risk Screening Items Score Screening NUTRITION RISK SCREEN: I have an illness or condition that made me change the kind and/or amount of food I eat 0 No I eat fewer than two meals per day 0 No I eat few fruits and vegetables, or milk products 0 No I have three or more drinks of beer, liquor or wine almost every day 0 No I have tooth or mouth problems that make it hard for me to eat 0 No I don't always have enough money to buy the food I need 0 No I eat alone most of the time 0 No I take three or more different prescribed or over-the-counter drugs a day 1 Yes Without wanting to, I have lost or gained 10 pounds in the last six months 0 No I am not always physically able to shop, cook and/or feed myself 0 No Nutrition Protocols Good Risk Protocol Moderate Risk Protocol High Risk Proctocol Risk Level: Good Risk Score: 1 Electronic Signature(s) Signed: 08/03/2021 12:21:47 PM By: Gretta Cool, BSN, RN, CWS, Kim RN, BSN Entered By: Gretta Cool, BSN, RN, CWS, Kim on 07/30/2021 09:56:13

## 2021-08-04 DIAGNOSIS — L97819 Non-pressure chronic ulcer of other part of right lower leg with unspecified severity: Secondary | ICD-10-CM | POA: Diagnosis not present

## 2021-08-06 ENCOUNTER — Ambulatory Visit: Payer: HMO | Admitting: Physician Assistant

## 2021-08-11 DIAGNOSIS — L97819 Non-pressure chronic ulcer of other part of right lower leg with unspecified severity: Secondary | ICD-10-CM | POA: Diagnosis not present

## 2021-08-16 DIAGNOSIS — M6281 Muscle weakness (generalized): Secondary | ICD-10-CM | POA: Diagnosis not present

## 2021-08-16 DIAGNOSIS — R2689 Other abnormalities of gait and mobility: Secondary | ICD-10-CM | POA: Diagnosis not present

## 2021-08-16 DIAGNOSIS — R279 Unspecified lack of coordination: Secondary | ICD-10-CM | POA: Diagnosis not present

## 2021-08-18 DIAGNOSIS — L97829 Non-pressure chronic ulcer of other part of left lower leg with unspecified severity: Secondary | ICD-10-CM | POA: Diagnosis not present

## 2021-08-18 DIAGNOSIS — L97819 Non-pressure chronic ulcer of other part of right lower leg with unspecified severity: Secondary | ICD-10-CM | POA: Diagnosis not present

## 2021-08-24 ENCOUNTER — Ambulatory Visit (INDEPENDENT_AMBULATORY_CARE_PROVIDER_SITE_OTHER): Payer: PPO | Admitting: Nurse Practitioner

## 2021-08-24 ENCOUNTER — Encounter (INDEPENDENT_AMBULATORY_CARE_PROVIDER_SITE_OTHER): Payer: Self-pay | Admitting: Nurse Practitioner

## 2021-08-24 VITALS — BP 135/74 | HR 87 | Resp 16

## 2021-08-24 DIAGNOSIS — I872 Venous insufficiency (chronic) (peripheral): Secondary | ICD-10-CM

## 2021-08-24 DIAGNOSIS — S81801A Unspecified open wound, right lower leg, initial encounter: Secondary | ICD-10-CM

## 2021-08-24 DIAGNOSIS — I10 Essential (primary) hypertension: Secondary | ICD-10-CM | POA: Diagnosis not present

## 2021-08-24 NOTE — Progress Notes (Signed)
Subjective:    Patient ID: Daniel Nan., male    DOB: 12-06-1940, 81 y.o.   MRN: [id number] Chief Complaint  Patient presents with   New Patient (Initial Visit)    Ref Simpson-[facility]h consult wound    The patient is a 81 year old male who is referred by his nursing facility in regards to a right lower extremity wound.  The patient notes that he developed a hematoma on his right leg after hitting it on a [employer] lift.  The hematoma subsequently ruptured and he was taken to [facility] and the wound was debrided.  He notes that since that time he has been having wound care done by the facility and the wound has reportedly been healing without significant issue.  The patient does have notable weakness of his bilateral lower extremities and spends most of his time wheelchair-bound.  He also has a wound on his left lower extremity.  This has been there for approximately a month.  He notes that it heals and then gets worse again once he gets traumatized the area again.  Currently denies any fevers or chills.    Review of Systems  Cardiovascular:  Positive for leg swelling.  Skin:  Positive for wound.  Neurological:  Positive for weakness.  All other systems reviewed and are negative.      Objective:   Physical Exam Vitals reviewed.  HENT:     Head: Normocephalic.  Cardiovascular:     Rate and Rhythm: Normal rate.  Pulmonary:     Effort: Pulmonary effort is normal.  Musculoskeletal:     Right lower leg: Edema present.     Left lower leg: Edema present.  Skin:    General: Skin is warm and dry.  Neurological:     Mental Status: He is alert and oriented to person, place, and time.  Psychiatric:        Mood and Affect: Mood normal.        Behavior: Behavior normal.        Thought Content: Thought content normal.        Judgment: Judgment normal.     BP 135/74 (BP Location: Left Arm)   Pulse 87   Resp 16   Past Medical History:  Diagnosis Date   A-fib  (HCC)    Cellulitis    CHF (congestive heart failure) (HCC)    COPD (chronic obstructive pulmonary disease) (HCC)    Difficult intubation    Hyperlipidemia    Hypertension    Hypothyroidism    Kidney disease    Sleep apnea    Stroke [facility])    Thyroid disease     Social History   Socioeconomic History   Marital status: Divorced    Spouse name: Not on file   Number of children: Not on file   Years of education: Not on file   Highest education level: Not on file  Occupational History   Not on file  Tobacco Use   Smoking status: Former   Smokeless tobacco: Never  Vaping Use   Vaping Use: Never used  Substance and Sexual Activity   Alcohol use: No   Drug use: No   Sexual activity: Not Currently  Other Topics Concern   Not on file  Social History Narrative   Not on file   Social Determinants of Health   Financial Resource Strain: Not on file  Food Insecurity: Not on file  Transportation Needs: Not on file  Physical Activity:  Not on file  Stress: Not on file  Social Connections: Not on file  Intimate Partner Violence: Not on file    Past Surgical History:  Procedure Laterality Date   CARDIAC CATHETERIZATION     CATARACT EXTRACTION W/ INTRAOCULAR LENS  IMPLANT, BILATERAL     COLONOSCOPY  2008   HERNIA REPAIR     bilateral inguinal hernia/ [facility]   HERNIA REPAIR  02/02/2015   18 x 28 cm ventral light mesh placed laparoscopically.   TONSILLECTOMY     UMBILICAL HERNIA REPAIR N/A 02/02/2015   Procedure: HERNIA REPAIR UMBILICAL ADULT;  Surgeon: Robert Bellow, MD;  Location: [facility] ORS;  Service: General;  Laterality: N/A;   VENTRAL HERNIA REPAIR N/A 02/02/2015   Procedure: LAPAROSCOPIC VENTRAL HERNIA;  Surgeon: Robert Bellow, MD;  Location: [facility] ORS;  Service: General;  Laterality: N/A;   vp shunt placement  1979    Family History  Problem Relation Age of Onset   Heart disease Mother    Alcohol abuse Father     Allergies  Allergen Reactions    Diltiazem Hcl Itching and Other (See Comments)    edema       Latest Ref Rng & Units 07/21/2021    8:36 PM 02/18/2021    8:54 AM 02/17/2021   10:56 AM  CBC  WBC 4.0 - 10.5 K/uL 10.6  8.5  7.9   Hemoglobin 13.0 - 17.0 g/dL 8.5  8.5  8.1   Hematocrit 39.0 - 52.0 % 30.1  30.4  29.2   Platelets 150 - 400 K/uL 345  288  298       CMP     Component Value Date/Time   NA 140 07/21/2021 2036   NA 138 06/20/2012 0757   K 4.3 07/21/2021 2036   K 2.9 (L) 06/20/2012 0757   CL 107 07/21/2021 2036   CL 97 (L) 06/20/2012 0757   CO2 24 07/21/2021 2036   CO2 36 (H) 06/20/2012 0757   GLUCOSE 119 (H) 07/21/2021 2036   GLUCOSE 87 06/20/2012 0757   BUN 20 07/21/2021 2036   BUN 34 (H) 06/20/2012 0757   CREATININE 1.13 07/21/2021 2036   CREATININE 1.59 (H) 07/23/2013 1345   CALCIUM 8.6 (L) 07/21/2021 2036   CALCIUM 8.1 (L) 06/20/2012 0757   PROT 5.9 (L) 07/21/2021 2036   PROT 7.8 06/18/2012 1151   ALBUMIN 2.9 (L) 07/21/2021 2036   ALBUMIN 3.7 06/18/2012 1151   AST 19 07/21/2021 2036   AST 49 (H) 06/18/2012 1151   ALT 13 07/21/2021 2036   ALT 38 06/18/2012 1151   ALKPHOS 50 07/21/2021 2036   ALKPHOS 75 06/18/2012 1151   BILITOT 0.6 07/21/2021 2036   BILITOT 0.9 06/18/2012 1151   GFRNONAA >60 07/21/2021 2036   GFRNONAA 42 (L) 07/23/2013 1345   GFRAA >60 12/14/2018 0452   GFRAA 49 (L) 07/23/2013 1345     No results found.     Assessment & Plan:   1. Wound of right lower extremity, initial encounter The wound on the right lower extremity is a traumatic wound post hematoma.  In this instance it would be prudent to evaluate arterial studies to ensure the patient does not have ability to continue with wound healing.  Otherwise patient will be directed to continue with wound treatment activities as directed by his facility.  2. Essential hypertension Continue antihypertensive medications as already ordered, these medications have been reviewed and there are no changes at this time.   3.  Venous stasis dermatitis of both lower extremities The patient has a wound on the left lower extremity that appears to be in stages of healing.  This is somewhat concerning for possible venous involvement given the location of the wound.  The patient also has noted swelling on the left lower extremity.  When he returns we will obtain a venous reflux study to determine if reflux is playing a component in the delaying healing of this wound.  Patient will continue with wound care as done by the facility.   Current Outpatient Medications on File Prior to Visit  Medication Sig Dispense Refill   acetaminophen (TYLENOL) 500 MG tablet Take 500 mg by mouth every 6 (six) hours as needed for moderate pain.     Acetaminophen 500 MG capsule Take 1,000 mg by mouth See admin instructions. Bid x 21 days     albuterol (VENTOLIN HFA) 108 (90 Base) MCG/ACT inhaler Inhale 2 puffs into the lungs every 6 (six) hours as needed for wheezing or shortness of breath. (Patient taking differently: Inhale 2 puffs into the lungs See admin instructions. Qid x 3 days)     albuterol (VENTOLIN HFA) 108 (90 Base) MCG/ACT inhaler Inhale 2 puffs into the lungs every 6 (six) hours as needed for wheezing.     amiodarone (PACERONE) 200 MG tablet Take 200 mg by mouth daily.      ammonium lactate (LAC-HYDRIN) 12 % lotion Apply 1 Application topically as needed for dry skin.     apixaban (ELIQUIS) 5 MG TABS tablet Take 5 mg by mouth 2 (two) times daily.     Carboxymethylcellulose Sod PF (REFRESH CELLUVISC) 1 % GEL Apply 1 drop to eye every 12 (twelve) hours. Right eye     Fluticasone-Umeclidin-Vilant (TRELEGY ELLIPTA) 100-62.5-25 MCG/INH AEPB Inhale 1 puff into the lungs daily.     HYDROcodone-acetaminophen (NORCO/VICODIN) 5-325 MG tablet Take 1 tablet by mouth every 8 (eight) hours as needed for moderate pain.     levothyroxine (SYNTHROID) 88 MCG tablet Take 88 mcg by mouth daily.      loratadine (CLARITIN) 10 MG tablet Take 10 mg by mouth  daily.     metoprolol succinate (TOPROL-XL) 50 MG 24 hr tablet Take 50 mg by mouth daily. Take with or immediately following a meal.     Multiple Vitamins-Minerals (DECUBI-VITE PO) Take 1 capsule by mouth daily. 400 mcg -50 mg     Omega-3 Fatty Acids (OMEGA 3 PO) Take 2,000 mg by mouth daily.     Omeprazole 20 MG TBEC Take 40 mg by mouth daily.      polyethylene glycol (MIRALAX / GLYCOLAX) 17 g packet Take 17 g by mouth daily as needed. (Patient taking differently: Take 17 g by mouth daily as needed for mild constipation.) 14 each 0   potassium chloride (MICRO-K) 10 MEQ CR capsule Hold while not taking diuretics.     sertraline (ZOLOFT) 25 MG tablet Take 25 mg by mouth daily.     spironolactone (ALDACTONE) 50 MG tablet Hold pending outpatient followup due to acute kidney injury.     torsemide (DEMADEX) 20 MG tablet Hold pending outpatient followup due to acute kidney injury. 30 tablet 0   traZODone (DESYREL) 50 MG tablet Take 50 mg by mouth at bedtime.     guaiFENesin (MUCINEX) 600 MG 12 hr tablet Take 600 mg by mouth See admin instructions. Bid x 5 days (Patient not taking: Reported on 08/24/2021)     hydrOXYzine (ATARAX) 25 MG tablet Take 25  mg by mouth See admin instructions. Every 12 hours x 30 days for pruritis     metolazone (ZAROXOLYN) 2.5 MG tablet Hold pending outpatient followup due to acute kidney injury. (Patient not taking: Reported on 03/06/2021)     Multiple Vitamin (MULTI-VITAMINS) TABS Take 1 tablet by mouth daily.  (Patient not taking: Reported on 08/24/2021)     predniSONE (DELTASONE) 20 MG tablet Take 40 mg by mouth See admin instructions. Qd x 5 days (Patient not taking: Reported on 08/24/2021)     No current facility-administered medications on file prior to visit.    There are no Patient Instructions on file for this visit. No follow-ups on file.   Kris Hartmann, NP

## 2021-08-25 DIAGNOSIS — L97819 Non-pressure chronic ulcer of other part of right lower leg with unspecified severity: Secondary | ICD-10-CM | POA: Diagnosis not present

## 2021-08-30 DIAGNOSIS — S81811D Laceration without foreign body, right lower leg, subsequent encounter: Secondary | ICD-10-CM | POA: Diagnosis not present

## 2021-09-01 DIAGNOSIS — L97819 Non-pressure chronic ulcer of other part of right lower leg with unspecified severity: Secondary | ICD-10-CM | POA: Diagnosis not present

## 2021-09-07 DIAGNOSIS — S81812D Laceration without foreign body, left lower leg, subsequent encounter: Secondary | ICD-10-CM | POA: Diagnosis not present

## 2021-09-07 DIAGNOSIS — S81811D Laceration without foreign body, right lower leg, subsequent encounter: Secondary | ICD-10-CM | POA: Diagnosis not present

## 2021-09-15 DIAGNOSIS — L97819 Non-pressure chronic ulcer of other part of right lower leg with unspecified severity: Secondary | ICD-10-CM | POA: Diagnosis not present

## 2021-09-27 DIAGNOSIS — M6281 Muscle weakness (generalized): Secondary | ICD-10-CM | POA: Diagnosis not present

## 2021-10-12 DIAGNOSIS — G4733 Obstructive sleep apnea (adult) (pediatric): Secondary | ICD-10-CM | POA: Diagnosis not present

## 2021-10-12 DIAGNOSIS — J449 Chronic obstructive pulmonary disease, unspecified: Secondary | ICD-10-CM | POA: Diagnosis not present

## 2021-10-14 ENCOUNTER — Other Ambulatory Visit (INDEPENDENT_AMBULATORY_CARE_PROVIDER_SITE_OTHER): Payer: Self-pay | Admitting: Nurse Practitioner

## 2021-10-14 DIAGNOSIS — I872 Venous insufficiency (chronic) (peripheral): Secondary | ICD-10-CM

## 2021-10-14 DIAGNOSIS — S81801A Unspecified open wound, right lower leg, initial encounter: Secondary | ICD-10-CM

## 2021-10-15 ENCOUNTER — Ambulatory Visit (INDEPENDENT_AMBULATORY_CARE_PROVIDER_SITE_OTHER): Payer: HMO | Admitting: Vascular Surgery

## 2021-10-15 ENCOUNTER — Encounter (INDEPENDENT_AMBULATORY_CARE_PROVIDER_SITE_OTHER): Payer: Self-pay | Admitting: Vascular Surgery

## 2021-10-15 ENCOUNTER — Ambulatory Visit (INDEPENDENT_AMBULATORY_CARE_PROVIDER_SITE_OTHER): Payer: PPO

## 2021-10-15 ENCOUNTER — Ambulatory Visit (INDEPENDENT_AMBULATORY_CARE_PROVIDER_SITE_OTHER): Payer: HMO

## 2021-10-15 VITALS — BP 137/82 | HR 98 | Resp 18 | Ht 71.0 in | Wt 242.0 lb

## 2021-10-15 DIAGNOSIS — I872 Venous insufficiency (chronic) (peripheral): Secondary | ICD-10-CM

## 2021-10-15 DIAGNOSIS — L97311 Non-pressure chronic ulcer of right ankle limited to breakdown of skin: Secondary | ICD-10-CM | POA: Diagnosis not present

## 2021-10-15 DIAGNOSIS — I10 Essential (primary) hypertension: Secondary | ICD-10-CM | POA: Diagnosis not present

## 2021-10-15 NOTE — Assessment & Plan Note (Signed)
blood pressure control important in reducing the progression of atherosclerotic disease. On appropriate oral medications.  

## 2021-10-15 NOTE — Assessment & Plan Note (Signed)
Venous study today demonstrated no obvious abnormalities.  Continue with wound care on the right leg as well as compression and elevation.  No role for intervention.

## 2021-10-15 NOTE — Progress Notes (Signed)
MRN : [id number]  Daniel Eaton. is a 81 y.o. (05-21-1940) male who presents with chief complaint of  Chief Complaint  Patient presents with   Follow-up    ultrasound  .  History of Present Illness: Patient returns today in follow up of his large right leg wound after a large hematoma that had to be evacuated surgically.  He has been getting excellent wound care and gets daily dressing changes.  He shows me a series of pictures over several weeks going back to the actual hematoma evacuation and how bad the wound looked initially.  It is quite clean now with excellent granulation tissue.  We performed arterial and venous studies today which were both normal without any obvious abnormalities.  Current Outpatient Medications  Medication Sig Dispense Refill   acetaminophen (TYLENOL) 500 MG tablet Take 500 mg by mouth every 6 (six) hours as needed for moderate pain.     Acetaminophen 500 MG capsule Take 1,000 mg by mouth See admin instructions. Bid x 21 days     albuterol (VENTOLIN HFA) 108 (90 Base) MCG/ACT inhaler Inhale 2 puffs into the lungs every 6 (six) hours as needed for wheezing or shortness of breath. (Patient taking differently: Inhale 2 puffs into the lungs See admin instructions. Qid x 3 days)     albuterol (VENTOLIN HFA) 108 (90 Base) MCG/ACT inhaler Inhale 2 puffs into the lungs every 6 (six) hours as needed for wheezing.     amiodarone (PACERONE) 200 MG tablet Take 200 mg by mouth daily.      ammonium lactate (LAC-HYDRIN) 12 % lotion Apply 1 Application topically as needed for dry skin.     apixaban (ELIQUIS) 5 MG TABS tablet Take 5 mg by mouth 2 (two) times daily.     Carboxymethylcellulose Sod PF (REFRESH CELLUVISC) 1 % GEL Apply 1 drop to eye every 12 (twelve) hours. Right eye     Fluticasone-Umeclidin-Vilant (TRELEGY ELLIPTA) 100-62.5-25 MCG/INH AEPB Inhale 1 puff into the lungs daily.     HYDROcodone-acetaminophen (NORCO/VICODIN) 5-325 MG tablet Take 1 tablet by mouth  every 8 (eight) hours as needed for moderate pain.     hydrOXYzine (ATARAX) 25 MG tablet Take 25 mg by mouth See admin instructions. Every 12 hours x 30 days for pruritis     levothyroxine (SYNTHROID) 88 MCG tablet Take 88 mcg by mouth daily.      loratadine (CLARITIN) 10 MG tablet Take 10 mg by mouth daily.     metoprolol succinate (TOPROL-XL) 50 MG 24 hr tablet Take 50 mg by mouth daily. Take with or immediately following a meal.     Multiple Vitamin (MULTI-VITAMINS) TABS Take 1 tablet by mouth daily.     Multiple Vitamins-Minerals (DECUBI-VITE PO) Take 1 capsule by mouth daily. 400 mcg -50 mg     Omega-3 Fatty Acids (OMEGA 3 PO) Take 2,000 mg by mouth daily.     Omeprazole 20 MG TBEC Take 40 mg by mouth daily.      polyethylene glycol (MIRALAX / GLYCOLAX) 17 g packet Take 17 g by mouth daily as needed. (Patient taking differently: Take 17 g by mouth daily as needed for mild constipation.) 14 each 0   potassium chloride (MICRO-K) 10 MEQ CR capsule Hold while not taking diuretics.     sertraline (ZOLOFT) 25 MG tablet Take 25 mg by mouth daily.     spironolactone (ALDACTONE) 50 MG tablet Hold pending outpatient followup due to acute kidney injury.  torsemide (DEMADEX) 20 MG tablet Hold pending outpatient followup due to acute kidney injury. 30 tablet 0   traZODone (DESYREL) 50 MG tablet Take 50 mg by mouth at bedtime.     guaiFENesin (MUCINEX) 600 MG 12 hr tablet Take 600 mg by mouth See admin instructions. Bid x 5 days (Patient not taking: Reported on 08/24/2021)     metolazone (ZAROXOLYN) 2.5 MG tablet Hold pending outpatient followup due to acute kidney injury. (Patient not taking: Reported on 03/06/2021)     predniSONE (DELTASONE) 20 MG tablet Take 40 mg by mouth See admin instructions. Qd x 5 days (Patient not taking: Reported on 08/24/2021)     No current facility-administered medications for this visit.    Past Medical History:  Diagnosis Date   A-fib ([place])    Cellulitis    CHF  (congestive heart failure) (HCC)    COPD (chronic obstructive pulmonary disease) ([place])    Difficult intubation    Hyperlipidemia    Hypertension    Hypothyroidism    Kidney disease    Sleep apnea    Stroke [facility])    Thyroid disease     Past Surgical History:  Procedure Laterality Date   CARDIAC CATHETERIZATION     CATARACT EXTRACTION W/ INTRAOCULAR LENS  IMPLANT, BILATERAL     COLONOSCOPY  2008   HERNIA REPAIR     bilateral inguinal hernia/ [place]   HERNIA REPAIR  02/02/2015   18 x 28 cm ventral light mesh placed laparoscopically.   TONSILLECTOMY     UMBILICAL HERNIA REPAIR N/A 02/02/2015   Procedure: HERNIA REPAIR UMBILICAL ADULT;  Surgeon: Robert Bellow, MD;  Location: [facility] ORS;  Service: General;  Laterality: N/A;   VENTRAL HERNIA REPAIR N/A 02/02/2015   Procedure: LAPAROSCOPIC VENTRAL HERNIA;  Surgeon: Robert Bellow, MD;  Location: [facility] ORS;  Service: General;  Laterality: N/A;   vp shunt placement  1979     Social History   Tobacco Use   Smoking status: Former   Smokeless tobacco: Never  Vaping Use   Vaping Use: Never used  Substance Use Topics   Alcohol use: No   Drug use: No      Family History  Problem Relation Age of Onset   Heart disease Mother    Alcohol abuse Father      Allergies  Allergen Reactions   Diltiazem Hcl Itching and Other (See Comments)    edema     REVIEW OF SYSTEMS (Negative unless checked)  Constitutional: '[]'$ Weight loss  '[]'$ Fever  '[]'$ Chills Cardiac: '[]'$ Chest pain   '[]'$ Chest pressure   '[x]'$ Palpitations   '[]'$ Shortness of breath when laying flat   '[]'$ Shortness of breath at rest   '[]'$ Shortness of breath with exertion. Vascular:  '[]'$ Pain in legs with walking   '[]'$ Pain in legs at rest   '[]'$ Pain in legs when laying flat   '[]'$ Claudication   '[]'$ Pain in feet when walking  '[]'$ Pain in feet at rest  '[]'$ Pain in feet when laying flat   '[]'$ History of DVT   '[]'$ Phlebitis   '[]'$ Swelling in legs   '[]'$ Varicose veins   '[x]'$ Non-healing ulcers Pulmonary:   '[]'$ Uses  home oxygen   '[]'$ Productive cough   '[]'$ Hemoptysis   '[]'$ Wheeze  '[x]'$ COPD   '[]'$ Asthma Neurologic:  '[]'$ Dizziness  '[]'$ Blackouts   '[]'$ Seizures   '[x]'$ History of stroke   '[]'$ History of TIA  '[]'$ Aphasia   '[]'$ Temporary blindness   '[]'$ Dysphagia   '[]'$ Weakness or numbness in arms   '[]'$ Weakness or numbness in legs Musculoskeletal:  '[x]'$ Arthritis   '[]'$   Joint swelling   '[x]'$ Joint pain   '[]'$ Low back pain Hematologic:  '[]'$ Easy bruising  '[]'$ Easy bleeding   '[]'$ Hypercoagulable state   '[]'$ Anemic   Gastrointestinal:  '[]'$ Blood in stool   '[]'$ Vomiting blood  '[]'$ Gastroesophageal reflux/heartburn   '[]'$ Abdominal pain Genitourinary:  '[]'$ Chronic kidney disease   '[]'$ Difficult urination  '[]'$ Frequent urination  '[]'$ Burning with urination   '[]'$ Hematuria Skin:  '[]'$ Rashes   '[x]'$ Ulcers   '[x]'$ Wounds Psychological:  '[]'$ History of anxiety   '[]'$  History of major depression.  Physical Examination  BP 137/82 (BP Location: Left Arm)   Pulse 98   Resp 18   Ht '5\' 11"'$  (1.803 m)   Wt 242 lb (109.8 kg)   BMI 33.75 kg/m  Gen:  WD/WN, NAD Head: [place]/AT, No temporalis wasting. Ear/Nose/Throat: Hearing grossly intact, nares w/o erythema or drainage Eyes: Conjunctiva clear. Sclera non-icteric Neck: Supple.  Trachea midline Pulmonary:  Good air movement, no use of accessory muscles.  Cardiac: irregular Vascular:  Vessel Right Left  Radial Palpable Palpable           Musculoskeletal: M/S 5/5 throughout.  No deformity or atrophy. Right leg wound currently dressed. Trace LE edema. Neurologic: Sensation grossly intact in extremities.  Symmetrical.  Speech is fluent.  Psychiatric: Judgment intact, Mood & affect appropriate for pt's clinical situation. Dermatologic: right leg wound currently dressed      Labs Recent Results (from the past 2160 hour(s))  Comprehensive metabolic panel     Status: Abnormal   Collection Time: 07/21/21  8:36 PM  Result Value Ref Range   Sodium 140 135 - 145 mmol/L   Potassium 4.3 3.5 - 5.1 mmol/L   Chloride 107 98 - 111 mmol/L   CO2 24 22 -  32 mmol/L   Glucose, Bld 119 (H) 70 - 99 mg/dL    Comment: Glucose reference range applies only to samples taken after fasting for at least 8 hours.   BUN 20 8 - 23 mg/dL   Creatinine, Ser 1.13 0.61 - 1.24 mg/dL   Calcium 8.6 (L) 8.9 - 10.3 mg/dL   Total Protein 5.9 (L) 6.5 - 8.1 g/dL   Albumin 2.9 (L) 3.5 - 5.0 g/dL   AST 19 15 - 41 U/L   ALT 13 0 - 44 U/L   Alkaline Phosphatase 50 38 - 126 U/L   Total Bilirubin 0.6 0.3 - 1.2 mg/dL   GFR, Estimated >60 >60 mL/min    Comment: (NOTE) Calculated using the CKD-EPI Creatinine Equation (2021)    Anion gap 9 5 - 15    Comment: Performed at [facility], [address] [place], [place] [address]  CBC with Differential     Status: Abnormal   Collection Time: 07/21/21  8:36 PM  Result Value Ref Range   WBC 10.6 (H) 4.0 - 10.5 K/uL   RBC 3.39 (L) 4.22 - 5.81 MIL/uL   Hemoglobin 8.5 (L) 13.0 - 17.0 g/dL   HCT 30.1 (L) 39.0 - 52.0 %   MCV 88.8 80.0 - 100.0 fL   MCH 25.1 (L) 26.0 - 34.0 pg   MCHC 28.2 (L) 30.0 - 36.0 g/dL   RDW 20.6 (H) 11.5 - 15.5 %   Platelets 345 150 - 400 K/uL   nRBC 0.2 0.0 - 0.2 %   Neutrophils Relative % 73 %   Neutro Abs 7.9 (H) 1.7 - 7.7 K/uL   Lymphocytes Relative 16 %   Lymphs Abs 1.7 0.7 - 4.0 K/uL   Monocytes Relative 8 %   Monocytes  Absolute 0.8 0.1 - 1.0 K/uL   Eosinophils Relative 1 %   Eosinophils Absolute 0.1 0.0 - 0.5 K/uL   Basophils Relative 1 %   Basophils Absolute 0.1 0.0 - 0.1 K/uL   Immature Granulocytes 1 %   Abs Immature Granulocytes 0.06 0.00 - 0.07 K/uL    Comment: Performed at [facility], [address] [place], [place] [address]    Radiology No results found.  Assessment/Plan  Essential hypertension blood pressure control important in reducing the progression of atherosclerotic disease. On appropriate oral medications.   Venous stasis dermatitis of both lower extremities Venous study today demonstrated no obvious abnormalities.  Continue with wound care  on the right leg as well as compression and elevation.  No role for intervention.  Lower limb ulcer, ankle, right, limited to breakdown of skin ([place]) His arterial and venous studies today were normal without evidence of arterial insufficiency or significant venous disease requiring treatment.  His wound has progressed nicely and has excellent granulation tissue.  He shows me a series of pictures over the weeks of treatment and that has made a significant improvement.  It is still sizable, and consideration for a synthetic skin graft can be given but he says his treating physician is not interested in that and I will defer that judgment to him.  I will see the patient back as needed.    Leotis Pain, MD  10/15/2021 11:00 AM    This note was created with Dragon medical transcription system.  Any errors from dictation are purely unintentional

## 2021-10-15 NOTE — Assessment & Plan Note (Signed)
His arterial and venous studies today were normal without evidence of arterial insufficiency or significant venous disease requiring treatment.  His wound has progressed nicely and has excellent granulation tissue.  He shows me a series of pictures over the weeks of treatment and that has made a significant improvement.  It is still sizable, and consideration for a synthetic skin graft can be given but he says his treating physician is not interested in that and I will defer that judgment to him.  I will see the patient back as needed.

## 2021-12-09 ENCOUNTER — Encounter: Payer: Self-pay | Admitting: Emergency Medicine

## 2021-12-09 ENCOUNTER — Other Ambulatory Visit: Payer: Self-pay

## 2021-12-09 ENCOUNTER — Emergency Department
Admission: EM | Admit: 2021-12-09 | Discharge: 2021-12-10 | Disposition: A | Discharge status: Home / Self Care | Payer: HMO | Attending: Emergency Medicine | Admitting: Emergency Medicine

## 2021-12-09 DIAGNOSIS — D649 Anemia, unspecified: Secondary | ICD-10-CM | POA: Insufficient documentation

## 2021-12-09 DIAGNOSIS — J449 Chronic obstructive pulmonary disease, unspecified: Secondary | ICD-10-CM | POA: Diagnosis not present

## 2021-12-09 DIAGNOSIS — Z7901 Long term (current) use of anticoagulants: Secondary | ICD-10-CM | POA: Diagnosis not present

## 2021-12-09 DIAGNOSIS — I509 Heart failure, unspecified: Secondary | ICD-10-CM | POA: Insufficient documentation

## 2021-12-09 DIAGNOSIS — R799 Abnormal finding of blood chemistry, unspecified: Secondary | ICD-10-CM | POA: Diagnosis present

## 2021-12-09 DIAGNOSIS — I4891 Unspecified atrial fibrillation: Secondary | ICD-10-CM | POA: Insufficient documentation

## 2021-12-09 DIAGNOSIS — I130 Hypertensive heart and chronic kidney disease with heart failure and stage 1 through stage 4 chronic kidney disease, or unspecified chronic kidney disease: Secondary | ICD-10-CM | POA: Insufficient documentation

## 2021-12-09 DIAGNOSIS — N189 Chronic kidney disease, unspecified: Secondary | ICD-10-CM | POA: Diagnosis not present

## 2021-12-09 LAB — COMPREHENSIVE METABOLIC PANEL
ALT: 11 U/L (ref 0–44)
AST: 17 U/L (ref 15–41)
Albumin: 2.4 g/dL — ABNORMAL LOW (ref 3.5–5.0)
Alkaline Phosphatase: 70 U/L (ref 38–126)
Anion gap: 9 (ref 5–15)
BUN: 17 mg/dL (ref 8–23)
CO2: 25 mmol/L (ref 22–32)
Calcium: 8 mg/dL — ABNORMAL LOW (ref 8.9–10.3)
Chloride: 105 mmol/L (ref 98–111)
Creatinine, Ser: 0.87 mg/dL (ref 0.61–1.24)
GFR, Estimated: >60 mL/min (ref >60)
Glucose, Bld: 94 mg/dL (ref 70–99)
Potassium: 3.8 mmol/L (ref 3.5–5.1)
Sodium: 139 mmol/L (ref 135–145)
Total Bilirubin: 0.6 mg/dL (ref 0.3–1.2)
Total Protein: 5.6 g/dL — ABNORMAL LOW (ref 6.5–8.1)

## 2021-12-09 LAB — CBC
HCT: 25.1 % — ABNORMAL LOW (ref 39.0–52.0)
Hemoglobin: 6.4 g/dL — ABNORMAL LOW (ref 13.0–17.0)
MCH: 21.3 pg — ABNORMAL LOW (ref 26.0–34.0)
MCHC: 25.5 g/dL — ABNORMAL LOW (ref 30.0–36.0)
MCV: 83.7 fL (ref 80.0–100.0)
Platelets: 328 10*3/uL (ref 150–400)
RBC: 3 MIL/uL — ABNORMAL LOW (ref 4.22–5.81)
RDW: 21.4 % — ABNORMAL HIGH (ref 11.5–15.5)
WBC: 7.9 10*3/uL (ref 4.0–10.5)
nRBC: 0.6 % — ABNORMAL HIGH (ref 0.0–0.2)

## 2021-12-09 LAB — PREPARE RBC (CROSSMATCH)

## 2021-12-09 LAB — IRON AND TIBC
Iron: 16 ug/dL — ABNORMAL LOW (ref 45–182)
Saturation Ratios: 5 % — ABNORMAL LOW (ref 17.9–39.5)
TIBC: 354 ug/dL (ref 250–450)
UIBC: 338 ug/dL

## 2021-12-09 LAB — FERRITIN: Ferritin: 11 ng/mL — ABNORMAL LOW (ref 24–336)

## 2021-12-09 LAB — ABO/RH: ABO/RH(D): A POS

## 2021-12-09 MED ORDER — SODIUM CHLORIDE 0.9 % IV SOLN
10.0000 mL/h | Freq: Once | INTRAVENOUS | Status: AC
  Administered 2021-12-09: 10 mL/h via INTRAVENOUS

## 2021-12-09 MED ORDER — FERROUS SULFATE 325 (65 FE) MG PO TABS: 325.0000 mg | ORAL_TABLET | Freq: Every day | ORAL | 3 refills | Status: DC

## 2021-12-09 NOTE — ED Notes (Signed)
First nurse note-pt brought in via [facility] from [facility] with hgb of 7 per ems   iv in place.  Pt alert

## 2021-12-09 NOTE — ED Notes (Signed)
Wound to right lower extremity. Per pt wound dressed today. MD and RN assessed wound. RN reapplied ABD, Kerlix, and ACE wrap.

## 2021-12-09 NOTE — ED Provider Notes (Addendum)
[facility] Provider Note    Event Date/Time   First MD Initiated Contact with Patient 12/09/21 1718     (approximate)   History   Chief Complaint Abnormal Labs   HPI  Daniel Eaton. is a 81 y.o. male with past medical history of hypertension, hyperlipidemia, atrial fibrillation on Eliquis, COPD, CHF, CKD, and stroke who presents to the ED complaining of abnormal labs.  Patient reports that he was told by staff at his nursing facility that his hemoglobin was low and that he needed a blood transfusion.  He currently states that he feels fine and denies any complaints, specifically denies any lightheadedness, dizziness, chest pain, or shortness of breath.  He has not noticed any bleeding recently, but states he does not typically look at his stool.  He has a chronic wound to his right lower extremity from prior hematoma, states this dressing was changed earlier today with minimal bleeding and he was told there was no signs of infection.  He does not take any blood thinners.     Physical Exam   Triage Vital Signs: ED Triage Vitals  Enc Vitals Group     BP 12/09/21 1651 123/63     Pulse Rate 12/09/21 1651 99     Resp 12/09/21 1651 18     Temp 12/09/21 1651 98.5 F (36.9 C)     Temp Source 12/09/21 1651 Oral     SpO2 12/09/21 1651 94 %     Weight --      Height --      Head Circumference --      Peak Flow --      Pain Score 12/09/21 1650 0     Pain Loc --      Pain Edu? --      Excl. in [place]? --     Most recent vital signs: Vitals:   12/09/21 2006 12/09/21 2219  BP: 132/62 118/65  Pulse: 91 81  Resp: 16 18  Temp: 98.1 F (36.7 C) 98 F (36.7 C)  SpO2: 100% 100%    Constitutional: Alert and oriented. Eyes: Conjunctivae are normal. Head: Atraumatic. Nose: No congestion/rhinnorhea. Mouth/Throat: Mucous membranes are moist.  Cardiovascular: Normal rate, regular rhythm. Grossly normal heart sounds.  2+ radial pulses  bilaterally. Respiratory: Normal respiratory effort.  No retractions. Lungs CTAB. Gastrointestinal: Soft and nontender. No distention.  Rectal exam with light brown guaiac-negative stool. Musculoskeletal: No lower extremity tenderness nor edema.  Healing wound to right lower leg with no erythema, warmth, or drainage.  Pressure injury noted to right upper back with no erythema, warmth, or drainage noted. Neurologic:  Normal speech and language. No gross focal neurologic deficits are appreciated.    ED Results / Procedures / Treatments   Labs (all labs ordered are listed, but only abnormal results are displayed) Labs Reviewed  COMPREHENSIVE METABOLIC PANEL - Abnormal; Notable for the following components:      Result Value   Calcium 8.0 (*)    Total Protein 5.6 (*)    Albumin 2.4 (*)    All other components within normal limits  CBC - Abnormal; Notable for the following components:   RBC 3.00 (*)    Hemoglobin 6.4 (*)    HCT 25.1 (*)    MCH 21.3 (*)    MCHC 25.5 (*)    RDW 21.4 (*)    nRBC 0.6 (*)    All other components within normal limits  IRON AND TIBC -  Abnormal; Notable for the following components:   Iron 16 (*)    Saturation Ratios 5 (*)    All other components within normal limits  FERRITIN - Abnormal; Notable for the following components:   Ferritin 11 (*)    All other components within normal limits  RETICULOCYTES  TYPE AND SCREEN  PREPARE RBC (CROSSMATCH)  ABO/RH    PROCEDURES:  Critical Care performed: Yes, see critical care procedure note(s)  .Critical Care  Performed by: Blake Divine, MD Authorized by: Blake Divine, MD   Critical care provider statement:    Critical care time (minutes):  30   Critical care time was exclusive of:  Separately billable procedures and treating other patients and teaching time   Critical care was necessary to treat or prevent imminent or life-threatening deterioration of the following conditions:  Circulatory  failure   Critical care was time spent personally by me on the following activities:  Development of treatment plan with patient or surrogate, discussions with consultants, evaluation of patient's response to treatment, examination of patient, ordering and review of laboratory studies, ordering and review of radiographic studies, ordering and performing treatments and interventions, pulse oximetry, re-evaluation of patient's condition and review of old charts   I assumed direction of critical care for this patient from another provider in my specialty: no      MEDICATIONS ORDERED IN ED: Medications  0.9 %  sodium chloride infusion (has no administration in time range)     IMPRESSION / MDM / ASSESSMENT AND PLAN / ED COURSE  I reviewed the triage vital signs and the nursing notes.                              81 y.o. male with past medical history of hypertension, hyperlipidemia, atrial fibrillation on Eliquis, CHF, COPD, CKD, stroke who presents to the ED complaining of low hemoglobin found on routine lab work at his nursing facility.  Patient's presentation is most consistent with acute presentation with potential threat to life or bodily function.  Differential diagnosis includes, but is not limited to, anemia, GI bleeding, anemia of chronic disease, iron deficiency anemia, chronic kidney disease, electrolyte abnormality.  Patient chronically ill but nontoxic-appearing, vital signs are unremarkable and he denies any complaints.  He has not noticed any recent bleeding and his stool is guaiac negative, does have chronic wound to his right lower leg that shows no signs of infection or bleeding.  He also has developing pressure injury to his upper back that shows no signs of infection or bleeding.  Hemoglobin noted to be 6 here in the ED, labs consistent with normocytic anemia but we will check iron studies.  Plan to transfuse 1 unit PRBCs, remainder of labs are reassuring with no significant  electrolyte abnormality or AKI, LFTs are unremarkable.  Anemia appears to be acute on chronic and with no evidence of active bleeding, patient will be appropriate for outpatient follow-up with hematology following transfusion.  Patient completed transfusion without issue and is appropriate for discharge back to nursing facility with prescription for iron as well as hematology follow-up.      FINAL CLINICAL IMPRESSION(S) / ED DIAGNOSES   Final diagnoses:  Anemia, unspecified type     Rx / DC Orders   ED Discharge Orders          Ordered    ferrous sulfate 325 (65 FE) MG tablet  Daily  12/09/21 2300             Note:  This document was prepared using Dragon voice recognition software and may include unintentional dictation errors.   Blake Divine, MD 12/09/21 7276    Blake Divine, MD 12/10/21 0000

## 2021-12-09 NOTE — ED Triage Notes (Signed)
Pt from facility who was told this morning his hemoglobin was low.  No complaints.

## 2021-12-09 NOTE — ED Notes (Signed)
Spoke with Makayla LPN at facility @ [phone] to give report. As per [facility] LPN pt okay to go back to facility.

## 2021-12-09 NOTE — ED Notes (Signed)
Unit of RBC started at 123m/hr.   TKuwaitsandwich and sprite served to pt.

## 2021-12-10 LAB — TYPE AND SCREEN
ABO/RH(D): A POS
Antibody Screen: NEGATIVE
Unit division: 0

## 2021-12-10 LAB — BPAM RBC
Blood Product Expiration Date: 202311082359
ISSUE DATE / TIME: 202310261936
Unit Type and Rh: 6200

## 2021-12-10 NOTE — ED Notes (Signed)
Pt picked up by facility transportation. Pt aaox4 verbalized understanding of discharge instructions. IV removed.

## 2021-12-13 ENCOUNTER — Encounter (INDEPENDENT_AMBULATORY_CARE_PROVIDER_SITE_OTHER): Payer: Self-pay

## 2022-02-16 DIAGNOSIS — J90 Pleural effusion, not elsewhere classified: Secondary | ICD-10-CM | POA: Diagnosis not present

## 2022-02-16 DIAGNOSIS — I872 Venous insufficiency (chronic) (peripheral): Secondary | ICD-10-CM | POA: Diagnosis not present

## 2022-02-16 DIAGNOSIS — M6281 Muscle weakness (generalized): Secondary | ICD-10-CM | POA: Diagnosis not present

## 2022-02-16 DIAGNOSIS — L97819 Non-pressure chronic ulcer of other part of right lower leg with unspecified severity: Secondary | ICD-10-CM | POA: Diagnosis not present

## 2022-02-16 DIAGNOSIS — I509 Heart failure, unspecified: Secondary | ICD-10-CM | POA: Diagnosis not present

## 2022-02-18 DIAGNOSIS — S81801D Unspecified open wound, right lower leg, subsequent encounter: Secondary | ICD-10-CM | POA: Diagnosis not present

## 2022-02-18 DIAGNOSIS — S81802D Unspecified open wound, left lower leg, subsequent encounter: Secondary | ICD-10-CM | POA: Diagnosis not present

## 2022-02-23 DIAGNOSIS — L97819 Non-pressure chronic ulcer of other part of right lower leg with unspecified severity: Secondary | ICD-10-CM | POA: Diagnosis not present

## 2022-02-23 DIAGNOSIS — S31010A Laceration without foreign body of lower back and pelvis without penetration into retroperitoneum, initial encounter: Secondary | ICD-10-CM | POA: Diagnosis not present

## 2022-02-23 DIAGNOSIS — M6281 Muscle weakness (generalized): Secondary | ICD-10-CM | POA: Diagnosis not present

## 2022-02-23 DIAGNOSIS — I509 Heart failure, unspecified: Secondary | ICD-10-CM | POA: Diagnosis not present

## 2022-03-02 DIAGNOSIS — I509 Heart failure, unspecified: Secondary | ICD-10-CM | POA: Diagnosis not present

## 2022-03-02 DIAGNOSIS — L97819 Non-pressure chronic ulcer of other part of right lower leg with unspecified severity: Secondary | ICD-10-CM | POA: Diagnosis not present

## 2022-03-02 DIAGNOSIS — M6281 Muscle weakness (generalized): Secondary | ICD-10-CM | POA: Diagnosis not present

## 2022-03-02 DIAGNOSIS — R2232 Localized swelling, mass and lump, left upper limb: Secondary | ICD-10-CM | POA: Diagnosis not present

## 2022-03-02 DIAGNOSIS — S31010A Laceration without foreign body of lower back and pelvis without penetration into retroperitoneum, initial encounter: Secondary | ICD-10-CM | POA: Diagnosis not present

## 2022-03-09 DIAGNOSIS — L97819 Non-pressure chronic ulcer of other part of right lower leg with unspecified severity: Secondary | ICD-10-CM | POA: Diagnosis not present

## 2022-03-09 DIAGNOSIS — M6281 Muscle weakness (generalized): Secondary | ICD-10-CM | POA: Diagnosis not present

## 2022-03-09 DIAGNOSIS — I872 Venous insufficiency (chronic) (peripheral): Secondary | ICD-10-CM | POA: Diagnosis not present

## 2022-03-09 DIAGNOSIS — I509 Heart failure, unspecified: Secondary | ICD-10-CM | POA: Diagnosis not present

## 2022-03-16 DIAGNOSIS — M6281 Muscle weakness (generalized): Secondary | ICD-10-CM | POA: Diagnosis not present

## 2022-03-16 DIAGNOSIS — L98499 Non-pressure chronic ulcer of skin of other sites with unspecified severity: Secondary | ICD-10-CM | POA: Diagnosis not present

## 2022-03-16 DIAGNOSIS — I509 Heart failure, unspecified: Secondary | ICD-10-CM | POA: Diagnosis not present

## 2022-03-23 DIAGNOSIS — S31010A Laceration without foreign body of lower back and pelvis without penetration into retroperitoneum, initial encounter: Secondary | ICD-10-CM | POA: Diagnosis not present

## 2022-03-23 DIAGNOSIS — M6281 Muscle weakness (generalized): Secondary | ICD-10-CM | POA: Diagnosis not present

## 2022-03-23 DIAGNOSIS — I509 Heart failure, unspecified: Secondary | ICD-10-CM | POA: Diagnosis not present

## 2022-03-23 DIAGNOSIS — L97819 Non-pressure chronic ulcer of other part of right lower leg with unspecified severity: Secondary | ICD-10-CM | POA: Diagnosis not present

## 2022-03-24 DIAGNOSIS — S21212D Laceration without foreign body of left back wall of thorax without penetration into thoracic cavity, subsequent encounter: Secondary | ICD-10-CM | POA: Diagnosis not present

## 2022-03-24 DIAGNOSIS — S51001D Unspecified open wound of right elbow, subsequent encounter: Secondary | ICD-10-CM | POA: Diagnosis not present

## 2022-03-24 DIAGNOSIS — S81801D Unspecified open wound, right lower leg, subsequent encounter: Secondary | ICD-10-CM | POA: Diagnosis not present

## 2022-03-28 DIAGNOSIS — E1142 Type 2 diabetes mellitus with diabetic polyneuropathy: Secondary | ICD-10-CM | POA: Diagnosis not present

## 2022-03-28 DIAGNOSIS — B351 Tinea unguium: Secondary | ICD-10-CM | POA: Diagnosis not present

## 2022-03-29 DIAGNOSIS — L97819 Non-pressure chronic ulcer of other part of right lower leg with unspecified severity: Secondary | ICD-10-CM | POA: Diagnosis not present

## 2022-03-29 DIAGNOSIS — M6281 Muscle weakness (generalized): Secondary | ICD-10-CM | POA: Diagnosis not present

## 2022-03-29 DIAGNOSIS — I872 Venous insufficiency (chronic) (peripheral): Secondary | ICD-10-CM | POA: Diagnosis not present

## 2022-03-29 DIAGNOSIS — I509 Heart failure, unspecified: Secondary | ICD-10-CM | POA: Diagnosis not present

## 2022-04-05 DIAGNOSIS — M6281 Muscle weakness (generalized): Secondary | ICD-10-CM | POA: Diagnosis not present

## 2022-04-05 DIAGNOSIS — I509 Heart failure, unspecified: Secondary | ICD-10-CM | POA: Diagnosis not present

## 2022-04-05 DIAGNOSIS — S31010A Laceration without foreign body of lower back and pelvis without penetration into retroperitoneum, initial encounter: Secondary | ICD-10-CM | POA: Diagnosis not present

## 2022-04-12 DIAGNOSIS — J449 Chronic obstructive pulmonary disease, unspecified: Secondary | ICD-10-CM | POA: Diagnosis not present

## 2022-04-12 DIAGNOSIS — G4733 Obstructive sleep apnea (adult) (pediatric): Secondary | ICD-10-CM | POA: Diagnosis not present

## 2022-04-12 DIAGNOSIS — L98419 Non-pressure chronic ulcer of buttock with unspecified severity: Secondary | ICD-10-CM | POA: Diagnosis not present

## 2022-04-12 DIAGNOSIS — M6281 Muscle weakness (generalized): Secondary | ICD-10-CM | POA: Diagnosis not present

## 2022-04-12 DIAGNOSIS — I509 Heart failure, unspecified: Secondary | ICD-10-CM | POA: Diagnosis not present

## 2022-04-19 DIAGNOSIS — I509 Heart failure, unspecified: Secondary | ICD-10-CM | POA: Diagnosis not present

## 2022-04-19 DIAGNOSIS — L97819 Non-pressure chronic ulcer of other part of right lower leg with unspecified severity: Secondary | ICD-10-CM | POA: Diagnosis not present

## 2022-04-19 DIAGNOSIS — S81801D Unspecified open wound, right lower leg, subsequent encounter: Secondary | ICD-10-CM | POA: Diagnosis not present

## 2022-04-19 DIAGNOSIS — M6281 Muscle weakness (generalized): Secondary | ICD-10-CM | POA: Diagnosis not present

## 2022-04-19 DIAGNOSIS — I872 Venous insufficiency (chronic) (peripheral): Secondary | ICD-10-CM | POA: Diagnosis not present

## 2022-04-19 DIAGNOSIS — D735 Infarction of spleen: Secondary | ICD-10-CM | POA: Diagnosis not present

## 2022-04-26 ENCOUNTER — Other Ambulatory Visit: Payer: Self-pay

## 2022-04-26 ENCOUNTER — Emergency Department
Admission: EM | Admit: 2022-04-26 | Discharge: 2022-04-27 | Disposition: A | Discharge status: Skilled Nursing Facility | Payer: PPO | Attending: Emergency Medicine | Admitting: Emergency Medicine

## 2022-04-26 DIAGNOSIS — L98419 Non-pressure chronic ulcer of buttock with unspecified severity: Secondary | ICD-10-CM | POA: Diagnosis not present

## 2022-04-26 DIAGNOSIS — M2041 Other hammer toe(s) (acquired), right foot: Secondary | ICD-10-CM | POA: Diagnosis not present

## 2022-04-26 DIAGNOSIS — D649 Anemia, unspecified: Secondary | ICD-10-CM | POA: Insufficient documentation

## 2022-04-26 DIAGNOSIS — N1831 Chronic kidney disease, stage 3a: Secondary | ICD-10-CM | POA: Diagnosis not present

## 2022-04-26 DIAGNOSIS — M6281 Muscle weakness (generalized): Secondary | ICD-10-CM | POA: Diagnosis not present

## 2022-04-26 DIAGNOSIS — M2042 Other hammer toe(s) (acquired), left foot: Secondary | ICD-10-CM | POA: Diagnosis not present

## 2022-04-26 DIAGNOSIS — I739 Peripheral vascular disease, unspecified: Secondary | ICD-10-CM | POA: Diagnosis not present

## 2022-04-26 DIAGNOSIS — D638 Anemia in other chronic diseases classified elsewhere: Secondary | ICD-10-CM | POA: Diagnosis not present

## 2022-04-26 DIAGNOSIS — S31010A Laceration without foreign body of lower back and pelvis without penetration into retroperitoneum, initial encounter: Secondary | ICD-10-CM | POA: Diagnosis not present

## 2022-04-26 DIAGNOSIS — R799 Abnormal finding of blood chemistry, unspecified: Secondary | ICD-10-CM | POA: Diagnosis not present

## 2022-04-26 DIAGNOSIS — I509 Heart failure, unspecified: Secondary | ICD-10-CM | POA: Diagnosis not present

## 2022-04-26 DIAGNOSIS — L603 Nail dystrophy: Secondary | ICD-10-CM | POA: Diagnosis not present

## 2022-04-26 LAB — CBC WITH DIFFERENTIAL/PLATELET
Abs Immature Granulocytes: 0.34 10*3/uL — ABNORMAL HIGH (ref 0.00–0.07)
Basophils Absolute: 0 10*3/uL (ref 0.0–0.1)
Basophils Relative: 0 %
Eosinophils Absolute: 0.2 10*3/uL (ref 0.0–0.5)
Eosinophils Relative: 2 %
HCT: 23.2 % — ABNORMAL LOW (ref 39.0–52.0)
Hemoglobin: 6.9 g/dL — ABNORMAL LOW (ref 13.0–17.0)
Immature Granulocytes: 3 %
Lymphocytes Relative: 19 %
Lymphs Abs: 1.9 10*3/uL (ref 0.7–4.0)
MCH: 32.2 pg (ref 26.0–34.0)
MCHC: 29.7 g/dL — ABNORMAL LOW (ref 30.0–36.0)
MCV: 108.4 fL — ABNORMAL HIGH (ref 80.0–100.0)
Monocytes Absolute: 0.7 10*3/uL (ref 0.1–1.0)
Monocytes Relative: 7 %
Neutro Abs: 7.1 10*3/uL (ref 1.7–7.7)
Neutrophils Relative %: 69 %
Platelets: 494 10*3/uL — ABNORMAL HIGH (ref 150–400)
RBC: 2.14 MIL/uL — ABNORMAL LOW (ref 4.22–5.81)
RDW: 19.1 % — ABNORMAL HIGH (ref 11.5–15.5)
WBC: 10.2 10*3/uL (ref 4.0–10.5)
nRBC: 2.4 % — ABNORMAL HIGH (ref 0.0–0.2)

## 2022-04-26 LAB — COMPREHENSIVE METABOLIC PANEL
ALT: 15 U/L (ref 0–44)
AST: 24 U/L (ref 15–41)
Albumin: 2.1 g/dL — ABNORMAL LOW (ref 3.5–5.0)
Alkaline Phosphatase: 98 U/L (ref 38–126)
Anion gap: 9 (ref 5–15)
BUN: 20 mg/dL (ref 8–23)
CO2: 22 mmol/L (ref 22–32)
Calcium: 7.8 mg/dL — ABNORMAL LOW (ref 8.9–10.3)
Chloride: 100 mmol/L (ref 98–111)
Creatinine, Ser: 1.25 mg/dL — ABNORMAL HIGH (ref 0.61–1.24)
GFR, Estimated: 58 mL/min — ABNORMAL LOW (ref >60)
Glucose, Bld: 112 mg/dL — ABNORMAL HIGH (ref 70–99)
Potassium: 4.2 mmol/L (ref 3.5–5.1)
Sodium: 131 mmol/L — ABNORMAL LOW (ref 135–145)
Total Bilirubin: 0.8 mg/dL (ref 0.3–1.2)
Total Protein: 5.7 g/dL — ABNORMAL LOW (ref 6.5–8.1)

## 2022-04-26 LAB — PREPARE RBC (CROSSMATCH)

## 2022-04-26 LAB — TYPE AND SCREEN

## 2022-04-26 MED ORDER — SODIUM CHLORIDE 0.9 % IV SOLN
10.0000 mL/h | Freq: Once | INTRAVENOUS | Status: AC
  Administered 2022-04-27: 10 mL/h via INTRAVENOUS

## 2022-04-26 NOTE — ED Provider Notes (Signed)
[facility] Provider Note   Event Date/Time   First MD Initiated Contact with Patient 04/26/22 2116     (approximate) History  Abnormal Lab  HPI Daniel Eaton. is a 82 y.o. male with a history of anemia who presents after having routine labs drawn and showed a hemoglobin of 6.8.  Patient denies any symptoms at this time.  Requests transfusion and discharge ROS: Patient currently denies any vision changes, tinnitus, difficulty speaking, facial droop, sore throat, chest pain, shortness of breath, abdominal pain, nausea/vomiting/diarrhea, dysuria, or weakness/numbness/paresthesias in any extremity   Physical Exam  Triage Vital Signs: ED Triage Vitals  Enc Vitals Group     BP 04/26/22 2014 119/61     Pulse Rate 04/26/22 2014 91     Resp 04/26/22 2014 18     Temp 04/26/22 2014 97.7 F (36.5 C)     Temp Source 04/26/22 2014 Oral     SpO2 04/26/22 2014 94 %     Weight 04/26/22 2001 240 lb 4.8 oz (109 kg)     Height 04/26/22 2001 '5\' 11"'$  (1.803 m)     Head Circumference --      Peak Flow --      Pain Score 04/26/22 2000 3     Pain Loc --      Pain Edu? --      Excl. in [place]? --    Most recent vital signs: Vitals:   04/26/22 2014 04/26/22 2300  BP: 119/61 125/67  Pulse: 91 84  Resp: 18 16  Temp: 97.7 F (36.5 C) 98.2 F (36.8 C)  SpO2: 94% 97%   General: Awake, oriented x4. CV:  Good peripheral perfusion.  Resp:  Normal effort.  Abd:  No distention.  Other:  Disheveled elderly overweight Caucasian male laying in bed in no acute distress.  Generalized pallor ED Results / Procedures / Treatments  Labs (all labs ordered are listed, but only abnormal results are displayed) Labs Reviewed  CBC WITH DIFFERENTIAL/PLATELET - Abnormal; Notable for the following components:      Result Value   RBC 2.14 (*)    Hemoglobin 6.9 (*)    HCT 23.2 (*)    MCV 108.4 (*)    MCHC 29.7 (*)    RDW 19.1 (*)    Platelets 494 (*)    nRBC 2.4 (*)    Abs Immature  Granulocytes 0.34 (*)    All other components within normal limits  COMPREHENSIVE METABOLIC PANEL - Abnormal; Notable for the following components:   Sodium 131 (*)    Glucose, Bld 112 (*)    Creatinine, Ser 1.25 (*)    Calcium 7.8 (*)    Total Protein 5.7 (*)    Albumin 2.1 (*)    GFR, Estimated 58 (*)    All other components within normal limits  TYPE AND SCREEN  TYPE AND SCREEN  PREPARE RBC (CROSSMATCH)  PROCEDURES: Critical Care performed: No .1-3 Lead EKG Interpretation  Performed by: Naaman Plummer, MD Authorized by: Naaman Plummer, MD     Interpretation: normal     ECG rate:  71   ECG rate assessment: normal     Rhythm: sinus rhythm     Ectopy: none     Conduction: normal    MEDICATIONS ORDERED IN ED: Medications  0.9 %  sodium chloride infusion (has no administration in time range)   IMPRESSION / MDM / ASSESSMENT AND PLAN / ED COURSE  I reviewed  the triage vital signs and the nursing notes.                             The patient is on the cardiac monitor to evaluate for evidence of arrhythmia and/or significant heart rate changes. Patient's presentation is most consistent with acute presentation with potential threat to life or bodily function. Patient presents for symptomatic anemia secondary to chronic anemia. Patient with known cause of bleeding and follow up scheduled. Given 1 units of blood with resolution of symptoms afterwards. Patient had no reaction to blood transfusion. Patient feels well on discharge with plan to follow up with PMD.   FINAL CLINICAL IMPRESSION(S) / ED DIAGNOSES   Final diagnoses:  Chronic anemia   Rx / DC Orders   ED Discharge Orders     None      Note:  This document was prepared using Dragon voice recognition software and may include unintentional dictation errors.   Naaman Plummer, MD 04/26/22 [phone]

## 2022-04-26 NOTE — ED Triage Notes (Signed)
BIB [facility] from [facility] for Hgb 6.8. no known active bleeding. Reports found when running routine labs. Pt alert and oriented. Denies pain. Pt breathing unlabored speaking in full sentences. Symmetric chest rise and fall noted. Reports hx of low hgb with need for transfusion. Pt also denies active bleeding.

## 2022-04-27 DIAGNOSIS — Z7401 Bed confinement status: Secondary | ICD-10-CM | POA: Diagnosis not present

## 2022-04-27 DIAGNOSIS — R531 Weakness: Secondary | ICD-10-CM | POA: Diagnosis not present

## 2022-04-27 DIAGNOSIS — D649 Anemia, unspecified: Secondary | ICD-10-CM | POA: Diagnosis not present

## 2022-04-27 NOTE — ED Notes (Signed)
called to [facility] for transport to facility/[facility]/rep:madelyn.Marland Kitchen

## 2022-04-29 DIAGNOSIS — E871 Hypo-osmolality and hyponatremia: Secondary | ICD-10-CM | POA: Diagnosis not present

## 2022-04-29 LAB — BPAM RBC
Blood Product Expiration Date: 202403172359
ISSUE DATE / TIME: 202403122256
Unit Type and Rh: 600

## 2022-04-29 LAB — TYPE AND SCREEN
ABO/RH(D): A POS
Antibody Screen: NEGATIVE
Unit division: 0

## 2022-05-10 DIAGNOSIS — M6281 Muscle weakness (generalized): Secondary | ICD-10-CM | POA: Diagnosis not present

## 2022-05-10 DIAGNOSIS — I509 Heart failure, unspecified: Secondary | ICD-10-CM | POA: Diagnosis not present

## 2022-05-10 DIAGNOSIS — S31010A Laceration without foreign body of lower back and pelvis without penetration into retroperitoneum, initial encounter: Secondary | ICD-10-CM | POA: Diagnosis not present

## 2022-05-12 ENCOUNTER — Other Ambulatory Visit: Payer: Self-pay

## 2022-05-12 ENCOUNTER — Emergency Department
Admission: EM | Admit: 2022-05-12 | Discharge: 2022-05-12 | Disposition: A | Discharge status: Skilled Nursing Facility | Payer: PPO | Attending: Emergency Medicine | Admitting: Emergency Medicine

## 2022-05-12 ENCOUNTER — Emergency Department: Payer: PPO

## 2022-05-12 DIAGNOSIS — R2241 Localized swelling, mass and lump, right lower limb: Secondary | ICD-10-CM | POA: Insufficient documentation

## 2022-05-12 DIAGNOSIS — M7989 Other specified soft tissue disorders: Secondary | ICD-10-CM | POA: Diagnosis not present

## 2022-05-12 DIAGNOSIS — I110 Hypertensive heart disease with heart failure: Secondary | ICD-10-CM | POA: Diagnosis not present

## 2022-05-12 DIAGNOSIS — M25561 Pain in right knee: Secondary | ICD-10-CM | POA: Diagnosis not present

## 2022-05-12 DIAGNOSIS — I959 Hypotension, unspecified: Secondary | ICD-10-CM | POA: Diagnosis not present

## 2022-05-12 DIAGNOSIS — J449 Chronic obstructive pulmonary disease, unspecified: Secondary | ICD-10-CM | POA: Insufficient documentation

## 2022-05-12 DIAGNOSIS — R2240 Localized swelling, mass and lump, unspecified lower limb: Secondary | ICD-10-CM | POA: Diagnosis not present

## 2022-05-12 DIAGNOSIS — R609 Edema, unspecified: Secondary | ICD-10-CM | POA: Diagnosis not present

## 2022-05-12 DIAGNOSIS — R600 Localized edema: Secondary | ICD-10-CM | POA: Diagnosis not present

## 2022-05-12 DIAGNOSIS — I509 Heart failure, unspecified: Secondary | ICD-10-CM | POA: Insufficient documentation

## 2022-05-12 LAB — COMPREHENSIVE METABOLIC PANEL
ALT: 10 U/L (ref 0–44)
AST: 21 U/L (ref 15–41)
Albumin: 2.7 g/dL — ABNORMAL LOW (ref 3.5–5.0)
Alkaline Phosphatase: 67 U/L (ref 38–126)
Anion gap: 10 (ref 5–15)
BUN: 18 mg/dL (ref 8–23)
CO2: 26 mmol/L (ref 22–32)
Calcium: 8.3 mg/dL — ABNORMAL LOW (ref 8.9–10.3)
Chloride: 102 mmol/L (ref 98–111)
Creatinine, Ser: 1.13 mg/dL (ref 0.61–1.24)
GFR, Estimated: >60 mL/min (ref >60)
Glucose, Bld: 93 mg/dL (ref 70–99)
Potassium: 4.1 mmol/L (ref 3.5–5.1)
Sodium: 138 mmol/L (ref 135–145)
Total Bilirubin: 0.7 mg/dL (ref 0.3–1.2)
Total Protein: 5.8 g/dL — ABNORMAL LOW (ref 6.5–8.1)

## 2022-05-12 LAB — CBC WITH DIFFERENTIAL/PLATELET
Abs Immature Granulocytes: 0.06 10*3/uL (ref 0.00–0.07)
Basophils Absolute: 0.1 10*3/uL (ref 0.0–0.1)
Basophils Relative: 1 %
Eosinophils Absolute: 0.3 10*3/uL (ref 0.0–0.5)
Eosinophils Relative: 3 %
HCT: 35.4 % — ABNORMAL LOW (ref 39.0–52.0)
Hemoglobin: 10.6 g/dL — ABNORMAL LOW (ref 13.0–17.0)
Immature Granulocytes: 1 %
Lymphocytes Relative: 19 %
Lymphs Abs: 1.4 10*3/uL (ref 0.7–4.0)
MCH: 33.4 pg (ref 26.0–34.0)
MCHC: 29.9 g/dL — ABNORMAL LOW (ref 30.0–36.0)
MCV: 111.7 fL — ABNORMAL HIGH (ref 80.0–100.0)
Monocytes Absolute: 0.5 10*3/uL (ref 0.1–1.0)
Monocytes Relative: 7 %
Neutro Abs: 5.3 10*3/uL (ref 1.7–7.7)
Neutrophils Relative %: 69 %
Platelets: 299 10*3/uL (ref 150–400)
RBC: 3.17 MIL/uL — ABNORMAL LOW (ref 4.22–5.81)
RDW: 21.6 % — ABNORMAL HIGH (ref 11.5–15.5)
WBC: 7.7 10*3/uL (ref 4.0–10.5)
nRBC: 0 % (ref 0.0–0.2)

## 2022-05-12 MED ORDER — IOHEXOL 300 MG/ML  SOLN
100.0000 mL | Freq: Once | INTRAMUSCULAR | Status: AC | PRN
  Administered 2022-05-12: 100 mL via INTRAVENOUS

## 2022-05-12 NOTE — ED Notes (Signed)
[facility] to transport to [facility] ?

## 2022-05-12 NOTE — ED Provider Notes (Signed)
Daniel Eaton  Patient Contact: 6:30 PM (approximate)   History   Leg Swelling   HPI  Daniel Calfee. is a 82 y.o. male with a history of atrial fibrillation currently on Eliquis, hypertension, hyperlipidemia, COPD and CHF, presents to the emergency department with acute right lower extremity swelling.  Patient states that he has a history of hematomas of the right lower extremity which was evacuated on 07/21/2021.  Patient states that initial hematoma was sustained by hitting his chairlift.  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  He does have some erythema surrounding proximal soft tissue mass.  No fever or chills at home.  Patient states that he is bedbound and does not walk.      Physical Exam   Triage Vital Signs: ED Triage Vitals  Enc Vitals Group     BP 05/12/22 1531 131/78     Pulse Rate 05/12/22 1531 78     Resp 05/12/22 1531 18     Temp 05/12/22 1531 98.3 F (36.8 C)     Temp src --      SpO2 05/12/22 1531 98 %     Weight 05/12/22 1532 240 lb 4.8 oz (109 kg)     Height 05/12/22 1532 5\' 11"  (1.803 m)     Head Circumference --      Peak Flow --      Pain Score 05/12/22 1531 5     Pain Loc --      Pain Edu? --      Excl. in [place]? --     Most recent vital signs: Vitals:   05/12/22 1531 05/12/22 1958  BP: 131/78 (!) 107/52  Pulse: 78 76  Resp: 18 15  Temp: 98.3 F (36.8 C) 98.2 F (36.8 C)  SpO2: 98% 97%     General: Alert and in no acute distress. Eyes:  PERRL. EOMI. Head: No acute traumatic findings ENT:      Nose: No congestion/rhinnorhea.      Mouth/Throat: Mucous membranes are moist. Neck: No stridor. No cervical spine tenderness to palpation. Cardiovascular:  Good peripheral perfusion Respiratory: Normal respiratory effort without tachypnea or retractions. Lungs CTAB. Good air entry to the bases with no decreased or absent  breath sounds. Gastrointestinal: Bowel sounds 4 quadrants. Soft and nontender to palpation. No guarding or rigidity. No palpable masses. No distention. No CVA tenderness. Musculoskeletal: Full range of motion to all extremities.  Neurologic:  No gross focal neurologic deficits are appreciated.  Skin: Patient has 2 soft tissue masses of right lower extremity.  1 is positioned along right lateral thigh and the other is localized to right medial calf.  It has some dusky erythema overlying proximal soft tissue mass.  Palpable dorsalis pedis pulse bilaterally and symmetrically.   ED Results / Procedures / Treatments   Labs (all labs ordered are listed, but only abnormal results are displayed) Labs Reviewed  CBC WITH DIFFERENTIAL/PLATELET - Abnormal; Notable for the following components:      Result Value   RBC 3.17 (*)    Hemoglobin 10.6 (*)    HCT 35.4 (*)    MCV 111.7 (*)    MCHC 29.9 (*)    RDW 21.6 (*)    All other components within normal limits  COMPREHENSIVE METABOLIC PANEL - Abnormal; Notable for the following components:   Calcium 8.3 (*)    Total Protein 5.8 (*)  Albumin 2.7 (*)    All other components within normal limits       RADIOLOGY  I personally viewed and evaluated these images as part of my medical decision making, as well as reviewing the written report by the radiologist.  ED Provider Interpretation: CT right femur and tibia/fibula consistent with hematomas   PROCEDURES:  Critical Care performed: No  Procedures   MEDICATIONS ORDERED IN ED: Medications  iohexol (OMNIPAQUE) 300 MG/ML solution 100 mL (100 mLs Intravenous Contrast Given 05/12/22 1834)     IMPRESSION / MDM / [place] / ED COURSE  I reviewed the triage vital signs and the nursing notes.                              Assessment and plan:  Right lower extremity swelling:  82 year old male presents to the emergency department with concern for right lower extremity swelling  and possible new hematoma formation.  Vital signs are reassuring at triage.  On exam, patient was alert, active and nontoxic-appearing.  CBC and CMP consistent with patient's baseline labs.  Venous ultrasound shows no signs of DVT.  There were new complex fluid collections of right lower extremity identified on venous ultrasound and radiologist recommended further characterization with CT.  CT right femur and right tibia/fibula are consistent with hematomas.  Given no new skin compromise on physical exam, I do not feel that patient requires emergent intervention at this time.  Recommended outpatient follow-up with orthopedics.  Return precautions were given to return with new or worsening symptoms.   FINAL CLINICAL IMPRESSION(S) / ED DIAGNOSES   Final diagnoses:  Leg swelling     Rx / DC Orders   ED Discharge Orders     None        [place]:  This document was prepared using Dragon voice recognition software and may include unintentional dictation errors.   Vallarie Mare [place], PA-C 05/12/22 2237    Duffy Bruce, MD 05/14/22 [phone]

## 2022-05-12 NOTE — Discharge Instructions (Signed)
Your CT scan is consistent with a hematoma. Hematomas tend to resolve on their own. You have an increased chance of developing hematomas due to your blood thinner.

## 2022-05-12 NOTE — ED Notes (Signed)
First nurse note: Pt here via [facility] from [facility] with c/o of R leg swelling.   135/72 98%  HR:80

## 2022-05-12 NOTE — ED Triage Notes (Signed)
Pt to ED [facility] from [facility] for swelling to right lower leg for one day. Pt bed bound

## 2022-05-17 DIAGNOSIS — D649 Anemia, unspecified: Secondary | ICD-10-CM | POA: Diagnosis not present

## 2022-05-17 DIAGNOSIS — R1310 Dysphagia, unspecified: Secondary | ICD-10-CM | POA: Diagnosis not present

## 2022-05-17 DIAGNOSIS — Z6841 Body Mass Index (BMI) 40.0 and over, adult: Secondary | ICD-10-CM | POA: Diagnosis not present

## 2022-05-18 ENCOUNTER — Telehealth: Payer: Self-pay

## 2022-05-18 DIAGNOSIS — M6281 Muscle weakness (generalized): Secondary | ICD-10-CM | POA: Diagnosis not present

## 2022-05-18 NOTE — Telephone Encounter (Signed)
        Patient  visited [place] on 3/28     Telephone encounter attempt :  1st  A HIPAA compliant voice message was left requesting a return call.  Instructed patient to call back.    [facility] [phone] [address], [place], [place] [address] Phone: [phone] Email: [email or web address]

## 2022-05-19 ENCOUNTER — Telehealth: Payer: Self-pay

## 2022-05-19 NOTE — Telephone Encounter (Signed)
     Patient  visit on 3/28  at [place]   Have you been able to follow up with your primary care physician? Yes   The patient was or was not able to obtain any needed medicine or equipment. Yes   Are there diet recommendations that you are having difficulty following? Na   Patient expresses understanding of discharge instructions and education provided has no other needs at this time.  Yes      [facility] [phone] [address], [place],  [address] Phone: [phone] Email: [email or web address]

## 2022-05-24 DIAGNOSIS — I509 Heart failure, unspecified: Secondary | ICD-10-CM | POA: Diagnosis not present

## 2022-05-24 DIAGNOSIS — L97819 Non-pressure chronic ulcer of other part of right lower leg with unspecified severity: Secondary | ICD-10-CM | POA: Diagnosis not present

## 2022-05-24 DIAGNOSIS — S71112A Laceration without foreign body, left thigh, initial encounter: Secondary | ICD-10-CM | POA: Diagnosis not present

## 2022-05-24 DIAGNOSIS — M6281 Muscle weakness (generalized): Secondary | ICD-10-CM | POA: Diagnosis not present

## 2022-05-24 DIAGNOSIS — D509 Iron deficiency anemia, unspecified: Secondary | ICD-10-CM | POA: Diagnosis not present

## 2022-05-31 ENCOUNTER — Inpatient Hospital Stay: Payer: PPO | Admitting: Oncology

## 2022-05-31 ENCOUNTER — Inpatient Hospital Stay: Payer: PPO

## 2022-05-31 DIAGNOSIS — I509 Heart failure, unspecified: Secondary | ICD-10-CM | POA: Diagnosis not present

## 2022-05-31 DIAGNOSIS — S51812A Laceration without foreign body of left forearm, initial encounter: Secondary | ICD-10-CM | POA: Diagnosis not present

## 2022-05-31 DIAGNOSIS — L97819 Non-pressure chronic ulcer of other part of right lower leg with unspecified severity: Secondary | ICD-10-CM | POA: Diagnosis not present

## 2022-05-31 DIAGNOSIS — M6281 Muscle weakness (generalized): Secondary | ICD-10-CM | POA: Diagnosis not present

## 2022-06-06 ENCOUNTER — Inpatient Hospital Stay: Payer: PPO

## 2022-06-06 ENCOUNTER — Encounter: Payer: Self-pay | Admitting: Oncology

## 2022-06-06 ENCOUNTER — Inpatient Hospital Stay: Payer: PPO | Attending: Oncology | Admitting: Oncology

## 2022-06-06 VITALS — BP 106/79 | HR 74 | Temp 97.8°F | Resp 18

## 2022-06-06 DIAGNOSIS — D7589 Other specified diseases of blood and blood-forming organs: Secondary | ICD-10-CM

## 2022-06-06 DIAGNOSIS — D539 Nutritional anemia, unspecified: Secondary | ICD-10-CM | POA: Insufficient documentation

## 2022-06-06 DIAGNOSIS — Z87891 Personal history of nicotine dependence: Secondary | ICD-10-CM

## 2022-06-06 DIAGNOSIS — H26493 Other secondary cataract, bilateral: Secondary | ICD-10-CM | POA: Diagnosis not present

## 2022-06-06 DIAGNOSIS — Z8052 Family history of malignant neoplasm of bladder: Secondary | ICD-10-CM | POA: Diagnosis not present

## 2022-06-06 DIAGNOSIS — Z7901 Long term (current) use of anticoagulants: Secondary | ICD-10-CM

## 2022-06-06 DIAGNOSIS — H10503 Unspecified blepharoconjunctivitis, bilateral: Secondary | ICD-10-CM | POA: Diagnosis not present

## 2022-06-06 DIAGNOSIS — I4891 Unspecified atrial fibrillation: Secondary | ICD-10-CM

## 2022-06-06 DIAGNOSIS — H04123 Dry eye syndrome of bilateral lacrimal glands: Secondary | ICD-10-CM | POA: Diagnosis not present

## 2022-06-06 LAB — LACTATE DEHYDROGENASE: LDH: 140 U/L (ref 98–192)

## 2022-06-06 LAB — CBC WITH DIFFERENTIAL/PLATELET
Abs Immature Granulocytes: 0.12 10*3/uL — ABNORMAL HIGH (ref 0.00–0.07)
Basophils Absolute: 0.1 10*3/uL (ref 0.0–0.1)
Basophils Relative: 1 %
Eosinophils Absolute: 0.3 10*3/uL (ref 0.0–0.5)
Eosinophils Relative: 3 %
HCT: 38.9 % — ABNORMAL LOW (ref 39.0–52.0)
Hemoglobin: 12.1 g/dL — ABNORMAL LOW (ref 13.0–17.0)
Immature Granulocytes: 1 %
Lymphocytes Relative: 16 %
Lymphs Abs: 1.3 10*3/uL (ref 0.7–4.0)
MCH: 34.1 pg — ABNORMAL HIGH (ref 26.0–34.0)
MCHC: 31.1 g/dL (ref 30.0–36.0)
MCV: 109.6 fL — ABNORMAL HIGH (ref 80.0–100.0)
Monocytes Absolute: 0.5 10*3/uL (ref 0.1–1.0)
Monocytes Relative: 6 %
Neutro Abs: 6 10*3/uL (ref 1.7–7.7)
Neutrophils Relative %: 73 %
Platelets: 298 10*3/uL (ref 150–400)
RBC: 3.55 MIL/uL — ABNORMAL LOW (ref 4.22–5.81)
RDW: 18.4 % — ABNORMAL HIGH (ref 11.5–15.5)
WBC: 8.3 10*3/uL (ref 4.0–10.5)
nRBC: 0 % (ref 0.0–0.2)

## 2022-06-06 LAB — TECHNOLOGIST SMEAR REVIEW: Plt Morphology: ADEQUATE

## 2022-06-06 LAB — FERRITIN: Ferritin: 32 ng/mL (ref 24–336)

## 2022-06-06 LAB — SAMPLE TO BLOOD BANK

## 2022-06-06 LAB — VITAMIN B12: Vitamin B-12: 185 pg/mL (ref 180–914)

## 2022-06-06 LAB — IRON AND TIBC
Iron: 154 ug/dL (ref 45–182)
Saturation Ratios: 47 % — ABNORMAL HIGH (ref 17.9–39.5)
TIBC: 330 ug/dL (ref 250–450)
UIBC: 176 ug/dL

## 2022-06-06 LAB — RETIC PANEL
Immature Retic Fract: 31.1 % — ABNORMAL HIGH (ref 2.3–15.9)
RBC.: 3.57 MIL/uL — ABNORMAL LOW (ref 4.22–5.81)
Retic Count, Absolute: 92.8 10*3/uL (ref 19.0–186.0)
Retic Ct Pct: 2.6 % (ref 0.4–3.1)
Reticulocyte Hemoglobin: 32.3 pg (ref >27.9)

## 2022-06-06 LAB — FOLATE: Folate: 10 ng/mL (ref >5.9)

## 2022-06-06 NOTE — Assessment & Plan Note (Addendum)
Recent hemoglobin decrease could be secondary to hematoma.  Chronic macrocytosis.  Check cbc, cmp, smear, myleoma panel, B12, folate, iron tibc ferritin, LDH, hold tube.  We discussed about possible of bone marrow biopsy if peripheral blood work is not helpful in determine etiology of anemia.

## 2022-06-06 NOTE — Progress Notes (Signed)
Hematology/Oncology Consult note [phone] [phone]     REFERRING PROVIDER: Sherol Dade, *  CHIEF COMPLAINTS/REASON FOR VISIT:  Anemia  ASSESSMENT & PLAN:  Macrocytic anemia Recent hemoglobin decrease could be secondary to hematoma.  Chronic macrocytosis.  Check cbc, cmp, smear, myleoma panel, B12, folate, iron tibc ferritin, LDH, hold tube.  We discussed about possible of bone marrow biopsy if peripheral blood work is not helpful in determine etiology of anemia.   Orders Placed This Encounter  Procedures   Vitamin B12    Standing Status:   Future    Number of Occurrences:   1    Standing Expiration Date:   06/06/2023   Folate    Standing Status:   Future    Number of Occurrences:   1    Standing Expiration Date:   06/06/2023   CBC with Differential/Platelet    Standing Status:   Future    Number of Occurrences:   1    Standing Expiration Date:   06/06/2023   Retic Panel    Standing Status:   Future    Number of Occurrences:   1    Standing Expiration Date:   06/06/2023   Multiple Myeloma Panel (SPEP&IFE w/QIG)    Standing Status:   Future    Number of Occurrences:   1    Standing Expiration Date:   06/06/2023   Kappa/lambda light chains    Standing Status:   Future    Number of Occurrences:   1    Standing Expiration Date:   06/06/2023   Lactate dehydrogenase    Standing Status:   Future    Number of Occurrences:   1    Standing Expiration Date:   06/06/2023   Haptoglobin    Standing Status:   Future    Number of Occurrences:   1    Standing Expiration Date:   06/06/2023   Iron and TIBC    Standing Status:   Future    Number of Occurrences:   1    Standing Expiration Date:   06/06/2023   Ferritin    Standing Status:   Future    Number of Occurrences:   1    Standing Expiration Date:   12/06/2022   Technologist smear review    Standing Status:   Future    Number of Occurrences:   1    Standing Expiration Date:   06/06/2023     Order Specific Question:   Clinical information:    Answer:   anemia   Hold Tube- Blood Bank    Standing Status:   Future    Number of Occurrences:   1    Standing Expiration Date:   06/06/2023   RTC 3-4 weeks to review results.  All questions were answered. The patient knows to call the clinic with any problems, questions or concerns.  [name], MD, PhD [facility] Hematology Oncology 06/06/2022     HISTORY OF PRESENTING ILLNESS:  Daniel Eaton. is a  82 y.o.  male with PMH listed below who was referred to me for anemia Reviewed patient's recent labs that was done.   Reviewed patient's previous labs ordered by other provider, anemia is chronic onset , duration is since  04/26/22 Hb 6.8, mcv 105, plt 480 04/29/22 Hb 8.7, mcv 106 plt 506, B12 581, Folate 6.1, Cr 1,  05/24/22 Hb 11.1, MCV 106.8, wbc 6, plt 273 Patient reports feeling tired.   He denies  recent chest pain on exertion, pre-syncopal episodes, or palpitations He had not noticed any recent bleeding such as epistaxis, hematuria or hematochezia.  Patient has A Fib,  previously on pradaxa, and Xarelto, now on Eliquis 5mg  BID. He has noticed more easy bruising while on Eliquis.   Recent history of right lower extremity hematoma, was seen at ER on 05/12/22, initial hematoma was sustained by hitting his chairlift. Venous ultrasound shows no signs of DVT. CT confirmed hematoma   MEDICAL HISTORY:  Past Medical History:  Diagnosis Date   A-fib    Cellulitis    CHF (congestive heart failure)    COPD (chronic obstructive pulmonary disease)    Difficult intubation    Hyperlipidemia    Hypertension    Hypothyroidism    Kidney disease    Sleep apnea    Stroke    Thyroid disease     SURGICAL HISTORY: Past Surgical History:  Procedure Laterality Date   CARDIAC CATHETERIZATION     CATARACT EXTRACTION W/ INTRAOCULAR LENS  IMPLANT, BILATERAL     COLONOSCOPY  2008   HERNIA REPAIR     bilateral inguinal hernia/ [facility]    HERNIA REPAIR  02/02/2015   18 x 28 cm ventral light mesh placed laparoscopically.   TONSILLECTOMY     UMBILICAL HERNIA REPAIR N/A 02/02/2015   Procedure: HERNIA REPAIR UMBILICAL ADULT;  Surgeon: Earline Mayotte, MD;  Location: [facility] ORS;  Service: General;  Laterality: N/A;   VENTRAL HERNIA REPAIR N/A 02/02/2015   Procedure: LAPAROSCOPIC VENTRAL HERNIA;  Surgeon: Earline Mayotte, MD;  Location: [facility] ORS;  Service: General;  Laterality: N/A;   vp shunt placement  1979    SOCIAL HISTORY: Social History   Socioeconomic History   Marital status: Divorced    Spouse name: Not on file   Number of children: Not on file   Years of education: Not on file   Highest education level: Not on file  Occupational History   Not on file  Tobacco Use   Smoking status: Former    Packs/day: 1.00    Years: 41.00    Additional pack years: 0.00    Total pack years: 41.00    Types: Cigarettes    Quit date: 1999    Years since quitting: 25.3   Smokeless tobacco: Never  Vaping Use   Vaping Use: Never used  Substance and Sexual Activity   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  MEDICATIONS:  Current Outpatient Medications  Medication Sig Dispense Refill   acetaminophen (TYLENOL) 500 MG tablet Take 500 mg by mouth every 6 (six) hours as needed for moderate pain.     Acetaminophen 500 MG capsule Take 1,000  mg by mouth See admin instructions. Bid x 21 days  albuterol (VENTOLIN HFA) 108 (90 Base) MCG/ACT inhaler Inhale 2 puffs into the lungs every 6 (six) hours as needed for wheezing or shortness of breath. (Patient taking differently: Inhale 2 puffs into the lungs See admin instructions. Qid x 3 days)     albuterol (VENTOLIN HFA) 108 (90 Base) MCG/ACT inhaler Inhale 2 puffs into the lungs every 6 (six) hours as needed for wheezing.     amiodarone (PACERONE) 200 MG tablet Take 200 mg by mouth daily.      ammonium lactate (LAC-HYDRIN) 12 % lotion Apply 1 Application topically as needed for dry skin.     apixaban (ELIQUIS) 5 MG TABS tablet Take 5 mg by mouth 2 (two) times daily.     Carboxymethylcellulose Sod PF (REFRESH CELLUVISC) 1 % GEL Apply 1 drop to eye every 12 (twelve) hours. Right eye     ferrous sulfate 325 (65 FE) MG tablet Take 1 tablet (325 mg total) by mouth daily. 30 tablet 3   Fluticasone-Umeclidin-Vilant (TRELEGY ELLIPTA) 100-62.5-25 MCG/INH AEPB Inhale 1 puff into the lungs daily.     guaiFENesin (MUCINEX) 600 MG 12 hr tablet Take 600 mg by mouth See admin instructions. Bid x 5 days (Patient not taking: Reported on 08/24/2021)     HYDROcodone-acetaminophen (NORCO/VICODIN) 5-325 MG tablet Take 1 tablet by mouth every 8 (eight) hours as needed for moderate pain.     hydrOXYzine (ATARAX) 25 MG tablet Take 25 mg by mouth See admin instructions. Every 12 hours x 30 days for pruritis     levothyroxine (SYNTHROID) 88 MCG tablet Take 88 mcg by mouth daily.      loratadine (CLARITIN) 10 MG tablet Take 10 mg by mouth daily.     metolazone (ZAROXOLYN) 2.5 MG tablet Hold pending outpatient followup due to acute kidney injury. (Patient not taking: Reported on 03/06/2021)     metoprolol succinate (TOPROL-XL) 50 MG 24 hr tablet Take 50 mg by mouth daily. Take with or immediately following a meal.     Multiple Vitamin (MULTI-VITAMINS) TABS Take 1 tablet by mouth daily.     Multiple Vitamins-Minerals  (DECUBI-VITE PO) Take 1 capsule by mouth daily. 400 mcg -50 mg     Omega-3 Fatty Acids (OMEGA 3 PO) Take 2,000 mg by mouth daily.     Omeprazole 20 MG TBEC Take 40 mg by mouth daily.      polyethylene glycol (MIRALAX / GLYCOLAX) 17 g packet Take 17 g by mouth daily as needed. (Patient taking differently: Take 17 g by mouth daily as needed for mild constipation.) 14 each 0   potassium chloride (MICRO-K) 10 MEQ CR capsule Hold while not taking diuretics.     predniSONE (DELTASONE) 20 MG tablet Take 40 mg by mouth See admin instructions. Qd x 5 days (Patient not taking: Reported on 08/24/2021)     sertraline (ZOLOFT) 25 MG tablet Take 25 mg by mouth daily.     spironolactone (ALDACTONE) 50 MG tablet Hold pending outpatient followup due to acute kidney injury.     torsemide (DEMADEX) 20 MG tablet Hold pending outpatient followup due to acute kidney injury. 30 tablet 0   traZODone (DESYREL) 50 MG tablet Take 50 mg by mouth at bedtime.     No current facility-administered medications for this visit.    Review of Systems  Constitutional:  Positive for fatigue. Negative for appetite change, chills, fever and unexpected weight change.  HENT:   Negative for hearing loss and voice change.   Eyes:  Negative for  eye problems and icterus.  Respiratory:  Negative for chest tightness, cough and shortness of breath.   Cardiovascular:  Negative for chest pain and leg swelling.  Gastrointestinal:  Negative for abdominal distention and abdominal pain.  Endocrine: Negative for hot flashes.  Genitourinary:  Negative for difficulty urinating, dysuria and frequency.   Musculoskeletal:  Negative for arthralgias.       Right lower extremity hematoma  Skin:  Negative for itching and rash.  Neurological:  Negative for light-headedness and numbness.  Hematological:  Negative for adenopathy. Bruises/bleeds easily.  Psychiatric/Behavioral:  Negative for confusion.     PHYSICAL EXAMINATION: Vitals:   06/06/22 1041   BP: 106/79  Pulse: 74  Resp: 18  Temp: 97.8 F (36.6 C)   There were no vitals filed for this visit.  Physical Exam Constitutional:      General: He is not in acute distress. HENT:     Head: Normocephalic and atraumatic.  Eyes:     General: No scleral icterus. Cardiovascular:     Rate and Rhythm: Normal rate and regular rhythm.     Heart sounds: Normal heart sounds.  Pulmonary:     Effort: Pulmonary effort is normal. No respiratory distress.     Breath sounds: No wheezing.  Abdominal:     General: Bowel sounds are normal. There is no distension.     Palpations: Abdomen is soft.  Musculoskeletal:        General: No deformity. Normal range of motion.     Cervical back: Normal range of motion and neck supple.     Comments: Right lower extremity fluid collection with mild skin erythematous changes.   Skin:    General: Skin is warm and dry.     Findings: No erythema or rash.  Neurological:     Mental Status: He is alert and oriented to person, place, and time. Mental status is at baseline.     Cranial Nerves: No cranial nerve deficit.     Coordination: Coordination normal.  Psychiatric:        Mood and Affect: Mood normal.      LABORATORY DATA:  I have reviewed the data as listed    Latest Ref Rng & Units 05/12/2022    4:07 PM 04/26/2022    8:15 PM 12/09/2021    4:53 PM  CBC  WBC 4.0 - 10.5 K/uL 7.7  10.2  7.9   Hemoglobin 13.0 - 17.0 g/dL 16.1  6.9  6.4   Hematocrit 39.0 - 52.0 % 35.4  23.2  25.1   Platelets 150 - 400 K/uL 299  494  328       Latest Ref Rng & Units 05/12/2022    4:07 PM 04/26/2022    8:15 PM 12/09/2021    4:53 PM  CMP  Glucose 70 - 99 mg/dL 93  096  94   BUN 8 - 23 mg/dL 18  20  17    Creatinine 0.61 - 1.24 mg/dL 0.45  4.09  8.11   Sodium 135 - 145 mmol/L 138  131  139   Potassium 3.5 - 5.1 mmol/L 4.1  4.2  3.8   Chloride 98 - 111 mmol/L 102  100  105   CO2 22 - 32 mmol/L 26  22  25    Calcium 8.9 - 10.3 mg/dL 8.3  7.8  8.0   Total Protein  6.5 - 8.1 g/dL 5.8  5.7  5.6   Total Bilirubin 0.3 - 1.2 mg/dL 0.7  0.8  0.6   Alkaline Phos 38 - 126 U/L 67  98  70   AST 15 - 41 U/L 21  24  17    ALT 0 - 44 U/L 10  15  11     Lab Results  Component Value Date   IRON 16 (L) 12/09/2021   TIBC 354 12/09/2021   IRONPCTSAT 5 (L) 12/09/2021   FERRITIN 11 (L) 12/09/2021     RADIOGRAPHIC STUDIES: I have personally reviewed the radiological images as listed and agreed with the findings in the report. CT FEMUR RIGHT W CONTRAST  Result Date: 05/12/2022 CLINICAL DATA:  Soft tissue mass in the deep thigh. History of swelling to the right lower leg for 1 day. EXAM: CT OF THE LOWER RIGHT EXTREMITY WITH CONTRAST, CT RIGHT TIBIA AND FIBULA WITH CONTRAST TECHNIQUE: Multidetector CT imaging of the lower right extremity was performed according to the standard protocol following intravenous contrast administration. Images were obtained extending from the right hip through the right ankle. RADIATION DOSE REDUCTION: This exam was performed according to the departmental dose-optimization program which includes automated exposure control, adjustment of the mA and/or kV according to patient size and/or use of iterative reconstruction technique. CONTRAST:  OMNIPAQUE IOHEXOL 300 MG/ML  SOLN COMPARISON:  Ultrasound right lower extremity 05/12/2022. Right hip and right knee radiographs 12/10/2018 FINDINGS: Bones/Joint/Cartilage Diffuse bone demineralization. Degenerative changes in the right hip and right knee. Right femur, right knee, and right tibia and fibula appear otherwise intact. No evidence of acute fracture or dislocation. No focal bone lesion or bone destruction. No significant effusions. Ligaments Suboptimally assessed by CT. Muscles and Tendons Diffuse fatty atrophy of the musculature of the thigh and lower leg. Soft tissues There is diffuse soft tissue edema throughout the right lower extremity extending from lateral to the hip along the anterior and medial  thigh and circumferentially throughout the knee and tib fib region. Large heterogeneous loculated collection along the anteromedial lower extremity beginning at the level of the knee and extending to the level of the upper/mid tibia. The collection measures about 4.8 by 10 cm in axial dimension and extends for a length of about 13.1 cm. The collection has heterogeneous density without obvious peripheral contrast enhancement. The increased density could be due to hemorrhagic products or to hazy contrast opacification throughout the lesion, as no noncontrast images are available for comparison. This is likely a hematoma but a soft tissue mass lesion is not excluded. The ultrasound appearance would be more consistent with a hematoma. Abscess would be less likely. IMPRESSION: 1. Heterogeneous mixed density soft tissue collection in the anteromedial lower extremity extending from the knee to the proximal/mid tibial level. Appearances most likely to represent an organizing hematoma although soft tissue tumor would not be excluded. Abscess less likely. 2. No acute bony abnormalities. 3. Diffuse soft tissue edema throughout the right lower extremity. 4. Degenerative changes in the right hip and right knee. 5. Diffuse bone demineralization and diffuse fatty atrophy of the musculature. Electronically Signed   By: Burman Nieves M.D.   On: 05/12/2022 19:15   CT TIBIA FIBULA RIGHT W CONTRAST  Result Date: 05/12/2022 CLINICAL DATA:  Soft tissue mass in the deep thigh. History of swelling to the right lower leg for 1 day. EXAM: CT OF THE LOWER RIGHT EXTREMITY WITH CONTRAST, CT RIGHT TIBIA AND FIBULA WITH CONTRAST TECHNIQUE: Multidetector CT imaging of the lower right extremity was performed according to the standard protocol following intravenous contrast administration. Images were obtained extending from  the right hip through the right ankle. RADIATION DOSE REDUCTION: This exam was performed according to the departmental  dose-optimization program which includes automated exposure control, adjustment of the mA and/or kV according to patient size and/or use of iterative reconstruction technique. CONTRAST:  OMNIPAQUE IOHEXOL 300 MG/ML  SOLN COMPARISON:  Ultrasound right lower extremity 05/12/2022. Right hip and right knee radiographs 12/10/2018 FINDINGS: Bones/Joint/Cartilage Diffuse bone demineralization. Degenerative changes in the right hip and right knee. Right femur, right knee, and right tibia and fibula appear otherwise intact. No evidence of acute fracture or dislocation. No focal bone lesion or bone destruction. No significant effusions. Ligaments Suboptimally assessed by CT. Muscles and Tendons Diffuse fatty atrophy of the musculature of the thigh and lower leg. Soft tissues There is diffuse soft tissue edema throughout the right lower extremity extending from lateral to the hip along the anterior and medial thigh and circumferentially throughout the knee and tib fib region. Large heterogeneous loculated collection along the anteromedial lower extremity beginning at the level of the knee and extending to the level of the upper/mid tibia. The collection measures about 4.8 by 10 cm in axial dimension and extends for a length of about 13.1 cm. The collection has heterogeneous density without obvious peripheral contrast enhancement. The increased density could be due to hemorrhagic products or to hazy contrast opacification throughout the lesion, as no noncontrast images are available for comparison. This is likely a hematoma but a soft tissue mass lesion is not excluded. The ultrasound appearance would be more consistent with a hematoma. Abscess would be less likely. IMPRESSION: 1. Heterogeneous mixed density soft tissue collection in the anteromedial lower extremity extending from the knee to the proximal/mid tibial level. Appearances most likely to represent an organizing hematoma although soft tissue tumor would not be  excluded. Abscess less likely. 2. No acute bony abnormalities. 3. Diffuse soft tissue edema throughout the right lower extremity. 4. Degenerative changes in the right hip and right knee. 5. Diffuse bone demineralization and diffuse fatty atrophy of the musculature. Electronically Signed   By: Burman Nieves M.D.   On: 05/12/2022 19:15   US Venous Img Lower Unilateral Right  Result Date: 05/12/2022 CLINICAL DATA:  Swelling EXAM: RIGHT LOWER EXTREMITY VENOUS DOPPLER ULTRASOUND TECHNIQUE: Gray-scale sonography with graded compression, as well as color Doppler and duplex ultrasound were performed to evaluate the lower extremity deep venous systems from the level of the common femoral vein and including the common femoral, femoral, profunda femoral, popliteal and calf veins including the posterior tibial, peroneal and gastrocnemius veins when visible. The superficial great saphenous vein was also interrogated. Spectral Doppler was utilized to evaluate flow at rest and with distal augmentation maneuvers in the common femoral, femoral and popliteal veins. COMPARISON:  None Available. FINDINGS: Contralateral Common Femoral Vein: Respiratory phasicity is normal and symmetric with the symptomatic side. No evidence of thrombus. Normal compressibility. Common Femoral Vein: No evidence of thrombus. Normal compressibility, respiratory phasicity and response to augmentation. Saphenofemoral Junction: No evidence of thrombus. Normal compressibility and flow on color Doppler imaging. Profunda Femoral Vein: No evidence of thrombus. Normal compressibility and flow on color Doppler imaging. Femoral Vein: No evidence of thrombus. Normal compressibility, respiratory phasicity and response to augmentation. Popliteal Vein: Poorly visualized on grayscale imaging, but without evidence of thrombus on color Doppler evaluation. Calf Veins: Poorly visualized on grayscale imaging, but without evidence of thrombus on color Doppler evaluation.  Superficial Great Saphenous Vein: No evidence of thrombus. Normal compressibility. Venous Reflux:  None.  Other Findings: There is a focal area in the anteromedial aspect of the right leg where there are 2 heterogeneous fluid collections measuring 3.5 x 1.9 x 3.5 cm and 3.8 x 1.3 x 2.3 cm. These are nonspecific and could represent hematomas or abscesses. IMPRESSION: 1. No evidence of deep venous thrombosis in the right lower extremity. 2. There are 2 heterogeneous fluid collections in the anteromedial aspect of the right leg measuring 3.5 x 1.9 x 3.5 cm and 3.8 x 1.3 x 2.3 cm. These are nonspecific and could represent hematomas or abscesses. Correlate with physical exam and consider further evaluation with contrast-enhanced CT of the leg Electronically Signed   By: Lorenza Cambridge M.D.   On: 05/12/2022 17:33

## 2022-06-07 DIAGNOSIS — M6281 Muscle weakness (generalized): Secondary | ICD-10-CM | POA: Diagnosis not present

## 2022-06-07 DIAGNOSIS — I509 Heart failure, unspecified: Secondary | ICD-10-CM | POA: Diagnosis not present

## 2022-06-07 DIAGNOSIS — S51012A Laceration without foreign body of left elbow, initial encounter: Secondary | ICD-10-CM | POA: Diagnosis not present

## 2022-06-07 LAB — HAPTOGLOBIN: Haptoglobin: 252 mg/dL (ref 38–329)

## 2022-06-07 LAB — KAPPA/LAMBDA LIGHT CHAINS
Kappa free light chain: 41.8 mg/L — ABNORMAL HIGH (ref 3.3–19.4)
Kappa, lambda light chain ratio: 1.47 (ref 0.26–1.65)
Lambda free light chains: 28.4 mg/L — ABNORMAL HIGH (ref 5.7–26.3)

## 2022-06-10 LAB — MULTIPLE MYELOMA PANEL, SERUM
Albumin SerPl Elph-Mcnc: 2.8 g/dL — ABNORMAL LOW (ref 2.9–4.4)
Albumin/Glob SerPl: 1.1 (ref 0.7–1.7)
Alpha 1: 0.3 g/dL (ref 0.0–0.4)
Alpha2 Glob SerPl Elph-Mcnc: 0.9 g/dL (ref 0.4–1.0)
B-Globulin SerPl Elph-Mcnc: 1 g/dL (ref 0.7–1.3)
Gamma Glob SerPl Elph-Mcnc: 0.7 g/dL (ref 0.4–1.8)
Globulin, Total: 2.8 g/dL (ref 2.2–3.9)
IgA: 229 mg/dL (ref 61–437)
IgG (Immunoglobin G), Serum: 870 mg/dL (ref 603–1613)
IgM (Immunoglobulin M), Srm: 48 mg/dL (ref 15–143)
Total Protein ELP: 5.6 g/dL — ABNORMAL LOW (ref 6.0–8.5)

## 2022-06-14 DIAGNOSIS — I872 Venous insufficiency (chronic) (peripheral): Secondary | ICD-10-CM | POA: Diagnosis not present

## 2022-06-14 DIAGNOSIS — M6281 Muscle weakness (generalized): Secondary | ICD-10-CM | POA: Diagnosis not present

## 2022-06-14 DIAGNOSIS — I509 Heart failure, unspecified: Secondary | ICD-10-CM | POA: Diagnosis not present

## 2022-06-14 DIAGNOSIS — L97819 Non-pressure chronic ulcer of other part of right lower leg with unspecified severity: Secondary | ICD-10-CM | POA: Diagnosis not present

## 2022-06-15 DIAGNOSIS — M6281 Muscle weakness (generalized): Secondary | ICD-10-CM | POA: Diagnosis not present

## 2022-06-20 DIAGNOSIS — Z515 Encounter for palliative care: Secondary | ICD-10-CM | POA: Diagnosis not present

## 2022-06-20 DIAGNOSIS — I10 Essential (primary) hypertension: Secondary | ICD-10-CM | POA: Diagnosis not present

## 2022-06-20 DIAGNOSIS — Y92129 Unspecified place in nursing home as the place of occurrence of the external cause: Secondary | ICD-10-CM | POA: Insufficient documentation

## 2022-06-20 DIAGNOSIS — S81801D Unspecified open wound, right lower leg, subsequent encounter: Secondary | ICD-10-CM | POA: Diagnosis not present

## 2022-06-20 DIAGNOSIS — I878 Other specified disorders of veins: Secondary | ICD-10-CM | POA: Diagnosis not present

## 2022-06-20 DIAGNOSIS — D539 Nutritional anemia, unspecified: Secondary | ICD-10-CM | POA: Diagnosis present

## 2022-06-20 DIAGNOSIS — M79605 Pain in left leg: Secondary | ICD-10-CM | POA: Diagnosis not present

## 2022-06-20 DIAGNOSIS — R051 Acute cough: Secondary | ICD-10-CM | POA: Diagnosis not present

## 2022-06-20 DIAGNOSIS — D72829 Elevated white blood cell count, unspecified: Secondary | ICD-10-CM | POA: Insufficient documentation

## 2022-06-20 DIAGNOSIS — M79604 Pain in right leg: Secondary | ICD-10-CM | POA: Diagnosis not present

## 2022-06-20 DIAGNOSIS — I517 Cardiomegaly: Secondary | ICD-10-CM | POA: Diagnosis not present

## 2022-06-20 DIAGNOSIS — R69 Illness, unspecified: Secondary | ICD-10-CM | POA: Diagnosis not present

## 2022-06-20 DIAGNOSIS — I872 Venous insufficiency (chronic) (peripheral): Secondary | ICD-10-CM | POA: Diagnosis not present

## 2022-06-20 DIAGNOSIS — I4891 Unspecified atrial fibrillation: Secondary | ICD-10-CM | POA: Diagnosis present

## 2022-06-20 DIAGNOSIS — E872 Acidosis, unspecified: Secondary | ICD-10-CM | POA: Diagnosis present

## 2022-06-20 DIAGNOSIS — R799 Abnormal finding of blood chemistry, unspecified: Secondary | ICD-10-CM | POA: Diagnosis not present

## 2022-06-20 DIAGNOSIS — N1831 Chronic kidney disease, stage 3a: Secondary | ICD-10-CM | POA: Diagnosis present

## 2022-06-20 DIAGNOSIS — J449 Chronic obstructive pulmonary disease, unspecified: Secondary | ICD-10-CM | POA: Insufficient documentation

## 2022-06-20 DIAGNOSIS — S81802A Unspecified open wound, left lower leg, initial encounter: Secondary | ICD-10-CM | POA: Diagnosis present

## 2022-06-20 DIAGNOSIS — I130 Hypertensive heart and chronic kidney disease with heart failure and stage 1 through stage 4 chronic kidney disease, or unspecified chronic kidney disease: Secondary | ICD-10-CM | POA: Diagnosis present

## 2022-06-20 DIAGNOSIS — I509 Heart failure, unspecified: Secondary | ICD-10-CM | POA: Insufficient documentation

## 2022-06-20 DIAGNOSIS — S81812A Laceration without foreign body, left lower leg, initial encounter: Secondary | ICD-10-CM | POA: Diagnosis not present

## 2022-06-20 DIAGNOSIS — I96 Gangrene, not elsewhere classified: Secondary | ICD-10-CM | POA: Diagnosis present

## 2022-06-20 DIAGNOSIS — Z1611 Resistance to penicillins: Secondary | ICD-10-CM | POA: Diagnosis present

## 2022-06-20 DIAGNOSIS — Z7401 Bed confinement status: Secondary | ICD-10-CM | POA: Diagnosis not present

## 2022-06-20 DIAGNOSIS — S81801A Unspecified open wound, right lower leg, initial encounter: Secondary | ICD-10-CM | POA: Diagnosis not present

## 2022-06-20 DIAGNOSIS — E039 Hypothyroidism, unspecified: Secondary | ICD-10-CM | POA: Diagnosis present

## 2022-06-20 DIAGNOSIS — R609 Edema, unspecified: Secondary | ICD-10-CM | POA: Diagnosis not present

## 2022-06-20 DIAGNOSIS — E875 Hyperkalemia: Secondary | ICD-10-CM | POA: Diagnosis present

## 2022-06-20 DIAGNOSIS — W228XXA Striking against or struck by other objects, initial encounter: Secondary | ICD-10-CM | POA: Diagnosis present

## 2022-06-20 DIAGNOSIS — Z66 Do not resuscitate: Secondary | ICD-10-CM | POA: Diagnosis not present

## 2022-06-20 DIAGNOSIS — Z7189 Other specified counseling: Secondary | ICD-10-CM | POA: Diagnosis not present

## 2022-06-20 DIAGNOSIS — Z4801 Encounter for change or removal of surgical wound dressing: Secondary | ICD-10-CM | POA: Diagnosis not present

## 2022-06-20 DIAGNOSIS — R531 Weakness: Secondary | ICD-10-CM | POA: Diagnosis not present

## 2022-06-20 DIAGNOSIS — I69354 Hemiplegia and hemiparesis following cerebral infarction affecting left non-dominant side: Secondary | ICD-10-CM | POA: Diagnosis not present

## 2022-06-20 DIAGNOSIS — Z7901 Long term (current) use of anticoagulants: Secondary | ICD-10-CM | POA: Diagnosis not present

## 2022-06-20 DIAGNOSIS — N390 Urinary tract infection, site not specified: Secondary | ICD-10-CM | POA: Diagnosis not present

## 2022-06-20 DIAGNOSIS — L02416 Cutaneous abscess of left lower limb: Secondary | ICD-10-CM | POA: Diagnosis present

## 2022-06-20 DIAGNOSIS — T148XXA Other injury of unspecified body region, initial encounter: Secondary | ICD-10-CM | POA: Diagnosis present

## 2022-06-20 DIAGNOSIS — E783 Hyperchylomicronemia: Secondary | ICD-10-CM | POA: Diagnosis not present

## 2022-06-20 DIAGNOSIS — G4733 Obstructive sleep apnea (adult) (pediatric): Secondary | ICD-10-CM | POA: Diagnosis present

## 2022-06-20 DIAGNOSIS — Z1623 Resistance to quinolones and fluoroquinolones: Secondary | ICD-10-CM | POA: Diagnosis present

## 2022-06-20 DIAGNOSIS — D62 Acute posthemorrhagic anemia: Secondary | ICD-10-CM | POA: Diagnosis not present

## 2022-06-20 DIAGNOSIS — Z1629 Resistance to other single specified antibiotic: Secondary | ICD-10-CM | POA: Diagnosis present

## 2022-06-20 DIAGNOSIS — S8012XA Contusion of left lower leg, initial encounter: Secondary | ICD-10-CM | POA: Diagnosis not present

## 2022-06-20 DIAGNOSIS — Z6837 Body mass index (BMI) 37.0-37.9, adult: Secondary | ICD-10-CM | POA: Diagnosis not present

## 2022-06-20 DIAGNOSIS — Z87891 Personal history of nicotine dependence: Secondary | ICD-10-CM | POA: Diagnosis not present

## 2022-06-20 DIAGNOSIS — I429 Cardiomyopathy, unspecified: Secondary | ICD-10-CM | POA: Diagnosis present

## 2022-06-20 DIAGNOSIS — M6281 Muscle weakness (generalized): Secondary | ICD-10-CM | POA: Diagnosis not present

## 2022-06-20 DIAGNOSIS — R58 Hemorrhage, not elsewhere classified: Secondary | ICD-10-CM | POA: Diagnosis not present

## 2022-06-20 DIAGNOSIS — I5022 Chronic systolic (congestive) heart failure: Secondary | ICD-10-CM | POA: Diagnosis present

## 2022-06-20 DIAGNOSIS — N179 Acute kidney failure, unspecified: Secondary | ICD-10-CM | POA: Diagnosis present

## 2022-06-20 DIAGNOSIS — Y92121 Bathroom in nursing home as the place of occurrence of the external cause: Secondary | ICD-10-CM | POA: Diagnosis not present

## 2022-06-20 DIAGNOSIS — E785 Hyperlipidemia, unspecified: Secondary | ICD-10-CM | POA: Diagnosis present

## 2022-06-20 DIAGNOSIS — D649 Anemia, unspecified: Secondary | ICD-10-CM | POA: Diagnosis not present

## 2022-06-20 DIAGNOSIS — D72820 Lymphocytosis (symptomatic): Secondary | ICD-10-CM | POA: Diagnosis not present

## 2022-06-20 DIAGNOSIS — I959 Hypotension, unspecified: Secondary | ICD-10-CM | POA: Diagnosis present

## 2022-06-20 LAB — COMPREHENSIVE METABOLIC PANEL
ALT: 10 U/L (ref 0–44)
AST: 21 U/L (ref 15–41)
Albumin: 2.9 g/dL — ABNORMAL LOW (ref 3.5–5.0)
Alkaline Phosphatase: 60 U/L (ref 38–126)
Anion gap: 6 (ref 5–15)
BUN: 17 mg/dL (ref 8–23)
CO2: 27 mmol/L (ref 22–32)
Calcium: 8.4 mg/dL — ABNORMAL LOW (ref 8.9–10.3)
Chloride: 104 mmol/L (ref 98–111)
Creatinine, Ser: 1 mg/dL (ref 0.61–1.24)
GFR, Estimated: >60 mL/min (ref >60)
Glucose, Bld: 136 mg/dL — ABNORMAL HIGH (ref 70–99)
Potassium: 4.5 mmol/L (ref 3.5–5.1)
Sodium: 137 mmol/L (ref 135–145)
Total Bilirubin: 0.6 mg/dL (ref 0.3–1.2)
Total Protein: 5.7 g/dL — ABNORMAL LOW (ref 6.5–8.1)

## 2022-06-20 LAB — CBC WITH DIFFERENTIAL/PLATELET
Abs Immature Granulocytes: 0.13 10*3/uL — ABNORMAL HIGH (ref 0.00–0.07)
Basophils Absolute: 0.1 10*3/uL (ref 0.0–0.1)
Basophils Relative: 1 %
Eosinophils Absolute: 0.2 10*3/uL (ref 0.0–0.5)
Eosinophils Relative: 1 %
HCT: 29.9 % — ABNORMAL LOW (ref 39.0–52.0)
Hemoglobin: 9.3 g/dL — ABNORMAL LOW (ref 13.0–17.0)
Immature Granulocytes: 1 %
Lymphocytes Relative: 21 %
Lymphs Abs: 3.1 10*3/uL (ref 0.7–4.0)
MCH: 34.2 pg — ABNORMAL HIGH (ref 26.0–34.0)
MCHC: 31.1 g/dL (ref 30.0–36.0)
MCV: 109.9 fL — ABNORMAL HIGH (ref 80.0–100.0)
Monocytes Absolute: 1 10*3/uL (ref 0.1–1.0)
Monocytes Relative: 7 %
Neutro Abs: 10.3 10*3/uL — ABNORMAL HIGH (ref 1.7–7.7)
Neutrophils Relative %: 69 %
Platelets: 320 10*3/uL (ref 150–400)
RBC: 2.72 MIL/uL — ABNORMAL LOW (ref 4.22–5.81)
RDW: 17.2 % — ABNORMAL HIGH (ref 11.5–15.5)
WBC: 14.7 10*3/uL — ABNORMAL HIGH (ref 4.0–10.5)
nRBC: 0.1 % (ref 0.0–0.2)

## 2022-06-20 NOTE — ED Notes (Signed)
While in triage, pt beings c/o Nausea and " feeling fluttery in stomach."

## 2022-06-20 NOTE — ED Triage Notes (Signed)
To triage via [facility] with wound to left lower leg. Pt states he was being transferred into bed using hoyer lift when lift hit him on leg, resulting in hematoma to area.  Today pt states area " ruptured"  Pt presents to ED with area wrapped in guaze from facility. Guaze saturated. Dressing changed in triage.  Pt noted to have extensive bruising to leg, extending to posterior upper thigh.

## 2022-06-21 ENCOUNTER — Emergency Department
Admission: EM | Admit: 2022-06-21 | Discharge: 2022-06-21 | Disposition: A | Discharge status: Skilled Nursing Facility | Payer: PPO | Source: Home / Self Care | Attending: Emergency Medicine | Admitting: Emergency Medicine

## 2022-06-21 DIAGNOSIS — I872 Venous insufficiency (chronic) (peripheral): Secondary | ICD-10-CM | POA: Diagnosis not present

## 2022-06-21 DIAGNOSIS — I509 Heart failure, unspecified: Secondary | ICD-10-CM | POA: Diagnosis not present

## 2022-06-21 DIAGNOSIS — S81812A Laceration without foreign body, left lower leg, initial encounter: Secondary | ICD-10-CM | POA: Diagnosis not present

## 2022-06-21 DIAGNOSIS — T148XXA Other injury of unspecified body region, initial encounter: Secondary | ICD-10-CM

## 2022-06-21 DIAGNOSIS — Z7901 Long term (current) use of anticoagulants: Secondary | ICD-10-CM

## 2022-06-21 DIAGNOSIS — M6281 Muscle weakness (generalized): Secondary | ICD-10-CM | POA: Diagnosis not present

## 2022-06-21 LAB — HEMOGLOBIN AND HEMATOCRIT, BLOOD
HCT: 29.3 % — ABNORMAL LOW (ref 39.0–52.0)
Hemoglobin: 8.7 g/dL — ABNORMAL LOW (ref 13.0–17.0)

## 2022-06-21 MED ORDER — ACETAMINOPHEN 500 MG PO TABS
1000.0000 mg | ORAL_TABLET | Freq: Once | ORAL | Status: AC
  Administered 2022-06-21: 1000 mg via ORAL
  Filled 2022-06-21: qty 2

## 2022-06-21 MED ORDER — HYDROCODONE-ACETAMINOPHEN 5-325 MG PO TABS
1.0000 | ORAL_TABLET | Freq: Once | ORAL | Status: AC
  Administered 2022-06-21: 1 via ORAL
  Filled 2022-06-21: qty 1

## 2022-06-21 MED ORDER — OXYCODONE HCL 5 MG PO TABS
5.0000 mg | ORAL_TABLET | Freq: Once | ORAL | Status: AC
  Administered 2022-06-21: 5 mg via ORAL
  Filled 2022-06-21: qty 1

## 2022-06-21 NOTE — ED Notes (Signed)
Pts leg wound re-wrapped due to leg wound bleeding through first pressure gauze. Another pressure bandage placed on pts leg to stop bleeding.

## 2022-06-21 NOTE — ED Notes (Signed)
Patient brief and linens changed.

## 2022-06-21 NOTE — ED Notes (Signed)
Pt A&O x4, no obvious distress noted, respirations regular/unlabored. Pt verbalizes understanding of discharge instructions. Pt transported via  EMS following report to facility.

## 2022-06-21 NOTE — Discharge Instructions (Addendum)
Please provide local wound care to the site of his hematoma to help prevent infection.   He will need a hemoglobin recheck later this week. Hemoglobin here at 8.7  Return to the ED with any worsening symptoms

## 2022-06-21 NOTE — ED Provider Notes (Signed)
[facility] Provider Note    Event Date/Time   First MD Initiated Contact with Patient 06/21/22 0025     (approximate)   History   Leg Injury   HPI  Daniel Eaton. is a 82 y.o. male who presents to the ED for evaluation of Leg Injury   I review hematology visit from 4/22, seen for a macrocytic anemia.  Anticoagulated on Eliquis due to A-fib.  History of COPD on Trelegy.  Amiodarone.  CHF  Patient presents to the ED for evaluation of a hematoma in his leg that apparently burst.  He reports that this past Friday, about 4 days ago, while he was at his facility there was try to transfer him with a [employer] lift when they accidentally struck his left lower leg on the wheelchair and they are moving him to causing an injury.  He reports a hematoma developing over the weekend but it "burst" tonight and drained blood.  Due to this they sent him to the ED.  He reports chronic symptoms at baseline without fever, further injury.  Denies any syncope, dyspnea or chest pain.   Physical Exam   Triage Vital Signs: ED Triage Vitals [06/20/22 2051]  Enc Vitals Group     BP 105/61     Pulse Rate 94     Resp 18     Temp 97.9 F (36.6 C)     Temp Source Oral     SpO2 99 %     Weight 240 lb (108.9 kg)     Height 5\' 11"  (1.803 m)     Head Circumference      Peak Flow      Pain Score 7     Pain Loc      Pain Edu?      Excl. in GC?     Most recent vital signs: Vitals:   06/20/22 2051 06/21/22 0218  BP: 105/61 108/65  Pulse: 94 92  Resp: 18 18  Temp: 97.9 F (36.6 C) 98.3 F (36.8 C)  SpO2: 99% 99%    General: Awake, no distress.  CV:  Good peripheral perfusion.  Resp:  Normal effort.  Abd:  No distention.  MSK:  Bruising noted around the left leg, as pictured below.  I take down a firmly wrapped gauze bandage and ABD pad, and as below he has a wound to the lateral aspect of his left shin with a visible portion of clot but no active bleeding. Neuro:  No  focal deficits appreciated. Other:     ED Results / Procedures / Treatments   Labs (all labs ordered are listed, but only abnormal results are displayed) Labs Reviewed  CBC WITH DIFFERENTIAL/PLATELET - Abnormal; Notable for the following components:      Result Value   WBC 14.7 (*)    RBC 2.72 (*)    Hemoglobin 9.3 (*)    HCT 29.9 (*)    MCV 109.9 (*)    MCH 34.2 (*)    RDW 17.2 (*)    Neutro Abs 10.3 (*)    Abs Immature Granulocytes 0.13 (*)    All other components within normal limits  COMPREHENSIVE METABOLIC PANEL - Abnormal; Notable for the following components:   Glucose, Bld 136 (*)    Calcium 8.4 (*)    Total Protein 5.7 (*)    Albumin 2.9 (*)    All other components within normal limits  HEMOGLOBIN AND HEMATOCRIT, BLOOD - Abnormal; Notable for  the following components:   Hemoglobin 8.7 (*)    HCT 29.3 (*)    All other components within normal limits    EKG   RADIOLOGY   Official radiology report(s): No results found.  PROCEDURES and INTERVENTIONS:  Procedures  Medications  acetaminophen (TYLENOL) tablet 1,000 mg (1,000 mg Oral Given 06/21/22 0210)  oxyCODONE (Oxy IR/ROXICODONE) immediate release tablet 5 mg (5 mg Oral Given 06/21/22 0210)     IMPRESSION / MDM / ASSESSMENT AND PLAN / ED COURSE  I reviewed the triage vital signs and the nursing notes.  Differential diagnosis includes, but is not limited to, cellulitis, sepsis, blood loss anemia, hematoma  {Patient presents with symptoms of an acute illness or injury that is potentially life-threatening.  Pleasant 82 year old male presents to the ED after a traumatic hematoma that occurred a few days ago begins draining.  He looks well and his first hemoglobin has dropped a couple points since a comparison from a couple weeks ago.  He has no active bleeding though.  Suspect a lot of this is due to the hematoma and subcutaneous blood causing the significant bruising and swelling on his leg as pictured above.   Leukocytosis is noted but I doubt infectious etiology of his symptoms.  We will observe him, replace bandage and recheck an H&H.  I suspect he will be suitable to return to his facility and pursue local wound care.  Patient's repeat H&H is similar, down about half a point.  He has no active bleeding I suspect this is settling from his blood loss related to his hematoma and bruising.  Suspect the majority of his blood is from subcutaneous bruising.  While I certainly considered observation admission for this patient, he looks well and he will be returning to a monitored environment and is well-connected as an outpatient regarding his anemia.  He is stable and no other signs of other bleeding diatheses and no indications for transfusion.  Will discharge back to his SNF with recommendations for wound care  Clinical Course as of 06/21/22 0749  Tue Jun 21, 2022  1610 reassessed [DS]    Clinical Course User Index [DS] Delton Prairie, MD     FINAL CLINICAL IMPRESSION(S) / ED DIAGNOSES   Final diagnoses:  Traumatic hematoma  Anticoagulated  Wound drainage     Rx / DC Orders   ED Discharge Orders     None        Note:  This document was prepared using Dragon voice recognition software and may include unintentional dictation errors.   Delton Prairie, MD 06/21/22 [phone]

## 2022-06-22 ENCOUNTER — Encounter: Payer: Self-pay | Admitting: Emergency Medicine

## 2022-06-22 ENCOUNTER — Emergency Department: Payer: PPO

## 2022-06-22 ENCOUNTER — Inpatient Hospital Stay
Admission: EM | Admit: 2022-06-22 | Discharge: 2022-06-29 | DRG: 571 | Disposition: A | Discharge status: Skilled Nursing Facility | Payer: PPO | Source: Skilled Nursing Facility | Attending: Internal Medicine | Admitting: Internal Medicine

## 2022-06-22 ENCOUNTER — Other Ambulatory Visit: Payer: Self-pay

## 2022-06-22 DIAGNOSIS — Z1623 Resistance to quinolones and fluoroquinolones: Secondary | ICD-10-CM | POA: Diagnosis present

## 2022-06-22 DIAGNOSIS — I69354 Hemiplegia and hemiparesis following cerebral infarction affecting left non-dominant side: Secondary | ICD-10-CM

## 2022-06-22 DIAGNOSIS — Y92121 Bathroom in nursing home as the place of occurrence of the external cause: Secondary | ICD-10-CM

## 2022-06-22 DIAGNOSIS — J449 Chronic obstructive pulmonary disease, unspecified: Secondary | ICD-10-CM | POA: Diagnosis present

## 2022-06-22 DIAGNOSIS — W228XXA Striking against or struck by other objects, initial encounter: Secondary | ICD-10-CM | POA: Diagnosis present

## 2022-06-22 DIAGNOSIS — N179 Acute kidney failure, unspecified: Secondary | ICD-10-CM | POA: Diagnosis present

## 2022-06-22 DIAGNOSIS — E872 Acidosis, unspecified: Secondary | ICD-10-CM | POA: Diagnosis present

## 2022-06-22 DIAGNOSIS — G4733 Obstructive sleep apnea (adult) (pediatric): Secondary | ICD-10-CM | POA: Diagnosis present

## 2022-06-22 DIAGNOSIS — M1711 Unilateral primary osteoarthritis, right knee: Secondary | ICD-10-CM | POA: Diagnosis present

## 2022-06-22 DIAGNOSIS — Z1629 Resistance to other single specified antibiotic: Secondary | ICD-10-CM | POA: Diagnosis present

## 2022-06-22 DIAGNOSIS — S81802A Unspecified open wound, left lower leg, initial encounter: Secondary | ICD-10-CM | POA: Diagnosis present

## 2022-06-22 DIAGNOSIS — Z79890 Hormone replacement therapy (postmenopausal): Secondary | ICD-10-CM

## 2022-06-22 DIAGNOSIS — I10 Essential (primary) hypertension: Secondary | ICD-10-CM | POA: Diagnosis not present

## 2022-06-22 DIAGNOSIS — E669 Obesity, unspecified: Secondary | ICD-10-CM | POA: Diagnosis present

## 2022-06-22 DIAGNOSIS — Z7189 Other specified counseling: Secondary | ICD-10-CM | POA: Diagnosis not present

## 2022-06-22 DIAGNOSIS — Z8052 Family history of malignant neoplasm of bladder: Secondary | ICD-10-CM

## 2022-06-22 DIAGNOSIS — I5022 Chronic systolic (congestive) heart failure: Secondary | ICD-10-CM | POA: Diagnosis present

## 2022-06-22 DIAGNOSIS — Z66 Do not resuscitate: Secondary | ICD-10-CM | POA: Diagnosis not present

## 2022-06-22 DIAGNOSIS — N1831 Chronic kidney disease, stage 3a: Secondary | ICD-10-CM | POA: Diagnosis present

## 2022-06-22 DIAGNOSIS — E785 Hyperlipidemia, unspecified: Secondary | ICD-10-CM | POA: Diagnosis present

## 2022-06-22 DIAGNOSIS — S8012XA Contusion of left lower leg, initial encounter: Secondary | ICD-10-CM | POA: Diagnosis not present

## 2022-06-22 DIAGNOSIS — I96 Gangrene, not elsewhere classified: Secondary | ICD-10-CM | POA: Diagnosis present

## 2022-06-22 DIAGNOSIS — I130 Hypertensive heart and chronic kidney disease with heart failure and stage 1 through stage 4 chronic kidney disease, or unspecified chronic kidney disease: Secondary | ICD-10-CM | POA: Diagnosis present

## 2022-06-22 DIAGNOSIS — Z6837 Body mass index (BMI) 37.0-37.9, adult: Secondary | ICD-10-CM

## 2022-06-22 DIAGNOSIS — Z87891 Personal history of nicotine dependence: Secondary | ICD-10-CM

## 2022-06-22 DIAGNOSIS — I429 Cardiomyopathy, unspecified: Secondary | ICD-10-CM | POA: Diagnosis present

## 2022-06-22 DIAGNOSIS — D649 Anemia, unspecified: Secondary | ICD-10-CM | POA: Diagnosis present

## 2022-06-22 DIAGNOSIS — D539 Nutritional anemia, unspecified: Secondary | ICD-10-CM | POA: Diagnosis present

## 2022-06-22 DIAGNOSIS — Z79899 Other long term (current) drug therapy: Secondary | ICD-10-CM

## 2022-06-22 DIAGNOSIS — Z811 Family history of alcohol abuse and dependence: Secondary | ICD-10-CM

## 2022-06-22 DIAGNOSIS — Z7901 Long term (current) use of anticoagulants: Secondary | ICD-10-CM

## 2022-06-22 DIAGNOSIS — Z8249 Family history of ischemic heart disease and other diseases of the circulatory system: Secondary | ICD-10-CM

## 2022-06-22 DIAGNOSIS — I959 Hypotension, unspecified: Secondary | ICD-10-CM | POA: Diagnosis present

## 2022-06-22 DIAGNOSIS — R7303 Prediabetes: Secondary | ICD-10-CM | POA: Diagnosis present

## 2022-06-22 DIAGNOSIS — I4891 Unspecified atrial fibrillation: Secondary | ICD-10-CM | POA: Diagnosis present

## 2022-06-22 DIAGNOSIS — T148XXA Other injury of unspecified body region, initial encounter: Secondary | ICD-10-CM | POA: Diagnosis present

## 2022-06-22 DIAGNOSIS — E039 Hypothyroidism, unspecified: Secondary | ICD-10-CM | POA: Diagnosis present

## 2022-06-22 DIAGNOSIS — D62 Acute posthemorrhagic anemia: Secondary | ICD-10-CM | POA: Diagnosis not present

## 2022-06-22 DIAGNOSIS — E875 Hyperkalemia: Secondary | ICD-10-CM | POA: Diagnosis present

## 2022-06-22 DIAGNOSIS — B9562 Methicillin resistant Staphylococcus aureus infection as the cause of diseases classified elsewhere: Secondary | ICD-10-CM | POA: Diagnosis present

## 2022-06-22 DIAGNOSIS — Z1611 Resistance to penicillins: Secondary | ICD-10-CM | POA: Diagnosis present

## 2022-06-22 DIAGNOSIS — I129 Hypertensive chronic kidney disease with stage 1 through stage 4 chronic kidney disease, or unspecified chronic kidney disease: Secondary | ICD-10-CM | POA: Diagnosis present

## 2022-06-22 DIAGNOSIS — Z7951 Long term (current) use of inhaled steroids: Secondary | ICD-10-CM

## 2022-06-22 DIAGNOSIS — Z888 Allergy status to other drugs, medicaments and biological substances status: Secondary | ICD-10-CM

## 2022-06-22 DIAGNOSIS — Z515 Encounter for palliative care: Secondary | ICD-10-CM | POA: Diagnosis not present

## 2022-06-22 DIAGNOSIS — L02416 Cutaneous abscess of left lower limb: Secondary | ICD-10-CM | POA: Diagnosis present

## 2022-06-22 DIAGNOSIS — I878 Other specified disorders of veins: Secondary | ICD-10-CM | POA: Diagnosis not present

## 2022-06-22 LAB — CBC WITH DIFFERENTIAL/PLATELET
Abs Immature Granulocytes: 0.14 10*3/uL — ABNORMAL HIGH (ref 0.00–0.07)
Basophils Absolute: 0 10*3/uL (ref 0.0–0.1)
Basophils Relative: 0 %
Eosinophils Absolute: 0 10*3/uL (ref 0.0–0.5)
Eosinophils Relative: 0 %
HCT: 17.1 % — ABNORMAL LOW (ref 39.0–52.0)
Hemoglobin: 5.4 g/dL — ABNORMAL LOW (ref 13.0–17.0)
Immature Granulocytes: 1 %
Lymphocytes Relative: 10 %
Lymphs Abs: 1.7 10*3/uL (ref 0.7–4.0)
MCH: 34 pg (ref 26.0–34.0)
MCHC: 31.6 g/dL (ref 30.0–36.0)
MCV: 107.5 fL — ABNORMAL HIGH (ref 80.0–100.0)
Monocytes Absolute: 1.2 10*3/uL — ABNORMAL HIGH (ref 0.1–1.0)
Monocytes Relative: 7 %
Neutro Abs: 13.9 10*3/uL — ABNORMAL HIGH (ref 1.7–7.7)
Neutrophils Relative %: 82 %
Platelets: 336 10*3/uL (ref 150–400)
RBC: 1.59 MIL/uL — ABNORMAL LOW (ref 4.22–5.81)
RDW: 17.2 % — ABNORMAL HIGH (ref 11.5–15.5)
WBC: 16.9 10*3/uL — ABNORMAL HIGH (ref 4.0–10.5)
nRBC: 0.5 % — ABNORMAL HIGH (ref 0.0–0.2)

## 2022-06-22 LAB — COMPREHENSIVE METABOLIC PANEL
ALT: 14 U/L (ref 0–44)
AST: 28 U/L (ref 15–41)
Albumin: 2.8 g/dL — ABNORMAL LOW (ref 3.5–5.0)
Alkaline Phosphatase: 49 U/L (ref 38–126)
Anion gap: 12 (ref 5–15)
BUN: 41 mg/dL — ABNORMAL HIGH (ref 8–23)
CO2: 21 mmol/L — ABNORMAL LOW (ref 22–32)
Calcium: 8.1 mg/dL — ABNORMAL LOW (ref 8.9–10.3)
Chloride: 98 mmol/L (ref 98–111)
Creatinine, Ser: 2.36 mg/dL — ABNORMAL HIGH (ref 0.61–1.24)
GFR, Estimated: 27 mL/min — ABNORMAL LOW (ref >60)
Glucose, Bld: 109 mg/dL — ABNORMAL HIGH (ref 70–99)
Potassium: 5.6 mmol/L — ABNORMAL HIGH (ref 3.5–5.1)
Sodium: 131 mmol/L — ABNORMAL LOW (ref 135–145)
Total Bilirubin: 1 mg/dL (ref 0.3–1.2)
Total Protein: 5.3 g/dL — ABNORMAL LOW (ref 6.5–8.1)

## 2022-06-22 LAB — BPAM RBC
Blood Product Expiration Date: 202406032359
Unit Type and Rh: 6200

## 2022-06-22 LAB — TYPE AND SCREEN
ABO/RH(D): A POS
Unit division: 0

## 2022-06-22 LAB — CBC
HCT: 20.6 % — ABNORMAL LOW (ref 39.0–52.0)
Hemoglobin: 6.4 g/dL — ABNORMAL LOW (ref 13.0–17.0)
MCH: 32.5 pg (ref 26.0–34.0)
MCHC: 31.1 g/dL (ref 30.0–36.0)
MCV: 104.6 fL — ABNORMAL HIGH (ref 80.0–100.0)
Platelets: 260 10*3/uL (ref 150–400)
RBC: 1.97 MIL/uL — ABNORMAL LOW (ref 4.22–5.81)
RDW: 18.8 % — ABNORMAL HIGH (ref 11.5–15.5)
WBC: 17.4 10*3/uL — ABNORMAL HIGH (ref 4.0–10.5)
nRBC: 0.7 % — ABNORMAL HIGH (ref 0.0–0.2)

## 2022-06-22 LAB — PROTIME-INR
INR: 2.3 — ABNORMAL HIGH (ref 0.8–1.2)
Prothrombin Time: 25.5 seconds — ABNORMAL HIGH (ref 11.4–15.2)

## 2022-06-22 LAB — PREPARE RBC (CROSSMATCH)

## 2022-06-22 LAB — APTT: aPTT: 36 seconds (ref 24–36)

## 2022-06-22 LAB — CK: Total CK: 83 U/L (ref 49–397)

## 2022-06-22 LAB — LACTIC ACID, PLASMA: Lactic Acid, Venous: 2.9 mmol/L (ref 0.5–1.9)

## 2022-06-22 MED ORDER — SODIUM CHLORIDE 0.9 % IV SOLN: 10.0000 mL/h | Freq: Once | INTRAVENOUS | Status: DC

## 2022-06-22 MED ORDER — DEXTROSE 50 % IV SOLN
25.0000 g | Freq: Once | INTRAVENOUS | Status: AC
  Administered 2022-06-22: 25 g via INTRAVENOUS
  Filled 2022-06-22: qty 50

## 2022-06-22 MED ORDER — HYDROMORPHONE HCL 1 MG/ML IJ SOLN
0.5000 mg | Freq: Once | INTRAMUSCULAR | Status: AC
  Administered 2022-06-22: 0.5 mg via INTRAVENOUS
  Filled 2022-06-22: qty 0.5

## 2022-06-22 MED ORDER — PROTHROMBIN COMPLEX CONC HUMAN 500 UNITS IV KIT
4952.0000 [IU] | PACK | Status: AC
  Administered 2022-06-22: 4952 [IU] via INTRAVENOUS
  Filled 2022-06-22: qty 4952

## 2022-06-22 MED ORDER — UMECLIDINIUM BROMIDE 62.5 MCG/ACT IN AEPB
1.0000 | INHALATION_SPRAY | Freq: Every day | RESPIRATORY_TRACT | Status: DC
  Administered 2022-06-25 – 2022-06-29 (×5): 1 via RESPIRATORY_TRACT
  Filled 2022-06-22: qty 7

## 2022-06-22 MED ORDER — AMIODARONE HCL 200 MG PO TABS
200.0000 mg | ORAL_TABLET | Freq: Every day | ORAL | Status: DC
  Administered 2022-06-23 – 2022-06-29 (×5): 200 mg via ORAL
  Filled 2022-06-22 (×5): qty 1

## 2022-06-22 MED ORDER — ACETAMINOPHEN 325 MG PO TABS: 650.0000 mg | ORAL_TABLET | Freq: Four times a day (QID) | ORAL | Status: DC | PRN

## 2022-06-22 MED ORDER — HYDROCODONE-ACETAMINOPHEN 5-325 MG PO TABS
1.0000 | ORAL_TABLET | ORAL | Status: DC | PRN
  Administered 2022-06-23 – 2022-06-29 (×10): 1 via ORAL
  Filled 2022-06-22 (×11): qty 1

## 2022-06-22 MED ORDER — LORATADINE 10 MG PO TABS
10.0000 mg | ORAL_TABLET | Freq: Every day | ORAL | Status: DC
  Administered 2022-06-23 – 2022-06-29 (×5): 10 mg via ORAL
  Filled 2022-06-22 (×5): qty 1

## 2022-06-22 MED ORDER — FLUTICASONE FUROATE-VILANTEROL 100-25 MCG/ACT IN AEPB
1.0000 | INHALATION_SPRAY | Freq: Every day | RESPIRATORY_TRACT | Status: DC
  Administered 2022-06-25 – 2022-06-29 (×5): 1 via RESPIRATORY_TRACT
  Filled 2022-06-22: qty 28

## 2022-06-22 MED ORDER — LACTATED RINGERS IV BOLUS
500.0000 mL | Freq: Once | INTRAVENOUS | Status: AC
  Administered 2022-06-22: 500 mL via INTRAVENOUS

## 2022-06-22 MED ORDER — LEVOTHYROXINE SODIUM 88 MCG PO TABS
88.0000 ug | ORAL_TABLET | Freq: Every day | ORAL | Status: DC
  Administered 2022-06-23 – 2022-06-29 (×7): 88 ug via ORAL
  Filled 2022-06-22 (×8): qty 1

## 2022-06-22 MED ORDER — ACETAMINOPHEN 650 MG RE SUPP: 650.0000 mg | Freq: Four times a day (QID) | RECTAL | Status: DC | PRN

## 2022-06-22 MED ORDER — HYDROMORPHONE HCL 1 MG/ML IJ SOLN
1.0000 mg | Freq: Once | INTRAMUSCULAR | Status: AC
  Administered 2022-06-22: 1 mg via INTRAVENOUS
  Filled 2022-06-22: qty 1

## 2022-06-22 MED ORDER — INSULIN ASPART 100 UNIT/ML IV SOLN
10.0000 [IU] | Freq: Once | INTRAVENOUS | Status: AC
  Administered 2022-06-22: 10 [IU] via INTRAVENOUS
  Filled 2022-06-22: qty 0.1

## 2022-06-22 MED ORDER — SODIUM CHLORIDE 0.9% FLUSH
3.0000 mL | Freq: Two times a day (BID) | INTRAVENOUS | Status: DC
  Administered 2022-06-23 – 2022-06-29 (×12): 3 mL via INTRAVENOUS

## 2022-06-22 MED ORDER — ALBUTEROL SULFATE (2.5 MG/3ML) 0.083% IN NEBU: 3.0000 mL | INHALATION_SOLUTION | Freq: Four times a day (QID) | RESPIRATORY_TRACT | Status: DC | PRN

## 2022-06-22 MED ORDER — ONDANSETRON HCL 4 MG/2ML IJ SOLN
4.0000 mg | Freq: Once | INTRAMUSCULAR | Status: AC
  Administered 2022-06-22: 4 mg via INTRAVENOUS
  Filled 2022-06-22: qty 2

## 2022-06-22 NOTE — Assessment & Plan Note (Signed)
Cpap per home setting for his OSA.

## 2022-06-22 NOTE — Assessment & Plan Note (Addendum)
Holding diuretics due to worsening of kidney function.  Give IV fluids and monitor kidney function.  If no improvement will consider nephrology consult

## 2022-06-22 NOTE — H&P (Signed)
History and Physical     Patient: Daniel Eaton. WJX:[id number] DOB: 25-May-1940 DOA: 06/22/2022 DOS: the patient was seen and examined on 06/22/2022 PCP: Lauro Regulus, MD   Patient coming from: [facility] Chief Complaint: Anemia  HISTORY OF PRESENT ILLNESS: Daniel Eaton. is an 82 y.o. male brought from open #4 of anemia with a hemoglobin of 5.8. Few days ago patient was being transferred to the wheelchair struck his left leg causing a hematoma on the left leg.  Patient is on Eliquis and wound care saw the patient on Tuesday and felt that the hematoma needs to be reevaluated and sent him back to the emergency room. No reports of bleeding trauma to the head.  Patient reports pain in the leg about 6 out of 10.  He has not been able to eat for the past few days.  No other complaints. Past Medical History:  Diagnosis Date   A-fib (HCC)    Cellulitis    CHF (congestive heart failure) (HCC)    COPD (chronic obstructive pulmonary disease) (HCC)    Difficult intubation    Hyperlipidemia    Hypertension    Hypothyroidism    Kidney disease    Sleep apnea    Stroke [facility])    Thyroid disease    Review of Systems  Cardiovascular:        1 + PEDAL EDEMA.    Allergies  Allergen Reactions   Diltiazem Hcl Itching and Other (See Comments)    edema   Past Surgical History:  Procedure Laterality Date   CARDIAC CATHETERIZATION     CATARACT EXTRACTION W/ INTRAOCULAR LENS  IMPLANT, BILATERAL     COLONOSCOPY  2008   HERNIA REPAIR     bilateral inguinal hernia/ [facility]   HERNIA REPAIR  02/02/2015   18 x 28 cm ventral light mesh placed laparoscopically.   TONSILLECTOMY     UMBILICAL HERNIA REPAIR N/A 02/02/2015   Procedure: HERNIA REPAIR UMBILICAL ADULT;  Surgeon: Earline Mayotte, MD;  Location: [facility] ORS;  Service: General;  Laterality: N/A;   VENTRAL HERNIA REPAIR N/A 02/02/2015   Procedure: LAPAROSCOPIC VENTRAL HERNIA;  Surgeon: Earline Mayotte, MD;  Location: [facility] ORS;   Service: General;  Laterality: N/A;   vp shunt placement  1979   MEDICATIONS: Prior to Admission medications   Medication Sig Start Date End Date Taking? Authorizing Provider  acetaminophen (TYLENOL) 500 MG tablet Take 500 mg by mouth every 6 (six) hours as needed for moderate pain.    [provider]  Acetaminophen 500 MG capsule Take 1,000 mg by mouth See admin instructions. Bid x 21 days    [provider]  albuterol (VENTOLIN HFA) 108 (90 Base) MCG/ACT inhaler Inhale 2 puffs into the lungs every 6 (six) hours as needed for wheezing or shortness of breath. Patient taking differently: Inhale 2 puffs into the lungs See admin instructions. Qid x 3 days 02/17/21   Darlin Priestly, MD  albuterol (VENTOLIN HFA) 108 (90 Base) MCG/ACT inhaler Inhale 2 puffs into the lungs every 6 (six) hours as needed for wheezing.    [provider]  amiodarone (PACERONE) 200 MG tablet Take 200 mg by mouth daily.  Patient not taking: Reported on 06/06/2022 02/20/14   [provider]  ammonium lactate (LAC-HYDRIN) 12 % lotion Apply 1 Application topically as needed for dry skin. Patient not taking: Reported on 06/06/2022    [provider]  apixaban (ELIQUIS) 5 MG TABS tablet Take 5  mg by mouth 2 (two) times daily.    [provider]  Capsaicin (ASPERCREME PAIN RELIEF PATCH EX) Apply topically every 12 (twelve) hours.    [provider]  Carboxymethylcellulose Sod PF (REFRESH CELLUVISC) 1 % GEL Apply 1 drop to eye every 12 (twelve) hours. Right eye    [provider]  ferrous sulfate 325 (65 FE) MG tablet Take 1 tablet (325 mg total) by mouth daily. 12/09/21 06/06/22  Chesley Noon, MD  Fluticasone-Umeclidin-Vilant (TRELEGY ELLIPTA) 100-62.5-25 MCG/INH AEPB Inhale 1 puff into the lungs daily.    [provider]  guaiFENesin (MUCINEX) 600 MG 12 hr tablet Take 600 mg by mouth See admin instructions. Bid x 5 days Patient not taking: Reported on  08/24/2021    [provider]  HYDROcodone-acetaminophen (NORCO/VICODIN) 5-325 MG tablet Take 1 tablet by mouth every 8 (eight) hours as needed for moderate pain.    [provider]  hydrOXYzine (ATARAX) 25 MG tablet Take 25 mg by mouth See admin instructions. Every 12 hours x 30 days for pruritis Patient not taking: Reported on 06/06/2022    [provider]  levothyroxine (SYNTHROID) 88 MCG tablet Take 88 mcg by mouth daily.  02/20/14   [provider]  loratadine (CLARITIN) 10 MG tablet Take 10 mg by mouth daily.    [provider]  metolazone (ZAROXOLYN) 2.5 MG tablet Hold pending outpatient followup due to acute kidney injury. Patient not taking: Reported on 03/06/2021 02/17/21   Darlin Priestly, MD  metoprolol succinate (TOPROL-XL) 50 MG 24 hr tablet Take 50 mg by mouth daily. Take with or immediately following a meal.    [provider]  Multiple Vitamin (MULTI-VITAMINS) TABS Take 1 tablet by mouth daily.    [provider]  Multiple Vitamins-Minerals (DECUBI-VITE PO) Take 1 capsule by mouth daily. 400 mcg -50 mg    [provider]  Omega-3 Fatty Acids (OMEGA 3 PO) Take 2,000 mg by mouth daily.    [provider]  Omeprazole 20 MG TBEC Take 40 mg by mouth daily.  02/20/14   [provider]  polyethylene glycol (MIRALAX / GLYCOLAX) 17 g packet Take 17 g by mouth daily as needed. Patient taking differently: Take 17 g by mouth daily as needed for mild constipation. 08/03/18   Enid Baas, Jude, MD  potassium chloride (MICRO-K) 10 MEQ CR capsule Hold while not taking diuretics. Patient taking differently: 20 mEq daily. Take 1 tab PO daily with or after a meal & with 4oz liquid. Hold while not taking diuretics. 02/17/21   Darlin Priestly, MD  predniSONE (DELTASONE) 20 MG tablet Take 40 mg by mouth See admin instructions. Qd x 5 days Patient not taking: Reported on 08/24/2021    [provider]  sertraline (ZOLOFT) 50 MG tablet  Take 50 mg by mouth daily.    [provider]  spironolactone (ALDACTONE) 50 MG tablet Hold pending outpatient followup due to acute kidney injury. 02/17/21   Darlin Priestly, MD  torsemide (DEMADEX) 20 MG tablet Hold pending outpatient followup due to acute kidney injury. 02/17/21   Darlin Priestly, MD  traZODone (DESYREL) 50 MG tablet Take 50 mg by mouth at bedtime.    [provider]    [START ON 06/23/2022] amiodarone  200 mg Oral Daily   [START ON 06/23/2022] fluticasone furoate-vilanterol  1 puff Inhalation Daily   And   [START ON 06/23/2022] umeclidinium bromide  1 puff Inhalation Daily    HYDROmorphone (DILAUDID) injection  1 mg  Intravenous Once   [START ON 06/23/2022] levothyroxine  88 mcg Oral Daily   [START ON 06/23/2022] loratadine  10 mg Oral Daily   sodium chloride flush  3 mL Intravenous Q12H   ED Course: Pt in Ed patient is alert awake oriented hard of hearing. Vitals:   06/22/22 2104 06/22/22 2106 06/22/22 2121 06/22/22 2218  BP: (!) 117/58  123/67 119/79  Pulse: 98  96 87  Resp: 19  18 20   Temp: 98.8 F (37.1 C) 98.5 F (36.9 C) 98.5 F (36.9 C) 98.6 F (37 C)  TempSrc: Oral Oral Oral Oral  SpO2: 100%  100% 100%   No intake/output data recorded. SpO2: 100 % Blood work in ed shows: AKI of 2.36 normal LFTs potassium 5.6 sodium 03/17/2019 glucose 109. CPK 83. Lactic 2.9. CBC shows leukocytosis patient has had leukocytosis in the past hemoglobin of 6.4. Platelet count of 230.  Results for orders placed or performed during the hospital encounter of 06/22/22 (from the past 72 hour(s))  CBC with Differential     Status: Abnormal   Collection Time: 06/22/22  2:15 PM  Result Value Ref Range   WBC 16.9 (H) 4.0 - 10.5 K/uL   RBC 1.59 (L) 4.22 - 5.81 MIL/uL   Hemoglobin 5.4 (L) 13.0 - 17.0 g/dL    Comment: REPEATED TO VERIFY   HCT 17.1 (L) 39.0 - 52.0 %   MCV 107.5 (H) 80.0 - 100.0 fL   MCH 34.0 26.0 - 34.0 pg   MCHC 31.6 30.0 - 36.0 g/dL   RDW 16.1 (H) 09.6 - 04.5 %    Platelets 336 150 - 400 K/uL   nRBC 0.5 (H) 0.0 - 0.2 %   Neutrophils Relative % 82 %   Neutro Abs 13.9 (H) 1.7 - 7.7 K/uL   Lymphocytes Relative 10 %   Lymphs Abs 1.7 0.7 - 4.0 K/uL   Monocytes Relative 7 %   Monocytes Absolute 1.2 (H) 0.1 - 1.0 K/uL   Eosinophils Relative 0 %   Eosinophils Absolute 0.0 0.0 - 0.5 K/uL   Basophils Relative 0 %   Basophils Absolute 0.0 0.0 - 0.1 K/uL   Immature Granulocytes 1 %   Abs Immature Granulocytes 0.14 (H) 0.00 - 0.07 K/uL    Comment: Performed at [facility], [address] [place], [place] [address]  Comprehensive metabolic panel     Status: Abnormal   Collection Time: 06/22/22  2:15 PM  Result Value Ref Range   Sodium 131 (L) 135 - 145 mmol/L   Potassium 5.6 (H) 3.5 - 5.1 mmol/L   Chloride 98 98 - 111 mmol/L   CO2 21 (L) 22 - 32 mmol/L   Glucose, Bld 109 (H) 70 - 99 mg/dL    Comment: Glucose reference range applies only to samples taken after fasting for at least 8 hours.   BUN 41 (H) 8 - 23 mg/dL   Creatinine, Ser 1.91 (H) 0.61 - 1.24 mg/dL   Calcium 8.1 (L) 8.9 - 10.3 mg/dL   Total Protein 5.3 (L) 6.5 - 8.1 g/dL   Albumin 2.8 (L) 3.5 - 5.0 g/dL   AST 28 15 - 41 U/L   ALT 14 0 - 44 U/L   Alkaline Phosphatase 49 38 - 126 U/L   Total Bilirubin 1.0 0.3 - 1.2 mg/dL   GFR, Estimated 27 (L) >60 mL/min    Comment: (NOTE) Calculated using the CKD-EPI Creatinine Equation (2021)    Anion gap 12 5 - 15  Comment: Performed at [facility], [address] [place], [place] [address]  Type and screen [facility]     Status: None (Preliminary result)   Collection Time: 06/22/22  2:15 PM  Result Value Ref Range   ABO/RH(D) A POS    Antibody Screen NEG    Sample Expiration 06/25/2022,2359    Unit Number [id number]    Blood Component Type RED CELLS,LR    Unit division 00    Status of Unit ISSUED    Transfusion Status OK TO TRANSFUSE    Crossmatch Result Compatible    Unit Number [id number]     Blood Component Type RBC LR PHER2    Unit division 00    Status of Unit ISSUED    Transfusion Status OK TO TRANSFUSE    Crossmatch Result      Compatible Performed at [facility], [address] [place], [place] [address]   Prepare RBC (crossmatch)     Status: None   Collection Time: 06/22/22  4:00 PM  Result Value Ref Range   Order Confirmation      ORDER PROCESSED BY BLOOD BANK Performed at [facility], [address], [place], [place] [address]   Protime-INR     Status: Abnormal   Collection Time: 06/22/22  7:36 PM  Result Value Ref Range   Prothrombin Time 25.5 (H) 11.4 - 15.2 seconds   INR 2.3 (H) 0.8 - 1.2    Comment: (NOTE) INR goal varies based on device and disease states. Performed at [facility], [address] [place], [place] [address]   APTT     Status: None   Collection Time: 06/22/22  7:36 PM  Result Value Ref Range   aPTT 36 24 - 36 seconds    Comment: Performed at [facility], [address] [place], [place] [address]  CK     Status: None   Collection Time: 06/22/22  8:37 PM  Result Value Ref Range   Total CK 83 49 - 397 U/L    Comment: Performed at [facility], [address] [place], [place] [address]  Lactic acid, plasma     Status: Abnormal   Collection Time: 06/22/22  8:37 PM  Result Value Ref Range   Lactic Acid, Venous 2.9 (HH) 0.5 - 1.9 mmol/L    Comment: CRITICAL RESULT CALLED TO, READ BACK BY AND VERIFIED WITH Mosetta Putt @2106  ON 06/22/22 SKL Performed at [facility], [address] [place], [place] [address]   CBC     Status: Abnormal   Collection Time: 06/22/22  8:37 PM  Result Value Ref Range   WBC 17.4 (H) 4.0 - 10.5 K/uL   RBC 1.97 (L) 4.22 - 5.81 MIL/uL   Hemoglobin 6.4 (L) 13.0 - 17.0 g/dL   HCT 34.7 (L) 42.5 - 95.6 %   MCV 104.6 (H) 80.0 - 100.0 fL   MCH 32.5 26.0 - 34.0 pg   MCHC 31.1 30.0 - 36.0 g/dL   RDW 38.7 (H) 56.4 - 33.2 %   Platelets 260 150  - 400 K/uL   nRBC 0.7 (H) 0.0 - 0.2 %    Comment: Performed at [facility], [address] [place], [place] [address]    Lab Results  Component Value Date   CREATININE 2.36 (H) 06/22/2022   CREATININE 1.00 06/20/2022   CREATININE 1.13 05/12/2022      Latest Ref Rng & Units 06/22/2022    2:15  PM 06/20/2022    8:57 PM 05/12/2022    4:07 PM  CMP  Glucose 70 - 99 mg/dL 161  096  93   BUN 8 - 23 mg/dL 41  17  18   Creatinine 0.61 - 1.24 mg/dL 0.45  4.09  8.11   Sodium 135 - 145 mmol/L 131  137  138   Potassium 3.5 - 5.1 mmol/L 5.6  4.5  4.1   Chloride 98 - 111 mmol/L 98  104  102   CO2 22 - 32 mmol/L 21  27  26    Calcium 8.9 - 10.3 mg/dL 8.1  8.4  8.3   Total Protein 6.5 - 8.1 g/dL 5.3  5.7  5.8   Total Bilirubin 0.3 - 1.2 mg/dL 1.0  0.6  0.7   Alkaline Phos 38 - 126 U/L 49  60  67   AST 15 - 41 U/L 28  21  21    ALT 0 - 44 U/L 14  10  10     Unresulted Labs (From admission, onward)     Start     Ordered   06/23/22 0500  Comprehensive metabolic panel  Tomorrow morning,   R        06/22/22 2127   06/23/22 0500  CBC  Tomorrow morning,   R        06/22/22 2127   06/23/22 0500  Protime-INR  Tomorrow morning,   R        06/22/22 2127   06/23/22 0500  APTT  Tomorrow morning,   R        06/22/22 2127   06/22/22 2121  Hemoglobin A1c  Add-on,   AD        06/22/22 2127   06/22/22 2019  Lactic acid, plasma  Now then every 2 hours,   STAT      06/22/22 2018           Pt has received : Orders Placed This Encounter  Procedures   CT FEMUR RIGHT WO CONTRAST    Please scan from thigh to lower calf    Standing Status:   Standing    Number of Occurrences:   1   CT TIBIA FIBULA RIGHT WO CONTRAST    Please scan from thigh to lower calf    Standing Status:   Standing    Number of Occurrences:   1   CT Tibia Fibula Left Wo Contrast    Standing Status:   Standing    Number of Occurrences:   1   CT FEMUR LEFT WO CONTRAST    Standing Status:   Standing    Number of  Occurrences:   1   CBC with Differential    Standing Status:   Standing    Number of Occurrences:   1   Comprehensive metabolic panel    Standing Status:   Standing    Number of Occurrences:   1   Protime-INR    Standing Status:   Standing    Number of Occurrences:   1   APTT    Standing Status:   Standing    Number of Occurrences:   1   CK    Standing Status:   Standing    Number of Occurrences:   1   Lactic acid, plasma    Standing Status:   Standing    Number of Occurrences:   2   CBC    Standing Status:   Standing  Number of Occurrences:   1   Comprehensive metabolic panel    Standing Status:   Standing    Number of Occurrences:   1   CBC    Standing Status:   Standing    Number of Occurrences:   1   Hemoglobin A1c    Standing Status:   Standing    Number of Occurrences:   1   Protime-INR    Standing Status:   Standing    Number of Occurrences:   1   APTT    Standing Status:   Standing    Number of Occurrences:   1   Diet Heart Room service appropriate? Yes; Fluid consistency: Thin    Standing Status:   Standing    Number of Occurrences:   1    Order Specific Question:   Room service appropriate?    Answer:   Yes    Order Specific Question:   Fluid consistency:    Answer:   Thin   Informed Consent Details: Physician/Practitioner Attestation; Transcribe to consent form and obtain patient signature    Standing Status:   Standing    Number of Occurrences:   1    Order Specific Question:   Physician/Practitioner attestation of informed consent for blood and or blood product transfusion    Answer:   I, the physician/practitioner, attest that I have discussed with the patient the benefits, risks, side effects, alternatives, likelihood of achieving goals and potential problems during recovery for the procedure that I have provided informed consent.    Order Specific Question:   Product(s)    Answer:   All Product(s)   Informed Consent Details: Physician/Practitioner  Attestation; Transcribe to consent form and obtain patient signature    Standing Status:   Standing    Number of Occurrences:   1    Order Specific Question:   Physician/Practitioner attestation of informed consent for blood and or blood product transfusion    Answer:   I, the physician/practitioner, attest that I have discussed with the patient the benefits, risks, side effects, alternatives, likelihood of achieving goals and potential problems during recovery for the procedure that I have provided informed consent.    Order Specific Question:   Product(s)    Answer:   All Product(s)   Maintain IV access    Standing Status:   Standing    Number of Occurrences:   1   Vital signs    Standing Status:   Standing    Number of Occurrences:   1   Notify physician (specify)    Standing Status:   Standing    Number of Occurrences:   20    Order Specific Question:   Notify Physician    Answer:   for pulse less than 55 or greater than 120    Order Specific Question:   Notify Physician    Answer:   for respiratory rate less than 12 or greater than 25    Order Specific Question:   Notify Physician    Answer:   for temperature greater than 100.5 F    Order Specific Question:   Notify Physician    Answer:   for urinary output less than 30 mL/hr for four hours    Order Specific Question:   Notify Physician    Answer:   for systolic BP less than 90 or greater than 160, diastolic BP less than 60 or greater than 100    Order Specific Question:  Notify Physician    Answer:   for new hypoxia w/ oxygen saturations < 88%   Progressive Mobility Protocol: No Restrictions    Standing Status:   Standing    Number of Occurrences:   1   Daily weights    Standing Status:   Standing    Number of Occurrences:   1   Intake and Output    Standing Status:   Standing    Number of Occurrences:   1   Do not place and if present remove PureWick    Standing Status:   Standing    Number of Occurrences:   1    Initiate Oral Care Protocol    Standing Status:   Standing    Number of Occurrences:   1   Initiate Carrier Fluid Protocol    Standing Status:   Standing    Number of Occurrences:   1   RN may order General Admission PRN Orders utilizing "General Admission PRN medications" (through manage orders) for the following patient needs: allergy symptoms (Claritin), cold sores (Carmex), cough (Robitussin DM), eye irritation (Liquifilm Tears), hemorrhoids (Tucks), indigestion (Maalox), minor skin irritation (Hydrocortisone Cream), muscle pain Romeo Apple Gay), nose irritation (saline nasal spray) and sore throat (Chloraseptic spray).    Standing Status:   Standing    Number of Occurrences:   [phone]   Cardiac Monitoring - Continuous Indefinite    Standing Status:   Standing    Number of Occurrences:   1    Order Specific Question:   Indications for use:    Answer:   ICU/Stepdown patient   Neurovascular checks    PT DO PULSE CHECK ON LEFT LEG Q2 HOURS    Standing Status:   Standing    Number of Occurrences:   1   Wound care    Standing Status:   Standing    Number of Occurrences:   1   Full code    Standing Status:   Standing    Number of Occurrences:   1    Order Specific Question:   By:    Answer:   Other   Consult to hospitalist    Standing Status:   Standing    Number of Occurrences:   1    Order Specific Question:   Place call to:    Answer:   [phone]    Order Specific Question:   Reason for Consult    Answer:   Admit    Order Specific Question:   Diagnosis/Clinical Info for Consult:    Answer:   hematoma, anemia   Consult to vascular surgery Consult Timeframe: ROUTINE - requires response within 24 hours; Reason for Consult? hematoma in left thigh form eliuie.  thanks    Standing Status:   Standing    Number of Occurrences:   1    Order Specific Question:   Consult Timeframe    Answer:   ROUTINE - requires response within 24 hours    Order Specific Question:   Reason for Consult?     Answer:   hematoma in left thigh form eliuie.  thanks   Pulse oximetry check with vital signs    Standing Status:   Standing    Number of Occurrences:   1   Oxygen therapy Mode or (Route): Nasal cannula; Liters Per Minute: 2; Keep 02 saturation: greater than 92 %    Standing Status:   Standing    Number of Occurrences:   20  Order Specific Question:   Mode or (Route)    Answer:   Nasal cannula    Order Specific Question:   Liters Per Minute    Answer:   2    Order Specific Question:   Keep 02 saturation    Answer:   greater than 92 %   EKG 12-Lead    Standing Status:   Standing    Number of Occurrences:   1   EKG 12-Lead    Standing Status:   Standing    Number of Occurrences:   1   Type and screen [facility]    [facility]     Standing Status:   Standing    Number of Occurrences:   1   Prepare RBC (crossmatch)    Standing Status:   Standing    Number of Occurrences:   1    Order Specific Question:   # of Units    Answer:   3 units    Order Specific Question:   Transfusion Indications    Answer:   Hemoglobin < 7 gm/dL and symptomatic    Order Specific Question:   Number of Units to Keep Ahead    Answer:   NO units ahead    Order Specific Question:   If emergent release call blood bank    Answer:   Not emergent release   Admit to Inpatient (patient's expected length of stay will be greater than 2 midnights or inpatient only procedure)    Standing Status:   Standing    Number of Occurrences:   1    Order Specific Question:   Hospital Area    Answer:   [facility] [[id number]]    Order Specific Question:   Level of Care    Answer:   Stepdown [14]    Order Specific Question:   Covid Evaluation    Answer:   Asymptomatic - no recent exposure (last 10 days) testing not required    Order Specific Question:   Diagnosis    Answer:   Leg hematoma, left, initial encounter [[id number]]    Order Specific Question:   Admitting  Physician    Answer:   Darrold Junker    Order Specific Question:   Attending Physician    Answer:   Darrold Junker    Order Specific Question:   Certification:    Answer:   I certify this patient will need inpatient services for at least 2 midnights    Order Specific Question:   Estimated Length of Stay    Answer:   5   Aspiration precautions    Standing Status:   Standing    Number of Occurrences:   1   Fall precautions    Standing Status:   Standing    Number of Occurrences:   1    Meds ordered this encounter  Medications   DISCONTD: 0.9 %  sodium chloride infusion   lactated ringers bolus 500 mL   insulin aspart (novoLOG) injection 10 Units   dextrose 50 % solution 25 g   HYDROmorphone (DILAUDID) injection 0.5 mg   ondansetron (ZOFRAN) injection 4 mg   DISCONTD: 0.9 %  sodium chloride infusion   prothrombin complex conc human (KCENTRA) IVPB 4,952 Units    Administer at a rate of 3 units/kg/minute up to a maximum rate of 8.4 mL/min (~210 units/minute). Consider changing administration time to weight-based on a case-by-case basis (ie  life-threatening bleed or lack of IV access sites).   albuterol (PROVENTIL) (2.5 MG/3ML) 0.083% nebulizer solution 3 mL   amiodarone (PACERONE) tablet 200 mg   AND Linked Order Group    fluticasone furoate-vilanterol (BREO ELLIPTA) 100-25 MCG/ACT 1 puff    umeclidinium bromide (INCRUSE ELLIPTA) 62.5 MCG/ACT 1 puff   levothyroxine (SYNTHROID) tablet 88 mcg   loratadine (CLARITIN) tablet 10 mg   sodium chloride flush (NS) 0.9 % injection 3 mL   OR Linked Order Group    acetaminophen (TYLENOL) tablet 650 mg    acetaminophen (TYLENOL) suppository 650 mg   HYDROcodone-acetaminophen (NORCO/VICODIN) 5-325 MG per tablet 1 tablet   HYDROmorphone (DILAUDID) injection 1 mg    Admission Imaging :      CT FEMUR RIGHT WO CONTRAST  Result Date: 06/22/2022 CLINICAL DATA:  Lower leg trauma with swelling from mid thigh to calf with hematoma  and worsening anemia. EXAM: CT OF THE LOWER BILATERAL EXTREMITY WITHOUT CONTRAST TECHNIQUE: Multidetector CT imaging of the right lower extremity was performed according to the standard protocol. RADIATION DOSE REDUCTION: This exam was performed according to the departmental dose-optimization program which includes automated exposure control, adjustment of the mA and/or kV according to patient size and/or use of iterative reconstruction technique. COMPARISON:  CT 05/12/2022 FINDINGS: LEFT: Bones/Joint/Cartilage Demineralization. No acute fracture in the left femur, tibia, or fibula. No dislocation of the hip or knee. No knee joint effusion. Degenerative changes left hip and left knee. Ligaments Suboptimally assessed by CT. Muscles and Tendons Fatty atrophy of the musculature of the thigh and lower leg. Soft tissues Diffuse edema through the soft tissues of the left lower extremity extending from the level of the inferior along the lateral and medial thigh answer from frontally about the knee and calf. RIGHT: Bones/Joint/Cartilage Demineralization. No acute fracture in the right femur, tibia, or fibula. No dislocation of the hip or knee. No knee joint effusion. Degenerative changes right hip and right knee. Soft tissue wound about the anterolateral proximal calf with overlying dressing material. Dressing material extends into the subcutaneous fat in the area of a gas and fluid collection which measures 5.4 x 2.0 cm in the axial plane and extends inferiorly approximately 9.1 cm. Posterior to the gas and fluid collection there is a heterogenous intermediate density collection which extends from the posterolateral distal thigh into the posterolateral proximal calf. This measures 10.1 x 3.6 cm in the axial plane and encompasses a craniocaudal dimension of 25.9 cm. Ligaments Suboptimally assessed by CT. Muscles and Tendons Fatty atrophy of the musculature of the thigh and lower leg. Soft tissues Diffuse edema through the  soft tissues of the right lower extremity extending from the level of hip inferiorly along the lateral and medial thigh and circumferentially throughout the knee and calf. This is not substantially changed from 05/12/2022. Heterogenous loculated collection along the medial tibia proximally the collection measures 8.9 x 3.6 in the axial plane, previously 10.0 x 4.2 cm using similar measuring technique. This extends for a length of approximately 11.0 cm, previously 13.1 cm. The collection again demonstrates heterogenous intermediate density without definite wall thickening. Given slight decrease in size and heterogenous intermediate density this is favored to represent a hematoma. IMPRESSION: RIGHT: 1. Slightly decreased size of the heterogenous intermediate density fluid collection in the medial/proximal calf favored to represent a hematoma. Abscess or soft tissue mass are differential considerations but considered less likely. 2. Unchanged diffuse soft tissue edema throughout the right leg. LEFT: 1. Soft tissue wound about  the lateral proximal calf with overlying dressing material extend into a 5.4 x 2.0 x 9.1 cm gas and fluid collection. Differential considerations include abscess or hematoma with or without superimposed infection. 2. Additional large 10.1 x 3.6 x 25.9 cm intermediate density fluid collection in the posterolateral left leg centered about the knee consistent with hematoma. Differential considerations are soft tissue mass or abscess but these are considered less likely. 3. Diffuse soft tissue edema throughout the right leg. Electronically Signed   By: Minerva Fester M.D.   On: 06/22/2022 18:05   CT TIBIA FIBULA RIGHT WO CONTRAST  Result Date: 06/22/2022 CLINICAL DATA:  Lower leg trauma with swelling from mid thigh to calf with hematoma and worsening anemia. EXAM: CT OF THE LOWER BILATERAL EXTREMITY WITHOUT CONTRAST TECHNIQUE: Multidetector CT imaging of the right lower extremity was performed  according to the standard protocol. RADIATION DOSE REDUCTION: This exam was performed according to the departmental dose-optimization program which includes automated exposure control, adjustment of the mA and/or kV according to patient size and/or use of iterative reconstruction technique. COMPARISON:  CT 05/12/2022 FINDINGS: LEFT: Bones/Joint/Cartilage Demineralization. No acute fracture in the left femur, tibia, or fibula. No dislocation of the hip or knee. No knee joint effusion. Degenerative changes left hip and left knee. Ligaments Suboptimally assessed by CT. Muscles and Tendons Fatty atrophy of the musculature of the thigh and lower leg. Soft tissues Diffuse edema through the soft tissues of the left lower extremity extending from the level of the inferior along the lateral and medial thigh answer from frontally about the knee and calf. RIGHT: Bones/Joint/Cartilage Demineralization. No acute fracture in the right femur, tibia, or fibula. No dislocation of the hip or knee. No knee joint effusion. Degenerative changes right hip and right knee. Soft tissue wound about the anterolateral proximal calf with overlying dressing material. Dressing material extends into the subcutaneous fat in the area of a gas and fluid collection which measures 5.4 x 2.0 cm in the axial plane and extends inferiorly approximately 9.1 cm. Posterior to the gas and fluid collection there is a heterogenous intermediate density collection which extends from the posterolateral distal thigh into the posterolateral proximal calf. This measures 10.1 x 3.6 cm in the axial plane and encompasses a craniocaudal dimension of 25.9 cm. Ligaments Suboptimally assessed by CT. Muscles and Tendons Fatty atrophy of the musculature of the thigh and lower leg. Soft tissues Diffuse edema through the soft tissues of the right lower extremity extending from the level of hip inferiorly along the lateral and medial thigh and circumferentially throughout the  knee and calf. This is not substantially changed from 05/12/2022. Heterogenous loculated collection along the medial tibia proximally the collection measures 8.9 x 3.6 in the axial plane, previously 10.0 x 4.2 cm using similar measuring technique. This extends for a length of approximately 11.0 cm, previously 13.1 cm. The collection again demonstrates heterogenous intermediate density without definite wall thickening. Given slight decrease in size and heterogenous intermediate density this is favored to represent a hematoma. IMPRESSION: RIGHT: 1. Slightly decreased size of the heterogenous intermediate density fluid collection in the medial/proximal calf favored to represent a hematoma. Abscess or soft tissue mass are differential considerations but considered less likely. 2. Unchanged diffuse soft tissue edema throughout the right leg. LEFT: 1. Soft tissue wound about the lateral proximal calf with overlying dressing material extend into a 5.4 x 2.0 x 9.1 cm gas and fluid collection. Differential considerations include abscess or hematoma with or without superimposed infection.  2. Additional large 10.1 x 3.6 x 25.9 cm intermediate density fluid collection in the posterolateral left leg centered about the knee consistent with hematoma. Differential considerations are soft tissue mass or abscess but these are considered less likely. 3. Diffuse soft tissue edema throughout the right leg. Electronically Signed   By: Minerva Fester M.D.   On: 06/22/2022 18:05   CT Tibia Fibula Left Wo Contrast  Result Date: 06/22/2022 CLINICAL DATA:  Lower leg trauma with swelling from mid thigh to calf with hematoma and worsening anemia. EXAM: CT OF THE LOWER BILATERAL EXTREMITY WITHOUT CONTRAST TECHNIQUE: Multidetector CT imaging of the right lower extremity was performed according to the standard protocol. RADIATION DOSE REDUCTION: This exam was performed according to the departmental dose-optimization program which includes  automated exposure control, adjustment of the mA and/or kV according to patient size and/or use of iterative reconstruction technique. COMPARISON:  CT 05/12/2022 FINDINGS: LEFT: Bones/Joint/Cartilage Demineralization. No acute fracture in the left femur, tibia, or fibula. No dislocation of the hip or knee. No knee joint effusion. Degenerative changes left hip and left knee. Ligaments Suboptimally assessed by CT. Muscles and Tendons Fatty atrophy of the musculature of the thigh and lower leg. Soft tissues Diffuse edema through the soft tissues of the left lower extremity extending from the level of the inferior along the lateral and medial thigh answer from frontally about the knee and calf. RIGHT: Bones/Joint/Cartilage Demineralization. No acute fracture in the right femur, tibia, or fibula. No dislocation of the hip or knee. No knee joint effusion. Degenerative changes right hip and right knee. Soft tissue wound about the anterolateral proximal calf with overlying dressing material. Dressing material extends into the subcutaneous fat in the area of a gas and fluid collection which measures 5.4 x 2.0 cm in the axial plane and extends inferiorly approximately 9.1 cm. Posterior to the gas and fluid collection there is a heterogenous intermediate density collection which extends from the posterolateral distal thigh into the posterolateral proximal calf. This measures 10.1 x 3.6 cm in the axial plane and encompasses a craniocaudal dimension of 25.9 cm. Ligaments Suboptimally assessed by CT. Muscles and Tendons Fatty atrophy of the musculature of the thigh and lower leg. Soft tissues Diffuse edema through the soft tissues of the right lower extremity extending from the level of hip inferiorly along the lateral and medial thigh and circumferentially throughout the knee and calf. This is not substantially changed from 05/12/2022. Heterogenous loculated collection along the medial tibia proximally the collection measures  8.9 x 3.6 in the axial plane, previously 10.0 x 4.2 cm using similar measuring technique. This extends for a length of approximately 11.0 cm, previously 13.1 cm. The collection again demonstrates heterogenous intermediate density without definite wall thickening. Given slight decrease in size and heterogenous intermediate density this is favored to represent a hematoma. IMPRESSION: RIGHT: 1. Slightly decreased size of the heterogenous intermediate density fluid collection in the medial/proximal calf favored to represent a hematoma. Abscess or soft tissue mass are differential considerations but considered less likely. 2. Unchanged diffuse soft tissue edema throughout the right leg. LEFT: 1. Soft tissue wound about the lateral proximal calf with overlying dressing material extend into a 5.4 x 2.0 x 9.1 cm gas and fluid collection. Differential considerations include abscess or hematoma with or without superimposed infection. 2. Additional large 10.1 x 3.6 x 25.9 cm intermediate density fluid collection in the posterolateral left leg centered about the knee consistent with hematoma. Differential considerations are soft tissue mass or  abscess but these are considered less likely. 3. Diffuse soft tissue edema throughout the right leg. Electronically Signed   By: Minerva Fester M.D.   On: 06/22/2022 18:05   CT FEMUR LEFT WO CONTRAST  Result Date: 06/22/2022 CLINICAL DATA:  Lower leg trauma with swelling from mid thigh to calf with hematoma and worsening anemia. EXAM: CT OF THE LOWER BILATERAL EXTREMITY WITHOUT CONTRAST TECHNIQUE: Multidetector CT imaging of the right lower extremity was performed according to the standard protocol. RADIATION DOSE REDUCTION: This exam was performed according to the departmental dose-optimization program which includes automated exposure control, adjustment of the mA and/or kV according to patient size and/or use of iterative reconstruction technique. COMPARISON:  CT 05/12/2022  FINDINGS: LEFT: Bones/Joint/Cartilage Demineralization. No acute fracture in the left femur, tibia, or fibula. No dislocation of the hip or knee. No knee joint effusion. Degenerative changes left hip and left knee. Ligaments Suboptimally assessed by CT. Muscles and Tendons Fatty atrophy of the musculature of the thigh and lower leg. Soft tissues Diffuse edema through the soft tissues of the left lower extremity extending from the level of the inferior along the lateral and medial thigh answer from frontally about the knee and calf. RIGHT: Bones/Joint/Cartilage Demineralization. No acute fracture in the right femur, tibia, or fibula. No dislocation of the hip or knee. No knee joint effusion. Degenerative changes right hip and right knee. Soft tissue wound about the anterolateral proximal calf with overlying dressing material. Dressing material extends into the subcutaneous fat in the area of a gas and fluid collection which measures 5.4 x 2.0 cm in the axial plane and extends inferiorly approximately 9.1 cm. Posterior to the gas and fluid collection there is a heterogenous intermediate density collection which extends from the posterolateral distal thigh into the posterolateral proximal calf. This measures 10.1 x 3.6 cm in the axial plane and encompasses a craniocaudal dimension of 25.9 cm. Ligaments Suboptimally assessed by CT. Muscles and Tendons Fatty atrophy of the musculature of the thigh and lower leg. Soft tissues Diffuse edema through the soft tissues of the right lower extremity extending from the level of hip inferiorly along the lateral and medial thigh and circumferentially throughout the knee and calf. This is not substantially changed from 05/12/2022. Heterogenous loculated collection along the medial tibia proximally the collection measures 8.9 x 3.6 in the axial plane, previously 10.0 x 4.2 cm using similar measuring technique. This extends for a length of approximately 11.0 cm, previously 13.1 cm.  The collection again demonstrates heterogenous intermediate density without definite wall thickening. Given slight decrease in size and heterogenous intermediate density this is favored to represent a hematoma. IMPRESSION: RIGHT: 1. Slightly decreased size of the heterogenous intermediate density fluid collection in the medial/proximal calf favored to represent a hematoma. Abscess or soft tissue mass are differential considerations but considered less likely. 2. Unchanged diffuse soft tissue edema throughout the right leg. LEFT: 1. Soft tissue wound about the lateral proximal calf with overlying dressing material extend into a 5.4 x 2.0 x 9.1 cm gas and fluid collection. Differential considerations include abscess or hematoma with or without superimposed infection. 2. Additional large 10.1 x 3.6 x 25.9 cm intermediate density fluid collection in the posterolateral left leg centered about the knee consistent with hematoma. Differential considerations are soft tissue mass or abscess but these are considered less likely. 3. Diffuse soft tissue edema throughout the right leg. Electronically Signed   By: Minerva Fester M.D.   On: 06/22/2022 18:05   Physical  Examination: Vitals:   06/22/22 2104 06/22/22 2106 06/22/22 2121 06/22/22 2218  BP: (!) 117/58  123/67 119/79  Pulse: 98  96 87  Temp: 98.8 F (37.1 C) 98.5 F (36.9 C) 98.5 F (36.9 C) 98.6 F (37 C)  Resp: 19  18 20   SpO2: 100%  100% 100%  TempSrc: Oral Oral Oral Oral   Physical Exam Vitals and nursing note reviewed.  Constitutional:      General: He is awake.     Appearance: He is obese. He is ill-appearing.  HENT:     Head: Normocephalic.     Right Ear: External ear normal. Decreased hearing noted.     Left Ear: External ear normal. Decreased hearing noted.     Mouth/Throat:     Lips: Pink.     Mouth: Mucous membranes are moist.     Tongue: Tongue does not deviate from midline.  Eyes:     General: Lids are normal.     Extraocular  Movements: Extraocular movements intact.     Pupils: Pupils are equal, round, and reactive to light.  Cardiovascular:     Pulses:          Dorsalis pedis pulses are 2+ on the right side and detected w/ Doppler on the left side.       Posterior tibial pulses are 2+ on the right side and detected w/ Doppler on the left side.  Pulmonary:     Effort: Pulmonary effort is normal.     Breath sounds: Examination of the right-lower field reveals wheezing. Examination of the left-lower field reveals wheezing. Wheezing present.  Musculoskeletal:     Right lower leg: 1+ Edema present.     Left lower leg: 1+ Edema present.  Neurological:     Mental Status: He is alert.  Psychiatric:        Behavior: Behavior is cooperative.     Assessment and Plan: * Leg hematoma, left, initial encounter EDMD has contacted vascular and I have ordered in chart.  Wound care. Ortho as needed. Neuro and pulse check q2 hour overnight.  Pt given [place].     Lactic acidosis Attribute to patient's low blood pressure, secondary to symptomatic anemia. In combination with antihypertensive regimen and diuretic therapy AKI and lactic acidosis. Do not suspect any infection. Will continue to trend lactic until resolution. CPK normal limits.   Hypothyroid Cont levothyroxine.   Prediabetes We will get an A1c.   OSA (obstructive sleep apnea) Cpap per home setting for his OSA.  Benign hypertension with chronic kidney disease, stage III (HCC) Vitals:   06/22/22 1600 06/22/22 1630 06/22/22 1737 06/22/22 1756  BP: (!) 99/49 (!) 99/59 (!) 99/40 (!) 101/54   06/22/22 1800 06/22/22 1826 06/22/22 1916 06/22/22 2015  BP: (!) 91/43 102/60 (!) 101/59 (!) 109/53   06/22/22 2100 06/22/22 2104 06/22/22 2121 06/22/22 2218  BP: (!) 117/58 (!) 117/58 123/67 119/79  We will hold all bp meds.  Patient is on home regimen with Aldactone, metoprolol. Patient is also on diuretic therapy with torsemide all 3 of which we will  hold. Due to AKI hypotension and lactic acidosis.     Acute renal failure superimposed on stage 3a chronic kidney disease (HCC) Lab Results  Component Value Date   CREATININE 2.36 (H) 06/22/2022   CREATININE 1.00 06/20/2022   CREATININE 1.13 05/12/2022  We will currently hold metoprolol. Will also hold Eliquis due to patient's extensive bleeding hematoma and symptomatic anemia. Will continue patient on his  amiodarone.    COPD (chronic obstructive pulmonary disease) (HCC) Basilar wheezing on exam.  PRN albuterol.  SpO2: 100 %   Essential hypertension Vitals:   06/22/22 2104 06/22/22 2106 06/22/22 2121 06/22/22 2218  BP: (!) 117/58  123/67 119/79  Pulse: 98  96 87  Temp: 98.8 F (37.1 C) 98.5 F (36.9 C) 98.5 F (36.9 C) 98.6 F (37 C)  Resp: 19  18 20   SpO2: 100%  100% 100%  TempSrc: Oral Oral Oral Oral  Hold metoprolol.    Atrial fibrillation (HCC) Currenely in rate controlled a.fib.  Pt sees Dr. Lady Gary for his heart.  See EKG :      DVT prophylaxis:  None.   Code Status:  Full code.      06/22/2022    2:19 PM  Advanced Directives  Does Patient Have a Medical Advance Directive? Yes  Type of Advance Directive Out of facility DNR (pink MOST or yellow form)    Family Communication:  Full code.   Emergency Contact: Contact Information     Name Relation Home Work [place] E Significant other [phone]  [phone]       Disposition Plan:  [facility] [place].    Consults: Vascular cosnutl: dr. Marena Chancy.   Admission status: Inpatent.    Unit / Expected LOS:  Med tekl  Gertha Calkin MD [facility]  6 PM- 2 AM. [phone]( Pager )  For questions regarding this patient please use [email or web address] to contact the current [facility] MD.   Bonita Quin may also call [phone] to contact current Assigned [facility] Attending/Consulting MD for this patient.

## 2022-06-22 NOTE — Assessment & Plan Note (Signed)
We will get an A1c.  

## 2022-06-22 NOTE — Assessment & Plan Note (Addendum)
Vitals:   06/22/22 2104 06/22/22 2106 06/22/22 2121 06/22/22 2218  BP: (!) 117/58  123/67 119/79  Pulse: 98  96 87  Temp: 98.8 F (37.1 C) 98.5 F (36.9 C) 98.5 F (36.9 C) 98.6 F (37 C)  Resp: 19  18 20   SpO2: 100%  100% 100%  TempSrc: Oral Oral Oral Oral  Hold metoprolol.

## 2022-06-22 NOTE — ED Notes (Signed)
MD at bedside. 

## 2022-06-22 NOTE — ED Notes (Signed)
RN from [facility] called to report critical results: potassium of 6.5 and hemoglobin of 5.8.

## 2022-06-22 NOTE — Assessment & Plan Note (Addendum)
Lab Results  Component Value Date   CREATININE 2.36 (H) 06/22/2022   CREATININE 1.00 06/20/2022   CREATININE 1.13 05/12/2022  We will currently hold metoprolol. Will also hold Eliquis due to patient's extensive bleeding hematoma and symptomatic anemia. Will continue patient on his amiodarone.

## 2022-06-22 NOTE — Assessment & Plan Note (Signed)
Hold Eliquis.  Vascular surgery seen and planning for hematoma evacuation tomorrow in the OR.  Patient was given Theodoro Parma in the ED

## 2022-06-22 NOTE — ED Triage Notes (Signed)
Pt to ED via [facility] from [facility] for abnormal labs and wound on his left leg. Pt seen last night for same. Lab results that were sent with pt dated 06/21/22 at 1700. Pts K+ was 6.5. [facility] also requesting that pts legs be checked again.

## 2022-06-22 NOTE — ED Notes (Signed)
Pt to ct 

## 2022-06-22 NOTE — Hospital Course (Addendum)
82 year old male with a known history of A-fib on Eliquis, COPD, CHF, anemia is admitted for left lower extremity hematoma burst  5/9: Vascular surgery consult.  Plan for hematoma evacuation in the OR tomorrow 5/10: Drainage of hematoma, right IJ triple-lumen catheter and application of wound VAC 5/11: Added Keflex for potential infected hematoma as he has leukocytosis.  Central line and Foley was removed. 5/12: Wound culture growing Staph aureus and procidentia that lab could not confirm if it is MRSA or MSSA yet 5/13: Wound culture confirmed MRSA and providencia.  Switched antibiotic to oral Zyvox and Cipro based on sensitivities.  Wound VAC dressing change by vascular surgery today. 5/15: Patient was apparently discharged yesterday, unable to leave due to some nursing issues at facility.  Vitals remained stable. Patient is being discharged on Cipro and Zyvox for 10 more days based on sensitivity results.

## 2022-06-22 NOTE — Assessment & Plan Note (Signed)
Stable.  On room air

## 2022-06-22 NOTE — Assessment & Plan Note (Addendum)
Resolved with hydration.  Likely due to hypotension

## 2022-06-22 NOTE — Assessment & Plan Note (Signed)
Cont levothyroxine.   

## 2022-06-22 NOTE — ED Provider Notes (Addendum)
[facility] Provider Note    Event Date/Time   First MD Initiated Contact with Patient 06/22/22 1456     (approximate)   History   abnormal labs  and Wound Check   HPI  Daniel Stclair. is a 82 y.o. male  with pmh atrial fibrillation on Eliquis, COPD, CHF, anemia who presents for wound check.  About 5 days ago patient was being transferred via [facility] lift when he struck his left lower leg on the wheelchair causing a hematoma.  Currently last night the hematoma burst he was seen in the ER.  Did have drop in hemoglobin but serial hemoglobins were stable and patient was discharged.  Patient tells me that he saw the wound care doctor on Tuesday when he return to [facility] and they felt that the hematoma needed to be drained.  They wanted him to be reevaluated.  Apparently he also had blood work that showed hyperkalemia.  Patient does endorse ongoing pain and worsening swelling in the leg.  Denies numbness or tingling.  Denies any blood in his stool or black stool or other bleeding.  Has had blood transfusions in the past.       Past Medical History:  Diagnosis Date   A-fib (HCC)    Cellulitis    CHF (congestive heart failure) (HCC)    COPD (chronic obstructive pulmonary disease) (HCC)    Difficult intubation    Hyperlipidemia    Hypertension    Hypothyroidism    Kidney disease    Sleep apnea    Stroke [facility])    Thyroid disease     Patient Active Problem List   Diagnosis Date Noted   Macrocytic anemia 06/06/2022   Cellulitis of left lower extremity 02/14/2021   COPD (chronic obstructive pulmonary disease) (HCC)    Hypothyroidism    Stroke (HCC)    Acute renal failure superimposed on stage 3a chronic kidney disease (HCC)    Elevated troponin    Normocytic anemia    Generalized weakness    Acute renal failure superimposed on chronic kidney disease (HCC)    Myalgia due to statin 12/18/2019   Atrial fibrillation (HCC) 04/09/2019   Essential  hypertension 04/09/2019   Venous stasis dermatitis of both lower extremities 04/09/2019   Lower limb ulcer, ankle, right, limited to breakdown of skin (HCC) 04/09/2019   Rhabdomyolysis 12/10/2018   Impairment of balance 10/02/2018   Physical deconditioning 10/02/2018   Sepsis (HCC) 07/28/2018   Primary osteoarthritis of right knee 09/22/2017   Prediabetes 07/14/2017   Healthcare maintenance 03/16/2017   Aortic atherosclerosis (HCC) 03/15/2016   Cough 03/20/2015   Bronchitis, chronic obstructive w acute bronchitis (HCC) 03/20/2015   Umbilical hernia without obstruction and without gangrene 01/12/2015   Ventral hernia without obstruction or gangrene 01/02/2015   Morbid obesity with BMI of 45.0-49.9, adult (HCC) 11/23/2013   Achalasia 06/30/2013   Benign hypertension with chronic kidney disease, stage III (HCC) 06/30/2013   Cardiomyopathy (HCC) 06/30/2013   OSA (obstructive sleep apnea) 06/30/2013   Hypothyroid 06/30/2013     Physical Exam  Triage Vital Signs: ED Triage Vitals  Enc Vitals Group     BP 06/22/22 1403 (!) 91/53     Pulse Rate 06/22/22 1403 (!) 102     Resp 06/22/22 1403 18     Temp 06/22/22 1403 99.1 F (37.3 C)     Temp Source 06/22/22 1403 Oral     SpO2 06/22/22 1403 95 %  Weight --      Height --      Head Circumference --      Peak Flow --      Pain Score 06/22/22 1416 8     Pain Loc --      Pain Edu? --      Excl. in GC? --     Most recent vital signs: Vitals:   06/22/22 1800 06/22/22 1826  BP: (!) 91/43 102/60  Pulse: 78   Resp: 12 (!) 21  Temp:    SpO2: 100%      General: Awake, no distress.  Chronically ill-appearing CV:  Good peripheral perfusion.  Resp:  Normal effort.  Abd:  No distention.  Neuro:             Awake, Alert, Oriented x 3  Other:  Large hematoma extending from the distal calf up into the mid thigh with significant swelling and posterior induration compartment is soft there is an open wound on the lateral mid calf with  some clotted blood but no active bleeding 2+ DP pulse   ED Results / Procedures / Treatments  Labs (all labs ordered are listed, but only abnormal results are displayed) Labs Reviewed  CBC WITH DIFFERENTIAL/PLATELET - Abnormal; Notable for the following components:      Result Value   WBC 16.9 (*)    RBC 1.59 (*)    Hemoglobin 5.4 (*)    HCT 17.1 (*)    MCV 107.5 (*)    RDW 17.2 (*)    nRBC 0.5 (*)    Neutro Abs 13.9 (*)    Monocytes Absolute 1.2 (*)    Abs Immature Granulocytes 0.14 (*)    All other components within normal limits  COMPREHENSIVE METABOLIC PANEL - Abnormal; Notable for the following components:   Sodium 131 (*)    Potassium 5.6 (*)    CO2 21 (*)    Glucose, Bld 109 (*)    BUN 41 (*)    Creatinine, Ser 2.36 (*)    Calcium 8.1 (*)    Total Protein 5.3 (*)    Albumin 2.8 (*)    GFR, Estimated 27 (*)    All other components within normal limits  PROTIME-INR  APTT  TYPE AND SCREEN  PREPARE RBC (CROSSMATCH)     EKG     RADIOLOGY I reviewed interpreted the CT of the left lower extremity which shows a large posterior hematoma   PROCEDURES:  Critical Care performed: Yes, see critical care procedure note(s)  Procedures  The patient is on the cardiac monitor to evaluate for evidence of arrhythmia and/or significant heart rate changes.   MEDICATIONS ORDERED IN ED: Medications  0.9 %  sodium chloride infusion (has no administration in time range)  0.9 %  sodium chloride infusion (has no administration in time range)  prothrombin complex conc human (KCENTRA) IVPB 5,000 Units (has no administration in time range)  lactated ringers bolus 500 mL (0 mLs Intravenous Stopped 06/22/22 1747)  insulin aspart (novoLOG) injection 10 Units (10 Units Intravenous Given 06/22/22 1716)  dextrose 50 % solution 25 g (25 g Intravenous Given 06/22/22 1715)  HYDROmorphone (DILAUDID) injection 0.5 mg (0.5 mg Intravenous Given 06/22/22 1638)  ondansetron (ZOFRAN) injection 4 mg  (4 mg Intravenous Given 06/22/22 1644)     IMPRESSION / MDM / ASSESSMENT AND PLAN / ED COURSE  I reviewed the triage vital signs and the nursing notes.  Patient's presentation is most consistent with acute presentation with potential threat to life or bodily function.  Differential diagnosis includes, but is not limited to, expanding hematoma, compartment syndrome, acute blood loss anemia,  The patient is a 82 year old male with multiple medical comorbidities including atrial fibrillation on Eliquis who presents for a wound check.  Several days ago when he was being lifted in a [facility] lift he bumped the left leg on a wheelchair and developed a significant hematoma which burst 2 nights ago.  Was seen in the ED noted to have hemoglobin drop but was stable on recheck and was felt that he could be discharged back to his facility.  He did wound care physician at facility wanted him to be reevaluated again and apparently debrided the hematoma.  Since sugars borderline low he is tachycardic to the 1 teens.  Does have a fairly large hematoma that extends from the calf up to the mid thigh there is significant swelling and induration posteriorly but compartment is soft he has a good pulse.  There is an open wound with a hematoma burst on the lateral calf with some clotted blood but no active bleeding.  His hemoglobin notably is 5.4 from 8.7 yesterday.  He denies other blood loss I suspect that he is bleeding into the leg.  Will transfuse 2 units of blood.  Will also obtain CT of the leg to evaluate for the size and depth of hematoma.  Patient will require admission.   CT does show a large hematoma.  This was without contrast so unable to see if there is active extravasation.  Patient's blood pressure somewhat soft.  I did discuss with Dr. Gilda Crease with vascular who recommends reversing the Eliquis and patient consents to this so we will give Kcentra.    Patient responded well to  first unit of blood with hemoglobin on recheck 6.4.  Did check CK and lactate as hospitalist was concern for possible compartment syndrome.  Lactate is mildly elevated at 2.9 but I think this could be in part related to his anemia as his blood pressure is on the low end.  CK is normal.  On recheck patient's DP pulses not as clearly palpable however I am still able to palpate a faint pulse and he does have a good Doppler signal, and I think it is more difficult to feel due to the edema in his lower leg and foot especially now that the lower leg is wrapped..  Patient has good cap refill in the foot is able to move the foot and is not having any numbness or tingling.  The anterior thigh and calf compartments are soft.  Posterior thigh does have significant pain with palpation due to the hematoma but does not feel like a compartment syndrome to me at this time.     FINAL CLINICAL IMPRESSION(S) / ED DIAGNOSES   Final diagnoses:  Hematoma  Anemia, unspecified type  AKI (acute kidney injury) (HCC)     Rx / DC Orders   ED Discharge Orders     None        Note:  This document was prepared using Dragon voice recognition software and may include unintentional dictation errors.   Georga Hacking, MD 06/22/22 1859    Georga Hacking, MD 06/22/22 2115

## 2022-06-22 NOTE — Assessment & Plan Note (Addendum)
Rate is controlled for now.  Unable to give Eliquis due to large hematoma and anemia

## 2022-06-23 ENCOUNTER — Encounter: Payer: Self-pay | Admitting: Internal Medicine

## 2022-06-23 DIAGNOSIS — N179 Acute kidney failure, unspecified: Secondary | ICD-10-CM | POA: Diagnosis not present

## 2022-06-23 DIAGNOSIS — I10 Essential (primary) hypertension: Secondary | ICD-10-CM | POA: Diagnosis not present

## 2022-06-23 DIAGNOSIS — Z7901 Long term (current) use of anticoagulants: Secondary | ICD-10-CM

## 2022-06-23 DIAGNOSIS — I4891 Unspecified atrial fibrillation: Secondary | ICD-10-CM

## 2022-06-23 DIAGNOSIS — Z87891 Personal history of nicotine dependence: Secondary | ICD-10-CM | POA: Diagnosis not present

## 2022-06-23 DIAGNOSIS — J449 Chronic obstructive pulmonary disease, unspecified: Secondary | ICD-10-CM | POA: Diagnosis not present

## 2022-06-23 DIAGNOSIS — N1831 Chronic kidney disease, stage 3a: Secondary | ICD-10-CM

## 2022-06-23 DIAGNOSIS — S8012XA Contusion of left lower leg, initial encounter: Secondary | ICD-10-CM | POA: Diagnosis not present

## 2022-06-23 LAB — BASIC METABOLIC PANEL
Anion gap: 13 (ref 5–15)
BUN: 44 mg/dL — ABNORMAL HIGH (ref 8–23)
CO2: 23 mmol/L (ref 22–32)
Calcium: 8.1 mg/dL — ABNORMAL LOW (ref 8.9–10.3)
Chloride: 93 mmol/L — ABNORMAL LOW (ref 98–111)
Creatinine, Ser: 2.34 mg/dL — ABNORMAL HIGH (ref 0.61–1.24)
GFR, Estimated: 27 mL/min — ABNORMAL LOW (ref >60)
Glucose, Bld: 86 mg/dL (ref 70–99)
Potassium: 5.2 mmol/L — ABNORMAL HIGH (ref 3.5–5.1)
Sodium: 129 mmol/L — ABNORMAL LOW (ref 135–145)

## 2022-06-23 LAB — COMPREHENSIVE METABOLIC PANEL
ALT: 13 U/L (ref 0–44)
AST: 25 U/L (ref 15–41)
Albumin: 2.9 g/dL — ABNORMAL LOW (ref 3.5–5.0)
Alkaline Phosphatase: 52 U/L (ref 38–126)
Anion gap: 11 (ref 5–15)
BUN: 42 mg/dL — ABNORMAL HIGH (ref 8–23)
CO2: 23 mmol/L (ref 22–32)
Calcium: 8.2 mg/dL — ABNORMAL LOW (ref 8.9–10.3)
Chloride: 96 mmol/L — ABNORMAL LOW (ref 98–111)
Creatinine, Ser: 2.32 mg/dL — ABNORMAL HIGH (ref 0.61–1.24)
GFR, Estimated: 27 mL/min — ABNORMAL LOW (ref >60)
Glucose, Bld: 99 mg/dL (ref 70–99)
Potassium: 5.5 mmol/L — ABNORMAL HIGH (ref 3.5–5.1)
Sodium: 130 mmol/L — ABNORMAL LOW (ref 135–145)
Total Bilirubin: 1.2 mg/dL (ref 0.3–1.2)
Total Protein: 5.4 g/dL — ABNORMAL LOW (ref 6.5–8.1)

## 2022-06-23 LAB — CBC
HCT: 24.1 % — ABNORMAL LOW (ref 39.0–52.0)
Hemoglobin: 7.7 g/dL — ABNORMAL LOW (ref 13.0–17.0)
MCH: 32.1 pg (ref 26.0–34.0)
MCHC: 32 g/dL (ref 30.0–36.0)
MCV: 100.4 fL — ABNORMAL HIGH (ref 80.0–100.0)
Platelets: 259 10*3/uL (ref 150–400)
RBC: 2.4 MIL/uL — ABNORMAL LOW (ref 4.22–5.81)
RDW: 19.9 % — ABNORMAL HIGH (ref 11.5–15.5)
WBC: 17.2 10*3/uL — ABNORMAL HIGH (ref 4.0–10.5)
nRBC: 0.6 % — ABNORMAL HIGH (ref 0.0–0.2)

## 2022-06-23 LAB — PROTIME-INR
INR: 1.8 — ABNORMAL HIGH (ref 0.8–1.2)
Prothrombin Time: 21.3 seconds — ABNORMAL HIGH (ref 11.4–15.2)

## 2022-06-23 LAB — TYPE AND SCREEN: Unit division: 0

## 2022-06-23 LAB — APTT: aPTT: 36 seconds (ref 24–36)

## 2022-06-23 LAB — HEMOGLOBIN A1C
Hgb A1c MFr Bld: 4.5 % — ABNORMAL LOW (ref 4.8–5.6)
Mean Plasma Glucose: 82.45 mg/dL

## 2022-06-23 LAB — BPAM RBC
Blood Product Expiration Date: 202406032359
ISSUE DATE / TIME: 202405081942

## 2022-06-23 LAB — LACTIC ACID, PLASMA: Lactic Acid, Venous: 1.9 mmol/L (ref 0.5–1.9)

## 2022-06-23 LAB — MRSA NEXT GEN BY PCR, NASAL: MRSA by PCR Next Gen: DETECTED — AB

## 2022-06-23 MED ORDER — SODIUM CHLORIDE 0.9 % IV SOLN: INTRAVENOUS | Status: DC

## 2022-06-23 MED ORDER — SODIUM CHLORIDE 0.9 % IV SOLN: INTRAVENOUS | Status: AC

## 2022-06-23 MED ORDER — METHYLPREDNISOLONE SODIUM SUCC 125 MG IJ SOLR
125.0000 mg | Freq: Once | INTRAMUSCULAR | Status: AC | PRN
  Administered 2022-06-24: 125 mg via INTRAVENOUS

## 2022-06-23 MED ORDER — MIDAZOLAM HCL 2 MG/ML PO SYRP: 8.0000 mg | ORAL_SOLUTION | Freq: Once | ORAL | Status: DC | PRN

## 2022-06-23 MED ORDER — FAMOTIDINE 20 MG PO TABS: 40.0000 mg | ORAL_TABLET | Freq: Once | ORAL | Status: DC | PRN

## 2022-06-23 MED ORDER — CEFAZOLIN SODIUM-DEXTROSE 2-4 GM/100ML-% IV SOLN
2.0000 g | INTRAVENOUS | Status: AC
  Administered 2022-06-24: 2 g via INTRAVENOUS

## 2022-06-23 MED ORDER — DIPHENHYDRAMINE HCL 50 MG/ML IJ SOLN: 50.0000 mg | Freq: Once | INTRAMUSCULAR | Status: DC | PRN

## 2022-06-23 MED ORDER — SODIUM ZIRCONIUM CYCLOSILICATE 10 G PO PACK
10.0000 g | PACK | Freq: Once | ORAL | Status: AC
  Administered 2022-06-23: 10 g via ORAL
  Filled 2022-06-23: qty 1

## 2022-06-23 NOTE — Consult Note (Signed)
Hospital Consult    Reason for Consult:  Leg Hematoma Requesting Physician:  Dr Augusto Gamble MD MRN #:  [id number]  History of Present Illness: This is a 82 y.o. male with pmh atrial fibrillation on Eliquis, COPD, CHF, anemia who presents to [facility] ED for wound check.   About 5 days ago patient was being transferred via [facility] lift when he struck his left lower leg on the wheelchair causing a hematoma. Two nights ago the hematoma burst he was seen in the ER.  Did have drop in hemoglobin but serial hemoglobins were stable and patient was discharged.   Patient tells me that he saw the wound care doctor on Tuesday when he return to [facility] and they felt that the hematoma needed to be drained.  They wanted him to be reevaluated.  Apparently he also had blood work that showed hyperkalemia.  Patient does endorse ongoing pain and worsening swelling in the leg.      Past Medical History:  Diagnosis Date   A-fib (HCC)    Cellulitis    CHF (congestive heart failure) (HCC)    COPD (chronic obstructive pulmonary disease) (HCC)    Difficult intubation    Hyperlipidemia    Hypertension    Hypothyroidism    Kidney disease    Sleep apnea    Stroke [facility])    Thyroid disease     Past Surgical History:  Procedure Laterality Date   CARDIAC CATHETERIZATION     CATARACT EXTRACTION W/ INTRAOCULAR LENS  IMPLANT, BILATERAL     COLONOSCOPY  2008   HERNIA REPAIR     bilateral inguinal hernia/ [facility]   HERNIA REPAIR  02/02/2015   18 x 28 cm ventral light mesh placed laparoscopically.   TONSILLECTOMY     UMBILICAL HERNIA REPAIR N/A 02/02/2015   Procedure: HERNIA REPAIR UMBILICAL ADULT;  Surgeon: Earline Mayotte, MD;  Location: [facility] ORS;  Service: General;  Laterality: N/A;   VENTRAL HERNIA REPAIR N/A 02/02/2015   Procedure: LAPAROSCOPIC VENTRAL HERNIA;  Surgeon: Earline Mayotte, MD;  Location: [facility] ORS;  Service: General;  Laterality: N/A;   vp shunt placement  1979    Allergies  Allergen  Reactions   Diltiazem Hcl Itching and Other (See Comments)    edema    Prior to Admission medications   Medication Sig Start Date End Date Taking? Authorizing Provider  acetaminophen (TYLENOL) 500 MG tablet Take 500 mg by mouth every 12 (twelve) hours.   Yes [provider]  acidophilus (RISAQUAD) CAPS capsule Take 1 capsule by mouth 2 (two) times daily.   Yes [provider]  albuterol (VENTOLIN HFA) 108 (90 Base) MCG/ACT inhaler Inhale 2 puffs into the lungs every 6 (six) hours as needed for wheezing or shortness of breath. 02/17/21  Yes Darlin Priestly, MD  amiodarone (PACERONE) 200 MG tablet Take 200 mg by mouth daily. 02/20/14  Yes [provider]  apixaban (ELIQUIS) 5 MG TABS tablet Take 5 mg by mouth 2 (two) times daily.   Yes [provider]  Capsaicin (ASPERCREME PAIN RELIEF PATCH EX) Apply topically every 12 (twelve) hours.   Yes [provider]  Carboxymethylcellulose Sod PF (REFRESH CELLUVISC) 1 % GEL Apply 1 drop to eye every 12 (twelve) hours. Right eye   Yes [provider]  ferrous sulfate 325 (65 FE) MG tablet Take 1 tablet (325 mg total) by mouth daily. 12/09/21 06/22/22 Yes Chesley Noon, MD  Fluticasone-Umeclidin-Vilant (TRELEGY ELLIPTA) 100-62.5-25 MCG/INH AEPB Inhale 1 puff  into the lungs daily.   Yes [provider]  HYDROcodone-acetaminophen (NORCO/VICODIN) 5-325 MG tablet Take 1 tablet by mouth every 8 (eight) hours as needed for moderate pain.   Yes [provider]  levothyroxine (SYNTHROID) 88 MCG tablet Take 88 mcg by mouth daily.  02/20/14  Yes [provider]  loratadine (CLARITIN) 10 MG tablet Take 10 mg by mouth daily.   Yes [provider]  metoprolol succinate (TOPROL-XL) 50 MG 24 hr tablet Take 50 mg by mouth daily. Take with or immediately following a meal.   Yes [provider]  Multiple Vitamin (MULTI-VITAMINS) TABS Take 1 tablet by mouth daily.   Yes [provider]  omeprazole (PRILOSEC) 20 MG capsule Take 20 mg by mouth daily. 02/20/14  Yes [provider]  polyethylene glycol (MIRALAX / GLYCOLAX) 17 g packet Take 17 g by mouth daily as needed. Patient taking differently: Take 17 g by mouth daily as needed for mild constipation. 08/03/18  Yes Ojie, Jude, MD  potassium chloride SA (KLOR-CON M) 20 MEQ tablet Take 20 mEq by mouth daily.   Yes [provider]  sertraline (ZOLOFT) 50 MG tablet Take 50 mg by mouth daily.   Yes [provider]  spironolactone (ALDACTONE) 25 MG tablet Take 25 mg by mouth daily.   Yes [provider]  torsemide (DEMADEX) 20 MG tablet Hold pending outpatient followup due to acute kidney injury. Patient taking differently: Take 20 mg by mouth daily. 02/17/21  Yes Darlin Priestly, MD  traZODone (DESYREL) 50 MG tablet Take 25 mg by mouth at bedtime.   Yes [provider]  albuterol (VENTOLIN HFA) 108 (90 Base) MCG/ACT inhaler Inhale 2 puffs into the lungs every 6 (six) hours as needed for wheezing. Patient not taking: Reported on 06/22/2022    [provider]  ammonium lactate (LAC-HYDRIN) 12 % lotion Apply 1 Application topically as needed for dry skin. Patient not taking: Reported on 06/06/2022    [provider]  guaiFENesin (MUCINEX) 600 MG 12 hr tablet Take 600 mg by mouth See admin instructions. Bid x 5 days Patient not taking: Reported on 08/24/2021    [provider]  hydrOXYzine (ATARAX) 25 MG tablet Take 25 mg by mouth See admin instructions. Every 12 hours x 30 days for pruritis Patient not taking: Reported on 06/06/2022    [provider]  metolazone (ZAROXOLYN) 2.5 MG tablet Hold pending outpatient followup due to acute kidney injury. Patient not taking: Reported on 03/06/2021 02/17/21   Darlin Priestly, MD  Multiple Vitamins-Minerals (DECUBI-VITE PO) Take 1 capsule by mouth daily. 400 mcg -50 mg Patient not taking: Reported on 06/22/2022    [provider]   Omega-3 Fatty Acids (OMEGA 3 PO) Take 2,000 mg by mouth daily. Patient not taking: Reported on 06/22/2022    [provider]  predniSONE (DELTASONE) 20 MG tablet Take 40 mg by mouth See admin instructions. Qd x 5 days Patient not taking: Reported on 08/24/2021    [provider]    Social History   Socioeconomic History   Marital status: Divorced    Spouse name: Not on file   Number of children: Not on file   Years of education: Not on file   Highest education level: Not on file  Occupational History   Not on file  Tobacco Use   Smoking status: Former    Packs/day: 1.00    Years: 41.00    Additional pack years: 0.00  Total pack years: 41.00    Types: Cigarettes    Quit date: 68    Years since quitting: 25.3   Smokeless tobacco: Never  Vaping Use   Vaping Use: Never used  Substance and Sexual Activity   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   Drug use: No   Sexual activity: Not Currently  Other Topics Concern   Not on file  Social History Narrative   Not on file   Social Determinants of Health   Financial Resource Strain: Not on file  Food Insecurity: No Food Insecurity (06/06/2022)   Hunger Vital Sign    Worried About Running Out of Food in the Last Year: Never true    Ran Out of Food in the Last Year: Never true  Transportation Needs: No Transportation Needs (06/06/2022)   PRAPARE - Administrator, Civil Service (Medical): No    Lack of Transportation (Non-Medical): No  Physical Activity: Not on file  Stress: Not on file  Social Connections: Not on file  Intimate Partner Violence: Not At Risk (06/06/2022)   Humiliation, Afraid, Rape, and Kick questionnaire    Fear of Current or Ex-Partner: No    Emotionally Abused: No    Physically Abused: No    Sexually Abused: No     Family History  Problem Relation Age of Onset   Heart disease Mother    Alcohol abuse Father    Bladder Cancer Sister     ROS: Otherwise  negative unless mentioned in HPI  Physical Examination  Vitals:   06/23/22 0510 06/23/22 0700  BP:  99/78  Pulse:  93  Resp:  20  Temp: 98.5 F (36.9 C)   SpO2:  97%   There is no height or weight on file to calculate BMI.  General:  WDWN in NAD Gait: Not observed HENT: WNL, normocephalic Pulmonary: normal non-labored breathing, without Rales, rhonchi,  wheezing Cardiac: irregular, without  Murmurs, rubs or gallops; without carotid bruits Abdomen: Positive Bowel Sounds, soft, NT/ND, no masses Skin: without rashes Vascular Exam/Pulses: Unable to palpate PT/DP on the left lower extremity due to swelling.  Extremities: without ischemic changes, without Gangrene , with cellulitis; with open wounds;  Musculoskeletal: no muscle wasting or atrophy  Neurologic: A&O X 3;  No focal weakness or paresthesias are detected; speech is fluent/normal Psychiatric:  The pt has Normal affect. Lymph:  Unremarkable  CBC    Component Value Date/Time   WBC 17.2 (H) 06/23/2022 0522   RBC 2.40 (L) 06/23/2022 0522   HGB 7.7 (L) 06/23/2022 0522   HGB 15.9 06/18/2012 1151   HCT 24.1 (L) 06/23/2022 0522   HCT 47.2 06/18/2012 1151   PLT 259 06/23/2022 0522   PLT 285 06/18/2012 1151   MCV 100.4 (H) 06/23/2022 0522   MCV 99 06/18/2012 1151   MCH 32.1 06/23/2022 0522   MCHC 32.0 06/23/2022 0522   RDW 19.9 (H) 06/23/2022 0522   RDW 16.2 (H) 06/18/2012 1151   LYMPHSABS 1.7 06/22/2022 1415   LYMPHSABS 1.7 06/18/2012 1151   MONOABS 1.2 (H) 06/22/2022 1415   MONOABS 1.1 (H) 06/18/2012 1151   EOSABS 0.0 06/22/2022 1415   EOSABS 0.0 06/18/2012 1151   BASOSABS 0.0 06/22/2022 1415   BASOSABS 0.1 06/18/2012 1151    BMET    Component Value Date/Time   NA 130 (L) 06/23/2022 0522   NA 138 06/20/2012 0757   K 5.5 (H) 06/23/2022 0522   K 2.9 (L)  06/20/2012 0757   CL 96 (L) 06/23/2022 0522   CL 97 (L) 06/20/2012 0757   CO2 23 06/23/2022 0522   CO2 36 (H) 06/20/2012 0757   GLUCOSE 99 06/23/2022 0522    GLUCOSE 87 06/20/2012 0757   BUN 42 (H) 06/23/2022 0522   BUN 34 (H) 06/20/2012 0757   CREATININE 2.32 (H) 06/23/2022 0522   CREATININE 1.59 (H) 07/23/2013 1345   CALCIUM 8.2 (L) 06/23/2022 0522   CALCIUM 8.1 (L) 06/20/2012 0757   GFRNONAA 27 (L) 06/23/2022 0522   GFRNONAA 42 (L) 07/23/2013 1345   GFRAA >60 12/14/2018 0452   GFRAA 49 (L) 07/23/2013 1345    COAGS: Lab Results  Component Value Date   INR 1.8 (H) 06/23/2022   INR 2.3 (H) 06/22/2022   INR 1.9 (H) 07/29/2018     Non-Invasive Vascular Imaging:   EXAM: CT OF THE LOWER BILATERAL EXTREMITY WITHOUT CONTRAST   TECHNIQUE: Multidetector CT imaging of the right lower extremity was performed according to the standard protocol.  Diffuse edema through the soft tissues of the right lower extremity extending from the level of hip inferiorly along the lateral and medial thigh and circumferentially throughout the knee and calf. This is not substantially changed from 05/12/2022.   Heterogenous loculated collection along the medial tibia proximally the collection measures 8.9 x 3.6 in the axial plane, previously 10.0 x 4.2 cm using similar measuring technique. This extends for a length of approximately 11.0 cm, previously 13.1 cm. The collection again demonstrates heterogenous intermediate density without definite wall thickening. Given slight decrease in size and heterogenous intermediate density this is favored to represent a hematoma.   IMPRESSION: RIGHT:   1. Slightly decreased size of the heterogenous intermediate density fluid collection in the medial/proximal calf favored to represent a hematoma. Abscess or soft tissue mass are differential considerations but considered less likely. 2. Unchanged diffuse soft tissue edema throughout the right leg.   LEFT:   1. Soft tissue wound about the lateral proximal calf with overlying dressing material extend into a 5.4 x 2.0 x 9.1 cm gas and fluid collection.  Differential considerations include abscess or hematoma with or without superimposed infection. 2. Additional large 10.1 x 3.6 x 25.9 cm intermediate density fluid collection in the posterolateral left leg centered about the knee consistent with hematoma. Differential considerations are soft tissue mass or abscess but these are considered less likely. 3. Diffuse soft tissue edema throughout the right leg.  Statin:  No. Beta Blocker:  Yes.   Aspirin:  No. ACEI:  No. ARB:  No. CCB use:  No Other antiplatelets/anticoagulants:  Yes.   Eliquis 5 MG twice Daily   ASSESSMENT/PLAN: This is a 82 y.o. male who presents to [facility] ER post trauma to his left lower extremity that caused a hematoma. Hematoma burst as it increased due to patient being on eliquis at home.   PLAN: Vascular surgery plans on taking the patient to the operating room to evacuate left lower extremity abscess/hematoma.   -I discussed the plan in detail with Dr. Levora Dredge MD and he is in agreement with the plan   Marcie Bal [facility] 06/23/2022 7:48 AM

## 2022-06-23 NOTE — Progress Notes (Addendum)
  Progress Note   Patient: Daniel Eaton. MVH:[id number] DOB: Nov 08, 1940 DOA: 06/22/2022     1 DOS: the patient was seen and examined on 06/23/2022   Brief hospital course: 82 year old male with a known history of A-fib on Eliquis, COPD, CHF, anemia is admitted for left lower extremity hematoma burst  5/9: Vascular surgery consult.  Plan for hematoma evacuation in the OR tomorrow    Assessment and Plan: * Leg hematoma, left, initial encounter Hold Eliquis.  Vascular surgery seen and planning for hematoma evacuation tomorrow in the OR.  Patient was given Kcentra in the ED    Lactic acidosis Resolved with hydration.  Likely due to hypotension   Hypothyroid Cont levothyroxine  Prediabetes We will get an A1c.   OSA (obstructive sleep apnea) Cpap per home setting for his OSA.  Acute renal failure superimposed on stage 3a chronic kidney disease (HCC) Lab Results  Component Value Date   CREATININE 2.32 (H) 06/23/2022   CREATININE 2.36 (H) 06/22/2022   CREATININE 1.00 06/20/2022  Also has hyperkalemia -will order 10 g of Lokelma Holding diuretics due to worsening of kidney function.  Give IV fluids and monitor kidney function. nephrology consult    COPD (chronic obstructive pulmonary disease) (HCC) Stable.  On room air  Essential hypertension Hold metoprolol.  Blood pressure is controlled for now    Atrial fibrillation (HCC) Rate is controlled for now.  Unable to give Eliquis due to large hematoma and anemia         Subjective: He is hungry and hoping to get a bed upstairs.  He is also requesting that his hematoma is not opened without anesthesia or pain medication  Physical Exam: Vitals:   06/23/22 1200 06/23/22 1230 06/23/22 1243 06/23/22 1430  BP: 122/67 (!) 111/55  (!) 111/58  Pulse: 86 96  83  Resp: 17 15 20 13   Temp:   98.3 F (36.8 C) 98 F (36.7 C)  TempSrc:   Oral   SpO2: 96% 4%  45%   82 year old frail looking male lying in the bed  comfortably without any acute distress Lungs clear to auscultation bilaterally Heart irregularly irregular heart sounds Abdomen soft, benign, obese Extremities/skin: Large hematoma extending from the distal calf up to the mid thigh with significant swelling and induration.  He does have open wound on the left lateral mid calf with some clotted blood but no active bleeding. Neuro awake and alert, nonfocal Psych normal mood and affect Data Reviewed:  Creatinine 2.32, potassium 5.5  Family Communication: None at bedside  Disposition: Status is: Inpatient Remains inpatient appropriate because: Management of hematoma, AKI  Planned Discharge Destination: Rehab   DVT prophylaxis-SCDs Time spent: 35 minutes  Author: Delfino Lovett, MD 06/23/2022 5:13 PM  For on call review [email or web address].

## 2022-06-23 NOTE — ED Notes (Signed)
Pt assisted with meal tray, condiments opened, pt repositioned, call bell placed in pt's hand, pt denies any other needs.

## 2022-06-23 NOTE — ED Notes (Signed)
Pt assisted with meal tray, S/O at bedside. No other needs at this time. Call bell in pts hand.

## 2022-06-23 NOTE — Assessment & Plan Note (Addendum)
Lab Results  Component Value Date   CREATININE 2.12 (H) 06/24/2022   CREATININE 2.34 (H) 06/23/2022   CREATININE 2.32 (H) 06/23/2022  Also has hyperkalemia -will order 10 g of Lokelma Holding diuretics due to worsening of kidney function.  Give IV fluids and monitor kidney function. nephrology consult pending

## 2022-06-24 ENCOUNTER — Inpatient Hospital Stay: Payer: PPO | Admitting: Anesthesiology

## 2022-06-24 ENCOUNTER — Encounter: Payer: Self-pay | Admitting: Internal Medicine

## 2022-06-24 ENCOUNTER — Inpatient Hospital Stay: Payer: PPO

## 2022-06-24 ENCOUNTER — Other Ambulatory Visit: Payer: Self-pay

## 2022-06-24 ENCOUNTER — Encounter
Admission: EM | Disposition: A | Discharge status: Skilled Nursing Facility | Payer: Self-pay | Source: Home / Self Care | Attending: Internal Medicine

## 2022-06-24 DIAGNOSIS — I96 Gangrene, not elsewhere classified: Secondary | ICD-10-CM

## 2022-06-24 DIAGNOSIS — S8012XA Contusion of left lower leg, initial encounter: Secondary | ICD-10-CM | POA: Diagnosis not present

## 2022-06-24 DIAGNOSIS — E872 Acidosis, unspecified: Secondary | ICD-10-CM

## 2022-06-24 DIAGNOSIS — I10 Essential (primary) hypertension: Secondary | ICD-10-CM | POA: Diagnosis not present

## 2022-06-24 DIAGNOSIS — N179 Acute kidney failure, unspecified: Secondary | ICD-10-CM | POA: Diagnosis not present

## 2022-06-24 DIAGNOSIS — I878 Other specified disorders of veins: Secondary | ICD-10-CM

## 2022-06-24 HISTORY — PX: INCISION AND DRAINAGE: SHX5863

## 2022-06-24 HISTORY — PX: HEMATOMA EVACUATION: SHX5118

## 2022-06-24 HISTORY — PX: APPLICATION OF WOUND VAC: SHX5189

## 2022-06-24 LAB — CBC
HCT: 19.7 % — ABNORMAL LOW (ref 39.0–52.0)
HCT: 26.1 % — ABNORMAL LOW (ref 39.0–52.0)
Hemoglobin: 6.3 g/dL — ABNORMAL LOW (ref 13.0–17.0)
Hemoglobin: 8.5 g/dL — ABNORMAL LOW (ref 13.0–17.0)
MCH: 32.3 pg (ref 26.0–34.0)
MCH: 31.4 pg (ref 26.0–34.0)
MCHC: 32 g/dL (ref 30.0–36.0)
MCHC: 32.6 g/dL (ref 30.0–36.0)
MCV: 101 fL — ABNORMAL HIGH (ref 80.0–100.0)
MCV: 96.3 fL (ref 80.0–100.0)
Platelets: 208 10*3/uL (ref 150–400)
Platelets: 186 10*3/uL (ref 150–400)
RBC: 1.95 MIL/uL — ABNORMAL LOW (ref 4.22–5.81)
RBC: 2.71 MIL/uL — ABNORMAL LOW (ref 4.22–5.81)
RDW: 19.6 % — ABNORMAL HIGH (ref 11.5–15.5)
RDW: 19.6 % — ABNORMAL HIGH (ref 11.5–15.5)
WBC: 12.7 10*3/uL — ABNORMAL HIGH (ref 4.0–10.5)
WBC: 14.5 10*3/uL — ABNORMAL HIGH (ref 4.0–10.5)
nRBC: 0.6 % — ABNORMAL HIGH (ref 0.0–0.2)
nRBC: 1.1 % — ABNORMAL HIGH (ref 0.0–0.2)

## 2022-06-24 LAB — BASIC METABOLIC PANEL
Anion gap: 9 (ref 5–15)
BUN: 41 mg/dL — ABNORMAL HIGH (ref 8–23)
CO2: 23 mmol/L (ref 22–32)
Calcium: 7.9 mg/dL — ABNORMAL LOW (ref 8.9–10.3)
Chloride: 100 mmol/L (ref 98–111)
Creatinine, Ser: 2.12 mg/dL — ABNORMAL HIGH (ref 0.61–1.24)
GFR, Estimated: 30 mL/min — ABNORMAL LOW (ref >60)
Glucose, Bld: 94 mg/dL (ref 70–99)
Potassium: 4.7 mmol/L (ref 3.5–5.1)
Sodium: 132 mmol/L — ABNORMAL LOW (ref 135–145)

## 2022-06-24 LAB — PREPARE RBC (CROSSMATCH)

## 2022-06-24 LAB — TYPE AND SCREEN: Unit division: 0

## 2022-06-24 LAB — BPAM RBC

## 2022-06-24 LAB — PROTIME-INR
INR: 1.5 — ABNORMAL HIGH (ref 0.8–1.2)
Prothrombin Time: 17.9 seconds — ABNORMAL HIGH (ref 11.4–15.2)

## 2022-06-24 LAB — APTT: aPTT: 34 seconds (ref 24–36)

## 2022-06-24 SURGERY — EVACUATION HEMATOMA
Anesthesia: General | Site: Leg Lower | Laterality: Left

## 2022-06-24 MED ORDER — MORPHINE SULFATE (PF) 2 MG/ML IV SOLN
2.0000 mg | INTRAVENOUS | Status: DC | PRN
  Administered 2022-06-25 – 2022-06-26 (×2): 2 mg via INTRAVENOUS
  Filled 2022-06-24 (×2): qty 1

## 2022-06-24 MED ORDER — CHLORHEXIDINE GLUCONATE 4 % EX SOLN: 60.0000 mL | Freq: Once | CUTANEOUS | Status: DC

## 2022-06-24 MED ORDER — FENTANYL CITRATE (PF) 100 MCG/2ML IJ SOLN
25.0000 ug | INTRAMUSCULAR | Status: DC | PRN
  Administered 2022-06-24 (×3): 25 ug via INTRAVENOUS

## 2022-06-24 MED ORDER — FENTANYL CITRATE (PF) 100 MCG/2ML IJ SOLN
INTRAMUSCULAR | Status: AC
  Filled 2022-06-24: qty 2

## 2022-06-24 MED ORDER — BUPIVACAINE HCL (PF) 0.5 % IJ SOLN
INTRAMUSCULAR | Status: AC
  Filled 2022-06-24: qty 30

## 2022-06-24 MED ORDER — SUCCINYLCHOLINE CHLORIDE 200 MG/10ML IV SOSY
PREFILLED_SYRINGE | INTRAVENOUS | Status: DC | PRN
  Administered 2022-06-24: 140 mg via INTRAVENOUS

## 2022-06-24 MED ORDER — CEFAZOLIN SODIUM-DEXTROSE 2-4 GM/100ML-% IV SOLN
INTRAVENOUS | Status: AC
  Filled 2022-06-24: qty 100

## 2022-06-24 MED ORDER — OXYCODONE-ACETAMINOPHEN 5-325 MG PO TABS
1.0000 | ORAL_TABLET | Freq: Four times a day (QID) | ORAL | Status: DC | PRN
  Administered 2022-06-24 – 2022-06-27 (×4): 2 via ORAL
  Filled 2022-06-24 (×4): qty 2

## 2022-06-24 MED ORDER — ACETAMINOPHEN 10 MG/ML IV SOLN: 1000.0000 mg | Freq: Once | INTRAVENOUS | Status: DC | PRN

## 2022-06-24 MED ORDER — PHENYLEPHRINE 80 MCG/ML (10ML) SYRINGE FOR IV PUSH (FOR BLOOD PRESSURE SUPPORT)
PREFILLED_SYRINGE | INTRAVENOUS | Status: DC | PRN
  Administered 2022-06-24: 80 ug via INTRAVENOUS

## 2022-06-24 MED ORDER — FENTANYL CITRATE PF 50 MCG/ML IJ SOSY: 12.5000 ug | PREFILLED_SYRINGE | Freq: Once | INTRAMUSCULAR | Status: DC | PRN

## 2022-06-24 MED ORDER — FENTANYL CITRATE PF 50 MCG/ML IJ SOSY
50.0000 ug | PREFILLED_SYRINGE | Freq: Once | INTRAMUSCULAR | Status: AC
  Administered 2022-06-24: 50 ug via INTRAVENOUS

## 2022-06-24 MED ORDER — 0.9 % SODIUM CHLORIDE (POUR BTL) OPTIME
TOPICAL | Status: DC | PRN
  Administered 2022-06-24: 1000 mL

## 2022-06-24 MED ORDER — ADULT MULTIVITAMIN W/MINERALS CH
1.0000 | ORAL_TABLET | Freq: Every day | ORAL | Status: DC
  Administered 2022-06-25 – 2022-06-28 (×4): 1 via ORAL
  Filled 2022-06-24 (×4): qty 1

## 2022-06-24 MED ORDER — CHLORHEXIDINE GLUCONATE 4 % EX SOLN
60.0000 mL | Freq: Once | CUTANEOUS | Status: AC
  Administered 2022-06-24: 4 via TOPICAL

## 2022-06-24 MED ORDER — LACTATED RINGERS IV SOLN: INTRAVENOUS | Status: DC | PRN

## 2022-06-24 MED ORDER — OXYCODONE HCL 5 MG/5ML PO SOLN: 5.0000 mg | Freq: Once | ORAL | Status: DC | PRN

## 2022-06-24 MED ORDER — FENTANYL CITRATE (PF) 100 MCG/2ML IJ SOLN
INTRAMUSCULAR | Status: DC | PRN
  Administered 2022-06-24 (×2): 50 ug via INTRAVENOUS

## 2022-06-24 MED ORDER — FENTANYL CITRATE PF 50 MCG/ML IJ SOSY
PREFILLED_SYRINGE | INTRAMUSCULAR | Status: AC
  Filled 2022-06-24: qty 1

## 2022-06-24 MED ORDER — OXYCODONE HCL 5 MG PO TABS: 5.0000 mg | ORAL_TABLET | Freq: Once | ORAL | Status: DC | PRN

## 2022-06-24 MED ORDER — JUVEN PO PACK
1.0000 | PACK | Freq: Two times a day (BID) | ORAL | Status: DC
  Administered 2022-06-25 – 2022-06-29 (×6): 1 via ORAL
  Filled 2022-06-24: qty 1

## 2022-06-24 MED ORDER — ONDANSETRON HCL 4 MG/2ML IJ SOLN: 4.0000 mg | Freq: Four times a day (QID) | INTRAMUSCULAR | Status: DC | PRN

## 2022-06-24 MED ORDER — MIDAZOLAM HCL 2 MG/2ML IJ SOLN
INTRAMUSCULAR | Status: AC
  Filled 2022-06-24: qty 2

## 2022-06-24 MED ORDER — PHENYLEPHRINE HCL (PRESSORS) 10 MG/ML IV SOLN
INTRAVENOUS | Status: DC | PRN
  Administered 2022-06-24 (×2): 80 ug via INTRAVENOUS

## 2022-06-24 MED ORDER — PROPOFOL 10 MG/ML IV BOLUS
INTRAVENOUS | Status: DC | PRN
  Administered 2022-06-24: 100 mg via INTRAVENOUS

## 2022-06-24 MED ORDER — SODIUM CHLORIDE 0.9% IV SOLUTION: Freq: Once | INTRAVENOUS | Status: AC

## 2022-06-24 MED ORDER — HYDROMORPHONE HCL 1 MG/ML IJ SOLN: 1.0000 mg | Freq: Once | INTRAMUSCULAR | Status: DC | PRN

## 2022-06-24 MED ORDER — ONDANSETRON HCL 4 MG/2ML IJ SOLN: 4.0000 mg | Freq: Once | INTRAMUSCULAR | Status: DC | PRN

## 2022-06-24 MED ORDER — PHENYLEPHRINE HCL-NACL 20-0.9 MG/250ML-% IV SOLN
INTRAVENOUS | Status: DC | PRN
  Administered 2022-06-24: 20 ug/min via INTRAVENOUS

## 2022-06-24 MED ORDER — MIDAZOLAM HCL 2 MG/2ML IJ SOLN
INTRAMUSCULAR | Status: DC | PRN
  Administered 2022-06-24 (×2): 1 mg via INTRAVENOUS

## 2022-06-24 MED ORDER — ONDANSETRON HCL 4 MG/2ML IJ SOLN
INTRAMUSCULAR | Status: DC | PRN
  Administered 2022-06-24: 4 mg via INTRAVENOUS

## 2022-06-24 MED ORDER — SUGAMMADEX SODIUM 200 MG/2ML IV SOLN
INTRAVENOUS | Status: DC | PRN
  Administered 2022-06-24: 200 mg via INTRAVENOUS

## 2022-06-24 MED ORDER — LACTATED RINGERS IV SOLN: INTRAVENOUS | Status: DC

## 2022-06-24 MED ORDER — ROCURONIUM BROMIDE 100 MG/10ML IV SOLN
INTRAVENOUS | Status: DC | PRN
  Administered 2022-06-24: 30 mg via INTRAVENOUS

## 2022-06-24 SURGICAL SUPPLY — 39 items
APL PRP STRL LF DISP 70% ISPRP (MISCELLANEOUS)
BNDG CMPR 5X6 CHSV STRCH STRL (GAUZE/BANDAGES/DRESSINGS) ×2
BNDG CMPR 75X21 PLY HI ABS (MISCELLANEOUS)
BNDG COHESIVE 6X5 TAN ST LF (GAUZE/BANDAGES/DRESSINGS) ×2 IMPLANT
CANISTER WOUND CARE 500ML ATS (WOUND CARE) IMPLANT
CHLORAPREP W/TINT 26 (MISCELLANEOUS) ×2 IMPLANT
COVER PROBE ULTRASOUND 5X96 (MISCELLANEOUS) IMPLANT
DRAPE EXTREMITY 106X87X128.5 (DRAPES) IMPLANT
DRAPE FOR WOUND VAC UNIT (DRAPES) IMPLANT
DRAPE INCISE IOBAN 66X45 STRL (DRAPES) IMPLANT
DRAPE TABLE BACK 80X90 (DRAPES) IMPLANT
DRSG EMULSION OIL 3X3 NADH (GAUZE/BANDAGES/DRESSINGS) IMPLANT
DRSG MEPILEX FLEX 6X6 (GAUZE/BANDAGES/DRESSINGS) IMPLANT
DRSG VAC GRANUFOAM LG (GAUZE/BANDAGES/DRESSINGS) IMPLANT
DRSG VAC GRANUFOAM MED (GAUZE/BANDAGES/DRESSINGS) IMPLANT
ELECT REM PT RETURN 9FT ADLT (ELECTROSURGICAL) ×2
ELECTRODE REM PT RTRN 9FT ADLT (ELECTROSURGICAL) ×2 IMPLANT
GAUZE SPONGE 4X4 12PLY STRL (GAUZE/BANDAGES/DRESSINGS) IMPLANT
GAUZE STRETCH 2X75IN STRL (MISCELLANEOUS) IMPLANT
GLOVE BIO SURGEON STRL SZ7 (GLOVE) ×2 IMPLANT
GLOVE SURG SYN 8.0 (GLOVE) ×2 IMPLANT
GLOVE SURG SYN 8.0 PF PI (GLOVE) ×2 IMPLANT
GOWN STRL REUS W/ TWL LRG LVL3 (GOWN DISPOSABLE) ×4 IMPLANT
GOWN STRL REUS W/ TWL XL LVL3 (GOWN DISPOSABLE) ×2 IMPLANT
GOWN STRL REUS W/TWL LRG LVL3 (GOWN DISPOSABLE) ×4
GOWN STRL REUS W/TWL XL LVL3 (GOWN DISPOSABLE) ×2
KIT CV MULTILUMEN 7FR 20 (SET/KITS/TRAYS/PACK) ×2
KIT CV MULTILUMEN 7FR 20 SUB (SET/KITS/TRAYS/PACK) IMPLANT
KIT TURNOVER KIT A (KITS) ×2 IMPLANT
LABEL OR SOLS (LABEL) ×2 IMPLANT
MANIFOLD NEPTUNE II (INSTRUMENTS) ×2 IMPLANT
NS IRRIG 500ML POUR BTL (IV SOLUTION) ×2 IMPLANT
PACK EXTREMITY ARMC (MISCELLANEOUS) ×2 IMPLANT
PAD PREP OB/GYN DISP 24X41 (PERSONAL CARE ITEMS) ×2 IMPLANT
SOL PREP PVP 2OZ (MISCELLANEOUS) ×4
SOLUTION PREP PVP 2OZ (MISCELLANEOUS) ×2 IMPLANT
STOCKINETTE IMPERV 14X48 (MISCELLANEOUS) ×2 IMPLANT
TRAP FLUID SMOKE EVACUATOR (MISCELLANEOUS) ×2 IMPLANT
WATER STERILE IRR 500ML POUR (IV SOLUTION) ×2 IMPLANT

## 2022-06-24 NOTE — Progress Notes (Signed)
Initial Nutrition Assessment  DOCUMENTATION CODES:   Obesity unspecified  INTERVENTION:   -Once diet is advanced:   -1 packet Juven BID, each packet provides 95 calories, 2.5 grams of protein (collagen), and 9.8 grams of carbohydrate (3 grams sugar); also contains 7 grams of L-arginine and L-glutamine, 300 mg vitamin C, 15 mg vitamin E, 1.2 mcg vitamin B-12, 9.5 mg zinc, 200 mg calcium, and 1.5 g  Calcium Beta-hydroxy-Beta-methylbutyrate to support wound healing  -MVI with minerals daily  NUTRITION DIAGNOSIS:   Increased nutrient needs related to post-op healing as evidenced by estimated needs.  GOAL:   Patient will meet greater than or equal to 90% of their needs  MONITOR:   PO intake, Supplement acceptance, Diet advancement  REASON FOR ASSESSMENT:   Low Braden    ASSESSMENT:   Pt with a known history of A-fib on Eliquis, COPD, CHF, anemia is admitted for left lower extremity hematoma burst  Pt admitted with lt lower extremity hematoma burst.   Reviewed I/O's: -497 ml x 24 hours   Pt NPO for procedure with vascular surgery today.   Spoke with pt at bedside, who is very eager for surgery today. He reports having a good appetite PTA. He continues to fixate on a "lollipop" that he was given earlier to swab out his mouth.   Reviewed wt hx; no wt loss noted over the past 8 months. Noted pt with moderate edema in moderate extremities, which may be contributing to weight gain.   Per chart review, pt is from [facility].   Discussed importance of good meal and supplement intake to promote healing.   Medications reviewed and include 0.9% sodium chloride infusion @ 75 ml/hr.   Labs reviewed: Na: 132.    NUTRITION - FOCUSED PHYSICAL EXAM:  Flowsheet Row Most Recent Value  Orbital Region Moderate depletion  Upper Arm Region No depletion  Thoracic and Lumbar Region No depletion  Buccal Region No depletion  Temple Region No depletion  Clavicle Bone Region No  depletion  Clavicle and Acromion Bone Region No depletion  Scapular Bone Region No depletion  Dorsal Hand No depletion  Patellar Region No depletion  Anterior Thigh Region No depletion  Posterior Calf Region No depletion  Edema (RD Assessment) Moderate  Hair Reviewed  Eyes Reviewed  Mouth Reviewed  Skin Reviewed  Nails Reviewed       Diet Order:   Diet Order             Diet NPO time specified Except for: Sips with Meds  Diet effective midnight                   EDUCATION NEEDS:   Education needs have been addressed  Skin:  Skin Assessment: Reviewed RN Assessment  Last BM:  06/23/22  Height:   Ht Readings from Last 1 Encounters:  06/24/22 5\' 11"  (1.803 m)    Weight:   Wt Readings from Last 1 Encounters:  06/24/22 113.3 kg    Ideal Body Weight:  78.2 kg  BMI:  Body mass index is 34.84 kg/m.  Estimated Nutritional Needs:   Kcal:  2150-2350  Protein:  105-120 grams  Fluid:  > 2 L    Levada Schilling, RD, LDN, CDCES Registered Dietitian II Certified Diabetes Care and Education Specialist Please refer to [facility] for RD and/or RD on-call/weekend/after hours pager

## 2022-06-24 NOTE — Anesthesia Preprocedure Evaluation (Addendum)
Anesthesia Evaluation  Patient identified by MRN, date of birth, ID band Patient awake    Reviewed: Allergy & Precautions, NPO status , Patient's Chart, lab work & pertinent test results  History of Anesthesia Complications (+) DIFFICULT AIRWAY and history of anesthetic complications (Intubation 02/02/15: easy mask, 7.5 ETT placed 1st attempt, grade 1 view with Glidescope 4)  Airway Mallampati: IV   Neck ROM: Full    Dental  (+) Poor Dentition   Pulmonary sleep apnea , COPD, former smoker (quit 1999)   Pulmonary exam normal breath sounds clear to auscultation       Cardiovascular hypertension, +CHF  Normal cardiovascular exam+ dysrhythmias (a fib on Eliquis)  Rhythm:Regular Rate:Normal  ECG 06/22/22:  Atrial fibrillation Nonspecific intraventricular conduction delay Borderline T abnormalities, anterior leads  Echo 03/09/18:  NORMAL LEFT VENTRICULAR SYSTOLIC FUNCTION  NORMAL RIGHT VENTRICULAR SYSTOLIC FUNCTION  MODERATE VALVULAR REGURGITATION  NO VALVULAR STENOSIS  Moderately elevated pulmonary pressures, RVSP 50+mmHg    Neuro/Psych Hx VP shunt CVA (residual left-sided weakness)    GI/Hepatic negative GI ROS,,,  Endo/Other  Hypothyroidism  Obesity   Renal/GU Renal disease (AKI on stage III CKD)     Musculoskeletal   Abdominal   Peds  Hematology  (+) Blood dyscrasia, anemia   Anesthesia Other Findings   Reproductive/Obstetrics                             Anesthesia Physical Anesthesia Plan  ASA: 4 and emergent  Anesthesia Plan: General   Post-op Pain Management:    Induction: Intravenous  PONV Risk Score and Plan: 2 and Ondansetron, Dexamethasone and Treatment may vary due to age or medical condition  Airway Management Planned: Oral ETT  Additional Equipment: Arterial line  Intra-op Plan:   Post-operative Plan: Extubation in OR  Informed Consent: I have reviewed the  patients History and Physical, chart, labs and discussed the procedure including the risks, benefits and alternatives for the proposed anesthesia with the patient or authorized representative who has indicated his/her understanding and acceptance.   Patient has DNR.  Discussed DNR with patient and Suspend DNR.   Dental advisory given  Plan Discussed with: CRNA  Anesthesia Plan Comments: (Patient consented for risks of anesthesia including but not limited to:  - adverse reactions to medications - damage to eyes, teeth, lips or other oral mucosa - nerve damage due to positioning  - sore throat or hoarseness - damage to heart, brain, nerves, lungs, other parts of body or loss of life  Informed patient about role of CRNA in peri- and intra-operative care.  Patient voiced understanding.  Note: See intra-op quick note; planned to do regional anesthesia with minimal IV anesthesia intra-op due to patient's significant comorbidities.  Skin not suitable for block placement, however, so aborted procedure. Plan for GETA.)        Anesthesia Quick Evaluation

## 2022-06-24 NOTE — Transfer of Care (Signed)
Immediate Anesthesia Transfer of Care Note  Patient: Daniel Eaton.  Procedure(s) Performed: EVACUATION HEMATOMA (Left: Leg Lower) INCISION AND DRAINAGE (Left) APPLICATION OF WOUND VAC (Left: Leg Lower)  Patient Location: PACU  Anesthesia Type:General  Level of Consciousness: drowsy  Airway & Oxygen Therapy: Patient Spontanous Breathing and Patient connected to face mask oxygen  Post-op Assessment: Report given to RN  Post vital signs: stable  Last Vitals:  Vitals Value Taken Time  BP 129/68 06/24/22 1551  Temp    Pulse 87 06/24/22 1558  Resp 12 06/24/22 1558  SpO2 100 % 06/24/22 1558  Vitals shown include unvalidated device data.  Last Pain:  Vitals:   06/24/22 1324  TempSrc: Temporal  PainSc:       Patients Stated Pain Goal: 2 (06/23/22 2245)  Complications: No notable events documented.

## 2022-06-24 NOTE — OR Nursing (Signed)
1545 skin tear on right forearm Mepilex border flex applied.

## 2022-06-24 NOTE — Anesthesia Procedure Notes (Signed)
Arterial Line Insertion Start/End5/11/2022 2:00 PM, 06/24/2022 2:03 PM Performed by: Reed Breech, MD, anesthesiologist  Patient location: Pre-op. Preanesthetic checklist: patient identified, IV checked, site marked, risks and benefits discussed, surgical consent, monitors and equipment checked, pre-op evaluation, timeout performed and anesthesia consent Patient sedated Left, radial was placed Catheter size: 20 G Hand hygiene performed  and maximum sterile barriers used   Attempts: 1 Procedure performed using ultrasound guided technique. Ultrasound Notes:anatomy identified, needle tip was noted to be adjacent to the nerve/plexus identified and no ultrasound evidence of intravascular and/or intraneural injection Following insertion, dressing applied and Biopatch. Post procedure assessment: normal and unchanged  Patient tolerated the procedure well with no immediate complications.

## 2022-06-24 NOTE — Anesthesia Procedure Notes (Signed)
Procedure Name: Intubation Date/Time: 06/24/2022 2:00 PM  Performed by: Cheral Bay, CRNAPre-anesthesia Checklist: Patient identified, Emergency Drugs available, Suction available and Patient being monitored Patient Re-evaluated:Patient Re-evaluated prior to induction Oxygen Delivery Method: Circle system utilized Preoxygenation: Pre-oxygenation with 100% oxygen Induction Type: IV induction and Rapid sequence Laryngoscope Size: McGraph and 4 Grade View: Grade I Tube type: Oral Tube size: 7.0 mm Number of attempts: 1 Airway Equipment and Method: Stylet and Oral airway Placement Confirmation: ETT inserted through vocal cords under direct vision, positive ETCO2 and breath sounds checked- equal and bilateral Secured at: 20 cm Tube secured with: Tape Dental Injury: Teeth and Oropharynx as per pre-operative assessment

## 2022-06-24 NOTE — TOC Initial Note (Signed)
Transition of Care (TOC) - Initial/Assessment Note    Patient Details  Name: Daniel Eaton. MRN: [id number] Date of Birth: February 16, 1940  Transition of Care [facility]) CM/SW Contact:    Liliana Cline, LCSW Phone Number: 06/24/2022, 9:17 AM  Clinical Narrative:                 Spoke to Gavin Pound with [facility] who states patient is a long term care resident at [facility].      Expected Discharge Plan: Skilled Nursing Facility Barriers to Discharge: Continued Medical Work up   Patient Goals and CMS Choice            Expected Discharge Plan and Services       Living arrangements for the past 2 months: Skilled Nursing Facility                                      Prior Living Arrangements/Services Living arrangements for the past 2 months: Skilled Nursing Facility Lives with:: Facility Resident                   Activities of Daily Living Home Assistive Devices/Equipment: Nurse, adult, Wheelchair, Hospital bed (at [facility]) ADL Screening (condition at time of admission) Patient's cognitive ability adequate to safely complete daily activities?: Yes Is the patient deaf or have difficulty hearing?: Yes Does the patient have difficulty seeing, even when wearing glasses/contacts?: No Does the patient have difficulty concentrating, remembering, or making decisions?: Yes Patient able to express need for assistance with ADLs?: Yes Does the patient have difficulty dressing or bathing?: Yes Independently performs ADLs?: No Communication: Independent Dressing (OT): Dependent Is this a change from baseline?: Pre-admission baseline Grooming: Needs assistance Is this a change from baseline?: Pre-admission baseline Feeding: Independent Bathing: Dependent Is this a change from baseline?: Pre-admission baseline Toileting: Dependent Is this a change from baseline?: Pre-admission baseline In/Out Bed: Dependent Is this a change from baseline?: Pre-admission  baseline Walks in Home: Dependent Is this a change from baseline?: Pre-admission baseline Does the patient have difficulty walking or climbing stairs?: Yes Weakness of Legs: Both Weakness of Arms/Hands: Both  Permission Sought/Granted                  Emotional Assessment              Admission diagnosis:  Hematoma [T14.8XXA] AKI (acute kidney injury) (HCC) [N17.9] Leg hematoma, left, initial encounter [S80.12XA] Anemia, unspecified type [D64.9] Patient Active Problem List   Diagnosis Date Noted   Leg hematoma, left, initial encounter 06/22/2022   Lactic acidosis 06/22/2022   Macrocytic anemia 06/06/2022   Cellulitis of left lower extremity 02/14/2021   COPD (chronic obstructive pulmonary disease) (HCC)    Stroke (HCC)    Acute renal failure superimposed on stage 3a chronic kidney disease (HCC)    Elevated troponin    Normocytic anemia    Generalized weakness    Acute renal failure superimposed on chronic kidney disease (HCC)    Myalgia due to statin 12/18/2019   Atrial fibrillation (HCC) 04/09/2019   Essential hypertension 04/09/2019   Venous stasis dermatitis of both lower extremities 04/09/2019   Lower limb ulcer, ankle, right, limited to breakdown of skin (HCC) 04/09/2019   Rhabdomyolysis 12/10/2018   Impairment of balance 10/02/2018   Physical deconditioning 10/02/2018   Sepsis (HCC) 07/28/2018   Primary osteoarthritis of right knee 09/22/2017  Prediabetes 07/14/2017   Healthcare maintenance 03/16/2017   Aortic atherosclerosis (HCC) 03/15/2016   Cough 03/20/2015   Bronchitis, chronic obstructive w acute bronchitis (HCC) 03/20/2015   Umbilical hernia without obstruction and without gangrene 01/12/2015   Ventral hernia without obstruction or gangrene 01/02/2015   Morbid obesity with BMI of 45.0-49.9, adult (HCC) 11/23/2013   Achalasia 06/30/2013   Cardiomyopathy (HCC) 06/30/2013   OSA (obstructive sleep apnea) 06/30/2013   Hypothyroid 06/30/2013    PCP:  Lauro Regulus, MD Pharmacy:   [phone] - [place], [place] - [address] [place] [address] Phone: [phone] Fax: [phone]  [facility] - [place], [place] - [address] [place] [address] Phone: [phone] Fax: [phone]     Social Determinants of Health (SDOH) Social History: SDOH Screenings   Food Insecurity: No Food Insecurity (06/23/2022)  Housing: Low Risk  (06/23/2022)  Transportation Needs: No Transportation Needs (06/23/2022)  Utilities: Not At Risk (06/23/2022)  Depression (PHQ2-9): Low Risk  (06/06/2022)  Tobacco Use: Medium Risk (06/23/2022)   SDOH Interventions:     Readmission Risk Interventions     No data to display

## 2022-06-24 NOTE — Progress Notes (Signed)
Patient to OR with blood still running.

## 2022-06-24 NOTE — Op Note (Signed)
OPERATIVE NOTE   PROCEDURE: 1.  Drainage of hematoma with excisional debridement of necrotic skin and necrotic subcutaneous tissues left lower extremity. 2.  Application of a wound VAC. 3.  Insertion of a right IJ triple-lumen catheter with ultrasound guidance  PRE-OPERATIVE DIAGNOSIS: Infected hematoma with necrotic skin and soft tissue left lower extremity.  POST-OPERATIVE DIAGNOSIS: Same  SURGEON: Levora Dredge  ASSISTANT(S): Rolla Plate, NP  ANESTHESIA: general  ESTIMATED BLOOD LOSS: 50 cc  FINDING(S): Hematoma associated with necrotic skin and necrotic soft tissues.  SPECIMEN(S): The debrided tissue and clot is not sent for specimen.  Deep wound culture is obtained and this is sent to microbiology  INDICATIONS:   Abimelec Diprima. is a 82 y.o. male who presents with a massive hematoma of the left lower extremity associated with necrosis of the skin and subcutaneous air by CT consistent with abscess.  Risks and benefits for incision and drainage with excisional debridement of devitalized tissue was reviewed with the patient all questions answered patient agrees to proceed.  Additionally anesthesia does not have adequate IV access and therefore triple-lumen catheter is being placed before the lower extremity procedure begins..  DESCRIPTION: After full informed written consent was obtained from the patient, the patient was brought back to the operating room and placed supine upon the operating table.    The right neck is prepped and draped in a sterile fashion.  Ultrasound is placed into a sterile sleeve.  The right internal jugular vein is evaluated with the ultrasound.  It is noted to be echolucent and compressible indicating patency.  Image was recorded for the permanent record.  Under direct ultrasound visualization a Seldinger needle was inserted directly into the jugular vein J-wire was then advanced followed by the dilator followed by the triple-lumen catheter.  All 3  lm aspirated and flushed easily.  Catheter secured to the skin of the neck with a 2-0 silk Biopatch and sterile dressing is applied.  We then moved forward with the lower extremity portion of the case.  Prior to induction, the patient received IV antibiotics.   After obtaining adequate anesthesia, the patient was then prepped and draped in the standard fashion for a evacuation of hematoma with excisional debridement of devitalized tissue.  A elliptical incision is then made around the necrotic skin in the lower wound.  Skin and subcutaneous fat is debrided down to fascia.  Large amount of hematoma is in countered with some tunneling.  This is all removed by digital manipulation.  Devitalized fat and necrotic skin is excised with curved Mayo scissors as well as a 15 blade scalpel.  Attention is then turned to the area at the level of the femoral condyle and again a tear dropped incision is made to resect a large area of nonviable skin.  Large amount of hematoma is again encountered and this is removed with digital manipulation as well as irrigation.  The wound is then debrided with Mayo scissors and Bovie cautery as well as a 15 blade scalpel.  All devitalized subcutaneous tissues are excised.  The fascia is exposed in both incisions but this appears uninvolved.  Swab of the upper wound is then obtained for microbiology.  The wounds were then measured and the upper wound measures 18 x 10 cm for a total of 180 cm.  The lower wound measures 14 x 7 cm for a total of 98 cm.  Once hemostasis has been achieved with Bovie cautery the wounds are irrigated with a  liter of saline.  The VAC black sponge is then trimmed to fit both the upper and the lower with a bridging piece.  The dressing is then secured and hooked to the machine with an excellent seal.  Machine is set to 125 mmHg continuous.     The patient tolerated this procedure well.   COMPLICATIONS: None  CONDITION: Velna Hatchet [facility]  Office: [phone]   06/24/2022, 3:55 PM

## 2022-06-24 NOTE — Care Management Important Message (Signed)
Important Message  Patient Details  Name: Daniel Eaton. MRN: [id number] Date of Birth: 06/22/1940   Medicare Important Message Given:  Yes     Olegario Messier A Annaliz Aven 06/24/2022, 2:47 PM

## 2022-06-25 ENCOUNTER — Encounter: Payer: Self-pay | Admitting: Vascular Surgery

## 2022-06-25 DIAGNOSIS — S8012XA Contusion of left lower leg, initial encounter: Secondary | ICD-10-CM | POA: Diagnosis not present

## 2022-06-25 DIAGNOSIS — J449 Chronic obstructive pulmonary disease, unspecified: Secondary | ICD-10-CM | POA: Diagnosis not present

## 2022-06-25 DIAGNOSIS — I4891 Unspecified atrial fibrillation: Secondary | ICD-10-CM | POA: Diagnosis not present

## 2022-06-25 DIAGNOSIS — N179 Acute kidney failure, unspecified: Secondary | ICD-10-CM | POA: Diagnosis not present

## 2022-06-25 LAB — CBC
HCT: 25.2 % — ABNORMAL LOW (ref 39.0–52.0)
Hemoglobin: 8.2 g/dL — ABNORMAL LOW (ref 13.0–17.0)
MCH: 31.4 pg (ref 26.0–34.0)
MCHC: 32.5 g/dL (ref 30.0–36.0)
MCV: 96.6 fL (ref 80.0–100.0)
Platelets: 227 10*3/uL (ref 150–400)
RBC: 2.61 MIL/uL — ABNORMAL LOW (ref 4.22–5.81)
RDW: 19.6 % — ABNORMAL HIGH (ref 11.5–15.5)
WBC: 12.2 10*3/uL — ABNORMAL HIGH (ref 4.0–10.5)
nRBC: 3.4 % — ABNORMAL HIGH (ref 0.0–0.2)

## 2022-06-25 LAB — BASIC METABOLIC PANEL
Anion gap: 8 (ref 5–15)
BUN: 38 mg/dL — ABNORMAL HIGH (ref 8–23)
CO2: 23 mmol/L (ref 22–32)
Calcium: 7.9 mg/dL — ABNORMAL LOW (ref 8.9–10.3)
Chloride: 100 mmol/L (ref 98–111)
Creatinine, Ser: 1.49 mg/dL — ABNORMAL HIGH (ref 0.61–1.24)
GFR, Estimated: 47 mL/min — ABNORMAL LOW (ref >60)
Glucose, Bld: 153 mg/dL — ABNORMAL HIGH (ref 70–99)
Potassium: 3.8 mmol/L (ref 3.5–5.1)
Sodium: 131 mmol/L — ABNORMAL LOW (ref 135–145)

## 2022-06-25 MED ORDER — CHLORHEXIDINE GLUCONATE CLOTH 2 % EX PADS
6.0000 | MEDICATED_PAD | Freq: Every day | CUTANEOUS | Status: DC
  Administered 2022-06-25 – 2022-06-29 (×5): 6 via TOPICAL

## 2022-06-25 MED ORDER — CEPHALEXIN 250 MG PO CAPS
250.0000 mg | ORAL_CAPSULE | Freq: Three times a day (TID) | ORAL | Status: DC
  Administered 2022-06-25 – 2022-06-28 (×9): 250 mg via ORAL
  Filled 2022-06-25 (×10): qty 1

## 2022-06-25 NOTE — Assessment & Plan Note (Signed)
Hold metoprolol.  Blood pressure is controlled for now   

## 2022-06-25 NOTE — Assessment & Plan Note (Signed)
Cont levothyroxine.   

## 2022-06-25 NOTE — Assessment & Plan Note (Signed)
Cpap per home setting for his OSA. 

## 2022-06-25 NOTE — Assessment & Plan Note (Signed)
We will get an A1c.

## 2022-06-25 NOTE — Assessment & Plan Note (Signed)
Stable.  On room air 

## 2022-06-25 NOTE — Assessment & Plan Note (Signed)
Resolved with hydration.  Likely due to hypotension.  

## 2022-06-25 NOTE — Progress Notes (Signed)
  Progress Note   Patient: Daniel Eaton. ZOX:[id number] DOB: [birth year] DOA: 06/22/2022     3 DOS: the patient was seen and examined on 06/25/2022   Brief hospital course: 82 year old male with a known history of A-fib on Eliquis, COPD, CHF, anemia is admitted for left lower extremity hematoma burst  5/9: Vascular surgery consult.  Plan for hematoma evacuation in the OR tomorrow 5/10: Drainage of hematoma, right IJ triple-lumen catheter and application of wound VAC    Assessment and Plan: * Leg hematoma, left, initial encounter Hold Eliquis.  - hematoma drainage, application of wound VAC and insertion of right IJ triple-lumen catheter by vascular surgery  Lactic acidosis Resolved with hydration.  Likely due to hypotension  Hypothyroid Cont levothyroxine  OSA (obstructive sleep apnea) Cpap per home setting for his OSA.  Acute renal failure superimposed on stage 3a chronic kidney disease (HCC) Creatinine 2.12 Also has hyperkalemia -s/p Lokelma 1 dose and potassium normalized Holding diuretics due to worsening of kidney function.  Give IV fluids and monitor kidney function. nephrology consult pending  COPD (chronic obstructive pulmonary disease) (HCC) Stable.  On room air  Essential hypertension Hold metoprolol.  Blood pressure is controlled for now  Atrial fibrillation (HCC) Rate is controlled for now.  Unable to give Eliquis due to large hematoma and anemia         Subjective: Was having significant pain  Physical Exam: Vitals:   06/24/22 2332 06/25/22 0439 06/25/22 0500 06/25/22 0805  BP: 121/63 115/64  103/61  Pulse: 77 77  75  Resp: 18 18    Temp: 97.9 F (36.6 C) 97.9 F (36.6 C)  97.8 F (36.6 C)  TempSrc:      SpO2: 96% 98%  97%  Weight:   123 kg   Height:       82 year old frail looking male lying in the bed comfortably without any acute distress Lungs clear to auscultation bilaterally Heart irregularly irregular heart sounds Abdomen soft,  benign, obese Extremities/skin: Large hematoma extending from the distal calf up to the mid thigh with significant swelling and induration.  He does have open wound on the left lateral mid calf with some clotted blood but no active bleeding. Neuro awake and alert, nonfocal Psych normal mood and affect Data Reviewed:  Creatinine 2.12, hemoglobin 6.3  Family Communication: None at bedside  Disposition: Status is: Inpatient Remains inpatient appropriate because: Going for hematoma evacuation in the OR  Planned Discharge Destination: Rehab   DVT prophylaxis-SCDs Time spent: 35 minutes  Author: Delfino Lovett, MD 06/25/2022 1:57 PM  For on call review [email or web address].

## 2022-06-25 NOTE — Assessment & Plan Note (Addendum)
Hold Eliquis.  Status post drainage of hematoma with excisional debridement of necrotic skin/subcutaneous tissue of the left lower extremity, application of wound VAC, insertion of right IJ triple-lumen catheter by vascular surgery on 5/10.  Wound VAC change plan for next Tuesday  Will request removal of central line as there is no further need at this time.  Will also request removal of Foley in place external catheter

## 2022-06-25 NOTE — Progress Notes (Signed)
  Progress Note   Patient: Daniel Eaton. ZOX:[id number] DOB: November 27, 1940 DOA: 06/22/2022     3 DOS: the patient was seen and examined on 06/25/2022   Brief hospital course: 82 year old male with a known history of A-fib on Eliquis, COPD, CHF, anemia is admitted for left lower extremity hematoma burst  5/9: Vascular surgery consult.  Plan for hematoma evacuation in the OR tomorrow 5/10: Drainage of hematoma, right IJ triple-lumen catheter and application of wound VAC    Assessment and Plan: * Leg hematoma, left, initial encounter Hold Eliquis.  Status post drainage of hematoma with excisional debridement of necrotic skin/subcutaneous tissue of the left lower extremity, application of wound VAC, insertion of right IJ triple-lumen catheter by vascular surgery on 5/10.  Wound VAC change plan for next Tuesday  Will request removal of central line as there is no further need at this time.  Will also request removal of Foley in place external catheter    Lactic acidosis Resolved with hydration.  Likely due to hypotension.   Hypothyroid Cont levothyroxine.  OSA (obstructive sleep apnea) Cpap per home setting for his OSA  Acute renal failure superimposed on stage 3a chronic kidney disease (HCC) Lab Results  Component Value Date   CREATININE 2.12 (H) 06/24/2022   CREATININE 2.34 (H) 06/23/2022   CREATININE 2.32 (H) 06/23/2022  Also has hyperkalemia -will order 10 g of Lokelma Holding diuretics due to worsening of kidney function.  Give IV fluids and monitor kidney function. nephrology consult pending    COPD (chronic obstructive pulmonary disease) (HCC) Stable.  On room air.  Essential hypertension Hold metoprolol.  Blood pressure is controlled for now.    Atrial fibrillation (HCC) Rate is controlled for now.  Unable to give Eliquis due to large hematoma and anemia.         Subjective: Requesting to get neck/central line removed as he is not able to function properly.   He does a peripheral line in his right forearm.  He is also requesting to get Foley removed in place external catheter.  No new issues  Physical Exam: Vitals:   06/24/22 2332 06/25/22 0439 06/25/22 0500 06/25/22 0805  BP: 121/63 115/64  103/61  Pulse: 77 77  75  Resp: 18 18    Temp: 97.9 F (36.6 C) 97.9 F (36.6 C)  97.8 F (36.6 C)  TempSrc:      SpO2: 96% 98%  97%  Weight:   123 kg   Height:       82 year old frail looking male lying in the bed comfortably without any acute distress Lungs clear to auscultation bilaterally Heart irregularly irregular heart sounds Abdomen soft, benign, obese Extremities/skin: Large hematoma extending from the distal calf up to the mid thigh with significant swelling and induration.  He does have open wound on the left lateral mid calf with some clotted blood but no active bleeding. Neuro awake and alert, nonfocal Psych normal mood and affect Data Reviewed:  There are no new results to review at this time.  Family Communication: None at bedside  Disposition: Status is: Inpatient Remains inpatient appropriate because: Needs wound VAC change on coming Tuesday.  Management of hematoma  Planned Discharge Destination: Rehab   DVT prophylaxis-SCDs Time spent: 35 minutes  Author: Delfino Lovett, MD 06/25/2022 2:09 PM  For on call review [email or web address].

## 2022-06-25 NOTE — Assessment & Plan Note (Signed)
Rate is controlled for now.  Unable to give Eliquis due to large hematoma and anemia.  

## 2022-06-25 NOTE — Plan of Care (Signed)

## 2022-06-25 NOTE — Anesthesia Postprocedure Evaluation (Signed)
Anesthesia Post Note  Patient: Daniel Eaton.  Procedure(s) Performed: EVACUATION HEMATOMA (Left: Leg Lower) INCISION AND DRAINAGE (Left) APPLICATION OF WOUND VAC (Left: Leg Lower)  Patient location during evaluation: PACU Anesthesia Type: General Level of consciousness: awake and alert Pain management: pain level controlled Vital Signs Assessment: post-procedure vital signs reviewed and stable Respiratory status: spontaneous breathing, nonlabored ventilation, respiratory function stable and patient connected to nasal cannula oxygen Cardiovascular status: blood pressure returned to baseline and stable Postop Assessment: no apparent nausea or vomiting Anesthetic complications: no Comments: Initially there was some disparity in his BP between the cuff and his arterial line.  Did consider keeping the arterial line and sending him to the unit, but upon discussion with ICU physician we are in agreement that he is stable and can go to the floor.  Arterial line removed in PACU.   No notable events documented.   Last Vitals:  Vitals:   06/24/22 1823 06/24/22 2332  BP: (!) 94/56 121/63  Pulse: 72 77  Resp: 17 18  Temp: 36.9 C 36.6 C  SpO2: 90% 96%    Last Pain:  Vitals:   06/24/22 2156  TempSrc:   PainSc: Asleep                 Lenard Simmer

## 2022-06-26 DIAGNOSIS — I10 Essential (primary) hypertension: Secondary | ICD-10-CM | POA: Diagnosis not present

## 2022-06-26 DIAGNOSIS — N179 Acute kidney failure, unspecified: Secondary | ICD-10-CM | POA: Diagnosis not present

## 2022-06-26 DIAGNOSIS — J449 Chronic obstructive pulmonary disease, unspecified: Secondary | ICD-10-CM | POA: Diagnosis not present

## 2022-06-26 DIAGNOSIS — S8012XA Contusion of left lower leg, initial encounter: Secondary | ICD-10-CM | POA: Diagnosis not present

## 2022-06-26 LAB — TYPE AND SCREEN
Antibody Screen: NEGATIVE
Unit division: 0
Unit division: 0

## 2022-06-26 LAB — CBC
HCT: 24.2 % — ABNORMAL LOW (ref 39.0–52.0)
Hemoglobin: 7.5 g/dL — ABNORMAL LOW (ref 13.0–17.0)
MCH: 30.6 pg (ref 26.0–34.0)
MCHC: 31 g/dL (ref 30.0–36.0)
MCV: 98.8 fL (ref 80.0–100.0)
Platelets: 218 10*3/uL (ref 150–400)
RBC: 2.45 MIL/uL — ABNORMAL LOW (ref 4.22–5.81)
RDW: 19.3 % — ABNORMAL HIGH (ref 11.5–15.5)
WBC: 12.1 10*3/uL — ABNORMAL HIGH (ref 4.0–10.5)
nRBC: 2.2 % — ABNORMAL HIGH (ref 0.0–0.2)

## 2022-06-26 LAB — BPAM RBC
Blood Product Expiration Date: 202406122359
ISSUE DATE / TIME: 202405101259
Unit Type and Rh: 6200
Unit Type and Rh: 6200

## 2022-06-26 LAB — BASIC METABOLIC PANEL
Anion gap: 7 (ref 5–15)
BUN: 39 mg/dL — ABNORMAL HIGH (ref 8–23)
CO2: 24 mmol/L (ref 22–32)
Calcium: 8 mg/dL — ABNORMAL LOW (ref 8.9–10.3)
Chloride: 102 mmol/L (ref 98–111)
Creatinine, Ser: 1.19 mg/dL (ref 0.61–1.24)
GFR, Estimated: >60 mL/min (ref >60)
Glucose, Bld: 103 mg/dL — ABNORMAL HIGH (ref 70–99)
Potassium: 3.8 mmol/L (ref 3.5–5.1)
Sodium: 133 mmol/L — ABNORMAL LOW (ref 135–145)

## 2022-06-26 NOTE — Assessment & Plan Note (Signed)
Stable.  On room air 

## 2022-06-26 NOTE — Assessment & Plan Note (Signed)
Hold metoprolol.  Blood pressure is controlled for now   

## 2022-06-26 NOTE — Assessment & Plan Note (Signed)
Cont levothyroxine.   

## 2022-06-26 NOTE — Assessment & Plan Note (Signed)
Resolved with hydration.  Likely due to hypotension.  

## 2022-06-26 NOTE — Plan of Care (Signed)
  Problem: Clinical Measurements: Goal: Ability to maintain clinical measurements within normal limits will improve Outcome: Progressing Goal: Will remain free from infection Outcome: Progressing Goal: Diagnostic test results will improve Outcome: Progressing Goal: Cardiovascular complication will be avoided Outcome: Progressing   Problem: Activity: Goal: Risk for activity intolerance will decrease Outcome: Progressing   Problem: Nutrition: Goal: Adequate nutrition will be maintained Outcome: Progressing   Problem: Pain Managment: Goal: General experience of comfort will improve Outcome: Progressing   Problem: Safety: Goal: Ability to remain free from injury will improve Outcome: Progressing   Problem: Skin Integrity: Goal: Risk for impaired skin integrity will decrease Outcome: Progressing

## 2022-06-26 NOTE — Plan of Care (Signed)

## 2022-06-26 NOTE — Assessment & Plan Note (Signed)
Lab Results  Component Value Date   CREATININE 1.19 06/26/2022   CREATININE 1.49 (H) 06/25/2022   CREATININE 2.12 (H) 06/24/2022  Also has hyperkalemia -s/p 10 g of Lokelma Holding diuretics due to worsening of kidney function.  Improved with IV hydration

## 2022-06-26 NOTE — Assessment & Plan Note (Signed)
Hold Eliquis.  Status post drainage of hematoma with excisional debridement of necrotic skin/subcutaneous tissue of the left lower extremity, application of wound VAC, insertion of right IJ triple-lumen catheter by vascular surgery on 5/10.  Wound VAC change plan for next Tuesday Central line and Foley were removed on 5/11

## 2022-06-26 NOTE — Assessment & Plan Note (Signed)
Cpap per home setting for his OSA. 

## 2022-06-26 NOTE — Assessment & Plan Note (Signed)
Acute blood loss anemia likely due to hematoma in the leg.  Hemoglobin 7.5.  Will transfuse if it drops less than 7.  Hold Eliquis

## 2022-06-26 NOTE — Progress Notes (Signed)
  Progress Note   Patient: Daniel Eaton. AOZ:[id number] DOB: March 16, 1940 DOA: 06/22/2022     4 DOS: the patient was seen and examined on 06/26/2022   Brief hospital course: 82 year old male with a known history of A-fib on Eliquis, COPD, CHF, anemia is admitted for left lower extremity hematoma burst  5/9: Vascular surgery consult.  Plan for hematoma evacuation in the OR tomorrow 5/10: Drainage of hematoma, right IJ triple-lumen catheter and application of wound VAC 5/11: Added Keflex for potential infected hematoma as he has leukocytosis   Assessment and Plan: * Leg hematoma, left, initial encounter Hold Eliquis.  Status post drainage of hematoma with excisional debridement of necrotic skin/subcutaneous tissue of the left lower extremity, application of wound VAC, insertion of right IJ triple-lumen catheter by vascular surgery on 5/10.  Wound VAC change plan for next Tuesday Central line and Foley were removed on 5/11    Lactic acidosis Resolved with hydration.  Likely due to hypotension.   Hypothyroid Cont levothyroxine.  OSA (obstructive sleep apnea) Cpap per home setting for his OSA  Normocytic anemia Acute blood loss anemia likely due to hematoma in the leg.  Hemoglobin 7.5.  Will transfuse if it drops less than 7.  Hold Eliquis  Acute renal failure superimposed on stage 3a chronic kidney disease (HCC) Lab Results  Component Value Date   CREATININE 1.19 06/26/2022   CREATININE 1.49 (H) 06/25/2022   CREATININE 2.12 (H) 06/24/2022  Also has hyperkalemia -s/p 10 g of Lokelma Holding diuretics due to worsening of kidney function.  Improved with IV hydration    COPD (chronic obstructive pulmonary disease) (HCC) Stable.  On room air.  Essential hypertension Hold metoprolol.  Blood pressure is controlled for now    Atrial fibrillation (HCC) Rate is controlled for now.  Unable to give Eliquis due to large hematoma and anemia.         Subjective: No new issues,  feels tired  Physical Exam: Vitals:   06/25/22 0805 06/25/22 1526 06/25/22 2255 06/26/22 1017  BP: 103/61 (!) 110/56 126/69 101/60  Pulse: 75 83 78 73  Resp:  17 16 16   Temp: 97.8 F (36.6 C) 97.8 F (36.6 C) 98.3 F (36.8 C) 98.4 F (36.9 C)  TempSrc:      SpO2: 97% 98% 96% 97%  Weight:      Height:       82 year old frail looking male lying in the bed comfortably without any acute distress Lungs clear to auscultation bilaterally Heart irregularly irregular heart sounds Abdomen soft, benign, obese Extremities/skin: Dressing in place on left lower extremity with wound VAC Neuro awake and alert, nonfocal Psych normal mood and affect Data Reviewed:  Hemoglobin 7.5  Family Communication: None at bedside  Disposition: Status is: Inpatient Remains inpatient appropriate because: Needs wound VAC changed on coming Tuesday before discharge back to his long-term facility  Planned Discharge Destination: Rehab   DVT prophylaxis-SCDs Time spent: 35 minutes  Author: Delfino Lovett, MD 06/26/2022 11:35 AM  For on call review [email or web address].

## 2022-06-26 NOTE — Assessment & Plan Note (Signed)
Rate is controlled for now.  Unable to give Eliquis due to large hematoma and anemia.  

## 2022-06-27 DIAGNOSIS — Z7189 Other specified counseling: Secondary | ICD-10-CM

## 2022-06-27 DIAGNOSIS — N179 Acute kidney failure, unspecified: Secondary | ICD-10-CM | POA: Diagnosis not present

## 2022-06-27 DIAGNOSIS — D649 Anemia, unspecified: Secondary | ICD-10-CM

## 2022-06-27 DIAGNOSIS — S8012XA Contusion of left lower leg, initial encounter: Secondary | ICD-10-CM | POA: Diagnosis not present

## 2022-06-27 DIAGNOSIS — I10 Essential (primary) hypertension: Secondary | ICD-10-CM | POA: Diagnosis not present

## 2022-06-27 LAB — BPAM RBC
Blood Product Expiration Date: 202406122359
Blood Product Expiration Date: 202406122359
ISSUE DATE / TIME: 202405081730
ISSUE DATE / TIME: 202405100941
Unit Type and Rh: 6200
Unit Type and Rh: 6200

## 2022-06-27 LAB — CBC
HCT: 26.9 % — ABNORMAL LOW (ref 39.0–52.0)
Hemoglobin: 8.3 g/dL — ABNORMAL LOW (ref 13.0–17.0)
MCH: 31 pg (ref 26.0–34.0)
MCHC: 30.9 g/dL (ref 30.0–36.0)
MCV: 100.4 fL — ABNORMAL HIGH (ref 80.0–100.0)
Platelets: 237 10*3/uL (ref 150–400)
RBC: 2.68 MIL/uL — ABNORMAL LOW (ref 4.22–5.81)
RDW: 19.3 % — ABNORMAL HIGH (ref 11.5–15.5)
WBC: 10.2 10*3/uL (ref 4.0–10.5)
nRBC: 2.4 % — ABNORMAL HIGH (ref 0.0–0.2)

## 2022-06-27 LAB — TYPE AND SCREEN

## 2022-06-27 LAB — BASIC METABOLIC PANEL
Anion gap: 7 (ref 5–15)
BUN: 36 mg/dL — ABNORMAL HIGH (ref 8–23)
CO2: 26 mmol/L (ref 22–32)
Calcium: 7.9 mg/dL — ABNORMAL LOW (ref 8.9–10.3)
Chloride: 101 mmol/L (ref 98–111)
Creatinine, Ser: 0.96 mg/dL (ref 0.61–1.24)
GFR, Estimated: >60 mL/min (ref >60)
Glucose, Bld: 76 mg/dL (ref 70–99)
Potassium: 3.8 mmol/L (ref 3.5–5.1)
Sodium: 134 mmol/L — ABNORMAL LOW (ref 135–145)

## 2022-06-27 MED ORDER — OXYCODONE-ACETAMINOPHEN 5-325 MG PO TABS
1.0000 | ORAL_TABLET | Freq: Four times a day (QID) | ORAL | Status: DC | PRN
  Administered 2022-06-28: 2 via ORAL
  Filled 2022-06-27 (×2): qty 2

## 2022-06-27 MED ORDER — POLYETHYLENE GLYCOL 3350 17 G PO PACK
17.0000 g | PACK | Freq: Every day | ORAL | Status: DC
  Administered 2022-06-27 – 2022-06-29 (×2): 17 g via ORAL
  Filled 2022-06-27 (×2): qty 1

## 2022-06-27 MED ORDER — SENNOSIDES-DOCUSATE SODIUM 8.6-50 MG PO TABS
2.0000 | ORAL_TABLET | Freq: Two times a day (BID) | ORAL | Status: DC
  Administered 2022-06-27 – 2022-06-29 (×3): 2 via ORAL
  Filled 2022-06-27 (×3): qty 2

## 2022-06-27 NOTE — Assessment & Plan Note (Signed)
Hold metoprolol.  Blood pressure is controlled for now

## 2022-06-27 NOTE — Consult Note (Addendum)
Consultation Note Date: 06/27/2022   Patient Name: Daniel Eaton.  DOB: 12/12/1940  MRN: [id number]  Age / Sex: 82 y.o., male  PCP: Lauro Regulus, MD Referring Physician: Delfino Lovett, MD  Reason for Consultation: Establishing goals of care  HPI/Patient Profile:  82 year old male with a known history of A-fib on Eliquis, COPD, CHF, anemia is admitted for left lower extremity hematoma burst.  Patient is now postop day 3 from incision and drainage of abscess and hematoma of the left lower extremity.  The same time patient had a right IJ triple-lumen catheter placed without any complications.  Also had wound VAC placed to his left lower extremity after incision and drainage .  Clinical Assessment and Goals of Care: Notes and labs reviewed.  In to see patient.  He denies complaint at this time.  Patient is currently resting in bed with significant other at bedside.  Patient requesting significant other to do things for him, but not really answering her questions as she is trying to help him.  Patient and significant other are raising voices and significant other is tearful stating that she is leaving.  Stepped out with her.  She discusses that they have been together for many years.  She states that they had a civil union, but were never legally married.  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  She discusses decline as he went into the hospital and to rehab and back out again, each time more frail.  She discusses his need for Eliquis, and having ruptured hematomas.  She discusses needing to work full-time at nursing facility herself, and that this is difficult as he will reach out to her numerous times a day to ask her to order things on [employer], or ask her to  bring things to him.  She discusses that he goes to rehab, but will not work to get stronger, and with coming home will not work to be stronger either.  She states she cannot provide care for him as currently he needs a Nurse, adult.  She discusses that she would like to go back in and speak with him and visit, and then she will be going home for the day.  Discussed that I would follow-up again tomorrow.  ADDENDUM: Returned to bedside to try to speak with patient after significant other had left.  He discusses several issues with the bed itself, and issues with his tray table.  Staff enters to bring his dinner and he requests their assistance with setting this up and detailed discussion on arranging his table.  Discussed that I would let staff know his needs, and follow-up with him again tomorrow.        SUMMARY OF RECOMMENDATIONS   PMT will follow.  Prognosis:  Unable to determine       Primary Diagnoses: Present  on Admission:  Acute renal failure superimposed on stage 3a chronic kidney disease (HCC)  Atrial fibrillation (HCC)  COPD (chronic obstructive pulmonary disease) (HCC)  Essential hypertension  Hypothyroid  Leg hematoma, left, initial encounter  OSA (obstructive sleep apnea)  Lactic acidosis  Normocytic anemia   I have reviewed the medical record, interviewed the patient and family, and examined the patient. The following aspects are pertinent.  Past Medical History:  Diagnosis Date   A-fib (HCC)    Cellulitis    CHF (congestive heart failure) (HCC)    COPD (chronic obstructive pulmonary disease) (HCC)    Difficult intubation    Hyperlipidemia    Hypertension    Hypothyroidism    Kidney disease    Sleep apnea    Stroke [facility])    Thyroid disease    Social History   Socioeconomic History   Marital status: Divorced    Spouse name: Not on file   Number of children: Not on file   Years of education: Not on file   Highest education level: Not on file   Occupational History   Not on file  Tobacco Use   Smoking status: Former    Packs/day: 1.00    Years: 41.00    Additional pack years: 0.00    Total pack years: 41.00    Types: Cigarettes    Quit date: 11    Years since quitting: 25.3   Smokeless tobacco: Never  Vaping Use   Vaping Use: Never used  Substance and Sexual Activity   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   Drug use: No   Sexual activity: Not Currently  Other Topics Concern   Not on file  Social History Narrative   Not on file   Social Determinants of Health   Financial Resource Strain: Not on file  Food Insecurity: No Food Insecurity (06/23/2022)   Hunger Vital Sign    Worried About Running Out of Food in the Last Year: Never true    Ran Out of Food in the Last Year: Never true  Transportation Needs: No Transportation Needs (06/23/2022)   PRAPARE - Administrator, Civil Service (Medical): No    Lack of Transportation (Non-Medical): No  Physical Activity: Not on file  Stress: Not on file  Social Connections: Not on file   Family History  Problem Relation Age of Onset   Heart disease Mother    Alcohol abuse Father    Bladder Cancer Sister    Scheduled Meds:  amiodarone  200 mg Oral Daily   cephALEXin  250 mg Oral Q8H   chlorhexidine  60 mL Topical Once   Chlorhexidine Gluconate Cloth  6 each Topical Daily   fluticasone furoate-vilanterol  1 puff Inhalation Daily   And   umeclidinium bromide  1 puff Inhalation Daily   levothyroxine  88 mcg Oral Daily   loratadine  10 mg Oral Daily   multivitamin with minerals  1 tablet Oral Daily   nutrition supplement (JUVEN)  1 packet Oral BID BM   sodium chloride flush  3 mL Intravenous Q12H   Continuous Infusions: PRN Meds:.acetaminophen **OR** acetaminophen, albuterol, HYDROcodone-acetaminophen, ondansetron (ZOFRAN) IV, oxyCODONE-acetaminophen Medications Prior to Admission:  Prior to Admission medications   Medication Sig Start Date End  Date Taking? Authorizing Provider  acetaminophen (TYLENOL) 500 MG tablet Take 500 mg by mouth every 12 (twelve) hours.   Yes [provider]  acidophilus (RISAQUAD) CAPS capsule Take 1 capsule  by mouth 2 (two) times daily.   Yes [provider]  albuterol (VENTOLIN HFA) 108 (90 Base) MCG/ACT inhaler Inhale 2 puffs into the lungs every 6 (six) hours as needed for wheezing or shortness of breath. 02/17/21  Yes Darlin Priestly, MD  amiodarone (PACERONE) 200 MG tablet Take 200 mg by mouth daily. 02/20/14  Yes [provider]  apixaban (ELIQUIS) 5 MG TABS tablet Take 5 mg by mouth 2 (two) times daily.   Yes [provider]  Capsaicin (ASPERCREME PAIN RELIEF PATCH EX) Apply topically every 12 (twelve) hours.   Yes [provider]  Carboxymethylcellulose Sod PF (REFRESH CELLUVISC) 1 % GEL Apply 1 drop to eye every 12 (twelve) hours. Right eye   Yes [provider]  ferrous sulfate 325 (65 FE) MG tablet Take 1 tablet (325 mg total) by mouth daily. 12/09/21 06/22/22 Yes Chesley Noon, MD  Fluticasone-Umeclidin-Vilant (TRELEGY ELLIPTA) 100-62.5-25 MCG/INH AEPB Inhale 1 puff into the lungs daily.   Yes [provider]  HYDROcodone-acetaminophen (NORCO/VICODIN) 5-325 MG tablet Take 1 tablet by mouth every 8 (eight) hours as needed for moderate pain.   Yes [provider]  levothyroxine (SYNTHROID) 88 MCG tablet Take 88 mcg by mouth daily.  02/20/14  Yes [provider]  loratadine (CLARITIN) 10 MG tablet Take 10 mg by mouth daily.   Yes [provider]  metoprolol succinate (TOPROL-XL) 50 MG 24 hr tablet Take 50 mg by mouth daily. Take with or immediately following a meal.   Yes [provider]  Multiple Vitamin (MULTI-VITAMINS) TABS Take 1 tablet by mouth daily.   Yes [provider]  omeprazole (PRILOSEC) 20 MG capsule Take 20 mg by mouth daily. 02/20/14  Yes [provider]  polyethylene glycol (MIRALAX /  GLYCOLAX) 17 g packet Take 17 g by mouth daily as needed. Patient taking differently: Take 17 g by mouth daily as needed for mild constipation. 08/03/18  Yes Ojie, Jude, MD  potassium chloride SA (KLOR-CON M) 20 MEQ tablet Take 20 mEq by mouth daily.   Yes [provider]  sertraline (ZOLOFT) 50 MG tablet Take 50 mg by mouth daily.   Yes [provider]  spironolactone (ALDACTONE) 25 MG tablet Take 25 mg by mouth daily.   Yes [provider]  torsemide (DEMADEX) 20 MG tablet Hold pending outpatient followup due to acute kidney injury. Patient taking differently: Take 20 mg by mouth daily. 02/17/21  Yes Darlin Priestly, MD  traZODone (DESYREL) 50 MG tablet Take 25 mg by mouth at bedtime.   Yes [provider]  albuterol (VENTOLIN HFA) 108 (90 Base) MCG/ACT inhaler Inhale 2 puffs into the lungs every 6 (six) hours as needed for wheezing. Patient not taking: Reported on 06/22/2022    [provider]  ammonium lactate (LAC-HYDRIN) 12 % lotion Apply 1 Application topically as needed for dry skin. Patient not taking: Reported on 06/06/2022    [provider]  guaiFENesin (MUCINEX) 600 MG 12 hr tablet Take 600 mg by mouth See admin instructions. Bid x 5 days Patient not taking: Reported on 08/24/2021    [provider]  hydrOXYzine (ATARAX) 25 MG tablet Take 25 mg by mouth See admin instructions. Every 12 hours x 30 days for pruritis Patient not taking: Reported on 06/06/2022    [provider]  metolazone (ZAROXOLYN) 2.5 MG tablet Hold pending outpatient followup due to acute kidney injury. Patient not taking: Reported on 03/06/2021 02/17/21   Darlin Priestly, MD  Multiple Vitamins-Minerals (DECUBI-VITE PO) Take 1 capsule by mouth daily. 400 mcg -50 mg Patient not taking: Reported on 06/22/2022    [provider]  Omega-3 Fatty Acids (OMEGA 3 PO) Take 2,000 mg by mouth daily. Patient not taking: Reported on 06/22/2022    [provider]   predniSONE (DELTASONE) 20 MG tablet Take 40 mg by mouth See admin instructions. Qd x 5 days Patient not taking: Reported on 08/24/2021    [provider]   Allergies  Allergen Reactions   Diltiazem Hcl Itching and Other (See Comments)    edema   Review of Systems  All other systems reviewed and are negative.   Physical Exam Pulmonary:     Effort: Pulmonary effort is normal.  Neurological:     Mental Status: He is alert.     Vital Signs: BP (!) 109/57   Pulse (!) 101   Temp 98.1 F (36.7 C)   Resp 17   Ht 5\' 11"  (1.803 m)   Wt 123 kg   SpO2 94%   BMI 37.82 kg/m  Pain Scale: 0-10   Pain Score: 9    SpO2: SpO2: 94 % O2 Device:SpO2: 94 % O2 Flow Rate: .O2 Flow Rate (L/min): 2 L/min  IO: Intake/output summary:  Intake/Output Summary (Last 24 hours) at 06/27/2022 1532 Last data filed at 06/27/2022 0900 Gross per 24 hour  Intake --  Output 1100 ml  Net -1100 ml    LBM: Last BM Date : 06/23/22 Baseline Weight: Weight: 113.3 kg Most recent weight: Weight: 123 kg       Signed by: Morton Stall, NP   Please contact Palliative Medicine Team phone at [phone] for questions and concerns.  For individual provider: See Loretha Stapler

## 2022-06-27 NOTE — Assessment & Plan Note (Signed)
Stable.  On room air 

## 2022-06-27 NOTE — TOC Progression Note (Addendum)
Transition of Care (TOC) - Progression Note    Patient Details  Name: Daniel Eaton. MRN: [id number] Date of Birth: 1940/10/21  Transition of Care [facility]) CM/SW Contact  Marlowe Sax, RN Phone Number: 06/27/2022, 10:22 AM  Clinical Narrative:   The patient is a long term resident at [facility] He is wheelchair bound Plan to change wound vac tomorrow Will DC back to [facility] Notified Stanton Kidney at [facility] that he will need a wound vac when he returns   Expected Discharge Plan: Skilled Nursing Facility Barriers to Discharge: Continued Medical Work up  Expected Discharge Plan and Services       Living arrangements for the past 2 months: Skilled Nursing Facility                                       Social Determinants of Health (SDOH) Interventions SDOH Screenings   Food Insecurity: No Food Insecurity (06/23/2022)  Housing: Low Risk  (06/23/2022)  Transportation Needs: No Transportation Needs (06/23/2022)  Utilities: Not At Risk (06/23/2022)  Depression (PHQ2-9): Low Risk  (06/06/2022)  Tobacco Use: Medium Risk (06/25/2022)    Readmission Risk Interventions     No data to display

## 2022-06-27 NOTE — Assessment & Plan Note (Signed)
Acute blood loss anemia likely due to hematoma in the leg.  Hemoglobin 8.3.  Will transfuse if it drops less than 7.  Hold Eliquis

## 2022-06-27 NOTE — Assessment & Plan Note (Signed)
Hold Eliquis.  Status post drainage of hematoma with excisional debridement of necrotic skin/subcutaneous tissue of the left lower extremity, application of wound VAC, insertion of right IJ triple-lumen catheter by vascular surgery on 5/10.  Wound VAC change plan tomorrow Central line and Foley were removed on 5/11

## 2022-06-27 NOTE — Care Management Important Message (Signed)
Important Message  Patient Details  Name: Daniel Eaton. MRN: [id number] Date of Birth: Sep 23, 1940   Medicare Important Message Given:  Yes     Olegario Messier A Jc Veron 06/27/2022, 2:24 PM

## 2022-06-27 NOTE — Progress Notes (Signed)
  Progress Note   Patient: Daniel Eaton. ZOX:[id number] DOB: October 08, 1940 DOA: 06/22/2022     5 DOS: the patient was seen and examined on 06/27/2022   Brief hospital course: 82 year old male with a known history of A-fib on Eliquis, COPD, CHF, anemia is admitted for left lower extremity hematoma burst  5/9: Vascular surgery consult.  Plan for hematoma evacuation in the OR tomorrow 5/10: Drainage of hematoma, right IJ triple-lumen catheter and application of wound VAC 5/11: Added Keflex for potential infected hematoma as he has leukocytosis 5/12: Wound culture growing Staph aureus and procidentia that lab could not confirm if it is MRSA or MSSA yet  Assessment and Plan: * Leg hematoma, left, initial encounter Hold Eliquis.  Status post drainage of hematoma with excisional debridement of necrotic skin/subcutaneous tissue of the left lower extremity, application of wound VAC, insertion of right IJ triple-lumen catheter by vascular surgery on 5/10.  Wound VAC change plan tomorrow Central line and Foley were removed on 5/11    Lactic acidosis Resolved with hydration.  Likely due to hypotension.   Hypothyroid Cont levothyroxine.  OSA (obstructive sleep apnea) Cpap per home setting for his OSA  Normocytic anemia Acute blood loss anemia likely due to hematoma in the leg.  Hemoglobin 8.3.  Will transfuse if it drops less than 7.  Hold Eliquis  Acute renal failure superimposed on stage 3a chronic kidney disease (HCC) Lab Results  Component Value Date   CREATININE 0.96 06/27/2022   CREATININE 1.19 06/26/2022   CREATININE 1.49 (H) 06/25/2022  Also has hyperkalemia -s/p 10 g of Lokelma Holding diuretics due to worsening of kidney function.  Improved with IV hydration    COPD (chronic obstructive pulmonary disease) (HCC) Stable.  On room air.  Essential hypertension Hold metoprolol.  Blood pressure is controlled for now    Atrial fibrillation (HCC) Rate is controlled for now.   Unable to give Eliquis due to large hematoma and anemia.         Subjective: He was annoyed as his wound VAC was keep beeping/alarm.  Had questions around timing of wound VAC change and if he will need wound VAC at the facility.  Physical Exam: Vitals:   06/26/22 1529 06/26/22 2318 06/27/22 0722 06/27/22 1530  BP: 105/66 133/63 (!) 112/59 (!) 109/57  Pulse: 77 85 96 (!) 101  Resp: 16 18 16 17   Temp: 98.3 F (36.8 C) 97.7 F (36.5 C) 98.4 F (36.9 C) 98.1 F (36.7 C)  TempSrc: Oral     SpO2: 97% 93% 91% 94%  Weight:      Height:       82 year old frail looking male lying in the bed comfortably without any acute distress Lungs clear to auscultation bilaterally Heart irregularly irregular heart sounds Abdomen soft, benign, obese Extremities/skin: Dressing in place on left lower extremity with wound VAC Neuro awake and alert, nonfocal Psych normal mood and affect Data Reviewed:  Hemoglobin 8.3  Family Communication: None at bedside  Disposition: Status is: Inpatient Remains inpatient appropriate because: Management of hematoma.  Needing wound VAC change tomorrow  Planned Discharge Destination:  [facility]   DVT prophylax/SCDs Time spent: 35 minutes  Author: Delfino Lovett, MD 06/27/2022 4:16 PM  For on call review [email or web address].

## 2022-06-27 NOTE — Assessment & Plan Note (Signed)
Cpap per home setting for his OSA. 

## 2022-06-27 NOTE — Assessment & Plan Note (Signed)
Resolved with hydration.  Likely due to hypotension.

## 2022-06-27 NOTE — Progress Notes (Signed)
Progress Note    06/27/2022 3:01 PM 3 Days Post-Op  Subjective:  82 year old male with a known history of A-fib on Eliquis, COPD, CHF, anemia is admitted for left lower extremity hematoma burst.  Patient is now postop day 3 from incision and drainage of abscess and hematoma of the left lower extremity.  The same time patient had a right IJ triple-lumen catheter placed without any complications.  Also had wound VAC placed to his left lower extremity after incision and drainage.  On exam this morning patient was resting comfortably in bed.  Wound VAC was in place and working well.  She has no other complaints overnight.  Vitals all remained stable.   Vitals:   06/26/22 2318 06/27/22 0722  BP: 133/63 (!) 112/59  Pulse: 85 96  Resp: 18 16  Temp: 97.7 F (36.5 C) 98.4 F (36.9 C)  SpO2: 93% 91%   Physical Exam: Cardiac:  Irregular R&R Hx of Atrial Fibrillation, No murmurs Lungs: Auscultation clear bilaterally with diminished air movement in the bases bilaterally.  No rales rhonchi or wheezing noted. Incisions: Left lower extremity with wound VAC as dressing for the incision sites.  Incision of left upper thigh and an incision in the left lower calf region.  Wound VAC is working well. Extremities: Bilateral lower extremities have erythema throughout.  Right lower extremity has positive PT pulse.  Left lower extremity due to the swelling has nonpalpable pulses.  Lower extremity with 2 incisions 1 in the thigh 1 in the calf dressed with a wound VAC. Abdomen: Positive bowel sounds throughout, soft, nontender, nondistended. Neurologic: Alert and oriented x 3, follows all commands, answers questions appropriately.  CBC    Component Value Date/Time   WBC 10.2 06/27/2022 0417   RBC 2.68 (L) 06/27/2022 0417   HGB 8.3 (L) 06/27/2022 0417   HGB 15.9 06/18/2012 1151   HCT 26.9 (L) 06/27/2022 0417   HCT 47.2 06/18/2012 1151   PLT 237 06/27/2022 0417   PLT 285 06/18/2012 1151   MCV 100.4 (H)  06/27/2022 0417   MCV 99 06/18/2012 1151   MCH 31.0 06/27/2022 0417   MCHC 30.9 06/27/2022 0417   RDW 19.3 (H) 06/27/2022 0417   RDW 16.2 (H) 06/18/2012 1151   LYMPHSABS 1.7 06/22/2022 1415   LYMPHSABS 1.7 06/18/2012 1151   MONOABS 1.2 (H) 06/22/2022 1415   MONOABS 1.1 (H) 06/18/2012 1151   EOSABS 0.0 06/22/2022 1415   EOSABS 0.0 06/18/2012 1151   BASOSABS 0.0 06/22/2022 1415   BASOSABS 0.1 06/18/2012 1151    BMET    Component Value Date/Time   NA 134 (L) 06/27/2022 0417   NA 138 06/20/2012 0757   K 3.8 06/27/2022 0417   K 2.9 (L) 06/20/2012 0757   CL 101 06/27/2022 0417   CL 97 (L) 06/20/2012 0757   CO2 26 06/27/2022 0417   CO2 36 (H) 06/20/2012 0757   GLUCOSE 76 06/27/2022 0417   GLUCOSE 87 06/20/2012 0757   BUN 36 (H) 06/27/2022 0417   BUN 34 (H) 06/20/2012 0757   CREATININE 0.96 06/27/2022 0417   CREATININE 1.59 (H) 07/23/2013 1345   CALCIUM 7.9 (L) 06/27/2022 0417   CALCIUM 8.1 (L) 06/20/2012 0757   GFRNONAA >60 06/27/2022 0417   GFRNONAA 42 (L) 07/23/2013 1345   GFRAA >60 12/14/2018 0452   GFRAA 49 (L) 07/23/2013 1345    INR    Component Value Date/Time   INR 1.5 (H) 06/24/2022 1116     Intake/Output Summary (Last 24  hours) at 06/27/2022 1501 Last data filed at 06/27/2022 0900 Gross per 24 hour  Intake --  Output 1100 ml  Net -1100 ml     Assessment/Plan:  82 y.o. male is s/p incision and drainage of abscess/hematoma of the left lower extremity.  3 Days Post-Op   PLAN: Vascular surgery plans on taking the patient back to the operating room on 06/28/2022 for a wound VAC change and evaluation of left lower extremity abscess/hematoma removal from 06/24/2022.  Current wound VAC is in place and working well.  I discussed in detail with the patient today taking him back to the operating room tomorrow for wound VAC change.  We discussed in detail the procedure, benefits, risks, and complications.  He verbalizes understanding.  I answered all the patient's  questions.  He will be made n.p.o. after midnight tonight.   DVT prophylaxis:  ON Hold due to bleeding    Marcie Bal [facility] 06/27/2022 3:01 PM

## 2022-06-27 NOTE — Assessment & Plan Note (Signed)
Cont levothyroxine.   

## 2022-06-27 NOTE — Assessment & Plan Note (Signed)
Lab Results  Component Value Date   CREATININE 0.96 06/27/2022   CREATININE 1.19 06/26/2022   CREATININE 1.49 (H) 06/25/2022  Also has hyperkalemia -s/p 10 g of Lokelma Holding diuretics due to worsening of kidney function.  Improved with IV hydration

## 2022-06-27 NOTE — Assessment & Plan Note (Signed)
Rate is controlled for now.  Unable to give Eliquis due to large hematoma and anemia.

## 2022-06-28 ENCOUNTER — Encounter
Admission: EM | Disposition: A | Discharge status: Skilled Nursing Facility | Payer: Self-pay | Source: Home / Self Care | Attending: Internal Medicine

## 2022-06-28 ENCOUNTER — Encounter: Payer: Self-pay | Admitting: Internal Medicine

## 2022-06-28 ENCOUNTER — Inpatient Hospital Stay: Payer: PPO | Admitting: Anesthesiology

## 2022-06-28 ENCOUNTER — Other Ambulatory Visit: Payer: Self-pay

## 2022-06-28 DIAGNOSIS — D649 Anemia, unspecified: Secondary | ICD-10-CM | POA: Diagnosis not present

## 2022-06-28 DIAGNOSIS — I96 Gangrene, not elsewhere classified: Secondary | ICD-10-CM | POA: Diagnosis not present

## 2022-06-28 DIAGNOSIS — S8012XA Contusion of left lower leg, initial encounter: Secondary | ICD-10-CM | POA: Diagnosis not present

## 2022-06-28 DIAGNOSIS — Z7189 Other specified counseling: Secondary | ICD-10-CM | POA: Diagnosis not present

## 2022-06-28 DIAGNOSIS — N179 Acute kidney failure, unspecified: Secondary | ICD-10-CM | POA: Diagnosis not present

## 2022-06-28 DIAGNOSIS — E872 Acidosis, unspecified: Secondary | ICD-10-CM | POA: Diagnosis not present

## 2022-06-28 HISTORY — PX: APPLICATION OF WOUND VAC: SHX5189

## 2022-06-28 LAB — BASIC METABOLIC PANEL
Anion gap: 5 (ref 5–15)
BUN: 26 mg/dL — ABNORMAL HIGH (ref 8–23)
CO2: 27 mmol/L (ref 22–32)
Calcium: 7.9 mg/dL — ABNORMAL LOW (ref 8.9–10.3)
Chloride: 103 mmol/L (ref 98–111)
Creatinine, Ser: 0.75 mg/dL (ref 0.61–1.24)
GFR, Estimated: >60 mL/min (ref >60)
Glucose, Bld: 90 mg/dL (ref 70–99)
Potassium: 3.7 mmol/L (ref 3.5–5.1)
Sodium: 135 mmol/L (ref 135–145)

## 2022-06-28 LAB — CBC
HCT: 26.5 % — ABNORMAL LOW (ref 39.0–52.0)
Hemoglobin: 8.1 g/dL — ABNORMAL LOW (ref 13.0–17.0)
MCH: 30.8 pg (ref 26.0–34.0)
MCHC: 30.6 g/dL (ref 30.0–36.0)
MCV: 100.8 fL — ABNORMAL HIGH (ref 80.0–100.0)
Platelets: 257 10*3/uL (ref 150–400)
RBC: 2.63 MIL/uL — ABNORMAL LOW (ref 4.22–5.81)
RDW: 19.4 % — ABNORMAL HIGH (ref 11.5–15.5)
WBC: 10.6 10*3/uL — ABNORMAL HIGH (ref 4.0–10.5)
nRBC: 1.7 % — ABNORMAL HIGH (ref 0.0–0.2)

## 2022-06-28 SURGERY — APPLICATION, WOUND VAC
Anesthesia: General

## 2022-06-28 MED ORDER — OXYCODONE HCL 5 MG PO TABS: 5.0000 mg | ORAL_TABLET | Freq: Once | ORAL | Status: DC | PRN

## 2022-06-28 MED ORDER — MORPHINE SULFATE (PF) 2 MG/ML IV SOLN
2.0000 mg | INTRAVENOUS | Status: AC
  Administered 2022-06-28: 2 mg via INTRAVENOUS
  Filled 2022-06-28: qty 1

## 2022-06-28 MED ORDER — CHLORHEXIDINE GLUCONATE 4 % EX SOLN: 60.0000 mL | Freq: Once | CUTANEOUS | Status: DC

## 2022-06-28 MED ORDER — OXYCODONE HCL 5 MG/5ML PO SOLN: 5.0000 mg | Freq: Once | ORAL | Status: DC | PRN

## 2022-06-28 MED ORDER — VASOPRESSIN 20 UNIT/ML IV SOLN
INTRAVENOUS | Status: DC | PRN
  Administered 2022-06-28: 4 [IU] via INTRAVENOUS

## 2022-06-28 MED ORDER — LINEZOLID 600 MG PO TABS: 600.0000 mg | ORAL_TABLET | Freq: Two times a day (BID) | ORAL | 0 refills | Status: AC

## 2022-06-28 MED ORDER — FENTANYL CITRATE (PF) 100 MCG/2ML IJ SOLN
INTRAMUSCULAR | Status: AC
  Filled 2022-06-28: qty 2

## 2022-06-28 MED ORDER — PHENYLEPHRINE HCL (PRESSORS) 10 MG/ML IV SOLN
INTRAVENOUS | Status: DC | PRN
  Administered 2022-06-28: 160 ug via INTRAVENOUS

## 2022-06-28 MED ORDER — BACITRACIN ZINC 500 UNIT/GM EX OINT
TOPICAL_OINTMENT | Freq: Two times a day (BID) | CUTANEOUS | Status: DC
  Filled 2022-06-28 (×3): qty 0.9

## 2022-06-28 MED ORDER — CEFAZOLIN IN SODIUM CHLORIDE 3-0.9 GM/100ML-% IV SOLN
3.0000 g | INTRAVENOUS | Status: AC
  Administered 2022-06-28: 3 g via INTRAVENOUS
  Filled 2022-06-28: qty 100

## 2022-06-28 MED ORDER — BACITRACIN ZINC 500 UNIT/GM EX OINT
TOPICAL_OINTMENT | Freq: Two times a day (BID) | CUTANEOUS | Status: DC
  Filled 2022-06-28: qty 0.9

## 2022-06-28 MED ORDER — CIPROFLOXACIN HCL 500 MG PO TABS: 500.0000 mg | ORAL_TABLET | Freq: Two times a day (BID) | ORAL | 0 refills | Status: AC

## 2022-06-28 MED ORDER — PROPOFOL 10 MG/ML IV BOLUS
INTRAVENOUS | Status: DC | PRN
  Administered 2022-06-28: 20 mg via INTRAVENOUS

## 2022-06-28 MED ORDER — SENNOSIDES-DOCUSATE SODIUM 8.6-50 MG PO TABS: 2.0000 | ORAL_TABLET | Freq: Two times a day (BID) | ORAL | 0 refills | Status: AC

## 2022-06-28 MED ORDER — DEXMEDETOMIDINE HCL IN NACL 200 MCG/50ML IV SOLN
INTRAVENOUS | Status: DC | PRN
  Administered 2022-06-28: 12 ug via INTRAVENOUS

## 2022-06-28 MED ORDER — HYDROCODONE-ACETAMINOPHEN 5-325 MG PO TABS: 1.0000 | ORAL_TABLET | ORAL | 0 refills | Status: AC | PRN

## 2022-06-28 MED ORDER — FENTANYL CITRATE (PF) 100 MCG/2ML IJ SOLN
25.0000 ug | INTRAMUSCULAR | Status: DC | PRN
  Administered 2022-06-28 (×4): 25 ug via INTRAVENOUS

## 2022-06-28 MED ORDER — PROPOFOL 500 MG/50ML IV EMUL
INTRAVENOUS | Status: DC | PRN
  Administered 2022-06-28: 100 ug/kg/min via INTRAVENOUS

## 2022-06-28 MED ORDER — CEFAZOLIN SODIUM-DEXTROSE 2-4 GM/100ML-% IV SOLN
INTRAVENOUS | Status: AC
  Filled 2022-06-28: qty 100

## 2022-06-28 MED ORDER — SODIUM CHLORIDE 0.9 % IV SOLN: INTRAVENOUS | Status: DC

## 2022-06-28 MED ORDER — APIXABAN 5 MG PO TABS: 5.0000 mg | ORAL_TABLET | Freq: Two times a day (BID) | ORAL | Status: DC

## 2022-06-28 MED ORDER — CIPROFLOXACIN HCL 500 MG PO TABS
500.0000 mg | ORAL_TABLET | Freq: Two times a day (BID) | ORAL | Status: DC
  Administered 2022-06-28 – 2022-06-29 (×2): 500 mg via ORAL
  Filled 2022-06-28 (×2): qty 1

## 2022-06-28 MED ORDER — LINEZOLID 600 MG PO TABS
600.0000 mg | ORAL_TABLET | Freq: Two times a day (BID) | ORAL | Status: DC
  Administered 2022-06-28 – 2022-06-29 (×2): 600 mg via ORAL
  Filled 2022-06-28 (×3): qty 1

## 2022-06-28 MED ORDER — PROPOFOL 10 MG/ML IV BOLUS
INTRAVENOUS | Status: AC
  Filled 2022-06-28: qty 20

## 2022-06-28 SURGICAL SUPPLY — 24 items
BNDG CMPR 75X21 PLY HI ABS (MISCELLANEOUS) ×2
CANISTER WOUND CARE 500ML ATS (WOUND CARE) ×2 IMPLANT
DRAPE FOR WOUND VAC UNIT (DRAPES) ×1 IMPLANT
DRSG VAC GRANUFOAM LG (GAUZE/BANDAGES/DRESSINGS) ×1 IMPLANT
ELECT REM PT RETURN 9FT ADLT (ELECTROSURGICAL) ×2
ELECTRODE REM PT RTRN 9FT ADLT (ELECTROSURGICAL) ×2 IMPLANT
GAUZE SPONGE 4X4 12PLY STRL (GAUZE/BANDAGES/DRESSINGS) ×2 IMPLANT
GAUZE STRETCH 2X75IN STRL (MISCELLANEOUS) ×2 IMPLANT
GLOVE BIO SURGEON STRL SZ7 (GLOVE) ×2 IMPLANT
GLOVE SURG SYN 8.0 (GLOVE) ×2 IMPLANT
GLOVE SURG SYN 8.0 PF PI (GLOVE) ×1 IMPLANT
GOWN STRL REUS W/ TWL LRG LVL3 (GOWN DISPOSABLE) ×4 IMPLANT
GOWN STRL REUS W/ TWL XL LVL3 (GOWN DISPOSABLE) ×2 IMPLANT
GOWN STRL REUS W/TWL LRG LVL3 (GOWN DISPOSABLE) ×4
GOWN STRL REUS W/TWL XL LVL3 (GOWN DISPOSABLE) ×2
KIT TURNOVER KIT A (KITS) ×2 IMPLANT
LABEL OR SOLS (LABEL) ×2 IMPLANT
MANIFOLD NEPTUNE II (INSTRUMENTS) ×2 IMPLANT
NS IRRIG 500ML POUR BTL (IV SOLUTION) ×2 IMPLANT
PACK EXTREMITY ARMC (MISCELLANEOUS) ×2 IMPLANT
SOL PREP POV-IOD 4OZ 10% (MISCELLANEOUS) ×1 IMPLANT
STOCKINETTE STRL 6IN 960660 (GAUZE/BANDAGES/DRESSINGS) ×2 IMPLANT
TRAP FLUID SMOKE EVACUATOR (MISCELLANEOUS) ×2 IMPLANT
WATER STERILE IRR 500ML POUR (IV SOLUTION) ×1 IMPLANT

## 2022-06-28 NOTE — Assessment & Plan Note (Signed)
Cont levothyroxine.   

## 2022-06-28 NOTE — Assessment & Plan Note (Signed)
Stable.  On room air 

## 2022-06-28 NOTE — Anesthesia Procedure Notes (Signed)
Date/Time: 06/28/2022 4:23 PM  Performed by: Stormy Fabian, CRNAPre-anesthesia Checklist: Patient identified, Emergency Drugs available, Suction available and Patient being monitored Patient Re-evaluated:Patient Re-evaluated prior to induction Oxygen Delivery Method: Simple face mask Induction Type: IV induction Dental Injury: Teeth and Oropharynx as per pre-operative assessment

## 2022-06-28 NOTE — Op Note (Signed)
    OPERATIVE NOTE   PROCEDURE: VAC wound dressing change under anesthesia  PRE-OPERATIVE DIAGNOSIS: Infected hematoma left lower extremity  POST-OPERATIVE DIAGNOSIS: Same  SURGEON: Earl Lites Vineta Carone  ASSISTANT(S): Rolla Plate, NP   ANESTHESIA: MAC  ESTIMATED BLOOD LOSS: <10 cc  FINDING(S): The lower wound (more distal wound) is now very superficial and well granulated.  The more proximal wound is clean there is no drainage.  Tissue appears viable but there is still minimal granulation.  The skin bridge between the 2 wounds appears to adhered to the subcutaneous tissues  SPECIMEN(S): None  INDICATIONS:   Daniel Eaton. is a 82 y.o. male who presents status post incision and drainage with associated debridement of his left lower extremity hematoma.  He is brought back to the operating room to evaluate the wound and do the initial VAC dressing change.  Risk and benefits were reviewed all questions were answered patient is agreed to proceed.  DESCRIPTION: After full informed written consent was obtained from the patient, the patient was brought back to the operating room and placed supine upon the operating table.  Prior to induction, the patient received IV antibiotics.   After obtaining adequate anesthesia, the patient was then prepped and draped in the standard fashion for a VAC dressing change and possible debridement.  The existing VAC dressing is removed and the left leg is prepped and draped with Betadine in a circumferential fashion.  Inspection of the wounds demonstrates that the wound beds appear clean and healthy the more distal wound is actually granulating quite well.  The more proximal wound is improved.  No further debridement is needed.  The wounds are measured the proximal wound measures 18 cm x 8 cm.  The distal wound measures 14 cm x 6 cm.  A large black VAC sponge is then delivered onto the field and 2 large pieces they are fitted to the 2 wounds.  VAC dressing is  then applied.  The [facility] machine is then hooked up and the seal is in the green.  Patient tolerated procedure well no immediate complications and he is fit for discharge.  COMPLICATIONS: None  CONDITION: [name]  Office: [phone]   06/28/2022, 5:02 PM

## 2022-06-28 NOTE — Assessment & Plan Note (Signed)
Hold metoprolol.  Blood pressure is controlled for now   

## 2022-06-28 NOTE — Assessment & Plan Note (Signed)
Cpap per home setting for his OSA. 

## 2022-06-28 NOTE — Assessment & Plan Note (Signed)
Lab Results  Component Value Date   CREATININE 0.75 06/28/2022   CREATININE 0.96 06/27/2022   CREATININE 1.19 06/26/2022  Also has hyperkalemia -s/p 10 g of Lokelma Holding diuretics due to worsening of kidney function.  Improved with IV hydration

## 2022-06-28 NOTE — Anesthesia Preprocedure Evaluation (Signed)
Anesthesia Evaluation  Patient identified by MRN, date of birth, ID band Patient awake    Reviewed: Allergy & Precautions, NPO status , Patient's Chart, lab work & pertinent test results  History of Anesthesia Complications (+) DIFFICULT AIRWAY and history of anesthetic complications  Airway Mallampati: III  TM Distance: >3 FB Neck ROM: full    Dental  (+) Chipped, Poor Dentition, Missing   Pulmonary neg shortness of breath, sleep apnea , COPD, former smoker   Pulmonary exam normal        Cardiovascular Exercise Tolerance: Good hypertension, (-) angina +CHF  (-) Past MI Normal cardiovascular exam     Neuro/Psych  Neuromuscular disease CVA, Residual Symptoms  negative psych ROS   GI/Hepatic negative GI ROS, Neg liver ROS,,,  Endo/Other  Hypothyroidism    Renal/GU Renal disease  negative genitourinary   Musculoskeletal   Abdominal   Peds  Hematology negative hematology ROS (+)   Anesthesia Other Findings Past Medical History: No date: A-fib (HCC) No date: Cellulitis No date: CHF (congestive heart failure) (HCC) No date: COPD (chronic obstructive pulmonary disease) (HCC) No date: Difficult intubation No date: Hyperlipidemia No date: Hypertension No date: Hypothyroidism No date: Kidney disease No date: Sleep apnea No date: Stroke [facility]) No date: Thyroid disease  Past Surgical History: 06/24/2022: APPLICATION OF WOUND VAC; Left     Comment:  Procedure: APPLICATION OF WOUND VAC;  Surgeon: Renford Dills, MD;  Location: [facility] ORS;  Service: Vascular;                Laterality: Left; No date: CARDIAC CATHETERIZATION No date: CATARACT EXTRACTION W/ INTRAOCULAR LENS  IMPLANT, BILATERAL 2008: COLONOSCOPY 06/24/2022: HEMATOMA EVACUATION; Left     Comment:  Procedure: EVACUATION HEMATOMA;  Surgeon: Renford Dills, MD;  Location: [facility] ORS;  Service: Vascular;                 Laterality: Left;  wound vac No date: HERNIA REPAIR     Comment:  bilateral inguinal hernia/ [place] 02/02/2015: HERNIA REPAIR     Comment:  18 x 28 cm ventral light mesh placed laparoscopically. 06/24/2022: INCISION AND DRAINAGE; Left     Comment:  Procedure: INCISION AND DRAINAGE;  Surgeon: Renford Dills, MD;  Location: [facility] ORS;  Service: Vascular;                Laterality: Left; No date: TONSILLECTOMY 02/02/2015: UMBILICAL HERNIA REPAIR; N/A     Comment:  Procedure: HERNIA REPAIR UMBILICAL ADULT;  Surgeon:               Earline Mayotte, MD;  Location: [facility] ORS;  Service:               General;  Laterality: N/A; 02/02/2015: VENTRAL HERNIA REPAIR; N/A     Comment:  Procedure: LAPAROSCOPIC VENTRAL HERNIA;  Surgeon:               Earline Mayotte, MD;  Location: [facility] ORS;  Service:               General;  Laterality: N/A; 1979: vp shunt placement  BMI    Body Mass Index: 37.82 kg/m      Reproductive/Obstetrics negative OB ROS  Anesthesia Physical Anesthesia Plan  ASA: 3  Anesthesia Plan: General   Post-op Pain Management:    Induction: Intravenous  PONV Risk Score and Plan: Propofol infusion and TIVA  Airway Management Planned: Natural Airway and Nasal Cannula  Additional Equipment:   Intra-op Plan:   Post-operative Plan:   Informed Consent: I have reviewed the patients History and Physical, chart, labs and discussed the procedure including the risks, benefits and alternatives for the proposed anesthesia with the patient or authorized representative who has indicated his/her understanding and acceptance.     Dental Advisory Given  Plan Discussed with: Anesthesiologist, CRNA and Surgeon  Anesthesia Plan Comments: (Patient and significant other consented for risks of anesthesia including but not limited to:  - adverse reactions to medications - risk of airway placement if required -  damage to eyes, teeth, lips or other oral mucosa - nerve damage due to positioning  - sore throat or hoarseness - Damage to heart, brain, nerves, lungs, other parts of body or loss of life  They voiced understanding.)       Anesthesia Quick Evaluation

## 2022-06-28 NOTE — TOC Progression Note (Signed)
Transition of Care (TOC) - Progression Note    Patient Details  Name: Daniel Eaton. MRN: [id number] Date of Birth: September 24, 1940  Transition of Care [facility]) CM/SW Contact  Marlowe Sax, RN Phone Number: 06/28/2022, 12:53 PM  Clinical Narrative:    [facility] reports that they have the wound vac for the patient, he will go to room 222   Expected Discharge Plan: Skilled Nursing Facility Barriers to Discharge: Continued Medical Work up  Expected Discharge Plan and Services       Living arrangements for the past 2 months: Skilled Nursing Facility                                       Social Determinants of Health (SDOH) Interventions SDOH Screenings   Food Insecurity: No Food Insecurity (06/23/2022)  Housing: Low Risk  (06/23/2022)  Transportation Needs: No Transportation Needs (06/23/2022)  Utilities: Not At Risk (06/23/2022)  Depression (PHQ2-9): Low Risk  (06/06/2022)  Tobacco Use: Medium Risk (06/25/2022)    Readmission Risk Interventions     No data to display

## 2022-06-28 NOTE — Assessment & Plan Note (Signed)
Resolved with hydration.  Likely due to hypotension.  

## 2022-06-28 NOTE — Assessment & Plan Note (Signed)
Rate is controlled for now.  Unable to give Eliquis due to large hematoma and anemia.  

## 2022-06-28 NOTE — Plan of Care (Signed)
  Problem: Education: Goal: Knowledge of General Education information will improve Description: Including pain rating scale, medication(s)/side effects and non-pharmacologic comfort measures Outcome: Progressing   Problem: Clinical Measurements: Goal: Ability to maintain clinical measurements within normal limits will improve Outcome: Progressing   

## 2022-06-28 NOTE — TOC Progression Note (Signed)
Transition of Care (TOC) - Progression Note    Patient Details  Name: Daniel Eaton. MRN: [id number] Date of Birth: [birth year]  Transition of Care [facility]) CM/SW Contact  Marlowe Sax, RN Phone Number: 06/28/2022, 1:45 PM  Clinical Narrative:    [facility] to see the patient   Expected Discharge Plan: Skilled Nursing Facility Barriers to Discharge: Continued Medical Work up  Expected Discharge Plan and Services       Living arrangements for the past 2 months: Skilled Nursing Facility                                       Social Determinants of Health (SDOH) Interventions SDOH Screenings   Food Insecurity: No Food Insecurity (06/23/2022)  Housing: Low Risk  (06/23/2022)  Transportation Needs: No Transportation Needs (06/23/2022)  Utilities: Not At Risk (06/23/2022)  Depression (PHQ2-9): Low Risk  (06/06/2022)  Tobacco Use: Medium Risk (06/25/2022)    Readmission Risk Interventions     No data to display

## 2022-06-28 NOTE — Progress Notes (Signed)
Progress Note   Patient: Daniel Eaton. JXB:[id number] DOB: 1940/03/27 DOA: 06/22/2022     6 DOS: the patient was seen and examined on 06/28/2022   Brief hospital course: 82 year old male with a known history of A-fib on Eliquis, COPD, CHF, anemia is admitted for left lower extremity hematoma burst  5/9: Vascular surgery consult.  Plan for hematoma evacuation in the OR tomorrow 5/10: Drainage of hematoma, right IJ triple-lumen catheter and application of wound VAC 5/11: Added Keflex for potential infected hematoma as he has leukocytosis 5/12: Wound culture growing Staph aureus and procidentia that lab could not confirm if it is MRSA or MSSA yet 5/13: Wound culture confirmed MRSA and providencia.  Switched antibiotic to oral Zyvox and Cipro based on sensitivities.  Wound VAC dressing change by vascular surgery today  Assessment and Plan: * Leg hematoma, left, initial encounter Hold Eliquis.  Status post drainage of hematoma with excisional debridement of necrotic skin/subcutaneous tissue of the left lower extremity, application of wound VAC, insertion of right IJ triple-lumen catheter by vascular surgery on 5/10.  Wound VAC change plan tomorrow Central line and Foley were removed on 5/11 Wound VAC dressing was changed by vascular surgery today in the OR Patient was scheduled to leave [facility] this evening but I was notified that the facility assistant nursing director declined admission at this hour    Lactic acidosis Resolved with hydration.  Likely due to hypotension.   Hypothyroid Cont levothyroxine.  OSA (obstructive sleep apnea) Cpap per home setting for his OSA.  Normocytic anemia Acute blood loss anemia likely due to hematoma in the leg.  Hemoglobin 8.3.  Will transfuse if it drops less than 7.  Hold Eliquis for now  Acute renal failure superimposed on stage 3a chronic kidney disease (HCC) Lab Results  Component Value Date   CREATININE 0.75 06/28/2022    CREATININE 0.96 06/27/2022   CREATININE 1.19 06/26/2022  Also has hyperkalemia -s/p 10 g of Lokelma Holding diuretics due to worsening of kidney function.  Improved with IV hydration    COPD (chronic obstructive pulmonary disease) (HCC) Stable.  On room air.  Essential hypertension Hold metoprolol.  Blood pressure is controlled for now    Atrial fibrillation (HCC) Rate is controlled for now.  Unable to give Eliquis due to large hematoma and anemia.         Subjective: No new issues.  He was agreeable to go back to his facility after wound VAC change.  Physical Exam: Vitals:   06/28/22 1721 06/28/22 1730 06/28/22 1733 06/28/22 1735  BP:  (!) 109/55  (!) 102/48  Pulse: 82 78 87 87  Resp: 18 15 14 17   Temp:  (!) 97 F (36.1 C)    TempSrc:      SpO2: 94% 97% 98% 97%  Weight:      Height:       82 year old frail looking male lying in the bed comfortably without any acute distress Lungs clear to auscultation bilaterally Heart irregularly irregular heart sounds Abdomen soft, benign, obese Extremities/skin: Dressing in place on left lower extremity with wound VAC Neuro awake and alert, nonfocal Psych normal mood and affect Data Reviewed:  Hemoglobin 8.1  Family Communication: Patient/significant other/Patricia was updated at bedside  Disposition: Status is: Inpatient Remains inpatient appropriate because: Management of infected hematoma  Planned Discharge Destination: Skilled nursing facility and Rehab   DVT prophylaxis-SCDs Time spent: 35 minutes  Author: Delfino Lovett, MD 06/28/2022 6:42 PM  For on call  review [email or web address].

## 2022-06-28 NOTE — Assessment & Plan Note (Signed)
Acute blood loss anemia likely due to hematoma in the leg.  Hemoglobin 8.3.  Will transfuse if it drops less than 7.  Hold Eliquis for now

## 2022-06-28 NOTE — Transfer of Care (Signed)
Immediate Anesthesia Transfer of Care Note  Patient: Daniel Eaton.  Procedure(s) Performed: INCISION AND DRAINAGE ABSCESS (Left)  Patient Location: PACU  Anesthesia Type:General  Level of Consciousness: awake, drowsy, and patient cooperative  Airway & Oxygen Therapy: Patient Spontanous Breathing and Patient connected to face mask oxygen  Post-op Assessment: Report given to RN and Post -op Vital signs reviewed and stable  Post vital signs: Reviewed and stable  Last Vitals:  Vitals Value Taken Time  BP    Temp    Pulse 76 06/28/22 1648  Resp 14 06/28/22 1648  SpO2 100 % 06/28/22 1648  Vitals shown include unvalidated device data.  Last Pain:  Vitals:   06/28/22 1442  TempSrc: Temporal  PainSc: 10-Worst pain ever      Patients Stated Pain Goal: 3 (06/25/22 1020)  Complications: No notable events documented.

## 2022-06-28 NOTE — Assessment & Plan Note (Signed)
Hold Eliquis.  Status post drainage of hematoma with excisional debridement of necrotic skin/subcutaneous tissue of the left lower extremity, application of wound VAC, insertion of right IJ triple-lumen catheter by vascular surgery on 5/10.  Wound VAC change plan tomorrow Central line and Foley were removed on 5/11 Wound VAC dressing was changed by vascular surgery today in the OR Patient was scheduled to leave [facility] this evening but I was notified that the facility assistant nursing director declined admission at this hour

## 2022-06-28 NOTE — Plan of Care (Signed)
  Problem: Health Behavior/Discharge Planning: Goal: Ability to manage health-related needs will improve Outcome: Progressing   Problem: Clinical Measurements: Goal: Diagnostic test results will improve Outcome: Progressing   Problem: Nutrition: Goal: Adequate nutrition will be maintained Outcome: Progressing   Problem: Coping: Goal: Level of anxiety will decrease Outcome: Progressing   Problem: Pain Managment: Goal: General experience of comfort will improve Outcome: Progressing   

## 2022-06-28 NOTE — Progress Notes (Addendum)
Daily Progress Note   Patient Name: Daniel Eaton.       Date: 06/28/2022 DOB: Mar 05, 1940  Age: 82 y.o. MRN#: [id number] Attending Physician: Delfino Lovett, MD Primary Care Physician: Lauro Regulus, MD Admit Date: 06/22/2022  Reason for Consultation/Follow-up: Establishing goals of care  Subjective: Notes and labs reviewed.  He is resting in bed with lights and TV off, listening to music on his phone at this time, no family at bedside.  He states he understands he is not able to eat or drink at this time due to going to the OR.   We discussed his GOC. Discussion was had today regarding advanced directives.  Values and goals of care important to patient and family were attempted to be elicited.  Discussed limitations of medical interventions to prolong quality of life in some situations and discussed the concept of human mortality.  Patient advises that he he he has 2 biological children, and 2 children that he legally adopted.  Discussed decision making if it is unable to make decisions for himself.  He states he is considering having healthcare power of attorney forms completed for his significant other Daniel Eaton and his biological son to be his Runner, broadcasting/film/video.  He is aware chaplain can be involved to help facilitate advance directive completion.  In regards to goals of care, he states he does not believe that he would want resuscitation if his heart and breathing were to stop.  He states he would want to be put on a ventilator, but only short-term.  Discussed changing CODE STATUS to DNR, and he states he will need to consider this further and speak with Daniel Eaton about this prior to making any changes.     Length of Stay: 6  Current Medications: Scheduled Meds:    amiodarone  200 mg Oral Daily   chlorhexidine  60 mL Topical Once   Chlorhexidine Gluconate Cloth  6 each Topical Daily   ciprofloxacin  500 mg Oral BID   fluticasone furoate-vilanterol  1 puff Inhalation Daily   And   umeclidinium bromide  1 puff Inhalation Daily   levothyroxine  88 mcg Oral Daily   linezolid  600 mg Oral Q12H   loratadine  10 mg Oral Daily   multivitamin with minerals  1 tablet Oral Daily  nutrition supplement (JUVEN)  1 packet Oral BID BM   polyethylene glycol  17 g Oral Daily   senna-docusate  2 tablet Oral BID   sodium chloride flush  3 mL Intravenous Q12H    Continuous Infusions:   PRN Meds: acetaminophen **OR** acetaminophen, albuterol, HYDROcodone-acetaminophen, ondansetron (ZOFRAN) IV, oxyCODONE-acetaminophen  Physical Exam Pulmonary:     Effort: Pulmonary effort is normal.  Skin:    General: Skin is warm and dry.  Neurological:     Mental Status: He is alert.             Vital Signs: BP (!) 141/69 (BP Location: Right Arm)   Pulse 88   Temp 99.2 F (37.3 C) (Oral)   Resp 18   Ht 5\' 11"  (1.803 m)   Wt 123 kg   SpO2 91%   BMI 37.82 kg/m  SpO2: SpO2: 91 % O2 Device: O2 Device: Room Air O2 Flow Rate: O2 Flow Rate (L/min): 2 L/min  Intake/output summary:  Intake/Output Summary (Last 24 hours) at 06/28/2022 1106 Last data filed at 06/28/2022 0548 Gross per 24 hour  Intake --  Output 1200 ml  Net -1200 ml   LBM: Last BM Date : 06/23/22 Baseline Weight: Weight: 113.3 kg Most recent weight: Weight: 123 kg   Patient Active Problem List   Diagnosis Date Noted   Leg hematoma, left, initial encounter 06/22/2022   Lactic acidosis 06/22/2022   Macrocytic anemia 06/06/2022   Cellulitis of left lower extremity 02/14/2021   COPD (chronic obstructive pulmonary disease) (HCC)    Stroke (HCC)    Acute renal failure superimposed on stage 3a chronic kidney disease (HCC)    Elevated troponin    Normocytic anemia    Generalized weakness    Acute  renal failure superimposed on chronic kidney disease (HCC)    Myalgia due to statin 12/18/2019   Atrial fibrillation (HCC) 04/09/2019   Essential hypertension 04/09/2019   Venous stasis dermatitis of both lower extremities 04/09/2019   Lower limb ulcer, ankle, right, limited to breakdown of skin (HCC) 04/09/2019   Rhabdomyolysis 12/10/2018   Impairment of balance 10/02/2018   Physical deconditioning 10/02/2018   Sepsis (HCC) 07/28/2018   Primary osteoarthritis of right knee 09/22/2017   Healthcare maintenance 03/16/2017   Aortic atherosclerosis (HCC) 03/15/2016   Cough 03/20/2015   Bronchitis, chronic obstructive w acute bronchitis (HCC) 03/20/2015   Umbilical hernia without obstruction and without gangrene 01/12/2015   Ventral hernia without obstruction or gangrene 01/02/2015   Morbid obesity with BMI of 45.0-49.9, adult (HCC) 11/23/2013   Achalasia 06/30/2013   Cardiomyopathy (HCC) 06/30/2013   OSA (obstructive sleep apnea) 06/30/2013   Hypothyroid 06/30/2013    Palliative Care Assessment & Plan   Recommendations/Plan: PMT will follow Patient is considering changing to DNR status.  States he would not want long-term ventilator support.  Advises he would like to consider this further and talk with his significant other prior to making any changes. Code Status:    Code Status Orders  (From admission, onward)           Start     Ordered   06/22/22 2125  Full code  Continuous       Question:  By:  Answer:  Other   06/22/22 2127           Code Status History     Date Active Date Inactive Code Status Order ID Comments User Context   02/14/2021 0851 02/18/2021 1630 Full Code [id number]  Clyde Lundborg,  Brien Few, MD ED   12/10/2018 2238 12/14/2018 2230 Full Code [id number]  Houston Siren, MD Inpatient   07/31/2018 1149 08/03/2018 1609 Full Code [id number]  Jama Flavors, MD Inpatient   07/28/2018 1109 07/31/2018 1149 DNR [id number]  Jama Flavors, MD ED      Advance Directive Documentation     Flowsheet Row Most Recent Value  Type of Advance Directive Out of facility DNR (pink MOST or yellow form)  Pre-existing out of facility DNR order (yellow form or pink MOST form) Pink MOST form placed in chart (order not valid for inpatient use)  "MOST" Form in Place? --       Prognosis:  Unable to determine    Thank you for allowing the Palliative Medicine Team to assist in the care of this patient.     Morton Stall, NP  Please contact Palliative Medicine Team phone at [phone] for questions and concerns.

## 2022-06-28 NOTE — Discharge Summary (Signed)
Physician Discharge Summary   Patient: Daniel Eaton. MRN: [id number] DOB: 14-Feb-1941  Admit date:     06/22/2022  Discharge date: 06/28/22  Discharge Physician: Delfino Lovett   PCP: Lauro Regulus, MD   Recommendations at discharge:   Follow-up with his outpatient providers as requested Wound VAC instructions per vascular surgery.  To be changed once at the facility  Discharge Diagnoses: Principal Problem:   Leg hematoma, left, initial encounter Active Problems:   Atrial fibrillation (HCC)   Essential hypertension   COPD (chronic obstructive pulmonary disease) (HCC)   Acute renal failure superimposed on stage 3a chronic kidney disease (HCC)   Normocytic anemia   OSA (obstructive sleep apnea)   Hypothyroid   Lactic acidosis  Hospital Course: 82 year old male with a known history of A-fib on Eliquis, COPD, CHF, anemia is admitted for left lower extremity hematoma burst  5/9: Vascular surgery consult.  Plan for hematoma evacuation in the OR tomorrow 5/10: Drainage of hematoma, right IJ triple-lumen catheter and application of wound VAC 5/11: Added Keflex for potential infected hematoma as he has leukocytosis 5/12: Wound culture growing Staph aureus and procidentia that lab could not confirm if it is MRSA or MSSA yet 5/13: Wound culture growing MRSA and providencia sensitive to Cipro and Zyvox.  So antibiotic switched to the same for 10 more days  Assessment and Plan: * Leg hematoma, left, initial encounter Hold Eliquis for 2 more weeks and resume on 07/12/2022.  Status post drainage of hematoma with excisional debridement of necrotic skin/subcutaneous tissue of the left lower extremity, application of wound VAC, insertion of right IJ triple-lumen catheter by vascular surgery on 5/10.  Wound VAC change today per vascular surgery Central line and Foley were removed on 5/11  Lactic acidosis Resolved with hydration.  Likely due to hypotension.  Hypothyroid Cont  levothyroxine.  OSA (obstructive sleep apnea) Cpap per home setting for his OSA  Normocytic anemia Acute blood loss anemia likely due to hematoma in the leg.  Hemoglobin 8.1.  Hold Eliquis for next 2 weeks considering a large hematoma.  If no further bleeding this can be resumed on 07/12/2022  Acute renal failure superimposed on stage 3a chronic kidney disease [facility]) Lab Results  Component Value Date   CREATININE 0.96 06/27/2022   CREATININE 1.19 06/26/2022   CREATININE 1.49 (H) 06/25/2022  Also has hyperkalemia -s/p 10 g of Lokelma Resolved with hydration  COPD (chronic obstructive pulmonary disease) (HCC) Stable.  On room air.  Essential hypertension Continue metoprolol  Atrial fibrillation (HCC) Rate is controlled with metoprolol. unable to give Eliquis due to large hematoma and anemia..  Holding Eliquis till 07/11/2022.  This can be resumed if no further bleeding on 07/12/2022 Patient and family understand the risk of holding blood thinner/Eliquis including possible stroke.  His risk of bleeding outweighs stroke risk at this time          Consultants: Vascular surgery, palliative care Procedures performed: Drainage of hematoma with excisional debridement of necrotic skin and necrotic subcutaneous tissue of the left lower extremity, application of wound VAC on 5/10.  Wound VAC changed on 06/28/2022 Disposition: Long term care facility Diet recommendation:  Carb modified diet DISCHARGE MEDICATION: Allergies as of 06/28/2022       Reactions   Diltiazem Hcl Itching, Other (See Comments)   edema        Medication List     STOP taking these medications    ammonium lactate 12 % lotion Commonly known as: LAC-HYDRIN  DECUBI-VITE PO   guaiFENesin 600 MG 12 hr tablet Commonly known as: MUCINEX   hydrOXYzine 25 MG tablet Commonly known as: ATARAX   metolazone 2.5 MG tablet Commonly known as: ZAROXOLYN   OMEGA 3 PO   predniSONE 20 MG tablet Commonly known as:  DELTASONE       TAKE these medications    acetaminophen 500 MG tablet Commonly known as: TYLENOL Take 500 mg by mouth every 12 (twelve) hours.   acidophilus Caps capsule Take 1 capsule by mouth 2 (two) times daily.   albuterol 108 (90 Base) MCG/ACT inhaler Commonly known as: VENTOLIN HFA Inhale 2 puffs into the lungs every 6 (six) hours as needed for wheezing or shortness of breath. What changed: Another medication with the same name was removed. Continue taking this medication, and follow the directions you see here.   amiodarone 200 MG tablet Commonly known as: PACERONE Take 200 mg by mouth daily.   apixaban 5 MG Tabs tablet Commonly known as: ELIQUIS Take 1 tablet (5 mg total) by mouth 2 (two) times daily. Start taking on: Jul 12, 2022 What changed: These instructions start on Jul 12, 2022. If you are unsure what to do until then, ask your doctor or other care provider.   ASPERCREME PAIN RELIEF PATCH EX Apply topically every 12 (twelve) hours.   ciprofloxacin 500 MG tablet Commonly known as: CIPRO Take 1 tablet (500 mg total) by mouth 2 (two) times daily for 10 days.   ferrous sulfate 325 (65 FE) MG tablet Take 1 tablet (325 mg total) by mouth daily.   HYDROcodone-acetaminophen 5-325 MG tablet Commonly known as: NORCO/VICODIN Take 1 tablet by mouth every 4 (four) hours as needed for up to 3 days for moderate pain or severe pain. What changed:  when to take this reasons to take this   levothyroxine 88 MCG tablet Commonly known as: SYNTHROID Take 88 mcg by mouth daily.   linezolid 600 MG tablet Commonly known as: ZYVOX Take 1 tablet (600 mg total) by mouth every 12 (twelve) hours for 10 days.   loratadine 10 MG tablet Commonly known as: CLARITIN Take 10 mg by mouth daily.   metoprolol succinate 50 MG 24 hr tablet Commonly known as: TOPROL-XL Take 50 mg by mouth daily. Take with or immediately following a meal.   Multi-Vitamins Tabs Take 1 tablet by  mouth daily.   omeprazole 20 MG capsule Commonly known as: PRILOSEC Take 20 mg by mouth daily.   polyethylene glycol 17 g packet Commonly known as: MIRALAX / GLYCOLAX Take 17 g by mouth daily as needed. What changed: reasons to take this   potassium chloride SA 20 MEQ tablet Commonly known as: KLOR-CON M Take 20 mEq by mouth daily.   Refresh Celluvisc 1 % Gel Generic drug: Carboxymethylcellulose Sod PF Apply 1 drop to eye every 12 (twelve) hours. Right eye   senna-docusate 8.6-50 MG tablet Commonly known as: Senokot-S Take 2 tablets by mouth 2 (two) times daily.   sertraline 50 MG tablet Commonly known as: ZOLOFT Take 50 mg by mouth daily.   spironolactone 25 MG tablet Commonly known as: ALDACTONE Take 25 mg by mouth daily.   torsemide 20 MG tablet Commonly known as: DEMADEX Hold pending outpatient followup due to acute kidney injury. What changed:  how much to take how to take this when to take this additional instructions   traZODone 50 MG tablet Commonly known as: DESYREL Take 25 mg by mouth at bedtime.   Trelegy  Ellipta 100-62.5-25 MCG/ACT Aepb Generic drug: Fluticasone-Umeclidin-Vilant Inhale 1 puff into the lungs daily.               Discharge Care Instructions  (From admission, onward)           Start     Ordered   06/28/22 0000  Discharge wound care:       Comments: Apply wound vac and follow instructions per vascular surgery   06/28/22 1340            Discharge Exam: Filed Weights   06/24/22 0500 06/25/22 0500  Weight: 113.3 kg 65 kg   82 year old frail looking male lying in the bed comfortably without any acute distress Lungs clear to auscultation bilaterally Heart irregularly irregular heart sounds Abdomen soft, benign, obese Extremities/skin: Dressing in place on left lower extremity with wound VAC Neuro awake and alert, nonfocal Psych normal mood and affect  Condition at discharge: fair  The results of significant  diagnostics from this hospitalization (including imaging, microbiology, ancillary and laboratory) are listed below for reference.   Imaging Studies: [place] OR NERVE BLOCK-IMAGE ONLY [facility])  Result Date: 06/24/2022 There is no interpretation for this exam.  This order is for images obtained during a surgical procedure.  Please See "Surgeries" Tab for more information regarding the procedure.   [place] OR NERVE BLOCK-IMAGE ONLY [facility])  Result Date: 06/24/2022 There is no interpretation for this exam.  This order is for images obtained during a surgical procedure.  Please See "Surgeries" Tab for more information regarding the procedure.   CT FEMUR RIGHT WO CONTRAST  Result Date: 06/22/2022 CLINICAL DATA:  Lower leg trauma with swelling from mid thigh to calf with hematoma and worsening anemia. EXAM: CT OF THE LOWER BILATERAL EXTREMITY WITHOUT CONTRAST TECHNIQUE: Multidetector CT imaging of the right lower extremity was performed according to the standard protocol. RADIATION DOSE REDUCTION: This exam was performed according to the departmental dose-optimization program which includes automated exposure control, adjustment of the mA and/or kV according to patient size and/or use of iterative reconstruction technique. COMPARISON:  CT 05/12/2022 FINDINGS: LEFT: Bones/Joint/Cartilage Demineralization. No acute fracture in the left femur, tibia, or fibula. No dislocation of the hip or knee. No knee joint effusion. Degenerative changes left hip and left knee. Ligaments Suboptimally assessed by CT. Muscles and Tendons Fatty atrophy of the musculature of the thigh and lower leg. Soft tissues Diffuse edema through the soft tissues of the left lower extremity extending from the level of the inferior along the lateral and medial thigh answer from frontally about the knee and calf. RIGHT: Bones/Joint/Cartilage Demineralization. No acute fracture in the right femur, tibia, or fibula. No dislocation of the hip or knee. No knee  joint effusion. Degenerative changes right hip and right knee. Soft tissue wound about the anterolateral proximal calf with overlying dressing material. Dressing material extends into the subcutaneous fat in the area of a gas and fluid collection which measures 5.4 x 2.0 cm in the axial plane and extends inferiorly approximately 9.1 cm. Posterior to the gas and fluid collection there is a heterogenous intermediate density collection which extends from the posterolateral distal thigh into the posterolateral proximal calf. This measures 10.1 x 3.6 cm in the axial plane and encompasses a craniocaudal dimension of 25.9 cm. Ligaments Suboptimally assessed by CT. Muscles and Tendons Fatty atrophy of the musculature of the thigh and lower leg. Soft tissues Diffuse edema through the soft tissues of the right lower extremity extending from the level of  hip inferiorly along the lateral and medial thigh and circumferentially throughout the knee and calf. This is not substantially changed from 05/12/2022. Heterogenous loculated collection along the medial tibia proximally the collection measures 8.9 x 3.6 in the axial plane, previously 10.0 x 4.2 cm using similar measuring technique. This extends for a length of approximately 11.0 cm, previously 13.1 cm. The collection again demonstrates heterogenous intermediate density without definite wall thickening. Given slight decrease in size and heterogenous intermediate density this is favored to represent a hematoma. IMPRESSION: RIGHT: 1. Slightly decreased size of the heterogenous intermediate density fluid collection in the medial/proximal calf favored to represent a hematoma. Abscess or soft tissue mass are differential considerations but considered less likely. 2. Unchanged diffuse soft tissue edema throughout the right leg. LEFT: 1. Soft tissue wound about the lateral proximal calf with overlying dressing material extend into a 5.4 x 2.0 x 9.1 cm gas and fluid collection.  Differential considerations include abscess or hematoma with or without superimposed infection. 2. Additional large 10.1 x 3.6 x 25.9 cm intermediate density fluid collection in the posterolateral left leg centered about the knee consistent with hematoma. Differential considerations are soft tissue mass or abscess but these are considered less likely. 3. Diffuse soft tissue edema throughout the right leg. Electronically Signed   By: Minerva Fester M.D.   On: 06/22/2022 18:05   CT TIBIA FIBULA RIGHT WO CONTRAST  Result Date: 06/22/2022 CLINICAL DATA:  Lower leg trauma with swelling from mid thigh to calf with hematoma and worsening anemia. EXAM: CT OF THE LOWER BILATERAL EXTREMITY WITHOUT CONTRAST TECHNIQUE: Multidetector CT imaging of the right lower extremity was performed according to the standard protocol. RADIATION DOSE REDUCTION: This exam was performed according to the departmental dose-optimization program which includes automated exposure control, adjustment of the mA and/or kV according to patient size and/or use of iterative reconstruction technique. COMPARISON:  CT 05/12/2022 FINDINGS: LEFT: Bones/Joint/Cartilage Demineralization. No acute fracture in the left femur, tibia, or fibula. No dislocation of the hip or knee. No knee joint effusion. Degenerative changes left hip and left knee. Ligaments Suboptimally assessed by CT. Muscles and Tendons Fatty atrophy of the musculature of the thigh and lower leg. Soft tissues Diffuse edema through the soft tissues of the left lower extremity extending from the level of the inferior along the lateral and medial thigh answer from frontally about the knee and calf. RIGHT: Bones/Joint/Cartilage Demineralization. No acute fracture in the right femur, tibia, or fibula. No dislocation of the hip or knee. No knee joint effusion. Degenerative changes right hip and right knee. Soft tissue wound about the anterolateral proximal calf with overlying dressing material.  Dressing material extends into the subcutaneous fat in the area of a gas and fluid collection which measures 5.4 x 2.0 cm in the axial plane and extends inferiorly approximately 9.1 cm. Posterior to the gas and fluid collection there is a heterogenous intermediate density collection which extends from the posterolateral distal thigh into the posterolateral proximal calf. This measures 10.1 x 3.6 cm in the axial plane and encompasses a craniocaudal dimension of 25.9 cm. Ligaments Suboptimally assessed by CT. Muscles and Tendons Fatty atrophy of the musculature of the thigh and lower leg. Soft tissues Diffuse edema through the soft tissues of the right lower extremity extending from the level of hip inferiorly along the lateral and medial thigh and circumferentially throughout the knee and calf. This is not substantially changed from 05/12/2022. Heterogenous loculated collection along the medial tibia proximally the collection  measures 8.9 x 3.6 in the axial plane, previously 10.0 x 4.2 cm using similar measuring technique. This extends for a length of approximately 11.0 cm, previously 13.1 cm. The collection again demonstrates heterogenous intermediate density without definite wall thickening. Given slight decrease in size and heterogenous intermediate density this is favored to represent a hematoma. IMPRESSION: RIGHT: 1. Slightly decreased size of the heterogenous intermediate density fluid collection in the medial/proximal calf favored to represent a hematoma. Abscess or soft tissue mass are differential considerations but considered less likely. 2. Unchanged diffuse soft tissue edema throughout the right leg. LEFT: 1. Soft tissue wound about the lateral proximal calf with overlying dressing material extend into a 5.4 x 2.0 x 9.1 cm gas and fluid collection. Differential considerations include abscess or hematoma with or without superimposed infection. 2. Additional large 10.1 x 3.6 x 25.9 cm intermediate density  fluid collection in the posterolateral left leg centered about the knee consistent with hematoma. Differential considerations are soft tissue mass or abscess but these are considered less likely. 3. Diffuse soft tissue edema throughout the right leg. Electronically Signed   By: Minerva Fester M.D.   On: 06/22/2022 18:05   CT Tibia Fibula Left Wo Contrast  Result Date: 06/22/2022 CLINICAL DATA:  Lower leg trauma with swelling from mid thigh to calf with hematoma and worsening anemia. EXAM: CT OF THE LOWER BILATERAL EXTREMITY WITHOUT CONTRAST TECHNIQUE: Multidetector CT imaging of the right lower extremity was performed according to the standard protocol. RADIATION DOSE REDUCTION: This exam was performed according to the departmental dose-optimization program which includes automated exposure control, adjustment of the mA and/or kV according to patient size and/or use of iterative reconstruction technique. COMPARISON:  CT 05/12/2022 FINDINGS: LEFT: Bones/Joint/Cartilage Demineralization. No acute fracture in the left femur, tibia, or fibula. No dislocation of the hip or knee. No knee joint effusion. Degenerative changes left hip and left knee. Ligaments Suboptimally assessed by CT. Muscles and Tendons Fatty atrophy of the musculature of the thigh and lower leg. Soft tissues Diffuse edema through the soft tissues of the left lower extremity extending from the level of the inferior along the lateral and medial thigh answer from frontally about the knee and calf. RIGHT: Bones/Joint/Cartilage Demineralization. No acute fracture in the right femur, tibia, or fibula. No dislocation of the hip or knee. No knee joint effusion. Degenerative changes right hip and right knee. Soft tissue wound about the anterolateral proximal calf with overlying dressing material. Dressing material extends into the subcutaneous fat in the area of a gas and fluid collection which measures 5.4 x 2.0 cm in the axial plane and extends inferiorly  approximately 9.1 cm. Posterior to the gas and fluid collection there is a heterogenous intermediate density collection which extends from the posterolateral distal thigh into the posterolateral proximal calf. This measures 10.1 x 3.6 cm in the axial plane and encompasses a craniocaudal dimension of 25.9 cm. Ligaments Suboptimally assessed by CT. Muscles and Tendons Fatty atrophy of the musculature of the thigh and lower leg. Soft tissues Diffuse edema through the soft tissues of the right lower extremity extending from the level of hip inferiorly along the lateral and medial thigh and circumferentially throughout the knee and calf. This is not substantially changed from 05/12/2022. Heterogenous loculated collection along the medial tibia proximally the collection measures 8.9 x 3.6 in the axial plane, previously 10.0 x 4.2 cm using similar measuring technique. This extends for a length of approximately 11.0 cm, previously 13.1 cm. The collection again  demonstrates heterogenous intermediate density without definite wall thickening. Given slight decrease in size and heterogenous intermediate density this is favored to represent a hematoma. IMPRESSION: RIGHT: 1. Slightly decreased size of the heterogenous intermediate density fluid collection in the medial/proximal calf favored to represent a hematoma. Abscess or soft tissue mass are differential considerations but considered less likely. 2. Unchanged diffuse soft tissue edema throughout the right leg. LEFT: 1. Soft tissue wound about the lateral proximal calf with overlying dressing material extend into a 5.4 x 2.0 x 9.1 cm gas and fluid collection. Differential considerations include abscess or hematoma with or without superimposed infection. 2. Additional large 10.1 x 3.6 x 25.9 cm intermediate density fluid collection in the posterolateral left leg centered about the knee consistent with hematoma. Differential considerations are soft tissue mass or abscess but  these are considered less likely. 3. Diffuse soft tissue edema throughout the right leg. Electronically Signed   By: Minerva Fester M.D.   On: 06/22/2022 18:05   CT FEMUR LEFT WO CONTRAST  Result Date: 06/22/2022 CLINICAL DATA:  Lower leg trauma with swelling from mid thigh to calf with hematoma and worsening anemia. EXAM: CT OF THE LOWER BILATERAL EXTREMITY WITHOUT CONTRAST TECHNIQUE: Multidetector CT imaging of the right lower extremity was performed according to the standard protocol. RADIATION DOSE REDUCTION: This exam was performed according to the departmental dose-optimization program which includes automated exposure control, adjustment of the mA and/or kV according to patient size and/or use of iterative reconstruction technique. COMPARISON:  CT 05/12/2022 FINDINGS: LEFT: Bones/Joint/Cartilage Demineralization. No acute fracture in the left femur, tibia, or fibula. No dislocation of the hip or knee. No knee joint effusion. Degenerative changes left hip and left knee. Ligaments Suboptimally assessed by CT. Muscles and Tendons Fatty atrophy of the musculature of the thigh and lower leg. Soft tissues Diffuse edema through the soft tissues of the left lower extremity extending from the level of the inferior along the lateral and medial thigh answer from frontally about the knee and calf. RIGHT: Bones/Joint/Cartilage Demineralization. No acute fracture in the right femur, tibia, or fibula. No dislocation of the hip or knee. No knee joint effusion. Degenerative changes right hip and right knee. Soft tissue wound about the anterolateral proximal calf with overlying dressing material. Dressing material extends into the subcutaneous fat in the area of a gas and fluid collection which measures 5.4 x 2.0 cm in the axial plane and extends inferiorly approximately 9.1 cm. Posterior to the gas and fluid collection there is a heterogenous intermediate density collection which extends from the posterolateral distal  thigh into the posterolateral proximal calf. This measures 10.1 x 3.6 cm in the axial plane and encompasses a craniocaudal dimension of 25.9 cm. Ligaments Suboptimally assessed by CT. Muscles and Tendons Fatty atrophy of the musculature of the thigh and lower leg. Soft tissues Diffuse edema through the soft tissues of the right lower extremity extending from the level of hip inferiorly along the lateral and medial thigh and circumferentially throughout the knee and calf. This is not substantially changed from 05/12/2022. Heterogenous loculated collection along the medial tibia proximally the collection measures 8.9 x 3.6 in the axial plane, previously 10.0 x 4.2 cm using similar measuring technique. This extends for a length of approximately 11.0 cm, previously 13.1 cm. The collection again demonstrates heterogenous intermediate density without definite wall thickening. Given slight decrease in size and heterogenous intermediate density this is favored to represent a hematoma. IMPRESSION: RIGHT: 1. Slightly decreased size of the heterogenous  intermediate density fluid collection in the medial/proximal calf favored to represent a hematoma. Abscess or soft tissue mass are differential considerations but considered less likely. 2. Unchanged diffuse soft tissue edema throughout the right leg. LEFT: 1. Soft tissue wound about the lateral proximal calf with overlying dressing material extend into a 5.4 x 2.0 x 9.1 cm gas and fluid collection. Differential considerations include abscess or hematoma with or without superimposed infection. 2. Additional large 10.1 x 3.6 x 25.9 cm intermediate density fluid collection in the posterolateral left leg centered about the knee consistent with hematoma. Differential considerations are soft tissue mass or abscess but these are considered less likely. 3. Diffuse soft tissue edema throughout the right leg. Electronically Signed   By: Minerva Fester M.D.   On: 06/22/2022 18:05     Microbiology: Results for orders placed or performed during the hospital encounter of 06/22/22  MRSA Next Gen by PCR, Nasal     Status: Abnormal   Collection Time: 06/23/22  8:30 PM   Specimen: Nasal Mucosa; Nasal Swab  Result Value Ref Range Status   MRSA by PCR Next Gen DETECTED (A) NOT DETECTED Final    Comment: RESULT CALLED TO, READ BACK BY AND VERIFIED WITH: WINIFRED SWABY @2224  ON 06/23/22 SKL (NOTE) The GeneXpert MRSA Assay (FDA approved for NASAL specimens only), is one component of a comprehensive MRSA colonization surveillance program. It is not intended to diagnose MRSA infection nor to guide or monitor treatment for MRSA infections. Test performance is not FDA approved in patients less than 66 years old. Performed at [facility], [address] [address] [place], [place] [address]   Aerobic/Anaerobic Culture w Gram Stain (surgical/deep wound)     Status: None (Preliminary result)   Collection Time: 06/24/22  3:02 PM   Specimen: Wound; Abscess  Result Value Ref Range Status   Specimen Description   Final    WOUND Performed at [facility], [address], [place], [place] [address]    Special Requests   Final    NONE Performed at [facility], [address] [place], [place] [address]    Gram Stain   Final    NO WBC SEEN NO ORGANISMS SEEN Performed at [facility], [address], [place], [place] [address]    Culture   Final    RARE METHICILLIN RESISTANT STAPHYLOCOCCUS AUREUS RARE PROVIDENCIA STUARTII NO ANAEROBES ISOLATED; CULTURE IN PROGRESS FOR 5 DAYS    Report Status PENDING  Incomplete   Organism ID, Bacteria PROVIDENCIA STUARTII  Final   Organism ID, Bacteria METHICILLIN RESISTANT STAPHYLOCOCCUS AUREUS  Final      Susceptibility   Methicillin resistant staphylococcus aureus - MIC*    CIPROFLOXACIN >=8 RESISTANT Resistant     ERYTHROMYCIN >=8 RESISTANT Resistant     GENTAMICIN <=0.5 SENSITIVE Sensitive     OXACILLIN >=4  RESISTANT Resistant     TETRACYCLINE >=16 RESISTANT Resistant     VANCOMYCIN 1 SENSITIVE Sensitive     TRIMETH/SULFA >=320 RESISTANT Resistant     CLINDAMYCIN <=0.25 SENSITIVE Sensitive     RIFAMPIN <=0.5 SENSITIVE Sensitive     Inducible Clindamycin NEGATIVE Sensitive     LINEZOLID 2 SENSITIVE Sensitive     * RARE METHICILLIN RESISTANT STAPHYLOCOCCUS AUREUS   Providencia stuartii - MIC*    AMPICILLIN >=32 RESISTANT Resistant     CEFEPIME <=0.12 SENSITIVE Sensitive     CEFTAZIDIME <=1 SENSITIVE Sensitive     CEFTRIAXONE <=0.25 SENSITIVE Sensitive  CIPROFLOXACIN <=0.25 SENSITIVE Sensitive     GENTAMICIN RESISTANT Resistant     IMIPENEM 1 SENSITIVE Sensitive     TRIMETH/SULFA <=20 SENSITIVE Sensitive     AMPICILLIN/SULBACTAM >=32 RESISTANT Resistant     PIP/TAZO <=4 SENSITIVE Sensitive     * RARE PROVIDENCIA STUARTII    Labs: CBC: Recent Labs  Lab 06/22/22 1415 06/22/22 2037 06/24/22 1853 06/25/22 1428 06/26/22 0504 06/27/22 0417 06/28/22 0531  WBC 16.9*   < > 14.5* 12.2* 12.1* 10.2 10.6*  NEUTROABS 13.9*  --   --   --   --   --   --   HGB 5.4*   < > 8.5* 8.2* 7.5* 8.3* 8.1*  HCT 17.1*   < > 26.1* 25.2* 24.2* 26.9* 26.5*  MCV 107.5*   < > 96.3 96.6 98.8 100.4* 100.8*  PLT 336   < > 186 227 218 237 257   < > = values in this interval not displayed.   Basic Metabolic Panel: Recent Labs  Lab 06/24/22 0411 06/25/22 1428 06/26/22 0504 06/27/22 0417 06/28/22 0531  NA 132* 131* 133* 134* 135  K 4.7 3.8 3.8 3.8 3.7  CL 100 100 102 101 103  CO2 23 23 24 26 27   GLUCOSE 94 153* 103* 76 90  BUN 41* 38* 39* 36* 26*  CREATININE 2.12* 1.49* 1.19 0.96 0.75  CALCIUM 7.9* 7.9* 8.0* 7.9* 7.9*   Liver Function Tests: Recent Labs  Lab 06/22/22 1415 06/23/22 0522  AST 28 25  ALT 14 13  ALKPHOS 49 52  BILITOT 1.0 1.2  PROT 5.3* 5.4*  ALBUMIN 2.8* 2.9*   CBG: No results for input(s): "GLUCAP" in the last 168 hours.  Discharge time spent: greater than 30  minutes.  Signed: Delfino Lovett, MD [facility] 06/28/2022

## 2022-06-28 NOTE — Consult Note (Signed)
[facility] Customer service manager [facility]) Accountable Care Organization (ACO) [facility] Liaison Note  06/28/2022  Daniel Eaton [birth year] [id number]  Location: [facility] RN Hospital Liaison screened the patient remotely at [facility].  Insurance: [employer]   Daniel Eaton. is a 82 y.o. male who is a Primary Care Patient of Lauro Regulus, MD [facility]). The patient was screened for  readmission hospitalization with noted high risk score for unplanned readmission risk with 1 IP/3 ED in 6 months.  The patient was assessed for potential [facility]) Care Management service needs for post hospital transition for care coordination. Review of patient's electronic medical record reveals patient was admitted with Hematoma. This pt is being managed by [facility] noted in [facility]).     [facility] Care Management/Population Health does not replace or interfere with any arrangements made by the Inpatient Transition of Care team.   For questions contact:   Elliot Cousin, RN, BSN [facility] Liaison [place]   [facility]  Population Health Office Hours MTWF 8:00 am to 6 pm off on Thursday [phone] mobile [phone] [Office toll free line][facility] Office Hours are M-F 8:30 - 5 pm 24 hour nurse advise line [phone] Conceirge  [email or web address]

## 2022-06-28 NOTE — Plan of Care (Signed)
  Problem: Education: Goal: Knowledge of General Education information will improve Description: Including pain rating scale, medication(s)/side effects and non-pharmacologic comfort measures Outcome: Progressing   Problem: Activity: Goal: Risk for activity intolerance will decrease Outcome: Progressing   Problem: Elimination: Goal: Will not experience complications related to bowel motility Outcome: Progressing   Problem: Safety: Goal: Ability to remain free from injury will improve Outcome: Progressing   Problem: Pain Managment: Goal: General experience of comfort will improve Outcome: Progressing

## 2022-06-28 NOTE — Anesthesia Postprocedure Evaluation (Signed)
Anesthesia Post Note  Patient: Daniel Eaton.  Procedure(s) Performed: APPLICATION OF WOUND VAC  Patient location during evaluation: PACU Anesthesia Type: General Level of consciousness: awake and alert Pain management: pain level controlled Vital Signs Assessment: post-procedure vital signs reviewed and stable Respiratory status: spontaneous breathing, nonlabored ventilation, respiratory function stable and patient connected to nasal cannula oxygen Cardiovascular status: blood pressure returned to baseline and stable Postop Assessment: no apparent nausea or vomiting Anesthetic complications: no   No notable events documented.   Last Vitals:  Vitals:   06/28/22 1733 06/28/22 1735  BP:  (!) 102/48  Pulse: 87 87  Resp: 14 17  Temp:    SpO2: 98% 97%    Last Pain:  Vitals:   06/28/22 1735  TempSrc:   PainSc: Asleep                 Cleda Mccreedy Kimiyah Blick

## 2022-06-28 NOTE — TOC Progression Note (Signed)
Transition of Care (TOC) - Progression Note    Patient Details  Name: Daniel Eaton. MRN: [id number] Date of Birth: 04/04/1940  Transition of Care [facility]) CM/SW Contact  Marlowe Sax, RN Phone Number: 06/28/2022, 1:30 PM  Clinical Narrative:   Called EMS to arrange transport to [facility] room 222 they will add to the transport   List for after 5 PM   Expected Discharge Plan: Skilled Nursing Facility Barriers to Discharge: Continued Medical Work up  Expected Discharge Plan and Services       Living arrangements for the past 2 months: Skilled Nursing Facility                                       Social Determinants of Health (SDOH) Interventions SDOH Screenings   Food Insecurity: No Food Insecurity (06/23/2022)  Housing: Low Risk  (06/23/2022)  Transportation Needs: No Transportation Needs (06/23/2022)  Utilities: Not At Risk (06/23/2022)  Depression (PHQ2-9): Low Risk  (06/06/2022)  Tobacco Use: Medium Risk (06/25/2022)    Readmission Risk Interventions     No data to display

## 2022-06-29 ENCOUNTER — Encounter: Payer: Self-pay | Admitting: Vascular Surgery

## 2022-06-29 DIAGNOSIS — Z66 Do not resuscitate: Secondary | ICD-10-CM | POA: Diagnosis not present

## 2022-06-29 DIAGNOSIS — Z515 Encounter for palliative care: Secondary | ICD-10-CM

## 2022-06-29 DIAGNOSIS — S8012XA Contusion of left lower leg, initial encounter: Secondary | ICD-10-CM | POA: Diagnosis not present

## 2022-06-29 DIAGNOSIS — I10 Essential (primary) hypertension: Secondary | ICD-10-CM | POA: Diagnosis not present

## 2022-06-29 DIAGNOSIS — Z7189 Other specified counseling: Secondary | ICD-10-CM | POA: Diagnosis not present

## 2022-06-29 DIAGNOSIS — I4891 Unspecified atrial fibrillation: Secondary | ICD-10-CM | POA: Diagnosis not present

## 2022-06-29 DIAGNOSIS — N179 Acute kidney failure, unspecified: Secondary | ICD-10-CM | POA: Diagnosis not present

## 2022-06-29 DIAGNOSIS — G4733 Obstructive sleep apnea (adult) (pediatric): Secondary | ICD-10-CM

## 2022-06-29 LAB — AEROBIC/ANAEROBIC CULTURE W GRAM STAIN (SURGICAL/DEEP WOUND): Gram Stain: NONE SEEN

## 2022-06-29 NOTE — TOC Progression Note (Addendum)
Transition of Care (TOC) - Progression Note    Patient Details  Name: Daniel Eaton. MRN: [id number] Date of Birth: [birth year]  Transition of Care [facility]) CM/SW Contact  Marlowe Sax, RN Phone Number: 06/29/2022, 10:23 AM  Clinical Narrative:   Norm Parcel the S/O And notified her that the patient will DC to [facility] room 224 with EMS transport EMS called and put on transport list    Expected Discharge Plan: Skilled Nursing Facility Barriers to Discharge: Continued Medical Work up  Expected Discharge Plan and Services       Living arrangements for the past 2 months: Skilled Nursing Facility Expected Discharge Date: 06/29/22                                     Social Determinants of Health (SDOH) Interventions SDOH Screenings   Food Insecurity: No Food Insecurity (06/23/2022)  Housing: Low Risk  (06/23/2022)  Transportation Needs: No Transportation Needs (06/23/2022)  Utilities: Not At Risk (06/23/2022)  Depression (PHQ2-9): Low Risk  (06/06/2022)  Tobacco Use: Medium Risk (06/29/2022)    Readmission Risk Interventions     No data to display

## 2022-06-29 NOTE — Discharge Summary (Signed)
Physician Discharge Summary   Patient: Daniel Eaton. MRN: [id number] DOB: 03-12-1940  Admit date:     06/22/2022  Discharge date: 06/29/22  Discharge Physician: Arnetha Courser   PCP: Lauro Regulus, MD   Recommendations at discharge:   Follow-up with his outpatient providers as requested Wound VAC instructions per vascular surgery.  To be changed once at the facility  Discharge Diagnoses: Principal Problem:   Leg hematoma, left, initial encounter Active Problems:   Atrial fibrillation (HCC)   Essential hypertension   COPD (chronic obstructive pulmonary disease) (HCC)   Acute renal failure superimposed on stage 3a chronic kidney disease (HCC)   Normocytic anemia   OSA (obstructive sleep apnea)   Hypothyroid   Lactic acidosis  Hospital Course: 82 year old male with a known history of A-fib on Eliquis, COPD, CHF, anemia is admitted for left lower extremity hematoma burst  5/9: Vascular surgery consult.  Plan for hematoma evacuation in the OR tomorrow 5/10: Drainage of hematoma, right IJ triple-lumen catheter and application of wound VAC 5/11: Added Keflex for potential infected hematoma as he has leukocytosis.  Central line and Foley was removed. 5/12: Wound culture growing Staph aureus and procidentia that lab could not confirm if it is MRSA or MSSA yet 5/13: Wound culture confirmed MRSA and providencia.  Switched antibiotic to oral Zyvox and Cipro based on sensitivities.  Wound VAC dressing change by vascular surgery today. 5/15: Patient was apparently discharged yesterday, unable to leave due to some nursing issues at facility.  Vitals remained stable. Patient is being discharged on Cipro and Zyvox for 10 more days based on sensitivity results.  Patient will continue on current management and need to have a close follow-up with his providers for further recommendations.  Assessment and Plan: * Leg hematoma, left, initial encounter Hold Eliquis for 2 more weeks and  resume on 07/12/2022.  Status post drainage of hematoma with excisional debridement of necrotic skin/subcutaneous tissue of the left lower extremity, application of wound VAC, insertion of right IJ triple-lumen catheter by vascular surgery on 5/10.  Wound VAC change today per vascular surgery Central line and Foley were removed on 5/11  Lactic acidosis Resolved with hydration.  Likely due to hypotension.  Hypothyroid Cont levothyroxine.  OSA (obstructive sleep apnea) Cpap per home setting for his OSA  Normocytic anemia Acute blood loss anemia likely due to hematoma in the leg.  Hemoglobin 8.1.  Hold Eliquis for next 2 weeks considering a large hematoma.  If no further bleeding this can be resumed on 07/12/2022  Acute renal failure superimposed on stage 3a chronic kidney disease [facility]) Lab Results  Component Value Date   CREATININE 0.96 06/27/2022   CREATININE 1.19 06/26/2022   CREATININE 1.49 (H) 06/25/2022  Also has hyperkalemia -s/p 10 g of Lokelma Resolved with hydration  COPD (chronic obstructive pulmonary disease) (HCC) Stable.  On room air.  Essential hypertension Continue metoprolol  Atrial fibrillation (HCC) Rate is controlled with metoprolol. unable to give Eliquis due to large hematoma and anemia..  Holding Eliquis till 07/11/2022.  This can be resumed if no further bleeding on 07/12/2022 Patient and family understand the risk of holding blood thinner/Eliquis including possible stroke.  His risk of bleeding outweighs stroke risk at this time      Consultants: Vascular surgery, palliative care Procedures performed: Drainage of hematoma with excisional debridement of necrotic skin and necrotic subcutaneous tissue of the left lower extremity, application of wound VAC on 5/10.  Wound VAC changed on 06/28/2022 Disposition:  Long term care facility Diet recommendation:  Carb modified diet DISCHARGE MEDICATION: Allergies as of 06/29/2022       Reactions   Diltiazem Hcl  Itching, Other (See Comments)   edema        Medication List     STOP taking these medications    ammonium lactate 12 % lotion Commonly known as: LAC-HYDRIN   DECUBI-VITE PO   guaiFENesin 600 MG 12 hr tablet Commonly known as: MUCINEX   hydrOXYzine 25 MG tablet Commonly known as: ATARAX   metolazone 2.5 MG tablet Commonly known as: ZAROXOLYN   OMEGA 3 PO   predniSONE 20 MG tablet Commonly known as: DELTASONE       TAKE these medications    acetaminophen 500 MG tablet Commonly known as: TYLENOL Take 500 mg by mouth every 12 (twelve) hours.   acidophilus Caps capsule Take 1 capsule by mouth 2 (two) times daily.   albuterol 108 (90 Base) MCG/ACT inhaler Commonly known as: VENTOLIN HFA Inhale 2 puffs into the lungs every 6 (six) hours as needed for wheezing or shortness of breath. What changed: Another medication with the same name was removed. Continue taking this medication, and follow the directions you see here.   amiodarone 200 MG tablet Commonly known as: PACERONE Take 200 mg by mouth daily.   apixaban 5 MG Tabs tablet Commonly known as: ELIQUIS Take 1 tablet (5 mg total) by mouth 2 (two) times daily. Start taking on: Jul 12, 2022 What changed: These instructions start on Jul 12, 2022. If you are unsure what to do until then, ask your doctor or other care provider.   ASPERCREME PAIN RELIEF PATCH EX Apply topically every 12 (twelve) hours.   ciprofloxacin 500 MG tablet Commonly known as: CIPRO Take 1 tablet (500 mg total) by mouth 2 (two) times daily for 10 days.   ferrous sulfate 325 (65 FE) MG tablet Take 1 tablet (325 mg total) by mouth daily.   HYDROcodone-acetaminophen 5-325 MG tablet Commonly known as: NORCO/VICODIN Take 1 tablet by mouth every 4 (four) hours as needed for up to 3 days for moderate pain or severe pain. What changed:  when to take this reasons to take this   levothyroxine 88 MCG tablet Commonly known as: SYNTHROID Take  88 mcg by mouth daily.   linezolid 600 MG tablet Commonly known as: ZYVOX Take 1 tablet (600 mg total) by mouth every 12 (twelve) hours for 10 days.   loratadine 10 MG tablet Commonly known as: CLARITIN Take 10 mg by mouth daily.   metoprolol succinate 50 MG 24 hr tablet Commonly known as: TOPROL-XL Take 50 mg by mouth daily. Take with or immediately following a meal.   Multi-Vitamins Tabs Take 1 tablet by mouth daily.   omeprazole 20 MG capsule Commonly known as: PRILOSEC Take 20 mg by mouth daily.   polyethylene glycol 17 g packet Commonly known as: MIRALAX / GLYCOLAX Take 17 g by mouth daily as needed. What changed: reasons to take this   potassium chloride SA 20 MEQ tablet Commonly known as: KLOR-CON M Take 20 mEq by mouth daily.   Refresh Celluvisc 1 % Gel Generic drug: Carboxymethylcellulose Sod PF Apply 1 drop to eye every 12 (twelve) hours. Right eye   senna-docusate 8.6-50 MG tablet Commonly known as: Senokot-S Take 2 tablets by mouth 2 (two) times daily.   sertraline 50 MG tablet Commonly known as: ZOLOFT Take 50 mg by mouth daily.   spironolactone 25 MG tablet Commonly  known as: ALDACTONE Take 25 mg by mouth daily.   torsemide 20 MG tablet Commonly known as: DEMADEX Hold pending outpatient followup due to acute kidney injury. What changed:  how much to take how to take this when to take this additional instructions   traZODone 50 MG tablet Commonly known as: DESYREL Take 25 mg by mouth at bedtime.   Trelegy Ellipta 100-62.5-25 MCG/ACT Aepb Generic drug: Fluticasone-Umeclidin-Vilant Inhale 1 puff into the lungs daily.               Discharge Care Instructions  (From admission, onward)           Start     Ordered   06/29/22 0000  Discharge wound care:       Comments: Patient is being discharged with wound VAC, please record prescriptions   06/29/22 1018   06/28/22 0000  Discharge wound care:       Comments: Apply wound vac  and follow instructions per vascular surgery   06/28/22 1340            Contact information for follow-up providers     Lauro Regulus, MD. Schedule an appointment as soon as possible for a visit in 3 day(s).   Specialty: Internal Medicine Why: [facility] Discharge F/UP Contact information: [address] [place] [address] [phone]         Georgiana Spinner, NP. Schedule an appointment as soon as possible for a visit in 2 week(s).   Specialty: Vascular Surgery Why: [facility] Discharge F/UP Contact information: [address] [place] [address] [phone]              Contact information for after-discharge care     Destination     [facility] .   Service: Skilled Nursing Contact information: [address] [place] [address] [phone]                    Discharge Exam: Ceasar Mons Weights   06/24/22 0500 06/25/22 0500  Weight: 113.3 kg 123 kg   General.  Frail elderly man, in no acute distress. Pulmonary.  Lungs clear bilaterally, normal respiratory effort. CV.  Regular rate and rhythm, no JVD, rub or murmur. Abdomen.  Soft, nontender, nondistended, BS positive. CNS.  Alert and oriented .  No focal neurologic deficit. Extremities.  No edema, no cyanosis, pulses intact and symmetrical.  Left lower extremity wound VAC in place, right lower extremity with clean bandage Psychiatry.  Judgment and insight appears normal.   Condition at discharge: fair  The results of significant diagnostics from this hospitalization (including imaging, microbiology, ancillary and laboratory) are listed below for reference.   Imaging Studies: [place] OR NERVE BLOCK-IMAGE ONLY [facility])  Result Date: 06/24/2022 There is no interpretation for this exam.  This order is for images obtained during a surgical procedure.  Please See "Surgeries" Tab for more information regarding the  procedure.   [place] OR NERVE BLOCK-IMAGE ONLY [facility])  Result Date: 06/24/2022 There is no interpretation for this exam.  This order is for images obtained during a surgical procedure.  Please See "Surgeries" Tab for more information regarding the procedure.   CT FEMUR RIGHT WO CONTRAST  Result Date: 06/22/2022 CLINICAL DATA:  Lower leg trauma with swelling from mid thigh to calf with hematoma and worsening anemia. EXAM: CT OF THE LOWER BILATERAL EXTREMITY WITHOUT CONTRAST TECHNIQUE: Multidetector CT imaging of the right lower extremity was  performed according to the standard protocol. RADIATION DOSE REDUCTION: This exam was performed according to the departmental dose-optimization program which includes automated exposure control, adjustment of the mA and/or kV according to patient size and/or use of iterative reconstruction technique. COMPARISON:  CT 05/12/2022 FINDINGS: LEFT: Bones/Joint/Cartilage Demineralization. No acute fracture in the left femur, tibia, or fibula. No dislocation of the hip or knee. No knee joint effusion. Degenerative changes left hip and left knee. Ligaments Suboptimally assessed by CT. Muscles and Tendons Fatty atrophy of the musculature of the thigh and lower leg. Soft tissues Diffuse edema through the soft tissues of the left lower extremity extending from the level of the inferior along the lateral and medial thigh answer from frontally about the knee and calf. RIGHT: Bones/Joint/Cartilage Demineralization. No acute fracture in the right femur, tibia, or fibula. No dislocation of the hip or knee. No knee joint effusion. Degenerative changes right hip and right knee. Soft tissue wound about the anterolateral proximal calf with overlying dressing material. Dressing material extends into the subcutaneous fat in the area of a gas and fluid collection which measures 5.4 x 2.0 cm in the axial plane and extends inferiorly approximately 9.1 cm. Posterior to the gas and fluid collection there  is a heterogenous intermediate density collection which extends from the posterolateral distal thigh into the posterolateral proximal calf. This measures 10.1 x 3.6 cm in the axial plane and encompasses a craniocaudal dimension of 25.9 cm. Ligaments Suboptimally assessed by CT. Muscles and Tendons Fatty atrophy of the musculature of the thigh and lower leg. Soft tissues Diffuse edema through the soft tissues of the right lower extremity extending from the level of hip inferiorly along the lateral and medial thigh and circumferentially throughout the knee and calf. This is not substantially changed from 05/12/2022. Heterogenous loculated collection along the medial tibia proximally the collection measures 8.9 x 3.6 in the axial plane, previously 10.0 x 4.2 cm using similar measuring technique. This extends for a length of approximately 11.0 cm, previously 13.1 cm. The collection again demonstrates heterogenous intermediate density without definite wall thickening. Given slight decrease in size and heterogenous intermediate density this is favored to represent a hematoma. IMPRESSION: RIGHT: 1. Slightly decreased size of the heterogenous intermediate density fluid collection in the medial/proximal calf favored to represent a hematoma. Abscess or soft tissue mass are differential considerations but considered less likely. 2. Unchanged diffuse soft tissue edema throughout the right leg. LEFT: 1. Soft tissue wound about the lateral proximal calf with overlying dressing material extend into a 5.4 x 2.0 x 9.1 cm gas and fluid collection. Differential considerations include abscess or hematoma with or without superimposed infection. 2. Additional large 10.1 x 3.6 x 25.9 cm intermediate density fluid collection in the posterolateral left leg centered about the knee consistent with hematoma. Differential considerations are soft tissue mass or abscess but these are considered less likely. 3. Diffuse soft tissue edema throughout  the right leg. Electronically Signed   By: Minerva Fester M.D.   On: 06/22/2022 18:05   CT TIBIA FIBULA RIGHT WO CONTRAST  Result Date: 06/22/2022 CLINICAL DATA:  Lower leg trauma with swelling from mid thigh to calf with hematoma and worsening anemia. EXAM: CT OF THE LOWER BILATERAL EXTREMITY WITHOUT CONTRAST TECHNIQUE: Multidetector CT imaging of the right lower extremity was performed according to the standard protocol. RADIATION DOSE REDUCTION: This exam was performed according to the departmental dose-optimization program which includes automated exposure control, adjustment of the mA and/or kV according to  patient size and/or use of iterative reconstruction technique. COMPARISON:  CT 05/12/2022 FINDINGS: LEFT: Bones/Joint/Cartilage Demineralization. No acute fracture in the left femur, tibia, or fibula. No dislocation of the hip or knee. No knee joint effusion. Degenerative changes left hip and left knee. Ligaments Suboptimally assessed by CT. Muscles and Tendons Fatty atrophy of the musculature of the thigh and lower leg. Soft tissues Diffuse edema through the soft tissues of the left lower extremity extending from the level of the inferior along the lateral and medial thigh answer from frontally about the knee and calf. RIGHT: Bones/Joint/Cartilage Demineralization. No acute fracture in the right femur, tibia, or fibula. No dislocation of the hip or knee. No knee joint effusion. Degenerative changes right hip and right knee. Soft tissue wound about the anterolateral proximal calf with overlying dressing material. Dressing material extends into the subcutaneous fat in the area of a gas and fluid collection which measures 5.4 x 2.0 cm in the axial plane and extends inferiorly approximately 9.1 cm. Posterior to the gas and fluid collection there is a heterogenous intermediate density collection which extends from the posterolateral distal thigh into the posterolateral proximal calf. This measures 10.1 x 3.6  cm in the axial plane and encompasses a craniocaudal dimension of 25.9 cm. Ligaments Suboptimally assessed by CT. Muscles and Tendons Fatty atrophy of the musculature of the thigh and lower leg. Soft tissues Diffuse edema through the soft tissues of the right lower extremity extending from the level of hip inferiorly along the lateral and medial thigh and circumferentially throughout the knee and calf. This is not substantially changed from 05/12/2022. Heterogenous loculated collection along the medial tibia proximally the collection measures 8.9 x 3.6 in the axial plane, previously 10.0 x 4.2 cm using similar measuring technique. This extends for a length of approximately 11.0 cm, previously 13.1 cm. The collection again demonstrates heterogenous intermediate density without definite wall thickening. Given slight decrease in size and heterogenous intermediate density this is favored to represent a hematoma. IMPRESSION: RIGHT: 1. Slightly decreased size of the heterogenous intermediate density fluid collection in the medial/proximal calf favored to represent a hematoma. Abscess or soft tissue mass are differential considerations but considered less likely. 2. Unchanged diffuse soft tissue edema throughout the right leg. LEFT: 1. Soft tissue wound about the lateral proximal calf with overlying dressing material extend into a 5.4 x 2.0 x 9.1 cm gas and fluid collection. Differential considerations include abscess or hematoma with or without superimposed infection. 2. Additional large 10.1 x 3.6 x 25.9 cm intermediate density fluid collection in the posterolateral left leg centered about the knee consistent with hematoma. Differential considerations are soft tissue mass or abscess but these are considered less likely. 3. Diffuse soft tissue edema throughout the right leg. Electronically Signed   By: Minerva Fester M.D.   On: 06/22/2022 18:05   CT Tibia Fibula Left Wo Contrast  Result Date: 06/22/2022 CLINICAL DATA:   Lower leg trauma with swelling from mid thigh to calf with hematoma and worsening anemia. EXAM: CT OF THE LOWER BILATERAL EXTREMITY WITHOUT CONTRAST TECHNIQUE: Multidetector CT imaging of the right lower extremity was performed according to the standard protocol. RADIATION DOSE REDUCTION: This exam was performed according to the departmental dose-optimization program which includes automated exposure control, adjustment of the mA and/or kV according to patient size and/or use of iterative reconstruction technique. COMPARISON:  CT 05/12/2022 FINDINGS: LEFT: Bones/Joint/Cartilage Demineralization. No acute fracture in the left femur, tibia, or fibula. No dislocation of the hip or  knee. No knee joint effusion. Degenerative changes left hip and left knee. Ligaments Suboptimally assessed by CT. Muscles and Tendons Fatty atrophy of the musculature of the thigh and lower leg. Soft tissues Diffuse edema through the soft tissues of the left lower extremity extending from the level of the inferior along the lateral and medial thigh answer from frontally about the knee and calf. RIGHT: Bones/Joint/Cartilage Demineralization. No acute fracture in the right femur, tibia, or fibula. No dislocation of the hip or knee. No knee joint effusion. Degenerative changes right hip and right knee. Soft tissue wound about the anterolateral proximal calf with overlying dressing material. Dressing material extends into the subcutaneous fat in the area of a gas and fluid collection which measures 5.4 x 2.0 cm in the axial plane and extends inferiorly approximately 9.1 cm. Posterior to the gas and fluid collection there is a heterogenous intermediate density collection which extends from the posterolateral distal thigh into the posterolateral proximal calf. This measures 10.1 x 3.6 cm in the axial plane and encompasses a craniocaudal dimension of 25.9 cm. Ligaments Suboptimally assessed by CT. Muscles and Tendons Fatty atrophy of the  musculature of the thigh and lower leg. Soft tissues Diffuse edema through the soft tissues of the right lower extremity extending from the level of hip inferiorly along the lateral and medial thigh and circumferentially throughout the knee and calf. This is not substantially changed from 05/12/2022. Heterogenous loculated collection along the medial tibia proximally the collection measures 8.9 x 3.6 in the axial plane, previously 10.0 x 4.2 cm using similar measuring technique. This extends for a length of approximately 11.0 cm, previously 13.1 cm. The collection again demonstrates heterogenous intermediate density without definite wall thickening. Given slight decrease in size and heterogenous intermediate density this is favored to represent a hematoma. IMPRESSION: RIGHT: 1. Slightly decreased size of the heterogenous intermediate density fluid collection in the medial/proximal calf favored to represent a hematoma. Abscess or soft tissue mass are differential considerations but considered less likely. 2. Unchanged diffuse soft tissue edema throughout the right leg. LEFT: 1. Soft tissue wound about the lateral proximal calf with overlying dressing material extend into a 5.4 x 2.0 x 9.1 cm gas and fluid collection. Differential considerations include abscess or hematoma with or without superimposed infection. 2. Additional large 10.1 x 3.6 x 25.9 cm intermediate density fluid collection in the posterolateral left leg centered about the knee consistent with hematoma. Differential considerations are soft tissue mass or abscess but these are considered less likely. 3. Diffuse soft tissue edema throughout the right leg. Electronically Signed   By: Minerva Fester M.D.   On: 06/22/2022 18:05   CT FEMUR LEFT WO CONTRAST  Result Date: 06/22/2022 CLINICAL DATA:  Lower leg trauma with swelling from mid thigh to calf with hematoma and worsening anemia. EXAM: CT OF THE LOWER BILATERAL EXTREMITY WITHOUT CONTRAST TECHNIQUE:  Multidetector CT imaging of the right lower extremity was performed according to the standard protocol. RADIATION DOSE REDUCTION: This exam was performed according to the departmental dose-optimization program which includes automated exposure control, adjustment of the mA and/or kV according to patient size and/or use of iterative reconstruction technique. COMPARISON:  CT 05/12/2022 FINDINGS: LEFT: Bones/Joint/Cartilage Demineralization. No acute fracture in the left femur, tibia, or fibula. No dislocation of the hip or knee. No knee joint effusion. Degenerative changes left hip and left knee. Ligaments Suboptimally assessed by CT. Muscles and Tendons Fatty atrophy of the musculature of the thigh and lower leg. Soft tissues  Diffuse edema through the soft tissues of the left lower extremity extending from the level of the inferior along the lateral and medial thigh answer from frontally about the knee and calf. RIGHT: Bones/Joint/Cartilage Demineralization. No acute fracture in the right femur, tibia, or fibula. No dislocation of the hip or knee. No knee joint effusion. Degenerative changes right hip and right knee. Soft tissue wound about the anterolateral proximal calf with overlying dressing material. Dressing material extends into the subcutaneous fat in the area of a gas and fluid collection which measures 5.4 x 2.0 cm in the axial plane and extends inferiorly approximately 9.1 cm. Posterior to the gas and fluid collection there is a heterogenous intermediate density collection which extends from the posterolateral distal thigh into the posterolateral proximal calf. This measures 10.1 x 3.6 cm in the axial plane and encompasses a craniocaudal dimension of 25.9 cm. Ligaments Suboptimally assessed by CT. Muscles and Tendons Fatty atrophy of the musculature of the thigh and lower leg. Soft tissues Diffuse edema through the soft tissues of the right lower extremity extending from the level of hip inferiorly along  the lateral and medial thigh and circumferentially throughout the knee and calf. This is not substantially changed from 05/12/2022. Heterogenous loculated collection along the medial tibia proximally the collection measures 8.9 x 3.6 in the axial plane, previously 10.0 x 4.2 cm using similar measuring technique. This extends for a length of approximately 11.0 cm, previously 13.1 cm. The collection again demonstrates heterogenous intermediate density without definite wall thickening. Given slight decrease in size and heterogenous intermediate density this is favored to represent a hematoma. IMPRESSION: RIGHT: 1. Slightly decreased size of the heterogenous intermediate density fluid collection in the medial/proximal calf favored to represent a hematoma. Abscess or soft tissue mass are differential considerations but considered less likely. 2. Unchanged diffuse soft tissue edema throughout the right leg. LEFT: 1. Soft tissue wound about the lateral proximal calf with overlying dressing material extend into a 5.4 x 2.0 x 9.1 cm gas and fluid collection. Differential considerations include abscess or hematoma with or without superimposed infection. 2. Additional large 10.1 x 3.6 x 25.9 cm intermediate density fluid collection in the posterolateral left leg centered about the knee consistent with hematoma. Differential considerations are soft tissue mass or abscess but these are considered less likely. 3. Diffuse soft tissue edema throughout the right leg. Electronically Signed   By: Minerva Fester M.D.   On: 06/22/2022 18:05    Microbiology: Results for orders placed or performed during the hospital encounter of 06/22/22  MRSA Next Gen by PCR, Nasal     Status: Abnormal   Collection Time: 06/23/22  8:30 PM   Specimen: Nasal Mucosa; Nasal Swab  Result Value Ref Range Status   MRSA by PCR Next Gen DETECTED (A) NOT DETECTED Final    Comment: RESULT CALLED TO, READ BACK BY AND VERIFIED WITH: WINIFRED SWABY @2224   ON 06/23/22 SKL (NOTE) The GeneXpert MRSA Assay (FDA approved for NASAL specimens only), is one component of a comprehensive MRSA colonization surveillance program. It is not intended to diagnose MRSA infection nor to guide or monitor treatment for MRSA infections. Test performance is not FDA approved in patients less than 48 years old. Performed at [facility], [address] [place], [place] [address]   Aerobic/Anaerobic Culture w Gram Stain (surgical/deep wound)     Status: None (Preliminary result)   Collection Time: 06/24/22  3:02 PM   Specimen: Wound; Abscess  Result Value Ref  Range Status   Specimen Description   Final    WOUND Performed at [facility], [address], [place], [place] [address]    Special Requests   Final    NONE Performed at [facility], [address] [place], [place] [address]    Gram Stain   Final    NO WBC SEEN NO ORGANISMS SEEN Performed at [facility], [address], [place], [place] [address]    Culture   Final    RARE METHICILLIN RESISTANT STAPHYLOCOCCUS AUREUS RARE PROVIDENCIA STUARTII NO ANAEROBES ISOLATED; CULTURE IN PROGRESS FOR 5 DAYS    Report Status PENDING  Incomplete   Organism ID, Bacteria PROVIDENCIA STUARTII  Final   Organism ID, Bacteria METHICILLIN RESISTANT STAPHYLOCOCCUS AUREUS  Final      Susceptibility   Methicillin resistant staphylococcus aureus - MIC*    CIPROFLOXACIN >=8 RESISTANT Resistant     ERYTHROMYCIN >=8 RESISTANT Resistant     GENTAMICIN <=0.5 SENSITIVE Sensitive     OXACILLIN >=4 RESISTANT Resistant     TETRACYCLINE >=16 RESISTANT Resistant     VANCOMYCIN 1 SENSITIVE Sensitive     TRIMETH/SULFA >=320 RESISTANT Resistant     CLINDAMYCIN <=0.25 SENSITIVE Sensitive     RIFAMPIN <=0.5 SENSITIVE Sensitive     Inducible Clindamycin NEGATIVE Sensitive     LINEZOLID 2 SENSITIVE Sensitive     * RARE METHICILLIN RESISTANT STAPHYLOCOCCUS AUREUS   Providencia stuartii - MIC*     AMPICILLIN >=32 RESISTANT Resistant     CEFEPIME <=0.12 SENSITIVE Sensitive     CEFTAZIDIME <=1 SENSITIVE Sensitive     CEFTRIAXONE <=0.25 SENSITIVE Sensitive     CIPROFLOXACIN <=0.25 SENSITIVE Sensitive     GENTAMICIN RESISTANT Resistant     IMIPENEM 1 SENSITIVE Sensitive     TRIMETH/SULFA <=20 SENSITIVE Sensitive     AMPICILLIN/SULBACTAM >=32 RESISTANT Resistant     PIP/TAZO <=4 SENSITIVE Sensitive     * RARE PROVIDENCIA STUARTII    Labs: CBC: Recent Labs  Lab 06/22/22 1415 06/22/22 2037 06/24/22 1853 06/25/22 1428 06/26/22 0504 06/27/22 0417 06/28/22 0531  WBC 16.9*   < > 14.5* 12.2* 12.1* 10.2 10.6*  NEUTROABS 13.9*  --   --   --   --   --   --   HGB 5.4*   < > 8.5* 8.2* 7.5* 8.3* 8.1*  HCT 17.1*   < > 26.1* 25.2* 24.2* 26.9* 26.5*  MCV 107.5*   < > 96.3 96.6 98.8 100.4* 100.8*  PLT 336   < > 186 227 218 237 257   < > = values in this interval not displayed.    Basic Metabolic Panel: Recent Labs  Lab 06/24/22 0411 06/25/22 1428 06/26/22 0504 06/27/22 0417 06/28/22 0531  NA 132* 131* 133* 134* 135  K 4.7 3.8 3.8 3.8 3.7  CL 100 100 102 101 103  CO2 23 23 24 26 27   GLUCOSE 94 153* 103* 76 90  BUN 41* 38* 39* 36* 26*  CREATININE 2.12* 1.49* 1.19 0.96 0.75  CALCIUM 7.9* 7.9* 8.0* 7.9* 7.9*    Liver Function Tests: Recent Labs  Lab 06/22/22 1415 06/23/22 0522  AST 28 25  ALT 14 13  ALKPHOS 49 52  BILITOT 1.0 1.2  PROT 5.3* 5.4*  ALBUMIN 2.8* 2.9*    CBG: No results for input(s): "GLUCAP" in the last 168 hours.  Discharge time spent: greater than 30 minutes.  This record has been created using Conservation officer, historic buildings.  Errors have been sought and corrected,but may not always be located. Such creation errors do not reflect on the standard of care.   Signed: Arnetha Courser, MD [facility] 06/29/2022

## 2022-06-29 NOTE — Progress Notes (Signed)
Daily Progress Note   Patient Name: Daniel Eaton.       Date: 06/29/2022 DOB: 05/18/1940  Age: 82 y.o. MRN#: [id number] Attending Physician: Arnetha Courser, MD Primary Care Physician: Lauro Regulus, MD Admit Date: 06/22/2022  Reason for Consultation/Follow-up: Establishing goals of care  Subjective: Notes reviewed.  Patient resting in bed listening to music while eating lunch. No family present.  He understands he is scheduled for discharge today to [facility]. He shares he is concerned about care for his wound vac - I share with him from chart review it appears [facility] is aware of his wound vac that will need care.   We discussed his GOC. Discussion was had today regarding advanced directives.  Values and goals of care important to patient and family were attempted to be elicited.  Discussed limitations of medical interventions to prolong quality of life in some situations and discussed the concept of human mortality.  We review code status again - this was discussed yesterday with previous provider. He tells me he has not been able to talk to any family about it but has decided independently for DNR/DNI status. I offer to call his significant other to discuss this further but he declines. Upon review of chart I see that he has been listed as DNR previously and has signed DNR in his chart. Notified RN of change in code status.  Asked patient about HCPOA that was discussed yesterday, he tells me he is not ready to complete this today.      Length of Stay: 7  Current Medications: Scheduled Meds:   amiodarone  200 mg Oral Daily   bacitracin   Topical BID   chlorhexidine  60 mL Topical Once   Chlorhexidine Gluconate Cloth  6 each Topical Daily   ciprofloxacin  500 mg Oral  BID   fluticasone furoate-vilanterol  1 puff Inhalation Daily   And   umeclidinium bromide  1 puff Inhalation Daily   levothyroxine  88 mcg Oral Daily   linezolid  600 mg Oral Q12H   loratadine  10 mg Oral Daily   multivitamin with minerals  1 tablet Oral Daily   nutrition supplement (JUVEN)  1 packet Oral BID BM   polyethylene glycol  17 g Oral Daily   senna-docusate  2 tablet  Oral BID   sodium chloride flush  3 mL Intravenous Q12H    Continuous Infusions:   PRN Meds: acetaminophen **OR** acetaminophen, albuterol, HYDROcodone-acetaminophen, ondansetron (ZOFRAN) IV, oxyCODONE-acetaminophen  Physical Exam Constitutional:      General: He is not in acute distress. Pulmonary:     Effort: Pulmonary effort is normal.  Skin:    General: Skin is warm and dry.  Neurological:     Mental Status: He is alert.             Vital Signs: BP 123/73   Pulse 95   Temp (!) 97.5 F (36.4 C)   Resp 20   Ht 5\' 11"  (1.803 m)   Wt 123 kg   SpO2 95%   BMI 37.82 kg/m  SpO2: SpO2: 95 % O2 Device: O2 Device: Room Air O2 Flow Rate: O2 Flow Rate (L/min): 2 L/min  Intake/output summary:  Intake/Output Summary (Last 24 hours) at 06/29/2022 1319 Last data filed at 06/29/2022 1610 Gross per 24 hour  Intake 400 ml  Output 400 ml  Net 0 ml    LBM: Last BM Date : 06/28/22 Baseline Weight: Weight: 113.3 kg Most recent weight: Weight: 123 kg   Patient Active Problem List   Diagnosis Date Noted   Leg hematoma, left, initial encounter 06/22/2022   Lactic acidosis 06/22/2022   Macrocytic anemia 06/06/2022   Cellulitis of left lower extremity 02/14/2021   COPD (chronic obstructive pulmonary disease) (HCC)    Stroke (HCC)    Acute renal failure superimposed on stage 3a chronic kidney disease (HCC)    Elevated troponin    Normocytic anemia    Generalized weakness    Acute renal failure superimposed on chronic kidney disease (HCC)    Myalgia due to statin 12/18/2019   Atrial fibrillation  (HCC) 04/09/2019   Essential hypertension 04/09/2019   Venous stasis dermatitis of both lower extremities 04/09/2019   Lower limb ulcer, ankle, right, limited to breakdown of skin (HCC) 04/09/2019   Rhabdomyolysis 12/10/2018   Impairment of balance 10/02/2018   Physical deconditioning 10/02/2018   Sepsis (HCC) 07/28/2018   Primary osteoarthritis of right knee 09/22/2017   Healthcare maintenance 03/16/2017   Aortic atherosclerosis (HCC) 03/15/2016   Cough 03/20/2015   Bronchitis, chronic obstructive w acute bronchitis (HCC) 03/20/2015   Umbilical hernia without obstruction and without gangrene 01/12/2015   Ventral hernia without obstruction or gangrene 01/02/2015   Morbid obesity with BMI of 45.0-49.9, adult (HCC) 11/23/2013   Achalasia 06/30/2013   Cardiomyopathy (HCC) 06/30/2013   OSA (obstructive sleep apnea) 06/30/2013   Hypothyroid 06/30/2013    Palliative Care Assessment & Plan   Recommendations/Plan: Code status change to DNR/DNI per conversation Outpatient palliative has been arranged per CM notes  Thank you for allowing the Palliative Medicine Team to assist in the care of this patient.   Janeece Agee, NP  Please contact Palliative Medicine Team phone at [phone] for questions and concerns.

## 2022-06-29 NOTE — Progress Notes (Signed)
Pt refused turning and repositioning throughout the shift, despite education on risks and complications such as impaired skin integrity.

## 2022-06-29 NOTE — TOC Progression Note (Addendum)
Transition of Care (TOC) - Progression Note    Patient Details  Name: Daniel Eaton. MRN: [id number] Date of Birth: Jul 01, 1940  Transition [facility] CM/SW Contact  Marlowe Sax, RN Phone Number: 06/29/2022, 9:12 AM  Clinical Narrative:    I reached out to Stanton Kidney at [facility] and asked if they would accept the patient back today Awaiting a response Stanton Kidney responded that he can return today to room 224A, will need updated DC summary they have a wound vac and will apply once he gets there  Expected Discharge Plan: Skilled Nursing Facility Barriers to Discharge: Continued Medical Work up  Expected Discharge Plan and Services       Living arrangements for the past 2 months: Skilled Nursing Facility Expected Discharge Date: 06/28/22                                     Social Determinants of Health (SDOH) Interventions SDOH Screenings   Food Insecurity: No Food Insecurity (06/23/2022)  Housing: Low Risk  (06/23/2022)  Transportation Needs: No Transportation Needs (06/23/2022)  Utilities: Not At Risk (06/23/2022)  Depression (PHQ2-9): Low Risk  (06/06/2022)  Tobacco Use: Medium Risk (06/29/2022)    Readmission Risk Interventions     No data to display

## 2022-06-30 DIAGNOSIS — S8012XA Contusion of left lower leg, initial encounter: Secondary | ICD-10-CM | POA: Diagnosis not present

## 2022-06-30 DIAGNOSIS — I509 Heart failure, unspecified: Secondary | ICD-10-CM | POA: Diagnosis not present

## 2022-07-05 DIAGNOSIS — I872 Venous insufficiency (chronic) (peripheral): Secondary | ICD-10-CM | POA: Diagnosis not present

## 2022-07-05 DIAGNOSIS — L97229 Non-pressure chronic ulcer of left calf with unspecified severity: Secondary | ICD-10-CM | POA: Diagnosis not present

## 2022-07-05 DIAGNOSIS — M6281 Muscle weakness (generalized): Secondary | ICD-10-CM | POA: Diagnosis not present

## 2022-07-05 DIAGNOSIS — I509 Heart failure, unspecified: Secondary | ICD-10-CM | POA: Diagnosis not present

## 2022-07-06 ENCOUNTER — Ambulatory Visit: Payer: PPO | Admitting: Oncology

## 2022-07-06 DIAGNOSIS — F32A Depression, unspecified: Secondary | ICD-10-CM | POA: Diagnosis not present

## 2022-07-07 ENCOUNTER — Encounter: Payer: Self-pay | Admitting: Oncology

## 2022-07-07 ENCOUNTER — Inpatient Hospital Stay: Payer: PPO | Attending: Oncology | Admitting: Oncology

## 2022-07-07 VITALS — BP 143/72 | HR 109

## 2022-07-07 DIAGNOSIS — Z8052 Family history of malignant neoplasm of bladder: Secondary | ICD-10-CM | POA: Diagnosis not present

## 2022-07-07 DIAGNOSIS — D7589 Other specified diseases of blood and blood-forming organs: Secondary | ICD-10-CM | POA: Diagnosis not present

## 2022-07-07 DIAGNOSIS — Z87891 Personal history of nicotine dependence: Secondary | ICD-10-CM | POA: Insufficient documentation

## 2022-07-07 DIAGNOSIS — Z7901 Long term (current) use of anticoagulants: Secondary | ICD-10-CM | POA: Insufficient documentation

## 2022-07-07 DIAGNOSIS — E538 Deficiency of other specified B group vitamins: Secondary | ICD-10-CM | POA: Insufficient documentation

## 2022-07-07 DIAGNOSIS — D539 Nutritional anemia, unspecified: Secondary | ICD-10-CM | POA: Diagnosis not present

## 2022-07-07 DIAGNOSIS — I4891 Unspecified atrial fibrillation: Secondary | ICD-10-CM | POA: Diagnosis not present

## 2022-07-07 MED ORDER — B-12 1000 MCG SL SUBL: 1000.0000 ug | SUBLINGUAL_TABLET | Freq: Every day | SUBLINGUAL | 1 refills | Status: DC

## 2022-07-07 NOTE — Patient Instructions (Addendum)
Pt was very uncomfortable in wheelchair today during visit. He would benefit to come to next appointment via stretcher, if condition remains the same as today.

## 2022-07-07 NOTE — Progress Notes (Signed)
Hematology/Oncology Consult note [phone] [phone]     REFERRING PROVIDER: Lauro Regulus, MD  CHIEF COMPLAINTS/REASON FOR VISIT:  Anemia, B12 deficiency  ASSESSMENT & PLAN:  Macrocytic anemia Recent hemoglobin decrease could be secondary to hematoma.  Chronic macrocytosis.  Lab workup results were reviewed and discussed with patient.  Possibly secondary to B12 deficiency. Iron levels were normal however he had further hemoglobin drop due to recent hematoma.  He declined repeat testing today.  I recommend patient to continue empiric iron supplementation.   Vitamin B12 deficiency Recommend patient to take vitamin B12 sublingual 1000 mcg daily.  Prescription provided to patient.  Orders Placed This Encounter  Procedures   CBC with Differential (Cancer Center Only)    Standing Status:   Future    Standing Expiration Date:   07/07/2023   Iron and TIBC    Standing Status:   Future    Standing Expiration Date:   07/07/2023   Ferritin    Standing Status:   Future    Standing Expiration Date:   07/07/2023   Vitamin B12    Standing Status:   Future    Standing Expiration Date:   07/07/2023   Folate    Standing Status:   Future    Standing Expiration Date:   07/07/2023   CMP (Cancer Center only)    Standing Status:   Future    Standing Expiration Date:   07/07/2023   RTC 3 months.  All questions were answered. The patient knows to call the clinic with any problems, questions or concerns.  [name], MD, PhD [facility] Hematology Oncology 07/07/2022     HISTORY OF PRESENTING ILLNESS:  Daniel Eaton. is a  82 y.o.  male with PMH listed below who was referred to me for anemia Reviewed patient's recent labs that was done.   Reviewed patient's previous labs ordered by other provider, anemia is chronic onset , duration is since  04/26/22 Hb 6.8, mcv 105, plt 480 04/29/22 Hb 8.7, mcv 106 plt 506, B12 581, Folate 6.1, Cr 1,  05/24/22 Hb 11.1, MCV  106.8, wbc 6, plt 273 Patient reports feeling tired.   He denies recent chest pain on exertion, pre-syncopal episodes, or palpitations He had not noticed any recent bleeding such as epistaxis, hematuria or hematochezia.  Patient has A Fib,  previously on pradaxa, and Xarelto, now on Eliquis 5mg  BID. He has noticed more easy bruising while on Eliquis.   Recent history of right lower extremity hematoma, was seen at ER on 05/12/22, initial hematoma was sustained by hitting his chairlift. Venous ultrasound shows no signs of DVT. CT confirmed hematoma   INTERVAL HISTORY Daniel Men. is a 82 y.o. male who has above history reviewed by me today presents for follow up visit for anemia, B12 deficiency  Patient had blood work done after last visit.  Since then, he was admitted due to left lower extremity hematoma burst  Status post drainage of hematoma, application of wound VAC.  Wound culture confirmed MRSA and providencia  Is currently off anticoagulation. Patient reports not feeling well today.  He complains about feeling not comfortable sitting in the wheelchair.   MEDICAL HISTORY:  Past Medical History:  Diagnosis Date   A-fib (HCC)    Cellulitis    CHF (congestive heart failure) (HCC)    COPD (chronic obstructive pulmonary disease) (HCC)    Difficult intubation    Hyperlipidemia    Hypertension    Hypothyroidism  Kidney disease    Sleep apnea    Stroke [facility])    Thyroid disease     SURGICAL HISTORY: Past Surgical History:  Procedure Laterality Date   APPLICATION OF WOUND VAC Left 06/24/2022   Procedure: APPLICATION OF WOUND VAC;  Surgeon: Renford Dills, MD;  Location: [facility] ORS;  Service: Vascular;  Laterality: Left;   APPLICATION OF WOUND VAC  06/28/2022   Procedure: APPLICATION OF WOUND VAC;  Surgeon: Renford Dills, MD;  Location: [facility] ORS;  Service: Vascular;;   CARDIAC CATHETERIZATION     CATARACT EXTRACTION W/ INTRAOCULAR LENS  IMPLANT, BILATERAL      COLONOSCOPY  2008   HEMATOMA EVACUATION Left 06/24/2022   Procedure: EVACUATION HEMATOMA;  Surgeon: Renford Dills, MD;  Location: [facility] ORS;  Service: Vascular;  Laterality: Left;  wound vac   HERNIA REPAIR     bilateral inguinal hernia/ [place]   HERNIA REPAIR  02/02/2015   18 x 28 cm ventral light mesh placed laparoscopically.   INCISION AND DRAINAGE Left 06/24/2022   Procedure: INCISION AND DRAINAGE;  Surgeon: Renford Dills, MD;  Location: [facility] ORS;  Service: Vascular;  Laterality: Left;   TONSILLECTOMY     UMBILICAL HERNIA REPAIR N/A 02/02/2015   Procedure: HERNIA REPAIR UMBILICAL ADULT;  Surgeon: Earline Mayotte, MD;  Location: [facility] ORS;  Service: General;  Laterality: N/A;   VENTRAL HERNIA REPAIR N/A 02/02/2015   Procedure: LAPAROSCOPIC VENTRAL HERNIA;  Surgeon: Earline Mayotte, MD;  Location: [facility] ORS;  Service: General;  Laterality: N/A;   vp shunt placement  1979    SOCIAL HISTORY: Social History   Socioeconomic History   Marital status: Divorced    Spouse name: Not on file   Number of children: Not on file   Years of education: Not on file   Highest education level: Not on file  Occupational History   Not on file  Tobacco Use   Smoking status: Former    Packs/day: 1.00    Years: 41.00    Additional pack years: 0.00    Total pack years: 41.00    Types: Cigarettes    Quit date: 1999    Years since quitting: 25.4   Smokeless tobacco: Never  Vaping Use   Vaping Use: Never used  Substance and Sexual Activity   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   Drug use: No   Sexual activity: Not Currently  Other Topics Concern   Not on file  Social History Narrative   Not on file   Social Determinants of Health   Financial Resource Strain: Not on file  Food Insecurity: No Food Insecurity (06/23/2022)   Hunger Vital Sign    Worried About Running Out of Food in the Last Year: Never true    Ran Out of Food in the Last Year: Never true  Transportation  Needs: No Transportation Needs (06/23/2022)   PRAPARE - Administrator, Civil Service (Medical): No    Lack of Transportation (Non-Medical): No  Physical Activity: Not on file  Stress: Not on file  Social Connections: Not on file  Intimate Partner Violence: Not At Risk (06/23/2022)   Humiliation, Afraid, Rape, and Kick questionnaire    Fear of Current or Ex-Partner: No    Emotionally Abused: No    Physically Abused: No    Sexually Abused: No    FAMILY HISTORY: Family History  Problem Relation Age of Onset   Heart disease Mother  Alcohol abuse Father    Bladder Cancer Sister     ALLERGIES:  is allergic to diltiazem hcl.  MEDICATIONS:  Current Outpatient Medications  Medication Sig Dispense Refill   acetaminophen (TYLENOL) 500 MG tablet Take 500 mg by mouth every 12 (twelve) hours.     acidophilus (RISAQUAD) CAPS capsule Take 1 capsule by mouth 2 (two) times daily.     albuterol (VENTOLIN HFA) 108 (90 Base) MCG/ACT inhaler Inhale 2 puffs into the lungs every 6 (six) hours as needed for wheezing or shortness of breath.     amiodarone (PACERONE) 200 MG tablet Take 200 mg by mouth daily.     [START ON 07/12/2022] apixaban (ELIQUIS) 5 MG TABS tablet Take 1 tablet (5 mg total) by mouth 2 (two) times daily. 60 tablet    Capsaicin (ASPERCREME PAIN RELIEF PATCH EX) Apply topically every 12 (twelve) hours.     Carboxymethylcellulose Sod PF (REFRESH CELLUVISC) 1 % GEL Apply 1 drop to eye every 12 (twelve) hours. Right eye     ciprofloxacin (CIPRO) 500 MG tablet Take 1 tablet (500 mg total) by mouth 2 (two) times daily for 10 days. 20 tablet 0   ferrous sulfate 325 (65 FE) MG tablet Take 1 tablet (325 mg total) by mouth daily. 30 tablet 3   Fluticasone-Umeclidin-Vilant (TRELEGY ELLIPTA) 100-62.5-25 MCG/INH AEPB Inhale 1 puff into the lungs daily.     levothyroxine (SYNTHROID) 88 MCG tablet Take 88 mcg by mouth daily.      linezolid (ZYVOX) 600 MG tablet Take 1 tablet (600 mg total)  by mouth every 12 (twelve) hours for 10 days. 20 tablet 0   metoprolol succinate (TOPROL-XL) 50 MG 24 hr tablet Take 50 mg by mouth daily. Take with or immediately following a meal.     Multiple Vitamin (MULTI-VITAMINS) TABS Take 1 tablet by mouth daily.     omeprazole (PRILOSEC) 20 MG capsule Take 20 mg by mouth daily.     polyethylene glycol (MIRALAX / GLYCOLAX) 17 g packet Take 17 g by mouth daily as needed. (Patient taking differently: Take 17 g by mouth daily as needed for mild constipation.) 14 each 0   potassium chloride SA (KLOR-CON M) 20 MEQ tablet Take 20 mEq by mouth daily.     senna-docusate (SENOKOT-S) 8.6-50 MG tablet Take 2 tablets by mouth 2 (two) times daily. 120 tablet 0   sertraline (ZOLOFT) 50 MG tablet Take 50 mg by mouth daily.     spironolactone (ALDACTONE) 25 MG tablet Take 25 mg by mouth daily.     torsemide (DEMADEX) 20 MG tablet Hold pending outpatient followup due to acute kidney injury. (Patient taking differently: Take 20 mg by mouth daily.) 30 tablet 0   traZODone (DESYREL) 50 MG tablet Take 25 mg by mouth at bedtime.     Cyanocobalamin (B-12) 1000 MCG SUBL Place 1,000 mcg under the tongue daily. 90 tablet 1   loratadine (CLARITIN) 10 MG tablet Take 10 mg by mouth daily.     No current facility-administered medications for this visit.    Review of Systems  Constitutional:  Positive for fatigue. Negative for appetite change, chills, fever and unexpected weight change.  HENT:   Negative for hearing loss and voice change.   Eyes:  Negative for eye problems and icterus.  Respiratory:  Negative for chest tightness, cough and shortness of breath.   Cardiovascular:  Negative for chest pain and leg swelling.  Gastrointestinal:  Negative for abdominal distention and abdominal pain.  Endocrine:  Negative for hot flashes.  Genitourinary:  Negative for difficulty urinating, dysuria and frequency.   Musculoskeletal:  Negative for arthralgias.       Right lower extremity  hematoma  Skin:  Negative for itching and rash.  Neurological:  Negative for light-headedness and numbness.  Hematological:  Negative for adenopathy. Bruises/bleeds easily.  Psychiatric/Behavioral:  Negative for confusion.     PHYSICAL EXAMINATION: Vitals:   07/07/22 1119  BP: (!) 143/72  Pulse: (!) 109  SpO2: 99%   There were no vitals filed for this visit.  Physical Exam Constitutional:      General: He is not in acute distress. HENT:     Head: Normocephalic and atraumatic.  Eyes:     General: No scleral icterus. Cardiovascular:     Rate and Rhythm: Normal rate and regular rhythm.     Heart sounds: Normal heart sounds.  Pulmonary:     Effort: Pulmonary effort is normal. No respiratory distress.     Breath sounds: No wheezing.  Abdominal:     General: Bowel sounds are normal. There is no distension.     Palpations: Abdomen is soft.  Musculoskeletal:        General: No deformity. Normal range of motion.     Cervical back: Normal range of motion and neck supple.     Comments: Right lower extremity fluid collection with mild skin erythematous changes.   Skin:    General: Skin is warm and dry.     Findings: No erythema or rash.  Neurological:     Mental Status: He is alert and oriented to person, place, and time. Mental status is at baseline.     Cranial Nerves: No cranial nerve deficit.     Coordination: Coordination normal.  Psychiatric:        Mood and Affect: Mood normal.      LABORATORY DATA:  I have reviewed the data as listed    Latest Ref Rng & Units 06/28/2022    5:31 AM 06/27/2022    4:17 AM 06/26/2022    5:04 AM  CBC  WBC 4.0 - 10.5 K/uL 10.6  10.2  12.1   Hemoglobin 13.0 - 17.0 g/dL 8.1  8.3  7.5   Hematocrit 39.0 - 52.0 % 26.5  26.9  24.2   Platelets 150 - 400 K/uL 257  237  218       Latest Ref Rng & Units 06/28/2022    5:31 AM 06/27/2022    4:17 AM 06/26/2022    5:04 AM  CMP  Glucose 70 - 99 mg/dL 90  76  962   BUN 8 - 23 mg/dL 26  36  39    Creatinine 0.61 - 1.24 mg/dL 9.52  8.41  3.24   Sodium 135 - 145 mmol/L 135  134  133   Potassium 3.5 - 5.1 mmol/L 3.7  3.8  3.8   Chloride 98 - 111 mmol/L 103  101  102   CO2 22 - 32 mmol/L 27  26  24    Calcium 8.9 - 10.3 mg/dL 7.9  7.9  8.0    Lab Results  Component Value Date   IRON 154 06/06/2022   TIBC 330 06/06/2022   IRONPCTSAT 47 (H) 06/06/2022   FERRITIN 32 06/06/2022     RADIOGRAPHIC STUDIES: I have personally reviewed the radiological images as listed and agreed with the findings in the report. [place] OR NERVE BLOCK-IMAGE ONLY [facility])  Result Date: 06/24/2022 There is no interpretation for  this exam.  This order is for images obtained during a surgical procedure.  Please See "Surgeries" Tab for more information regarding the procedure.   [place] OR NERVE BLOCK-IMAGE ONLY [facility])  Result Date: 06/24/2022 There is no interpretation for this exam.  This order is for images obtained during a surgical procedure.  Please See "Surgeries" Tab for more information regarding the procedure.   CT FEMUR RIGHT WO CONTRAST  Result Date: 06/22/2022 CLINICAL DATA:  Lower leg trauma with swelling from mid thigh to calf with hematoma and worsening anemia. EXAM: CT OF THE LOWER BILATERAL EXTREMITY WITHOUT CONTRAST TECHNIQUE: Multidetector CT imaging of the right lower extremity was performed according to the standard protocol. RADIATION DOSE REDUCTION: This exam was performed according to the departmental dose-optimization program which includes automated exposure control, adjustment of the mA and/or kV according to patient size and/or use of iterative reconstruction technique. COMPARISON:  CT 05/12/2022 FINDINGS: LEFT: Bones/Joint/Cartilage Demineralization. No acute fracture in the left femur, tibia, or fibula. No dislocation of the hip or knee. No knee joint effusion. Degenerative changes left hip and left knee. Ligaments Suboptimally assessed by CT. Muscles and Tendons Fatty atrophy of the musculature of  the thigh and lower leg. Soft tissues Diffuse edema through the soft tissues of the left lower extremity extending from the level of the inferior along the lateral and medial thigh answer from frontally about the knee and calf. RIGHT: Bones/Joint/Cartilage Demineralization. No acute fracture in the right femur, tibia, or fibula. No dislocation of the hip or knee. No knee joint effusion. Degenerative changes right hip and right knee. Soft tissue wound about the anterolateral proximal calf with overlying dressing material. Dressing material extends into the subcutaneous fat in the area of a gas and fluid collection which measures 5.4 x 2.0 cm in the axial plane and extends inferiorly approximately 9.1 cm. Posterior to the gas and fluid collection there is a heterogenous intermediate density collection which extends from the posterolateral distal thigh into the posterolateral proximal calf. This measures 10.1 x 3.6 cm in the axial plane and encompasses a craniocaudal dimension of 25.9 cm. Ligaments Suboptimally assessed by CT. Muscles and Tendons Fatty atrophy of the musculature of the thigh and lower leg. Soft tissues Diffuse edema through the soft tissues of the right lower extremity extending from the level of hip inferiorly along the lateral and medial thigh and circumferentially throughout the knee and calf. This is not substantially changed from 05/12/2022. Heterogenous loculated collection along the medial tibia proximally the collection measures 8.9 x 3.6 in the axial plane, previously 10.0 x 4.2 cm using similar measuring technique. This extends for a length of approximately 11.0 cm, previously 13.1 cm. The collection again demonstrates heterogenous intermediate density without definite wall thickening. Given slight decrease in size and heterogenous intermediate density this is favored to represent a hematoma. IMPRESSION: RIGHT: 1. Slightly decreased size of the heterogenous intermediate density fluid  collection in the medial/proximal calf favored to represent a hematoma. Abscess or soft tissue mass are differential considerations but considered less likely. 2. Unchanged diffuse soft tissue edema throughout the right leg. LEFT: 1. Soft tissue wound about the lateral proximal calf with overlying dressing material extend into a 5.4 x 2.0 x 9.1 cm gas and fluid collection. Differential considerations include abscess or hematoma with or without superimposed infection. 2. Additional large 10.1 x 3.6 x 25.9 cm intermediate density fluid collection in the posterolateral left leg centered about the knee consistent with hematoma. Differential considerations are soft tissue  mass or abscess but these are considered less likely. 3. Diffuse soft tissue edema throughout the right leg. Electronically Signed   By: Minerva Fester M.D.   On: 06/22/2022 18:05   CT TIBIA FIBULA RIGHT WO CONTRAST  Result Date: 06/22/2022 CLINICAL DATA:  Lower leg trauma with swelling from mid thigh to calf with hematoma and worsening anemia. EXAM: CT OF THE LOWER BILATERAL EXTREMITY WITHOUT CONTRAST TECHNIQUE: Multidetector CT imaging of the right lower extremity was performed according to the standard protocol. RADIATION DOSE REDUCTION: This exam was performed according to the departmental dose-optimization program which includes automated exposure control, adjustment of the mA and/or kV according to patient size and/or use of iterative reconstruction technique. COMPARISON:  CT 05/12/2022 FINDINGS: LEFT: Bones/Joint/Cartilage Demineralization. No acute fracture in the left femur, tibia, or fibula. No dislocation of the hip or knee. No knee joint effusion. Degenerative changes left hip and left knee. Ligaments Suboptimally assessed by CT. Muscles and Tendons Fatty atrophy of the musculature of the thigh and lower leg. Soft tissues Diffuse edema through the soft tissues of the left lower extremity extending from the level of the inferior along the  lateral and medial thigh answer from frontally about the knee and calf. RIGHT: Bones/Joint/Cartilage Demineralization. No acute fracture in the right femur, tibia, or fibula. No dislocation of the hip or knee. No knee joint effusion. Degenerative changes right hip and right knee. Soft tissue wound about the anterolateral proximal calf with overlying dressing material. Dressing material extends into the subcutaneous fat in the area of a gas and fluid collection which measures 5.4 x 2.0 cm in the axial plane and extends inferiorly approximately 9.1 cm. Posterior to the gas and fluid collection there is a heterogenous intermediate density collection which extends from the posterolateral distal thigh into the posterolateral proximal calf. This measures 10.1 x 3.6 cm in the axial plane and encompasses a craniocaudal dimension of 25.9 cm. Ligaments Suboptimally assessed by CT. Muscles and Tendons Fatty atrophy of the musculature of the thigh and lower leg. Soft tissues Diffuse edema through the soft tissues of the right lower extremity extending from the level of hip inferiorly along the lateral and medial thigh and circumferentially throughout the knee and calf. This is not substantially changed from 05/12/2022. Heterogenous loculated collection along the medial tibia proximally the collection measures 8.9 x 3.6 in the axial plane, previously 10.0 x 4.2 cm using similar measuring technique. This extends for a length of approximately 11.0 cm, previously 13.1 cm. The collection again demonstrates heterogenous intermediate density without definite wall thickening. Given slight decrease in size and heterogenous intermediate density this is favored to represent a hematoma. IMPRESSION: RIGHT: 1. Slightly decreased size of the heterogenous intermediate density fluid collection in the medial/proximal calf favored to represent a hematoma. Abscess or soft tissue mass are differential considerations but considered less likely. 2.  Unchanged diffuse soft tissue edema throughout the right leg. LEFT: 1. Soft tissue wound about the lateral proximal calf with overlying dressing material extend into a 5.4 x 2.0 x 9.1 cm gas and fluid collection. Differential considerations include abscess or hematoma with or without superimposed infection. 2. Additional large 10.1 x 3.6 x 25.9 cm intermediate density fluid collection in the posterolateral left leg centered about the knee consistent with hematoma. Differential considerations are soft tissue mass or abscess but these are considered less likely. 3. Diffuse soft tissue edema throughout the right leg. Electronically Signed   By: Minerva Fester M.D.   On: 06/22/2022 18:05  CT Tibia Fibula Left Wo Contrast  Result Date: 06/22/2022 CLINICAL DATA:  Lower leg trauma with swelling from mid thigh to calf with hematoma and worsening anemia. EXAM: CT OF THE LOWER BILATERAL EXTREMITY WITHOUT CONTRAST TECHNIQUE: Multidetector CT imaging of the right lower extremity was performed according to the standard protocol. RADIATION DOSE REDUCTION: This exam was performed according to the departmental dose-optimization program which includes automated exposure control, adjustment of the mA and/or kV according to patient size and/or use of iterative reconstruction technique. COMPARISON:  CT 05/12/2022 FINDINGS: LEFT: Bones/Joint/Cartilage Demineralization. No acute fracture in the left femur, tibia, or fibula. No dislocation of the hip or knee. No knee joint effusion. Degenerative changes left hip and left knee. Ligaments Suboptimally assessed by CT. Muscles and Tendons Fatty atrophy of the musculature of the thigh and lower leg. Soft tissues Diffuse edema through the soft tissues of the left lower extremity extending from the level of the inferior along the lateral and medial thigh answer from frontally about the knee and calf. RIGHT: Bones/Joint/Cartilage Demineralization. No acute fracture in the right femur, tibia,  or fibula. No dislocation of the hip or knee. No knee joint effusion. Degenerative changes right hip and right knee. Soft tissue wound about the anterolateral proximal calf with overlying dressing material. Dressing material extends into the subcutaneous fat in the area of a gas and fluid collection which measures 5.4 x 2.0 cm in the axial plane and extends inferiorly approximately 9.1 cm. Posterior to the gas and fluid collection there is a heterogenous intermediate density collection which extends from the posterolateral distal thigh into the posterolateral proximal calf. This measures 10.1 x 3.6 cm in the axial plane and encompasses a craniocaudal dimension of 25.9 cm. Ligaments Suboptimally assessed by CT. Muscles and Tendons Fatty atrophy of the musculature of the thigh and lower leg. Soft tissues Diffuse edema through the soft tissues of the right lower extremity extending from the level of hip inferiorly along the lateral and medial thigh and circumferentially throughout the knee and calf. This is not substantially changed from 05/12/2022. Heterogenous loculated collection along the medial tibia proximally the collection measures 8.9 x 3.6 in the axial plane, previously 10.0 x 4.2 cm using similar measuring technique. This extends for a length of approximately 11.0 cm, previously 13.1 cm. The collection again demonstrates heterogenous intermediate density without definite wall thickening. Given slight decrease in size and heterogenous intermediate density this is favored to represent a hematoma. IMPRESSION: RIGHT: 1. Slightly decreased size of the heterogenous intermediate density fluid collection in the medial/proximal calf favored to represent a hematoma. Abscess or soft tissue mass are differential considerations but considered less likely. 2. Unchanged diffuse soft tissue edema throughout the right leg. LEFT: 1. Soft tissue wound about the lateral proximal calf with overlying dressing material extend into  a 5.4 x 2.0 x 9.1 cm gas and fluid collection. Differential considerations include abscess or hematoma with or without superimposed infection. 2. Additional large 10.1 x 3.6 x 25.9 cm intermediate density fluid collection in the posterolateral left leg centered about the knee consistent with hematoma. Differential considerations are soft tissue mass or abscess but these are considered less likely. 3. Diffuse soft tissue edema throughout the right leg. Electronically Signed   By: Minerva Fester M.D.   On: 06/22/2022 18:05   CT FEMUR LEFT WO CONTRAST  Result Date: 06/22/2022 CLINICAL DATA:  Lower leg trauma with swelling from mid thigh to calf with hematoma and worsening anemia. EXAM: CT OF THE LOWER  BILATERAL EXTREMITY WITHOUT CONTRAST TECHNIQUE: Multidetector CT imaging of the right lower extremity was performed according to the standard protocol. RADIATION DOSE REDUCTION: This exam was performed according to the departmental dose-optimization program which includes automated exposure control, adjustment of the mA and/or kV according to patient size and/or use of iterative reconstruction technique. COMPARISON:  CT 05/12/2022 FINDINGS: LEFT: Bones/Joint/Cartilage Demineralization. No acute fracture in the left femur, tibia, or fibula. No dislocation of the hip or knee. No knee joint effusion. Degenerative changes left hip and left knee. Ligaments Suboptimally assessed by CT. Muscles and Tendons Fatty atrophy of the musculature of the thigh and lower leg. Soft tissues Diffuse edema through the soft tissues of the left lower extremity extending from the level of the inferior along the lateral and medial thigh answer from frontally about the knee and calf. RIGHT: Bones/Joint/Cartilage Demineralization. No acute fracture in the right femur, tibia, or fibula. No dislocation of the hip or knee. No knee joint effusion. Degenerative changes right hip and right knee. Soft tissue wound about the anterolateral proximal calf  with overlying dressing material. Dressing material extends into the subcutaneous fat in the area of a gas and fluid collection which measures 5.4 x 2.0 cm in the axial plane and extends inferiorly approximately 9.1 cm. Posterior to the gas and fluid collection there is a heterogenous intermediate density collection which extends from the posterolateral distal thigh into the posterolateral proximal calf. This measures 10.1 x 3.6 cm in the axial plane and encompasses a craniocaudal dimension of 25.9 cm. Ligaments Suboptimally assessed by CT. Muscles and Tendons Fatty atrophy of the musculature of the thigh and lower leg. Soft tissues Diffuse edema through the soft tissues of the right lower extremity extending from the level of hip inferiorly along the lateral and medial thigh and circumferentially throughout the knee and calf. This is not substantially changed from 05/12/2022. Heterogenous loculated collection along the medial tibia proximally the collection measures 8.9 x 3.6 in the axial plane, previously 10.0 x 4.2 cm using similar measuring technique. This extends for a length of approximately 11.0 cm, previously 13.1 cm. The collection again demonstrates heterogenous intermediate density without definite wall thickening. Given slight decrease in size and heterogenous intermediate density this is favored to represent a hematoma. IMPRESSION: RIGHT: 1. Slightly decreased size of the heterogenous intermediate density fluid collection in the medial/proximal calf favored to represent a hematoma. Abscess or soft tissue mass are differential considerations but considered less likely. 2. Unchanged diffuse soft tissue edema throughout the right leg. LEFT: 1. Soft tissue wound about the lateral proximal calf with overlying dressing material extend into a 5.4 x 2.0 x 9.1 cm gas and fluid collection. Differential considerations include abscess or hematoma with or without superimposed infection. 2. Additional large 10.1 x  3.6 x 25.9 cm intermediate density fluid collection in the posterolateral left leg centered about the knee consistent with hematoma. Differential considerations are soft tissue mass or abscess but these are considered less likely. 3. Diffuse soft tissue edema throughout the right leg. Electronically Signed   By: Minerva Fester M.D.   On: 06/22/2022 18:05

## 2022-07-07 NOTE — Assessment & Plan Note (Signed)
Recommend patient to take vitamin B12 sublingual 1000 mcg daily.  Prescription provided to patient.

## 2022-07-07 NOTE — Assessment & Plan Note (Addendum)
Recent hemoglobin decrease could be secondary to hematoma.  Chronic macrocytosis.  Lab workup results were reviewed and discussed with patient.  Possibly secondary to B12 deficiency. Iron levels were normal however he had further hemoglobin drop due to recent hematoma.  He declined repeat testing today.  I recommend patient to continue empiric iron supplementation.

## 2022-07-12 ENCOUNTER — Telehealth (INDEPENDENT_AMBULATORY_CARE_PROVIDER_SITE_OTHER): Payer: Self-pay

## 2022-07-12 ENCOUNTER — Encounter (INDEPENDENT_AMBULATORY_CARE_PROVIDER_SITE_OTHER): Payer: PPO | Admitting: Nurse Practitioner

## 2022-07-12 DIAGNOSIS — D649 Anemia, unspecified: Secondary | ICD-10-CM | POA: Diagnosis not present

## 2022-07-12 DIAGNOSIS — I509 Heart failure, unspecified: Secondary | ICD-10-CM | POA: Diagnosis not present

## 2022-07-12 DIAGNOSIS — M6281 Muscle weakness (generalized): Secondary | ICD-10-CM | POA: Diagnosis not present

## 2022-07-12 DIAGNOSIS — S31010A Laceration without foreign body of lower back and pelvis without penetration into retroperitoneum, initial encounter: Secondary | ICD-10-CM | POA: Diagnosis not present

## 2022-07-12 NOTE — Telephone Encounter (Signed)
I'm not ordering it because I haven't seen the patient

## 2022-07-12 NOTE — Telephone Encounter (Signed)
Daniel Eaton has been notified with medical recommendation and verbalized that someone from the facility will contact the office to reschedule patient appointment. Patient missed today appointment due to transportation

## 2022-07-12 NOTE — Telephone Encounter (Signed)
Larita Fife wound care nurse from [facility] left a message informing that the wound care provider saw the patient and recommended to discontinue the wound vac. The provider did not want to put the order in but prefer for the order to come from our office. Please Advise

## 2022-07-19 DIAGNOSIS — F5101 Primary insomnia: Secondary | ICD-10-CM | POA: Diagnosis not present

## 2022-07-19 DIAGNOSIS — F32A Depression, unspecified: Secondary | ICD-10-CM | POA: Diagnosis not present

## 2022-07-19 DIAGNOSIS — I509 Heart failure, unspecified: Secondary | ICD-10-CM | POA: Diagnosis not present

## 2022-07-19 DIAGNOSIS — M6281 Muscle weakness (generalized): Secondary | ICD-10-CM | POA: Diagnosis not present

## 2022-07-19 DIAGNOSIS — L97229 Non-pressure chronic ulcer of left calf with unspecified severity: Secondary | ICD-10-CM | POA: Diagnosis not present

## 2022-07-19 DIAGNOSIS — I872 Venous insufficiency (chronic) (peripheral): Secondary | ICD-10-CM | POA: Diagnosis not present

## 2022-07-26 ENCOUNTER — Encounter (INDEPENDENT_AMBULATORY_CARE_PROVIDER_SITE_OTHER): Payer: Self-pay | Admitting: Nurse Practitioner

## 2022-07-26 ENCOUNTER — Ambulatory Visit (INDEPENDENT_AMBULATORY_CARE_PROVIDER_SITE_OTHER): Payer: PPO | Admitting: Nurse Practitioner

## 2022-07-26 VITALS — BP 128/80 | HR 89 | Resp 18 | Ht 71.0 in

## 2022-07-26 DIAGNOSIS — S31010A Laceration without foreign body of lower back and pelvis without penetration into retroperitoneum, initial encounter: Secondary | ICD-10-CM | POA: Diagnosis not present

## 2022-07-26 DIAGNOSIS — I509 Heart failure, unspecified: Secondary | ICD-10-CM | POA: Diagnosis not present

## 2022-07-26 DIAGNOSIS — M6281 Muscle weakness (generalized): Secondary | ICD-10-CM | POA: Diagnosis not present

## 2022-07-26 DIAGNOSIS — S81801A Unspecified open wound, right lower leg, initial encounter: Secondary | ICD-10-CM

## 2022-07-26 DIAGNOSIS — D649 Anemia, unspecified: Secondary | ICD-10-CM | POA: Diagnosis not present

## 2022-07-26 DIAGNOSIS — L97229 Non-pressure chronic ulcer of left calf with unspecified severity: Secondary | ICD-10-CM | POA: Diagnosis not present

## 2022-07-27 NOTE — Progress Notes (Signed)
Subjective:    Patient ID: Daniel Eaton., male    DOB: 03-23-1940, 82 y.o.   MRN: [id number] Chief Complaint  Patient presents with   Follow-up    Follow up  2 weeks (07/12/2022); [facility] Discharge     The patient returns today after hematoma evacuation and debridement after a traumatic incident at his nursing facility.  He notes that he has been having some difficulty with the wound vacuum.  They have been alarming and having issues with maintaining the seal on the area.  He has 2 wounds 1 on the lateral surface of his left calf and 1 on the popliteal area.  Lateral calf area and has very good granulation tissue however the popliteal area is not as significant but progressing.  The wound itself is very shallow.  The leg itself does have some significant amount of weeping throughout however.  There does not appear to be any evidence of infection currently.    Review of Systems  Cardiovascular:  Positive for leg swelling.  Skin:  Positive for wound.  Neurological:  Positive for weakness.  All other systems reviewed and are negative.      Objective:   Physical Exam Vitals reviewed.  HENT:     Head: Normocephalic.  Cardiovascular:     Rate and Rhythm: Normal rate.     Pulses: Normal pulses.  Pulmonary:     Effort: Pulmonary effort is normal.  Skin:    General: Skin is warm and dry.       Neurological:     Mental Status: He is alert and oriented to person, place, and time.  Psychiatric:        Mood and Affect: Mood normal.        Behavior: Behavior normal.        Thought Content: Thought content normal.        Judgment: Judgment normal.     BP 128/80 (BP Location: Right Wrist)   Pulse 89   Resp 18   Ht 5\' 11"  (1.803 m)   BMI 37.82 kg/m   Past Medical History:  Diagnosis Date   A-fib (HCC)    Cellulitis    CHF (congestive heart failure) (HCC)    COPD (chronic obstructive pulmonary disease) (HCC)    Difficult intubation    Hyperlipidemia     Hypertension    Hypothyroidism    Kidney disease    Sleep apnea    Stroke [facility])    Thyroid disease     Social History   Socioeconomic History   Marital status: Divorced    Spouse name: Not on file   Number of children: Not on file   Years of education: Not on file   Highest education level: Not on file  Occupational History   Not on file  Tobacco Use   Smoking status: Former    Packs/day: 1.00    Years: 41.00    Additional pack years: 0.00    Total pack years: 41.00    Types: Cigarettes    Quit date: 63    Years since quitting: 25.4   Smokeless tobacco: Never  Vaping Use   Vaping Use: Never used  Substance and Sexual Activity   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   Drug use: No   Sexual activity: Not Currently  Other Topics Concern   Not on file  Social History Narrative   Not on file   Social Determinants of  Health   Financial Resource Strain: Not on file  Food Insecurity: No Food Insecurity (06/23/2022)   Hunger Vital Sign    Worried About Running Out of Food in the Last Year: Never true    Ran Out of Food in the Last Year: Never true  Transportation Needs: No Transportation Needs (06/23/2022)   PRAPARE - Administrator, Civil Service (Medical): No    Lack of Transportation (Non-Medical): No  Physical Activity: Not on file  Stress: Not on file  Social Connections: Not on file  Intimate Partner Violence: Not At Risk (06/23/2022)   Humiliation, Afraid, Rape, and Kick questionnaire    Fear of Current or Ex-Partner: No    Emotionally Abused: No    Physically Abused: No    Sexually Abused: No    Past Surgical History:  Procedure Laterality Date   APPLICATION OF WOUND VAC Left 06/24/2022   Procedure: APPLICATION OF WOUND VAC;  Surgeon: Renford Dills, MD;  Location: [facility];  Service: Vascular;  Laterality: Left;   APPLICATION OF WOUND VAC  06/28/2022   Procedure: APPLICATION OF WOUND VAC;  Surgeon: Renford Dills, MD;  Location:  [facility];  Service: Vascular;;   CARDIAC CATHETERIZATION     CATARACT EXTRACTION W/ INTRAOCULAR LENS  IMPLANT, BILATERAL     COLONOSCOPY  2008   HEMATOMA EVACUATION Left 06/24/2022   Procedure: EVACUATION HEMATOMA;  Surgeon: Renford Dills, MD;  Location: [facility];  Service: Vascular;  Laterality: Left;  wound vac   HERNIA REPAIR     bilateral inguinal hernia/ [place]   HERNIA REPAIR  02/02/2015   18 x 28 cm ventral light mesh placed laparoscopically.   INCISION AND DRAINAGE Left 06/24/2022   Procedure: INCISION AND DRAINAGE;  Surgeon: Renford Dills, MD;  Location: [facility];  Service: Vascular;  Laterality: Left;   TONSILLECTOMY     UMBILICAL HERNIA REPAIR N/A 02/02/2015   Procedure: HERNIA REPAIR UMBILICAL ADULT;  Surgeon: Earline Mayotte, MD;  Location: [facility];  Service: General;  Laterality: N/A;   VENTRAL HERNIA REPAIR N/A 02/02/2015   Procedure: LAPAROSCOPIC VENTRAL HERNIA;  Surgeon: Earline Mayotte, MD;  Location: [facility];  Service: General;  Laterality: N/A;   vp shunt placement  1979    Family History  Problem Relation Age of Onset   Heart disease Mother    Alcohol abuse Father    Bladder Cancer Sister     Allergies  Allergen Reactions   Diltiazem Hcl Itching and Other (See Comments)    edema       Latest Ref Rng & Units 06/28/2022    5:31 AM 06/27/2022    4:17 AM 06/26/2022    5:04 AM  CBC  WBC 4.0 - 10.5 K/uL 10.6  10.2  12.1   Hemoglobin 13.0 - 17.0 g/dL 8.1  8.3  7.5   Hematocrit 39.0 - 52.0 % 26.5  26.9  24.2   Platelets 150 - 400 K/uL 257  237  218       CMP     Component Value Date/Time   NA 135 06/28/2022 0531   NA 138 06/20/2012 0757   K 3.7 06/28/2022 0531   K 2.9 (L) 06/20/2012 0757   CL 103 06/28/2022 0531   CL 97 (L) 06/20/2012 0757   CO2 27 06/28/2022 0531   CO2 36 (H) 06/20/2012 0757   GLUCOSE 90 06/28/2022 0531   GLUCOSE 87 06/20/2012 0757   BUN 26 (H) 06/28/2022 0531  BUN 34 (H) 06/20/2012 0757   CREATININE 0.75  06/28/2022 0531   CREATININE 1.59 (H) 07/23/2013 1345   CALCIUM 7.9 (L) 06/28/2022 0531   CALCIUM 8.1 (L) 06/20/2012 0757   PROT 5.4 (L) 06/23/2022 0522   PROT 7.8 06/18/2012 1151   ALBUMIN 2.9 (L) 06/23/2022 0522   ALBUMIN 3.7 06/18/2012 1151   AST 25 06/23/2022 0522   AST 49 (H) 06/18/2012 1151   ALT 13 06/23/2022 0522   ALT 38 06/18/2012 1151   ALKPHOS 52 06/23/2022 0522   ALKPHOS 75 06/18/2012 1151   BILITOT 1.2 06/23/2022 0522   BILITOT 0.9 06/18/2012 1151   GFRNONAA >60 06/28/2022 0531   GFRNONAA 42 (L) 07/23/2013 1345     No results found.     Assessment & Plan:   1. Wound of right lower extremity, initial encounter The patient was previously had a wound vacuum but it discussion with the patient and his wife there has been some difficulty with the wound vacuum and keeping it in place.  The wound itself in the popliteal area as well as very shallow, and based on the area keeping a wound vacuum there is likely going to be difficult.  The wound on the lateral portion of his calf is also very superficial at this time.  Based on this we will remove him from the wound VAC.  Will have the patient begin to utilize Aquacel over the wounds changed every other day drainage permitting.  Will have the patient return in approximately 3 weeks for wound reevaluation.   Current Outpatient Medications on File Prior to Visit  Medication Sig Dispense Refill   acetaminophen (TYLENOL) 500 MG tablet Take 500 mg by mouth every 12 (twelve) hours.     acidophilus (RISAQUAD) CAPS capsule Take 1 capsule by mouth 2 (two) times daily.     albuterol (VENTOLIN HFA) 108 (90 Base) MCG/ACT inhaler Inhale 2 puffs into the lungs every 6 (six) hours as needed for wheezing or shortness of breath.     amiodarone (PACERONE) 200 MG tablet Take 200 mg by mouth daily.     apixaban (ELIQUIS) 5 MG TABS tablet Take 1 tablet (5 mg total) by mouth 2 (two) times daily. 60 tablet    Capsaicin (ASPERCREME PAIN RELIEF PATCH  EX) Apply topically every 12 (twelve) hours.     Carboxymethylcellulose Sod PF (REFRESH CELLUVISC) 1 % GEL Apply 1 drop to eye every 12 (twelve) hours. Right eye     Cyanocobalamin (B-12) 1000 MCG SUBL Place 1,000 mcg under the tongue daily. 90 tablet 1   ferrous sulfate 325 (65 FE) MG tablet Take 1 tablet (325 mg total) by mouth daily. 30 tablet 3   Fluticasone-Umeclidin-Vilant (TRELEGY ELLIPTA) 100-62.5-25 MCG/INH AEPB Inhale 1 puff into the lungs daily.     levothyroxine (SYNTHROID) 88 MCG tablet Take 88 mcg by mouth daily.      loratadine (CLARITIN) 10 MG tablet Take 10 mg by mouth daily.     metoprolol succinate (TOPROL-XL) 50 MG 24 hr tablet Take 50 mg by mouth daily. Take with or immediately following a meal.     Multiple Vitamin (MULTI-VITAMINS) TABS Take 1 tablet by mouth daily.     omeprazole (PRILOSEC) 20 MG capsule Take 20 mg by mouth daily.     polyethylene glycol (MIRALAX / GLYCOLAX) 17 g packet Take 17 g by mouth daily as needed. (Patient taking differently: Take 17 g by mouth daily as needed for mild constipation.) 14 each 0  potassium chloride SA (KLOR-CON M) 20 MEQ tablet Take 20 mEq by mouth daily.     senna-docusate (SENOKOT-S) 8.6-50 MG tablet Take 2 tablets by mouth 2 (two) times daily. 120 tablet 0   sertraline (ZOLOFT) 50 MG tablet Take 50 mg by mouth daily.     spironolactone (ALDACTONE) 25 MG tablet Take 25 mg by mouth daily.     torsemide (DEMADEX) 20 MG tablet Hold pending outpatient followup due to acute kidney injury. (Patient taking differently: Take 20 mg by mouth daily.) 30 tablet 0   traZODone (DESYREL) 50 MG tablet Take 25 mg by mouth at bedtime.     No current facility-administered medications on file prior to visit.    There are no Patient Instructions on file for this visit. No follow-ups on file.   Georgiana Spinner, NP

## 2022-08-02 DIAGNOSIS — L97812 Non-pressure chronic ulcer of other part of right lower leg with fat layer exposed: Secondary | ICD-10-CM | POA: Diagnosis not present

## 2022-08-02 DIAGNOSIS — M6281 Muscle weakness (generalized): Secondary | ICD-10-CM | POA: Diagnosis not present

## 2022-08-02 DIAGNOSIS — I509 Heart failure, unspecified: Secondary | ICD-10-CM | POA: Diagnosis not present

## 2022-08-02 DIAGNOSIS — I87311 Chronic venous hypertension (idiopathic) with ulcer of right lower extremity: Secondary | ICD-10-CM | POA: Diagnosis not present

## 2022-08-04 DIAGNOSIS — I739 Peripheral vascular disease, unspecified: Secondary | ICD-10-CM | POA: Diagnosis not present

## 2022-08-04 DIAGNOSIS — L603 Nail dystrophy: Secondary | ICD-10-CM | POA: Diagnosis not present

## 2022-08-08 ENCOUNTER — Inpatient Hospital Stay
Admission: EM | Admit: 2022-08-08 | Discharge: 2022-08-17 | DRG: 698 | Disposition: A | Discharge status: Skilled Nursing Facility | Payer: PPO | Source: Skilled Nursing Facility | Attending: Internal Medicine | Admitting: Internal Medicine

## 2022-08-08 ENCOUNTER — Emergency Department: Payer: PPO

## 2022-08-08 ENCOUNTER — Other Ambulatory Visit: Payer: Self-pay

## 2022-08-08 DIAGNOSIS — Z7951 Long term (current) use of inhaled steroids: Secondary | ICD-10-CM

## 2022-08-08 DIAGNOSIS — E872 Acidosis, unspecified: Secondary | ICD-10-CM | POA: Diagnosis not present

## 2022-08-08 DIAGNOSIS — I517 Cardiomegaly: Secondary | ICD-10-CM | POA: Diagnosis not present

## 2022-08-08 DIAGNOSIS — I739 Peripheral vascular disease, unspecified: Secondary | ICD-10-CM | POA: Diagnosis not present

## 2022-08-08 DIAGNOSIS — Y846 Urinary catheterization as the cause of abnormal reaction of the patient, or of later complication, without mention of misadventure at the time of the procedure: Secondary | ICD-10-CM | POA: Diagnosis present

## 2022-08-08 DIAGNOSIS — E785 Hyperlipidemia, unspecified: Secondary | ICD-10-CM | POA: Diagnosis present

## 2022-08-08 DIAGNOSIS — Z87891 Personal history of nicotine dependence: Secondary | ICD-10-CM

## 2022-08-08 DIAGNOSIS — Z79899 Other long term (current) drug therapy: Secondary | ICD-10-CM

## 2022-08-08 DIAGNOSIS — R609 Edema, unspecified: Secondary | ICD-10-CM | POA: Diagnosis not present

## 2022-08-08 DIAGNOSIS — S8012XA Contusion of left lower leg, initial encounter: Secondary | ICD-10-CM | POA: Diagnosis present

## 2022-08-08 DIAGNOSIS — A419 Sepsis, unspecified organism: Secondary | ICD-10-CM | POA: Diagnosis present

## 2022-08-08 DIAGNOSIS — I10 Essential (primary) hypertension: Secondary | ICD-10-CM | POA: Diagnosis present

## 2022-08-08 DIAGNOSIS — L03115 Cellulitis of right lower limb: Secondary | ICD-10-CM | POA: Diagnosis present

## 2022-08-08 DIAGNOSIS — B961 Klebsiella pneumoniae [K. pneumoniae] as the cause of diseases classified elsewhere: Secondary | ICD-10-CM

## 2022-08-08 DIAGNOSIS — R0989 Other specified symptoms and signs involving the circulatory and respiratory systems: Secondary | ICD-10-CM | POA: Diagnosis not present

## 2022-08-08 DIAGNOSIS — J811 Chronic pulmonary edema: Secondary | ICD-10-CM | POA: Diagnosis not present

## 2022-08-08 DIAGNOSIS — Z1612 Extended spectrum beta lactamase (ESBL) resistance: Secondary | ICD-10-CM | POA: Diagnosis not present

## 2022-08-08 DIAGNOSIS — M6281 Muscle weakness (generalized): Secondary | ICD-10-CM | POA: Diagnosis not present

## 2022-08-08 DIAGNOSIS — A4151 Sepsis due to Escherichia coli [E. coli]: Secondary | ICD-10-CM | POA: Diagnosis present

## 2022-08-08 DIAGNOSIS — J441 Chronic obstructive pulmonary disease with (acute) exacerbation: Secondary | ICD-10-CM | POA: Diagnosis not present

## 2022-08-08 DIAGNOSIS — N189 Chronic kidney disease, unspecified: Secondary | ICD-10-CM | POA: Diagnosis not present

## 2022-08-08 DIAGNOSIS — S81801A Unspecified open wound, right lower leg, initial encounter: Secondary | ICD-10-CM | POA: Diagnosis not present

## 2022-08-08 DIAGNOSIS — E538 Deficiency of other specified B group vitamins: Secondary | ICD-10-CM | POA: Diagnosis present

## 2022-08-08 DIAGNOSIS — R7881 Bacteremia: Secondary | ICD-10-CM

## 2022-08-08 DIAGNOSIS — R319 Hematuria, unspecified: Secondary | ICD-10-CM | POA: Diagnosis not present

## 2022-08-08 DIAGNOSIS — R69 Illness, unspecified: Secondary | ICD-10-CM | POA: Diagnosis not present

## 2022-08-08 DIAGNOSIS — R531 Weakness: Secondary | ICD-10-CM | POA: Diagnosis not present

## 2022-08-08 DIAGNOSIS — S81802A Unspecified open wound, left lower leg, initial encounter: Secondary | ICD-10-CM | POA: Diagnosis not present

## 2022-08-08 DIAGNOSIS — A499 Bacterial infection, unspecified: Secondary | ICD-10-CM

## 2022-08-08 DIAGNOSIS — D539 Nutritional anemia, unspecified: Secondary | ICD-10-CM | POA: Diagnosis not present

## 2022-08-08 DIAGNOSIS — E669 Obesity, unspecified: Secondary | ICD-10-CM | POA: Diagnosis not present

## 2022-08-08 DIAGNOSIS — E162 Hypoglycemia, unspecified: Secondary | ICD-10-CM | POA: Diagnosis not present

## 2022-08-08 DIAGNOSIS — I4891 Unspecified atrial fibrillation: Secondary | ICD-10-CM | POA: Diagnosis not present

## 2022-08-08 DIAGNOSIS — I5022 Chronic systolic (congestive) heart failure: Secondary | ICD-10-CM | POA: Diagnosis not present

## 2022-08-08 DIAGNOSIS — L89896 Pressure-induced deep tissue damage of other site: Secondary | ICD-10-CM | POA: Diagnosis present

## 2022-08-08 DIAGNOSIS — Z8614 Personal history of Methicillin resistant Staphylococcus aureus infection: Secondary | ICD-10-CM

## 2022-08-08 DIAGNOSIS — N179 Acute kidney failure, unspecified: Secondary | ICD-10-CM | POA: Diagnosis not present

## 2022-08-08 DIAGNOSIS — Z6833 Body mass index (BMI) 33.0-33.9, adult: Secondary | ICD-10-CM

## 2022-08-08 DIAGNOSIS — N390 Urinary tract infection, site not specified: Secondary | ICD-10-CM

## 2022-08-08 DIAGNOSIS — Z8249 Family history of ischemic heart disease and other diseases of the circulatory system: Secondary | ICD-10-CM

## 2022-08-08 DIAGNOSIS — B9620 Unspecified Escherichia coli [E. coli] as the cause of diseases classified elsewhere: Secondary | ICD-10-CM | POA: Diagnosis not present

## 2022-08-08 DIAGNOSIS — E039 Hypothyroidism, unspecified: Secondary | ICD-10-CM | POA: Diagnosis present

## 2022-08-08 DIAGNOSIS — I429 Cardiomyopathy, unspecified: Secondary | ICD-10-CM

## 2022-08-08 DIAGNOSIS — Z7901 Long term (current) use of anticoagulants: Secondary | ICD-10-CM

## 2022-08-08 DIAGNOSIS — M19011 Primary osteoarthritis, right shoulder: Secondary | ICD-10-CM | POA: Diagnosis present

## 2022-08-08 DIAGNOSIS — I959 Hypotension, unspecified: Secondary | ICD-10-CM | POA: Diagnosis not present

## 2022-08-08 DIAGNOSIS — K5730 Diverticulosis of large intestine without perforation or abscess without bleeding: Secondary | ICD-10-CM | POA: Diagnosis not present

## 2022-08-08 DIAGNOSIS — T83511A Infection and inflammatory reaction due to indwelling urethral catheter, initial encounter: Principal | ICD-10-CM | POA: Diagnosis present

## 2022-08-08 DIAGNOSIS — R680 Hypothermia, not associated with low environmental temperature: Secondary | ICD-10-CM | POA: Diagnosis present

## 2022-08-08 DIAGNOSIS — Z888 Allergy status to other drugs, medicaments and biological substances status: Secondary | ICD-10-CM

## 2022-08-08 DIAGNOSIS — Z79890 Hormone replacement therapy (postmenopausal): Secondary | ICD-10-CM

## 2022-08-08 DIAGNOSIS — I130 Hypertensive heart and chronic kidney disease with heart failure and stage 1 through stage 4 chronic kidney disease, or unspecified chronic kidney disease: Secondary | ICD-10-CM | POA: Diagnosis not present

## 2022-08-08 DIAGNOSIS — G4730 Sleep apnea, unspecified: Secondary | ICD-10-CM | POA: Diagnosis present

## 2022-08-08 DIAGNOSIS — R6521 Severe sepsis with septic shock: Secondary | ICD-10-CM | POA: Diagnosis present

## 2022-08-08 DIAGNOSIS — N1831 Chronic kidney disease, stage 3a: Secondary | ICD-10-CM | POA: Diagnosis present

## 2022-08-08 DIAGNOSIS — Z8673 Personal history of transient ischemic attack (TIA), and cerebral infarction without residual deficits: Secondary | ICD-10-CM

## 2022-08-08 DIAGNOSIS — I129 Hypertensive chronic kidney disease with stage 1 through stage 4 chronic kidney disease, or unspecified chronic kidney disease: Secondary | ICD-10-CM | POA: Diagnosis not present

## 2022-08-08 DIAGNOSIS — I480 Paroxysmal atrial fibrillation: Secondary | ICD-10-CM | POA: Diagnosis not present

## 2022-08-08 DIAGNOSIS — D72829 Elevated white blood cell count, unspecified: Secondary | ICD-10-CM

## 2022-08-08 DIAGNOSIS — Z961 Presence of intraocular lens: Secondary | ICD-10-CM | POA: Diagnosis present

## 2022-08-08 DIAGNOSIS — G4733 Obstructive sleep apnea (adult) (pediatric): Secondary | ICD-10-CM | POA: Diagnosis not present

## 2022-08-08 DIAGNOSIS — Z7401 Bed confinement status: Secondary | ICD-10-CM

## 2022-08-08 DIAGNOSIS — I491 Atrial premature depolarization: Secondary | ICD-10-CM | POA: Diagnosis not present

## 2022-08-08 DIAGNOSIS — I499 Cardiac arrhythmia, unspecified: Secondary | ICD-10-CM | POA: Diagnosis not present

## 2022-08-08 DIAGNOSIS — Z743 Need for continuous supervision: Secondary | ICD-10-CM | POA: Diagnosis not present

## 2022-08-08 LAB — COMPREHENSIVE METABOLIC PANEL
ALT: 14 U/L (ref 0–44)
ALT: 11 U/L (ref 0–44)
AST: 21 U/L (ref 15–41)
AST: 25 U/L (ref 15–41)
Albumin: 1.9 g/dL — ABNORMAL LOW (ref 3.5–5.0)
Albumin: 1.8 g/dL — ABNORMAL LOW (ref 3.5–5.0)
Alkaline Phosphatase: 101 U/L (ref 38–126)
Alkaline Phosphatase: 106 U/L (ref 38–126)
Anion gap: 9 (ref 5–15)
Anion gap: 11 (ref 5–15)
BUN: 19 mg/dL (ref 8–23)
BUN: 19 mg/dL (ref 8–23)
CO2: 22 mmol/L (ref 22–32)
CO2: 20 mmol/L — ABNORMAL LOW (ref 22–32)
Calcium: 8.2 mg/dL — ABNORMAL LOW (ref 8.9–10.3)
Calcium: 7.8 mg/dL — ABNORMAL LOW (ref 8.9–10.3)
Chloride: 106 mmol/L (ref 98–111)
Chloride: 105 mmol/L (ref 98–111)
Creatinine, Ser: 1.84 mg/dL — ABNORMAL HIGH (ref 0.61–1.24)
Creatinine, Ser: 1.7 mg/dL — ABNORMAL HIGH (ref 0.61–1.24)
GFR, Estimated: 36 mL/min — ABNORMAL LOW (ref >60)
GFR, Estimated: 40 mL/min — ABNORMAL LOW (ref >60)
Glucose, Bld: 106 mg/dL — ABNORMAL HIGH (ref 70–99)
Glucose, Bld: 64 mg/dL — ABNORMAL LOW (ref 70–99)
Potassium: 4.3 mmol/L (ref 3.5–5.1)
Potassium: 4.6 mmol/L (ref 3.5–5.1)
Sodium: 137 mmol/L (ref 135–145)
Sodium: 136 mmol/L (ref 135–145)
Total Bilirubin: 0.8 mg/dL (ref 0.3–1.2)
Total Bilirubin: 0.8 mg/dL (ref 0.3–1.2)
Total Protein: 5.4 g/dL — ABNORMAL LOW (ref 6.5–8.1)
Total Protein: 5 g/dL — ABNORMAL LOW (ref 6.5–8.1)

## 2022-08-08 LAB — CBC WITH DIFFERENTIAL/PLATELET
Abs Immature Granulocytes: 0.33 10*3/uL — ABNORMAL HIGH (ref 0.00–0.07)
Basophils Absolute: 0.1 10*3/uL (ref 0.0–0.1)
Basophils Relative: 0 %
Eosinophils Absolute: 0.2 10*3/uL (ref 0.0–0.5)
Eosinophils Relative: 1 %
HCT: 36.1 % — ABNORMAL LOW (ref 39.0–52.0)
Hemoglobin: 11 g/dL — ABNORMAL LOW (ref 13.0–17.0)
Immature Granulocytes: 1 %
Lymphocytes Relative: 4 %
Lymphs Abs: 1.2 10*3/uL (ref 0.7–4.0)
MCH: 31.1 pg (ref 26.0–34.0)
MCHC: 30.5 g/dL (ref 30.0–36.0)
MCV: 102 fL — ABNORMAL HIGH (ref 80.0–100.0)
Monocytes Absolute: 0.7 10*3/uL (ref 0.1–1.0)
Monocytes Relative: 2 %
Neutro Abs: 28.7 10*3/uL — ABNORMAL HIGH (ref 1.7–7.7)
Neutrophils Relative %: 92 %
Platelets: 450 10*3/uL — ABNORMAL HIGH (ref 150–400)
RBC: 3.54 MIL/uL — ABNORMAL LOW (ref 4.22–5.81)
RDW: 21.1 % — ABNORMAL HIGH (ref 11.5–15.5)
Smear Review: NORMAL
WBC: 31.2 10*3/uL — ABNORMAL HIGH (ref 4.0–10.5)
nRBC: 0 % (ref 0.0–0.2)

## 2022-08-08 LAB — URINALYSIS, COMPLETE (UACMP) WITH MICROSCOPIC
Bilirubin Urine: NEGATIVE
Glucose, UA: NEGATIVE mg/dL
Ketones, ur: NEGATIVE mg/dL
Nitrite: NEGATIVE
Protein, ur: >=300 mg/dL — AB
Specific Gravity, Urine: 1.008 (ref 1.005–1.030)
Squamous Epithelial / HPF: NONE SEEN /HPF (ref 0–5)
WBC, UA: >50 WBC/hpf (ref 0–5)
pH: 5 (ref 5.0–8.0)

## 2022-08-08 LAB — CK: Total CK: 60 U/L (ref 49–397)

## 2022-08-08 LAB — LACTIC ACID, PLASMA
Lactic Acid, Venous: 2 mmol/L (ref 0.5–1.9)
Lactic Acid, Venous: >9 mmol/L (ref 0.5–1.9)
Lactic Acid, Venous: 5.4 mmol/L (ref 0.5–1.9)
Lactic Acid, Venous: 3.8 mmol/L (ref 0.5–1.9)

## 2022-08-08 LAB — MAGNESIUM: Magnesium: 1.9 mg/dL (ref 1.7–2.4)

## 2022-08-08 MED ORDER — LACTATED RINGERS IV SOLN: INTRAVENOUS | Status: AC

## 2022-08-08 MED ORDER — LACTATED RINGERS IV BOLUS
2000.0000 mL | Freq: Once | INTRAVENOUS | Status: AC
  Administered 2022-08-08: 2000 mL via INTRAVENOUS

## 2022-08-08 MED ORDER — VANCOMYCIN HCL IN DEXTROSE 1-5 GM/200ML-% IV SOLN
1000.0000 mg | Freq: Once | INTRAVENOUS | Status: AC
  Administered 2022-08-08: 1000 mg via INTRAVENOUS
  Filled 2022-08-08: qty 200

## 2022-08-08 MED ORDER — SODIUM CHLORIDE 0.9 % IV SOLN
2.0000 g | Freq: Once | INTRAVENOUS | Status: AC
  Administered 2022-08-08: 2 g via INTRAVENOUS
  Filled 2022-08-08: qty 12.5

## 2022-08-08 MED ORDER — LEVOTHYROXINE SODIUM 88 MCG PO TABS
88.0000 ug | ORAL_TABLET | Freq: Every day | ORAL | Status: DC
  Administered 2022-08-09 – 2022-08-17 (×9): 88 ug via ORAL
  Filled 2022-08-08 (×9): qty 1

## 2022-08-08 MED ORDER — LIDOCAINE HCL URETHRAL/MUCOSAL 2 % EX GEL
1.0000 | Freq: Once | CUTANEOUS | Status: AC
  Administered 2022-08-08: 1 via URETHRAL
  Filled 2022-08-08: qty 10

## 2022-08-08 MED ORDER — LACTATED RINGERS IV BOLUS
1000.0000 mL | Freq: Once | INTRAVENOUS | Status: AC
  Administered 2022-08-08: 1000 mL via INTRAVENOUS

## 2022-08-08 MED ORDER — SODIUM CHLORIDE 0.9 % IV SOLN
2.0000 g | Freq: Three times a day (TID) | INTRAVENOUS | Status: DC
  Administered 2022-08-09: 2 g via INTRAVENOUS
  Filled 2022-08-08: qty 12.5

## 2022-08-08 MED ORDER — PANTOPRAZOLE SODIUM 40 MG IV SOLR
40.0000 mg | Freq: Once | INTRAVENOUS | Status: AC
  Administered 2022-08-08: 40 mg via INTRAVENOUS
  Filled 2022-08-08: qty 10

## 2022-08-08 MED ORDER — AMIODARONE HCL 200 MG PO TABS
200.0000 mg | ORAL_TABLET | Freq: Every morning | ORAL | Status: DC
  Administered 2022-08-09 – 2022-08-17 (×9): 200 mg via ORAL
  Filled 2022-08-08 (×9): qty 1

## 2022-08-08 MED ORDER — SERTRALINE HCL 50 MG PO TABS
50.0000 mg | ORAL_TABLET | Freq: Every day | ORAL | Status: DC
  Administered 2022-08-08 – 2022-08-16 (×9): 50 mg via ORAL
  Filled 2022-08-08 (×9): qty 1

## 2022-08-08 MED ORDER — VANCOMYCIN HCL IN DEXTROSE 1-5 GM/200ML-% IV SOLN: 1000.0000 mg | INTRAVENOUS | Status: DC

## 2022-08-08 MED ORDER — LACTATED RINGERS IV SOLN: INTRAVENOUS | Status: DC

## 2022-08-08 NOTE — ED Triage Notes (Signed)
Pt comes in from [facility] via [facility] with complaints of abnormal labs. According to EMS, pt had a WBC of 28, and CR of 1.55. Pt has no complaints of pain at this time, although he says when he moves his left leg, he has pain. Pt has a red spot on his left upper arm, and discoloration to his left lower leg. Pt apparently had a wound vac removed from a wound in the area at some point this week. Pt is alert and oriented x4, and with no signs of distress at this time.

## 2022-08-08 NOTE — ED Provider Notes (Signed)
Emergency department handoff note  Care of this patient was signed out to me at the end of the previous provider shift.  All pertinent patient information was conveyed and all questions were answered.  Patient pending urinalysis for potential source for patient's sepsis.  Urine does show evidence of infection at this time and Foley catheter exchanged.  Given the patient shows signs of of sepsis likely secondary urinary tract infection, he will require admission to the internal medicine service for further evaluation and management.  I spoke to Dr. Allena Katz in the hospital service who agrees with plan for admission for further evaluation and management.  Dispo: Admit to medicine   Merwyn Katos, MD 08/08/22 2007

## 2022-08-08 NOTE — Progress Notes (Signed)
Pharmacy Antibiotic Note  Daniel Eaton. is a 82 y.o. male admitted on 08/08/2022 with sepsis.  Pharmacy has been consulted for vancomycin and cefepime dosing.  Plan:  1) start cefepime 2 grams IV every 8 hours  2) start vancomycin 2000 mg IV x 1 then 1000 mg every 24 hours Goal AUC 400-550. Expected AUC: 571 SCr used: 1.84 mg/dL   Height: 5\' 11"  (180.3 cm) Weight: 108.9 kg (240 lb) IBW/kg (Calculated) : 75.3  Temp (24hrs), Avg:97.7 F (36.5 C), Min:97.5 F (36.4 C), Max:97.9 F (36.6 C)  Recent Labs  Lab 08/08/22 1415 08/08/22 1720 08/08/22 1918  WBC 31.2*  --   --   CREATININE 1.84*  --   --   LATICACIDVEN 2.0* >9.0* 5.4*    Estimated Creatinine Clearance: 38.8 mL/min (A) (by C-G formula based on SCr of 1.84 mg/dL (H)).    Allergies  Allergen Reactions   Diltiazem Hcl Itching and Other (See Comments)    edema    Antimicrobials this admission: 06/24 vancomycin >>  06/24 cefepime >>   Microbiology results: 06/24 BCx: pending 06/24 MRSA PCR: pending  Thank you for allowing pharmacy to be a part of this patient's care.  Lowella Bandy 08/08/2022 8:11 PM

## 2022-08-08 NOTE — ED Provider Notes (Signed)
[facility] Provider Note    Event Date/Time   First MD Initiated Contact with Patient 08/08/22 1349     (approximate)   History   Abnormal Lab   HPI  Daniel Hemp. is a 82 y.o. male presents to the emergency department today because of concerns for abnormal blood work found at facility.  Somewhat unclear why facility checked the blood work.  Patient states they were concerned about a spot on his left arm.  He however says that the spot has been there for months.  Additionally has history of wounds to legs.  He feels these are improving.  Patient denies any recent fevers.  Denies any shortness of breath or chest pain.     Physical Exam   Triage Vital Signs: ED Triage Vitals  Enc Vitals Group     BP 08/08/22 1331 (!) 84/45     Pulse Rate 08/08/22 1331 98     Resp 08/08/22 1331 18     Temp 08/08/22 1331 (!) 97.5 F (36.4 C)     Temp src --      SpO2 08/08/22 1331 95 %     Weight 08/08/22 1440 240 lb (108.9 kg)     Height 08/08/22 1440 5\' 11"  (1.803 m)     Head Circumference --      Peak Flow --      Pain Score 08/08/22 1332 0     Pain Loc --      Pain Edu? --      Excl. in GC? --     Most recent vital signs: Vitals:   08/08/22 1445 08/08/22 1500  BP: (!) 87/74 (!) 81/49  Pulse: 83 93  Resp: 19 20  Temp:    SpO2: 97% 99%   General: Awake, alert, oriented. CV:  Good peripheral perfusion. Regular rate and rhythm. Resp:  Normal effort.  Abd:  No distention.    ED Results / Procedures / Treatments   Labs (all labs ordered are listed, but only abnormal results are displayed) Labs Reviewed  LACTIC ACID, PLASMA - Abnormal; Notable for the following components:      Result Value   Lactic Acid, Venous 2.0 (*)    All other components within normal limits  COMPREHENSIVE METABOLIC PANEL - Abnormal; Notable for the following components:   Glucose, Bld 106 (*)    Creatinine, Ser 1.84 (*)    Calcium 8.2 (*)    Total Protein 5.4 (*)     Albumin 1.9 (*)    GFR, Estimated 36 (*)    All other components within normal limits  CBC WITH DIFFERENTIAL/PLATELET - Abnormal; Notable for the following components:   WBC 31.2 (*)    RBC 3.54 (*)    Hemoglobin 11.0 (*)    HCT 36.1 (*)    MCV 102.0 (*)    RDW 21.1 (*)    Platelets 450 (*)    Neutro Abs 28.7 (*)    Abs Immature Granulocytes 0.33 (*)    All other components within normal limits  CULTURE, BLOOD (ROUTINE X 2)  CULTURE, BLOOD (ROUTINE X 2)  LACTIC ACID, PLASMA  URINALYSIS, COMPLETE (UACMP) WITH MICROSCOPIC     EKG  I, Phineas Semen, attending physician, personally viewed and interpreted this EKG  EKG Time: 1334 Rate: 92 Rhythm: atrial fibrillation Axis: normal Intervals: qtc 445 QRS: narrow ST changes: no st elevation Impression: abnormal ekg    RADIOLOGY I independently interpreted and visualized the CXR.  My interpretation: No pneumonia Radiology interpretation:  IMPRESSION:  Cardiomegaly with pulmonary vascular congestion and mild  interstitial edema.     PROCEDURES:  Critical Care performed: Yes  CRITICAL CARE Performed by: Phineas Semen   Total critical care time: 30 minutes  Critical care time was exclusive of separately billable procedures and treating other patients.  Critical care was necessary to treat or prevent imminent or life-threatening deterioration.  Critical care was time spent personally by me on the following activities: development of treatment plan with patient and/or surrogate as well as nursing, discussions with consultants, evaluation of patient's response to treatment, examination of patient, obtaining history from patient or surrogate, ordering and performing treatments and interventions, ordering and review of laboratory studies, ordering and review of radiographic studies, pulse oximetry and re-evaluation of patient's condition.   Procedures    MEDICATIONS ORDERED IN ED: Medications  ceFEPIme (MAXIPIME) 2  g in sodium chloride 0.9 % 100 mL IVPB (2 g Intravenous New Bag/Given 08/08/22 1519)  vancomycin (VANCOCIN) IVPB 1000 mg/200 mL premix (has no administration in time range)  lidocaine (XYLOCAINE) 2 % jelly 1 Application (has no administration in time range)  lactated ringers bolus 1,000 mL (1,000 mLs Intravenous New Bag/Given 08/08/22 1440)  lactated ringers bolus 2,000 mL (2,000 mLs Intravenous New Bag/Given 08/08/22 1519)     IMPRESSION / MDM / ASSESSMENT AND PLAN / ED COURSE  I reviewed the triage vital signs and the nursing notes.                              Differential diagnosis includes, but is not limited to, cellulitis, wound infection, pneumonia  Patient's presentation is most consistent with acute presentation with potential threat to life or bodily function.   The patient is on the cardiac monitor to evaluate for evidence of arrhythmia and/or significant heart rate changes.  Patient presented to the emergency department today because of concerns for abnormal lab work from facility.  Per report it was elevation of creatinine and white blood cell.  On exam patient has slight redness to the left elbow.  Blood work here is concerning for leukocytosis.  Patient also has a very mild lactic acidosis.  Additionally patient's vital signs were concerning for hypotension.  Did have concern for significant infection.  30 cc/kg IV fluid bolus was ordered.  Additionally broad-spectrum antibiotics were ordered.  Awaiting further results at time of signout.      FINAL CLINICAL IMPRESSION(S) / ED DIAGNOSES   Final diagnoses:  Hypotension, unspecified hypotension type  Leukocytosis, unspecified type     Note:  This document was prepared using Dragon voice recognition software and may include unintentional dictation errors.    Phineas Semen, MD 08/08/22 1540

## 2022-08-08 NOTE — H&P (Addendum)
History and Physical    Patient: Daniel Eaton. WGN:[id number] DOB: 1940/09/27 DOA: 08/08/2022 DOS: the patient was seen and examined on 08/08/2022 PCP: Lauro Regulus, MD  Patient coming from: [facility]  Chief Complaint:  Chief Complaint  Patient presents with   Abnormal Lab    HPI: Daniel Eaton. is a 82 y.o. male with medical history significant for abnormal labs with leukocytosis of 28,000 and renal failure creatinine of 1.55 patient initially did not report any complaints Per RN note but pain in his left leg when he moves it.  Patient is closely followed by vascular and had a wound VAC removed on 11 June.  Today he is not ill-appearing gentleman with fair prognosis due to multiple comorbidities, bedridden state, he is oriented and alert but limited historian.  He does state that when he saw what came out of him in his urine it was nasty. On arrival patient did have a Foley catheter in place it is unclear from chart review how long he has had it and when it was placed.  Per ED provider Foley changed in the emergency room.  Patient meets severe sepsis with septic shock criteria on arrival is hypotensive, has responded to IV fluids and his blood pressure has stabilized with a systolic of 120 however his lactic acid has gone up from 2-9 with a repeat value still pending.  I suspect that is a true value as patient most likely has infection sepsis, bacteremia.  I do not suspect the source to be just the urine we will evaluate the patient CT abdomen and pelvis and CT of his lower extremities.  On exam I do not feel a pulse in his legs and his legs are very thin contrary to what I would expect is suspect he has PAD.  Requested Dopplers to ED MD.  Initial blood work shows leukocytosis of 31.2 anemia of 11 platelet count of 450 MCV of 102 and RDW of 21, blood and urine cultures collected in the emergency room, CMP shows AKI of 1.84 albumin of 1.9 initial lactic of 2 point 9 repeat as mentioned  above is 9.0.  Initial EKG shows A-fib with a QTc of 455. In the emergency room patient received LR total 3 L, vancomycin and cefepime along with Protonix.  Echocardiogram in 2021 shows EF of 35 to 40% right ventricular systolic function is also moderately reduced dilated by atria, suspect secondary underlying sleep apnea.  Review of Systems: Review of Systems  Genitourinary:        Abnormal urinalysis.  Musculoskeletal:        Leg pain.  Neurological:  Positive for weakness.  All other systems reviewed and are negative.    Past Medical History:  Diagnosis Date   A-fib (HCC)    Cellulitis    CHF (congestive heart failure) (HCC)    COPD (chronic obstructive pulmonary disease) (HCC)    Difficult intubation    Hyperlipidemia    Hypertension    Hypothyroidism    Kidney disease    Sleep apnea    Stroke [facility])    Thyroid disease    Past Surgical History:  Procedure Laterality Date   APPLICATION OF WOUND VAC Left 06/24/2022   Procedure: APPLICATION OF WOUND VAC;  Surgeon: Renford Dills, MD;  Location: [facility] ORS;  Service: Vascular;  Laterality: Left;   APPLICATION OF WOUND VAC  06/28/2022   Procedure: APPLICATION OF WOUND VAC;  Surgeon: Renford Dills, MD;  Location:  [facility] ORS;  Service: Vascular;;   CARDIAC CATHETERIZATION     CATARACT EXTRACTION W/ INTRAOCULAR LENS  IMPLANT, BILATERAL     COLONOSCOPY  2008   HEMATOMA EVACUATION Left 06/24/2022   Procedure: EVACUATION HEMATOMA;  Surgeon: Renford Dills, MD;  Location: [facility] ORS;  Service: Vascular;  Laterality: Left;  wound vac   HERNIA REPAIR     bilateral inguinal hernia/ [place]   HERNIA REPAIR  02/02/2015   18 x 28 cm ventral light mesh placed laparoscopically.   INCISION AND DRAINAGE Left 06/24/2022   Procedure: INCISION AND DRAINAGE;  Surgeon: Renford Dills, MD;  Location: [facility] ORS;  Service: Vascular;  Laterality: Left;   TONSILLECTOMY     UMBILICAL HERNIA REPAIR N/A 02/02/2015   Procedure: HERNIA REPAIR  UMBILICAL ADULT;  Surgeon: Earline Mayotte, MD;  Location: [facility] ORS;  Service: General;  Laterality: N/A;   VENTRAL HERNIA REPAIR N/A 02/02/2015   Procedure: LAPAROSCOPIC VENTRAL HERNIA;  Surgeon: Earline Mayotte, MD;  Location: [facility] ORS;  Service: General;  Laterality: N/A;   vp shunt placement  1979   Social History:  reports that he quit smoking about 25 years ago. His smoking use included cigarettes. He has a 41.00 pack-year smoking history. He has never used smokeless tobacco. He reports that he does not drink alcohol and does not use drugs.  Allergies  Allergen Reactions   Diltiazem Hcl Itching and Other (See Comments)    edema    Family History  Problem Relation Age of Onset   Heart disease Mother    Alcohol abuse Father    Bladder Cancer Sister     Prior to Admission medications   Medication Sig Start Date End Date Taking? Authorizing Provider  acetaminophen (TYLENOL) 500 MG tablet Take 500 mg by mouth every 12 (twelve) hours.   Yes [provider]  albuterol (VENTOLIN HFA) 108 (90 Base) MCG/ACT inhaler Inhale 2 puffs into the lungs every 6 (six) hours as needed for wheezing or shortness of breath. 02/17/21  Yes Darlin Priestly, MD  alum & mag hydroxide-simeth (MAALOX/MYLANTA) 200-200-20 MG/5ML suspension Take 30 mLs by mouth every 6 (six) hours as needed for indigestion or heartburn.   Yes [provider]  amiodarone (PACERONE) 200 MG tablet Take 200 mg by mouth every morning.   Yes [provider]  Capsaicin (ASPERCREME PAIN RELIEF PATCH EX) Apply 1 patch topically every 12 (twelve) hours. (Apply to right shoulder - 12 hours on/12 hours off)   Yes [provider]  Carboxymethylcellulose Sod PF (REFRESH CELLUVISC) 1 % GEL Place 1 drop into the right eye every 12 (twelve) hours.   Yes [provider]  Cyanocobalamin 1000 MCG/ML LIQD Take 1,000 mcg by mouth daily.   Yes [provider]  ferrous sulfate 325 (65 FE) MG tablet Take 1  tablet (325 mg total) by mouth daily. 12/09/21  Yes Chesley Noon, MD  fluticasone [facility]) 50 MCG/ACT nasal spray Place 2 sprays into both nostrils daily.   Yes [provider]  Fluticasone-Umeclidin-Vilant (TRELEGY ELLIPTA) 100-62.5-25 MCG/INH AEPB Inhale 1 puff into the lungs daily.   Yes [provider]  HYDROcodone-acetaminophen (NORCO/VICODIN) 5-325 MG tablet Take 1 tablet by mouth every 4 (four) hours as needed for moderate pain.   Yes [provider]  levothyroxine (SYNTHROID) 88 MCG tablet Take 88 mcg by mouth daily.    Yes [provider]  loratadine (CLARITIN) 10 MG tablet Take 10 mg by mouth daily.   Yes [provider]  Menthol, Topical Analgesic, (GOLD BOND EXTRA STRENGTH) 0.8 % POWD Apply 1 application  topically 2 (two) times daily. (Apply to back)   Yes [provider]  metoprolol succinate (TOPROL-XL) 50 MG 24 hr tablet Take 50 mg by mouth daily.   Yes [provider]  Multiple Vitamin (MULTI-VITAMINS) TABS Take 1 tablet by mouth daily.   Yes [provider]  omeprazole (PRILOSEC) 20 MG capsule Take 20 mg by mouth daily before supper.   Yes [provider]  polyethylene glycol (MIRALAX / GLYCOLAX) 17 g packet Take 17 g by mouth daily as needed. Patient taking differently: Take 17 g by mouth 2 (two) times daily. 08/03/18  Yes Ojie, Jude, MD  potassium chloride SA (KLOR-CON M) 20 MEQ tablet Take 20 mEq by mouth daily.   Yes [provider]  rivaroxaban (XARELTO) 20 MG TABS tablet Take 20 mg by mouth daily with supper.   Yes [provider]  senna-docusate (SENOKOT-S) 8.6-50 MG tablet Take 1 tablet by mouth 2 (two) times daily.   Yes [provider]  sertraline (ZOLOFT) 50 MG tablet Take 50 mg by mouth at bedtime.   Yes [provider]  spironolactone (ALDACTONE) 25 MG tablet Take 25 mg by mouth daily.   Yes [provider]  traZODone (DESYREL) 50 MG tablet  Take 25 mg by mouth at bedtime.   Yes [provider]  apixaban (ELIQUIS) 5 MG TABS tablet Take 1 tablet (5 mg total) by mouth 2 (two) times daily. Patient not taking: Reported on 08/08/2022 07/12/22   Delfino Lovett, MD  torsemide (DEMADEX) 20 MG tablet Hold pending outpatient followup due to acute kidney injury. Patient not taking: Reported on 08/08/2022 02/17/21   Darlin Priestly, MD     Vitals:   08/08/22 1805 08/08/22 1815 08/08/22 1826 08/08/22 1900  BP:  (!) 107/90  122/86  Pulse: 88 94  62  Resp:  17 18   Temp:   97.9 F (36.6 C)   SpO2: 100% 96%  96%  Weight:      Height:       Physical Exam Vitals and nursing note reviewed.  Constitutional:      General: He is not in acute distress. HENT:     Head: Normocephalic and atraumatic.     Right Ear: Hearing and external ear normal.     Left Ear: Hearing and external ear normal.     Nose: Nose normal. No nasal deformity.     Mouth/Throat:     Lips: Pink.     Tongue: No lesions.     Pharynx: Oropharynx is clear.  Eyes:     General: Lids are normal.     Extraocular Movements: Extraocular movements intact.  Cardiovascular:     Rate and Rhythm: Normal rate. Rhythm irregular.     Pulses:          Dorsalis pedis pulses are detected w/ Doppler on the right side and detected w/ Doppler on the left side.       Posterior tibial pulses are detected w/ Doppler on the right side and detected w/ Doppler on the left side.     Heart sounds: Normal heart sounds.     Comments: Edema and has left elbow, patient is morbidly obese and exam is limited secondary to his obesity and inability to move.  Patient is very weak deconditioned and when sitting up or helped to sit up to auscultate patient moans in pain.  Lower extremity pulses detected with Doppler but weak even with Doppler. Pulmonary:     Effort: Pulmonary effort is normal.     Breath sounds: Normal breath sounds.  Abdominal:     General: Bowel sounds are normal. There is no distension.      Palpations: Abdomen is soft. There is no mass.     Tenderness: There is no abdominal tenderness.  Musculoskeletal:     Right lower leg: No edema.     Left lower leg: No edema.  Skin:    General: Skin is warm.  Neurological:     General: No focal deficit present.     Mental Status: He is alert and oriented to person, place, and time.     Cranial Nerves: Cranial nerves 2-12 are intact.  Psychiatric:        Attention and Perception: Attention normal.        Mood and Affect: Mood normal.        Speech: Speech normal.        Behavior: Behavior normal. Behavior is cooperative.      Labs on Admission: I have personally reviewed following labs and imaging studies  CBC: Recent Labs  Lab 08/08/22 1415  WBC 31.2*  NEUTROABS 28.7*  HGB 11.0*  HCT 36.1*  MCV 102.0*  PLT 450*   Basic Metabolic Panel: Recent Labs  Lab 08/08/22 1415  NA 137  K 4.3  CL 106  CO2 22  GLUCOSE 106*  BUN 19  CREATININE 1.84*  CALCIUM 8.2*   GFR: Estimated Creatinine Clearance: 38.8 mL/min (A) (by C-G formula based on SCr of 1.84 mg/dL (H)). Liver Function Tests: Recent Labs  Lab 08/08/22 1415  AST 21  ALT 14  ALKPHOS 101  BILITOT 0.8  PROT 5.4*  ALBUMIN 1.9*   No results for input(s): "LIPASE", "AMYLASE" in the last 168 hours. No results for input(s): "AMMONIA" in the last 168 hours. Coagulation Profile: No results for input(s): "INR", "PROTIME" in the last 168 hours. Cardiac Enzymes: No results for input(s): "CKTOTAL", "CKMB", "CKMBINDEX", "TROPONINI" in the last 168 hours. BNP (last 3 results) No results for input(s): "PROBNP" in the last 8760 hours. HbA1C: No results for input(s): "HGBA1C" in the last 72 hours. CBG: No results for input(s): "GLUCAP" in the last 168 hours. Lipid Profile: No results for input(s): "CHOL", "HDL", "LDLCALC", "TRIG", "CHOLHDL", "LDLDIRECT" in the last 72 hours. Thyroid Function Tests: No results for input(s): "TSH", "T4TOTAL", "FREET4", "T3FREE",  "THYROIDAB" in the last 72 hours. Anemia Panel: No results for input(s): "VITAMINB12", "FOLATE", "FERRITIN", "TIBC", "IRON", "RETICCTPCT" in the last 72 hours. Urine analysis:    Component Value Date/Time   COLORURINE YELLOW (A) 08/08/2022 1347   APPEARANCEUR TURBID (A) 08/08/2022 1347   LABSPEC 1.008 08/08/2022 1347   PHURINE 5.0 08/08/2022 1347   GLUCOSEU NEGATIVE 08/08/2022 1347   HGBUR SMALL (A) 08/08/2022 1347   BILIRUBINUR NEGATIVE 08/08/2022 1347   KETONESUR NEGATIVE 08/08/2022 1347   PROTEINUR >=300 (A) 08/08/2022 1347   NITRITE NEGATIVE 08/08/2022 1347   LEUKOCYTESUR MODERATE (A) 08/08/2022 1347    Radiological Exams on Admission: US RENAL  Result Date: 08/08/2022 CLINICAL DATA:  [id number] AKI (acute kidney injury) (HCC) [id number] EXAM: RENAL / URINARY TRACT ULTRASOUND COMPLETE COMPARISON:  January 05, 2015 FINDINGS: Evaluation is limited by limited acoustic windows and inability to cooperate fully with exam. Right Kidney: Renal measurements: 10.0 x 5.1 x 4.9 cm = volume: 131 mL. Echogenicity within normal limits. No mass or hydronephrosis visualized. Left  Kidney: Renal measurements: 10.5 x 5.9 x 6.1 cm = volume: 196 mL. Echogenicity within normal limits. No mass or hydronephrosis visualized. Bladder: Completely decompressed around a Foley catheter. Other: None. IMPRESSION: No hydronephrosis. Electronically Signed   By: Meda Klinefelter M.D.   On: 08/08/2022 19:07   DG Chest Port 1 View  Result Date: 08/08/2022 CLINICAL DATA:  Sepsis EXAM: PORTABLE CHEST 1 VIEW COMPARISON:  02/14/2021 FINDINGS: Stable cardiomegaly. Pulmonary vascular congestion. Slightly increased interstitial markings bilaterally. No pleural effusion or pneumothorax. Advanced degenerative changes of the shoulders. IMPRESSION: Cardiomegaly with pulmonary vascular congestion and mild interstitial edema. Electronically Signed   By: Duanne Guess D.O.   On: 08/08/2022 14:12     Data Reviewed: Relevant notes from  primary care and specialist visits, past discharge summaries as available in EHR, including Care Everywhere. Prior diagnostic testing as pertinent to current admission diagnoses Updated medications and problem lists for reconciliation ED course, including vitals, labs, imaging, treatment and response to treatment Triage notes, nursing and pharmacy notes and ED provider's notes Notable results as noted in HPI Assessment and Plan: Patient is a 82 year old male with a complicated past medical history and multiple comorbidities essentially bedridden due to wounds and body habitus morbid obesity, history of A-fib on Eliquis and reduced ejection fraction congestive heart failure, coming to [place] brought for abnormal blood work and leukocytosis.  His prognosis is guarded due to his comorbidities and current presentation.  >> Severe sepsis with septic shock: Admit to stepdown unit. Continue with IV fluids LR at 50 with strict I's and O's and daily weights. Low threshold for vasopressor therapy and intensivist consult. Continue with IV antibiotic regimen patient started on sepsis protocol broad-spectrum IV antibiotics with vancomycin and cefepime. Pharmacy consult for monitoring of antibiotic regimen. Aspiration/fall precautions. GI precautions with Protonix. Evaluation for source as I suspect this may not just be a UTI were going to image his lower extremities and make sure he does not have any abscess or cellulitis in addition to his urinary tract infection. Will also obtain an abdominal CT scan noncontrast. Lactic Acid, Venous    Component Value Date/Time   LATICACIDVEN 5.4 (HH) 08/08/2022 1918     >> Urinary tract infection: Foley changed. Continue with gentle IV fluid hydration. Follow culture sensitivity. Continue with cefepime.  >> Severe sepsis with septic shock: Vitals:   08/08/22 1425 08/08/22 1430 08/08/22 1445 08/08/22 1500  BP: (!) 92/56 (!) 88/59 (!) 87/74 (!) 81/49   08/08/22  1545 08/08/22 1600 08/08/22 1715 08/08/22 1730  BP: (!) 94/57 (!) 102/59 105/62 109/82   08/08/22 1745 08/08/22 1800 08/08/22 1815 08/08/22 1900  BP: 107/76 (!) 120/93 (!) 107/90 122/86  No intake or output data in the 24 hours ending 08/08/22 2010 patient's blood pressure still remains low even after 3 L of LR given in the emergency room. With patient's heart failure and ejection fraction of 40% which was 3 years ago I suspect his cardiac function is still further lower than this. Vasopressor therapy per intensivist consulted.Will continue with IV antibiotic therapy. Currently home regimen of metoprolol, Aldactone, torsemide is held due to patient's hypotension.  >> Acute kidney injury: Patient has had intermittent AKI. Lab Results  Component Value Date   CREATININE 1.84 (H) 08/08/2022   CREATININE 0.75 06/28/2022   CREATININE 0.96 06/27/2022  Renally dose all needed medications and antibiotics. Renal ultrasound on negative for any obstruction or hydronephrosis.  Avoid contrast.  >> Lower extremity pain: Suspect secondary to arterial insufficiency.  Will consult vascular. As needed morphine for pain. CT imaging of the lower extremities is pending. Wound care consult.  >> Absent pedal pulses: Patient has weak dopplerable pulses in both legs. Vascular consult. Home regimen consists of Eliquis, Xarelto, will continue once med rec is done.   >> Anemia-B12 deficiency: Per hematology note. Type and screen, IV PPI.   DVT prophylaxis:  Eliquis or Xarelto.  Consults: Vascular Dr dew  Advance Care Planning:    Code Status: Full Code   Family Communication:  None  Disposition Plan:  Back to previous home environment  Severity of Illness: The appropriate patient status for this patient is INPATIENT. Inpatient status is judged to be reasonable and necessary in order to provide the required intensity of service to ensure the patient's safety. The patient's presenting symptoms,  physical exam findings, and initial radiographic and laboratory data in the context of their chronic comorbidities is felt to place them at high risk for further clinical deterioration. Furthermore, it is not anticipated that the patient will be medically stable for discharge from the hospital within 2 midnights of admission.   * I certify that at the point of admission it is my clinical judgment that the patient will require inpatient hospital care spanning beyond 2 midnights from the point of admission due to high intensity of service, high risk for further deterioration and high frequency of surveillance required.*  Author: Gertha Calkin, MD 08/08/2022 8:10 PM  For on call review [email or web address].

## 2022-08-08 NOTE — ED Notes (Signed)
CRITICAL VALUE STICKER  CRITICAL VALUE:lactic 2.0  RECEIVER (on-site recipient of call):I. Logan Bores, RN  DATE & TIME NOTIFIED: 08/08/22 1457  MESSENGER (representative from lab):lab  MD NOTIFIED: Derrill Kay  TIME OF NOTIFICATION:1458  RESPONSE:

## 2022-08-08 NOTE — ED Notes (Signed)
Pt arrived from facility with foley catheter in place. Pt unsure how long it has been since it was changed

## 2022-08-09 ENCOUNTER — Encounter: Payer: Self-pay | Admitting: Internal Medicine

## 2022-08-09 DIAGNOSIS — B9620 Unspecified Escherichia coli [E. coli] as the cause of diseases classified elsewhere: Secondary | ICD-10-CM

## 2022-08-09 DIAGNOSIS — D539 Nutritional anemia, unspecified: Secondary | ICD-10-CM

## 2022-08-09 DIAGNOSIS — I429 Cardiomyopathy, unspecified: Secondary | ICD-10-CM

## 2022-08-09 DIAGNOSIS — I4891 Unspecified atrial fibrillation: Secondary | ICD-10-CM

## 2022-08-09 DIAGNOSIS — D72829 Elevated white blood cell count, unspecified: Secondary | ICD-10-CM

## 2022-08-09 DIAGNOSIS — E872 Acidosis, unspecified: Secondary | ICD-10-CM

## 2022-08-09 DIAGNOSIS — I739 Peripheral vascular disease, unspecified: Secondary | ICD-10-CM | POA: Diagnosis present

## 2022-08-09 DIAGNOSIS — E039 Hypothyroidism, unspecified: Secondary | ICD-10-CM

## 2022-08-09 DIAGNOSIS — A419 Sepsis, unspecified organism: Secondary | ICD-10-CM | POA: Diagnosis not present

## 2022-08-09 DIAGNOSIS — R6521 Severe sepsis with septic shock: Secondary | ICD-10-CM

## 2022-08-09 DIAGNOSIS — N390 Urinary tract infection, site not specified: Secondary | ICD-10-CM

## 2022-08-09 DIAGNOSIS — N1831 Chronic kidney disease, stage 3a: Secondary | ICD-10-CM

## 2022-08-09 DIAGNOSIS — I10 Essential (primary) hypertension: Secondary | ICD-10-CM

## 2022-08-09 DIAGNOSIS — R7881 Bacteremia: Secondary | ICD-10-CM

## 2022-08-09 DIAGNOSIS — N179 Acute kidney failure, unspecified: Secondary | ICD-10-CM | POA: Diagnosis not present

## 2022-08-09 DIAGNOSIS — Z1612 Extended spectrum beta lactamase (ESBL) resistance: Secondary | ICD-10-CM

## 2022-08-09 LAB — CBG MONITORING, ED
Glucose-Capillary: 70 mg/dL (ref 70–99)
Glucose-Capillary: 102 mg/dL — ABNORMAL HIGH (ref 70–99)
Glucose-Capillary: 75 mg/dL (ref 70–99)

## 2022-08-09 LAB — BLOOD CULTURE ID PANEL (REFLEXED) - BCID2
A.calcoaceticus-baumannii: NOT DETECTED
Bacteroides fragilis: NOT DETECTED
CTX-M ESBL: DETECTED — AB
Candida auris: NOT DETECTED
Candida parapsilosis: NOT DETECTED
Carbapenem resistance IMP: NOT DETECTED
Carbapenem resistance KPC: NOT DETECTED
Cryptococcus neoformans/gattii: NOT DETECTED
Enterobacter cloacae complex: NOT DETECTED
Enterobacterales: DETECTED — AB
Klebsiella oxytoca: NOT DETECTED
Klebsiella pneumoniae: DETECTED — AB
Pseudomonas aeruginosa: NOT DETECTED
Salmonella species: NOT DETECTED
Staphylococcus epidermidis: NOT DETECTED
Streptococcus pneumoniae: NOT DETECTED
Streptococcus pyogenes: NOT DETECTED

## 2022-08-09 LAB — BLOOD GAS, VENOUS
Acid-base deficit: 6 mmol/L — ABNORMAL HIGH (ref 0.0–2.0)
Bicarbonate: 18.8 mmol/L — ABNORMAL LOW (ref 20.0–28.0)
O2 Saturation: 67.7 %
Patient temperature: 37
pCO2, Ven: 34 mmHg — ABNORMAL LOW (ref 44–60)
pH, Ven: 7.35 (ref 7.25–7.43)
pO2, Ven: 40 mmHg (ref 32–45)

## 2022-08-09 LAB — CULTURE, BLOOD (ROUTINE X 2)

## 2022-08-09 LAB — CORTISOL-AM, BLOOD: Cortisol - AM: 34.6 ug/dL — ABNORMAL HIGH (ref 6.7–22.6)

## 2022-08-09 LAB — LACTIC ACID, PLASMA
Lactic Acid, Venous: 3.7 mmol/L (ref 0.5–1.9)
Lactic Acid, Venous: 6.4 mmol/L (ref 0.5–1.9)

## 2022-08-09 LAB — GLUCOSE, CAPILLARY: Glucose-Capillary: 94 mg/dL (ref 70–99)

## 2022-08-09 LAB — PROTIME-INR
INR: 1.6 — ABNORMAL HIGH (ref 0.8–1.2)
Prothrombin Time: 19.1 seconds — ABNORMAL HIGH (ref 11.4–15.2)

## 2022-08-09 LAB — BRAIN NATRIURETIC PEPTIDE: B Natriuretic Peptide: 489.1 pg/mL — ABNORMAL HIGH (ref 0.0–100.0)

## 2022-08-09 LAB — PROCALCITONIN: Procalcitonin: 11.89 ng/mL

## 2022-08-09 MED ORDER — PANTOPRAZOLE SODIUM 40 MG PO TBEC
40.0000 mg | DELAYED_RELEASE_TABLET | Freq: Every day | ORAL | Status: DC
  Administered 2022-08-10 – 2022-08-17 (×8): 40 mg via ORAL
  Filled 2022-08-09 (×8): qty 1

## 2022-08-09 MED ORDER — FLUTICASONE FUROATE-VILANTEROL 100-25 MCG/ACT IN AEPB
1.0000 | INHALATION_SPRAY | Freq: Every day | RESPIRATORY_TRACT | Status: DC
  Administered 2022-08-10 – 2022-08-17 (×8): 1 via RESPIRATORY_TRACT
  Filled 2022-08-09 (×2): qty 28

## 2022-08-09 MED ORDER — FERROUS SULFATE 325 (65 FE) MG PO TABS
325.0000 mg | ORAL_TABLET | Freq: Every day | ORAL | Status: DC
  Administered 2022-08-10 – 2022-08-17 (×8): 325 mg via ORAL
  Filled 2022-08-09 (×8): qty 1

## 2022-08-09 MED ORDER — LORATADINE 10 MG PO TABS
10.0000 mg | ORAL_TABLET | Freq: Every day | ORAL | Status: DC
  Administered 2022-08-10 – 2022-08-17 (×8): 10 mg via ORAL
  Filled 2022-08-09 (×8): qty 1

## 2022-08-09 MED ORDER — FLUTICASONE PROPIONATE 50 MCG/ACT NA SUSP
2.0000 | Freq: Every day | NASAL | Status: DC
  Administered 2022-08-11 – 2022-08-17 (×7): 2 via NASAL
  Filled 2022-08-09 (×3): qty 16

## 2022-08-09 MED ORDER — CYANOCOBALAMIN 1000 MCG/ML PO LIQD: 1000.0000 ug | Freq: Every day | ORAL | Status: DC

## 2022-08-09 MED ORDER — CARBOXYMETHYLCELLULOSE SOD PF 1 % OP GEL: 1.0000 [drp] | Freq: Two times a day (BID) | OPHTHALMIC | Status: DC

## 2022-08-09 MED ORDER — HYDROCODONE-ACETAMINOPHEN 5-325 MG PO TABS
1.0000 | ORAL_TABLET | ORAL | Status: DC | PRN
  Administered 2022-08-10 – 2022-08-11 (×3): 1 via ORAL
  Filled 2022-08-09 (×4): qty 1

## 2022-08-09 MED ORDER — RIVAROXABAN 20 MG PO TABS
20.0000 mg | ORAL_TABLET | Freq: Every day | ORAL | Status: DC
  Administered 2022-08-09 – 2022-08-17 (×9): 20 mg via ORAL
  Filled 2022-08-09 (×9): qty 1

## 2022-08-09 MED ORDER — POLYVINYL ALCOHOL 1.4 % OP SOLN
1.0000 [drp] | OPHTHALMIC | Status: DC | PRN
  Filled 2022-08-09: qty 15

## 2022-08-09 MED ORDER — VITAMIN B-12 1000 MCG PO TABS
1000.0000 ug | ORAL_TABLET | Freq: Every day | ORAL | Status: DC
  Administered 2022-08-10 – 2022-08-17 (×8): 1000 ug via ORAL
  Filled 2022-08-09 (×8): qty 1

## 2022-08-09 MED ORDER — UMECLIDINIUM BROMIDE 62.5 MCG/ACT IN AEPB
1.0000 | INHALATION_SPRAY | Freq: Every day | RESPIRATORY_TRACT | Status: DC
  Administered 2022-08-10 – 2022-08-17 (×8): 1 via RESPIRATORY_TRACT
  Filled 2022-08-09 (×2): qty 7

## 2022-08-09 MED ORDER — TRAZODONE HCL 50 MG PO TABS
25.0000 mg | ORAL_TABLET | Freq: Every day | ORAL | Status: DC
  Administered 2022-08-09 – 2022-08-16 (×8): 25 mg via ORAL
  Filled 2022-08-09 (×8): qty 1

## 2022-08-09 MED ORDER — ADULT MULTIVITAMIN W/MINERALS CH
1.0000 | ORAL_TABLET | Freq: Every day | ORAL | Status: DC
  Administered 2022-08-10 – 2022-08-17 (×7): 1 via ORAL
  Filled 2022-08-09 (×7): qty 1

## 2022-08-09 MED ORDER — SODIUM CHLORIDE 0.9 % IV SOLN
1.0000 g | Freq: Two times a day (BID) | INTRAVENOUS | Status: DC
  Administered 2022-08-09 – 2022-08-10 (×3): 1 g via INTRAVENOUS
  Filled 2022-08-09 (×4): qty 20

## 2022-08-09 NOTE — Progress Notes (Signed)
PHARMACY - PHYSICIAN COMMUNICATION CRITICAL VALUE ALERT - BLOOD CULTURE IDENTIFICATION (BCID)  Daniel Eaton. is an 82 y.o. male who presented to [facility] on 08/08/2022 with a chief complaint of sepsis  Assessment:  3/4 blood cultures growing GNR.  BCID was positive for both E. Coli and Klebsiella Pneumoniae with the CTX-M gene detected.  Name of physician (or Provider) Contacted: Dr. Allena Katz  Current antibiotics: Cefepime and vancomycin  Changes to prescribed antibiotics recommended:  Change cefepime to Meropenem.   Results for orders placed or performed during the hospital encounter of 08/08/22  Blood Culture ID Panel (Reflexed) (Collected: 08/08/2022  2:22 PM)  Result Value Ref Range   Enterococcus faecalis NOT DETECTED NOT DETECTED   Enterococcus Faecium NOT DETECTED NOT DETECTED   Listeria monocytogenes NOT DETECTED NOT DETECTED   Staphylococcus species NOT DETECTED NOT DETECTED   Staphylococcus aureus (BCID) NOT DETECTED NOT DETECTED   Staphylococcus epidermidis NOT DETECTED NOT DETECTED   Staphylococcus lugdunensis NOT DETECTED NOT DETECTED   Streptococcus species NOT DETECTED NOT DETECTED   Streptococcus agalactiae NOT DETECTED NOT DETECTED   Streptococcus pneumoniae NOT DETECTED NOT DETECTED   Streptococcus pyogenes NOT DETECTED NOT DETECTED   A.calcoaceticus-baumannii NOT DETECTED NOT DETECTED   Bacteroides fragilis NOT DETECTED NOT DETECTED   Enterobacterales DETECTED (A) NOT DETECTED   Enterobacter cloacae complex NOT DETECTED NOT DETECTED   Escherichia coli DETECTED (A) NOT DETECTED   Klebsiella aerogenes NOT DETECTED NOT DETECTED   Klebsiella oxytoca NOT DETECTED NOT DETECTED   Klebsiella pneumoniae DETECTED (A) NOT DETECTED   Proteus species NOT DETECTED NOT DETECTED   Salmonella species NOT DETECTED NOT DETECTED   Serratia marcescens NOT DETECTED NOT DETECTED   Haemophilus influenzae NOT DETECTED NOT DETECTED   Neisseria meningitidis NOT DETECTED NOT  DETECTED   Pseudomonas aeruginosa NOT DETECTED NOT DETECTED   Stenotrophomonas maltophilia NOT DETECTED NOT DETECTED   Candida albicans NOT DETECTED NOT DETECTED   Candida auris NOT DETECTED NOT DETECTED   Candida glabrata NOT DETECTED NOT DETECTED   Candida krusei NOT DETECTED NOT DETECTED   Candida parapsilosis NOT DETECTED NOT DETECTED   Candida tropicalis NOT DETECTED NOT DETECTED   Cryptococcus neoformans/gattii NOT DETECTED NOT DETECTED   CTX-M ESBL DETECTED (A) NOT DETECTED   Carbapenem resistance IMP NOT DETECTED NOT DETECTED   Carbapenem resistance KPC NOT DETECTED NOT DETECTED   Carbapenem resistance NDM NOT DETECTED NOT DETECTED   Carbapenem resist OXA 48 LIKE NOT DETECTED NOT DETECTED   Carbapenem resistance VIM NOT DETECTED NOT DETECTED    Barrie Folk 08/09/2022  3:37 AM

## 2022-08-09 NOTE — ED Notes (Signed)
Pt had brown, liquid BM. Perineal care performed. New bed linens and absorbent pads placed. Pt c/o pain with movement.

## 2022-08-09 NOTE — Assessment & Plan Note (Signed)
Continue Synthroid °

## 2022-08-09 NOTE — Assessment & Plan Note (Addendum)
Patient met severe sepsis criteria with septic shock with leukocytosis, AKI, and severe lactic acidosis, peaked at more than 9. Likely secondary to UTI, patient came with Foley catheter in place which was replaced in ED. Blood cultures with E. coli and Klebsiella pneumonia, ESBL Urine cultures pending-ordered as add-on -ID consult -Cefepime switched with meropenem -Discontinue vancomycin-lower extremity wounds are healing well

## 2022-08-09 NOTE — Assessment & Plan Note (Signed)
Being followed up by vascular surgery as outpatient. 1+ pedal pulses. -Patient is not on any aspirin or statin at home. -On Xarelto

## 2022-08-09 NOTE — ED Notes (Signed)
Bed sheets and absorbent pads changed. Perineal care performed. Pt repositioned and padded with pillows.

## 2022-08-09 NOTE — Progress Notes (Signed)
Daniel Eaton. is an 82 y.o. male who presented to to the ED on 08/08/2022 with a chief complaint of sepsis. BCID was positive for both E. Coli and Klebsiella Pneumoniae with the CTX-M gene detected.    PCCM consulted for pressors however patient is currently hemodynamically stable with no pressor requirements. Last 6 BP reading with MAPS>65, most recent BP 106/68 qwith MAP OF 80.  He is a alert and oriented x 4 on, no oxygen requirement and protecting his airway. No critical care needs assessed. PCCM will sign off at this time. Please re-consult if needed.   Webb Silversmith, DNP, CCRN, FNP-C, AGACNP-BC Acute Care & Family Nurse Practitioner  [facility]  See Amion for personal pager PCCM on call pager [phone] until 7 am

## 2022-08-09 NOTE — Progress Notes (Signed)
Progress Note   Patient: Daniel Eaton. YQM:[id number] DOB: Jun 15, 1940 DOA: 08/08/2022     1 DOS: the patient was seen and examined on 08/09/2022   Brief hospital course: Daimon Kean. is a 82 y.o. male with medical history significant for HFrEF(35-40%), hypertension, COPD, A-fib, on Eliquis, PAD, anemia and hypothyroidism presented from his facility with left leg pain and abnormal labs which include significant leukocytosis and AKI.  Patient was recently admitted at [facility] from 5/8 till 5/15 due to left lower extremity hematoma burst, he was seen by vascular surgery and discharged with wound VAC along with 10 days of antibiotics with Cipro and Zyvox.  Wound cultures at that time grew MRSA and procidentia.  His home Eliquis was held at that time with instructions to resume on 07/12/2022.  Patient was being followed up by vascular surgery as outpatient and recently wound VAC was removed.  ED course.  Patient was found to be hypotensive which responded initially to IV fluid resuscitation.  Labs pertinent for leukocytosis at 31.2, absolute neutrophil 28.7, hemoglobin 11, platelets 450, MCV 102, creatinine 1.84,(baseline appears to be around 1) albumin 1.9, lactic acid 2 >>9.0.  UA with mild hematuria and pyuria.  Chest x-ray with mild interstitial edema and pulmonary vascular congestion.  Renal ultrasound with no hydronephrosis.  CT abdomen and pelvis was negative for any acute abnormality. CT lower extremities with diffuse skin thickening and subcutaneous edema of right lower extremity most consistent with cellulitis.  Interval decrease in the size of subcutaneous collection/hematoma in the medial proximal right calf as compared to prior CT.  Also shows diffused edema of left lower extremity with interval resolution of previously seen collection/hematoma and skin wound.  No osseous abnormality.  Cultures were drawn and patient was admitted for concern of sepsis with septic shock.  Initially started on  cefepime and vancomycin  6/25: Afebrile with blood pressure on softer side at 97/52.  Lactic acid started improving at 3.8.  Procalcitonin at 11.89, blood cultures with E. coli and Klebsiella pneumonia, CTX-M gene detected, cefepime switched with meropenem.  Urine cultures ordered as add-on.    Assessment and Plan: * Severe sepsis with septic shock [facility]) Patient met severe sepsis criteria with septic shock with leukocytosis, AKI, and severe lactic acidosis, peaked at more than 9. Likely secondary to UTI, patient came with Foley catheter in place which was replaced in ED. Blood cultures with E. coli and Klebsiella pneumonia, ESBL Urine cultures pending-ordered as add-on -ID consult -Cefepime switched with meropenem -Discontinue vancomycin-lower extremity wounds are healing well  Lactic acidosis Lactic acid peaked at above 9.  Now trending down, still elevated above 3.  Secondary to sepsis -Giving some more IV fluid -Continue to monitor  Acute renal failure superimposed on stage 3a chronic kidney disease (HCC) Creatinine at 1.84, baseline around 1. -Monitor renal function -Avoid nephrotoxins  Atrial fibrillation (HCC) Currently rate controlled. -Holding home metoprolol due to softer blood pressure -Continue with amiodarone and Xarelto  Hypothyroid -Continue Synthroid  PAD (peripheral artery disease) (HCC) Being followed up by vascular surgery as outpatient. 1+ pedal pulses. -Patient is not on any aspirin or statin at home. -On Xarelto  Cardiomyopathy (HCC) EF of 30 to 40% on prior echo.  Severely dilated both ventricles. Clinically appears euvolemic -Holding home torsemide and spironolactone due to softer blood pressure -Check BNP  Macrocytic anemia Being managed by hematology as outpatient.  Hemoglobin seems improving, at 11.  History of B12 deficiency -Continue with home B12 and  iron supplement  Essential hypertension Blood pressure currently soft.  Not needing any  pressors but holding home antihypertensives.      Subjective: Patient was resting comfortably when seen today.  Easily arousable.  History of burning micturition and lower abdominal pain.  Not sure for how long he was having Foley catheter in.  He was also not sure why it was placed.  Physical Exam: Vitals:   08/09/22 1217 08/09/22 1250 08/09/22 1340 08/09/22 1429  BP: (!) 124/91 108/77 (!) 96/59 98/61  Pulse: 88  89 86  Resp: 20  17 18   Temp:      SpO2: 94%  97% 95%  Weight:      Height:       General.  Frail elderly man, in no acute distress. Pulmonary.  Lungs clear bilaterally, normal respiratory effort. CV.  Regular rate and rhythm, no JVD, rub or murmur. Abdomen.  Soft, nontender, nondistended, BS positive. CNS.  Alert and oriented x 2.  No focal neurologic deficit. Extremities.  No edema, pulses intact and symmetrical. Psychiatry.  Appears to have some cognitive impairment  Data Reviewed: Prior data reviewed  Family Communication: Talked with significant other, [name] on phone.  Disposition: Status is: Inpatient Remains inpatient appropriate because: Severity of illness  Planned Discharge Destination: Skilled nursing facility  DVT prophylaxis.  Xarelto Time spent: 52 minutes  This record has been created using Conservation officer, historic buildings. Errors have been sought and corrected,but may not always be located. Such creation errors do not reflect on the standard of care.   Author: Arnetha Courser, MD 08/09/2022 2:37 PM  For on call review [email or web address].

## 2022-08-09 NOTE — Assessment & Plan Note (Signed)
Creatinine at 1.84, baseline around 1. -Monitor renal function -Avoid nephrotoxins

## 2022-08-09 NOTE — ED Notes (Signed)
Pt sleeping at this time. No complaints.

## 2022-08-09 NOTE — Assessment & Plan Note (Signed)
Blood pressure currently soft.  Not needing any pressors but holding home antihypertensives. 

## 2022-08-09 NOTE — Assessment & Plan Note (Addendum)
Currently rate controlled. -Holding home metoprolol due to softer blood pressure -Continue with amiodarone and Xarelto

## 2022-08-09 NOTE — Assessment & Plan Note (Signed)
Lactic acid peaked at above 9.  Now trending down, still elevated above 3.  Secondary to sepsis -Giving some more IV fluid -Continue to monitor

## 2022-08-09 NOTE — Consult Note (Signed)
NAME: [facility].  DOB: [birth year]  MRN: [id number]  Date/Time: 08/09/2022 2:45 PM  REQUESTING PROVIDER: Dr.Amin Subjective:  REASON FOR CONSULT: ESBL bacteremia ? [facility]. is a 82 y.o. with a history of CKD, AFIB was on anticoagulant, CHF, COPD, HTN, CVA, was recently in [facility] between 06/22/22-06/29/22 for anemia and Hb of 5.8, left leg hematoma after striking on wheel chair , underwent evacuation of hematoma and excisional debridement of necrotic tissue  and apllication of wound vac- culture positive for MRSA and providencia was sent to SNF on cipro and linezolid and completed a total of 11 days of Rx. Was assessed by vascular on 07/26/22 and as the woud was very small vac was removed Pt presented from [facility] on 08/08/22 with abnormal labs. WBC of 28 And cr of 1.55. pt had told the admitting hospitalist that urine was nasty. He had a foley on presentation- it had been placed at [facility] in the ED  08/08/22  BP 92/58 !  Temp 97.9 F (36.6 C)  Pulse Rate 62  Resp 20  SpO2 96 %  Note: Showing the most recent values for these dates. There are additional values that can be seen in Synopsis.   Latest Reference Range & Units 08/08/22  WBC 4.0 - 10.5 K/uL 31.2 (H)  Hemoglobin 13.0 - 17.0 g/dL 62.9 (L)  HCT 52.8 - 41.3 % 36.1 (L)  Platelets 150 - 400 K/uL 450 (H)  Creatinine 0.61 - 1.24 mg/dL 2.44 (H)  Pt says he has poor mobility and is usually wheel chair/bed bound He says all the skin changes and bruising started after xarelto was changed to eliquis He has no fever or chills Blood culture sent and started on vanco and cefepime UA many wbc CT abdomen and pelvis- no renal issues CT of the lower extremities- b/l hematomas in shin area  Past Medical History:  Diagnosis Date   A-fib (HCC)    Cellulitis    CHF (congestive heart failure) (HCC)    COPD (chronic obstructive pulmonary disease) (HCC)    Difficult intubation    Hyperlipidemia    Hypertension    Hypothyroidism     Kidney disease    Sleep apnea    Stroke [facility])    Thyroid disease     Past Surgical History:  Procedure Laterality Date   APPLICATION OF WOUND VAC Left 06/24/2022   Procedure: APPLICATION OF WOUND VAC;  Surgeon: Daniel Dills, MD;  Location: [facility];  Service: Vascular;  Laterality: Left;   APPLICATION OF WOUND VAC  06/28/2022   Procedure: APPLICATION OF WOUND VAC;  Surgeon: Daniel Dills, MD;  Location: [facility];  Service: Vascular;;   CARDIAC CATHETERIZATION     CATARACT EXTRACTION W/ INTRAOCULAR LENS  IMPLANT, BILATERAL     COLONOSCOPY  2008   HEMATOMA EVACUATION Left 06/24/2022   Procedure: EVACUATION HEMATOMA;  Surgeon: Daniel Dills, MD;  Location: [facility];  Service: Vascular;  Laterality: Left;  wound vac   HERNIA REPAIR     bilateral inguinal hernia/ [facility]   HERNIA REPAIR  02/02/2015   18 x 28 cm ventral light mesh placed laparoscopically.   INCISION AND DRAINAGE Left 06/24/2022   Procedure: INCISION AND DRAINAGE;  Surgeon: Daniel Dills, MD;  Location: [facility];  Service: Vascular;  Laterality: Left;   TONSILLECTOMY     UMBILICAL HERNIA REPAIR N/A 02/02/2015   Procedure: HERNIA REPAIR UMBILICAL ADULT;  Surgeon: Daniel Mayotte, MD;  Location:  [facility];  Service: General;  Laterality: N/A;   VENTRAL HERNIA REPAIR N/A 02/02/2015   Procedure: LAPAROSCOPIC VENTRAL HERNIA;  Surgeon: [facility] Mayotte, MD;  Location: [facility];  Service: General;  Laterality: N/A;   vp shunt placement  1979    Social History   Socioeconomic History   Marital status: Divorced    Spouse name: Not on file   Number of children: Not on file   Years of education: Not on file   Highest education level: Not on file  Occupational History   Not on file  Tobacco Use   Smoking status: Former    Packs/day: 1.00    Years: 41.00    Additional pack years: 0.00    Total pack years: 41.00    Types: Cigarettes    Quit date: 56    Years since quitting: 25.4   Smokeless tobacco:  Never  Vaping Use   Vaping Use: Never used  Substance and Sexual Activity   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   Drug use: No   Sexual activity: Not Currently  Other Topics Concern   Not on file  Social History Narrative   Not on file   Social Determinants of Health   Financial Resource Strain: Not on file  Food Insecurity: No Food Insecurity (08/09/2022)   Hunger Vital Sign    Worried About Running Out of Food in the Last Year: Never true    Ran Out of Food in the Last Year: Never true  Transportation Needs: No Transportation Needs (08/09/2022)   PRAPARE - Administrator, Civil Service (Medical): No    Lack of Transportation (Non-Medical): No  Physical Activity: Not on file  Stress: Not on file  Social Connections: Not on file  Intimate Partner Violence: Not At Risk (08/09/2022)   Humiliation, Afraid, Rape, and Kick questionnaire    Fear of Current or Ex-Partner: No    Emotionally Abused: No    Physically Abused: No    Sexually Abused: No    Family History  Problem Relation Age of Onset   Heart disease Mother    Alcohol abuse Father    Bladder Cancer Sister    Allergies  Allergen Reactions   Diltiazem Hcl Itching and Other (See Comments)    edema   I? Current Facility-Administered Medications  Medication Dose Route Frequency Provider Last Rate Last Admin   amiodarone (PACERONE) tablet 200 mg  200 mg Oral q morning Irena Cords V, MD   200 mg at 08/09/22 0903   [START ON 08/10/2022] cyanocobalamin (VITAMIN B12) tablet 1,000 mcg  1,000 mcg Oral Daily Orson Aloe, [facility]       [START ON 08/10/2022] ferrous sulfate tablet 325 mg  325 mg Oral Daily Arnetha Courser, MD       [START ON 08/10/2022] fluticasone (FLONASE) 50 MCG/ACT nasal spray 2 spray  2 spray Each Nare Daily Arnetha Courser, MD       [START ON 08/10/2022] fluticasone furoate-vilanterol (BREO ELLIPTA) 100-25 MCG/ACT 1 puff  1 puff Inhalation Daily Arnetha Courser, MD       And   [START ON  08/10/2022] umeclidinium bromide (INCRUSE ELLIPTA) 62.5 MCG/ACT 1 puff  1 puff Inhalation Daily Arnetha Courser, MD       HYDROcodone-acetaminophen (NORCO/VICODIN) 5-325 MG per tablet 1 tablet  1 tablet Oral Q4H PRN Arnetha Courser, MD       lactated ringers infusion   Intravenous Continuous Arnetha Courser,  MD 100 mL/hr at 08/09/22 1011 Rate Change at 08/09/22 1011   levothyroxine (SYNTHROID) tablet 88 mcg  88 mcg Oral Daily Gertha Calkin, MD   88 mcg at 08/09/22 0514   [START ON 08/10/2022] loratadine (CLARITIN) tablet 10 mg  10 mg Oral Daily Arnetha Courser, MD       meropenem (MERREM) 1 g in sodium chloride 0.9 % 100 mL IVPB  1 g Intravenous Q12H Gertha Calkin, MD   Stopped at 08/09/22 0646   [START ON 08/10/2022] multivitamin with minerals tablet 1 tablet  1 tablet Oral Q lunch Arnetha Courser, MD       [START ON 08/10/2022] pantoprazole (PROTONIX) EC tablet 40 mg  40 mg Oral Daily Arnetha Courser, MD       [START ON 08/10/2022] polyvinyl alcohol (LIQUIFILM TEARS) 1.4 % ophthalmic solution 1 drop  1 drop Right Eye PRN Orson Aloe, RPH       rivaroxaban Carlena Hurl) tablet 20 mg  20 mg Oral Q supper Arnetha Courser, MD       sertraline (ZOLOFT) tablet 50 mg  50 mg Oral QHS Irena Cords V, MD   50 mg at 08/08/22 2155   traZODone (DESYREL) tablet 25 mg  25 mg Oral QHS Arnetha Courser, MD       Current Outpatient Medications  Medication Sig Dispense Refill   acetaminophen (TYLENOL) 500 MG tablet Take 500 mg by mouth every 12 (twelve) hours.     albuterol (VENTOLIN HFA) 108 (90 Base) MCG/ACT inhaler Inhale 2 puffs into the lungs every 6 (six) hours as needed for wheezing or shortness of breath.     alum & mag hydroxide-simeth (MAALOX/MYLANTA) 200-200-20 MG/5ML suspension Take 30 mLs by mouth every 6 (six) hours as needed for indigestion or heartburn.     amiodarone (PACERONE) 200 MG tablet Take 200 mg by mouth every morning.     Capsaicin (ASPERCREME PAIN RELIEF PATCH EX) Apply 1 patch topically every 12 (twelve)  hours. (Apply to right shoulder - 12 hours on/12 hours off)     Carboxymethylcellulose Sod PF (REFRESH CELLUVISC) 1 % GEL Place 1 drop into the right eye every 12 (twelve) hours.     Cyanocobalamin 1000 MCG/ML LIQD Take 1,000 mcg by mouth daily.     ferrous sulfate 325 (65 FE) MG tablet Take 1 tablet (325 mg total) by mouth daily. 30 tablet 3   fluticasone (FLONASE) 50 MCG/ACT nasal spray Place 2 sprays into both nostrils daily.     Fluticasone-Umeclidin-Vilant (TRELEGY ELLIPTA) 100-62.5-25 MCG/INH AEPB Inhale 1 puff into the lungs daily.     HYDROcodone-acetaminophen (NORCO/VICODIN) 5-325 MG tablet Take 1 tablet by mouth every 4 (four) hours as needed for moderate pain.     levothyroxine (SYNTHROID) 88 MCG tablet Take 88 mcg by mouth daily.      loratadine (CLARITIN) 10 MG tablet Take 10 mg by mouth daily.     Menthol, Topical Analgesic, (GOLD BOND EXTRA STRENGTH) 0.8 % POWD Apply 1 application  topically 2 (two) times daily. (Apply to back)     metoprolol succinate (TOPROL-XL) 50 MG 24 hr tablet Take 50 mg by mouth daily.     Multiple Vitamin (MULTI-VITAMINS) TABS Take 1 tablet by mouth daily.     omeprazole (PRILOSEC) 20 MG capsule Take 20 mg by mouth daily before supper.     polyethylene glycol (MIRALAX / GLYCOLAX) 17 g packet Take 17 g by mouth daily as needed. (Patient taking differently: Take 17 g by mouth  2 (two) times daily.) 14 each 0   potassium chloride SA (KLOR-CON M) 20 MEQ tablet Take 20 mEq by mouth daily.     Pramox-PE-Glycerin-Petrolatum (HEMORRHOIDAL) 1-0.25-14.4-15 % CREA Place 1 application  rectally every 6 (six) hours as needed (pain).     rivaroxaban (XARELTO) 20 MG TABS tablet Take 20 mg by mouth daily with supper.     senna-docusate (SENOKOT-S) 8.6-50 MG tablet Take 1 tablet by mouth 2 (two) times daily.     sertraline (ZOLOFT) 50 MG tablet Take 50 mg by mouth at bedtime.     spironolactone (ALDACTONE) 25 MG tablet Take 25 mg by mouth daily.     traZODone (DESYREL) 50 MG  tablet Take 25 mg by mouth at bedtime.     apixaban (ELIQUIS) 5 MG TABS tablet Take 1 tablet (5 mg total) by mouth 2 (two) times daily. (Patient not taking: Reported on 08/08/2022) 60 tablet    torsemide (DEMADEX) 20 MG tablet Hold pending outpatient followup due to acute kidney injury. (Patient not taking: Reported on 08/08/2022) 30 tablet 0     Abtx:  Anti-infectives (From admission, onward)    Start     Dose/Rate Route Frequency Ordered Stop   08/09/22 2200  vancomycin (VANCOCIN) IVPB 1000 mg/200 mL premix  Status:  Discontinued        1,000 mg 200 mL/hr over 60 Minutes Intravenous Every 24 hours 08/08/22 2017 08/09/22 1428   08/09/22 0400  meropenem (MERREM) 1 g in sodium chloride 0.9 % 100 mL IVPB        1 g 200 mL/hr over 30 Minutes Intravenous Every 12 hours 08/09/22 0337     08/09/22 0000  ceFEPIme (MAXIPIME) 2 g in sodium chloride 0.9 % 100 mL IVPB  Status:  Discontinued        2 g 200 mL/hr over 30 Minutes Intravenous Every 8 hours 08/08/22 2017 08/09/22 0337   08/08/22 2100  vancomycin (VANCOCIN) IVPB 1000 mg/200 mL premix        1,000 mg 200 mL/hr over 60 Minutes Intravenous  Once 08/08/22 2017 08/09/22 0129   08/08/22 1515  ceFEPIme (MAXIPIME) 2 g in sodium chloride 0.9 % 100 mL IVPB        2 g 200 mL/hr over 30 Minutes Intravenous  Once 08/08/22 1500 08/08/22 1550   08/08/22 1515  vancomycin (VANCOCIN) IVPB 1000 mg/200 mL premix        1,000 mg 200 mL/hr over 60 Minutes Intravenous  Once 08/08/22 1500 08/08/22 1747       REVIEW OF SYSTEMS:  Const: negative fever, negative chills, negative weight loss Eyes: negative diplopia or visual changes, negative eye pain ENT: negative coryza, negative sore throat Resp: negative cough, hemoptysis, dyspnea Cards: negative for chest pain, palpitations, lower extremity edema GU: negative for frequency, dysuria and hematuria GI: Negative for abdominal pain, diarrhea, bleeding, constipation Skin: easy bruising Heme: easy bruising   MS: arthralgias, back pain and muscle weakness Neurolo:negative for headaches, dizziness, vertigo, memory problems  Psych: negative for feelings of anxiety, depression  Endocrine: negative for thyroid, diabetes Allergy/Immunology- as above Objective:  VITALS:  BP 98/61   Pulse 86   Temp 97.9 F (36.6 C)   Resp 18   Ht 5\' 11"  (1.803 m)   Wt 108.9 kg   SpO2 95%   BMI 33.47 kg/m  LDA Foley  PHYSICAL EXAM:  General: Alert, cooperative, no distress, appears young for age Head: Normocephalic, without obvious abnormality, atraumatic. Eyes: Conjunctivae clear, anicteric  sclerae. Pupils are equal ENT HARD of HEaring Poor dentition Neck:  symmetrical, no adenopathy, thyroid: non tender no carotid bruit and no JVD. Back: No CVA tenderness. Lungs: b/l air entry. Heart: s1s2. Abdomen: Soft, non-tender,not distended. Bowel sounds normal. No masses Extremities:b/l blush discoloration of the shins         Skin:        Lymph: Cervical, supraclavicular normal. Neurologic: Grossly non-focal Pertinent Labs Lab Results CBC    Component Value Date/Time   WBC 31.2 (H) 08/08/2022 1415   RBC 3.54 (L) 08/08/2022 1415   HGB 11.0 (L) 08/08/2022 1415   HGB 15.9 06/18/2012 1151   HCT 36.1 (L) 08/08/2022 1415   HCT 47.2 06/18/2012 1151   PLT 450 (H) 08/08/2022 1415   PLT 285 06/18/2012 1151   MCV 102.0 (H) 08/08/2022 1415   MCV 99 06/18/2012 1151   MCH 31.1 08/08/2022 1415   MCHC 30.5 08/08/2022 1415   RDW 21.1 (H) 08/08/2022 1415   RDW 16.2 (H) 06/18/2012 1151   LYMPHSABS 1.2 08/08/2022 1415   LYMPHSABS 1.7 06/18/2012 1151   MONOABS 0.7 08/08/2022 1415   MONOABS 1.1 (H) 06/18/2012 1151   EOSABS 0.2 08/08/2022 1415   EOSABS 0.0 06/18/2012 1151   BASOSABS 0.1 08/08/2022 1415   BASOSABS 0.1 06/18/2012 1151       Latest Ref Rng & Units 08/08/2022    9:50 PM 08/08/2022    2:15 PM 06/28/2022    5:31 AM  CMP  Glucose 70 - 99 mg/dL 64  161  90   BUN 8 - 23 mg/dL 19  19   26    Creatinine 0.61 - 1.24 mg/dL 0.96  0.45  4.09   Sodium 135 - 145 mmol/L 136  137  135   Potassium 3.5 - 5.1 mmol/L 4.6  4.3  3.7   Chloride 98 - 111 mmol/L 105  106  103   CO2 22 - 32 mmol/L 20  22  27    Calcium 8.9 - 10.3 mg/dL 7.8  8.2  7.9   Total Protein 6.5 - 8.1 g/dL 5.0  5.4    Total Bilirubin 0.3 - 1.2 mg/dL 0.8  0.8    Alkaline Phos 38 - 126 U/L 106  101    AST 15 - 41 U/L 25  21    ALT 0 - 44 U/L 11  14        Microbiology: Recent Results (from the past 240 hour(s))  Blood Culture (routine x 2)     Status: None (Preliminary result)   Collection Time: 08/08/22  2:10 PM   Specimen: BLOOD  Result Value Ref Range Status   Specimen Description BLOOD BLOOD RIGHT WRIST  Final   Special Requests   Final    BOTTLES DRAWN AEROBIC AND ANAEROBIC Blood Culture results may not be optimal due to an excessive volume of blood received in culture bottles   Culture  Setup Time   Final    AEROBIC BOTTLE ONLY GRAM NEGATIVE RODS ANAEROBIC BOTTLE ONLY CRITICAL VALUE NOTED.  VALUE IS CONSISTENT WITH PREVIOUSLY REPORTED AND CALLED VALUE. Performed at [facility], [address] [place], [place] [address]    Culture GRAM NEGATIVE RODS  Final   Report Status PENDING  Incomplete  Blood Culture (routine x 2)     Status: None (Preliminary result)   Collection Time: 08/08/22  2:22 PM   Specimen: BLOOD  Result Value Ref Range Status   Specimen Description BLOOD RIGHT ANTECUBITAL  Final   Special Requests   Final    BOTTLES DRAWN AEROBIC AND ANAEROBIC Blood Culture results may not be optimal due to an excessive volume of blood received in culture bottles   Culture  Setup Time   Final    GRAM NEGATIVE RODS IN BOTH AEROBIC AND ANAEROBIC BOTTLES Organism ID to follow CRITICAL RESULT CALLED TO, READ BACK BY AND VERIFIED WITH: TREY GREENWOOD@[id number]  08/09/22 RH Performed at [facility], [address] [place], [place] [address]    Culture GRAM NEGATIVE RODS  Final    Report Status PENDING  Incomplete  Blood Culture ID Panel (Reflexed)     Status: Abnormal   Collection Time: 08/08/22  2:22 PM  Result Value Ref Range Status   Enterococcus faecalis NOT DETECTED NOT DETECTED Final   Enterococcus Faecium NOT DETECTED NOT DETECTED Final   Listeria monocytogenes NOT DETECTED NOT DETECTED Final   Staphylococcus species NOT DETECTED NOT DETECTED Final   Staphylococcus aureus (BCID) NOT DETECTED NOT DETECTED Final   Staphylococcus epidermidis NOT DETECTED NOT DETECTED Final   Staphylococcus lugdunensis NOT DETECTED NOT DETECTED Final   Streptococcus species NOT DETECTED NOT DETECTED Final   Streptococcus agalactiae NOT DETECTED NOT DETECTED Final   Streptococcus pneumoniae NOT DETECTED NOT DETECTED Final   Streptococcus pyogenes NOT DETECTED NOT DETECTED Final   A.calcoaceticus-baumannii NOT DETECTED NOT DETECTED Final   Bacteroides fragilis NOT DETECTED NOT DETECTED Final   Enterobacterales DETECTED (A) NOT DETECTED Final   Enterobacter cloacae complex NOT DETECTED NOT DETECTED Final   Escherichia coli DETECTED (A) NOT DETECTED Final    Comment: CRITICAL RESULT CALLED TO, READ BACK BY AND VERIFIED WITH: TREY GREENWOOD@[id number]  08/09/22 RH    Klebsiella aerogenes NOT DETECTED NOT DETECTED Final   Klebsiella oxytoca NOT DETECTED NOT DETECTED Final   Klebsiella pneumoniae DETECTED (A) NOT DETECTED Final    Comment: CRITICAL RESULT CALLED TO, READ BACK BY AND VERIFIED WITH: TREY GREENWOOD@[id number]  08/09/22 RH    Proteus species NOT DETECTED NOT DETECTED Final   Salmonella species NOT DETECTED NOT DETECTED Final   Serratia marcescens NOT DETECTED NOT DETECTED Final   Haemophilus influenzae NOT DETECTED NOT DETECTED Final   Neisseria meningitidis NOT DETECTED NOT DETECTED Final   Pseudomonas aeruginosa NOT DETECTED NOT DETECTED Final   Stenotrophomonas maltophilia NOT DETECTED NOT DETECTED Final   Candida albicans NOT DETECTED NOT DETECTED Final   Candida auris NOT  DETECTED NOT DETECTED Final   Candida glabrata NOT DETECTED NOT DETECTED Final   Candida krusei NOT DETECTED NOT DETECTED Final   Candida parapsilosis NOT DETECTED NOT DETECTED Final   Candida tropicalis NOT DETECTED NOT DETECTED Final   Cryptococcus neoformans/gattii NOT DETECTED NOT DETECTED Final   CTX-M ESBL DETECTED (A) NOT DETECTED Final    Comment: CRITICAL RESULT CALLED TO, READ BACK BY AND VERIFIED WITH: TREY GREENWOOD@[id number]  08/09/22 RH (NOTE) Extended spectrum beta-lactamase detected. Recommend a carbapenem as initial therapy.      Carbapenem resistance IMP NOT DETECTED NOT DETECTED Final   Carbapenem resistance KPC NOT DETECTED NOT DETECTED Final   Carbapenem resistance NDM NOT DETECTED NOT DETECTED Final   Carbapenem resist OXA 48 LIKE NOT DETECTED NOT DETECTED Final   Carbapenem resistance VIM NOT DETECTED NOT DETECTED Final    Comment: Performed at [facility], [address] [place], [place] [address]    IMAGING RESULTS: CT abdome reviewed No hydronephrosis No diverticulitis I have personally reviewed the films ? Impression/Recommendation ?EColi and klebsiella bacteremia and one of  which is ESBL producing organism- pt is currently on meropenem Likely source could foley related UTI Doubt the legs are the source ? Hyperlactatemia from sepsis  B/l leg hematoma The left was evacuated in may 2024 Culture from the wound bed was MRSA and providencia and they have been adequately treated- no obvious infection now Though CT lower extremity shows Barre hematoma, there is no obvious infection and currently any surgical intervention may not be necessary, but observe closely  Leucocytosis - could be from UTI but observe the leg hematomas closely No diarrhea   Anemia  AKI  Afib rate well controlled on amiodarone  multiple bruises and ecchymosis skin - off DOAC ___________________________________________________ Discussed with patient, requesting  provider Note:  This document was prepared using Dragon voice recognition software and may include unintentional dictation errors.

## 2022-08-09 NOTE — ED Notes (Signed)
Pt given apple juice. Family at bedside and updated.

## 2022-08-09 NOTE — Assessment & Plan Note (Signed)
Being managed by hematology as outpatient.  Hemoglobin seems improving, at 11.  History of B12 deficiency -Continue with home B12 and iron supplement

## 2022-08-09 NOTE — Assessment & Plan Note (Signed)
EF of 30 to 40% on prior echo.  Severely dilated both ventricles. Clinically appears euvolemic -Holding home torsemide and spironolactone due to softer blood pressure -Check BNP

## 2022-08-09 NOTE — Consult Note (Signed)
Hospital Consult    Reason for Consult:  Weak Lower extremity pulses Requesting Physician:  Dr Irena Cords MD  MRN #:  [id number]  History of Present Illness: This is a 82 y.o. male with an extensive medical HX that includes AFIB, HTN, CHF, COPD, Hyperlipidemia, hypothyroidism, Chronic kidney disease, CVA, who presents to [facility] ED with complaints of left leg pain, leukocytosis, and AKI.   Upon workup patient was noted to be hypotensive which initialy responded to fluids, elevated AKI at 1.81 from a baseline of 1.0, and an elevated lactic acid of 9.0 currently down to 6.4.   On exam in the ER patient was found sleeping. He endorses just not feeling well. He endorses left leg pain but nothing more than his normal pain. He states "I just want to sleep". Vitals are stable with low blood pressure, systolics in the 100's.   Past Medical History:  Diagnosis Date   A-fib (HCC)    Cellulitis    CHF (congestive heart failure) (HCC)    COPD (chronic obstructive pulmonary disease) (HCC)    Difficult intubation    Hyperlipidemia    Hypertension    Hypothyroidism    Kidney disease    Sleep apnea    Stroke [facility])    Thyroid disease     Past Surgical History:  Procedure Laterality Date   APPLICATION OF WOUND VAC Left 06/24/2022   Procedure: APPLICATION OF WOUND VAC;  Surgeon: Renford Dills, MD;  Location: [facility] ORS;  Service: Vascular;  Laterality: Left;   APPLICATION OF WOUND VAC  06/28/2022   Procedure: APPLICATION OF WOUND VAC;  Surgeon: Renford Dills, MD;  Location: [facility] ORS;  Service: Vascular;;   CARDIAC CATHETERIZATION     CATARACT EXTRACTION W/ INTRAOCULAR LENS  IMPLANT, BILATERAL     COLONOSCOPY  2008   HEMATOMA EVACUATION Left 06/24/2022   Procedure: EVACUATION HEMATOMA;  Surgeon: Renford Dills, MD;  Location: [facility] ORS;  Service: Vascular;  Laterality: Left;  wound vac   HERNIA REPAIR     bilateral inguinal hernia/ [place]   HERNIA REPAIR  02/02/2015   18 x 28 cm  ventral light mesh placed laparoscopically.   INCISION AND DRAINAGE Left 06/24/2022   Procedure: INCISION AND DRAINAGE;  Surgeon: Renford Dills, MD;  Location: [facility] ORS;  Service: Vascular;  Laterality: Left;   TONSILLECTOMY     UMBILICAL HERNIA REPAIR N/A 02/02/2015   Procedure: HERNIA REPAIR UMBILICAL ADULT;  Surgeon: Earline Mayotte, MD;  Location: [facility] ORS;  Service: General;  Laterality: N/A;   VENTRAL HERNIA REPAIR N/A 02/02/2015   Procedure: LAPAROSCOPIC VENTRAL HERNIA;  Surgeon: Earline Mayotte, MD;  Location: [facility] ORS;  Service: General;  Laterality: N/A;   vp shunt placement  1979    Allergies  Allergen Reactions   Diltiazem Hcl Itching and Other (See Comments)    edema    Prior to Admission medications   Medication Sig Start Date End Date Taking? Authorizing Provider  acetaminophen (TYLENOL) 500 MG tablet Take 500 mg by mouth every 12 (twelve) hours.   Yes [provider]  albuterol (VENTOLIN HFA) 108 (90 Base) MCG/ACT inhaler Inhale 2 puffs into the lungs every 6 (six) hours as needed for wheezing or shortness of breath. 02/17/21  Yes Darlin Priestly, MD  alum & mag hydroxide-simeth (MAALOX/MYLANTA) 200-200-20 MG/5ML suspension Take 30 mLs by mouth every 6 (six) hours as needed for indigestion or heartburn.   Yes [provider]  amiodarone (PACERONE) 200 MG tablet  Take 200 mg by mouth every morning.   Yes [provider]  Capsaicin (ASPERCREME PAIN RELIEF PATCH EX) Apply 1 patch topically every 12 (twelve) hours. (Apply to right shoulder - 12 hours on/12 hours off)   Yes [provider]  Carboxymethylcellulose Sod PF (REFRESH CELLUVISC) 1 % GEL Place 1 drop into the right eye every 12 (twelve) hours.   Yes [provider]  Cyanocobalamin 1000 MCG/ML LIQD Take 1,000 mcg by mouth daily.   Yes [provider]  ferrous sulfate 325 (65 FE) MG tablet Take 1 tablet (325 mg total) by mouth daily. 12/09/21  Yes Chesley Noon, MD   fluticasone [facility]) 50 MCG/ACT nasal spray Place 2 sprays into both nostrils daily.   Yes [provider]  Fluticasone-Umeclidin-Vilant (TRELEGY ELLIPTA) 100-62.5-25 MCG/INH AEPB Inhale 1 puff into the lungs daily.   Yes [provider]  HYDROcodone-acetaminophen (NORCO/VICODIN) 5-325 MG tablet Take 1 tablet by mouth every 4 (four) hours as needed for moderate pain.   Yes [provider]  levothyroxine (SYNTHROID) 88 MCG tablet Take 88 mcg by mouth daily.    Yes [provider]  loratadine (CLARITIN) 10 MG tablet Take 10 mg by mouth daily.   Yes [provider]  Menthol, Topical Analgesic, (GOLD BOND EXTRA STRENGTH) 0.8 % POWD Apply 1 application  topically 2 (two) times daily. (Apply to back)   Yes [provider]  metoprolol succinate (TOPROL-XL) 50 MG 24 hr tablet Take 50 mg by mouth daily.   Yes [provider]  Multiple Vitamin (MULTI-VITAMINS) TABS Take 1 tablet by mouth daily.   Yes [provider]  omeprazole (PRILOSEC) 20 MG capsule Take 20 mg by mouth daily before supper.   Yes [provider]  polyethylene glycol (MIRALAX / GLYCOLAX) 17 g packet Take 17 g by mouth daily as needed. Patient taking differently: Take 17 g by mouth 2 (two) times daily. 08/03/18  Yes Ojie, Jude, MD  potassium chloride SA (KLOR-CON M) 20 MEQ tablet Take 20 mEq by mouth daily.   Yes [provider]  Pramox-PE-Glycerin-Petrolatum (HEMORRHOIDAL) 1-0.25-14.4-15 % CREA Place 1 application  rectally every 6 (six) hours as needed (pain).   Yes [provider]  rivaroxaban (XARELTO) 20 MG TABS tablet Take 20 mg by mouth daily with supper.   Yes [provider]  senna-docusate (SENOKOT-S) 8.6-50 MG tablet Take 1 tablet by mouth 2 (two) times daily.   Yes [provider]  sertraline (ZOLOFT) 50 MG tablet Take 50 mg by mouth at bedtime.   Yes [provider]  spironolactone (ALDACTONE) 25 MG  tablet Take 25 mg by mouth daily.   Yes [provider]  traZODone (DESYREL) 50 MG tablet Take 25 mg by mouth at bedtime.   Yes [provider]  apixaban (ELIQUIS) 5 MG TABS tablet Take 1 tablet (5 mg total) by mouth 2 (two) times daily. Patient not taking: Reported on 08/08/2022 07/12/22   Delfino Lovett, MD  torsemide (DEMADEX) 20 MG tablet Hold pending outpatient followup due to acute kidney injury. Patient not taking: Reported on 08/08/2022 02/17/21   Darlin Priestly, MD    Social History   Socioeconomic History   Marital status: Divorced    Spouse name: Not on file   Number of children: Not on file   Years of education: Not on file   Highest education level: Not on file  Occupational History   Not on file  Tobacco Use   Smoking status: Former  Packs/day: 1.00    Years: 41.00    Additional pack years: 0.00    Total pack years: 41.00    Types: Cigarettes    Quit date: 45    Years since quitting: 25.4   Smokeless tobacco: Never  Vaping Use   Vaping Use: Never used  Substance and Sexual Activity   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   Drug use: No   Sexual activity: Not Currently  Other Topics Concern   Not on file  Social History Narrative   Not on file   Social Determinants of Health   Financial Resource Strain: Not on file  Food Insecurity: No Food Insecurity (08/09/2022)   Hunger Vital Sign    Worried About Running Out of Food in the Last Year: Never true    Ran Out of Food in the Last Year: Never true  Transportation Needs: No Transportation Needs (08/09/2022)   PRAPARE - Administrator, Civil Service (Medical): No    Lack of Transportation (Non-Medical): No  Physical Activity: Not on file  Stress: Not on file  Social Connections: Not on file  Intimate Partner Violence: Not At Risk (08/09/2022)   Humiliation, Afraid, Rape, and Kick questionnaire    Fear of Current or Ex-Partner: No    Emotionally Abused: No    Physically  Abused: No    Sexually Abused: No     Family History  Problem Relation Age of Onset   Heart disease Mother    Alcohol abuse Father    Bladder Cancer Sister     ROS: Otherwise negative unless mentioned in HPI  Physical Examination  Vitals:   08/09/22 1217 08/09/22 1250  BP: (!) 124/91 108/77  Pulse: 88   Resp: 20   Temp:    SpO2: 94%    Body mass index is 33.47 kg/m.  General:  WDWN in NAD Gait: Not observed HENT: WNL, normocephalic Pulmonary: normal non-labored breathing, without Rales, rhonchi,  wheezing Cardiac: irregular, Hx AFIB, without  Murmurs, rubs or gallops; without carotid bruits Abdomen: Positive bowel sounds throughout, soft, NT/ND, no masses Skin: without rashes Vascular Exam/Pulses: Unable to palpate bilateral lower extremity pulses due to +2 edema. Warm to touch Extremities: without ischemic changes, without Gangrene , without cellulitis; without open wounds;  Musculoskeletal: no muscle wasting or atrophy  Neurologic: A&O X 3;  No focal weakness or paresthesias are detected; speech is fluent/normal Psychiatric:  The pt has Normal affect. Lymph:  Unremarkable  CBC    Component Value Date/Time   WBC 31.2 (H) 08/08/2022 1415   RBC 3.54 (L) 08/08/2022 1415   HGB 11.0 (L) 08/08/2022 1415   HGB 15.9 06/18/2012 1151   HCT 36.1 (L) 08/08/2022 1415   HCT 47.2 06/18/2012 1151   PLT 450 (H) 08/08/2022 1415   PLT 285 06/18/2012 1151   MCV 102.0 (H) 08/08/2022 1415   MCV 99 06/18/2012 1151   MCH 31.1 08/08/2022 1415   MCHC 30.5 08/08/2022 1415   RDW 21.1 (H) 08/08/2022 1415   RDW 16.2 (H) 06/18/2012 1151   LYMPHSABS 1.2 08/08/2022 1415   LYMPHSABS 1.7 06/18/2012 1151   MONOABS 0.7 08/08/2022 1415   MONOABS 1.1 (H) 06/18/2012 1151   EOSABS 0.2 08/08/2022 1415   EOSABS 0.0 06/18/2012 1151   BASOSABS 0.1 08/08/2022 1415   BASOSABS 0.1 06/18/2012 1151    BMET    Component Value Date/Time   NA 136 08/08/2022 2150   NA 138 06/20/2012  0757   K 4.6  08/08/2022 2150   K 2.9 (L) 06/20/2012 0757   CL 105 08/08/2022 2150   CL 97 (L) 06/20/2012 0757   CO2 20 (L) 08/08/2022 2150   CO2 36 (H) 06/20/2012 0757   GLUCOSE 64 (L) 08/08/2022 2150   GLUCOSE 87 06/20/2012 0757   BUN 19 08/08/2022 2150   BUN 34 (H) 06/20/2012 0757   CREATININE 1.70 (H) 08/08/2022 2150   CREATININE 1.59 (H) 07/23/2013 1345   CALCIUM 7.8 (L) 08/08/2022 2150   CALCIUM 8.1 (L) 06/20/2012 0757   GFRNONAA 40 (L) 08/08/2022 2150   GFRNONAA 42 (L) 07/23/2013 1345   GFRAA >60 12/14/2018 0452   GFRAA 49 (L) 07/23/2013 1345    COAGS: Lab Results  Component Value Date   INR 1.6 (H) 08/09/2022   INR 1.5 (H) 06/24/2022   INR 1.8 (H) 06/23/2022     Non-Invasive Vascular Imaging:   EXAM: RENAL / URINARY TRACT ULTRASOUND COMPLETE   COMPARISON:  January 05, 2015   FINDINGS: Evaluation is limited by limited acoustic windows and inability to cooperate fully with exam.   Right Kidney:   Renal measurements: 10.0 x 5.1 x 4.9 cm = volume: 131 mL. Echogenicity within normal limits. No mass or hydronephrosis visualized.   Left Kidney:   Renal measurements: 10.5 x 5.9 x 6.1 cm = volume: 196 mL. Echogenicity within normal limits. No mass or hydronephrosis visualized.   Bladder:   Completely decompressed around a Foley catheter.   Other:   None.   IMPRESSION: No hydronephrosis.  EXAM: CT OF THE LOWER BILATERAL EXTREMITY WITHOUT CONTRAST   TECHNIQUE: Multidetector CT imaging of the lower bilateral extremity was performed according to the standard protocol.   RADIATION DOSE REDUCTION: This exam was performed according to the departmental dose-optimization program which includes automated exposure control, adjustment of the mA and/or kV according to patient size and/or use of iterative reconstruction technique.   COMPARISON:  CT dated 06/22/2022.   FINDINGS: Bones/Joint/Cartilage   Right:   There is no acute fracture or dislocation. The bones  are osteopenic. There is moderate arthritic changes of the right knee. No joint effusion.   Left:   There is no acute fracture or dislocation. The bones are osteopenic. There is moderate arthritic changes of the left knee no joint effusion.   Ligaments   Suboptimally assessed by CT.   Muscles and Tendons   Right:   No acute findings. No intramuscular fluid collection or hematoma. There is fatty atrophy.   Left:   No acute findings. No intramuscular hematoma or fluid collection. Fatty atrophy of the musculature.   Soft tissues   Right:   There is diffuse skin thickening and subcutaneous edema of the right lower extremity most consistent with cellulitis. Clinical correlation is recommended. There is a intermediate attenuating collection in the subcutaneous soft tissues of the proximal medial calf measuring 6.9 x 2.5 cm (previously 8.9 x 3.6 cm) in greatest axial dimensions and 6.7 cm in craniocaudal length most consistent with a hematoma. No soft tissue gas.   Left:   Diffuse subcutaneous edema of the left lower extremity. Interval resolution of the previously seen subcutaneous collection/hematoma. No fluid collection. No soft tissue gas.   IMPRESSION: 1. Diffuse skin thickening and subcutaneous edema of the right lower extremity most consistent with cellulitis. Interval decrease in the size of the subcutaneous collection/hematoma in the medial proximal right calf compared to prior CT. 2. Diffuse subcutaneous edema of the left lower extremity.  Interval resolution of the previously seen collections/hematoma and skin wound. No fluid collection. No soft tissue gas. 3. No acute osseous pathology.    Statin:  No. Beta Blocker:  Yes.   Aspirin:  No. ACEI:  No. ARB:  No. CCB use:  No Other antiplatelets/anticoagulants:  Yes.   Xarelto   ASSESSMENT/PLAN: This is a 82 y.o. male who presents to [facility] ED with left leg pain that is nothing more than his normal. He  has doppler bilateral lower extremity pulses and they are warm to touch.   PLAN: There is no need for any intervention at this point. Patients sepsis is most likely the cause of his hypotension any decreased pulses to his bilateral lower extremities more so his left than right. If patients left leg pulses are unobtainable we can look to angiogram it next week. Medical treatment at this point is paramount prior to intervention.    -I discussed the plan in detail with Dr Festus Barren MD and he agrees with the plan.    Marcie Bal [facility] 08/09/2022 12:54 PM

## 2022-08-10 DIAGNOSIS — B9620 Unspecified Escherichia coli [E. coli] as the cause of diseases classified elsewhere: Secondary | ICD-10-CM | POA: Diagnosis not present

## 2022-08-10 DIAGNOSIS — A419 Sepsis, unspecified organism: Secondary | ICD-10-CM | POA: Diagnosis not present

## 2022-08-10 DIAGNOSIS — N390 Urinary tract infection, site not specified: Secondary | ICD-10-CM | POA: Diagnosis not present

## 2022-08-10 DIAGNOSIS — I959 Hypotension, unspecified: Secondary | ICD-10-CM | POA: Diagnosis not present

## 2022-08-10 DIAGNOSIS — R7881 Bacteremia: Secondary | ICD-10-CM

## 2022-08-10 DIAGNOSIS — Z1612 Extended spectrum beta lactamase (ESBL) resistance: Secondary | ICD-10-CM

## 2022-08-10 DIAGNOSIS — D72829 Elevated white blood cell count, unspecified: Secondary | ICD-10-CM | POA: Diagnosis not present

## 2022-08-10 DIAGNOSIS — N179 Acute kidney failure, unspecified: Secondary | ICD-10-CM | POA: Diagnosis not present

## 2022-08-10 DIAGNOSIS — B961 Klebsiella pneumoniae [K. pneumoniae] as the cause of diseases classified elsewhere: Secondary | ICD-10-CM

## 2022-08-10 DIAGNOSIS — E872 Acidosis, unspecified: Secondary | ICD-10-CM | POA: Diagnosis not present

## 2022-08-10 DIAGNOSIS — R6521 Severe sepsis with septic shock: Secondary | ICD-10-CM | POA: Diagnosis not present

## 2022-08-10 DIAGNOSIS — I4891 Unspecified atrial fibrillation: Secondary | ICD-10-CM | POA: Diagnosis not present

## 2022-08-10 LAB — BASIC METABOLIC PANEL
Anion gap: 7 (ref 5–15)
BUN: 22 mg/dL (ref 8–23)
CO2: 21 mmol/L — ABNORMAL LOW (ref 22–32)
Calcium: 7.4 mg/dL — ABNORMAL LOW (ref 8.9–10.3)
Chloride: 109 mmol/L (ref 98–111)
Creatinine, Ser: 1.44 mg/dL — ABNORMAL HIGH (ref 0.61–1.24)
GFR, Estimated: 49 mL/min — ABNORMAL LOW (ref >60)
Glucose, Bld: 95 mg/dL (ref 70–99)
Potassium: 3.4 mmol/L — ABNORMAL LOW (ref 3.5–5.1)
Sodium: 137 mmol/L (ref 135–145)

## 2022-08-10 LAB — CULTURE, BLOOD (ROUTINE X 2)

## 2022-08-10 LAB — BLOOD CULTURE ID PANEL (REFLEXED) - BCID2
Candida albicans: NOT DETECTED
Candida glabrata: NOT DETECTED
Candida krusei: NOT DETECTED
Candida tropicalis: NOT DETECTED
Carbapenem resist OXA 48 LIKE: NOT DETECTED
Carbapenem resistance NDM: NOT DETECTED
Carbapenem resistance VIM: NOT DETECTED
Enterococcus Faecium: NOT DETECTED
Enterococcus faecalis: NOT DETECTED
Escherichia coli: DETECTED — AB
Haemophilus influenzae: NOT DETECTED
Klebsiella aerogenes: NOT DETECTED
Listeria monocytogenes: NOT DETECTED
Neisseria meningitidis: NOT DETECTED
Proteus species: NOT DETECTED
Serratia marcescens: NOT DETECTED
Staphylococcus aureus (BCID): NOT DETECTED
Staphylococcus lugdunensis: NOT DETECTED
Staphylococcus species: NOT DETECTED
Stenotrophomonas maltophilia: NOT DETECTED
Streptococcus agalactiae: NOT DETECTED
Streptococcus species: NOT DETECTED

## 2022-08-10 LAB — CBC
HCT: 28.4 % — ABNORMAL LOW (ref 39.0–52.0)
Hemoglobin: 8.8 g/dL — ABNORMAL LOW (ref 13.0–17.0)
MCH: 30.7 pg (ref 26.0–34.0)
MCHC: 31 g/dL (ref 30.0–36.0)
MCV: 99 fL (ref 80.0–100.0)
Platelets: 347 K/uL (ref 150–400)
RBC: 2.87 MIL/uL — ABNORMAL LOW (ref 4.22–5.81)
RDW: 20.3 % — ABNORMAL HIGH (ref 11.5–15.5)
WBC: 15.3 K/uL — ABNORMAL HIGH (ref 4.0–10.5)
nRBC: 0 % (ref 0.0–0.2)

## 2022-08-10 LAB — LACTIC ACID, PLASMA: Lactic Acid, Venous: 2.9 mmol/L (ref 0.5–1.9)

## 2022-08-10 LAB — MAGNESIUM: Magnesium: 2 mg/dL (ref 1.7–2.4)

## 2022-08-10 LAB — URINE CULTURE

## 2022-08-10 LAB — MRSA NEXT GEN BY PCR, NASAL: MRSA by PCR Next Gen: DETECTED — AB

## 2022-08-10 MED ORDER — GERHARDT'S BUTT CREAM
TOPICAL_CREAM | Freq: Three times a day (TID) | CUTANEOUS | Status: DC
  Administered 2022-08-14: 1 via TOPICAL
  Filled 2022-08-10 (×4): qty 1

## 2022-08-10 MED ORDER — POTASSIUM CHLORIDE 20 MEQ PO PACK
40.0000 meq | PACK | Freq: Once | ORAL | Status: AC
  Administered 2022-08-10: 40 meq via ORAL
  Filled 2022-08-10: qty 2

## 2022-08-10 MED ORDER — SODIUM CHLORIDE 0.9 % IV SOLN
1.0000 g | Freq: Three times a day (TID) | INTRAVENOUS | Status: AC
  Administered 2022-08-10 – 2022-08-11 (×5): 1 g via INTRAVENOUS
  Filled 2022-08-10 (×5): qty 20

## 2022-08-10 MED ORDER — CHLORHEXIDINE GLUCONATE CLOTH 2 % EX PADS
6.0000 | MEDICATED_PAD | Freq: Every day | CUTANEOUS | Status: DC
  Administered 2022-08-10 – 2022-08-13 (×3): 6 via TOPICAL

## 2022-08-10 NOTE — Progress Notes (Signed)
PHARMACY NOTE:  ANTIMICROBIAL RENAL DOSAGE ADJUSTMENT  Current antimicrobial regimen includes a mismatch between antimicrobial dosage and estimated renal function.  As per policy approved by the Pharmacy & Therapeutics and Medical Executive Committees, the antimicrobial dosage will be adjusted accordingly.  Current antimicrobial dosage:  Meropenem 1gm IV q12h  Indication: ESBL bacteremia   Renal Function:  Estimated Creatinine Clearance: 50.5 mL/min (A) (by C-G formula based on SCr of 1.44 mg/dL (H)). []      On intermittent HD, scheduled: []      On CRRT    Antimicrobial dosage has been changed to:  Meropenem 1gm IV q8h  Additional comments:   Thank you for allowing pharmacy to be a part of this patient's care.  Juliette Alcide, PharmD, BCPS, BCIDP Work Cell: [phone] 08/10/2022 12:20 PM

## 2022-08-10 NOTE — Progress Notes (Addendum)
Removed foley catheter per MD order- will monitor urination.  Bladder due at 1100 pm if does not urinate

## 2022-08-10 NOTE — Consult Note (Addendum)
WOC Nurse Consult Note: patient admitted to [facility] 5/8-5/15/2024 for hematoma to Left posterior leg that was managed by vascular surgery; last seen by vascular 07/29/2022 at which time wound vac was removed and Aquacel every other day ordered for wound  Reason for Consult: multiple wounds  Wound type: 1.  R posterior arm total area 11 cm x 6 cm erythema, ecchymosis with 4 cm x 2 cm intact serous blister; R lateral arm total area 8 cm x 4 cm erythema ecchymosis with 4 cm x 3 cm intact serous blister  2.  Stage 2 Pressure Injury Coccyx 1 cm x 0.5 cm x 0.1 cm 100% pink and moist  3.  L lateral leg 11 cm x 6 cm partial thickness skin loss, ?traumatic per previous notes stating this area was hit by wheelchair 100% pink and moist  4. L posterior leg (just below posterior knee) 11 cm x 4 cm x 0.1 cm healing full thickness, traumatic after being hit with wheelchair 100% pink and moist  5.  R medial lower leg partial thickness skin loss 16 cm x 7 cm 100% pink and moist  6  L upper posterior thigh with linear areas of partial thickness skin loss largest 16 cm x 0.5 cm 100% pink and moist  7.  R upper posterior thigh total area 4 cm x 4 cm erythema with scattered areas of partial thickness skin loss largest 0.5 cm x 0.5 cm  8.  L upper back Deep Tissue Pressure Injury total area 12 cm x 9 cm erythema with scattered areas of dark purple discoloration, skin intact; L mid back Deep Tissue Pressure Injury 6 cm x 8 cm total area erythema with scattered areas of dark purple discoloration with a 0.5 cm x 0.5 cm area of partial thickness skin loss  9.  L upper arm partial thickness skin loss 1 cm x 1 cm 100% pink and moist with an intact serous blister adjacent  Pressure Injury POA: Yes: Drainage (amount, consistency, odor) bilateral arms noted to be weeping, minimal serosanguinous from other wounds  Periwound: bilateral legs with chronic appearing discoloration; patient with known peripheral artery disease  Some Moisture  Associated Skin Damage noted to buttocks, coccyx, groin and under scrotum with erythema and scattered areas of partial thickness skin loss  L24A2 - Due to fecal, urinary or dual incontinence  Dressing procedure/placement/frequency:  Clean R lower leg area of partial thickness skin loss with NS, dry and apply Xeroform gauze to open area every other day, cover with ABD pad and wrap with Kerlix roll gauze beginning at just above toes and ending right below knee.   Clean L lateral and posterior leg wounds with NS, apply silver hydrofiber (Aquacel [place] [phone]) to wound beds every other day, cover with ABD pad and wrap with Kerlix roll gauze beginning just above toes and ending right below knee. MAY NEED TO MOISTEN SILVER PRIOR TO REMOVAL IF STUCK TO WOUND BED.  L upper posterior thigh, R upper posterior thigh, L upper back areas of Deep Tissue Pressure Injury cover with Xeroform gauze [place] [phone]) daily and secure with silicone foam dressings. May lift foam daily to replace Xeroform.  Change silicone foam q3 days and prn soiling.  Bilateral arms cleanse with  soap and water and dry. Place Vaseline gauze [place] [phone]) to areas of blister every other day cover with ABD pads and wrap with Kerlix roll gauze.     Will order Gerhardt's Butt Cream for MASD.  Patient  is on low air loss mattress at this time for pressure redistribution and moisture management.   POC discussed with bedside nurse.  WOC team will not follow at this time.  Re-consult if further needs arise.   Thank you,    Priscella Mann MSN, RN-BC, [employer] [phone]

## 2022-08-10 NOTE — Assessment & Plan Note (Signed)
Patient met severe sepsis criteria with septic shock with leukocytosis, AKI, and severe lactic acidosis, peaked at more than 9, still elevated at 2.9 Likely secondary to UTI, patient came with Foley catheter in place which was replaced in ED. Blood cultures with E. coli and Klebsiella pneumonia, ESBL Urine cultures also growing Klebsiella pneumonia Initially received cefepime and vancomycin which was later switched to meropenem.  Vancomycin was discontinued -ID is on board -Remove Foley catheter and give him a voiding trial -Continue meropenem -Follow-up final culture results

## 2022-08-10 NOTE — Progress Notes (Signed)
Date of Admission:  08/08/2022     ID: Daniel Eaton. is a 82 y.o. male with   Principal Problem:   Severe sepsis with septic shock (HCC) Active Problems:   Atrial fibrillation (HCC)   Essential hypertension   Acute renal failure superimposed on stage 3a chronic kidney disease (HCC)   Cardiomyopathy (HCC)   Hypothyroid   Macrocytic anemia   Lactic acidosis   PAD (peripheral artery disease) (HCC)   Hypotension    Subjective: Doing okay  Medications:   amiodarone  200 mg Oral q morning   Chlorhexidine Gluconate Cloth  6 each Topical Q0600   vitamin B-12  1,000 mcg Oral Daily   ferrous sulfate  325 mg Oral Daily   fluticasone  2 spray Each Nare Daily   fluticasone furoate-vilanterol  1 puff Inhalation Daily   And   umeclidinium bromide  1 puff Inhalation Daily   Gerhardt's butt cream   Topical TID   levothyroxine  88 mcg Oral Daily   loratadine  10 mg Oral Daily   multivitamin with minerals  1 tablet Oral Q lunch   pantoprazole  40 mg Oral Daily   rivaroxaban  20 mg Oral Q supper   sertraline  50 mg Oral QHS   traZODone  25 mg Oral QHS    Objective: Vital signs in last 24 hours: Patient Vitals for the past 24 hrs:  BP Temp Temp src Pulse Resp SpO2 Weight  08/10/22 1215 101/78 98.7 F (37.1 C) Oral (!) 109 18 99 % --  08/10/22 0554 -- -- -- -- -- -- 112.6 kg  08/09/22 2301 (!) 99/58 98.4 F (36.9 C) -- 97 20 98 % --  08/09/22 2239 128/70 98.2 F (36.8 C) -- 99 20 99 % --  08/09/22 1712 (!) 123/56 -- -- 91 19 99 % --  08/09/22 1712 (!) 123/56 98 F (36.7 C) Oral 91 -- 99 % --  08/09/22 1552 (!) 95/58 -- -- 94 19 97 % --     LDA Foley   PHYSICAL EXAM:  General: Alert, cooperative, no distress, appears stated age.  Lungs: Clear to auscultation bilaterally. No Wheezing or Rhonchi. No rales. Heart: Regular rate and rhythm, no murmur, rub or gallop. Abdomen: Soft, non-tender,not distended. Bowel sounds normal. No masses Extremities: healing wounds  legs Skin: multiple bruises , ecchymoses Wounds      Left leg    Left shin  Rt shin   Lymph: Cervical, supraclavicular normal. Neurologic: unable to assess in detail  Lab Results    Latest Ref Rng & Units 08/10/2022    4:32 AM 08/08/2022    2:15 PM 06/28/2022    5:31 AM  CBC  WBC 4.0 - 10.5 K/uL 15.3  31.2  10.6   Hemoglobin 13.0 - 17.0 g/dL 8.8  16.1  8.1   Hematocrit 39.0 - 52.0 % 28.4  36.1  26.5   Platelets 150 - 400 K/uL 347  450  257        Latest Ref Rng & Units 08/10/2022    4:32 AM 08/08/2022    9:50 PM 08/08/2022    2:15 PM  CMP  Glucose 70 - 99 mg/dL 95  64  096   BUN 8 - 23 mg/dL 22  19  19    Creatinine 0.61 - 1.24 mg/dL 0.45  4.09  8.11   Sodium 135 - 145 mmol/L 137  136  137   Potassium 3.5 - 5.1 mmol/L 3.4  4.6  4.3   Chloride 98 - 111 mmol/L 109  105  106   CO2 22 - 32 mmol/L 21  20  22    Calcium 8.9 - 10.3 mg/dL 7.4  7.8  8.2   Total Protein 6.5 - 8.1 g/dL  5.0  5.4   Total Bilirubin 0.3 - 1.2 mg/dL  0.8  0.8   Alkaline Phos 38 - 126 U/L  106  101   AST 15 - 41 U/L  25  21   ALT 0 - 44 U/L  11  14       Microbiology: 08/08/22 BC - Ecoli/klebsiella ESBL UC klebsiella  Studies/Results: CT ABDOMEN PELVIS WO CONTRAST  Result Date: 08/08/2022 CLINICAL DATA:  Sepsis sepsis/ aki EXAM: CT ABDOMEN AND PELVIS WITHOUT CONTRAST TECHNIQUE: Multidetector CT imaging of the abdomen and pelvis was performed following the standard protocol without IV contrast. RADIATION DOSE REDUCTION: This exam was performed according to the departmental dose-optimization program which includes automated exposure control, adjustment of the mA and/or kV according to patient size and/or use of iterative reconstruction technique. COMPARISON:  None Available. FINDINGS: Lower chest: Trace right and trace to small left pleural effusion. Bilateral lower lobe passive atelectasis. Coronary artery calcification. Hepatobiliary: No focal liver abnormality. No gallstones, gallbladder wall  thickening, or pericholecystic fluid. No biliary dilatation. Pancreas: No focal lesion. Normal pancreatic contour. No surrounding inflammatory changes. No main pancreatic ductal dilatation. Spleen: Normal in size without focal abnormality. Adrenals/Urinary Tract: No adrenal nodule bilaterally. No nephrolithiasis and no hydronephrosis. No definite contour-deforming renal mass. No ureterolithiasis or hydroureter. The urinary bladder is Foley decompressed with Foley catheter tip and inflated balloon terminating within its lumen. Stomach/Bowel: Stomach is within normal limits. No evidence of bowel wall thickening or dilatation. Colonic diverticulosis. Appendix appears normal. Vascular/Lymphatic: No abdominal aorta or iliac aneurysm. Moderate atherosclerotic plaque of the aorta and its branches. No abdominal, pelvic, or inguinal lymphadenopathy. Reproductive: No mass. Other: No intraperitoneal free fluid. No intraperitoneal free gas. No organized fluid collection. Musculoskeletal: Small fat containing right inguinal hernia. Extensive subcutaneus soft tissue edema along bilateral hips. No cortical erosion or destruction suspect acute osteomyelitis. No suspicious lytic or blastic osseous lesions. No acute displaced fracture. Multilevel degenerative changes of the spine. Grade 1 anterolisthesis of L4 on L5. IMPRESSION: 1. Trace right and trace to small left pleural effusion. 2. No acute intra-abdominal or intrapelvic abnormality with limited evaluation on this noncontrast study. 3.  Aortic Atherosclerosis (ICD10-I70.0). 4. Small fat containing right inguinal hernia. 5. Nonspecific extensive subcutaneus soft tissue edema along bilateral hips. Electronically Signed   By: Tish Frederickson M.D.   On: 08/08/2022 21:37   CT FEMUR RIGHT WO CONTRAST  Result Date: 08/08/2022 CLINICAL DATA:  Wound/sepsis. EXAM: CT OF THE LOWER BILATERAL EXTREMITY WITHOUT CONTRAST TECHNIQUE: Multidetector CT imaging of the lower bilateral extremity  was performed according to the standard protocol. RADIATION DOSE REDUCTION: This exam was performed according to the departmental dose-optimization program which includes automated exposure control, adjustment of the mA and/or kV according to patient size and/or use of iterative reconstruction technique. COMPARISON:  CT dated 06/22/2022. FINDINGS: Bones/Joint/Cartilage Right: There is no acute fracture or dislocation. The bones are osteopenic. There is moderate arthritic changes of the right knee. No joint effusion. Left: There is no acute fracture or dislocation. The bones are osteopenic. There is moderate arthritic changes of the left knee no joint effusion. Ligaments Suboptimally assessed by CT. Muscles and Tendons Right: No acute findings. No intramuscular fluid collection or  hematoma. There is fatty atrophy. Left: No acute findings. No intramuscular hematoma or fluid collection. Fatty atrophy of the musculature. Soft tissues Right: There is diffuse skin thickening and subcutaneous edema of the right lower extremity most consistent with cellulitis. Clinical correlation is recommended. There is a intermediate attenuating collection in the subcutaneous soft tissues of the proximal medial calf measuring 6.9 x 2.5 cm (previously 8.9 x 3.6 cm) in greatest axial dimensions and 6.7 cm in craniocaudal length most consistent with a hematoma. No soft tissue gas. Left: Diffuse subcutaneous edema of the left lower extremity. Interval resolution of the previously seen subcutaneous collection/hematoma. No fluid collection. No soft tissue gas. IMPRESSION: 1. Diffuse skin thickening and subcutaneous edema of the right lower extremity most consistent with cellulitis. Interval decrease in the size of the subcutaneous collection/hematoma in the medial proximal right calf compared to prior CT. 2. Diffuse subcutaneous edema of the left lower extremity. Interval resolution of the previously seen collections/hematoma and skin wound.  No fluid collection. No soft tissue gas. 3. No acute osseous pathology. Electronically Signed   By: Elgie Collard M.D.   On: 08/08/2022 21:29   CT FEMUR LEFT WO CONTRAST  Result Date: 08/08/2022 CLINICAL DATA:  Wound/sepsis. EXAM: CT OF THE LOWER BILATERAL EXTREMITY WITHOUT CONTRAST TECHNIQUE: Multidetector CT imaging of the lower bilateral extremity was performed according to the standard protocol. RADIATION DOSE REDUCTION: This exam was performed according to the departmental dose-optimization program which includes automated exposure control, adjustment of the mA and/or kV according to patient size and/or use of iterative reconstruction technique. COMPARISON:  CT dated 06/22/2022. FINDINGS: Bones/Joint/Cartilage Right: There is no acute fracture or dislocation. The bones are osteopenic. There is moderate arthritic changes of the right knee. No joint effusion. Left: There is no acute fracture or dislocation. The bones are osteopenic. There is moderate arthritic changes of the left knee no joint effusion. Ligaments Suboptimally assessed by CT. Muscles and Tendons Right: No acute findings. No intramuscular fluid collection or hematoma. There is fatty atrophy. Left: No acute findings. No intramuscular hematoma or fluid collection. Fatty atrophy of the musculature. Soft tissues Right: There is diffuse skin thickening and subcutaneous edema of the right lower extremity most consistent with cellulitis. Clinical correlation is recommended. There is a intermediate attenuating collection in the subcutaneous soft tissues of the proximal medial calf measuring 6.9 x 2.5 cm (previously 8.9 x 3.6 cm) in greatest axial dimensions and 6.7 cm in craniocaudal length most consistent with a hematoma. No soft tissue gas. Left: Diffuse subcutaneous edema of the left lower extremity. Interval resolution of the previously seen subcutaneous collection/hematoma. No fluid collection. No soft tissue gas. IMPRESSION: 1. Diffuse skin  thickening and subcutaneous edema of the right lower extremity most consistent with cellulitis. Interval decrease in the size of the subcutaneous collection/hematoma in the medial proximal right calf compared to prior CT. 2. Diffuse subcutaneous edema of the left lower extremity. Interval resolution of the previously seen collections/hematoma and skin wound. No fluid collection. No soft tissue gas. 3. No acute osseous pathology. Electronically Signed   By: Elgie Collard M.D.   On: 08/08/2022 21:29   CT TIBIA FIBULA LEFT WO CONTRAST  Result Date: 08/08/2022 CLINICAL DATA:  Wound/sepsis. EXAM: CT OF THE LOWER BILATERAL EXTREMITY WITHOUT CONTRAST TECHNIQUE: Multidetector CT imaging of the lower bilateral extremity was performed according to the standard protocol. RADIATION DOSE REDUCTION: This exam was performed according to the departmental dose-optimization program which includes automated exposure control, adjustment of the mA and/or  kV according to patient size and/or use of iterative reconstruction technique. COMPARISON:  CT dated 06/22/2022. FINDINGS: Bones/Joint/Cartilage Right: There is no acute fracture or dislocation. The bones are osteopenic. There is moderate arthritic changes of the right knee. No joint effusion. Left: There is no acute fracture or dislocation. The bones are osteopenic. There is moderate arthritic changes of the left knee no joint effusion. Ligaments Suboptimally assessed by CT. Muscles and Tendons Right: No acute findings. No intramuscular fluid collection or hematoma. There is fatty atrophy. Left: No acute findings. No intramuscular hematoma or fluid collection. Fatty atrophy of the musculature. Soft tissues Right: There is diffuse skin thickening and subcutaneous edema of the right lower extremity most consistent with cellulitis. Clinical correlation is recommended. There is a intermediate attenuating collection in the subcutaneous soft tissues of the proximal medial calf  measuring 6.9 x 2.5 cm (previously 8.9 x 3.6 cm) in greatest axial dimensions and 6.7 cm in craniocaudal length most consistent with a hematoma. No soft tissue gas. Left: Diffuse subcutaneous edema of the left lower extremity. Interval resolution of the previously seen subcutaneous collection/hematoma. No fluid collection. No soft tissue gas. IMPRESSION: 1. Diffuse skin thickening and subcutaneous edema of the right lower extremity most consistent with cellulitis. Interval decrease in the size of the subcutaneous collection/hematoma in the medial proximal right calf compared to prior CT. 2. Diffuse subcutaneous edema of the left lower extremity. Interval resolution of the previously seen collections/hematoma and skin wound. No fluid collection. No soft tissue gas. 3. No acute osseous pathology. Electronically Signed   By: Elgie Collard M.D.   On: 08/08/2022 21:29   CT TIBIA FIBULA RIGHT WO CONTRAST  Result Date: 08/08/2022 CLINICAL DATA:  Wound/sepsis. EXAM: CT OF THE LOWER BILATERAL EXTREMITY WITHOUT CONTRAST TECHNIQUE: Multidetector CT imaging of the lower bilateral extremity was performed according to the standard protocol. RADIATION DOSE REDUCTION: This exam was performed according to the departmental dose-optimization program which includes automated exposure control, adjustment of the mA and/or kV according to patient size and/or use of iterative reconstruction technique. COMPARISON:  CT dated 06/22/2022. FINDINGS: Bones/Joint/Cartilage Right: There is no acute fracture or dislocation. The bones are osteopenic. There is moderate arthritic changes of the right knee. No joint effusion. Left: There is no acute fracture or dislocation. The bones are osteopenic. There is moderate arthritic changes of the left knee no joint effusion. Ligaments Suboptimally assessed by CT. Muscles and Tendons Right: No acute findings. No intramuscular fluid collection or hematoma. There is fatty atrophy. Left: No acute  findings. No intramuscular hematoma or fluid collection. Fatty atrophy of the musculature. Soft tissues Right: There is diffuse skin thickening and subcutaneous edema of the right lower extremity most consistent with cellulitis. Clinical correlation is recommended. There is a intermediate attenuating collection in the subcutaneous soft tissues of the proximal medial calf measuring 6.9 x 2.5 cm (previously 8.9 x 3.6 cm) in greatest axial dimensions and 6.7 cm in craniocaudal length most consistent with a hematoma. No soft tissue gas. Left: Diffuse subcutaneous edema of the left lower extremity. Interval resolution of the previously seen subcutaneous collection/hematoma. No fluid collection. No soft tissue gas. IMPRESSION: 1. Diffuse skin thickening and subcutaneous edema of the right lower extremity most consistent with cellulitis. Interval decrease in the size of the subcutaneous collection/hematoma in the medial proximal right calf compared to prior CT. 2. Diffuse subcutaneous edema of the left lower extremity. Interval resolution of the previously seen collections/hematoma and skin wound. No fluid collection. No soft tissue  gas. 3. No acute osseous pathology. Electronically Signed   By: Elgie Collard M.D.   On: 08/08/2022 21:29   US RENAL  Result Date: 08/08/2022 CLINICAL DATA:  [id number] AKI (acute kidney injury) (HCC) [id number] EXAM: RENAL / URINARY TRACT ULTRASOUND COMPLETE COMPARISON:  January 05, 2015 FINDINGS: Evaluation is limited by limited acoustic windows and inability to cooperate fully with exam. Right Kidney: Renal measurements: 10.0 x 5.1 x 4.9 cm = volume: 131 mL. Echogenicity within normal limits. No mass or hydronephrosis visualized. Left Kidney: Renal measurements: 10.5 x 5.9 x 6.1 cm = volume: 196 mL. Echogenicity within normal limits. No mass or hydronephrosis visualized. Bladder: Completely decompressed around a Foley catheter. Other: None. IMPRESSION: No hydronephrosis. Electronically  Signed   By: Meda Klinefelter M.D.   On: 08/08/2022 19:07     Assessment/Plan: ?EColi and klebsiella bacteremia and one of which is ESBL producing organism- pt is currently on meropenem  source foley related UTI Repeat BC Will need 10 days of IV  Foley removed today ? Hyperlactatemia from sepsis   B/l leg hematoma The left was evacuated in may 2024 Culture from the wound bed was MRSA and providencia and they have been adequately treated- no obvious infection now Though CT lower extremity shows [place] hematoma, there is no obvious infection and currently any surgical intervention may not be necessary, but observe closely   Leucocytosis - improved-could be from UTI but observe the leg hematomas closely No diarrhea     Anemia   AKI- improving   Afib rate well controlled on amiodarone   multiple bruises and ecchymosis skin - off DOAC  Discussed the management with the team

## 2022-08-10 NOTE — Assessment & Plan Note (Signed)
EF of 30 to 40% on prior echo.  Severely dilated both ventricles. Clinically appears euvolemic -Holding home torsemide and spironolactone due to softer blood pressure -BNP at 489

## 2022-08-10 NOTE — TOC CM/SW Note (Signed)
Patient is a long-term resident at [facility]. CSW went by room to discuss discharge plan with him but he was working with [employer] fund[employer]. Will try again later.  Charlynn Court, CSW [phone]

## 2022-08-10 NOTE — Assessment & Plan Note (Addendum)
Lactic acid peaked at above 9.  Now trending down, still elevated at 2.9.  Secondary to sepsis -Continue to monitor

## 2022-08-10 NOTE — Progress Notes (Signed)
Progress Note   Patient: Daniel Eaton. FYB:[id number] DOB: Aug 18, 1940 DOA: 08/08/2022     2 DOS: the patient was seen and examined on 08/10/2022   Brief hospital course: Yahel Fuston. is a 82 y.o. male with medical history significant for HFrEF(35-40%), hypertension, COPD, A-fib, on Eliquis, PAD, anemia and hypothyroidism presented from his facility with left leg pain and abnormal labs which include significant leukocytosis and AKI.  Patient was recently admitted at [facility] from 5/8 till 5/15 due to left lower extremity hematoma burst, he was seen by vascular surgery and discharged with wound VAC along with 10 days of antibiotics with Cipro and Zyvox.  Wound cultures at that time grew MRSA and procidentia.  His home Eliquis was held at that time with instructions to resume on 07/12/2022.  Patient was being followed up by vascular surgery as outpatient and recently wound VAC was removed.  ED course.  Patient was found to be hypotensive which responded initially to IV fluid resuscitation.  Labs pertinent for leukocytosis at 31.2, absolute neutrophil 28.7, hemoglobin 11, platelets 450, MCV 102, creatinine 1.84,(baseline appears to be around 1) albumin 1.9, lactic acid 2 >>9.0.  UA with mild hematuria and pyuria.  Chest x-ray with mild interstitial edema and pulmonary vascular congestion.  Renal ultrasound with no hydronephrosis.  CT abdomen and pelvis was negative for any acute abnormality. CT lower extremities with diffuse skin thickening and subcutaneous edema of right lower extremity most consistent with cellulitis.  Interval decrease in the size of subcutaneous collection/hematoma in the medial proximal right calf as compared to prior CT.  Also shows diffused edema of left lower extremity with interval resolution of previously seen collection/hematoma and skin wound.  No osseous abnormality.  Cultures were drawn and patient was admitted for concern of sepsis with septic shock.  Initially started on  cefepime and vancomycin  6/25: Afebrile with blood pressure on softer side at 97/52.  Lactic acid started improving at 3.8.  Procalcitonin at 11.89, blood cultures with E. coli and Klebsiella pneumonia, CTX-M gene detected, cefepime switched with meropenem.  Urine cultures ordered as add-on.  6/26: Afebrile, lactic acid at 2.9, potassium 3.4, slowly improving leukocytosis.  BNP at 489. Preliminary urine cultures with Klebsiella pneumonia, likely the source of bacteremia. Apparently Foley catheter was placed for convenience per significant other, we will remove Foley and give him a voiding trial   Assessment and Plan: * Severe sepsis with septic shock [facility]) Patient met severe sepsis criteria with septic shock with leukocytosis, AKI, and severe lactic acidosis, peaked at more than 9, still elevated at 2.9 Likely secondary to UTI, patient came with Foley catheter in place which was replaced in ED. Blood cultures with E. coli and Klebsiella pneumonia, ESBL Urine cultures also growing Klebsiella pneumonia Initially received cefepime and vancomycin which was later switched to meropenem.  Vancomycin was discontinued -ID is on board -Remove Foley catheter and give him a voiding trial -Continue meropenem -Follow-up final culture results  Lactic acidosis Lactic acid peaked at above 9.  Now trending down, still elevated at 2.9.  Secondary to sepsis -Continue to monitor  Acute renal failure superimposed on stage 3a chronic kidney disease (HCC) Improving, creatinine at 1.44, baseline around 1. -Monitor renal function -Avoid nephrotoxins  Atrial fibrillation (HCC) Currently rate controlled. -Holding home metoprolol due to softer blood pressure -Continue with amiodarone and Xarelto  Hypothyroid -Continue Synthroid  PAD (peripheral artery disease) (HCC) Being followed up by vascular surgery as outpatient. 1+ pedal pulses. -  Patient is not on any aspirin or statin at home. -On  Xarelto  Cardiomyopathy (HCC) EF of 30 to 40% on prior echo.  Severely dilated both ventricles. Clinically appears euvolemic -Holding home torsemide and spironolactone due to softer blood pressure -BNP at 489  Macrocytic anemia Being managed by hematology as outpatient.  Hemoglobin seems improving, at 11.  History of B12 deficiency -Continue with home B12 and iron supplement  Essential hypertension Blood pressure currently soft.  Not needing any pressors but holding home antihypertensives.      Subjective: Patient was lying down comfortably when seen today.  Significant other at bedside and according to her Foley catheter was placed for convenience.  He was having difficulty peeing in the urinal due to body habitus.  Physical Exam: Vitals:   08/09/22 2239 08/09/22 2301 08/10/22 0554 08/10/22 1215  BP: 128/70 (!) 99/58  101/78  Pulse: 99 97  (!) 109  Resp: 20 20  18   Temp: 98.2 F (36.8 C) 98.4 F (36.9 C)  98.7 F (37.1 C)  TempSrc:    Oral  SpO2: 99% 98%  99%  Weight:   112.6 kg   Height:       General.  Frail and obese elderly man, in no acute distress. Pulmonary.  Lungs clear bilaterally, normal respiratory effort. CV.  Regular rate and rhythm, no JVD, rub or murmur. Abdomen.  Soft, nontender, nondistended, BS positive. CNS.  Alert and oriented .  No focal neurologic deficit. Extremities.  Clean bandages around both lower extremities, 1+ pedal pulses Psychiatry.  Judgment and insight appears normal.   Data Reviewed: Prior data reviewed  Family Communication: Talked with significant other, Patricia at bedside  Disposition: Status is: Inpatient Remains inpatient appropriate because: Severity of illness  Planned Discharge Destination: Skilled nursing facility  DVT prophylaxis.  Xarelto Time spent: 50 minutes  This record has been created using Conservation officer, historic buildings. Errors have been sought and corrected,but may not always be located. Such creation  errors do not reflect on the standard of care.   Author: Arnetha Courser, MD 08/10/2022 5:14 PM  For on call review [email or web address].

## 2022-08-10 NOTE — Assessment & Plan Note (Signed)
Blood pressure currently soft.  Not needing any pressors but holding home antihypertensives.

## 2022-08-10 NOTE — Assessment & Plan Note (Signed)
Improving, creatinine at 1.44, baseline around 1. -Monitor renal function -Avoid nephrotoxins

## 2022-08-10 NOTE — [place] FL2 (Signed)
[place] MEDICAID FL2 LEVEL OF CARE FORM     IDENTIFICATION  Patient Name: Daniel Eaton. Birthdate: [birth year] Sex: male Admission Date (Current Location): 08/08/2022  [place] and [place] Number:  Chiropodist and Address:  [facility], [address] [place], [place] [address]      Provider Number: [id number]  Attending Physician Name and Address:  Arnetha Courser, MD  Relative Name and Phone Number:       Current Level of Care: Hospital Recommended Level of Care: Skilled Nursing Facility Prior Approval Number:    Date Approved/Denied:   PASRR Number: [id number]  Discharge Plan: SNF    Current Diagnoses: Patient Active Problem List   Diagnosis Date Noted   Hypotension 08/10/2022   Septic shock (HCC) 08/09/2022   PAD (peripheral artery disease) (HCC) 08/09/2022   Severe sepsis with septic shock (HCC) 08/08/2022   Vitamin B12 deficiency 07/07/2022   Leg hematoma, left, initial encounter 06/22/2022   Lactic acidosis 06/22/2022   Macrocytic anemia 06/06/2022   Cellulitis of left lower extremity 02/14/2021   COPD (chronic obstructive pulmonary disease) (HCC)    Stroke (HCC)    Acute renal failure superimposed on stage 3a chronic kidney disease (HCC)    Elevated troponin    Normocytic anemia    Generalized weakness    Acute renal failure superimposed on chronic kidney disease (HCC)    Myalgia due to statin 12/18/2019   Atrial fibrillation (HCC) 04/09/2019   Essential hypertension 04/09/2019   Venous stasis dermatitis of both lower extremities 04/09/2019   Lower limb ulcer, ankle, right, limited to breakdown of skin (HCC) 04/09/2019   Rhabdomyolysis 12/10/2018   Impairment of balance 10/02/2018   Physical deconditioning 10/02/2018   Sepsis (HCC) 07/28/2018   Primary osteoarthritis of right knee 09/22/2017   Healthcare maintenance 03/16/2017   Aortic atherosclerosis (HCC) 03/15/2016   Cough 03/20/2015   Bronchitis, chronic  obstructive w acute bronchitis (HCC) 03/20/2015   Umbilical hernia without obstruction and without gangrene 01/12/2015   Ventral hernia without obstruction or gangrene 01/02/2015   Morbid obesity with BMI of 45.0-49.9, adult (HCC) 11/23/2013   Achalasia 06/30/2013   Cardiomyopathy (HCC) 06/30/2013   OSA (obstructive sleep apnea) 06/30/2013   Hypothyroid 06/30/2013    Orientation RESPIRATION BLADDER Height & Weight     Self, Time, Situation, Place  Normal Incontinent, Indwelling catheter Weight: 248 lb 3.8 oz (112.6 kg) Height:  5\' 11"  (180.3 cm)  BEHAVIORAL SYMPTOMS/MOOD NEUROLOGICAL BOWEL NUTRITION STATUS   (None)  (None) Continent Diet (2 gram sodium)  AMBULATORY STATUS COMMUNICATION OF NEEDS Skin     Verbally Bruising, Other (Comment) (Blister, erythema/redness, weeping. Pressure injury on ischial tuberosity: No dressing listed. Non-pressure wound on left tibial: ABD, gauze daily. Wound on left thigh: ABD, gauze daily. Wound on left vertebral column: Gauze daily.)                       Personal Care Assistance Level of Assistance              Functional Limitations Info  Sight, Hearing, Speech Sight Info: Adequate Hearing Info: Adequate Speech Info: Adequate    SPECIAL CARE FACTORS FREQUENCY                       Contractures Contractures Info: Not present    Additional Factors Info  Code Status, Allergies Code Status Info: Full code Allergies Info: Diltiazem Hcl  Current Medications (08/10/2022):  This is the current hospital active medication list Current Facility-Administered Medications  Medication Dose Route Frequency Provider Last Rate Last Admin   amiodarone (PACERONE) tablet 200 mg  200 mg Oral q morning Irena Cords V, MD   200 mg at 08/10/22 1103   Chlorhexidine Gluconate Cloth 2 % PADS 6 each  6 each Topical Q0600 Arnetha Courser, MD   6 each at 08/10/22 0640   cyanocobalamin (VITAMIN B12) tablet 1,000 mcg  1,000 mcg Oral Daily  Orson Aloe, RPH   1,000 mcg at 08/10/22 1102   ferrous sulfate tablet 325 mg  325 mg Oral Daily Arnetha Courser, MD   325 mg at 08/10/22 1103   fluticasone (FLONASE) 50 MCG/ACT nasal spray 2 spray  2 spray Each Nare Daily Arnetha Courser, MD       fluticasone furoate-vilanterol (BREO ELLIPTA) 100-25 MCG/ACT 1 puff  1 puff Inhalation Daily Arnetha Courser, MD       And   umeclidinium bromide (INCRUSE ELLIPTA) 62.5 MCG/ACT 1 puff  1 puff Inhalation Daily Arnetha Courser, MD       Gerhardt's butt cream   Topical TID Arnetha Courser, MD       HYDROcodone-acetaminophen (NORCO/VICODIN) 5-325 MG per tablet 1 tablet  1 tablet Oral Q4H PRN Arnetha Courser, MD   1 tablet at 08/10/22 1103   levothyroxine (SYNTHROID) tablet 88 mcg  88 mcg Oral Daily Irena Cords V, MD   88 mcg at 08/10/22 0641   loratadine (CLARITIN) tablet 10 mg  10 mg Oral Daily Arnetha Courser, MD   10 mg at 08/10/22 1104   meropenem (MERREM) 1 g in sodium chloride 0.9 % 100 mL IVPB  1 g Intravenous Q8H Zeigler, Dustin G, RPH       multivitamin with minerals tablet 1 tablet  1 tablet Oral Q lunch Arnetha Courser, MD   1 tablet at 08/10/22 1103   pantoprazole (PROTONIX) EC tablet 40 mg  40 mg Oral Daily Arnetha Courser, MD   40 mg at 08/10/22 1104   polyvinyl alcohol (LIQUIFILM TEARS) 1.4 % ophthalmic solution 1 drop  1 drop Right Eye PRN Orson Aloe, RPH       rivaroxaban (XARELTO) tablet 20 mg  20 mg Oral Q supper Arnetha Courser, MD   20 mg at 08/09/22 1547   sertraline (ZOLOFT) tablet 50 mg  50 mg Oral QHS Irena Cords V, MD   50 mg at 08/09/22 2225   traZODone (DESYREL) tablet 25 mg  25 mg Oral Lind Covert, MD   25 mg at 08/09/22 2224     Discharge Medications: Please see discharge summary for a list of discharge medications.  Relevant Imaging Results:  Relevant Lab Results:   Additional Information SS#: [id number]  Margarito Liner, LCSW

## 2022-08-10 NOTE — Progress Notes (Signed)
Lactic acid 6.4 yesterday morning but no repeat was done. Will continue to monitor.

## 2022-08-10 NOTE — TOC Initial Note (Signed)
Transition of Care (TOC) - Initial/Assessment Note    Patient Details  Name: Daniel Eaton. MRN: [id number] Date of Birth: November 30, 1940  Transition of Care [facility]) CM/SW Contact:    Margarito Liner, LCSW Phone Number: 08/10/2022, 1:35 PM  Clinical Narrative:  CSW met with patient. Male family member at bedside. CSW introduced role and explained that discharge planning would be discussed. Patient confirmed he is from [facility] and plan is to return there at discharge. Admissions coordinator is aware that plan for IV vs PO abx is pending. No further concerns. CSW encouraged patient to contact CSW as needed. CSW will continue to follow patient's progress and facilitate return to SNF once medically stable.                Expected Discharge Plan: Skilled Nursing Facility Barriers to Discharge: Continued Medical Work up   Patient Goals and CMS Choice            Expected Discharge Plan and Services     Post Acute Care Choice: Resumption of Svcs/PTA Provider Living arrangements for the past 2 months: Skilled Nursing Facility                                      Prior Living Arrangements/Services Living arrangements for the past 2 months: Skilled Nursing Facility Lives with:: Facility Resident Patient language and need for interpreter reviewed:: Yes Do you feel safe going back to the place where you live?: Yes      Need for Family Participation in Patient Care: Yes (Comment) Care giver support system in place?: Yes (comment)   Criminal Activity/Legal Involvement Pertinent to Current Situation/Hospitalization: No - Comment as needed  Activities of Daily Living Home Assistive Devices/Equipment: Wheelchair, Hospital bed, [employer] Lift ADL Screening (condition at time of admission) Patient's cognitive ability adequate to safely complete daily activities?: Yes Is the patient deaf or have difficulty hearing?: No Does the patient have difficulty seeing, even when  wearing glasses/contacts?: No Does the patient have difficulty concentrating, remembering, or making decisions?: No Patient able to express need for assistance with ADLs?: Yes Does the patient have difficulty dressing or bathing?: Yes Independently performs ADLs?: No Communication: Independent Dressing (OT): Needs assistance Is this a change from baseline?: Pre-admission baseline Grooming: Needs assistance Is this a change from baseline?: Pre-admission baseline Feeding: Independent Bathing: Needs assistance Is this a change from baseline?: Pre-admission baseline Toileting: Needs assistance Is this a change from baseline?: Pre-admission baseline In/Out Bed: Needs assistance Is this a change from baseline?: Pre-admission baseline Walks in Home: Needs assistance Is this a change from baseline?: Pre-admission baseline Does the patient have difficulty walking or climbing stairs?: Yes Weakness of Legs: Both Weakness of Arms/Hands: Both  Permission Sought/Granted Permission sought to share information with : Facility Industrial/product designer granted to share information with : Yes, Verbal Permission Granted     Permission granted to share info w AGENCY: [facility]        Emotional Assessment Appearance:: Appears stated age Attitude/Demeanor/Rapport: Engaged, Gracious Affect (typically observed): Accepting, Appropriate, Calm, Pleasant Orientation: : Oriented to Self, Oriented to Place, Oriented to  Time, Oriented to Situation Alcohol / Substance Use: Not Applicable Psych Involvement: No (comment)  Admission diagnosis:  Severe sepsis with septic shock (HCC) [A41.9, R65.21] Septic shock (HCC) [A41.9, R65.21] Hypotension, unspecified hypotension type [I95.9] Leukocytosis, unspecified type [D72.829] Patient Active Problem  List   Diagnosis Date Noted   Hypotension 08/10/2022   Septic shock (HCC) 08/09/2022   PAD (peripheral artery disease) (HCC) 08/09/2022    Severe sepsis with septic shock (HCC) 08/08/2022   Vitamin B12 deficiency 07/07/2022   Leg hematoma, left, initial encounter 06/22/2022   Lactic acidosis 06/22/2022   Macrocytic anemia 06/06/2022   Cellulitis of left lower extremity 02/14/2021   COPD (chronic obstructive pulmonary disease) (HCC)    Stroke (HCC)    Acute renal failure superimposed on stage 3a chronic kidney disease (HCC)    Elevated troponin    Normocytic anemia    Generalized weakness    Acute renal failure superimposed on chronic kidney disease (HCC)    Myalgia due to statin 12/18/2019   Atrial fibrillation (HCC) 04/09/2019   Essential hypertension 04/09/2019   Venous stasis dermatitis of both lower extremities 04/09/2019   Lower limb ulcer, ankle, right, limited to breakdown of skin (HCC) 04/09/2019   Rhabdomyolysis 12/10/2018   Impairment of balance 10/02/2018   Physical deconditioning 10/02/2018   Sepsis (HCC) 07/28/2018   Primary osteoarthritis of right knee 09/22/2017   Healthcare maintenance 03/16/2017   Aortic atherosclerosis (HCC) 03/15/2016   Cough 03/20/2015   Bronchitis, chronic obstructive w acute bronchitis (HCC) 03/20/2015   Umbilical hernia without obstruction and without gangrene 01/12/2015   Ventral hernia without obstruction or gangrene 01/02/2015   Morbid obesity with BMI of 45.0-49.9, adult (HCC) 11/23/2013   Achalasia 06/30/2013   Cardiomyopathy (HCC) 06/30/2013   OSA (obstructive sleep apnea) 06/30/2013   Hypothyroid 06/30/2013   PCP:  Lauro Regulus, MD Pharmacy:   [facility] [phone] - [place], [place] - [address] [place] [address] Phone: [phone] Fax: [phone]  [facility] - [place], [place] - [address] [place] [address] Phone: [phone] Fax: [phone]     Social Determinants of Health (SDOH) Social History: SDOH Screenings   Food Insecurity: No Food Insecurity (08/09/2022)  Housing: Low  Risk  (08/09/2022)  Transportation Needs: No Transportation Needs (08/09/2022)  Utilities: Not At Risk (08/09/2022)  Depression (PHQ2-9): Low Risk  (06/06/2022)  Tobacco Use: Medium Risk (08/09/2022)   SDOH Interventions:     Readmission Risk Interventions     No data to display

## 2022-08-11 DIAGNOSIS — I129 Hypertensive chronic kidney disease with stage 1 through stage 4 chronic kidney disease, or unspecified chronic kidney disease: Secondary | ICD-10-CM

## 2022-08-11 DIAGNOSIS — R7881 Bacteremia: Secondary | ICD-10-CM | POA: Diagnosis not present

## 2022-08-11 DIAGNOSIS — N390 Urinary tract infection, site not specified: Secondary | ICD-10-CM

## 2022-08-11 DIAGNOSIS — D72829 Elevated white blood cell count, unspecified: Secondary | ICD-10-CM | POA: Diagnosis not present

## 2022-08-11 DIAGNOSIS — Z87891 Personal history of nicotine dependence: Secondary | ICD-10-CM

## 2022-08-11 DIAGNOSIS — B9620 Unspecified Escherichia coli [E. coli] as the cause of diseases classified elsewhere: Secondary | ICD-10-CM | POA: Diagnosis not present

## 2022-08-11 LAB — RENAL FUNCTION PANEL
Albumin: 1.5 g/dL — ABNORMAL LOW (ref 3.5–5.0)
Anion gap: 7 (ref 5–15)
BUN: 18 mg/dL (ref 8–23)
CO2: 23 mmol/L (ref 22–32)
Calcium: 7.3 mg/dL — ABNORMAL LOW (ref 8.9–10.3)
Chloride: 107 mmol/L (ref 98–111)
Creatinine, Ser: 1.29 mg/dL — ABNORMAL HIGH (ref 0.61–1.24)
GFR, Estimated: 55 mL/min — ABNORMAL LOW (ref >60)
Glucose, Bld: 71 mg/dL (ref 70–99)
Phosphorus: 2.7 mg/dL (ref 2.5–4.6)
Potassium: 3.4 mmol/L — ABNORMAL LOW (ref 3.5–5.1)
Sodium: 137 mmol/L (ref 135–145)

## 2022-08-11 LAB — GLUCOSE, CAPILLARY
Glucose-Capillary: 66 mg/dL — ABNORMAL LOW (ref 70–99)
Glucose-Capillary: 75 mg/dL (ref 70–99)
Glucose-Capillary: 104 mg/dL — ABNORMAL HIGH (ref 70–99)

## 2022-08-11 LAB — CBC
HCT: 30 % — ABNORMAL LOW (ref 39.0–52.0)
Hemoglobin: 9.1 g/dL — ABNORMAL LOW (ref 13.0–17.0)
MCH: 30.6 pg (ref 26.0–34.0)
MCHC: 30.3 g/dL (ref 30.0–36.0)
MCV: 101 fL — ABNORMAL HIGH (ref 80.0–100.0)
Platelets: 341 10*3/uL (ref 150–400)
RBC: 2.97 MIL/uL — ABNORMAL LOW (ref 4.22–5.81)
RDW: 20.6 % — ABNORMAL HIGH (ref 11.5–15.5)
WBC: 11.1 10*3/uL — ABNORMAL HIGH (ref 4.0–10.5)
nRBC: 0 % (ref 0.0–0.2)

## 2022-08-11 LAB — CULTURE, BLOOD (ROUTINE X 2): Special Requests: ADEQUATE

## 2022-08-11 LAB — URINE CULTURE: Culture: >=100000 — AB

## 2022-08-11 LAB — PROCALCITONIN: Procalcitonin: 4.73 ng/mL

## 2022-08-11 LAB — LACTIC ACID, PLASMA: Lactic Acid, Venous: 1.4 mmol/L (ref 0.5–1.9)

## 2022-08-11 MED ORDER — SODIUM CHLORIDE 0.9 % IV SOLN
1.0000 g | INTRAVENOUS | Status: DC
  Administered 2022-08-12 – 2022-08-17 (×6): 1000 mg via INTRAVENOUS
  Filled 2022-08-11 (×6): qty 1

## 2022-08-11 MED ORDER — POTASSIUM CHLORIDE 20 MEQ PO PACK
40.0000 meq | PACK | Freq: Two times a day (BID) | ORAL | Status: AC
  Administered 2022-08-11 (×2): 40 meq via ORAL
  Filled 2022-08-11 (×2): qty 2

## 2022-08-11 NOTE — TOC Progression Note (Signed)
Transition of Care (TOC) - Progression Note    Patient Details  Name: Daniel Eaton. MRN: [id number] Date of Birth: 1940/06/18  Transition of Care [facility]) CM/SW Contact  Margarito Liner, LCSW Phone Number: 08/11/2022, 8:35 AM  Clinical Narrative:   SNF admissions coordinator confirmed patient uses air mattress at facility.  Expected Discharge Plan: Skilled Nursing Facility Barriers to Discharge: Continued Medical Work up  Expected Discharge Plan and Services     Post Acute Care Choice: Resumption of Svcs/PTA Provider Living arrangements for the past 2 months: Skilled Nursing Facility                                       Social Determinants of Health (SDOH) Interventions SDOH Screenings   Food Insecurity: No Food Insecurity (08/09/2022)  Housing: Low Risk  (08/09/2022)  Transportation Needs: No Transportation Needs (08/09/2022)  Utilities: Not At Risk (08/09/2022)  Depression (PHQ2-9): Low Risk  (06/06/2022)  Tobacco Use: Medium Risk (08/09/2022)    Readmission Risk Interventions    08/10/2022    3:34 PM  Readmission Risk Prevention Plan  PCP or Specialist Appt within 3-5 Days Complete  Social Work Consult for Recovery Care Planning/Counseling Complete  Palliative Care Screening Not Applicable  Medication Review Oceanographer) Complete

## 2022-08-11 NOTE — Progress Notes (Signed)
Progress Note   Patient: Daniel Eaton. WUJ:[id number] DOB: 1940/05/05 DOA: 08/08/2022     3 DOS: the patient was seen and examined on 08/11/2022   Brief hospital course: Sebastian Lurz. is a 82 y.o. male with medical history significant for HFrEF(35-40%), hypertension, COPD, A-fib, on Eliquis, PAD, anemia and hypothyroidism presented from his facility with left leg pain and abnormal labs which include significant leukocytosis and AKI.  Patient was recently admitted at [facility] from 5/8 till 5/15 due to left lower extremity hematoma burst, he was seen by vascular surgery and discharged with wound VAC along with 10 days of antibiotics with Cipro and Zyvox.  Wound cultures at that time grew MRSA and procidentia.  His home Eliquis was held at that time with instructions to resume on 07/12/2022.  Patient was being followed up by vascular surgery as outpatient and recently wound VAC was removed.  ED course.  Patient was found to be hypotensive which responded initially to IV fluid resuscitation.  Labs pertinent for leukocytosis at 31.2, absolute neutrophil 28.7, hemoglobin 11, platelets 450, MCV 102, creatinine 1.84,(baseline appears to be around 1) albumin 1.9, lactic acid 2 >>9.0.  UA with mild hematuria and pyuria.  Chest x-ray with mild interstitial edema and pulmonary vascular congestion.  Renal ultrasound with no hydronephrosis.  CT abdomen and pelvis was negative for any acute abnormality. CT lower extremities with diffuse skin thickening and subcutaneous edema of right lower extremity most consistent with cellulitis.  Interval decrease in the size of subcutaneous collection/hematoma in the medial proximal right calf as compared to prior CT.  Also shows diffused edema of left lower extremity with interval resolution of previously seen collection/hematoma and skin wound.  No osseous abnormality.  Cultures were drawn and patient was admitted for concern of sepsis with septic shock.  Initially started on  cefepime and vancomycin  6/25: Afebrile with blood pressure on softer side at 97/52.  Lactic acid started improving at 3.8.  Procalcitonin at 11.89, blood cultures with E. coli and Klebsiella pneumonia, CTX-M gene detected, cefepime switched with meropenem.  Urine cultures ordered as add-on.  6/26: Afebrile, lactic acid at 2.9, potassium 3.4, slowly improving leukocytosis.  BNP at 489. Preliminary urine cultures with Klebsiella pneumonia, likely the source of bacteremia. Apparently Foley catheter was placed for convenience per significant other, we will remove Foley and give him a voiding trial  6/27: Remained afebrile, improving leukocytosis, procalcitonin and creatinine.  Lactic acidosis has been resolved.  Repeat blood cultures negative in 12 hours.  Voiding well after removal of Foley catheter.   Assessment and Plan: * Severe sepsis with septic shock [facility]) Patient met severe sepsis criteria with septic shock with leukocytosis, AKI, and severe lactic acidosis, peaked at more than 9, still elevated at 2.9 Likely secondary to UTI, patient came with Foley catheter in place which was replaced in ED. Blood cultures with E. coli and Klebsiella pneumonia, ESBL Urine cultures also growing Klebsiella pneumonia Initially received cefepime and vancomycin which was later switched to meropenem.  Vancomycin was discontinued -ID is on board -Foley was removed on 6/26-voiding well -Continue meropenem -Follow-up final culture results  Lactic acidosis Lactic acid peaked at above 9.  Now trending down, still elevated at 2.9.  Secondary to sepsis -Continue to monitor  Acute renal failure superimposed on stage 3a chronic kidney disease (HCC) Improving, creatinine at 1.44, baseline around 1. -Monitor renal function -Avoid nephrotoxins  Atrial fibrillation (HCC) Currently rate controlled. -Holding home metoprolol due to softer  blood pressure -Continue with amiodarone and  Xarelto  Hypothyroid -Continue Synthroid  PAD (peripheral artery disease) (HCC) Being followed up by vascular surgery as outpatient. 1+ pedal pulses. -Patient is not on any aspirin or statin at home. -On Xarelto  Cardiomyopathy (HCC) EF of 30 to 40% on prior echo.  Severely dilated both ventricles. Clinically appears euvolemic -Holding home torsemide and spironolactone due to softer blood pressure -BNP at 489  Macrocytic anemia Being managed by hematology as outpatient.  Hemoglobin seems improving, at 11.  History of B12 deficiency -Continue with home B12 and iron supplement  Essential hypertension Blood pressure currently soft.  Not needing any pressors but holding home antihypertensives.      Subjective: Patient continued to feel weak.  Voiding well after removal of Foley catheter.  Physical Exam: Vitals:   08/10/22 2022 08/11/22 0917 08/11/22 1135 08/11/22 1544  BP: (!) 117/56 119/83 116/72 123/64  Pulse: (!) 103 89 (!) 104 (!) 108  Resp: 20 16 16 16   Temp: 97.8 F (36.6 C)   98 F (36.7 C)  TempSrc:    Oral  SpO2: 98% 97% 98% 99%  Weight:      Height:       General.  Frail and obese elderly man, in no acute distress. Pulmonary.  Lungs clear bilaterally, normal respiratory effort. CV.  Regular rate and rhythm, no JVD, rub or murmur. Abdomen.  Soft, nontender, nondistended, BS positive. CNS.  Alert and oriented .  No focal neurologic deficit. Extremities.  No edema, no cyanosis, pulses intact and symmetrical.  Clean bandage on lower extremities Psychiatry.  Judgment and insight appears normal.   Data Reviewed: Prior data reviewed  Family Communication:   Disposition: Status is: Inpatient Remains inpatient appropriate because: Severity of illness  Planned Discharge Destination: Skilled nursing facility  DVT prophylaxis.  Xarelto Time spent: 45 minutes  This record has been created using Conservation officer, historic buildings. Errors have been sought and  corrected,but may not always be located. Such creation errors do not reflect on the standard of care.   Author: Arnetha Courser, MD 08/11/2022 4:19 PM  For on call review [email or web address].

## 2022-08-11 NOTE — Progress Notes (Signed)
Date of Admission:  08/08/2022     ID: Daniel Eaton. is a 82 y.o. male with   Principal Problem:   Severe sepsis with septic shock (HCC) Active Problems:   Atrial fibrillation (HCC)   Essential hypertension   Acute renal failure superimposed on stage 3a chronic kidney disease (HCC)   Cardiomyopathy (HCC)   Hypothyroid   Macrocytic anemia   Lactic acidosis   PAD (peripheral artery disease) (HCC)   Hypotension   Bacteremia due to Escherichia coli   Bacteremia due to Klebsiella pneumoniae   Infection due to extended spectrum beta lactamase (ESBL) producing bacteria    Subjective: Doing okay  Medications:   amiodarone  200 mg Oral q morning   Chlorhexidine Gluconate Cloth  6 each Topical Q0600   vitamin B-12  1,000 mcg Oral Daily   ferrous sulfate  325 mg Oral Daily   fluticasone  2 spray Each Nare Daily   fluticasone furoate-vilanterol  1 puff Inhalation Daily   And   umeclidinium bromide  1 puff Inhalation Daily   Gerhardt's butt cream   Topical TID   levothyroxine  88 mcg Oral Daily   loratadine  10 mg Oral Daily   multivitamin with minerals  1 tablet Oral Q lunch   pantoprazole  40 mg Oral Daily   potassium chloride  40 mEq Oral BID   rivaroxaban  20 mg Oral Q supper   sertraline  50 mg Oral QHS   traZODone  25 mg Oral QHS    Objective: Vital signs in last 24 hours: Patient Vitals for the past 24 hrs:  BP Temp Temp src Pulse Resp SpO2  08/11/22 1544 123/64 98 F (36.7 C) Oral (!) 108 16 99 %  08/11/22 1135 116/72 -- -- (!) 104 16 98 %  08/11/22 0917 119/83 -- -- 89 16 97 %  08/10/22 2022 (!) 117/56 97.8 F (36.6 C) -- (!) 103 20 98 %     LDA Foley was removed yesterday   PHYSICAL EXAM:  General: Alert, cooperative, no distress, appears stated age.  Lungs: Clear to auscultation bilaterally. No Wheezing or Rhonchi. No rales. Heart: Regular rate and rhythm, no murmur, rub or gallop. Abdomen: Soft, non-tender,not distended. Bowel sounds normal. No  masses Extremities: healing wounds legs Skin: multiple bruises , ecchymoses Wounds      Left leg    Left shin  Rt shin   Lymph: Cervical, supraclavicular normal. Neurologic: unable to assess in detail  Lab Results    Latest Ref Rng & Units 08/11/2022    5:39 AM 08/10/2022    4:32 AM 08/08/2022    2:15 PM  CBC  WBC 4.0 - 10.5 K/uL 11.1  15.3  31.2   Hemoglobin 13.0 - 17.0 g/dL 9.1  8.8  60.4   Hematocrit 39.0 - 52.0 % 30.0  28.4  36.1   Platelets 150 - 400 K/uL 341  347  450        Latest Ref Rng & Units 08/11/2022    5:39 AM 08/10/2022    4:32 AM 08/08/2022    9:50 PM  CMP  Glucose 70 - 99 mg/dL 71  95  64   BUN 8 - 23 mg/dL 18  22  19    Creatinine 0.61 - 1.24 mg/dL 5.40  9.81  1.91   Sodium 135 - 145 mmol/L 137  137  136   Potassium 3.5 - 5.1 mmol/L 3.4  3.4  4.6   Chloride 98 -  111 mmol/L 107  109  105   CO2 22 - 32 mmol/L 23  21  20    Calcium 8.9 - 10.3 mg/dL 7.3  7.4  7.8   Total Protein 6.5 - 8.1 g/dL   5.0   Total Bilirubin 0.3 - 1.2 mg/dL   0.8   Alkaline Phos 38 - 126 U/L   106   AST 15 - 41 U/L   25   ALT 0 - 44 U/L   11       Microbiology: 08/08/22 BC - Ecoli/klebsiella ESBL UC klebsiella  Studies/Results: No results found.   Assessment/Plan: ESBL E.colii and klebsiella bacteremia - pt is currently on meropenem. Change to ertapenem  source foley related UTI- klebsiella in urine culture Repeat BC sent Will need 10 -14 days of IV ? Hyperlactatemia from sepsis   B/l leg hematoma The left was evacuated in may 2024 Culture from the wound bed was MRSA and providencia and they have been adequately treated- no obvious infection now Though CT lower extremity shows [place] hematoma, there is no obvious infection and currently any surgical intervention may not be necessary, but observe closely   Leucocytosis - improved-c    Anemia   AKI- improving   Afib rate well controlled on amiodarone   multiple bruises and ecchymosis skin - off  DOAC  Discussed the management with the team

## 2022-08-12 ENCOUNTER — Ambulatory Visit (INDEPENDENT_AMBULATORY_CARE_PROVIDER_SITE_OTHER): Payer: PPO | Admitting: Nurse Practitioner

## 2022-08-12 DIAGNOSIS — N390 Urinary tract infection, site not specified: Secondary | ICD-10-CM | POA: Diagnosis not present

## 2022-08-12 DIAGNOSIS — B9620 Unspecified Escherichia coli [E. coli] as the cause of diseases classified elsewhere: Secondary | ICD-10-CM | POA: Diagnosis not present

## 2022-08-12 DIAGNOSIS — R7881 Bacteremia: Secondary | ICD-10-CM | POA: Diagnosis not present

## 2022-08-12 DIAGNOSIS — D72829 Elevated white blood cell count, unspecified: Secondary | ICD-10-CM | POA: Diagnosis not present

## 2022-08-12 DIAGNOSIS — A419 Sepsis, unspecified organism: Secondary | ICD-10-CM | POA: Diagnosis not present

## 2022-08-12 DIAGNOSIS — R6521 Severe sepsis with septic shock: Secondary | ICD-10-CM | POA: Diagnosis not present

## 2022-08-12 LAB — CULTURE, BLOOD (ROUTINE X 2)

## 2022-08-12 MED ORDER — MUPIROCIN 2 % EX OINT
1.0000 | TOPICAL_OINTMENT | Freq: Two times a day (BID) | CUTANEOUS | Status: AC
  Administered 2022-08-12 – 2022-08-16 (×10): 1 via NASAL
  Filled 2022-08-12 (×2): qty 22

## 2022-08-12 NOTE — Progress Notes (Signed)
Contacted Dr. Lucianne Muss regarding the order for stat CBG and for giving patient juice. Per MD, this is not needing to be done now, it is meant to be a standing order if the patient becomes hypoglycemic and the order is needed.

## 2022-08-12 NOTE — Progress Notes (Signed)
Date of Admission:  08/08/2022     ID: Daniel Bracket. is a 82 y.o. male with   Principal Problem:   Severe sepsis with septic shock (HCC) Active Problems:   Atrial fibrillation (HCC)   Essential hypertension   Acute renal failure superimposed on stage 3a chronic kidney disease (HCC)   Cardiomyopathy (HCC)   Hypothyroid   Macrocytic anemia   Lactic acidosis   PAD (peripheral artery disease) (HCC)   Hypotension   Bacteremia due to Escherichia coli   Bacteremia due to Klebsiella pneumoniae   Infection due to extended spectrum beta lactamase (ESBL) producing bacteria   Complicated UTI (urinary tract infection)    Subjective: Doing okay  Medications:   amiodarone  200 mg Oral q morning   Chlorhexidine Gluconate Cloth  6 each Topical Q0600   vitamin B-12  1,000 mcg Oral Daily   ferrous sulfate  325 mg Oral Daily   fluticasone  2 spray Each Nare Daily   fluticasone furoate-vilanterol  1 puff Inhalation Daily   And   umeclidinium bromide  1 puff Inhalation Daily   Gerhardt's butt cream   Topical TID   levothyroxine  88 mcg Oral Daily   loratadine  10 mg Oral Daily   multivitamin with minerals  1 tablet Oral Q lunch   mupirocin ointment  1 Application Nasal BID   pantoprazole  40 mg Oral Daily   rivaroxaban  20 mg Oral Q supper   sertraline  50 mg Oral QHS   traZODone  25 mg Oral QHS    Objective: Vital signs in last 24 hours: Patient Vitals for the past 24 hrs:  BP Temp Temp src Pulse Resp SpO2  08/12/22 1035 -- -- -- -- 19 --  08/12/22 0828 (!) 149/85 97.9 F (36.6 C) -- 98 16 97 %  08/12/22 0515 127/82 97.9 F (36.6 C) Oral (!) 105 18 97 %  08/12/22 0004 122/75 98.1 F (36.7 C) Oral 98 16 93 %  08/11/22 2014 128/76 97.8 F (36.6 C) Oral (!) 103 20 97 %  08/11/22 1544 123/64 98 F (36.7 C) Oral (!) 108 16 99 %  08/11/22 1135 116/72 -- -- (!) 104 16 98 %       PHYSICAL EXAM:  General: Alert, cooperative, no distress, appears stated age.  Lungs: Clear  to auscultation bilaterally. No Wheezing or Rhonchi. No rales. Heart: Regular rate and rhythm, no murmur, rub or gallop. Abdomen: Soft, non-tender,not distended. Bowel sounds normal. No masses Extremities: healing wounds legs Skin: multiple bruises , ecchymoses Wounds      Left leg    Left shin  Rt shin   Lymph: Cervical, supraclavicular normal. Neurologic: unable to assess in detail  Lab Results    Latest Ref Rng & Units 08/11/2022    5:39 AM 08/10/2022    4:32 AM 08/08/2022    2:15 PM  CBC  WBC 4.0 - 10.5 K/uL 11.1  15.3  31.2   Hemoglobin 13.0 - 17.0 g/dL 9.1  8.8  16.1   Hematocrit 39.0 - 52.0 % 30.0  28.4  36.1   Platelets 150 - 400 K/uL 341  347  450        Latest Ref Rng & Units 08/11/2022    5:39 AM 08/10/2022    4:32 AM 08/08/2022    9:50 PM  CMP  Glucose 70 - 99 mg/dL 71  95  64   BUN 8 - 23 mg/dL 18  22  19   Creatinine 0.61 - 1.24 mg/dL 1.61  0.96  0.45   Sodium 135 - 145 mmol/L 137  137  136   Potassium 3.5 - 5.1 mmol/L 3.4  3.4  4.6   Chloride 98 - 111 mmol/L 107  109  105   CO2 22 - 32 mmol/L 23  21  20    Calcium 8.9 - 10.3 mg/dL 7.3  7.4  7.8   Total Protein 6.5 - 8.1 g/dL   5.0   Total Bilirubin 0.3 - 1.2 mg/dL   0.8   Alkaline Phos 38 - 126 U/L   106   AST 15 - 41 U/L   25   ALT 0 - 44 U/L   11       Microbiology: 08/08/22 BC - Ecoli/klebsiella ESBL UC klebsiella  Studies/Results: No results found.   Assessment/Plan: ESBL E.colii and klebsiella bacteremia - pt is currently on meropenem. Change to ertapenem  source foley related UTI- klebsiella in urine culture Repeat BC neg so far IV ertapenem until 08/20/22 ?    B/l leg hematoma The left was evacuated in may 2024 Culture from the wound bed was MRSA and providencia and they have been adequately treated- no obvious infection now Though CT lower extremity shows [place] hematoma, there is no obvious infection  Leucocytosis - resolved    Anemia   AKI- improving   Afib rate well  controlled on amiodarone   multiple bruises and ecchymosis skin - off DOAC  Discussed the management with the team ID will sign off - call if needed

## 2022-08-12 NOTE — Evaluation (Signed)
Clinical/Bedside Swallow Evaluation Patient Details  Name: Daniel Eaton. MRN: [id number] Date of Birth: [birth year]  Today's Date: 08/12/2022 Time: SLP Start Time (ACUTE ONLY): 1305 SLP Stop Time (ACUTE ONLY): 1323 SLP Time Calculation (min) (ACUTE ONLY): 18 min  Past Medical History:  Past Medical History:  Diagnosis Date   A-fib (HCC)    Cellulitis    CHF (congestive heart failure) (HCC)    COPD (chronic obstructive pulmonary disease) (HCC)    Difficult intubation    Hyperlipidemia    Hypertension    Hypothyroidism    Kidney disease    Sleep apnea    Stroke [facility])    Thyroid disease    Past Surgical History:  Past Surgical History:  Procedure Laterality Date   APPLICATION OF WOUND VAC Left 06/24/2022   Procedure: APPLICATION OF WOUND VAC;  Surgeon: Renford Dills, MD;  Location: [facility] ORS;  Service: Vascular;  Laterality: Left;   APPLICATION OF WOUND VAC  06/28/2022   Procedure: APPLICATION OF WOUND VAC;  Surgeon: Renford Dills, MD;  Location: [facility] ORS;  Service: Vascular;;   CARDIAC CATHETERIZATION     CATARACT EXTRACTION W/ INTRAOCULAR LENS  IMPLANT, BILATERAL     COLONOSCOPY  2008   HEMATOMA EVACUATION Left 06/24/2022   Procedure: EVACUATION HEMATOMA;  Surgeon: Renford Dills, MD;  Location: [facility] ORS;  Service: Vascular;  Laterality: Left;  wound vac   HERNIA REPAIR     bilateral inguinal hernia/ [place]   HERNIA REPAIR  02/02/2015   18 x 28 cm ventral light mesh placed laparoscopically.   INCISION AND DRAINAGE Left 06/24/2022   Procedure: INCISION AND DRAINAGE;  Surgeon: Renford Dills, MD;  Location: [facility] ORS;  Service: Vascular;  Laterality: Left;   TONSILLECTOMY     UMBILICAL HERNIA REPAIR N/A 02/02/2015   Procedure: HERNIA REPAIR UMBILICAL ADULT;  Surgeon: Earline Mayotte, MD;  Location: [facility] ORS;  Service: General;  Laterality: N/A;   VENTRAL HERNIA REPAIR N/A 02/02/2015   Procedure: LAPAROSCOPIC VENTRAL HERNIA;  Surgeon: Earline Mayotte, MD;   Location: [facility] ORS;  Service: General;  Laterality: N/A;   vp shunt placement  1979   HPI:  Pt is an 82 year old male who presented to [facility] ED on 08/08/2022 from [facility] with septic shock.    Assessment / Plan / Recommendation  Clinical Impression  Consult made to ST services d/t pt inconsistently spitting out food particles. Per nursing report pt's ability and choice of particles to spit out is inconsistent. When consuming thin liquids via straw, pt was free of overt s/s of aspiration. When discussing a potential change to puree, pt refused and his NT offered that pt spit out small pieces of fruit contained within his yogurt. Pt further describes a routine that he has when consuming PO. After meeting with pt and his nurse, this writer called [facility] and was able to speak with pt's nurse there. She stated that pt was on a regular diet with thin liquids and he was able to feed himself. She further volunteered that pt "chews for the taste and then spits the food out." At this time, pt's current abilities appear consistent with baseline behaviors that this writer is unable to change as they appear self-directed and inconsistent. Education provided to pt's attending and nurse on pt's baseline ability. SLP Visit Diagnosis: Dysphagia, unspecified (R13.10)    Aspiration Risk  Risk for inadequate nutrition/hydration    Diet Recommendation Regular;Thin liquid  Liquid Administration via: Straw;Cup Medication Administration: Whole meds with liquid Supervision: Staff to assist with self feeding;Patient able to self feed Compensations: Minimize environmental distractions;Slow rate;Small sips/bites Postural Changes: Seated upright at 90 degrees;Remain upright for at least 30 minutes after po intake    Other  Recommendations Oral Care Recommendations: Oral care BID    Recommendations for follow up therapy are one component of a multi-disciplinary discharge planning process, led by the  attending physician.  Recommendations may be updated based on patient status, additional functional criteria and insurance authorization.  Follow up Recommendations No SLP follow up      Assistance Recommended at Discharge  N/A  Functional Status Assessment Patient has not had a recent decline in their functional status  Frequency and Duration   N/A         Prognosis Prognosis for improved oropharyngeal function: Guarded Barriers to Reach Goals: Cognitive deficits      Swallow Study   General Date of Onset: 08/08/22 HPI: Pt is an 82 year old male who presented to [facility] ED on 08/08/2022 from [facility] with septic shock. Type of Study: Bedside Swallow Evaluation Previous Swallow Assessment: none in chart Diet Prior to this Study: Regular;Thin liquids (Level 0) Temperature Spikes Noted: No Respiratory Status: Room air History of Recent Intubation: No Behavior/Cognition: Alert Oral Cavity Assessment: Within Functional Limits Oral Care Completed by SLP: No Vision: Functional for self-feeding Self-Feeding Abilities: Needs assist Patient Positioning: Upright in bed Baseline Vocal Quality: Normal Volitional Cough: Cognitively unable to elicit Volitional Swallow: Unable to elicit    Oral/Motor/Sensory Function Overall Oral Motor/Sensory Function: Within functional limits   Ice Chips Ice chips: Not tested   Thin Liquid Thin Liquid: Within functional limits Presentation: Self Fed;Straw    Nectar Thick Nectar Thick Liquid: Not tested   Honey Thick Honey Thick Liquid: Not tested   Puree Puree: Within functional limits Other Comments: pt spits out any particles within puree   Solid     Solid: Impaired Other Comments: pt spits out inconsistent particles of food     Raivyn Kabler B. Dreama Saa, M.S., CCC-SLP, Tree surgeon Certified Brain Injury Specialist [facility]  [facility] Office [phone] Ascom  [phone] Fax [phone]

## 2022-08-12 NOTE — Treatment Plan (Signed)
Diagnosis: ESBL ecoli bacteremia Klebsiella bacteremia Due to foley catheter related infection Baseline Creatinine 1.29   Allergies  Allergen Reactions   Diltiazem Hcl Itching and Other (See Comments)    edema    OPAT Orders Discharge antibiotics: Ertapenem 1 gram Iv every 24 hours For 10 days End Date: 08/20/22  Midline Care Per Protocol:  Labs weekly while on IV antibiotics: _X_ CBC with differential  _X_ CMP   _X_ Please pull PIC at completion of IV antibiotics   Fax weekly lab results  promptly to [phone]  Clinic Follow Up Appt:with Dr.Ceferino Lang is not required Follow PRN   Call [phone] with nay concerns

## 2022-08-12 NOTE — Care Management Important Message (Signed)
Important Message  Patient Details  Name: Daniel Eaton. MRN: [id number] Date of Birth: 1940-08-04   Medicare Important Message Given:  Yes  Reviewed Medicare IM via room phone [phone]).  Copy of Medicare IM declined at this time.   Johnell Comings 08/12/2022, 10:47 AM

## 2022-08-12 NOTE — Progress Notes (Signed)
[facility] Progress Note  Patient: Daniel Eaton.    UEA:[id number]  DOA: 08/08/2022     Date of Service: the patient was seen and examined on 08/12/2022  Chief Complaint  Patient presents with   Abnormal Lab   Brief hospital course: ra L Akiles Didonna. is a 82 y.o. male with medical history significant for HFrEF(35-40%), hypertension, COPD, A-fib, on Eliquis, PAD, anemia and hypothyroidism presented from his facility with left leg pain and abnormal labs which include significant leukocytosis and AKI.   Patient was recently admitted at [facility] from 5/8 till 5/15 due to left lower extremity hematoma burst, he was seen by vascular surgery and discharged with wound VAC along with 10 days of antibiotics with Cipro and Zyvox.  Wound cultures at that time grew MRSA and procidentia.  His home Eliquis was held at that time with instructions to resume on 07/12/2022.  Patient was being followed up by vascular surgery as outpatient and recently wound VAC was removed.   ED course.  Patient was found to be hypotensive which responded initially to IV fluid resuscitation.  Labs pertinent for leukocytosis at 31.2, absolute neutrophil 28.7, hemoglobin 11, platelets 450, MCV 102, creatinine 1.84,(baseline appears to be around 1) albumin 1.9, lactic acid 2 >>9.0.  UA with mild hematuria and pyuria.  Chest x-ray with mild interstitial edema and pulmonary vascular congestion.  Renal ultrasound with no hydronephrosis.  CT abdomen and pelvis was negative for any acute abnormality. CT lower extremities with diffuse skin thickening and subcutaneous edema of right lower extremity most consistent with cellulitis.  Interval decrease in the size of subcutaneous collection/hematoma in the medial proximal right calf as compared to prior CT.  Also shows diffused edema of left lower extremity with interval resolution of previously seen collection/hematoma and skin wound.  No osseous abnormality.   Cultures were drawn and patient  was admitted for concern of sepsis with septic shock.  Initially started on cefepime and vancomycin   Assessment and Plan: Severe sepsis with septic shock [facility]) Patient met severe sepsis criteria with septic shock with leukocytosis, AKI, and severe lactic acidosis, peaked at more than 9, still elevated at 2.9 Likely secondary to UTI, patient came with Foley catheter in place which was replaced in ED. Blood cultures with E. coli and Klebsiella pneumonia, ESBL Urine cultures also growing Klebsiella pneumonia Initially received cefepime and vancomycin which was later switched to meropenem.  Vancomycin was discontinued -ID is on board -Foley was removed on 6/26-voiding well -Continue meropenem 6/27 blood culture NGTD    Lactic acidosis Lactic acid peaked at above 9 Secondary to sepsis.  Now trending down, LA 1.4  -Continue to monitor   Acute renal failure superimposed on stage 3a chronic kidney disease (HCC) Improving, creatinine at 1.44, baseline around 1. -Monitor renal function -Avoid nephrotoxins   Atrial fibrillation (HCC) Currently rate controlled. -Holding home metoprolol due to softer blood pressure -Continue with amiodarone and Xarelto   Hypothyroid -Continue Synthroid   PAD (peripheral artery disease) (HCC) Being followed up by vascular surgery as outpatient. 1+ pedal pulses. -Patient is not on any aspirin or statin at home. -On Xarelto   Cardiomyopathy  EF of 30 to 40% on prior echo.  Severely dilated both ventricles. Clinically appears euvolemic -Holding home torsemide and spironolactone due to softer blood pressure -BNP at 489   Macrocytic anemia Being managed by hematology as outpatient.  Hemoglobin seems improving, at 11.  History of B12 deficiency -Continue with home B12 and iron  supplement   Essential hypertension Blood pressure currently soft.  Not needing any pressors but holding home antihypertensives.   Body mass index is 34.62 kg/m.   Interventions:  Pressure Injury 06/25/22 Thigh Left;Posterior;Proximal;Right Stage 2 -  Partial thickness loss of dermis presenting as a shallow open injury with a red, pink wound bed without slough. skin tear (Active)  06/25/22 2230  Location: Thigh  Location Orientation: Left;Posterior;Proximal;Right  Staging: Stage 2 -  Partial thickness loss of dermis presenting as a shallow open injury with a red, pink wound bed without slough.  Wound Description (Comments): skin tear  Present on Admission:      Pressure Injury 08/08/22 Ischial tuberosity (Active)  08/08/22 2045  Location: Ischial tuberosity  Location Orientation:   Staging:   Wound Description (Comments):   Present on Admission:      Diet: Regular diet DVT Prophylaxis: Therapeutic Anticoagulation with Xarelto    Advance goals of care discussion: Full code  Family Communication: family was present at bedside, at the time of interview.  The pt provided permission to discuss medical plan with the family. Opportunity was given to ask question and all questions were answered satisfactorily.   Disposition:  Pt is from SNF, admitted with sepsis, still patient is hypothermic and on IV antibiotics, which precludes a safe discharge. Discharge to SNF, when clinically stable, patient may need 1-2 more days to improve. Most likely discharge on Monday   Subjective: No significant overnight events, patient was feeling cold and stated that close the door quickly, patient was using multiple blankets.  Patient stated that he is still having difficulty swallowing unable to eat by himself he needs help.  Denies any other specific complaints.  No chest pain or palpitation, no shortness of breath.  No abdominal pain.  Physical Exam: General: NAD, lying comfortably Appear in no distress, affect appropriate Eyes: PERRLA ENT: Oral Mucosa Clear, moist  Neck: no JVD,  Cardiovascular: S1 and S2 Present, no Murmur,  Respiratory: good respiratory  effort, Bilateral Air entry equal and Decreased, no Crackles, no wheezes Abdomen: Bowel Sound present, Soft and no tenderness,  Skin: no rashes Extremities: No Pedal edema, no calf tenderness. S/p rupture of hematoma, dressing CDI Neurologic: without any new focal findings Gait not checked due to patient safety concerns  Vitals:   08/12/22 0515 08/12/22 0828 08/12/22 1035 08/12/22 1313  BP: 127/82 (!) 149/85  125/82  Pulse: (!) 105 98  (!) 106  Resp: 18 16 19 16   Temp: 97.9 F (36.6 C) 97.9 F (36.6 C)  97.6 F (36.4 C)  TempSrc: Oral     SpO2: 97% 97%  97%  Weight:      Height:        Intake/Output Summary (Last 24 hours) at 08/12/2022 1406 Last data filed at 08/12/2022 0645 Gross per 24 hour  Intake 400 ml  Output 600 ml  Net -200 ml   Filed Weights   08/08/22 1440 08/10/22 0554  Weight: 108.9 kg 112.6 kg    Data Reviewed: I have personally reviewed and interpreted daily labs, tele strips, imagings as discussed above. I reviewed all nursing notes, pharmacy notes, vitals, pertinent old records I have discussed plan of care as described above with RN and patient/family.  CBC: Recent Labs  Lab 08/08/22 1415 08/10/22 0432 08/11/22 0539  WBC 31.2* 15.3* 11.1*  NEUTROABS 28.7*  --   --   HGB 11.0* 8.8* 9.1*  HCT 36.1* 28.4* 30.0*  MCV 102.0* 99.0 101.0*  PLT 450*  347 341   Basic Metabolic Panel: Recent Labs  Lab 08/08/22 1415 08/08/22 2150 08/10/22 0432 08/11/22 0539  NA 137 136 137 137  K 4.3 4.6 3.4* 3.4*  CL 106 105 109 107  CO2 22 20* 21* 23  GLUCOSE 106* 64* 95 71  BUN 19 19 22 18   CREATININE 1.84* 1.70* 1.44* 1.29*  CALCIUM 8.2* 7.8* 7.4* 7.3*  MG  --  1.9 2.0  --   PHOS  --   --   --  2.7    Studies: No results found.  Scheduled Meds:  amiodarone  200 mg Oral q morning   Chlorhexidine Gluconate Cloth  6 each Topical Q0600   vitamin B-12  1,000 mcg Oral Daily   ferrous sulfate  325 mg Oral Daily   fluticasone  2 spray Each Nare Daily    fluticasone furoate-vilanterol  1 puff Inhalation Daily   And   umeclidinium bromide  1 puff Inhalation Daily   Gerhardt's butt cream   Topical TID   levothyroxine  88 mcg Oral Daily   loratadine  10 mg Oral Daily   multivitamin with minerals  1 tablet Oral Q lunch   mupirocin ointment  1 Application Nasal BID   pantoprazole  40 mg Oral Daily   rivaroxaban  20 mg Oral Q supper   sertraline  50 mg Oral QHS   traZODone  25 mg Oral QHS   Continuous Infusions:  ertapenem Stopped (08/12/22 0603)   PRN Meds: HYDROcodone-acetaminophen, polyvinyl alcohol  Time spent: 35 minutes  Author: Gillis Santa. MD [facility] 08/12/2022 2:06 PM  To reach On-call, see care teams to locate the attending and reach out to them via [email or web address]. If 7PM-7AM, please contact night-coverage If you still have difficulty reaching the attending provider, please page the [facility] (Director on Call) for [facility] on amion for assistance.

## 2022-08-13 DIAGNOSIS — R6521 Severe sepsis with septic shock: Secondary | ICD-10-CM | POA: Diagnosis not present

## 2022-08-13 DIAGNOSIS — A419 Sepsis, unspecified organism: Secondary | ICD-10-CM | POA: Diagnosis not present

## 2022-08-13 LAB — CORTISOL-PM, BLOOD: Cortisol - PM: 13.1 ug/dL — ABNORMAL HIGH (ref <10.0)

## 2022-08-13 LAB — CBC
HCT: 32.5 % — ABNORMAL LOW (ref 39.0–52.0)
Hemoglobin: 9.9 g/dL — ABNORMAL LOW (ref 13.0–17.0)
MCH: 30.2 pg (ref 26.0–34.0)
MCHC: 30.5 g/dL (ref 30.0–36.0)
MCV: 99.1 fL (ref 80.0–100.0)
Platelets: 331 10*3/uL (ref 150–400)
RBC: 3.28 MIL/uL — ABNORMAL LOW (ref 4.22–5.81)
RDW: 20.2 % — ABNORMAL HIGH (ref 11.5–15.5)
WBC: 10.6 10*3/uL — ABNORMAL HIGH (ref 4.0–10.5)
nRBC: 0 % (ref 0.0–0.2)

## 2022-08-13 LAB — BASIC METABOLIC PANEL
Anion gap: 7 (ref 5–15)
BUN: 14 mg/dL (ref 8–23)
CO2: 21 mmol/L — ABNORMAL LOW (ref 22–32)
Calcium: 7.4 mg/dL — ABNORMAL LOW (ref 8.9–10.3)
Chloride: 109 mmol/L (ref 98–111)
Creatinine, Ser: 0.94 mg/dL (ref 0.61–1.24)
GFR, Estimated: >60 mL/min (ref >60)
Glucose, Bld: 67 mg/dL — ABNORMAL LOW (ref 70–99)
Potassium: 4.1 mmol/L (ref 3.5–5.1)
Sodium: 137 mmol/L (ref 135–145)

## 2022-08-13 LAB — CORTISOL: Cortisol, Plasma: 17.1 ug/dL

## 2022-08-13 LAB — PHOSPHORUS: Phosphorus: 1.5 mg/dL — ABNORMAL LOW (ref 2.5–4.6)

## 2022-08-13 LAB — MAGNESIUM: Magnesium: 1.9 mg/dL (ref 1.7–2.4)

## 2022-08-13 LAB — CULTURE, BLOOD (ROUTINE X 2)

## 2022-08-13 LAB — GLUCOSE, CAPILLARY: Glucose-Capillary: 79 mg/dL (ref 70–99)

## 2022-08-13 LAB — VITAMIN D 25 HYDROXY (VIT D DEFICIENCY, FRACTURES): Vit D, 25-Hydroxy: 45.97 ng/mL (ref 30–100)

## 2022-08-13 MED ORDER — POTASSIUM & SODIUM PHOSPHATES 280-160-250 MG PO PACK
1.0000 | PACK | Freq: Three times a day (TID) | ORAL | Status: DC
  Filled 2022-08-13 (×2): qty 1

## 2022-08-13 MED ORDER — ALBUTEROL SULFATE (2.5 MG/3ML) 0.083% IN NEBU
2.5000 mg | INHALATION_SOLUTION | Freq: Four times a day (QID) | RESPIRATORY_TRACT | Status: DC | PRN
  Administered 2022-08-13: 2.5 mg via RESPIRATORY_TRACT
  Filled 2022-08-13: qty 3

## 2022-08-13 MED ORDER — K PHOS MONO-SOD PHOS DI & MONO 155-852-130 MG PO TABS
500.0000 mg | ORAL_TABLET | Freq: Three times a day (TID) | ORAL | Status: AC
  Administered 2022-08-13 – 2022-08-14 (×6): 500 mg via ORAL
  Filled 2022-08-13 (×6): qty 2

## 2022-08-13 NOTE — Plan of Care (Signed)

## 2022-08-13 NOTE — Progress Notes (Signed)
[facility] Progress Note  Patient: Daniel Eaton.    ZOX:[id number]  DOA: 08/08/2022     Date of Service: the patient was seen and examined on 08/13/2022  Chief Complaint  Patient presents with   Abnormal Lab   Brief hospital course: Daniel Eaton. is a 82 y.o. male with medical history significant for HFrEF(35-40%), hypertension, COPD, A-fib, on Eliquis, PAD, anemia and hypothyroidism presented from his facility with left leg pain and abnormal labs which include significant leukocytosis and AKI.   Patient was recently admitted at [facility] from 5/8 till 5/15 due to left lower extremity hematoma burst, he was seen by vascular surgery and discharged with wound VAC along with 10 days of antibiotics with Cipro and Zyvox.  Wound cultures at that time grew MRSA and procidentia.  His home Eliquis was held at that time with instructions to resume on 07/12/2022.  Patient was being followed up by vascular surgery as outpatient and recently wound VAC was removed.   ED course.  Patient was found to be hypotensive which responded initially to IV fluid resuscitation.  Labs pertinent for leukocytosis at 31.2, absolute neutrophil 28.7, hemoglobin 11, platelets 450, MCV 102, creatinine 1.84,(baseline appears to be around 1) albumin 1.9, lactic acid 2 >>9.0.  UA with mild hematuria and pyuria.  Chest x-ray with mild interstitial edema and pulmonary vascular congestion.  Renal ultrasound with no hydronephrosis.  CT abdomen and pelvis was negative for any acute abnormality. CT lower extremities with diffuse skin thickening and subcutaneous edema of right lower extremity most consistent with cellulitis.  Interval decrease in the size of subcutaneous collection/hematoma in the medial proximal right calf as compared to prior CT.  Also shows diffused edema of left lower extremity with interval resolution of previously seen collection/hematoma and skin wound.  No osseous abnormality.   Cultures were drawn and patient  was admitted for concern of sepsis with septic shock.  Initially started on cefepime and vancomycin   Assessment and Plan: Severe sepsis with septic shock [facility]) Patient met severe sepsis criteria with septic shock with leukocytosis, AKI, and severe lactic acidosis, peaked at more than 9, still elevated at 2.9 Likely secondary to UTI, patient came with Foley catheter in place which was replaced in ED. Blood cultures with E. coli and Klebsiella pneumonia, ESBL Urine cultures also growing Klebsiella pneumonia Initially received cefepime and vancomycin which was later switched to meropenem.  Vancomycin was discontinued. D/c'd meropenem and started on ertapenem 1 g IV daily on 6/28 as per ID. 6/27 blood culture NGTD. Foley was removed on 6/26-voiding well Continue ertapenem 1 g IV daily for 10 days End date 08/20/2022 Please pull PIC at completion of IV antibiotics. Weekly labs CBC with differential and CMP    Hypoglycemia early morning and hypothermia Unknown cause RN was advised to use hypoglycemia protocol and check CBG Repeat cortisol level and check insulin level   Hypophosphatemia secondary to poor nutrition Phosphorus repleted.  Monitor electrolytes.   Lactic acidosis Lactic acid peaked at above 9 Secondary to sepsis.  Now trending down, LA 1.4  -Continue to monitor   Acute renal failure superimposed on stage 3a chronic kidney disease (HCC) Improving, creatinine at 1.44, baseline around 1. -Monitor renal function -Avoid nephrotoxins   Atrial fibrillation (HCC) Currently rate controlled. -Holding home metoprolol due to softer blood pressure -Continue with amiodarone and Xarelto   Hypothyroid -Continue Synthroid   PAD (peripheral artery disease) (HCC) Being followed up by vascular surgery as outpatient. 1+  pedal pulses. -Patient is not on any aspirin or statin at home. -On Xarelto   Cardiomyopathy  EF of 30 to 40% on prior echo.  Severely dilated both  ventricles. Clinically appears euvolemic -Holding home torsemide and spironolactone due to softer blood pressure -BNP at 489   Macrocytic anemia Being managed by hematology as outpatient.  Hemoglobin seems improving, at 11.  History of B12 deficiency -Continue with home B12 and iron supplement   Essential hypertension Blood pressure currently soft.  Not needing any pressors but holding home antihypertensives.   Body mass index is 34.62 kg/m.  Interventions:  Pressure Injury 06/25/22 Thigh Left;Posterior;Proximal;Right Stage 2 -  Partial thickness loss of dermis presenting as a shallow open injury with a red, pink wound bed without slough. skin tear (Active)  06/25/22 2230  Location: Thigh  Location Orientation: Left;Posterior;Proximal;Right  Staging: Stage 2 -  Partial thickness loss of dermis presenting as a shallow open injury with a red, pink wound bed without slough.  Wound Description (Comments): skin tear  Present on Admission:      Pressure Injury 08/08/22 Ischial tuberosity (Active)  08/08/22 2045  Location: Ischial tuberosity  Location Orientation:   Staging:   Wound Description (Comments):   Present on Admission:      Diet: Regular diet DVT Prophylaxis: Therapeutic Anticoagulation with Xarelto    Advance goals of care discussion: Full code  Family Communication: family was present at bedside, at the time of interview.  The pt provided permission to discuss medical plan with the family. Opportunity was given to ask question and all questions were answered satisfactorily.   Disposition:  Pt is from SNF, admitted with sepsis, still patient is hypothermic and on IV antibiotics, which precludes a safe discharge. Discharge to SNF, when clinically stable, patient may need 1-2 more days to improve. Most likely discharge on Monday   Subjective: No significant overnight events, patient had hypoglycemia early morning and juice was given, he felt improvement.  He was  little bit drowsy on the morning.  Still feels cold and using several blankets.  Mild cough with phlegm production especially during sleeping.  Patient still feels weak, tired and fatigued.  Denies any specific complaints.   Physical Exam: General: NAD, lying comfortably Appear in no distress, affect appropriate Eyes: PERRLA ENT: Oral Mucosa Clear, moist  Neck: no JVD,  Cardiovascular: S1 and S2 Present, no Murmur,  Respiratory: good respiratory effort, Bilateral Air entry equal and Decreased, no Crackles, mild wheezing audible without cystoscope most likely coming from throat, no wheezing on auscultation. Abdomen: Bowel Sound present, Soft and no tenderness,  Skin: no rashes Extremities: No Pedal edema, no calf tenderness. S/p rupture of hematoma, dressing CDI Neurologic: without any new focal findings Gait not checked due to patient safety concerns  Vitals:   08/12/22 2314 08/13/22 0445 08/13/22 0809 08/13/22 1125  BP: 135/75 (!) 142/78 (!) 123/91 117/63  Pulse: 100 (!) 104 (!) 102 (!) 110  Resp: 16 18 16 16   Temp: 98.4 F (36.9 C) 97.9 F (36.6 C) 97.7 F (36.5 C) 97.8 F (36.6 C)  TempSrc: Oral Oral    SpO2: 95% 95% 96% 92%  Weight:      Height:        Intake/Output Summary (Last 24 hours) at 08/13/2022 1208 Last data filed at 08/13/2022 1610 Gross per 24 hour  Intake 301.89 ml  Output 450 ml  Net -148.11 ml   Filed Weights   08/08/22 1440 08/10/22 0554  Weight: 108.9 kg  112.6 kg    Data Reviewed: I have personally reviewed and interpreted daily labs, tele strips, imagings as discussed above. I reviewed all nursing notes, pharmacy notes, vitals, pertinent old records I have discussed plan of care as described above with RN and patient/family.  CBC: Recent Labs  Lab 08/08/22 1415 08/10/22 0432 08/11/22 0539 08/13/22 0432  WBC 31.2* 15.3* 11.1* 10.6*  NEUTROABS 28.7*  --   --   --   HGB 11.0* 8.8* 9.1* 9.9*  HCT 36.1* 28.4* 30.0* 32.5*  MCV 102.0* 99.0  101.0* 99.1  PLT 450* 347 341 331   Basic Metabolic Panel: Recent Labs  Lab 08/08/22 1415 08/08/22 2150 08/10/22 0432 08/11/22 0539 08/13/22 0432  NA 137 136 137 137 137  K 4.3 4.6 3.4* 3.4* 4.1  CL 106 105 109 107 109  CO2 22 20* 21* 23 21*  GLUCOSE 106* 64* 95 71 67*  BUN 19 19 22 18 14   CREATININE 1.84* 1.70* 1.44* 1.29* 0.94  CALCIUM 8.2* 7.8* 7.4* 7.3* 7.4*  MG  --  1.9 2.0  --  1.9  PHOS  --   --   --  2.7 1.5*    Studies: No results found.  Scheduled Meds:  amiodarone  200 mg Oral q morning   Chlorhexidine Gluconate Cloth  6 each Topical Q0600   vitamin B-12  1,000 mcg Oral Daily   ferrous sulfate  325 mg Oral Daily   fluticasone  2 spray Each Nare Daily   fluticasone furoate-vilanterol  1 puff Inhalation Daily   And   umeclidinium bromide  1 puff Inhalation Daily   Gerhardt's butt cream   Topical TID   levothyroxine  88 mcg Oral Daily   loratadine  10 mg Oral Daily   multivitamin with minerals  1 tablet Oral Q lunch   mupirocin ointment  1 Application Nasal BID   pantoprazole  40 mg Oral Daily   phosphorus  500 mg Oral TID   rivaroxaban  20 mg Oral Q supper   sertraline  50 mg Oral QHS   traZODone  25 mg Oral QHS   Continuous Infusions:  ertapenem 1,000 mg (08/13/22 0618)   PRN Meds: HYDROcodone-acetaminophen, polyvinyl alcohol  Time spent: 35 minutes  Author: Gillis Santa. MD [facility] 08/13/2022 12:08 PM  To reach On-call, see care teams to locate the attending and reach out to them via [email or web address]. If 7PM-7AM, please contact night-coverage If you still have difficulty reaching the attending provider, please page the [facility] (Director on Call) for [facility] on amion for assistance.

## 2022-08-13 NOTE — Plan of Care (Signed)
  Problem: Fluid Volume: Goal: Hemodynamic stability will improve Outcome: Progressing   Problem: Clinical Measurements: Goal: Diagnostic test results will improve Outcome: Progressing Goal: Signs and symptoms of infection will decrease Outcome: Progressing   Problem: Respiratory: Goal: Ability to maintain adequate ventilation will improve Outcome: Progressing   Problem: Education: Goal: Knowledge of General Education information will improve Description: Including pain rating scale, medication(s)/side effects and non-pharmacologic comfort measures Outcome: Progressing   Problem: Health Behavior/Discharge Planning: Goal: Ability to manage health-related needs will improve Outcome: Progressing   Problem: Clinical Measurements: Goal: Ability to maintain clinical measurements within normal limits will improve Outcome: Progressing Goal: Will remain free from infection Outcome: Progressing Goal: Diagnostic test results will improve Outcome: Progressing Goal: Respiratory complications will improve Outcome: Progressing Goal: Cardiovascular complication will be avoided Outcome: Progressing   Problem: Elimination: Goal: Will not experience complications related to bowel motility Outcome: Progressing Goal: Will not experience complications related to urinary retention Outcome: Progressing   Problem: Pain Managment: Goal: General experience of comfort will improve Outcome: Progressing

## 2022-08-14 DIAGNOSIS — A419 Sepsis, unspecified organism: Secondary | ICD-10-CM | POA: Diagnosis not present

## 2022-08-14 DIAGNOSIS — R6521 Severe sepsis with septic shock: Secondary | ICD-10-CM | POA: Diagnosis not present

## 2022-08-14 LAB — CBC
HCT: 33.4 % — ABNORMAL LOW (ref 39.0–52.0)
Hemoglobin: 10.1 g/dL — ABNORMAL LOW (ref 13.0–17.0)
MCH: 30.1 pg (ref 26.0–34.0)
MCHC: 30.2 g/dL (ref 30.0–36.0)
MCV: 99.7 fL (ref 80.0–100.0)
Platelets: 324 10*3/uL (ref 150–400)
RBC: 3.35 MIL/uL — ABNORMAL LOW (ref 4.22–5.81)
RDW: 20.5 % — ABNORMAL HIGH (ref 11.5–15.5)
WBC: 11.4 10*3/uL — ABNORMAL HIGH (ref 4.0–10.5)
nRBC: 0 % (ref 0.0–0.2)

## 2022-08-14 LAB — BASIC METABOLIC PANEL
Anion gap: 7 (ref 5–15)
BUN: 12 mg/dL (ref 8–23)
CO2: 25 mmol/L (ref 22–32)
Calcium: 7.5 mg/dL — ABNORMAL LOW (ref 8.9–10.3)
Chloride: 108 mmol/L (ref 98–111)
Creatinine, Ser: 0.9 mg/dL (ref 0.61–1.24)
GFR, Estimated: >60 mL/min (ref >60)
Glucose, Bld: 70 mg/dL (ref 70–99)
Potassium: 4 mmol/L (ref 3.5–5.1)
Sodium: 140 mmol/L (ref 135–145)

## 2022-08-14 LAB — PHOSPHORUS: Phosphorus: 2.3 mg/dL — ABNORMAL LOW (ref 2.5–4.6)

## 2022-08-14 LAB — MAGNESIUM: Magnesium: 1.8 mg/dL (ref 1.7–2.4)

## 2022-08-14 LAB — CULTURE, BLOOD (ROUTINE X 2)

## 2022-08-14 MED ORDER — GUAIFENESIN ER 600 MG PO TB12
600.0000 mg | ORAL_TABLET | Freq: Two times a day (BID) | ORAL | Status: DC
  Administered 2022-08-14 – 2022-08-17 (×6): 600 mg via ORAL
  Filled 2022-08-14 (×7): qty 1

## 2022-08-14 MED ORDER — PREDNISONE 20 MG PO TABS
40.0000 mg | ORAL_TABLET | Freq: Every day | ORAL | Status: DC
  Administered 2022-08-15 – 2022-08-17 (×3): 40 mg via ORAL
  Filled 2022-08-14 (×3): qty 2

## 2022-08-14 MED ORDER — METHYLPREDNISOLONE SODIUM SUCC 40 MG IJ SOLR
40.0000 mg | Freq: Two times a day (BID) | INTRAMUSCULAR | Status: AC
  Administered 2022-08-14 (×2): 40 mg via INTRAVENOUS
  Filled 2022-08-14 (×2): qty 1

## 2022-08-14 MED ORDER — ENSURE ENLIVE PO LIQD
237.0000 mL | Freq: Three times a day (TID) | ORAL | Status: DC
  Administered 2022-08-14 – 2022-08-17 (×10): 237 mL via ORAL

## 2022-08-14 MED ORDER — ALBUTEROL SULFATE (2.5 MG/3ML) 0.083% IN NEBU
2.5000 mg | INHALATION_SOLUTION | Freq: Four times a day (QID) | RESPIRATORY_TRACT | Status: DC
  Administered 2022-08-14 (×2): 2.5 mg via RESPIRATORY_TRACT
  Filled 2022-08-14 (×3): qty 3

## 2022-08-14 NOTE — Progress Notes (Signed)
[facility] Progress Note  Patient: Daniel Eaton.    KGM:[id number]  DOA: 08/08/2022     Date of Service: the patient was seen and examined on 08/14/2022  Chief Complaint  Patient presents with   Abnormal Lab   Brief hospital course: Daniel Eaton. is a 82 y.o. male with medical history significant for HFrEF(35-40%), hypertension, COPD, A-fib, on Eliquis, PAD, anemia and hypothyroidism presented from his facility with left leg pain and abnormal labs which include significant leukocytosis and AKI.   Patient was recently admitted at [facility] from 5/8 till 5/15 due to left lower extremity hematoma burst, he was seen by vascular surgery and discharged with wound VAC along with 10 days of antibiotics with Cipro and Zyvox.  Wound cultures at that time grew MRSA and procidentia.  His home Eliquis was held at that time with instructions to resume on 07/12/2022.  Patient was being followed up by vascular surgery as outpatient and recently wound VAC was removed.   ED course.  Patient was found to be hypotensive which responded initially to IV fluid resuscitation.  Labs pertinent for leukocytosis at 31.2, absolute neutrophil 28.7, hemoglobin 11, platelets 450, MCV 102, creatinine 1.84,(baseline appears to be around 1) albumin 1.9, lactic acid 2 >>9.0.  UA with mild hematuria and pyuria.  Chest x-ray with mild interstitial edema and pulmonary vascular congestion.  Renal ultrasound with no hydronephrosis.  CT abdomen and pelvis was negative for any acute abnormality. CT lower extremities with diffuse skin thickening and subcutaneous edema of right lower extremity most consistent with cellulitis.  Interval decrease in the size of subcutaneous collection/hematoma in the medial proximal right calf as compared to prior CT.  Also shows diffused edema of left lower extremity with interval resolution of previously seen collection/hematoma and skin wound.  No osseous abnormality.   Cultures were drawn and patient  was admitted for concern of sepsis with septic shock.  Initially started on cefepime and vancomycin   Assessment and Plan: Severe sepsis with septic shock [facility]) Patient met severe sepsis criteria with septic shock with leukocytosis, AKI, and severe lactic acidosis, peaked at more than 9, still elevated at 2.9 Likely secondary to UTI, patient came with Foley catheter in place which was replaced in ED. Blood cultures with E. coli and Klebsiella pneumonia, ESBL Urine cultures also growing Klebsiella pneumonia Initially received cefepime and vancomycin which was later switched to meropenem.  Vancomycin was discontinued. D/c'd meropenem and started on ertapenem 1 g IV daily on 6/28 as per ID. 6/27 blood culture NGTD. Foley was removed on 6/26-voiding well Continue ertapenem 1 g IV daily for 10 days End date 08/20/2022 Please pull PIC at completion of IV antibiotics. Weekly labs CBC with differential and CMP    COPD exacerbation noticed on 6/30 Started Solu-Medrol 40 mg IV twice daily x 2 doses followed by prednisone 40 mg p.o. daily for 4 days Continued Breo Ellipta inhaler and Incruse Ellipta inhaler Started albuterol nebulizer every 6 hourly scheduled, transition to as needed after improvement Started Mucinex 600 mg p.o. twice daily for 7 days   Hypoglycemia early morning and hypothermia Unknown cause RN was advised to use hypoglycemia protocol and check CBG Repeat cortisol level wnl check insulin level   Hypophosphatemia secondary to poor nutrition Phosphorus repleted.  Monitor electrolytes.   Lactic acidosis Lactic acid peaked at above 9 Secondary to sepsis.  Now trending down, LA 1.4  -Continue to monitor   Acute renal failure superimposed on stage 3a  chronic kidney disease (HCC) Improving, creatinine at 1.44, baseline around 1. -Monitor renal function -Avoid nephrotoxins   Atrial fibrillation (HCC) Currently rate controlled. -Holding home metoprolol due to softer blood  pressure -Continue with amiodarone and Xarelto   Hypothyroid -Continue Synthroid   PAD (peripheral artery disease) (HCC) Being followed up by vascular surgery as outpatient. 1+ pedal pulses. -Patient is not on any aspirin or statin at home. -On Xarelto   Cardiomyopathy  EF of 30 to 40% on prior echo.  Severely dilated both ventricles. Clinically appears euvolemic -Holding home torsemide and spironolactone due to softer blood pressure -BNP at 489   Macrocytic anemia Being managed by hematology as outpatient.  Hemoglobin seems improving, at 11.  History of B12 deficiency -Continue with home B12 and iron supplement   Essential hypertension Blood pressure currently soft.  Not needing any pressors but holding home antihypertensives.   Body mass index is 34.62 kg/m.  Interventions:  Pressure Injury 06/25/22 Thigh Left;Posterior;Proximal;Right Stage 2 -  Partial thickness loss of dermis presenting as a shallow open injury with a red, pink wound bed without slough. skin tear (Active)  06/25/22 2230  Location: Thigh  Location Orientation: Left;Posterior;Proximal;Right  Staging: Stage 2 -  Partial thickness loss of dermis presenting as a shallow open injury with a red, pink wound bed without slough.  Wound Description (Comments): skin tear  Present on Admission:      Pressure Injury 08/08/22 Coccyx Medial Stage 2 -  Partial thickness loss of dermis presenting as a shallow open injury with a red, pink wound bed without slough. Stage 2 Coccyx & MASD (Active)  08/08/22 2045  Location: Coccyx  Location Orientation: Medial  Staging: Stage 2 -  Partial thickness loss of dermis presenting as a shallow open injury with a red, pink wound bed without slough.  Wound Description (Comments): Stage 2 Coccyx & MASD  Present on Admission: Yes  Dressing Type Foam - Lift dressing to assess site every shift 08/14/22 1015     Pressure Injury 08/08/22 Back Left;Upper;Medial Deep Tissue Pressure  Injury - Purple or maroon localized area of discolored intact skin or blood-filled blister due to damage of underlying soft tissue from pressure and/or shear. Left Mid/Upper Back (Active)  08/08/22 1900  Location: Back  Location Orientation: Left;Upper;Medial  Staging: Deep Tissue Pressure Injury - Purple or maroon localized area of discolored intact skin or blood-filled blister due to damage of underlying soft tissue from pressure and/or shear.  Wound Description (Comments): Left Mid/Upper Back  Present on Admission: Yes  Dressing Type Foam - Lift dressing to assess site every shift 08/14/22 1015     Diet: Regular diet DVT Prophylaxis: Therapeutic Anticoagulation with Xarelto    Advance goals of care discussion: Full code  Family Communication: family was present at bedside, at the time of interview.  The pt provided permission to discuss medical plan with the family. Opportunity was given to ask question and all questions were answered satisfactorily.   Disposition:  Pt is from SNF, admitted with sepsis, still patient is hypothermic and on IV antibiotics, which precludes a safe discharge. Discharge to SNF, when clinically stable, patient may need 1-2 more days to improve. Most likely discharge on Monday   Subjective: No significant overnight events, patient was lying comfortably.  Patient still has hypothermia, feels cold and he is in multiple blankets.  Patient does not feel energetic, still has lethargy and lack of energy.  Patient's wife was at bedside and she thinks he is little  bit confused today.  Patient did not offer any specific complaints. As per patient's wife patient is more sleepy.   Physical Exam: General: NAD, lying comfortably Appear in no distress, affect appropriate Eyes: PERRLA ENT: Oral Mucosa Clear, moist  Neck: no JVD,  Cardiovascular: S1 and S2 Present, no Murmur,  Respiratory: good respiratory effort, Bilateral Air entry equal and Decreased, no Crackles,  mild wheezing audible without cystoscope most likely coming from throat, no wheezing on auscultation. Abdomen: Bowel Sound present, Soft and no tenderness,  Skin: no rashes Extremities: No Pedal edema, no calf tenderness. S/p rupture of hematoma, dressing CDI Neurologic: without any new focal findings Gait not checked due to patient safety concerns  Vitals:   08/14/22 0602 08/14/22 0831 08/14/22 1015 08/14/22 1135  BP: 138/87 (!) 141/82  (!) 130/91  Pulse: (!) 102 (!) 107  94  Resp: 17 16 18 18   Temp: 99.2 F (37.3 C) 98.6 F (37 C)  98 F (36.7 C)  TempSrc: Axillary Oral    SpO2: 96% 95%  98%  Weight:      Height:        Intake/Output Summary (Last 24 hours) at 08/14/2022 1311 Last data filed at 08/14/2022 0609 Gross per 24 hour  Intake 940 ml  Output 600 ml  Net 340 ml   Filed Weights   08/08/22 1440 08/10/22 0554  Weight: 108.9 kg 112.6 kg    Data Reviewed: I have personally reviewed and interpreted daily labs, tele strips, imagings as discussed above. I reviewed all nursing notes, pharmacy notes, vitals, pertinent old records I have discussed plan of care as described above with RN and patient/family.  CBC: Recent Labs  Lab 08/08/22 1415 08/10/22 0432 08/11/22 0539 08/13/22 0432 08/14/22 0418  WBC 31.2* 15.3* 11.1* 10.6* 11.4*  NEUTROABS 28.7*  --   --   --   --   HGB 11.0* 8.8* 9.1* 9.9* 10.1*  HCT 36.1* 28.4* 30.0* 32.5* 33.4*  MCV 102.0* 99.0 101.0* 99.1 99.7  PLT 450* 347 341 331 324   Basic Metabolic Panel: Recent Labs  Lab 08/08/22 2150 08/10/22 0432 08/11/22 0539 08/13/22 0432 08/14/22 0418  NA 136 137 137 137 140  K 4.6 3.4* 3.4* 4.1 4.0  CL 105 109 107 109 108  CO2 20* 21* 23 21* 25  GLUCOSE 64* 95 71 67* 70  BUN 19 22 18 14 12   CREATININE 1.70* 1.44* 1.29* 0.94 0.90  CALCIUM 7.8* 7.4* 7.3* 7.4* 7.5*  MG 1.9 2.0  --  1.9 1.8  PHOS  --   --  2.7 1.5* 2.3*    Studies: No results found.  Scheduled Meds:  albuterol  2.5 mg  Nebulization Q6H   amiodarone  200 mg Oral q morning   vitamin B-12  1,000 mcg Oral Daily   feeding supplement  237 mL Oral TID BM   ferrous sulfate  325 mg Oral Daily   fluticasone  2 spray Each Nare Daily   fluticasone furoate-vilanterol  1 puff Inhalation Daily   And   umeclidinium bromide  1 puff Inhalation Daily   Gerhardt's butt cream   Topical TID   guaiFENesin  600 mg Oral BID   levothyroxine  88 mcg Oral Daily   loratadine  10 mg Oral Daily   methylPREDNISolone (SOLU-MEDROL) injection  40 mg Intravenous Q12H   Followed by   Melene Muller ON 08/15/2022] predniSONE  40 mg Oral Q breakfast   multivitamin with minerals  1 tablet Oral Q lunch  mupirocin ointment  1 Application Nasal BID   pantoprazole  40 mg Oral Daily   phosphorus  500 mg Oral TID   rivaroxaban  20 mg Oral Q supper   sertraline  50 mg Oral QHS   traZODone  25 mg Oral QHS   Continuous Infusions:  ertapenem Stopped (08/14/22 0609)   PRN Meds: HYDROcodone-acetaminophen, polyvinyl alcohol  Time spent: 35 minutes  Author: Gillis Santa. MD [facility] 08/14/2022 1:11 PM  To reach On-call, see care teams to locate the attending and reach out to them via [email or web address]. If 7PM-7AM, please contact night-coverage If you still have difficulty reaching the attending provider, please page the [facility] (Director on Call) for [facility] on amion for assistance.

## 2022-08-14 NOTE — Plan of Care (Signed)

## 2022-08-15 DIAGNOSIS — A419 Sepsis, unspecified organism: Secondary | ICD-10-CM | POA: Diagnosis not present

## 2022-08-15 DIAGNOSIS — R6521 Severe sepsis with septic shock: Secondary | ICD-10-CM | POA: Diagnosis not present

## 2022-08-15 LAB — HEPATIC FUNCTION PANEL
ALT: 11 U/L (ref 0–44)
AST: 16 U/L (ref 15–41)
Albumin: 1.6 g/dL — ABNORMAL LOW (ref 3.5–5.0)
Alkaline Phosphatase: 84 U/L (ref 38–126)
Bilirubin, Direct: <0.1 mg/dL (ref 0.0–0.2)
Total Bilirubin: 0.2 mg/dL — ABNORMAL LOW (ref 0.3–1.2)
Total Protein: 4.2 g/dL — ABNORMAL LOW (ref 6.5–8.1)

## 2022-08-15 LAB — BASIC METABOLIC PANEL
Anion gap: 8 (ref 5–15)
BUN: 17 mg/dL (ref 8–23)
CO2: 23 mmol/L (ref 22–32)
Calcium: 7.5 mg/dL — ABNORMAL LOW (ref 8.9–10.3)
Chloride: 109 mmol/L (ref 98–111)
Creatinine, Ser: 0.84 mg/dL (ref 0.61–1.24)
GFR, Estimated: >60 mL/min (ref >60)
Glucose, Bld: 129 mg/dL — ABNORMAL HIGH (ref 70–99)
Potassium: 4.1 mmol/L (ref 3.5–5.1)
Sodium: 140 mmol/L (ref 135–145)

## 2022-08-15 LAB — CBC
HCT: 32.8 % — ABNORMAL LOW (ref 39.0–52.0)
Hemoglobin: 9.8 g/dL — ABNORMAL LOW (ref 13.0–17.0)
MCH: 30.2 pg (ref 26.0–34.0)
MCHC: 29.9 g/dL — ABNORMAL LOW (ref 30.0–36.0)
MCV: 101.2 fL — ABNORMAL HIGH (ref 80.0–100.0)
Platelets: 352 10*3/uL (ref 150–400)
RBC: 3.24 MIL/uL — ABNORMAL LOW (ref 4.22–5.81)
RDW: 20.6 % — ABNORMAL HIGH (ref 11.5–15.5)
WBC: 7.9 10*3/uL (ref 4.0–10.5)
nRBC: 0.3 % — ABNORMAL HIGH (ref 0.0–0.2)

## 2022-08-15 LAB — CULTURE, BLOOD (ROUTINE X 2)
Culture: NO GROWTH
Culture: NO GROWTH
Special Requests: ADEQUATE

## 2022-08-15 LAB — PHOSPHORUS: Phosphorus: 3.4 mg/dL (ref 2.5–4.6)

## 2022-08-15 LAB — MAGNESIUM: Magnesium: 2 mg/dL (ref 1.7–2.4)

## 2022-08-15 MED ORDER — ALBUTEROL SULFATE (2.5 MG/3ML) 0.083% IN NEBU
2.5000 mg | INHALATION_SOLUTION | Freq: Two times a day (BID) | RESPIRATORY_TRACT | Status: DC
  Administered 2022-08-15 – 2022-08-16 (×2): 2.5 mg via RESPIRATORY_TRACT
  Filled 2022-08-15 (×2): qty 3

## 2022-08-15 MED ORDER — ALBUTEROL SULFATE (2.5 MG/3ML) 0.083% IN NEBU
2.5000 mg | INHALATION_SOLUTION | Freq: Three times a day (TID) | RESPIRATORY_TRACT | Status: DC
  Administered 2022-08-15: 2.5 mg via RESPIRATORY_TRACT
  Filled 2022-08-15: qty 3

## 2022-08-15 MED ORDER — ALBUTEROL SULFATE (2.5 MG/3ML) 0.083% IN NEBU: 2.5000 mg | INHALATION_SOLUTION | Freq: Four times a day (QID) | RESPIRATORY_TRACT | Status: DC | PRN

## 2022-08-15 NOTE — Progress Notes (Signed)
Meet family outside Pt Door crying. Introduced Spiritual Care to family and invited her to share her grief and Pain. Pt expressed losing hope in caring for her significant other. Chaplain offered active listening and encouragement to her.   08/15/22 1400  Spiritual Encounters  Type of Visit Initial  Care provided to: Family  Referral source Chaplain assessment  Reason for visit Urgent spiritual support  OnCall Visit Yes  Spiritual Framework  Presenting Themes Impactful experiences and emotions  Community/Connection Limited  Family Stress Factors Exhausted  Interventions  Spiritual Care Interventions Made Reflective listening;Compassionate presence;Established relationship of care and support;Encouragement  Intervention Outcomes  Outcomes Reduced isolation;Reduced anxiety;Awareness of support  Spiritual Care Plan  Spiritual Care Issues Still Outstanding No further spiritual care needs at this time (see row info)

## 2022-08-15 NOTE — Progress Notes (Signed)
At bedside for midline placement per MD order. Pt to complete 5 days of Merrem in SNF. Attempted x 2 on R and LUA with no success. Unable to cannulate cephalic vessel on LUA. Able to cannulate R cephalic, however, unable to thread the guidewire. Pt unable to adequately extend bilateral arms laterally for any period of time. Brachial and basilic vessels > 3 cm in depth. RN made aware and will notify MD. Recommend IR for alternative access.

## 2022-08-15 NOTE — Care Management Important Message (Signed)
Important Message  Patient Details  Name: Daniel Eaton. MRN: [id number] Date of Birth: [birth year]   Medicare Important Message Given:  Yes  Reviewed Medicare IM with [name] via room phone [phone]).  Copy of Medicare IM given to [name] outside patient's room due to isolation precaution status.     Johnell Comings 08/15/2022, 12:23 PM

## 2022-08-15 NOTE — TOC Progression Note (Addendum)
Transition of Care (TOC) - Progression Note    Patient Details  Name: Daniel Eaton. MRN: [id number] Date of Birth: [birth year]  Transition of Care [facility]) CM/SW Contact  Margarito Liner, LCSW Phone Number: 08/15/2022, 8:36 AM  Clinical Narrative:   CSW started insurance authorization for return to [facility] with IV abx.  9:41 am: Auth approved for SNF [phone]) and EMS [phone]).  Expected Discharge Plan: Skilled Nursing Facility Barriers to Discharge: Continued Medical Work up  Expected Discharge Plan and Services     Post Acute Care Choice: Resumption of Svcs/PTA Provider Living arrangements for the past 2 months: Skilled Nursing Facility                                       Social Determinants of Health (SDOH) Interventions SDOH Screenings   Food Insecurity: No Food Insecurity (08/09/2022)  Housing: Low Risk  (08/09/2022)  Transportation Needs: No Transportation Needs (08/09/2022)  Utilities: Not At Risk (08/09/2022)  Depression (PHQ2-9): Low Risk  (06/06/2022)  Tobacco Use: Medium Risk (08/09/2022)    Readmission Risk Interventions    08/10/2022    3:34 PM  Readmission Risk Prevention Plan  PCP or Specialist Appt within 3-5 Days Complete  Social Work Consult for Recovery Care Planning/Counseling Complete  Palliative Care Screening Not Applicable  Medication Review Oceanographer) Complete

## 2022-08-15 NOTE — Progress Notes (Signed)
Progress Note   Patient: Daniel Eaton. ZOX:[id number] DOB: 03/11/1940 DOA: 08/08/2022     7 DOS: the patient was seen and examined on 08/15/2022    Subjective:  Patient seen and examined at bedside this morning Is alert and oriented x 3 however still feeling weak He tells me for the last 2 years he has not been able to walk Denies nausea vomiting abdominal pain or chest pain    Brief hospital course: "Daniel Eaton. is a 82 y.o. male with medical history significant for HFrEF(35-40%), hypertension, COPD, A-fib, on Eliquis, PAD, anemia and hypothyroidism presented from his facility with left leg pain and abnormal labs which include significant leukocytosis and AKI.   Patient was recently admitted at [facility] from 5/8 till 5/15 due to left lower extremity hematoma burst, he was seen by vascular surgery and discharged with wound VAC along with 10 days of antibiotics with Cipro and Zyvox.  Wound cultures at that time grew MRSA and procidentia.  His home Eliquis was held at that time with instructions to resume on 07/12/2022.  Patient was being followed up by vascular surgery as outpatient and recently wound VAC was removed.   ED course.  Patient was found to be hypotensive which responded initially to IV fluid resuscitation.  Labs pertinent for leukocytosis at 31.2, absolute neutrophil 28.7, hemoglobin 11, platelets 450, MCV 102, creatinine 1.84,(baseline appears to be around 1) albumin 1.9, lactic acid 2 >>9.0.  UA with mild hematuria and pyuria.  Chest x-ray with mild interstitial edema and pulmonary vascular congestion.  Renal ultrasound with no hydronephrosis.  CT abdomen and pelvis was negative for any acute abnormality. CT lower extremities with diffuse skin thickening and subcutaneous edema of right lower extremity most consistent with cellulitis.  Interval decrease in the size of subcutaneous collection/hematoma in the medial proximal right calf as compared to prior CT.  Also shows diffused  edema of left lower extremity with interval resolution of previously seen collection/hematoma and skin wound.  No osseous abnormality."  Assessment and plan Severe sepsis with septic shock [facility]) Patient met severe sepsis criteria with septic shock with leukocytosis, AKI, and severe lactic acidosis, peaked at more than 9, still elevated at 2.9 Likely secondary to UTI, patient came with Foley catheter in place which was replaced in ED. Blood cultures with E. coli and Klebsiella pneumonia, ESBL Urine cultures also growing Klebsiella pneumonia Initially received cefepime and vancomycin which was later switched to meropenem.  Vancomycin was discontinued. D/c'd meropenem and started on ertapenem 1 g IV daily on 6/28 as per ID. 6/27 blood culture NGTD. Foley was removed on 6/26-voiding well Continue ertapenem 1 g IV daily for 10 days End date 08/20/2022 Ir guided Midline requested after failed picc nurse     COPD exacerbation noticed on 6/30 Started Solu-Medrol 40 mg IV twice daily x 2 doses followed by prednisone 40 mg p.o. daily for 4 days Continued Breo Ellipta inhaler and Incruse Ellipta inhaler Started albuterol nebulizer every 6 hourly scheduled, transition to as needed after improvement Started Mucinex 600 mg p.o. twice daily for 7 days     Hypoglycemia early morning and hypothermia Unknown cause RN was advised to use hypoglycemia protocol and check CBG Repeat cortisol level wnl check insulin level     Hypophosphatemia secondary to poor nutrition Phosphorus repleted.  Monitor electrolytes.     Lactic acidosis Lactic acid peaked at above 9 Secondary to sepsis.  Now trending down, LA 1.4  -Continue to monitor  Acute renal failure superimposed on stage 3a chronic kidney disease (HCC) Improving, creatinine at 1.44, baseline around 1. -Monitor renal function -Avoid nephrotoxins   Atrial fibrillation (HCC) Currently rate controlled. -Holding home metoprolol due to softer blood  pressure -Continue with amiodarone and Xarelto   Hypothyroid -Continue Synthroid   PAD (peripheral artery disease) (HCC) Being followed up by vascular surgery as outpatient. 1+ pedal pulses. -Patient is not on any aspirin or statin at home. -On Xarelto   Cardiomyopathy  EF of 30 to 40% on prior echo.  Severely dilated both ventricles. Clinically appears euvolemic -Continue to hold home torsemide and spironolactone due to softer blood pressure -BNP at 489   Macrocytic anemia Being managed by hematology as outpatient.  Hemoglobin seems improving, at 11.  History of B12 deficiency -Continue with home B12 and iron supplement   Essential hypertension Blood pressure currently soft.  Not needing any pressors but holding home antihypertensives.     Data Reviewed: I have personally reviewed the patient's data showing sodium 140 potassium 4.1 albumin 1.6 WBC 7.9 hemoglobin 9.8  Family Communication: Discussed with patient's patner at bedside   Physical Exam: Vitals:   08/15/22 0518 08/15/22 0737 08/15/22 0836 08/15/22 1240  BP: (!) 139/93  (!) 147/94 (!) 148/95  Pulse: 93  86 96  Resp: 18  18 20   Temp: 97.6 F (36.4 C)  98.5 F (36.9 C) 98.3 F (36.8 C)  TempSrc:      SpO2: 97% 97% 98% 99%  Weight:      Height:        Author: Loyce Dys, MD 08/15/2022 3:46 PM  For on call review [email or web address].

## 2022-08-15 NOTE — Plan of Care (Signed)

## 2022-08-16 ENCOUNTER — Inpatient Hospital Stay: Payer: PPO | Admitting: Radiology

## 2022-08-16 DIAGNOSIS — A419 Sepsis, unspecified organism: Secondary | ICD-10-CM | POA: Diagnosis not present

## 2022-08-16 DIAGNOSIS — R6521 Severe sepsis with septic shock: Secondary | ICD-10-CM | POA: Diagnosis not present

## 2022-08-16 HISTORY — PX: IR US GUIDE VASC ACCESS LEFT: IMG2389

## 2022-08-16 LAB — CBC
HCT: 31 % — ABNORMAL LOW (ref 39.0–52.0)
Hemoglobin: 9.2 g/dL — ABNORMAL LOW (ref 13.0–17.0)
MCH: 30.1 pg (ref 26.0–34.0)
MCHC: 29.7 g/dL — ABNORMAL LOW (ref 30.0–36.0)
MCV: 101.3 fL — ABNORMAL HIGH (ref 80.0–100.0)
Platelets: 361 10*3/uL (ref 150–400)
RBC: 3.06 MIL/uL — ABNORMAL LOW (ref 4.22–5.81)
RDW: 20.7 % — ABNORMAL HIGH (ref 11.5–15.5)
WBC: 11.6 10*3/uL — ABNORMAL HIGH (ref 4.0–10.5)
nRBC: 0.2 % (ref 0.0–0.2)

## 2022-08-16 LAB — BASIC METABOLIC PANEL
Anion gap: 8 (ref 5–15)
BUN: 22 mg/dL (ref 8–23)
CO2: 24 mmol/L (ref 22–32)
Calcium: 7.7 mg/dL — ABNORMAL LOW (ref 8.9–10.3)
Chloride: 107 mmol/L (ref 98–111)
Creatinine, Ser: 0.91 mg/dL (ref 0.61–1.24)
GFR, Estimated: >60 mL/min (ref >60)
Glucose, Bld: 87 mg/dL (ref 70–99)
Potassium: 4.1 mmol/L (ref 3.5–5.1)
Sodium: 139 mmol/L (ref 135–145)

## 2022-08-16 LAB — CULTURE, BLOOD (ROUTINE X 2)

## 2022-08-16 MED ORDER — LIDOCAINE-EPINEPHRINE 1 %-1:100000 IJ SOLN
10.0000 mL | Freq: Once | INTRAMUSCULAR | Status: AC
  Administered 2022-08-16: 10 mL via INTRADERMAL

## 2022-08-16 MED ORDER — HEPARIN SOD (PORK) LOCK FLUSH 100 UNIT/ML IV SOLN
500.0000 [IU] | Freq: Once | INTRAVENOUS | Status: AC
  Administered 2022-08-16: 500 [IU] via INTRAVENOUS

## 2022-08-16 MED ORDER — LIDOCAINE 5 % EX PTCH
1.0000 | MEDICATED_PATCH | CUTANEOUS | Status: DC
  Administered 2022-08-16 – 2022-08-17 (×2): 1 via TRANSDERMAL
  Filled 2022-08-16 (×2): qty 1

## 2022-08-16 MED ORDER — SPIRONOLACTONE 25 MG PO TABS
25.0000 mg | ORAL_TABLET | Freq: Every day | ORAL | Status: DC
  Administered 2022-08-16 – 2022-08-17 (×2): 25 mg via ORAL
  Filled 2022-08-16 (×2): qty 1

## 2022-08-16 MED ORDER — LIDOCAINE-EPINEPHRINE 1 %-1:100000 IJ SOLN
INTRAMUSCULAR | Status: AC
  Filled 2022-08-16: qty 1

## 2022-08-16 MED ORDER — LIDOCAINE HCL 1 % IJ SOLN
INTRAMUSCULAR | Status: AC
  Filled 2022-08-16: qty 20

## 2022-08-16 MED ORDER — LIDOCAINE HCL 1 % IJ SOLN
10.0000 mL | Freq: Once | INTRAMUSCULAR | Status: AC
  Administered 2022-08-16: 10 mL via INTRADERMAL

## 2022-08-16 MED ORDER — HEPARIN SOD (PORK) LOCK FLUSH 100 UNIT/ML IV SOLN
INTRAVENOUS | Status: AC
  Filled 2022-08-16: qty 5

## 2022-08-16 NOTE — Procedures (Signed)
Vascular and Interventional Radiology Procedure Note  Patient: Rueben Lins. DOB: 1940-12-23 Medical Record Number: [id number] Note Date/Time: 08/16/22 4:15 PM   Performing Physician: Roanna Banning, MD Assistant(s): None  Diagnosis: Poor IV access  Procedure: TUNNELED CENTRAL VENOUS CATHETER PLACEMENT  Anesthesia: Local Anesthetic Complications: None Estimated Blood Loss: Minimal Specimens:  None  Findings:  Successful placement of left-sided, 27 cm (tip-to-cuff), tunneled central venous (Powerline) catheter with the tip of the catheter in the proximal right atrium.  Plan: Catheter ready for use.  See detailed procedure note with images in PACS. The patient tolerated the procedure well without incident or complication and was returned to Floor Bed in stable condition.    Roanna Banning, MD Vascular [facility] Interventional Radiology [facility] Radiology   Pager. [phone] Clinic. [phone]

## 2022-08-16 NOTE — TOC Progression Note (Signed)
Transition of Care (TOC) - Progression Note    Patient Details  Name: Daniel Eaton. MRN: [id number] Date of Birth: [birth year]  Transition of Care [facility]) CM/SW Contact  Margarito Liner, LCSW Phone Number: 08/16/2022, 1:35 PM  Clinical Narrative: CSW met with patient and significant other to discuss discharge plan. Patient is requesting PT when he returns to SNF because he feels like if he doesn't, he will not get better. MD placed consult.     Expected Discharge Plan: Skilled Nursing Facility Barriers to Discharge: Continued Medical Work up  Expected Discharge Plan and Services     Post Acute Care Choice: Resumption of Svcs/PTA Provider Living arrangements for the past 2 months: Skilled Nursing Facility                                       Social Determinants of Health (SDOH) Interventions SDOH Screenings   Food Insecurity: No Food Insecurity (08/09/2022)  Housing: Low Risk  (08/09/2022)  Transportation Needs: No Transportation Needs (08/09/2022)  Utilities: Not At Risk (08/09/2022)  Depression (PHQ2-9): Low Risk  (06/06/2022)  Tobacco Use: Medium Risk (08/09/2022)    Readmission Risk Interventions    08/10/2022    3:34 PM  Readmission Risk Prevention Plan  PCP or Specialist Appt within 3-5 Days Complete  Social Work Consult for Recovery Care Planning/Counseling Complete  Palliative Care Screening Not Applicable  Medication Review Oceanographer) Complete

## 2022-08-16 NOTE — Progress Notes (Signed)
1910 - In to introduce self to pt. Pt resting comfortably in bed with call bell within reach. Pt requesting drink. Drink provided to pt.   2006 - NT in with pt completing ADL's.   2020 - Pt calling out via call bell requesting nursing staff into pt's room. Pt requested drink and blanket on BLE. Pt requesting night time meds. Advised pt that night time meds are scheduled for 2200. Pt voiced understanding. Call bell, drink, and all belongings within reach.   2200 - In to assess pt and administer night time meds. Pt A&Ox4. No c/o pain. Pt able to take Mucinex whole in applesauce. Additional meds administered per [facility] with all other P.O. medications crushed. Call bell, drink, and all belongings within reach.    2306 - Pt requested additional blankets. States that on a regular basis, feet are cold. Call bell, drink, and all belongings within reach.   0128 - Pt requesting food and drink. Pt provided snack and drink. Call bell, drink, and all belongings within reach.   0236 - Pt used call bell stating that his call bell is "slimey" and that he is thirsty. This nurse wiped down call bell and pt's hands. Provided pt with a drink. Pt also c/o being hot and requested torso be uncovered. This nurse folded down blankets to cover pt's knees and feet, while leaving towel and flat sheet covering pt's torso, per his request. Call bell, drink, and all belongings within reach.   54 - NT x2 and this nurse in with pt. Pt received bath and complete bed change. Provided pt with a drink and scheduled medications. Pt call bell within reach. No further requests at this time.   Between this nurse and the NT, staff has been in the patient's room at least once an hour for approximately 10-30 minutes at minimum to ensure that pt all pt's needs/request have been met.

## 2022-08-16 NOTE — Progress Notes (Addendum)
Progress Note   Patient: Daniel Eaton. WUJ:[id number] DOB: 05-May-1940 DOA: 08/08/2022     8 DOS: the patient was seen and examined on 08/16/2022    Subjective:  Patient seen and examined at bedside this morning Currently awaiting midline placement He complains of right shoulder pain due to chronic arthritis and requested for lidocaine patch which has been prescribed He denied worsening shortness of breath nausea vomiting or abdominal pain       Brief hospital course: "Nosson Covault. is a 82 y.o. male with medical history significant for HFrEF(35-40%), hypertension, COPD, A-fib, on Eliquis, PAD, anemia and hypothyroidism presented from his facility with left leg pain and abnormal labs which include significant leukocytosis and AKI.   Patient was recently admitted at [facility] from 5/8 till 5/15 due to left lower extremity hematoma burst, he was seen by vascular surgery and discharged with wound VAC along with 10 days of antibiotics with Cipro and Zyvox.  Wound cultures at that time grew MRSA and procidentia.  His home Eliquis was held at that time with instructions to resume on 07/12/2022.  Patient was being followed up by vascular surgery as outpatient and recently wound VAC was removed.   ED course.  Patient was found to be hypotensive which responded initially to IV fluid resuscitation.  Labs pertinent for leukocytosis at 31.2, absolute neutrophil 28.7, hemoglobin 11, platelets 450, MCV 102, creatinine 1.84,(baseline appears to be around 1) albumin 1.9, lactic acid 2 >>9.0.  UA with mild hematuria and pyuria.  Chest x-ray with mild interstitial edema and pulmonary vascular congestion.  Renal ultrasound with no hydronephrosis.  CT abdomen and pelvis was negative for any acute abnormality. CT lower extremities with diffuse skin thickening and subcutaneous edema of right lower extremity most consistent with cellulitis.  Interval decrease in the size of subcutaneous collection/hematoma in the  medial proximal right calf as compared to prior CT.  Also shows diffused edema of left lower extremity with interval resolution of previously seen collection/hematoma and skin wound.  No osseous abnormality."   Assessment and plan Severe sepsis with septic shock [facility]) Patient met severe sepsis criteria with septic shock with leukocytosis, AKI, and severe lactic acidosis, peaked at more than 9, still elevated at 2.9 Likely secondary to UTI, patient came with Foley catheter in place which was replaced in ED. Blood cultures with E. coli and Klebsiella pneumonia, ESBL Urine cultures also growing Klebsiella pneumonia Initially received cefepime and vancomycin which was later switched to meropenem.  Vancomycin was discontinued. D/c'd meropenem and started on ertapenem 1 g IV daily on 6/28 as per ID. 6/27 blood culture NGTD. Foley was removed on 6/26-voiding well Continue ertapenem 1 g IV daily for 10 days End date 08/20/2022 patient is s/p venous access placement by IR today      COPD exacerbation noticed on 6/30 Started Solu-Medrol 40 mg IV twice daily x 2 doses followed by prednisone 40 mg p.o. daily for 4 days Continue Breo Ellipta inhaler and Incruse Ellipta inhaler Continue albuterol nebulizer every 6 hourly scheduled, transition to as needed after improvement Continue Mucinex 600 mg p.o. twice daily for 7 days     Hypoglycemia early morning and hypothermia Unknown cause RN was advised to use hypoglycemia protocol and check CBG Repeat cortisol level wnl Monitor fingerstick glucose closely     Hypophosphatemia secondary to poor nutrition Phosphorus repleted.  Monitor electrolytes.     Lactic acidosis Lactic acid peaked at above 9 Secondary to sepsis.  Now trending  down, LA 1.4  -Continue to monitor   Acute renal failure superimposed on stage 3a chronic kidney disease (HCC) Improving, creatinine at 1.44, baseline around 1. -Monitor renal function -Avoid nephrotoxins   Atrial  fibrillation (HCC) Currently rate controlled. -Holding home metoprolol due to softer blood pressure -Continue amiodarone and Xarelto   Hypothyroid -Continue Synthroid   PAD (peripheral artery disease) (HCC) Being followed up by vascular surgery as outpatient. 1+ pedal pulses. -Patient is not on any aspirin or statin at home. -On Xarelto   Cardiomyopathy  EF of 30 to 40% on prior echo.  Severely dilated both ventricles. Clinically appears euvolemic -Continue to hold home torsemide due to softer blood pressure spironolactone resumed on 08/16/2022 -BNP at 489   Macrocytic anemia Being managed by hematology as outpatient.  Hemoglobin seems improving, at 11.  History of B12 deficiency -Continue with home B12 and iron supplement   Essential hypertension Blood pressure currently soft.  Not needing any pressors but holding home antihypertensives.     Data Reviewed: I have personally reviewed the patient's data including CBC and BMP results   Family Communication: Discussed with patient's patner at bedside    General: NAD, laying in bed and deconditioned Eyes: PERRLA ENT: Oral Mucosa Clear, moist  Neck: no JVD,  Cardiovascular: S1 and S2 Present, no Murmur,  Respiratory: good respiratory effort but decreased at the bases bilaterally Abdomen: Bowel Sound present, Soft and no tenderness,  Skin: no rashes Extremities: No Pedal edema, no calf tenderness. S/p rupture of hematoma, dressing CDI Neurologic: Patient alert awake able to engage in conversation Vitals:   08/16/22 0806 08/16/22 0806 08/16/22 1206 08/16/22 1415  BP:  (!) 149/100 (!) 141/79 (!) 146/87  Pulse:  93 (!) 104 (!) 101  Resp:  20 20 18   Temp:  97.8 F (36.6 C) 98.4 F (36.9 C) 97.9 F (36.6 C)  TempSrc:      SpO2: 99% 99% 97% 96%  Weight:      Height:         Author: Loyce Dys, MD 08/16/2022 4:39 PM  For on call review [email or web address].

## 2022-08-17 DIAGNOSIS — N184 Chronic kidney disease, stage 4 (severe): Secondary | ICD-10-CM | POA: Diagnosis not present

## 2022-08-17 DIAGNOSIS — G4733 Obstructive sleep apnea (adult) (pediatric): Secondary | ICD-10-CM | POA: Diagnosis not present

## 2022-08-17 DIAGNOSIS — Z743 Need for continuous supervision: Secondary | ICD-10-CM | POA: Diagnosis not present

## 2022-08-17 DIAGNOSIS — L97229 Non-pressure chronic ulcer of left calf with unspecified severity: Secondary | ICD-10-CM | POA: Diagnosis not present

## 2022-08-17 DIAGNOSIS — M6281 Muscle weakness (generalized): Secondary | ICD-10-CM | POA: Diagnosis not present

## 2022-08-17 DIAGNOSIS — R6521 Severe sepsis with septic shock: Secondary | ICD-10-CM | POA: Diagnosis not present

## 2022-08-17 DIAGNOSIS — Z79899 Other long term (current) drug therapy: Secondary | ICD-10-CM | POA: Diagnosis not present

## 2022-08-17 DIAGNOSIS — I872 Venous insufficiency (chronic) (peripheral): Secondary | ICD-10-CM | POA: Diagnosis not present

## 2022-08-17 DIAGNOSIS — R69 Illness, unspecified: Secondary | ICD-10-CM | POA: Diagnosis not present

## 2022-08-17 DIAGNOSIS — I739 Peripheral vascular disease, unspecified: Secondary | ICD-10-CM | POA: Diagnosis not present

## 2022-08-17 DIAGNOSIS — R531 Weakness: Secondary | ICD-10-CM | POA: Diagnosis not present

## 2022-08-17 DIAGNOSIS — Z4901 Encounter for fitting and adjustment of extracorporeal dialysis catheter: Secondary | ICD-10-CM | POA: Diagnosis present

## 2022-08-17 DIAGNOSIS — I509 Heart failure, unspecified: Secondary | ICD-10-CM | POA: Diagnosis not present

## 2022-08-17 DIAGNOSIS — N1831 Chronic kidney disease, stage 3a: Secondary | ICD-10-CM | POA: Diagnosis not present

## 2022-08-17 DIAGNOSIS — I480 Paroxysmal atrial fibrillation: Secondary | ICD-10-CM | POA: Diagnosis not present

## 2022-08-17 DIAGNOSIS — A419 Sepsis, unspecified organism: Secondary | ICD-10-CM | POA: Diagnosis not present

## 2022-08-17 DIAGNOSIS — R4182 Altered mental status, unspecified: Secondary | ICD-10-CM | POA: Diagnosis not present

## 2022-08-17 DIAGNOSIS — D649 Anemia, unspecified: Secondary | ICD-10-CM | POA: Diagnosis not present

## 2022-08-17 LAB — CBC
HCT: 31.7 % — ABNORMAL LOW (ref 39.0–52.0)
Hemoglobin: 9.3 g/dL — ABNORMAL LOW (ref 13.0–17.0)
MCH: 29.7 pg (ref 26.0–34.0)
MCHC: 29.3 g/dL — ABNORMAL LOW (ref 30.0–36.0)
MCV: 101.3 fL — ABNORMAL HIGH (ref 80.0–100.0)
Platelets: 329 10*3/uL (ref 150–400)
RBC: 3.13 MIL/uL — ABNORMAL LOW (ref 4.22–5.81)
RDW: 20.7 % — ABNORMAL HIGH (ref 11.5–15.5)
WBC: 9.9 10*3/uL (ref 4.0–10.5)
nRBC: 0.2 % (ref 0.0–0.2)

## 2022-08-17 LAB — BASIC METABOLIC PANEL
Anion gap: 7 (ref 5–15)
BUN: 26 mg/dL — ABNORMAL HIGH (ref 8–23)
CO2: 25 mmol/L (ref 22–32)
Calcium: 7.7 mg/dL — ABNORMAL LOW (ref 8.9–10.3)
Chloride: 107 mmol/L (ref 98–111)
Creatinine, Ser: 0.91 mg/dL (ref 0.61–1.24)
GFR, Estimated: >60 mL/min (ref >60)
Glucose, Bld: 88 mg/dL (ref 70–99)
Potassium: 4.6 mmol/L (ref 3.5–5.1)
Sodium: 139 mmol/L (ref 135–145)

## 2022-08-17 MED ORDER — GUAIFENESIN ER 600 MG PO TB12: 600.0000 mg | ORAL_TABLET | Freq: Two times a day (BID) | ORAL | 0 refills | Status: DC

## 2022-08-17 MED ORDER — CHLORHEXIDINE GLUCONATE CLOTH 2 % EX PADS
6.0000 | MEDICATED_PAD | Freq: Every day | CUTANEOUS | Status: DC
  Administered 2022-08-17: 6 via TOPICAL

## 2022-08-17 MED ORDER — PREDNISONE 20 MG PO TABS: 40.0000 mg | ORAL_TABLET | Freq: Every day | ORAL | 0 refills | Status: DC

## 2022-08-17 MED ORDER — HYDROCODONE-ACETAMINOPHEN 5-325 MG PO TABS: 1.0000 | ORAL_TABLET | ORAL | 0 refills | Status: DC | PRN

## 2022-08-17 MED ORDER — ERTAPENEM IV (FOR PTA / DISCHARGE USE ONLY)
1.0000 g | INTRAVENOUS | 0 refills | Status: AC
Start: 2022-08-17 — End: 2022-08-21

## 2022-08-17 NOTE — TOC Transition Note (Signed)
Transition of Care [facility]) - CM/SW Discharge Note   Patient Details  Name: Daniel Eaton. MRN: [id number] Date of Birth: Feb 18, 1940  Transition of Care [facility]) CM/SW Contact:  Chapman Fitch, RN Phone Number: 08/17/2022, 3:56 PM   Clinical Narrative:     Patient will DC to: [place]  Anticipated DC date: 08/17/22  Family notified: Significant other Pat at bedside  Transport [id number]  Per MD patient ready for DC to . RN, patient, patient's family, and facility notified of DC. Discharge Summary sent to facility. RN given number for report. DC packet on chart. Ambulance transport requested for patient.   Patient aware that PT did not recommend therapy at facility [facility] at [facility] confirms they can accept a central tunnel catheter in the juglar/atria  TOC signing off.     Barriers to Discharge: Continued Medical Work up   Patient Goals and CMS Choice      Discharge Placement                         Discharge Plan and Services Additional resources added to the After Visit Summary for       Post Acute Care Choice: Resumption of Svcs/PTA Provider                               Social Determinants of Health (SDOH) Interventions SDOH Screenings   Food Insecurity: No Food Insecurity (08/09/2022)  Housing: Low Risk  (08/09/2022)  Transportation Needs: No Transportation Needs (08/09/2022)  Utilities: Not At Risk (08/09/2022)  Depression (PHQ2-9): Low Risk  (06/06/2022)  Tobacco Use: Medium Risk (08/16/2022)     Readmission Risk Interventions    08/10/2022    3:34 PM  Readmission Risk Prevention Plan  PCP or Specialist Appt within 3-5 Days Complete  Social Work Consult for Recovery Care Planning/Counseling Complete  Palliative Care Screening Not Applicable  Medication Review Oceanographer) Complete

## 2022-08-17 NOTE — Evaluation (Signed)
Physical Therapy Evaluation Patient Details Name: Daniel Eaton. MRN: [id number] DOB: 10-05-1940 Today's Date: 08/17/2022  History of Present Illness  Pt is an 82 y.o male admitted from [facility] with hypotension and septic shock. patient currently has pressure injuries to hix coccyx, posterior R knee, L scapula and R elbow. received L venous catheter placement in R atrium on 7/2. pmh of paroymal afib, CKD, cariomyopathy, PAD, HTN, bacteremia and COPD.  Clinical Impression  Pt is a pleasant 82 year old male who was admitted for hypotension with septic shock. Pt performs rolling total assist +2 for hygiene. Patient was max assist for hygiene. Patient stated to other care members he was bed bound for 2 years. Unwilling to state to author level mobility prior to current admission.Patient complains of pain in coccyx, R posterior knee, Left scapula and R elbow due to pressure injuries.  Pt demonstrates deficits with mobility, endurance, strength, ROM, pain, balance, coordination and safe DME. Patients current deficits are the patients reported baseline to the current care team. Will sign off on patient at this time, all deficits can be met at next venue of care.        Assistance Recommended at Discharge Frequent or constant Supervision/Assistance  If plan is discharge home, recommend the following:  Can travel by private vehicle  Two people to help with walking and/or transfers;Two people to help with bathing/dressing/bathroom;Assistance with cooking/housework;Assistance with feeding;Assist for transportation;Help with stairs or ramp for entrance   No    Equipment Recommendations None recommended by PT  Recommendations for Other Services       Functional Status Assessment Patient has not had a recent decline in their functional status     Precautions / Restrictions Precautions Precautions: Fall Restrictions Weight Bearing Restrictions: No      Mobility  Bed Mobility Overal  bed mobility: Needs Assistance Bed Mobility: Rolling Rolling: Total assist, +2 for physical assistance         General bed mobility comments: Total assist +2 rolling for hygeine. max assist needed for hygeine post BM.    Transfers                   General transfer comment: unable    Ambulation/Gait               General Gait Details: unable  Stairs            Wheelchair Mobility     Tilt Bed    Modified Rankin (Stroke Patients Only)       Balance Overall balance assessment:  (unable to asses due to inability to achieve positioning.)                                           Pertinent Vitals/Pain Pain Assessment Pain Assessment: PAINAD Breathing: noisy labored breathing, long periods of hyperventilation, Cheyne-Stokes respirations Negative Vocalization: repeated troubled calling out, loud moaning/groaning, crying Facial Expression: facial grimacing Body Language: rigid, fists clenched, knees up, pushing/pulling away, strikes out Consolability: unable to console, distract or reassure PAINAD Score: 10 Pain Location: Hands, L shoulder, R elbow, LE's Pain Descriptors / Indicators: Aching, Burning, Discomfort, Sharp Pain Intervention(s): Limited activity within patient's tolerance, Monitored during session    Home Living Family/patient expects to be discharged to:: Skilled nursing facility  Prior Function Prior Level of Function : Needs assist       Physical Assist : Mobility (physical);ADLs (physical) Mobility (physical): Bed mobility;Transfers ADLs (physical): Feeding Mobility Comments: Patients wife reported to nursing that he has been bed bound for 2 years. ADLs Comments: Max assist needed for ADL's.     Hand Dominance        Extremity/Trunk Assessment   Upper Extremity Assessment Upper Extremity Assessment: Generalized weakness    Lower Extremity Assessment Lower Extremity  Assessment: Generalized weakness       Communication   Communication: HOH  Cognition Arousal/Alertness: Awake/alert Behavior During Therapy: WFL for tasks assessed/performed, Anxious, Agitated Overall Cognitive Status: Within Functional Limits for tasks assessed                                 General Comments: Patient is very talkative, aims to focus on PLOF 2 years ago before becoming bed bound.        General Comments      Exercises Other Exercises Other Exercises: rolling b/l total a +2 and max assist for hygeine.   Assessment/Plan    PT Assessment All further PT needs can be met in the next venue of care  PT Problem List Decreased strength;Decreased activity tolerance;Decreased range of motion;Decreased balance;Decreased mobility;Decreased coordination;Decreased knowledge of use of DME;Decreased safety awareness;Obesity;Pain       PT Treatment Interventions      PT Goals (Current goals can be found in the Care Plan section)  Acute Rehab PT Goals Patient Stated Goal: to be stronger PT Goal Formulation: With patient Time For Goal Achievement: 08/31/22 Potential to Achieve Goals: Poor    Frequency       Co-evaluation               AM-PAC PT "6 Clicks" Mobility  Outcome Measure Help needed turning from your back to your side while in a flat bed without using bedrails?: Total Help needed moving from lying on your back to sitting on the side of a flat bed without using bedrails?: Total Help needed moving to and from a bed to a chair (including a wheelchair)?: Total Help needed standing up from a chair using your arms (e.g., wheelchair or bedside chair)?: Total Help needed to walk in hospital room?: Total Help needed climbing 3-5 steps with a railing? : Total 6 Click Score: 6    End of Session   Activity Tolerance: Patient limited by pain Patient left: in bed;with call bell/phone within reach Nurse Communication: Mobility status PT Visit  Diagnosis: Muscle weakness (generalized) (M62.81);History of falling (Z91.81);Difficulty in walking, not elsewhere classified (R26.2);Pain Pain - Right/Left:  (B/L) Pain - part of body: Hand;Leg;Shoulder;Arm (Left scapula and R elbow.)    Time: 4540-9811 PT Time Calculation (min) (ACUTE ONLY): 51 min   Charges:                 Malachi Carl, SPT   Malachi Carl 08/17/2022, 1:20 PM

## 2022-08-17 NOTE — Discharge Summary (Signed)
Physician Discharge Summary   Patient: Daniel Eaton. MRN: [id number] DOB: Nov 27, 1940  Admit date:     08/08/2022  Discharge date: 08/17/22  Discharge Physician: Loyce Dys   PCP: Lauro Regulus, MD      Discharge Diagnoses: Severe sepsis with septic shock [facility]) Continue ertapenem 1 g IV daily for 10 days End date 08/20/2022 patient is s/p venous access placement by IR today COPD exacerbation noticed on 6/30 Hypoglycemia early morning and hypothermia Hypophosphatemia secondary to poor nutrition lactic acidosis Acute renal failure superimposed on stage 3a chronic kidney disease (HCC) Atrial fibrillation (HCC) Hypothyroid PAD (peripheral artery disease) (HCC) Cardiomyopathy  Macrocytic anemia Essential hypertension   Hospital Course: Daniel Eaton. is a 82 y.o. male with medical history significant for HFrEF(35-40%), hypertension, COPD, A-fib, on Eliquis, PAD, anemia and hypothyroidism presented from his facility with left leg pain and abnormal labs which include significant leukocytosis and AKI.   Patient was recently admitted at [facility] from 5/8 till 5/15 due to left lower extremity hematoma burst, he was seen by vascular surgery and discharged with wound VAC along with 10 days of antibiotics with Cipro and Zyvox.  Wound cultures at that time grew MRSA and procidentia.  His home Eliquis was held at that time with instructions to resume on 07/12/2022.  Patient was being followed up by vascular surgery as outpatient and recently wound VAC was removed.   ED course.  Patient was found to be hypotensive which responded initially to IV fluid resuscitation.  Labs pertinent for leukocytosis at 31.2, absolute neutrophil 28.7, hemoglobin 11, platelets 450, MCV 102, creatinine 1.84,(baseline appears to be around 1) albumin 1.9, lactic acid 2 >>9.0.  UA with mild hematuria and pyuria.  Chest x-ray with mild interstitial edema and pulmonary vascular congestion.  Renal ultrasound with no  hydronephrosis.  CT abdomen and pelvis was negative for any acute abnormality. CT lower extremities with diffuse skin thickening and subcutaneous edema of right lower extremity most consistent with cellulitis.  Interval decrease in the size of subcutaneous collection/hematoma in the medial proximal right calf as compared to prior CT.  Also shows diffused edema of left lower extremity with interval resolution of previously seen collection/hematoma and skin wound.  No osseous abnormality.  Patient required several broad-spectrum IV antibiotics due to septic shock and was seen by infectious disease.  Blood culture result showed E. coli and Klebsiella pneumonia ESBL, urine culture showed Klebsiella pneumonia.  Patient currently on IV ertapenem 1 g IV daily for 10 days with end date on 08/20/2022 as recommended by infectious disease.   Consultants: Interventional radiology, infectious disease Procedures performed: Central venous catheter placement Disposition: Skilled nursing facility Diet recommendation:  Cardiac diet    Discharge examination  General: NAD, laying in bed and deconditioned Eyes: PERRLA ENT: Oral Mucosa Clear, moist  Neck: no JVD,  Cardiovascular: S1 and S2 Present, no Murmur,  Respiratory: good respiratory effort but decreased at the bases bilaterally Abdomen: Bowel Sound present, Soft and no tenderness,  Skin: no rashes Extremities: No Pedal edema, no calf tenderness. S/p rupture of hematoma, dressing CDI Neurologic: Patient alert awake able to engage in conversation  Condition at discharge: good    DISCHARGE MEDICATION: Allergies as of 08/17/2022       Reactions   Diltiazem Hcl Itching, Other (See Comments)   edema        Medication List     STOP taking these medications    alum & mag hydroxide-simeth 200-200-20 MG/5ML  suspension Commonly known as: MAALOX/MYLANTA   apixaban 5 MG Tabs tablet Commonly known as: ELIQUIS   ASPERCREME PAIN RELIEF PATCH EX    Gold Bond Extra Strength 0.8 % Powd Generic drug: Menthol (Topical Analgesic)   metoprolol succinate 50 MG 24 hr tablet Commonly known as: TOPROL-XL   potassium chloride SA 20 MEQ tablet Commonly known as: KLOR-CON M   Refresh Celluvisc 1 % Gel Generic drug: Carboxymethylcellulose Sod PF   torsemide 20 MG tablet Commonly known as: DEMADEX       TAKE these medications    acetaminophen 500 MG tablet Commonly known as: TYLENOL Take 500 mg by mouth every 12 (twelve) hours.   albuterol 108 (90 Base) MCG/ACT inhaler Commonly known as: VENTOLIN HFA Inhale 2 puffs into the lungs every 6 (six) hours as needed for wheezing or shortness of breath.   amiodarone 200 MG tablet Commonly known as: PACERONE Take 200 mg by mouth every morning.   Cyanocobalamin 1000 MCG/ML Liqd Take 1,000 mcg by mouth daily.   ertapenem  IVPB Commonly known as: INVANZ Inject 1 g into the vein daily for 4 days. Indication:  ESBL E coli bacteremia, K. Pneumoniae bacteremia Last Day of Therapy:  08/20/2022 Labs - Once weekly:  CBC/D and CMP Please pull PIC/midline at completion of IV antibiotics Fax weekly lab results  promptly to [phone] Method of administration: Mini-Bag Plus / Gravity Method of administration may be changed at the discretion of facility/pharmacy   ferrous sulfate 325 (65 FE) MG tablet Take 1 tablet (325 mg total) by mouth daily.   fluticasone 50 MCG/ACT nasal spray Commonly known as: FLONASE Place 2 sprays into both nostrils daily.   guaiFENesin 600 MG 12 hr tablet Commonly known as: MUCINEX Take 1 tablet (600 mg total) by mouth 2 (two) times daily.   Hemorrhoidal 1-0.25-14.4-15 % Crea Generic drug: Pramox-PE-Glycerin-Petrolatum Place 1 application  rectally every 6 (six) hours as needed (pain).   HYDROcodone-acetaminophen 5-325 MG tablet Commonly known as: NORCO/VICODIN Take 1 tablet by mouth every 4 (four) hours as needed for moderate pain.   levothyroxine 88 MCG  tablet Commonly known as: SYNTHROID Take 88 mcg by mouth daily.   loratadine 10 MG tablet Commonly known as: CLARITIN Take 10 mg by mouth daily.   Multi-Vitamins Tabs Take 1 tablet by mouth daily.   omeprazole 20 MG capsule Commonly known as: PRILOSEC Take 20 mg by mouth daily before supper.   polyethylene glycol 17 g packet Commonly known as: MIRALAX / GLYCOLAX Take 17 g by mouth daily as needed. What changed: when to take this   predniSONE 20 MG tablet Commonly known as: DELTASONE Take 2 tablets (40 mg total) by mouth daily with breakfast. Start taking on: August 18, 2022   rivaroxaban 20 MG Tabs tablet Commonly known as: XARELTO Take 20 mg by mouth daily with supper.   senna-docusate 8.6-50 MG tablet Commonly known as: Senokot-S Take 1 tablet by mouth 2 (two) times daily.   sertraline 50 MG tablet Commonly known as: ZOLOFT Take 50 mg by mouth at bedtime.   spironolactone 25 MG tablet Commonly known as: ALDACTONE Take 25 mg by mouth daily.   traZODone 50 MG tablet Commonly known as: DESYREL Take 25 mg by mouth at bedtime.   Trelegy Ellipta 100-62.5-25 MCG/ACT Aepb Generic drug: Fluticasone-Umeclidin-Vilant Inhale 1 puff into the lungs daily.               Discharge Care Instructions  (From admission, onward)  Start     Ordered   08/17/22 0000  Change dressing on IV access line weekly and PRN  (Home infusion instructions - [facility] )        08/17/22 1414            Contact information for after-discharge care     Destination     [facility] .   Service: Skilled Nursing Contact information: [address] [place] [address] [phone]                      Discharge time spent: 37 minutes    Signed: Loyce Dys, MD [facility] 08/17/2022

## 2022-08-17 NOTE — Discharge Summary (Signed)
  Physician Discharge Summary   Patient: Daniel Eaton. MRN: [id number] DOB: [birth year]  Admit date:     08/08/2022  Discharge date: 08/17/22  Discharge Physician: Loyce Dys   PCP: Lauro Regulus, MD    Discharge Diagnoses: Severe sepsis with septic shock [facility]) Continue ertapenem 1 g IV daily for 10 days End date 08/20/2022 patient is s/p venous access placement by IR today COPD exacerbation noticed on 6/30 Hypoglycemia early morning and hypothermia Hypophosphatemia secondary to poor nutrition lactic acidosis Acute renal failure superimposed on stage 3a chronic kidney disease (HCC) Atrial fibrillation (HCC) Hypothyroid PAD (peripheral artery disease) (HCC) Cardiomyopathy  Macrocytic anemia Essential hypertension   Hospital Course: Daniel Eaton. is a 82 y.o. male with medical history significant for HFrEF(35-40%), hypertension, COPD, A-fib, on Eliquis, PAD, anemia and hypothyroidism presented from his facility with left leg pain and abnormal labs which include significant leukocytosis and AKI.   Patient was recently admitted at [facility] from 5/8 till 5/15 due to left lower extremity hematoma burst, he was seen by vascular surgery and discharged with wound VAC along with 10 days of antibiotics with Cipro and Zyvox.  Wound cultures at that time grew MRSA and procidentia.  His home Eliquis was held at that time with instructions to resume on 07/12/2022.  Patient was being followed up by vascular surgery as outpatient and recently wound VAC was removed.   ED course.  Patient was found to be hypotensive which responded initially to IV fluid resuscitation.  Labs pertinent for leukocytosis at 31.2, absolute neutrophil 28.7, hemoglobin 11, platelets 450, MCV 102, creatinine 1.84,(baseline appears to be around 1) albumin 1.9, lactic acid 2 >>9.0.  UA with mild hematuria and pyuria.  Chest x-ray with mild interstitial edema and pulmonary vascular congestion.  Renal ultrasound with no  hydronephrosis.  CT abdomen and pelvis was negative for any acute abnormality. CT lower extremities with diffuse skin thickening and subcutaneous edema of right lower extremity most consistent with cellulitis.  Interval decrease in the size of subcutaneous collection/hematoma in the medial proximal right calf as compared to prior CT.  Also shows diffused edema of left lower extremity with interval resolution of previously seen collection/hematoma and skin wound.  No osseous abnormality.  Patient required several broad-spectrum IV antibiotics due to septic shock was seen by infectious disease.  Blood culture result shows E. coli and Klebsiella pneumonia ESBL, urine culture showed Klebsiella pneumonia.  Patient currently on IV ertapenem 1 g IV daily for 10 days with end date on 08/20/2022 as recommended by infectious disease.   Consultants: Interventional radiology, infectious disease Procedures performed: Central venous catheter placement Disposition: Skilled nursing facility Diet recommendation:  Cardiac diet    Discharge examination  General: NAD, laying in bed and deconditioned Eyes: PERRLA ENT: Oral Mucosa Clear, moist  Neck: no JVD,  Cardiovascular: S1 and S2 Present, no Murmur,  Respiratory: good respiratory effort but decreased at the bases bilaterally Abdomen: Bowel Sound present, Soft and no tenderness,  Skin: no rashes Extremities: No Pedal edema, no calf tenderness. S/p rupture of hematoma, dressing CDI Neurologic: Patient alert awake able to engage in conversation  Condition at discharge: good   Discharge time spent: 37 minutes  Signed: Loyce Dys, MD [facility] 08/17/2022

## 2022-08-18 LAB — INSULIN AND C-PEPTIDE, SERUM
C-Peptide: 4.5 ng/mL — ABNORMAL HIGH (ref 1.1–4.4)
Insulin: 8.2 u[IU]/mL (ref 2.6–24.9)

## 2022-08-20 DIAGNOSIS — Z79899 Other long term (current) drug therapy: Secondary | ICD-10-CM | POA: Diagnosis not present

## 2022-08-20 DIAGNOSIS — D649 Anemia, unspecified: Secondary | ICD-10-CM | POA: Diagnosis not present

## 2022-08-23 DIAGNOSIS — I872 Venous insufficiency (chronic) (peripheral): Secondary | ICD-10-CM | POA: Diagnosis not present

## 2022-08-23 DIAGNOSIS — L97229 Non-pressure chronic ulcer of left calf with unspecified severity: Secondary | ICD-10-CM | POA: Diagnosis not present

## 2022-08-23 DIAGNOSIS — M6281 Muscle weakness (generalized): Secondary | ICD-10-CM | POA: Diagnosis not present

## 2022-08-23 DIAGNOSIS — I509 Heart failure, unspecified: Secondary | ICD-10-CM | POA: Diagnosis not present

## 2022-08-24 ENCOUNTER — Other Ambulatory Visit: Payer: Self-pay

## 2022-08-24 DIAGNOSIS — N184 Chronic kidney disease, stage 4 (severe): Secondary | ICD-10-CM

## 2022-08-25 NOTE — Progress Notes (Signed)
Patient for Removal of Central Line on Friday 08/26/2022, I called and spoke with Daniel Eaton at [facility] on the phone and gave pre-procedure instructions. Daniel Eaton was made aware to be here at 11a by non-emergeny EMS on stretcher. Daniel Eaton stated understanding.  Called 08/24/2022 Pt is alert and oriented and can sign for himself per Daniel Eaton.

## 2022-08-26 ENCOUNTER — Ambulatory Visit
Admission: RE | Admit: 2022-08-26 | Discharge: 2022-08-26 | Disposition: A | Discharge status: Home / Self Care | Payer: PPO | Source: Ambulatory Visit | Attending: Nurse Practitioner | Admitting: Nurse Practitioner

## 2022-08-26 DIAGNOSIS — Z4901 Encounter for fitting and adjustment of extracorporeal dialysis catheter: Secondary | ICD-10-CM | POA: Insufficient documentation

## 2022-08-26 DIAGNOSIS — N184 Chronic kidney disease, stage 4 (severe): Secondary | ICD-10-CM | POA: Diagnosis not present

## 2022-08-26 HISTORY — PX: IR REMOVAL TUN CV CATH W/O FL: IMG2289

## 2022-08-26 NOTE — Procedures (Signed)
Successful removal of right IJ tunneled HD catheter.   After obtaining consent and performing a time-out, the right upper chest was prepped and draped in the normal sterile fashion. The heparin was removed from both ports. Using moderate manual traction the cuff of the catheter was exposed and the catheter was removed in its entirety. Pressure was held until hemostasis was obtained. A sterile dressing was applied. The patient tolerated the procedure well with no immediate complications.   Alwyn Ren, [place] [phone] 08/26/2022, 2:48 PM

## 2022-08-30 DIAGNOSIS — I872 Venous insufficiency (chronic) (peripheral): Secondary | ICD-10-CM | POA: Diagnosis not present

## 2022-08-30 DIAGNOSIS — M6281 Muscle weakness (generalized): Secondary | ICD-10-CM | POA: Diagnosis not present

## 2022-08-30 DIAGNOSIS — L97229 Non-pressure chronic ulcer of left calf with unspecified severity: Secondary | ICD-10-CM | POA: Diagnosis not present

## 2022-08-30 DIAGNOSIS — I509 Heart failure, unspecified: Secondary | ICD-10-CM | POA: Diagnosis not present

## 2022-08-30 DIAGNOSIS — S81811A Laceration without foreign body, right lower leg, initial encounter: Secondary | ICD-10-CM | POA: Diagnosis not present

## 2022-09-13 DIAGNOSIS — I509 Heart failure, unspecified: Secondary | ICD-10-CM | POA: Diagnosis not present

## 2022-09-13 DIAGNOSIS — S31010A Laceration without foreign body of lower back and pelvis without penetration into retroperitoneum, initial encounter: Secondary | ICD-10-CM | POA: Diagnosis not present

## 2022-09-13 DIAGNOSIS — M6281 Muscle weakness (generalized): Secondary | ICD-10-CM | POA: Diagnosis not present

## 2022-09-17 ENCOUNTER — Other Ambulatory Visit: Payer: Self-pay | Admitting: Family Medicine

## 2022-09-17 ENCOUNTER — Other Ambulatory Visit
Admission: RE | Admit: 2022-09-17 | Discharge: 2022-09-17 | Disposition: A | Discharge status: Home / Self Care | Payer: PPO | Source: Ambulatory Visit | Attending: Family Medicine | Admitting: Family Medicine

## 2022-09-17 DIAGNOSIS — R000 Tachycardia, unspecified: Secondary | ICD-10-CM | POA: Diagnosis not present

## 2022-09-17 DIAGNOSIS — N179 Acute kidney failure, unspecified: Secondary | ICD-10-CM | POA: Diagnosis not present

## 2022-09-17 DIAGNOSIS — J90 Pleural effusion, not elsewhere classified: Secondary | ICD-10-CM | POA: Diagnosis not present

## 2022-09-17 DIAGNOSIS — A4159 Other Gram-negative sepsis: Secondary | ICD-10-CM | POA: Diagnosis not present

## 2022-09-17 DIAGNOSIS — R5383 Other fatigue: Secondary | ICD-10-CM | POA: Insufficient documentation

## 2022-09-17 DIAGNOSIS — N134 Hydroureter: Secondary | ICD-10-CM | POA: Diagnosis not present

## 2022-09-17 DIAGNOSIS — I517 Cardiomegaly: Secondary | ICD-10-CM | POA: Diagnosis not present

## 2022-09-17 DIAGNOSIS — N390 Urinary tract infection, site not specified: Secondary | ICD-10-CM | POA: Diagnosis not present

## 2022-09-17 DIAGNOSIS — A419 Sepsis, unspecified organism: Secondary | ICD-10-CM | POA: Diagnosis not present

## 2022-09-17 DIAGNOSIS — N1330 Unspecified hydronephrosis: Secondary | ICD-10-CM | POA: Diagnosis not present

## 2022-09-17 DIAGNOSIS — R6520 Severe sepsis without septic shock: Secondary | ICD-10-CM | POA: Diagnosis not present

## 2022-09-17 LAB — COMPREHENSIVE METABOLIC PANEL
ALT: 34 U/L (ref 0–44)
AST: 49 U/L — ABNORMAL HIGH (ref 15–41)
Albumin: <1.5 g/dL — ABNORMAL LOW (ref 3.5–5.0)
Alkaline Phosphatase: 251 U/L — ABNORMAL HIGH (ref 38–126)
Anion gap: 11 (ref 5–15)
BUN: 37 mg/dL — ABNORMAL HIGH (ref 8–23)
CO2: 13 mmol/L — ABNORMAL LOW (ref 22–32)
Calcium: 8 mg/dL — ABNORMAL LOW (ref 8.9–10.3)
Chloride: 109 mmol/L (ref 98–111)
Creatinine, Ser: 2.16 mg/dL — ABNORMAL HIGH (ref 0.61–1.24)
GFR, Estimated: 30 mL/min — ABNORMAL LOW (ref >60)
Glucose, Bld: 80 mg/dL (ref 70–99)
Potassium: 4.4 mmol/L (ref 3.5–5.1)
Sodium: 133 mmol/L — ABNORMAL LOW (ref 135–145)
Total Bilirubin: 0.8 mg/dL (ref 0.3–1.2)
Total Protein: 4.4 g/dL — ABNORMAL LOW (ref 6.5–8.1)

## 2022-09-18 ENCOUNTER — Encounter: Payer: Self-pay | Admitting: Emergency Medicine

## 2022-09-18 ENCOUNTER — Emergency Department: Payer: PPO

## 2022-09-18 ENCOUNTER — Other Ambulatory Visit: Payer: Self-pay

## 2022-09-18 ENCOUNTER — Inpatient Hospital Stay
Admission: EM | Admit: 2022-09-18 | Discharge: 2022-09-21 | DRG: 871 | Disposition: A | Discharge status: Hospice | Payer: PPO | Attending: Internal Medicine | Admitting: Internal Medicine

## 2022-09-18 DIAGNOSIS — E785 Hyperlipidemia, unspecified: Secondary | ICD-10-CM | POA: Diagnosis not present

## 2022-09-18 DIAGNOSIS — I482 Chronic atrial fibrillation, unspecified: Secondary | ICD-10-CM | POA: Diagnosis not present

## 2022-09-18 DIAGNOSIS — N1832 Chronic kidney disease, stage 3b: Secondary | ICD-10-CM | POA: Diagnosis not present

## 2022-09-18 DIAGNOSIS — Z79899 Other long term (current) drug therapy: Secondary | ICD-10-CM

## 2022-09-18 DIAGNOSIS — E43 Unspecified severe protein-calorie malnutrition: Secondary | ICD-10-CM | POA: Diagnosis present

## 2022-09-18 DIAGNOSIS — R6520 Severe sepsis without septic shock: Secondary | ICD-10-CM | POA: Diagnosis not present

## 2022-09-18 DIAGNOSIS — Z8673 Personal history of transient ischemic attack (TIA), and cerebral infarction without residual deficits: Secondary | ICD-10-CM

## 2022-09-18 DIAGNOSIS — Z66 Do not resuscitate: Secondary | ICD-10-CM | POA: Diagnosis not present

## 2022-09-18 DIAGNOSIS — R7881 Bacteremia: Secondary | ICD-10-CM | POA: Diagnosis present

## 2022-09-18 DIAGNOSIS — I5022 Chronic systolic (congestive) heart failure: Secondary | ICD-10-CM | POA: Diagnosis not present

## 2022-09-18 DIAGNOSIS — I4891 Unspecified atrial fibrillation: Secondary | ICD-10-CM | POA: Diagnosis present

## 2022-09-18 DIAGNOSIS — Z8614 Personal history of Methicillin resistant Staphylococcus aureus infection: Secondary | ICD-10-CM

## 2022-09-18 DIAGNOSIS — J440 Chronic obstructive pulmonary disease with acute lower respiratory infection: Secondary | ICD-10-CM | POA: Diagnosis present

## 2022-09-18 DIAGNOSIS — Z1152 Encounter for screening for COVID-19: Secondary | ICD-10-CM | POA: Diagnosis not present

## 2022-09-18 DIAGNOSIS — J90 Pleural effusion, not elsewhere classified: Secondary | ICD-10-CM | POA: Diagnosis not present

## 2022-09-18 DIAGNOSIS — N189 Chronic kidney disease, unspecified: Secondary | ICD-10-CM

## 2022-09-18 DIAGNOSIS — R7989 Other specified abnormal findings of blood chemistry: Secondary | ICD-10-CM | POA: Diagnosis present

## 2022-09-18 DIAGNOSIS — G4733 Obstructive sleep apnea (adult) (pediatric): Secondary | ICD-10-CM | POA: Diagnosis not present

## 2022-09-18 DIAGNOSIS — Z6841 Body Mass Index (BMI) 40.0 and over, adult: Secondary | ICD-10-CM | POA: Diagnosis not present

## 2022-09-18 DIAGNOSIS — R531 Weakness: Secondary | ICD-10-CM

## 2022-09-18 DIAGNOSIS — Z87891 Personal history of nicotine dependence: Secondary | ICD-10-CM

## 2022-09-18 DIAGNOSIS — A4159 Other Gram-negative sepsis: Secondary | ICD-10-CM | POA: Diagnosis not present

## 2022-09-18 DIAGNOSIS — Z961 Presence of intraocular lens: Secondary | ICD-10-CM | POA: Diagnosis present

## 2022-09-18 DIAGNOSIS — A419 Sepsis, unspecified organism: Secondary | ICD-10-CM | POA: Diagnosis not present

## 2022-09-18 DIAGNOSIS — I2489 Other forms of acute ischemic heart disease: Secondary | ICD-10-CM | POA: Diagnosis not present

## 2022-09-18 DIAGNOSIS — I5020 Unspecified systolic (congestive) heart failure: Secondary | ICD-10-CM

## 2022-09-18 DIAGNOSIS — Z811 Family history of alcohol abuse and dependence: Secondary | ICD-10-CM

## 2022-09-18 DIAGNOSIS — Z7901 Long term (current) use of anticoagulants: Secondary | ICD-10-CM | POA: Diagnosis not present

## 2022-09-18 DIAGNOSIS — N134 Hydroureter: Secondary | ICD-10-CM | POA: Diagnosis not present

## 2022-09-18 DIAGNOSIS — Z7189 Other specified counseling: Secondary | ICD-10-CM | POA: Diagnosis not present

## 2022-09-18 DIAGNOSIS — N1330 Unspecified hydronephrosis: Secondary | ICD-10-CM | POA: Diagnosis not present

## 2022-09-18 DIAGNOSIS — I4811 Longstanding persistent atrial fibrillation: Secondary | ICD-10-CM | POA: Diagnosis not present

## 2022-09-18 DIAGNOSIS — T148XXA Other injury of unspecified body region, initial encounter: Secondary | ICD-10-CM

## 2022-09-18 DIAGNOSIS — R7889 Finding of other specified substances, not normally found in blood: Secondary | ICD-10-CM | POA: Diagnosis not present

## 2022-09-18 DIAGNOSIS — Z515 Encounter for palliative care: Secondary | ICD-10-CM

## 2022-09-18 DIAGNOSIS — N179 Acute kidney failure, unspecified: Secondary | ICD-10-CM | POA: Diagnosis not present

## 2022-09-18 DIAGNOSIS — R0689 Other abnormalities of breathing: Secondary | ICD-10-CM | POA: Diagnosis not present

## 2022-09-18 DIAGNOSIS — E872 Acidosis, unspecified: Secondary | ICD-10-CM | POA: Diagnosis not present

## 2022-09-18 DIAGNOSIS — L8940 Pressure ulcer of contiguous site of back, buttock and hip, unspecified stage: Secondary | ICD-10-CM | POA: Diagnosis present

## 2022-09-18 DIAGNOSIS — Z8249 Family history of ischemic heart disease and other diseases of the circulatory system: Secondary | ICD-10-CM

## 2022-09-18 DIAGNOSIS — E039 Hypothyroidism, unspecified: Secondary | ICD-10-CM | POA: Diagnosis not present

## 2022-09-18 DIAGNOSIS — Z888 Allergy status to other drugs, medicaments and biological substances status: Secondary | ICD-10-CM

## 2022-09-18 DIAGNOSIS — I517 Cardiomegaly: Secondary | ICD-10-CM | POA: Diagnosis not present

## 2022-09-18 DIAGNOSIS — Z7951 Long term (current) use of inhaled steroids: Secondary | ICD-10-CM

## 2022-09-18 DIAGNOSIS — Z982 Presence of cerebrospinal fluid drainage device: Secondary | ICD-10-CM

## 2022-09-18 DIAGNOSIS — R000 Tachycardia, unspecified: Secondary | ICD-10-CM | POA: Diagnosis not present

## 2022-09-18 DIAGNOSIS — B9620 Unspecified Escherichia coli [E. coli] as the cause of diseases classified elsewhere: Secondary | ICD-10-CM | POA: Diagnosis present

## 2022-09-18 DIAGNOSIS — Z9841 Cataract extraction status, right eye: Secondary | ICD-10-CM

## 2022-09-18 DIAGNOSIS — I429 Cardiomyopathy, unspecified: Secondary | ICD-10-CM | POA: Diagnosis present

## 2022-09-18 DIAGNOSIS — J449 Chronic obstructive pulmonary disease, unspecified: Secondary | ICD-10-CM

## 2022-09-18 DIAGNOSIS — N390 Urinary tract infection, site not specified: Secondary | ICD-10-CM

## 2022-09-18 DIAGNOSIS — R6521 Severe sepsis with septic shock: Secondary | ICD-10-CM | POA: Diagnosis present

## 2022-09-18 DIAGNOSIS — Z7401 Bed confinement status: Secondary | ICD-10-CM | POA: Diagnosis not present

## 2022-09-18 DIAGNOSIS — J189 Pneumonia, unspecified organism: Secondary | ICD-10-CM | POA: Diagnosis not present

## 2022-09-18 DIAGNOSIS — I959 Hypotension, unspecified: Secondary | ICD-10-CM | POA: Diagnosis not present

## 2022-09-18 DIAGNOSIS — N170 Acute kidney failure with tubular necrosis: Secondary | ICD-10-CM | POA: Diagnosis not present

## 2022-09-18 DIAGNOSIS — B961 Klebsiella pneumoniae [K. pneumoniae] as the cause of diseases classified elsewhere: Secondary | ICD-10-CM | POA: Diagnosis not present

## 2022-09-18 DIAGNOSIS — I130 Hypertensive heart and chronic kidney disease with heart failure and stage 1 through stage 4 chronic kidney disease, or unspecified chronic kidney disease: Secondary | ICD-10-CM | POA: Diagnosis not present

## 2022-09-18 DIAGNOSIS — I10 Essential (primary) hypertension: Secondary | ICD-10-CM | POA: Diagnosis present

## 2022-09-18 DIAGNOSIS — R609 Edema, unspecified: Secondary | ICD-10-CM | POA: Diagnosis not present

## 2022-09-18 DIAGNOSIS — Z79890 Hormone replacement therapy (postmenopausal): Secondary | ICD-10-CM

## 2022-09-18 DIAGNOSIS — Z9842 Cataract extraction status, left eye: Secondary | ICD-10-CM

## 2022-09-18 DIAGNOSIS — I739 Peripheral vascular disease, unspecified: Secondary | ICD-10-CM | POA: Diagnosis not present

## 2022-09-18 DIAGNOSIS — R69 Illness, unspecified: Secondary | ICD-10-CM | POA: Diagnosis not present

## 2022-09-18 DIAGNOSIS — Z8052 Family history of malignant neoplasm of bladder: Secondary | ICD-10-CM

## 2022-09-18 LAB — CBC WITH DIFFERENTIAL/PLATELET
Abs Immature Granulocytes: 1.3 10*3/uL — ABNORMAL HIGH (ref 0.00–0.07)
Abs Immature Granulocytes: 1.66 10*3/uL — ABNORMAL HIGH (ref 0.00–0.07)
Basophils Absolute: 0.2 10*3/uL — ABNORMAL HIGH (ref 0.0–0.1)
Basophils Absolute: 0.2 10*3/uL — ABNORMAL HIGH (ref 0.0–0.1)
Basophils Relative: 1 %
Basophils Relative: 1 %
Eosinophils Absolute: 0.1 10*3/uL (ref 0.0–0.5)
Eosinophils Absolute: 0 10*3/uL (ref 0.0–0.5)
Eosinophils Relative: 0 %
Eosinophils Relative: 0 %
HCT: 31.8 % — ABNORMAL LOW (ref 39.0–52.0)
HCT: 32.6 % — ABNORMAL LOW (ref 39.0–52.0)
Hemoglobin: 9.9 g/dL — ABNORMAL LOW (ref 13.0–17.0)
Hemoglobin: 10.2 g/dL — ABNORMAL LOW (ref 13.0–17.0)
Immature Granulocytes: 4 %
Immature Granulocytes: 5 %
Lymphocytes Relative: 4 %
Lymphocytes Relative: 6 %
Lymphs Abs: 1.4 10*3/uL (ref 0.7–4.0)
Lymphs Abs: 2.2 10*3/uL (ref 0.7–4.0)
MCH: 29.6 pg (ref 26.0–34.0)
MCH: 29.7 pg (ref 26.0–34.0)
MCHC: 31.1 g/dL (ref 30.0–36.0)
MCHC: 31.3 g/dL (ref 30.0–36.0)
MCV: 95.2 fL (ref 80.0–100.0)
MCV: 94.8 fL (ref 80.0–100.0)
Monocytes Absolute: 0.4 10*3/uL (ref 0.1–1.0)
Monocytes Absolute: 0.5 10*3/uL (ref 0.1–1.0)
Monocytes Relative: 1 %
Monocytes Relative: 1 %
Neutro Abs: 31 10*3/uL — ABNORMAL HIGH (ref 1.7–7.7)
Neutro Abs: 30.5 10*3/uL — ABNORMAL HIGH (ref 1.7–7.7)
Neutrophils Relative %: 90 %
Neutrophils Relative %: 87 %
Platelets: 414 10*3/uL — ABNORMAL HIGH (ref 150–400)
Platelets: 401 10*3/uL — ABNORMAL HIGH (ref 150–400)
RBC: 3.34 MIL/uL — ABNORMAL LOW (ref 4.22–5.81)
RBC: 3.44 MIL/uL — ABNORMAL LOW (ref 4.22–5.81)
RDW: 19.7 % — ABNORMAL HIGH (ref 11.5–15.5)
RDW: 19.6 % — ABNORMAL HIGH (ref 11.5–15.5)
Smear Review: NORMAL
Smear Review: NORMAL
WBC: 34.4 10*3/uL — ABNORMAL HIGH (ref 4.0–10.5)
WBC: 35 10*3/uL — ABNORMAL HIGH (ref 4.0–10.5)
nRBC: 0.2 % (ref 0.0–0.2)
nRBC: 0.3 % — ABNORMAL HIGH (ref 0.0–0.2)

## 2022-09-18 LAB — COMPREHENSIVE METABOLIC PANEL
ALT: 35 U/L (ref 0–44)
AST: 56 U/L — ABNORMAL HIGH (ref 15–41)
Albumin: 1.5 g/dL — ABNORMAL LOW (ref 3.5–5.0)
Alkaline Phosphatase: 229 U/L — ABNORMAL HIGH (ref 38–126)
Anion gap: 11 (ref 5–15)
BUN: 39 mg/dL — ABNORMAL HIGH (ref 8–23)
CO2: 18 mmol/L — ABNORMAL LOW (ref 22–32)
Calcium: 8.6 mg/dL — ABNORMAL LOW (ref 8.9–10.3)
Chloride: 107 mmol/L (ref 98–111)
Creatinine, Ser: 2.11 mg/dL — ABNORMAL HIGH (ref 0.61–1.24)
GFR, Estimated: 31 mL/min — ABNORMAL LOW (ref >60)
Glucose, Bld: 79 mg/dL (ref 70–99)
Potassium: 4.4 mmol/L (ref 3.5–5.1)
Sodium: 136 mmol/L (ref 135–145)
Total Bilirubin: 1.1 mg/dL (ref 0.3–1.2)
Total Protein: 4.8 g/dL — ABNORMAL LOW (ref 6.5–8.1)

## 2022-09-18 LAB — BLOOD CULTURE ID PANEL (REFLEXED) - BCID2

## 2022-09-18 LAB — URINALYSIS, W/ REFLEX TO CULTURE (INFECTION SUSPECTED)
Bilirubin Urine: NEGATIVE
Glucose, UA: NEGATIVE mg/dL
Ketones, ur: NEGATIVE mg/dL
Nitrite: NEGATIVE
Protein, ur: 100 mg/dL — AB
Specific Gravity, Urine: 1.008 (ref 1.005–1.030)
WBC, UA: >50 WBC/hpf (ref 0–5)
pH: 7 (ref 5.0–8.0)

## 2022-09-18 LAB — BLOOD GAS, VENOUS
Acid-base deficit: 7.5 mmol/L — ABNORMAL HIGH (ref 0.0–2.0)
Bicarbonate: 18.2 mmol/L — ABNORMAL LOW (ref 20.0–28.0)
O2 Saturation: 48.6 %
Patient temperature: 37
pCO2, Ven: 37 mmHg — ABNORMAL LOW (ref 44–60)
pH, Ven: 7.3 (ref 7.25–7.43)
pO2, Ven: 34 mmHg (ref 32–45)

## 2022-09-18 LAB — RESP PANEL BY RT-PCR (RSV, FLU A&B, COVID)  RVPGX2
Influenza A by PCR: NEGATIVE
Influenza B by PCR: NEGATIVE
Resp Syncytial Virus by PCR: NEGATIVE
SARS Coronavirus 2 by RT PCR: NEGATIVE

## 2022-09-18 LAB — PROTIME-INR
INR: 2.5 — ABNORMAL HIGH (ref 0.8–1.2)
Prothrombin Time: 27.1 seconds — ABNORMAL HIGH (ref 11.4–15.2)

## 2022-09-18 LAB — LACTIC ACID, PLASMA
Lactic Acid, Venous: 2.8 mmol/L (ref 0.5–1.9)
Lactic Acid, Venous: 3.3 mmol/L (ref 0.5–1.9)
Lactic Acid, Venous: 3.4 mmol/L (ref 0.5–1.9)
Lactic Acid, Venous: 3 mmol/L (ref 0.5–1.9)

## 2022-09-18 LAB — BRAIN NATRIURETIC PEPTIDE
B Natriuretic Peptide: 524.5 pg/mL — ABNORMAL HIGH (ref 0.0–100.0)
B Natriuretic Peptide: 368.8 pg/mL — ABNORMAL HIGH (ref 0.0–100.0)

## 2022-09-18 LAB — CULTURE, BLOOD (ROUTINE X 2): Special Requests: ADEQUATE

## 2022-09-18 LAB — TROPONIN I (HIGH SENSITIVITY)
Troponin I (High Sensitivity): 63 ng/L — ABNORMAL HIGH (ref <18)
Troponin I (High Sensitivity): 44 ng/L — ABNORMAL HIGH (ref <18)

## 2022-09-18 LAB — MRSA NEXT GEN BY PCR, NASAL: MRSA by PCR Next Gen: DETECTED — AB

## 2022-09-18 LAB — PROCALCITONIN: Procalcitonin: 3.13 ng/mL

## 2022-09-18 LAB — LIPASE, BLOOD: Lipase: 22 U/L (ref 11–51)

## 2022-09-18 MED ORDER — VANCOMYCIN HCL IN DEXTROSE 1-5 GM/200ML-% IV SOLN: 1000.0000 mg | Freq: Once | INTRAVENOUS | Status: DC

## 2022-09-18 MED ORDER — PNEUMOCOCCAL 20-VAL CONJ VACC 0.5 ML IM SUSY
0.5000 mL | PREFILLED_SYRINGE | INTRAMUSCULAR | Status: DC
  Filled 2022-09-18: qty 0.5

## 2022-09-18 MED ORDER — RIVAROXABAN 20 MG PO TABS: 20.0000 mg | ORAL_TABLET | Freq: Every day | ORAL | Status: DC

## 2022-09-18 MED ORDER — SPIRONOLACTONE 25 MG PO TABS
25.0000 mg | ORAL_TABLET | Freq: Every day | ORAL | Status: DC
  Administered 2022-09-19 – 2022-09-20 (×2): 25 mg via ORAL
  Filled 2022-09-18 (×2): qty 1

## 2022-09-18 MED ORDER — ONDANSETRON HCL 4 MG PO TABS: 4.0000 mg | ORAL_TABLET | Freq: Four times a day (QID) | ORAL | Status: DC | PRN

## 2022-09-18 MED ORDER — SODIUM CHLORIDE 0.9 % IV SOLN
1.0000 g | Freq: Once | INTRAVENOUS | Status: AC
  Administered 2022-09-18: 1 g via INTRAVENOUS
  Filled 2022-09-18: qty 20

## 2022-09-18 MED ORDER — SERTRALINE HCL 50 MG PO TABS
50.0000 mg | ORAL_TABLET | Freq: Every day | ORAL | Status: DC
  Administered 2022-09-18 – 2022-09-20 (×3): 50 mg via ORAL
  Filled 2022-09-18 (×3): qty 1

## 2022-09-18 MED ORDER — VANCOMYCIN HCL 750 MG/150ML IV SOLN
750.0000 mg | INTRAVENOUS | Status: DC
  Filled 2022-09-18: qty 150

## 2022-09-18 MED ORDER — LACTATED RINGERS IV BOLUS (SEPSIS)
1000.0000 mL | Freq: Once | INTRAVENOUS | Status: AC
  Administered 2022-09-18: 1000 mL via INTRAVENOUS

## 2022-09-18 MED ORDER — SODIUM CHLORIDE 0.9 % IV SOLN
1.0000 g | Freq: Two times a day (BID) | INTRAVENOUS | Status: AC
  Administered 2022-09-19 (×2): 1 g via INTRAVENOUS
  Filled 2022-09-18 (×2): qty 20

## 2022-09-18 MED ORDER — ALBUTEROL SULFATE HFA 108 (90 BASE) MCG/ACT IN AERS: 2.0000 | INHALATION_SPRAY | Freq: Four times a day (QID) | RESPIRATORY_TRACT | Status: DC | PRN

## 2022-09-18 MED ORDER — FLUTICASONE FUROATE-VILANTEROL 100-25 MCG/ACT IN AEPB
1.0000 | INHALATION_SPRAY | Freq: Every day | RESPIRATORY_TRACT | Status: DC
  Administered 2022-09-19 – 2022-09-20 (×2): 1 via RESPIRATORY_TRACT
  Filled 2022-09-18: qty 28

## 2022-09-18 MED ORDER — METRONIDAZOLE 500 MG/100ML IV SOLN
500.0000 mg | Freq: Once | INTRAVENOUS | Status: AC
  Administered 2022-09-18: 500 mg via INTRAVENOUS
  Filled 2022-09-18: qty 100

## 2022-09-18 MED ORDER — SODIUM CHLORIDE 0.9 % IV SOLN
2.0000 g | Freq: Once | INTRAVENOUS | Status: AC
  Administered 2022-09-18: 2 g via INTRAVENOUS
  Filled 2022-09-18: qty 12.5

## 2022-09-18 MED ORDER — AMIODARONE HCL 200 MG PO TABS
200.0000 mg | ORAL_TABLET | Freq: Every morning | ORAL | Status: DC
  Administered 2022-09-18 – 2022-09-20 (×3): 200 mg via ORAL
  Filled 2022-09-18 (×3): qty 1

## 2022-09-18 MED ORDER — ALBUTEROL SULFATE (2.5 MG/3ML) 0.083% IN NEBU: 2.5000 mg | INHALATION_SOLUTION | Freq: Four times a day (QID) | RESPIRATORY_TRACT | Status: DC | PRN

## 2022-09-18 MED ORDER — LEVOTHYROXINE SODIUM 88 MCG PO TABS
88.0000 ug | ORAL_TABLET | Freq: Every day | ORAL | Status: DC
  Administered 2022-09-19 – 2022-09-20 (×2): 88 ug via ORAL
  Filled 2022-09-18 (×2): qty 1

## 2022-09-18 MED ORDER — PANTOPRAZOLE SODIUM 40 MG PO TBEC
40.0000 mg | DELAYED_RELEASE_TABLET | Freq: Every day | ORAL | Status: DC
  Administered 2022-09-18 – 2022-09-20 (×3): 40 mg via ORAL
  Filled 2022-09-18 (×3): qty 1

## 2022-09-18 MED ORDER — LACTATED RINGERS IV BOLUS (SEPSIS)
610.0000 mL | Freq: Once | INTRAVENOUS | Status: AC
  Administered 2022-09-18: 610 mL via INTRAVENOUS

## 2022-09-18 MED ORDER — ONDANSETRON HCL 4 MG/2ML IJ SOLN: 4.0000 mg | Freq: Four times a day (QID) | INTRAMUSCULAR | Status: DC | PRN

## 2022-09-18 MED ORDER — UMECLIDINIUM BROMIDE 62.5 MCG/ACT IN AEPB
1.0000 | INHALATION_SPRAY | Freq: Every day | RESPIRATORY_TRACT | Status: DC
  Administered 2022-09-19 – 2022-09-20 (×2): 1 via RESPIRATORY_TRACT
  Filled 2022-09-18: qty 7

## 2022-09-18 MED ORDER — VANCOMYCIN HCL 2000 MG/400ML IV SOLN
2000.0000 mg | Freq: Once | INTRAVENOUS | Status: AC
  Administered 2022-09-18: 2000 mg via INTRAVENOUS
  Filled 2022-09-18: qty 400

## 2022-09-18 MED ORDER — LACTATED RINGERS IV SOLN: INTRAVENOUS | Status: DC

## 2022-09-18 MED ORDER — ENOXAPARIN SODIUM 60 MG/0.6ML IJ SOSY: 0.5000 mg/kg | PREFILLED_SYRINGE | INTRAMUSCULAR | Status: DC

## 2022-09-18 MED ORDER — RIVAROXABAN 15 MG PO TABS
15.0000 mg | ORAL_TABLET | Freq: Every day | ORAL | Status: DC
  Administered 2022-09-18 – 2022-09-20 (×3): 15 mg via ORAL
  Filled 2022-09-18 (×3): qty 1

## 2022-09-18 MED ORDER — ACETAMINOPHEN 325 MG PO TABS
650.0000 mg | ORAL_TABLET | Freq: Four times a day (QID) | ORAL | Status: DC | PRN
  Administered 2022-09-18 – 2022-09-19 (×3): 650 mg via ORAL
  Filled 2022-09-18 (×4): qty 2

## 2022-09-18 NOTE — Assessment & Plan Note (Signed)
Creatinine 2.1 today with GFR in the 30s Decompensated renal function in the setting of sepsis Monitor renal function with hydration Minimize nephrotoxic agents Follow

## 2022-09-18 NOTE — ED Notes (Signed)
Bed status has been rejected due to unavailability in the ICU

## 2022-09-18 NOTE — ED Provider Notes (Signed)
[facility] Provider Note    Event Date/Time   First MD Initiated Contact with Patient 09/18/22 [phone]     (approximate)   History   Code Sepsis  Level 5 caveat:  history/ROS limited by acute/critical illness  HPI Daniel Eaton. is a 82 y.o. male  with extensive chronic medical issues coming from a local SNF who presents for evaluation of "abnormal labs".  The paramedics report that they were called out because the patient had some abnormal labs which seem to be a white blood cell count of 32.  No additional details were provided.  The patient was noted to be hypotensive, tachycardic, and tachypneic.  He is awake and alert and says that he feels bad but cannot offer any other specific details.  He is denying pain of any 1 particular area specifically denying chest pain and abdominal pain.  He said that he has chronic wounds on his legs and has had wounds on his arms from a prior skin tear.  He said that he is very thirsty and his mouth is very dry but does not have any other specific complaints or concerns.     Physical Exam   Triage Vital Signs: ED Triage Vitals  Encounter Vitals Group     BP 09/18/22 0757 (!) 80/64     Systolic BP Percentile --      Diastolic BP Percentile --      Pulse Rate 09/18/22 0757 (!) 126     Resp 09/18/22 0757 (!) 26     Temp --      Temp src --      SpO2 09/18/22 0757 98 %     Weight 09/18/22 0755 104.3 kg (230 lb)     Height 09/18/22 0755 1.803 m (5\' 11" )     Head Circumference --      Peak Flow --      Pain Score 09/18/22 0755 0     Pain Loc --      Pain Education --      Exclude from Growth Chart --     Most recent vital signs: Vitals:   09/18/22 0757 09/18/22 0827  BP: (!) 80/64   Pulse: (!) 126   Resp: (!) 26   Temp:  98.3 F (36.8 C)  SpO2: 98%     General: Patient clearly has multiple chronic medical issues but appears acutely ill as well.  Awake and alert and communicative. CV:  Poor peripheral  perfusion, tachycardia, irregular rhythm, no grossly abnormal heart sounds. Resp:  No accessory muscle usage or intercostal retractions but the patient has tachypnea.  Grossly normal lung sounds but exam is limited by patient body habitus. Abd:  Morbid obesity.  Tenderness to palpation throughout the abdomen with the patient yelling particularly with palpation of the right side of his abdomen. Other:  Extensive chronic wounds/cellulitis to both lower extremities.  I removed the bandages that were covered in purulent material.  Regardless of the extent of these wounds, they seem to be more or less chronic with no obvious acute infection.  The patient also has what appears to be a bulla on his right arm and some healing wounds that he states are from prior falls.  When the patient was rolled onto his side to be changed, the nurses called me in the room and we identified numerous pressure ulcers essentially throughout his entire back and sacrum as well as the posterior aspect of both of his legs  with open wounds but no obvious purulence.   ED Results / Procedures / Treatments   Labs (all labs ordered are listed, but only abnormal results are displayed) Labs Reviewed  LACTIC ACID, PLASMA - Abnormal; Notable for the following components:      Result Value   Lactic Acid, Venous 2.8 (*)    All other components within normal limits  LACTIC ACID, PLASMA - Abnormal; Notable for the following components:   Lactic Acid, Venous 3.3 (*)    All other components within normal limits  COMPREHENSIVE METABOLIC PANEL - Abnormal; Notable for the following components:   CO2 18 (*)    BUN 39 (*)    Creatinine, Ser 2.11 (*)    Calcium 8.6 (*)    Total Protein 4.8 (*)    Albumin 1.5 (*)    AST 56 (*)    Alkaline Phosphatase 229 (*)    GFR, Estimated 31 (*)    All other components within normal limits  CBC WITH DIFFERENTIAL/PLATELET - Abnormal; Notable for the following components:   WBC 35.0 (*)    RBC 3.44  (*)    Hemoglobin 10.2 (*)    HCT 32.6 (*)    RDW 19.6 (*)    Platelets 401 (*)    nRBC 0.3 (*)    Neutro Abs 30.5 (*)    Basophils Absolute 0.2 (*)    Abs Immature Granulocytes 1.66 (*)    All other components within normal limits  PROTIME-INR - Abnormal; Notable for the following components:   Prothrombin Time 27.1 (*)    INR 2.5 (*)    All other components within normal limits  BRAIN NATRIURETIC PEPTIDE - Abnormal; Notable for the following components:   B Natriuretic Peptide 368.8 (*)    All other components within normal limits  BLOOD GAS, VENOUS - Abnormal; Notable for the following components:   pCO2, Ven 37 (*)    Bicarbonate 18.2 (*)    Acid-base deficit 7.5 (*)    All other components within normal limits  URINALYSIS, W/ REFLEX TO CULTURE (INFECTION SUSPECTED) - Abnormal; Notable for the following components:   Color, Urine YELLOW (*)    APPearance CLOUDY (*)    Hgb urine dipstick MODERATE (*)    Protein, ur 100 (*)    Leukocytes,Ua LARGE (*)    Bacteria, UA MANY (*)    All other components within normal limits  TROPONIN I (HIGH SENSITIVITY) - Abnormal; Notable for the following components:   Troponin I (High Sensitivity) 63 (*)    All other components within normal limits  RESP PANEL BY RT-PCR (RSV, FLU A&B, COVID)  RVPGX2  CULTURE, BLOOD (ROUTINE X 2)  CULTURE, BLOOD (ROUTINE X 2)  URINE CULTURE  LIPASE, BLOOD  PROCALCITONIN  TROPONIN I (HIGH SENSITIVITY)     EKG  ED ECG REPORT I, Loleta Rose, the attending physician, personally viewed and interpreted this ECG.  Date: 09/18/2022 EKG Time: 7:59 AM Rate: 128 Rhythm: Atrial fibrillation QRS Axis: normal Intervals: Prolonged QTc interval at 565 ms ST/T Wave abnormalities: Non-specific ST segment / T-wave changes, but no clear evidence of acute ischemia. Narrative Interpretation: no definitive evidence of acute ischemia; does not meet STEMI criteria.    RADIOLOGY I viewed and interpreted the  patient's portable chest x-ray.  See hospital course for details.  No evidence of pneumonia on chest x-ray, but film is limited.  I viewed and interpreted the patient's CT chest/abdomen/pelvis.  See hospital course for details   PROCEDURES:  Critical Care performed: Yes, see critical care procedure note(s)  .Critical Care  Performed by: Loleta Rose, MD Authorized by: Loleta Rose, MD   Critical care provider statement:    Critical care time (minutes):  45   Critical care time was exclusive of:  Separately billable procedures and treating other patients   Critical care was necessary to treat or prevent imminent or life-threatening deterioration of the following conditions:  Sepsis   Critical care was time spent personally by me on the following activities:  Development of treatment plan with patient or surrogate, evaluation of patient's response to treatment, examination of patient, obtaining history from patient or surrogate, ordering and performing treatments and interventions, ordering and review of laboratory studies, ordering and review of radiographic studies, pulse oximetry, re-evaluation of patient's condition and review of old charts .1-3 Lead EKG Interpretation  Performed by: Loleta Rose, MD Authorized by: Loleta Rose, MD     Interpretation: abnormal     ECG rate:  122   ECG rate assessment: tachycardic     Rhythm: atrial fibrillation     Ectopy: none     Conduction: normal       IMPRESSION / MDM / ASSESSMENT AND PLAN / ED COURSE  I reviewed the triage vital signs and the nursing notes.                              Differential diagnosis includes, but is not limited to, sepsis, pneumonia, UTI, cellulitis, less likely osteomyelitis.  Patient's presentation is most consistent with acute presentation with potential threat to life or bodily function.  Labs/studies ordered: I ordered standard sepsis labs including the following: COVID-19 PCR swab, blood cultures  x2, pro time-INR, CMP, urinalysis, urine culture, lactic acid, APTT, CBC with differential, high-sensitivity troponin, lipase, procalcitonin, VBG.  Interventions/Medications given:  Medications  vancomycin (VANCOREADY) IVPB 2000 mg/400 mL (2,000 mg Intravenous New Bag/Given 09/18/22 1030)  lactated ringers bolus 1,000 mL (1,000 mLs Intravenous New Bag/Given 09/18/22 1032)    And  lactated ringers bolus 610 mL (has no administration in time range)  lactated ringers bolus 1,000 mL (0 mLs Intravenous Stopped 09/18/22 1032)  ceFEPIme (MAXIPIME) 2 g in sodium chloride 0.9 % 100 mL IVPB (0 g Intravenous Stopped 09/18/22 0903)  metroNIDAZOLE (FLAGYL) IVPB 500 mg (0 mg Intravenous Stopped 09/18/22 1018)    (Note:  hospital course my include additional interventions and/or labs/studies not listed above.)   Patient is acutely ill and meets criteria for sepsis and I am initiating code sepsis and empiric antibiotics of cefepime 2 g IV, metronidazole 500 mg IV, and vancomycin per pharmacy consult.  I am ordering 1 L of LR to begin fluid resuscitation and he will likely require more as he is currently hypotensive and tachycardic and appears very dry.  I know he has a history of CHF but fluid resuscitation will be necessary.  Patient will require stepdown admission at a minimum.  The patient is on the cardiac monitor to evaluate for evidence of arrhythmia and/or significant heart rate changes.   Clinical Course as of 09/18/22 1042  Sun Sep 18, 2022  0845 DG Chest [facility] I viewed and interpreted the patient's 1 view chest x-ray and I see no evidence of pneumonia although the test is somewhat limited by the patient's body habitus and positioning.  I also read the radiologist's report, which confirmed no acute findings. [CF]  0849 WBC(!): 35.0 Patient's white  blood cell count is 35, but looking back it is typically in the 29 range so this is higher but not dramatically so [CF]  0849 Procalcitonin:  3.13 Substantially elevated procalcitonin suggests or increases the possibility of systemic illness [CF]  0912 Workup thus distant with severe sepsis/septic shock given the patient's hypotension.  Initial lactic acid is still pending but I anticipate it being quite elevated.  Urinalysis is positive.  I ordered them to continue with an indwelling Foley catheter for now given the high probability the patient will need stepdown versus ICU admission with strict I/Os for fluid management purposes.  Comprehensive metabolic panel is consistent with his outpatient labs which are visible in CH[facility] and indicate acute renal failure/AKI.  Electrolytes are surprisingly generally reassuring.  CO2 is decreased at 18.  High-sensitivity troponin is elevated slightly above baseline, possibly demand ischemia, possibly due to his kidney injury.  Given the patient's severe tenderness to palpation of the abdomen, I will perform a noncontrast scan of the chest/abdomen/pelvis to verify no other sources of infection or need for surgical intervention.  I will then contact the admitting service. [CF]  1007 Lactic Acid, Venous(!!): 2.8 [CF]  1010 Although the patient meets criteria for severe sepsis or septic shock based on his initial blood pressure, his blood pressure is now up to 111/91.  He is still tachycardic at about 111.  I completed a sepsis reassessment.  He remains awake and alert.  His peripheral perfusion has improved with his blood pressure improvement.  He is appropriate for stepdown admission.  His significant other/emergency contact is at bedside and I updated her and the patient about the plan.  They both agree with the plan for admission. [CF]  1015 I ordered an additional 1610 mL of IV fluids, which meets a 30 mL/kg IV fluid bolus goal based on his ideal body weight calculation of 87 kg. [CF]  1039 Consulted Dr. Cyndie Chime with the hospitalist service.  We discussed the case and he will admit. [CF]    Clinical Course  User Index [CF] Loleta Rose, MD     FINAL CLINICAL IMPRESSION(S) / ED DIAGNOSES   Final diagnoses:  Severe sepsis (HCC)  Urinary tract infection without hematuria, site unspecified  Pneumonia due to infectious organism, unspecified laterality, unspecified part of lung  Morbid obesity (HCC)  Acute renal failure, unspecified acute renal failure type (HCC)  Pressure injury of skin of contiguous region involving back, buttock, and hip, unspecified injury stage, unspecified laterality  Chronic wound     Rx / DC Orders   ED Discharge Orders     None        Note:  This document was prepared using Dragon voice recognition software and may include unintentional dictation errors.   Loleta Rose, MD 09/18/22 [phone]

## 2022-09-18 NOTE — Progress Notes (Signed)
PHARMACY - PHYSICIAN COMMUNICATION CRITICAL VALUE ALERT - BLOOD CULTURE IDENTIFICATION (BCID)  Daniel Carino. is an 82 y.o. male who presented to [facility] on 09/18/2022 with a chief complaint of sepsis, PNA  Assessment:  Kleb pneumo growing in 1/4 bottles, no resistance.  Positive MRSA screen (include suspected source if known)  Name of physician (or Provider) Contacted: Lindajo Royal, MD   Current antibiotics: Vanc, meropenem   Changes to prescribed antibiotics recommended:  Patient is on recommended antibiotics - No changes needed  Results for orders placed or performed during the hospital encounter of 09/18/22  Blood Culture ID Panel (Reflexed) (Collected: 09/18/2022  8:01 AM)  Result Value Ref Range   Enterococcus faecalis NOT DETECTED NOT DETECTED   Enterococcus Faecium NOT DETECTED NOT DETECTED   Listeria monocytogenes NOT DETECTED NOT DETECTED   Staphylococcus species NOT DETECTED NOT DETECTED   Staphylococcus aureus (BCID) NOT DETECTED NOT DETECTED   Staphylococcus epidermidis NOT DETECTED NOT DETECTED   Staphylococcus lugdunensis NOT DETECTED NOT DETECTED   Streptococcus species NOT DETECTED NOT DETECTED   Streptococcus agalactiae NOT DETECTED NOT DETECTED   Streptococcus pneumoniae NOT DETECTED NOT DETECTED   Streptococcus pyogenes NOT DETECTED NOT DETECTED   A.calcoaceticus-baumannii NOT DETECTED NOT DETECTED   Bacteroides fragilis NOT DETECTED NOT DETECTED   Enterobacterales DETECTED (A) NOT DETECTED   Enterobacter cloacae complex NOT DETECTED NOT DETECTED   Escherichia coli NOT DETECTED NOT DETECTED   Klebsiella aerogenes NOT DETECTED NOT DETECTED   Klebsiella oxytoca NOT DETECTED NOT DETECTED   Klebsiella pneumoniae DETECTED (A) NOT DETECTED   Proteus species NOT DETECTED NOT DETECTED   Salmonella species NOT DETECTED NOT DETECTED   Serratia marcescens NOT DETECTED NOT DETECTED   Haemophilus influenzae NOT DETECTED NOT DETECTED   Neisseria meningitidis NOT  DETECTED NOT DETECTED   Pseudomonas aeruginosa NOT DETECTED NOT DETECTED   Stenotrophomonas maltophilia NOT DETECTED NOT DETECTED   Candida albicans NOT DETECTED NOT DETECTED   Candida auris NOT DETECTED NOT DETECTED   Candida glabrata NOT DETECTED NOT DETECTED   Candida krusei NOT DETECTED NOT DETECTED   Candida parapsilosis NOT DETECTED NOT DETECTED   Candida tropicalis NOT DETECTED NOT DETECTED   Cryptococcus neoformans/gattii NOT DETECTED NOT DETECTED   CTX-M ESBL NOT DETECTED NOT DETECTED   Carbapenem resistance IMP NOT DETECTED NOT DETECTED   Carbapenem resistance KPC NOT DETECTED NOT DETECTED   Carbapenem resistance NDM NOT DETECTED NOT DETECTED   Carbapenem resist OXA 48 LIKE NOT DETECTED NOT DETECTED   Carbapenem resistance VIM NOT DETECTED NOT DETECTED    , D 09/18/2022  10:02 PM

## 2022-09-18 NOTE — Assessment & Plan Note (Signed)
Cont synthroid 

## 2022-09-18 NOTE — Assessment & Plan Note (Signed)
Cont xarelto and amiodarone

## 2022-09-18 NOTE — Assessment & Plan Note (Signed)
EF of 30 to 40% on prior echo.  Severely dilated both ventricles. Clinically appears euvolemic Monitor volume status closely w/ volume resuscitation in the setting of sepsis

## 2022-09-18 NOTE — Assessment & Plan Note (Addendum)
CPAP.  

## 2022-09-18 NOTE — Consult Note (Signed)
PHARMACY -  BRIEF ANTIBIOTIC NOTE   Pharmacy has received consult(s) for cefepime and Vancomycin from an ED provider.  The patient's profile has been reviewed for ht/wt/allergies/indication/available labs.    One time order(s) placed for  cefepime 2gm IV x 1 Vancomycin 2000mg  IV x 1  Further antibiotics/pharmacy consults should be ordered by admitting physician if indicated.                       Thank you,  Rodriguez-Guzman PharmD, BCPS 09/18/2022 8:14 AM

## 2022-09-18 NOTE — Assessment & Plan Note (Signed)
Appears fairly stable from a respiratory standpoint Continue home inhaler regimen

## 2022-09-18 NOTE — Assessment & Plan Note (Signed)
Chronic issue in the setting of multiple comorbidities including morbid obesity

## 2022-09-18 NOTE — ED Notes (Signed)
Conversation had with patient Daniel Eaton, Daniel Eaton) about code status and life saving measures. Despite MOST form stating Attempt CPR, on further conversation and clarification with the patient and Significant other in the room, patient states he DOES NOT want CPR performed in the event that his heart were to stop. He states that he understands the conversation and wants to be "let go" and "No feeding tube" If his heart were to stop, but do attempt IV fluids and antibiotics. Relayed conversation to [place], MD

## 2022-09-18 NOTE — Plan of Care (Signed)

## 2022-09-18 NOTE — Assessment & Plan Note (Signed)
Troponin 63-44 in the setting of sepsis No active chest pain Suspect minimal demand ischemia Continue to trend Continue home regimen Follow

## 2022-09-18 NOTE — Assessment & Plan Note (Signed)
Hypotensive on presentation  Hold BP regimen

## 2022-09-18 NOTE — ED Triage Notes (Signed)
Pt via [facility] from [facility]. Pt here for abnormal labs. WBC was 34. Pt has multiple skin tears and foul smelling urine. Pt is alert and oriented but disoriented to date and time.

## 2022-09-18 NOTE — Assessment & Plan Note (Signed)
Lactate 2.8-3.3 today Appears to be fairly chronic in review of prior labs Otherwise monitor and trend in the setting of sepsis

## 2022-09-18 NOTE — Progress Notes (Addendum)
PHARMACY - PHYSICIAN COMMUNICATION CRITICAL VALUE ALERT - BLOOD CULTURE IDENTIFICATION (BCID)  Daniel Hellums. is an 82 y.o. male who presented to [facility] on 09/18/2022 with a chief complaint of sepsis.  Assessment:  1/4 bottles (aerobic) growing GNR.  BCID: Klebsiella pneumoniae with no resistance genes detected.  Name of physician (or Provider) Contacted: Dr. Para March  Current antibiotics: vancomycin and meropenem  Changes to prescribed antibiotics recommended:  Provider wants no changes to antibiotics at this time.  Results for orders placed or performed during the hospital encounter of 09/18/22  Blood Culture ID Panel (Reflexed) (Collected: 09/18/2022  8:01 AM)  Result Value Ref Range   Enterococcus faecalis NOT DETECTED NOT DETECTED   Enterococcus Faecium NOT DETECTED NOT DETECTED   Listeria monocytogenes NOT DETECTED NOT DETECTED   Staphylococcus species NOT DETECTED NOT DETECTED   Staphylococcus aureus (BCID) NOT DETECTED NOT DETECTED   Staphylococcus epidermidis NOT DETECTED NOT DETECTED   Staphylococcus lugdunensis NOT DETECTED NOT DETECTED   Streptococcus species NOT DETECTED NOT DETECTED   Streptococcus agalactiae NOT DETECTED NOT DETECTED   Streptococcus pneumoniae NOT DETECTED NOT DETECTED   Streptococcus pyogenes NOT DETECTED NOT DETECTED   A.calcoaceticus-baumannii NOT DETECTED NOT DETECTED   Bacteroides fragilis NOT DETECTED NOT DETECTED   Enterobacterales DETECTED (A) NOT DETECTED   Enterobacter cloacae complex NOT DETECTED NOT DETECTED   Escherichia coli NOT DETECTED NOT DETECTED   Klebsiella aerogenes NOT DETECTED NOT DETECTED   Klebsiella oxytoca NOT DETECTED NOT DETECTED   Klebsiella pneumoniae DETECTED (A) NOT DETECTED   Proteus species NOT DETECTED NOT DETECTED   Salmonella species NOT DETECTED NOT DETECTED   Serratia marcescens NOT DETECTED NOT DETECTED   Haemophilus influenzae NOT DETECTED NOT DETECTED   Neisseria meningitidis NOT DETECTED NOT DETECTED    Pseudomonas aeruginosa NOT DETECTED NOT DETECTED   Stenotrophomonas maltophilia NOT DETECTED NOT DETECTED   Candida albicans NOT DETECTED NOT DETECTED   Candida auris NOT DETECTED NOT DETECTED   Candida glabrata NOT DETECTED NOT DETECTED   Candida krusei NOT DETECTED NOT DETECTED   Candida parapsilosis NOT DETECTED NOT DETECTED   Candida tropicalis NOT DETECTED NOT DETECTED   Cryptococcus neoformans/gattii NOT DETECTED NOT DETECTED   CTX-M ESBL NOT DETECTED NOT DETECTED   Carbapenem resistance IMP NOT DETECTED NOT DETECTED   Carbapenem resistance KPC NOT DETECTED NOT DETECTED   Carbapenem resistance NDM NOT DETECTED NOT DETECTED   Carbapenem resist OXA 48 LIKE NOT DETECTED NOT DETECTED   Carbapenem resistance VIM NOT DETECTED NOT DETECTED    Daniel Eaton 09/18/2022  9:33 PM

## 2022-09-18 NOTE — Assessment & Plan Note (Signed)
Meeting severe sepsis criteria with heart rate 100s, initial systolic blood pressure in the 80s, white count 35 Noted right lower lobe pneumonia on CT of the chest as well as urinalysis indicative of infection BP improving with IV resuscitation-monitor volume status with baseline HFrEF Lactate 2.8-->3.3 Recent admission June 23 through July 4 for severe sepsis with noted blood culture growing out ESBL E. Coli, urine culture showing Klebsiella pneumonia  Will place on IV meropenem and vancomycin for infectious coverage Panculture

## 2022-09-18 NOTE — Progress Notes (Signed)
[facility] following for sepsis protocol. 

## 2022-09-18 NOTE — Consult Note (Signed)
CODE SEPSIS - PHARMACY COMMUNICATION  **Broad Spectrum Antibiotics should be administered within 1 hour of Sepsis diagnosis**  Time Code Sepsis Called/Page Received: 0800  Antibiotics Ordered: cefepime and Vancomycin  Time of 1st antibiotic administration: 0824  Additional action taken by pharmacy: none  If necessary, Name of Provider/Nurse Contacted: n/a   Rodriguez-Guzman PharmD, BCPS 09/18/2022 8:13 AM

## 2022-09-18 NOTE — Assessment & Plan Note (Signed)
Noted admission June 24th through July 3 for sepsis and septic shock with noted  blood culture result showing  E. coli and Klebsiella pneumonia ESBL, urine culture showing Klebsiella pneumonia s/p course  IV ertapenem 1 g IV daily for 10 days with end date on 08/20/2022 after discharge.  Empirically covering with meropenem pending repeat cultures in the setting of sepsis Follow

## 2022-09-18 NOTE — H&P (Addendum)
History and Physical    Patient: Daniel Eaton. QVZ:[id number] DOB: [birth year] DOA: 09/18/2022 DOS: the patient was seen and examined on 09/18/2022 PCP: Sherol Dade, DO  Patient coming from: SNF  Chief Complaint:  Chief Complaint  Patient presents with   Code Sepsis   HPI: Daniel Eaton. is a 82 y.o. male with medical history significant of HFrEF(35-40%), hypertension, COPD, A-fib, on Eliquis, PAD, anemia, CKD, morbid obesity, OSA on CPAP and hypothyroidism presenting with severe sepsis.  Limited history as patient is somewhat lethargic, baseline difficulty speaking.  Per report, patient with noted elevated white count at facility.  No reports of fevers chills, nausea or vomiting.  Patient noted to have been admitted June 23 through July 4 for severe sepsis with noted blood cultures growing out ESBL E. coli as well as urine culture growing out Klebsiella.  Completed extended course of IV ertapenem for treatment.  Baseline poor functional status in the setting of severe deconditioning per patient significant other at the bedside.  Is minimally ambulatory.  No reported cough or shortness of breath.  No reported dysuria or increased urinary frequency. Presented to the ER afebrile, heart rate 100s, initial systolic pressures in 80s-improved to the 110s after 2 3 L of LR IV fluid resuscitation.  White count 35, hemoglobin 10.2, platelets 4 1, troponin 60s to 40s.  Lactate 2.8-3.4.  Urinalysis indicative of infection, COVID flu RSV negative.  Creatinine 2.11 with GFR in the 30s.  CT chest abdomen pelvis noted with right lower lobe concerning for pneumonia.  Possible upper lobe pneumonia. Review of Systems: unable to review all systems due to the inability of the patient to answer questions. Past Medical History:  Diagnosis Date   A-fib (HCC)    Cellulitis    CHF (congestive heart failure) (HCC)    COPD (chronic obstructive pulmonary disease) (HCC)    Difficult intubation     Hyperlipidemia    Hypertension    Hypothyroidism    Kidney disease    Sleep apnea    Stroke [facility])    Thyroid disease    Past Surgical History:  Procedure Laterality Date   APPLICATION OF WOUND VAC Left 06/24/2022   Procedure: APPLICATION OF WOUND VAC;  Surgeon: Renford Dills, MD;  Location: [facility] ORS;  Service: Vascular;  Laterality: Left;   APPLICATION OF WOUND VAC  06/28/2022   Procedure: APPLICATION OF WOUND VAC;  Surgeon: Renford Dills, MD;  Location: [facility] OR[facility];  Service: Vascular;;   CARDIAC CATHETERIZATION     CATARACT EXTRACTION W/ INTRAOCULAR LENS  IMPLANT, BILATERAL     COLONOSCOPY  2008   HEMATOMA EVACUATION Left 06/24/2022   Procedure: EVACUATION HEMATOMA;  Surgeon: Renford Dills, MD;  Location: [facility];  Service: Vascular;  Laterality: Left;  wound vac   HERNIA REPAIR     bilateral inguinal hernia/ [place]   HERNIA REPAIR  02/02/2015   18 x 28 cm ventral light mesh placed laparoscopically.   INCISION AND DRAINAGE Left 06/24/2022   Procedure: INCISION AND DRAINAGE;  Surgeon: Renford Dills, MD;  Location: [facility] OR[facility];  Service: Vascular;  Laterality: Left;   IR REMOVAL TUN CV CATH W/O FL  08/26/2022   IR US GUIDE VASC ACCESS LEFT  08/16/2022   TONSILLECTOMY     UMBILICAL HERNIA REPAIR N/A 02/02/2015   Procedure: HERNIA REPAIR UMBILICAL ADULT;  Surgeon: Earline Mayotte, MD;  Location: [facility];  Service: General;  Laterality: N/A;   VENTRAL HERNIA REPAIR N/A  02/02/2015   Procedure: LAPAROSCOPIC VENTRAL HERNIA;  Surgeon: Earline Mayotte, MD;  Location: [facility];  Service: General;  Laterality: N/A;   vp shunt placement  1979   Social History:  reports that he quit smoking about 25 years ago. His smoking use included cigarettes. He started smoking about 66 years ago. He has a 41 pack-year smoking history. He has never used smokeless tobacco. He reports that he does not drink alcohol and does not use drugs.  Allergies  Allergen Reactions   Diltiazem Hcl  Itching and Other (See Comments)    edema    Family History  Problem Relation Age of Onset   Heart disease Mother    Alcohol abuse Father    Bladder Cancer Sister     Prior to Admission medications   Medication Sig Start Date End Date Taking? Authorizing Provider  acetaminophen (TYLENOL) 500 MG tablet Take 500 mg by mouth every 12 (twelve) hours.   Yes [provider]  albuterol (VENTOLIN HFA) 108 (90 Base) MCG/ACT inhaler Inhale 2 puffs into the lungs every 6 (six) hours as needed for wheezing or shortness of breath. 02/17/21  Yes Darlin Priestly, MD  amiodarone (PACERONE) 200 MG tablet Take 200 mg by mouth every morning.   Yes [provider]  Cyanocobalamin 3000 MCG/ML LIQD Place 3,000 mcg under the tongue daily.   Yes [provider]  ferrous gluconate (FERGON) 324 MG tablet Take 324 mg by mouth daily with breakfast.   Yes [provider]  fluticasone (FLONASE) 50 MCG/ACT nasal spray Place 2 sprays into both nostrils daily.   Yes [provider]  Fluticasone-Umeclidin-Vilant (TRELEGY ELLIPTA) 100-62.5-25 MCG/INH AEPB Inhale 1 puff into the lungs daily.   Yes [provider]  guaiFENesin (MUCINEX) 600 MG 12 hr tablet Take 1 tablet (600 mg total) by mouth 2 (two) times daily. 08/17/22  Yes Loyce Dys, MD  HYDROcodone-acetaminophen (NORCO/VICODIN) 5-325 MG tablet Take 1 tablet by mouth every 4 (four) hours as needed for moderate pain. 08/17/22  Yes Loyce Dys, MD  levothyroxine (SYNTHROID) 88 MCG tablet Take 88 mcg by mouth daily.    Yes [provider]  loratadine (CLARITIN) 10 MG tablet Take 10 mg by mouth daily.   Yes [provider]  menthol-zinc oxide (GOLD BOND) powder Apply 1 application  topically daily. (Apply to back)   Yes [provider]  Multiple Vitamin (MULTI-VITAMINS) TABS Take 1 tablet by mouth daily.   Yes [provider]  omeprazole (PRILOSEC) 20 MG capsule Take 20 mg by mouth daily  before supper.   Yes [provider]  polyethylene glycol (MIRALAX / GLYCOLAX) 17 g packet Take 17 g by mouth daily as needed. Patient taking differently: Take 17 g by mouth 2 (two) times daily. 08/03/18  Yes Ojie, Jude, MD  Pramox-PE-Glycerin-Petrolatum (HEMORRHOIDAL) 1-0.25-14.4-15 % CREA Place 1 application  rectally every 6 (six) hours as needed (pain).   Yes [provider]  rivaroxaban (XARELTO) 20 MG TABS tablet Take 20 mg by mouth daily with supper.   Yes [provider]  senna-docusate (SENOKOT-S) 8.6-50 MG tablet Take 1 tablet by mouth 2 (two) times daily.   Yes [provider]  sertraline (ZOLOFT) 50 MG tablet Take 50 mg by mouth at bedtime.   Yes [provider]  spironolactone (ALDACTONE) 25 MG tablet Take 25 mg by mouth daily.   Yes [provider]  traZODone (DESYREL) 50 MG tablet Take 25 mg by mouth at  bedtime.   Yes [provider]  Zinc Oxide 22 % CREA Apply 1 application  topically in the morning, at noon, and at bedtime. (Apply to sacrum and buttocks)   Yes [provider]    Physical Exam: Vitals:   09/18/22 1100 09/18/22 1130 09/18/22 1200 09/18/22 1230  BP: 103/83 (!) 103/92 115/89 118/89  Pulse: (!) 115 (!) 115 (!) 113 (!) 114  Resp: (!) 26 (!) 23 16 19   Temp:      TempSrc:      SpO2: 97% 98% 100% 100%  Weight:      Height:       Physical Exam Constitutional:      Appearance: He is ill-appearing.     Comments: Morbidly obese    HENT:     Head: Normocephalic and atraumatic.     Nose: Nose normal.     Mouth/Throat:     Mouth: Mucous membranes are dry.  Eyes:     Pupils: Pupils are equal, round, and reactive to light.  Cardiovascular:     Rate and Rhythm: Normal rate and regular rhythm.  Pulmonary:     Effort: Pulmonary effort is normal.  Abdominal:     General: Bowel sounds are normal.     Comments: Obese abdomen  Skin:    General: Skin is warm.  Neurological:     General: No  focal deficit present.  Psychiatric:        Mood and Affect: Mood normal.     Data Reviewed:  There are no new results to review at this time. CT CHEST ABDOMEN PELVIS WO CONTRAST CLINICAL DATA:  Sepsis.  Foul-smelling urine.  Multiple skin tears.  EXAM: CT CHEST, ABDOMEN AND PELVIS WITHOUT CONTRAST  TECHNIQUE: Multidetector CT imaging of the chest, abdomen and pelvis was performed following the standard protocol without IV contrast.  RADIATION DOSE REDUCTION: This exam was performed according to the departmental dose-optimization program which includes automated exposure control, adjustment of the mA and/or kV according to patient size and/or use of iterative reconstruction technique.  COMPARISON:  CT AP 08/08/2022 and CT angio chest from 03/01/2016  FINDINGS: CT CHEST FINDINGS  Cardiovascular: Mild cardiac enlargement. Aortic atherosclerosis with coronary artery calcifications. No pericardial effusion.  Mediastinum/Nodes: No enlarged mediastinal, hilar, or axillary lymph nodes. Thyroid gland, trachea, and esophagus demonstrate no significant findings.  Lungs/Pleura: Small bilateral pleural effusions. Overlying compressive type atelectasis. Patchy airspace density within the right lower lung, image 78/4. Scar versus subsegmental atelectasis noted within the basilar right upper lobe. Patchy ground-glass densities are noted within the upper lobes.  Musculoskeletal: No acute or suspicious osseous findings.  CT ABDOMEN PELVIS FINDINGS  Hepatobiliary: No suspicious liver abnormality. Sludge within the dependent portion of the gallbladder. No signs of gallbladder wall inflammation. No bile duct dilatation.  Pancreas: Unremarkable. No pancreatic ductal dilatation or surrounding inflammatory changes.  Spleen: Normal in size without focal abnormality.  Adrenals/Urinary Tract: Normal adrenal glands. Bilateral pelvocaliectasis. Mild left hydroureter. No obstructing  stones identified. The urinary bladder is partially decompressed around a Foley catheter. High density material is noted along the right bladder base, image 103/2 of uncertain significance. This may represent some smalls calcifications, inflammatory debris or contrast material.  Stomach/Bowel: Stomach appears normal. No pathologic dilatation the large or small bowel loops. The appendix is visualized and appears normal. Distal colonic diverticula noted without signs of acute diverticulitis.  Vascular/Lymphatic: Aortic atherosclerosis. No abdominopelvic adenopathy.  Reproductive: Prostate gland is unremarkable.  Other: No free fluid or  fluid collections. No signs of pneumoperitoneum. Bilateral flank subcutaneous edema compatible with anasarca.  Musculoskeletal: Degenerative disc disease identified within the lower lumbar spine. First degree anterolisthesis of L4 on L5. No acute or suspicious osseous findings.  IMPRESSION: 1. Small bilateral pleural effusions with overlying compressive type atelectasis. 2. Patchy airspace density within the right lower lung, which may reflect early pneumonia. 3. Patchy ground-glass densities within the upper lobes, likely infectious or inflammatory. 4. Bilateral pelvocaliectasis with mild left hydroureter. No obstructing stones identified. 5. Body wall edema compatible with anasarca. 6. Aortic Atherosclerosis (ICD10-I70.0).  Electronically Signed   By: Signa Kell M.D.   On: 09/18/2022 09:59 DG Chest Port 1 View CLINICAL DATA:  Sepsis.  EXAM: PORTABLE CHEST 1 VIEW  COMPARISON:  Chest x-ray dated August 08, 2022.  FINDINGS: The patient is rotated to the right. Unchanged cardiomegaly. Normal pulmonary vascularity. No focal consolidation, significant pleural effusion, or pneumothorax. No acute osseous abnormality.  IMPRESSION: No active disease.  Electronically Signed   By: Obie Dredge M.D.   On: 09/18/2022 08:36  Lab  Results  Component Value Date   WBC 35.0 (H) 09/18/2022   HGB 10.2 (L) 09/18/2022   HCT 32.6 (L) 09/18/2022   MCV 94.8 09/18/2022   PLT 401 (H) 09/18/2022   Last metabolic panel Lab Results  Component Value Date   GLUCOSE 79 09/18/2022   NA 136 09/18/2022   K 4.4 09/18/2022   CL 107 09/18/2022   CO2 18 (L) 09/18/2022   BUN 39 (H) 09/18/2022   CREATININE 2.11 (H) 09/18/2022   GFRNONAA 31 (L) 09/18/2022   CALCIUM 8.6 (L) 09/18/2022   PHOS 3.4 08/15/2022   PROT 4.8 (L) 09/18/2022   ALBUMIN 1.5 (L) 09/18/2022   LABGLOB 2.8 06/06/2022   BILITOT 1.1 09/18/2022   ALKPHOS 229 (H) 09/18/2022   AST 56 (H) 09/18/2022   ALT 35 09/18/2022   ANIONGAP 11 09/18/2022    Assessment and Plan: Severe sepsis with septic shock [facility]) Meeting severe sepsis criteria with heart rate 100s, initial systolic blood pressure in the 80s, white count 35 Noted right lower lobe pneumonia on CT of the chest as well as urinalysis indicative of infection BP improving with IV resuscitation-monitor volume status with baseline HFrEF Lactate 2.8-->3.3 Recent admission June 23 through July 4 for severe sepsis with noted blood culture growing out ESBL E. Coli, urine culture showing Klebsiella pneumonia  Will place on IV meropenem and vancomycin for infectious coverage Panculture   Lactic acidosis Lactate 2.8-3.3 today Appears to be fairly chronic in review of prior labs Otherwise monitor and trend in the setting of sepsis  Bacteremia due to Escherichia coli Noted admission June 24th through July 3 for sepsis and septic shock with noted  blood culture result showing  E. coli and Klebsiella pneumonia ESBL, urine culture showing Klebsiella pneumonia s/p course  IV ertapenem 1 g IV daily for 10 days with end date on 08/20/2022 after discharge.  Empirically covering with meropenem pending repeat cultures in the setting of sepsis Follow  Acute renal failure superimposed on chronic kidney disease (HCC) Creatinine  2.1 today with GFR in the 30s Decompensated renal function in the setting of sepsis Monitor renal function with hydration Minimize nephrotoxic agents Follow  Atrial fibrillation (HCC) Cont xarelto and amiodarone    Hypothyroid Cont synthroid    Cardiomyopathy (HCC) EF of 30 to 40% on prior echo.  Severely dilated both ventricles. Clinically appears euvolemic Monitor volume status closely w/ volume resuscitation in the  setting of sepsis  Essential hypertension Hypotensive on presentation  Hold BP regimen    OSA (obstructive sleep apnea) CPAP  Generalized weakness Chronic issue in the setting of multiple comorbidities including morbid obesity  Elevated troponin Troponin 63-44 in the setting of sepsis No active chest pain Suspect minimal demand ischemia Continue to trend Continue home regimen Follow  COPD (chronic obstructive pulmonary disease) (HCC) Appears fairly stable from a respiratory standpoint Continue home inhaler regimen      Advance Care Planning:   Code Status: DNR   Consults: None   Family Communication: Significant other at the bedside   Severity of Illness: The appropriate patient status for this patient is INPATIENT. Inpatient status is judged to be reasonable and necessary in order to provide the required intensity of service to ensure the patient's safety. The patient's presenting symptoms, physical exam findings, and initial radiographic and laboratory data in the context of their chronic comorbidities is felt to place them at high risk for further clinical deterioration. Furthermore, it is not anticipated that the patient will be medically stable for discharge from the hospital within 2 midnights of admission.   * I certify that at the point of admission it is my clinical judgment that the patient will require inpatient hospital care spanning beyond 2 midnights from the point of admission due to high intensity of service, high risk for further  deterioration and high frequency of surveillance required.*  Author: Floydene Flock, MD 09/18/2022 12:49 PM  For on call review [email or web address].

## 2022-09-18 NOTE — Progress Notes (Signed)
Pharmacy Antibiotic Note  Daniel Eaton. is a 82 y.o. male admitted on 09/18/2022 with pneumonia.  Pharmacy has been consulted for vancomycin and meropenem dosing.  Patient presenting from SNF with WBC of 32. In ED, patient is afebrile, tachycardic to 110s and CT of chest shows patchy airspace densities on right side. Patient has PMH of ESBL klebsiella/E coli bacteremia (07/2022).   Plan: Vancomycin load of 2 g IV x1 given in ED (09/17/21 1030) Start vancomycin 750 mg IV every 24 hours (eAUC 508.9, Scr 2.11, Vd 0.5 L/kg) Start meropenem 1 g IV every 12 hours based on current renal function (CrCl 33 mL/min) Monitor renal function, clinical status, and LOT Follow-up culture data/MRSA PCR and de-escalate as able  Height: 5\' 11"  (180.3 cm) Weight: 104.3 kg (230 lb) IBW/kg (Calculated) : 75.3  Temp (24hrs), Avg:98.3 F (36.8 C), Min:98.3 F (36.8 C), Max:98.3 F (36.8 C)  Recent Labs  Lab 09/17/22 2035 09/18/22 0810 09/18/22 1001  WBC 34.4* 35.0*  --   CREATININE 2.16* 2.11*  --   LATICACIDVEN  --  2.8* 3.3*    Estimated Creatinine Clearance: 33.2 mL/min (A) (by C-G formula based on SCr of 2.11 mg/dL (H)).    Allergies  Allergen Reactions   Diltiazem Hcl Itching and Other (See Comments)    edema   Antimicrobials this admission: vancomycin 8/4 >>  Meropenem 8/4 >> cefepime 8/4 x1 Metronidazole 8/4 x1  Dose adjustments this admission:  Microbiology results: 8/4 BCx: pending 8/4 UCx: pending  8/4 RVP: negative 8/4 MRSA PCR: pending  Thank you for involving pharmacy in this patient's care.   Rockwell Alexandria, PharmD Clinical Pharmacist 09/18/2022 12:32 PM

## 2022-09-18 NOTE — ED Notes (Signed)
Advised nurse that patient has ready bed 

## 2022-09-19 DIAGNOSIS — A419 Sepsis, unspecified organism: Secondary | ICD-10-CM | POA: Diagnosis not present

## 2022-09-19 DIAGNOSIS — N390 Urinary tract infection, site not specified: Secondary | ICD-10-CM

## 2022-09-19 DIAGNOSIS — J189 Pneumonia, unspecified organism: Secondary | ICD-10-CM | POA: Diagnosis not present

## 2022-09-19 DIAGNOSIS — E43 Unspecified severe protein-calorie malnutrition: Secondary | ICD-10-CM | POA: Insufficient documentation

## 2022-09-19 DIAGNOSIS — B961 Klebsiella pneumoniae [K. pneumoniae] as the cause of diseases classified elsewhere: Secondary | ICD-10-CM

## 2022-09-19 MED ORDER — ZINC OXIDE 40 % EX OINT
TOPICAL_OINTMENT | Freq: Two times a day (BID) | CUTANEOUS | Status: DC
  Filled 2022-09-19: qty 113

## 2022-09-19 MED ORDER — SODIUM CHLORIDE 0.9 % IV SOLN
2.0000 g | INTRAVENOUS | Status: DC
  Administered 2022-09-20: 2 g via INTRAVENOUS
  Filled 2022-09-19: qty 20

## 2022-09-19 MED ORDER — CHLORHEXIDINE GLUCONATE CLOTH 2 % EX PADS
6.0000 | MEDICATED_PAD | Freq: Every day | CUTANEOUS | Status: DC
  Administered 2022-09-19 – 2022-09-20 (×2): 6 via TOPICAL

## 2022-09-19 MED ORDER — ADULT MULTIVITAMIN W/MINERALS CH
1.0000 | ORAL_TABLET | Freq: Every day | ORAL | Status: DC
  Administered 2022-09-19 – 2022-09-20 (×2): 1 via ORAL
  Filled 2022-09-19 (×2): qty 1

## 2022-09-19 MED ORDER — NEPRO/CARBSTEADY PO LIQD
237.0000 mL | Freq: Three times a day (TID) | ORAL | Status: DC
  Administered 2022-09-19 – 2022-09-20 (×5): 237 mL via ORAL

## 2022-09-19 NOTE — Plan of Care (Signed)

## 2022-09-19 NOTE — Progress Notes (Signed)
Patient states he is not ready to go on CPAP, will attempt again later.

## 2022-09-19 NOTE — Progress Notes (Signed)
Initial Nutrition Assessment  DOCUMENTATION CODES:   Severe malnutrition in context of chronic illness  INTERVENTION:   -Nepro Shake po TID, each supplement provides 425 kcal and 19 grams protein  -MVI with minerals daily -Magic cup TID with meals, each supplement provides 290 kcal and 9 grams of protein  -Feeding assistance with meals -Recommendations discussed with medical team; if aggressive care is warranted and if aligns with goals of care, may need to consider alternative means of nutrition/ hydration  NUTRITION DIAGNOSIS:   Severe Malnutrition related to chronic illness (CHF, COPD) as evidenced by severe fat depletion, mild muscle depletion, moderate muscle depletion, edema.  GOAL:   Patient will meet greater than or equal to 90% of their needs  MONITOR:   PO intake, Supplement acceptance, Diet advancement  REASON FOR ASSESSMENT:   Consult Assessment of nutrition requirement/status, Poor PO  ASSESSMENT:   Pt with medical history significant of HFrEF(35-40%), hypertension, COPD, A-fib, on Eliquis, PAD, anemia, CKD, morbid obesity, OSA on CPAP and hypothyroidism presenting with severe sepsis.  Pt admitted with severe sepsis with septic shock.   8/5- s/p BSE- downgraded to dysphagia 1 diet with nectar thick liquids  Reviewed I/O's: +3.6 L x 24 hours   Case discussed with RN prior to visit. She reports that pt has been declining since last hospitalization approximately 3 months ago. At baseline, pt is alert and able to feed himself, but is currently unable to do so. Pt in a lot of pain and expresses discomfort whenever moved. RN attempted to have pt drink some Nepro, applesauce, and medications with some success, but expresses concern about pt being able to eat enough to sustain himself.   Pt lying in bed at time of visit. Pt familiar to this RD from previous admission. Pt much less interactive and appears more weak from RD's recollection of prior hospitalization. Pt  opened eyes when spoken to and would sometimes speak in garbled speech which this RD was unable to understand. NO family present to provide further history. Noted pt is a resident of [facility].   Noted pt consumed a few sips of Nepro and applesauce.   Reviewed wt hx; pt has experienced a 4.2% wt loss over the past 3 months, which is not significant for time frame. Pt with severe edema, which is likely masking true weight loss as well as fat and muscle depletions.  Palliative care consulted for goals of care, which RD agrees with. Discussed findings, concerns, and recommendations with RN, MD, and palliative care. If agressive care is desired, may need consider alternative means of nutrition/ hydration if this aligns with pt's goals of care.   Medications reviewed and include aldactone and lactated ringers infusion @ 75 ml/hr.   Labs reviewed.    NUTRITION - FOCUSED PHYSICAL EXAM:  Flowsheet Row Most Recent Value  Orbital Region Severe depletion  Upper Arm Region Severe depletion  Thoracic and Lumbar Region No depletion  Buccal Region Mild depletion  Temple Region Moderate depletion  Clavicle Bone Region Moderate depletion  Clavicle and Acromion Bone Region Moderate depletion  Scapular Bone Region Moderate depletion  Dorsal Hand Moderate depletion  Patellar Region No depletion  Anterior Thigh Region No depletion  Posterior Calf Region No depletion  Edema (RD Assessment) Severe  Hair Reviewed  Eyes Reviewed  Mouth Reviewed  Skin Reviewed  Nails Reviewed       Diet Order:   Diet Order  DIET - DYS 1 Room service appropriate? Yes with Assist; Fluid consistency: Nectar Thick  Diet effective now                   EDUCATION NEEDS:   Not appropriate for education at this time  Skin:  Skin Assessment: Reviewed RN Assessment  Last BM:  Unknown  Height:   Ht Readings from Last 1 Encounters:  09/18/22 5\' 11"  (1.803 m)    Weight:   Wt Readings from  Last 1 Encounters:  09/18/22 104.3 kg    Ideal Body Weight:  78.2 kg  BMI:  Body mass index is 32.08 kg/m.  Estimated Nutritional Needs:   Kcal:  2150-2350  Protein:  120-135 grams  Fluid:  >2 L    Levada Schilling, RD, LDN, CDCES Registered Dietitian II Certified Diabetes Care and Education Specialist Please refer to [facility] for RD and/or RD on-call/weekend/after hours pager

## 2022-09-19 NOTE — Consult Note (Signed)
WOC Nurse Consult Note: Reason for Consult: Multiple partial and full thickness non pressure related skin injuries  Wound type: moisture, edema, venous insufficiency, anasarca. Bilateral feet with digit deformity and overlapping, poor hygiene. Left heel with slow-to-blanch, cool and mildly discolored (faint blue) tissues. Numerous comorbid conditions complicate wound repair and regeneration.  Indwelling urinary catheter.  Patient's nurse and another nurse participate in my assessment and in the integumentary POC development as well as a complete bed change, patient turning and repositioning and wound dressing application. Their assistance is appreciated.   ICD-10 CM Codes for Irritant Dermatitis L24A9 - Due to friction or contact with other specified body fluids L30.4  - Erythema intertrigo. Also used for abrasion of the hand, chafing of the skin, dermatitis due to sweating and friction, friction dermatitis, friction eczema, and genital/thigh intertrigo.   Pressure Injury POA: N/A Measurement: Most significant:  LLE (lateral aspect): 10cm x 3.6cm x 0.2cm full thickness with pal pink wound bed and moderate serous exudate LUE (posterior):  6cm x 2cm x 0.1cm and 3cm x 1cm x 0.1cm  Pale pink, wet wound bed Left mid thoracic area:  two wounds that together encompass an 11cm x 11cm x 0.1 area of partial thickness skin loss with pink, moist wound bed Right thoracic area:  10cm x 8cm striated area of bruising, no wound Intragluteal cleft: 2.5cm x 0.5cm x 0.1cm erythema intertrigo Wound bed:As noted above Drainage (amount, consistency, odor) As noted above Periwound: moist edematous extremities Dressing procedure/placement/frequency: I will provide a mattress replacement with low air loss feature to assist with microclimate management. Antimicrobial low coefficient of friction (CoF) bed linens (DermaTherapy) are in use.  Topical are will be conservative and consist of cleansing with NS, then covering  the areas on the extremities with antimicrobial nonadherent (xeroform) gauze topped with ABD pads and securing with Kerlix roll gauze/paper tape. The lesions on the back are to be covered with silicone foams. The buttock, inguinal, scrotal, medial and lateral thigh lesions will be treated with Desitin ointment. Areas of erythema intertrigo at the sub pannicular area will be cleanse and dressed with our house antimicrobial moisture wicking textile, InterDry. Orders for foot care to include cleansing of the interdigital spaces are provided.  Turning and repositioning is in place, I have provided bilateral pressure redistribution heel boots.  WOC nursing team will not follow, but will remain available to this patient, the nursing and medical teams.  Please re-consult if needed.  Thank you for inviting [place] to participate in this patient's Plan of Care.  Ladona Mow, MSN, RN, CNS, GNP, Leda Min, [employer], [employer] phone:  [phone]

## 2022-09-19 NOTE — Progress Notes (Signed)
Progress Note   Patient: Daniel Eaton. ZOX:[id number] DOB: March 11, 1940 DOA: 09/18/2022     1 DOS: the patient was seen and examined on 09/19/2022   Brief Eaton course:   Daniel Eaton. is a 82 y.o. male with medical history significant of HFrEF(35-40%), hypertension, COPD, A-fib, on Eliquis, PAD, anemia, CKD, morbid obesity, OSA on CPAP and hypothyroidism presenting with severe sepsis.  Limited history as patient is somewhat lethargic, baseline difficulty speaking.  Per report, patient with noted elevated white count at facility.  No reports of fevers chills, nausea or vomiting.  Patient noted to have been admitted June 23 through July 4 for severe sepsis with noted blood cultures growing out ESBL E. coli as well as urine culture growing out Klebsiella.  Completed extended course of IV ertapenem for treatment.  Baseline poor functional status in the setting of severe deconditioning per patient significant other at the bedside.  Is minimally ambulatory.  No reported cough or shortness of breath.  No reported dysuria or increased urinary frequency. Presented to the ER afebrile, heart rate 100s, initial systolic pressures in 80s-improved to the 110s after 2 3 L of LR IV fluid resuscitation.  White count 35, hemoglobin 10.2, platelets 4 1, troponin 60s to 40s.  Lactate 2.8-3.4.  Urinalysis indicative of infection, COVID flu RSV negative.  Creatinine 2.11 with GFR in the 30s.  CT chest abdomen pelvis noted with right lower lobe concerning for pneumonia.  Possible upper lobe pneumonia.     Assessment and Plan:  Severe sepsis with septic shock Daniel Eaton) Meeting severe sepsis criteria with heart rate 100s, initial systolic blood pressure in the 80s, white count 35, lactic acidosis and evidence of right lower lobe pneumonia on CT chest as well as pyuria Blood culture yielded Klebsiella pneumoniae as well as urine culture Recent admission June 23 through July 4 for severe sepsis with noted blood culture  growing out ESBL E. Coli and urine culture yielded klebsiella pneumonia  Continue IV meropenem until culture results become available     Klebsiella pneumonia bacteremia Noted admission June 24th through July 3 for sepsis and septic shock with blood culture result showing  E. coli and Klebsiella pneumonia ESBL, urine culture showing Klebsiella pneumonia s/p course  IV ertapenem 1 g IV daily for 10 days with end date on 08/20/2022 after discharge.  Empirically covering with meropenem pending repeat cultures in the setting of sepsis Consult ID   Right lower lobe pneumonia Concerning for possible aspiration Consult speech therapy for swallow function evaluation Aspiration precautions Assist patient with all meals    Acute renal failure superimposed on chronic kidney disease (HCC) Creatinine 2.1 on admission with GFR in the 30s, down to 1.3 Decompensated renal function in the setting of sepsis Monitor renal function with hydration Hold Vancomycin for now    Atrial fibrillation (HCC) Cont xarelto and amiodarone      Hypothyroid Cont synthroid      Cardiomyopathy (HCC) EF of 30 to 40% on prior echo.  Severely dilated both ventricles. Clinically appears euvolemic Monitor volume status closely w/ volume resuscitation in the setting of sepsis    Essential hypertension Hypotensive on presentation  Hold BP meds     OSA (obstructive sleep apnea) CPAP    Generalized weakness Chronic issue in the setting of multiple comorbidities including morbid obesity   Elevated troponin Troponin 63-44 in the setting of sepsis No active chest pain Suspect minimal demand ischemia secondary to sepsis    COPD (chronic  obstructive pulmonary disease) (HCC) Appears fairly stable from a respiratory standpoint Continue home inhaler regimen    Severe malnutrition Secondary to chronic illness Appreciate dietitian input Continue nephro shake, MVI and Magic cup       Subjective: Patient  is seen examined at the bedside.  Physical Exam: Vitals:   09/18/22 1648 09/18/22 1931 09/18/22 2322 09/19/22 1223  BP: (!) 103/51 (!) 95/59 (!) 111/57 109/67  Pulse: (!) 111 (!) 112 (!) 113 (!) 117  Resp:  18 18 18   Temp:  97.7 F (36.5 C) 98.5 F (36.9 C) 98.7 F (37.1 C)  TempSrc:   Axillary   SpO2:  100% 98% 97%  Weight:      Height:       Physical Exam Vitals and nursing note reviewed.  HENT:     Nose: Nose normal.     Mouth/Throat:     Mouth: Mucous membranes are moist.  Eyes:     Comments: Pale conjunctiva  Cardiovascular:     Rate and Rhythm: Tachycardia present.  Pulmonary:     Effort: Pulmonary effort is normal.     Breath sounds: Normal breath sounds.  Abdominal:     Palpations: Abdomen is soft.     Tenderness: There is abdominal tenderness.     Comments: Tender in the right lower quadrant  Musculoskeletal:     Cervical back: Normal range of motion and neck supple.  Skin:    General: Skin is warm and dry.  Neurological:     Mental Status: He is alert.     Motor: Weakness present.  Psychiatric:        Mood and Affect: Mood normal.        Behavior: Behavior normal.     Data Reviewed: Labs reviewed Creatinine 1.93, white count 29.6 down from 35 There are no new results to review at this time.  Family Communication: None  Disposition: Status is: Inpatient Remains inpatient appropriate because: Requires IV antibiotics for sepsis  Planned Discharge Destination: Skilled nursing facility    Time spent: 36 minutes  Author: Lucile Shutters, MD 09/19/2022 1:46 PM  For on call review [email or web address].

## 2022-09-19 NOTE — Evaluation (Signed)
Clinical/Bedside Swallow Evaluation Patient Details  Name: Daniel Eaton. MRN: [id number] Date of Birth: June 07, 1940  Today's Date: 09/19/2022 Time: SLP Start Time (ACUTE ONLY): 0945 SLP Stop Time (ACUTE ONLY): 1045 SLP Time Calculation (min) (ACUTE ONLY): 60 min  Past Medical History:  Past Medical History:  Diagnosis Date   A-fib (HCC)    Cellulitis    CHF (congestive heart failure) (HCC)    COPD (chronic obstructive pulmonary disease) (HCC)    Difficult intubation    Hyperlipidemia    Hypertension    Hypothyroidism    Kidney disease    Sleep apnea    Stroke [facility])    Thyroid disease    Past Surgical History:  Past Surgical History:  Procedure Laterality Date   APPLICATION OF WOUND VAC Left 06/24/2022   Procedure: APPLICATION OF WOUND VAC;  Surgeon: Renford Dills, MD;  Location: [facility] ORS;  Service: Vascular;  Laterality: Left;   APPLICATION OF WOUND VAC  06/28/2022   Procedure: APPLICATION OF WOUND VAC;  Surgeon: Renford Dills, MD;  Location: [facility] OR[facility];  Service: Vascular;;   CARDIAC CATHETERIZATION     CATARACT EXTRACTION W/ INTRAOCULAR LENS  IMPLANT, BILATERAL     COLONOSCOPY  2008   HEMATOMA EVACUATION Left 06/24/2022   Procedure: EVACUATION HEMATOMA;  Surgeon: Renford Dills, MD;  Location: [facility];  Service: Vascular;  Laterality: Left;  wound vac   HERNIA REPAIR     bilateral inguinal hernia/ [place]   HERNIA REPAIR  02/02/2015   18 x 28 cm ventral light mesh placed laparoscopically.   INCISION AND DRAINAGE Left 06/24/2022   Procedure: INCISION AND DRAINAGE;  Surgeon: Renford Dills, MD;  Location: [facility] OR[facility];  Service: Vascular;  Laterality: Left;   IR REMOVAL TUN CV CATH W/O FL  08/26/2022   IR US GUIDE VASC ACCESS LEFT  08/16/2022   TONSILLECTOMY     UMBILICAL HERNIA REPAIR N/A 02/02/2015   Procedure: HERNIA REPAIR UMBILICAL ADULT;  Surgeon: Earline Mayotte, MD;  Location: [facility] OR[facility];  Service: General;  Laterality: N/A;   VENTRAL HERNIA REPAIR N/A  02/02/2015   Procedure: LAPAROSCOPIC VENTRAL HERNIA;  Surgeon: Earline Mayotte, MD;  Location: [facility] OR[facility];  Service: General;  Laterality: N/A;   vp shunt placement  1979   HPI:  Pt is a 82 y.o. male with medical history significant of HFrEF(35-40%), hypertension, COPD, A-fib, on Eliquis, PAD, anemia, CKD, morbid obesity, OSA on CPAP and hypothyroidism presenting with severe sepsis.  Limited history as patient is somewhat lethargic, baseline difficulty speaking.  Per report, patient with noted elevated white count at facility.  No reports of fevers chills, nausea or vomiting.  Patient noted to have been admitted June 23 through July 4 for severe sepsis with noted blood cultures growing out ESBL E. coli as well as urine culture growing out Klebsiella.  Completed extended course of IV ertapenem for treatment.  Baseline poor functional status in the setting of severe deconditioning per patient significant other at the bedside.  Is minimally ambulatory.  No reported cough or shortness of breath.  CT Of CHest: Small bilateral pleural effusions with overlying compressive type  atelectasis.  2. Patchy airspace density within the right lower lung, which may  reflect early pneumonia.  3. Patchy ground-glass densities within the upper lobes, likely  infectious or inflammatory.  4. Body wall edema compatible with anasarca.    Assessment / Plan / Recommendation  Clinical Impression   Pt seen today for BSE. Pt  lying in bed; verbal but w/ decreased intelligibility - severe Xerostomia. Pt also w/ weeping skin and wounds. Needed MAX assistance to sit midline and upright in bed. Unsure of Baseline Cognitive status.  WBC elevated; on RA; afebrile.  Pt appears to present w/ oropharyngeal phase dysphagia w/ oropharyngeal phase deficits and suspected impact from declined Cognitive status(unsure of baseline status?) impacting her overall awareness/engagement during po tasks. ANY Cognitive change/decline can impact  timing/safety of swallowing which increases risk for aspiration/aspiration pneumonia. Pt required MOD tactile/verbal/visual cues for bolus prep and follow through w/ tasks; FULL feeding support.   Pt consumed po trials of Nectar liquids and Purees w/ no overt, clinical s/s of aspiration; no cough and no decline in respiratory presentation noted during/post trials. Suspect potential delayed pharyngeal swallowin initiation and noted decreased hyolaryngeal excursion during the swallows. This could impact the safety and success of pharyngeal swallowing w/ thin liquids. Oral phase was c/b slow, deliberate bolus management, prolonged bolus manipulation for A-P transfer, oral holding, and need for f/u swallow to fully clear orally b/t boluses given. He required increased Time for A-P transfer w/ all trials.  OM exam was cursory d/t pt's reduced follow through and sore mouth to touch, but generalized oral weakness noted w/ No unilateral weakness noted. Poor Dentition status, missing Dentition. Noted 1 lesion on tongue; sore to touch. Extreme Xerostomia. Oral care attempted.   Recommend dysphagia level 1 diet(Puree foods) w/ Nectar consistency liquids; strict aspiration precautions; Pills Crushed in puree for safety; feeding support and Supervision at meals, reduce Distractions during meals and check ofr oral clearing during/post intake. NSG/MD updated.  Recommend Dietician f/u; Palliative Care f/u for GOC discussion d/t overall medical status and need for dysphagia diet now. ST services will continue to monitor pt's status while admitted for further needs. Precautions posted in room. NSG updated, agreed. SLP Visit Diagnosis: Dysphagia, oropharyngeal phase (R13.12)    Aspiration Risk  Moderate aspiration risk;Risk for inadequate nutrition/hydration    Diet Recommendation   Nectar;Dysphagia 1 (puree) - dysphagia level 1 diet(Puree foods) w/ Nectar consistency liquids; strict aspiration precautions; feeding support  and Supervision at meals, reduce Distractions during meals and check ofr oral clearing during/post intake.  Medication Administration: Crushed with puree    Other  Recommendations Recommended Consults:  (Dietician f/u; Palliative Care f/u) Oral Care Recommendations: Oral care BID;Oral care before and after PO;Staff/trained caregiver to provide oral care Caregiver Recommendations: Avoid jello, ice cream, thin soups, popsicles;Remove water pitcher;Have oral suction available    Recommendations for follow up therapy are one component of a multi-disciplinary discharge planning process, led by the attending physician.  Recommendations may be updated based on patient status, additional functional criteria and insurance authorization.  Follow up Recommendations Follow physician's recommendations for discharge plan and follow up therapies      Assistance Recommended at Discharge  FULL  Functional Status Assessment Patient has had a recent decline in their functional status and/or demonstrates limited ability to make significant improvements in function in a reasonable and predictable amount of time  Frequency and Duration min 2x/week  2 weeks       Prognosis Prognosis for improved oropharyngeal function: Guarded Barriers to Reach Goals: Cognitive deficits;Language deficits;Time post onset;Severity of deficits Barriers/Prognosis Comment: unsure of pt's Baseline Cognitive status      Swallow Study   General Date of Onset: 09/18/22 HPI: Pt is a 82 y.o. male with medical history significant of HFrEF(35-40%), hypertension, COPD, A-fib, on Eliquis, PAD, anemia, CKD, morbid obesity, OSA  on CPAP and hypothyroidism presenting with severe sepsis.  Limited history as patient is somewhat lethargic, baseline difficulty speaking.  Per report, patient with noted elevated white count at facility.  No reports of fevers chills, nausea or vomiting.  Patient noted to have been admitted June 23 through July 4 for  severe sepsis with noted blood cultures growing out ESBL E. coli as well as urine culture growing out Klebsiella.  Completed extended course of IV ertapenem for treatment.  Baseline poor functional status in the setting of severe deconditioning per patient significant other at the bedside.  Is minimally ambulatory.  No reported cough or shortness of breath.  CT Of CHest: Small bilateral pleural effusions with overlying compressive type  atelectasis.  2. Patchy airspace density within the right lower lung, which may  reflect early pneumonia.  3. Patchy ground-glass densities within the upper lobes, likely  infectious or inflammatory.  4. Body wall edema compatible with anasarca. Type of Study: Bedside Swallow Evaluation Previous Swallow Assessment: 07/2022: pt exhibited Cognitive/behavioral issues w/ po's - "spoke w/ [facility] and was able to speak with pt's nurse there. She stated that pt was on a regular diet with thin liquids and he was able to feed himself. She further volunteered that pt "chews for the taste and then spits the food out.".  Similar was demonstrated during BSE; no further skilled ST services rec'd then and pt was placed on a Regular diet; thins. Diet Prior to this Study: Dysphagia 1 (pureed);Thin liquids (Level 0) Temperature Spikes Noted: No (wbc 29.6; lactic acid 3.0) Respiratory Status: Room air History of Recent Intubation: No Behavior/Cognition: Alert;Cooperative;Pleasant mood;Confused;Distractible;Requires cueing (unsure of Baseline Cognitive status) Oral Cavity Assessment: Dry;Lesions (sore to touch, per pt report. Noted one lesion on mid-tongue.) Oral Care Completed by SLP: Yes (attempted) Oral Cavity - Dentition: Poor condition;Missing dentition Vision:  (n/a) Self-Feeding Abilities: Total assist Patient Positioning: Upright in bed (needed full positioning) Baseline Vocal Quality: Low vocal intensity Volitional Cough: Cognitively unable to elicit Volitional Swallow:  Unable to elicit    Oral/Motor/Sensory Function Overall Oral Motor/Sensory Function: Generalized oral weakness (no unilateral weakness)   Ice Chips Ice chips: Not tested   Thin Liquid Thin Liquid: Not tested    Nectar Thick Nectar Thick Liquid: Impaired Presentation: Spoon;Straw (3 via tsp; 9 via straw) Oral Phase Impairments:  (functional) Oral phase functional implications: Prolonged oral transit;Oral holding Pharyngeal Phase Impairments: Suspected delayed Swallow;Decreased hyoid-laryngeal movement Other Comments: no overt coughing   Honey Thick Honey Thick Liquid: Not tested   Puree Puree: Impaired Presentation: Spoon (fed; 10 trials) Oral Phase Impairments:  (functional) Oral Phase Functional Implications: Prolonged oral transit;Oral holding Pharyngeal Phase Impairments: Suspected delayed Swallow;Decreased hyoid-laryngeal movement Other Comments: no overt coughing   Solid     Solid: Not tested        Jerilynn Som, MS, CCC-SLP Speech Language Pathologist Rehab Services; [facility] [phone] (ascom) , 09/19/2022,5:01 PM

## 2022-09-19 NOTE — Consult Note (Signed)
NAME: Daniel Eaton.  DOB: Feb 01, 1941  MRN: [id number]  Date/Time: 09/19/2022 12:01 PM  REQUESTING PROVIDER; Dr>Agbata Subjective:  REASON FOR CONSULT: Klebsiella bacteremia Pt is alimited historian- chart reviewed? [name]. is a 82 y.o. with a history of CKD, AFIB was on anticoagulant, CHF, COPD, HTN, CVA, was recently in [facility] between 06/22/22-06/29/22 for anemia and Hb of 5.8, left leg hematoma after striking on wheel chair , underwent evacuation of hematoma and excisional debridement of necrotic tissue and apllication of wound vac- culture positive for MRSA and providencia was sent to SNF on cipro and linezolid and completed a total of 11 days of Rx.  ESBL ecoli bacteremia and klebsiella bacteremia due to foley reatled UTI   08/08/22-08/17/22 and was discharge don ertapenem until 08/20/22 The tuneeled internal jugular cath was removed on 08/26/22 by IR He was brought in because of abnormal labs- wbc of 32- On arrival he was hypotensive, thachycardic and tachypneic  09/18/22 07:57  BP 80/64 (L)  Pulse Rate 126 !  Resp 26 !  SpO2 98 %     Latest Reference Range & Units 09/18/22 08:10  WBC 4.0 - 10.5 K/uL 35.0 (H)  Hemoglobin 13.0 - 17.0 g/dL 87.5 (L)  HCT 64.3 - 32.9 % 32.6 (L)  Platelets 150 - 400 K/uL 401 (H)  Creatinine 0.61 - 1.24 mg/dL 5.18 (H)    Past Medical History:  Diagnosis Date   A-fib (HCC)    Cellulitis    CHF (congestive heart failure) (HCC)    COPD (chronic obstructive pulmonary disease) (HCC)    Difficult intubation    Hyperlipidemia    Hypertension    Hypothyroidism    Kidney disease    Sleep apnea    Stroke [facility])    Thyroid disease     Past Surgical History:  Procedure Laterality Date   APPLICATION OF WOUND VAC Left 06/24/2022   Procedure: APPLICATION OF WOUND VAC;  Surgeon: Renford Dills, MD;  Location: [facility] ORS;  Service: Vascular;  Laterality: Left;   APPLICATION OF WOUND VAC  06/28/2022   Procedure: APPLICATION OF WOUND VAC;  Surgeon:  Renford Dills, MD;  Location: [facility] OR[facility];  Service: Vascular;;   CARDIAC CATHETERIZATION     CATARACT EXTRACTION W/ INTRAOCULAR LENS  IMPLANT, BILATERAL     COLONOSCOPY  2008   HEMATOMA EVACUATION Left 06/24/2022   Procedure: EVACUATION HEMATOMA;  Surgeon: Renford Dills, MD;  Location: [facility] OR[facility];  Service: Vascular;  Laterality: Left;  wound vac   HERNIA REPAIR     bilateral inguinal hernia/ [place]   HERNIA REPAIR  02/02/2015   18 x 28 cm ventral light mesh placed laparoscopically.   INCISION AND DRAINAGE Left 06/24/2022   Procedure: INCISION AND DRAINAGE;  Surgeon: Renford Dills, MD;  Location: [facility] OR[facility];  Service: Vascular;  Laterality: Left;   IR REMOVAL TUN CV CATH W/O FL  08/26/2022   IR US GUIDE VASC ACCESS LEFT  08/16/2022   TONSILLECTOMY     UMBILICAL HERNIA REPAIR N/A 02/02/2015   Procedure: HERNIA REPAIR UMBILICAL ADULT;  Surgeon: Earline Mayotte, MD;  Location: [facility];  Service: General;  Laterality: N/A;   VENTRAL HERNIA REPAIR N/A 02/02/2015   Procedure: LAPAROSCOPIC VENTRAL HERNIA;  Surgeon: Earline Mayotte, MD;  Location: [facility];  Service: General;  Laterality: N/A;   vp shunt placement  1979    Social History   Socioeconomic History   Marital status: Divorced    Spouse name:  Not on file   Number of children: Not on file   Years of education: Not on file   Highest education level: Not on file  Occupational History   Not on file  Tobacco Use   Smoking status: Former    Current packs/day: 0.00    Average packs/day: 1 pack/day for 41.0 years (41.0 ttl pk-yrs)    Types: Cigarettes    Start date: 30    Quit date: 1999    Years since quitting: 25.6   Smokeless tobacco: Never  Vaping Use   Vaping status: Never Used  Substance and Sexual Activity   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   Drug use: No   Sexual activity: Not Currently  Other Topics Concern   Not on file  Social History Narrative   Not on file   Social Determinants of  Health   Financial Resource Strain: Not on file  Food Insecurity: No Food Insecurity (09/18/2022)   Hunger Vital Sign    Worried About Running Out of Food in the Last Year: Never true    Ran Out of Food in the Last Year: Never true  Transportation Needs: No Transportation Needs (09/18/2022)   PRAPARE - Administrator, Civil Service (Medical): No    Lack of Transportation (Non-Medical): No  Physical Activity: Not on file  Stress: Not on file  Social Connections: Not on file  Intimate Partner Violence: Not At Risk (09/18/2022)   Humiliation, Afraid, Rape, and Kick questionnaire    Fear of Current or Ex-Partner: No    Emotionally Abused: No    Physically Abused: No    Sexually Abused: No    Family History  Problem Relation Age of Onset   Heart disease Mother    Alcohol abuse Father    Bladder Cancer Sister    Allergies  Allergen Reactions   Diltiazem Hcl Itching and Other (See Comments)    edema   I? Current Facility-Administered Medications  Medication Dose Route Frequency Provider Last Rate Last Admin   acetaminophen (TYLENOL) tablet 650 mg  650 mg Oral Q6H PRN Floydene Flock, MD   650 mg at 09/19/22 1119   albuterol (PROVENTIL) (2.5 MG/3ML) 0.083% nebulizer solution 2.5 mg  2.5 mg Nebulization Q6H PRN Rockwell Alexandria, RPH       amiodarone (PACERONE) tablet 200 mg  200 mg Oral q morning Floydene Flock, MD   200 mg at 09/19/22 1100   Chlorhexidine Gluconate Cloth 2 % PADS 6 each  6 each Topical Daily Floydene Flock, MD       fluticasone furoate-vilanterol (BREO ELLIPTA) 100-25 MCG/ACT 1 puff  1 puff Inhalation Daily Floydene Flock, MD   1 puff at 09/19/22 1110   And   umeclidinium bromide (INCRUSE ELLIPTA) 62.5 MCG/ACT 1 puff  1 puff Inhalation Daily Floydene Flock, MD   1 puff at 09/19/22 1110   lactated ringers infusion   Intravenous Continuous Floydene Flock, MD 75 mL/hr at 09/19/22 0644 Infusion Verify at 09/19/22 0644   levothyroxine (SYNTHROID) tablet  88 mcg  88 mcg Oral Daily Floydene Flock, MD   88 mcg at 09/19/22 0653   meropenem (MERREM) 1 g in sodium chloride 0.9 % 100 mL IVPB  1 g Intravenous Q12H Rockwell Alexandria, RPH   Stopped at 09/19/22 0236   ondansetron (ZOFRAN) tablet 4 mg  4 mg Oral Q6H PRN Floydene Flock, MD  Or   ondansetron (ZOFRAN) injection 4 mg  4 mg Intravenous Q6H PRN Floydene Flock, MD       pantoprazole (PROTONIX) EC tablet 40 mg  40 mg Oral Daily Floydene Flock, MD   40 mg at 09/19/22 1100   pneumococcal 20-valent conjugate vaccine (PREVNAR 20) injection 0.5 mL  0.5 mL Intramuscular Tomorrow-1000 Floydene Flock, MD       Rivaroxaban Carlena Hurl) tablet 15 mg  15 mg Oral Q supper Floydene Flock, MD   15 mg at 09/18/22 1837   sertraline (ZOLOFT) tablet 50 mg  50 mg Oral QHS Floydene Flock, MD   50 mg at 09/18/22 2136   spironolactone (ALDACTONE) tablet 25 mg  25 mg Oral Daily Floydene Flock, MD   25 mg at 09/19/22 1100     Abtx:  Anti-infectives (From admission, onward)    Start     Dose/Rate Route Frequency Ordered Stop   09/19/22 1030  vancomycin (VANCOREADY) IVPB 750 mg/150 mL  Status:  Discontinued        750 mg 150 mL/hr over 60 Minutes Intravenous Every 24 hours 09/18/22 1235 09/19/22 0901   09/19/22 0100  meropenem (MERREM) 1 g in sodium chloride 0.9 % 100 mL IVPB        1 g 200 mL/hr over 30 Minutes Intravenous Every 12 hours 09/18/22 1235     09/18/22 1215  meropenem (MERREM) 1 g in sodium chloride 0.9 % 100 mL IVPB        1 g 200 mL/hr over 30 Minutes Intravenous  Once 09/18/22 1202 09/18/22 1323   09/18/22 0815  ceFEPIme (MAXIPIME) 2 g in sodium chloride 0.9 % 100 mL IVPB        2 g 200 mL/hr over 30 Minutes Intravenous  Once 09/18/22 0802 09/18/22 0903   09/18/22 0815  metroNIDAZOLE (FLAGYL) IVPB 500 mg        500 mg 100 mL/hr over 60 Minutes Intravenous  Once 09/18/22 0802 09/18/22 1018   09/18/22 0815  vancomycin (VANCOCIN) IVPB 1000 mg/200 mL premix  Status:  Discontinued         1,000 mg 200 mL/hr over 60 Minutes Intravenous  Once 09/18/22 0802 09/18/22 0811   09/18/22 0815  vancomycin (VANCOREADY) IVPB 2000 mg/400 mL        2,000 mg 200 mL/hr over 120 Minutes Intravenous  Once 09/18/22 0811 09/18/22 1239       REVIEW OF SYSTEMS:  Not availble Objective:  VITALS:  BP (!) 111/57 (BP Location: Left Arm)   Pulse (!) 113   Temp 98.5 F (36.9 C) (Axillary)   Resp 18   Ht 5\' 11"  (1.803 m)   Wt 104.3 kg   SpO2 98%   BMI 32.08 kg/m   PHYSICAL EXAM: limited examination due to position of the patient General: Awake ,  no distress, oriented in person, place Head: Normocephalic, without obvious abnormality, atraumatic. Eyes: Conjunctivae clear, anicteric sclerae. Pupils are equal ENT Nares normal. No drainage or sinus tenderness. Lips, mucosa, and tongue normal. No Thrush Neck:  symmetrical, no adenopathy, thyroid: non tender no carotid bruit and no JVD. Lungs: b/l air entry Heart: s1s2 Abdomen: did not examine Extremities: limited examination Skin: multiple bruises Need to examine the back and legs Lymph: Cervical, supraclavicular normal. Neurologic: cannot assess Pertinent Labs Lab Results CBC    Component Value Date/Time   WBC 29.6 (H) 09/19/2022 0342   RBC 3.53 (L) 09/19/2022 0342   HGB  10.3 (L) 09/19/2022 0342   HGB 15.9 06/18/2012 1151   HCT 32.8 (L) 09/19/2022 0342   HCT 47.2 06/18/2012 1151   PLT 384 09/19/2022 0342   PLT 285 06/18/2012 1151   MCV 92.9 09/19/2022 0342   MCV 99 06/18/2012 1151   MCH 29.2 09/19/2022 0342   MCHC 31.4 09/19/2022 0342   RDW 19.4 (H) 09/19/2022 0342   RDW 16.2 (H) 06/18/2012 1151   LYMPHSABS 2.2 09/18/2022 0810   LYMPHSABS 1.7 06/18/2012 1151   MONOABS 0.5 09/18/2022 0810   MONOABS 1.1 (H) 06/18/2012 1151   EOSABS 0.0 09/18/2022 0810   EOSABS 0.0 06/18/2012 1151   BASOSABS 0.2 (H) 09/18/2022 0810   BASOSABS 0.1 06/18/2012 1151       Latest Ref Rng & Units 09/19/2022    3:42 AM 09/18/2022    8:10  AM 09/17/2022    8:35 PM  CMP  Glucose 70 - 99 mg/dL 49  79  80   BUN 8 - 23 mg/dL 37  39  37   Creatinine 0.61 - 1.24 mg/dL 6.57  8.46  9.62   Sodium 135 - 145 mmol/L 137  136  133   Potassium 3.5 - 5.1 mmol/L 4.0  4.4  4.4   Chloride 98 - 111 mmol/L 107  107  109   CO2 22 - 32 mmol/L 16  18  13    Calcium 8.9 - 10.3 mg/dL 8.6  8.6  8.0   Total Protein 6.5 - 8.1 g/dL 4.8  4.8  4.4   Total Bilirubin 0.3 - 1.2 mg/dL 1.2  1.1  0.8   Alkaline Phos 38 - 126 U/L 231  229  251   AST 15 - 41 U/L 33  56  49   ALT 0 - 44 U/L 32  35  34       Microbiology: Recent Results (from the past 240 hour(s))  Blood Culture (routine x 2)     Status: None (Preliminary result)   Collection Time: 09/18/22  8:01 AM   Specimen: BLOOD  Result Value Ref Range Status   Specimen Description BLOOD RIGHT ANTECUBITAL  Final   Special Requests   Final    BOTTLES DRAWN AEROBIC AND ANAEROBIC Blood Culture adequate volume   Culture  Setup Time   Final    Organism ID to follow GRAM NEGATIVE RODS AEROBIC BOTTLE ONLY CRITICAL RESULT CALLED TO, READ BACK BY AND VERIFIED WITH: TREY GREENWOOD ON 09/18/22 AT 2121 QSD Performed at [facility], [address] [place], [place] [address]    Culture GRAM NEGATIVE RODS  Final   Report Status PENDING  Incomplete  Blood Culture ID Panel (Reflexed)     Status: Abnormal   Collection Time: 09/18/22  8:01 AM  Result Value Ref Range Status   Enterococcus faecalis NOT DETECTED NOT DETECTED Final   Enterococcus Faecium NOT DETECTED NOT DETECTED Final   Listeria monocytogenes NOT DETECTED NOT DETECTED Final   Staphylococcus species NOT DETECTED NOT DETECTED Final   Staphylococcus aureus (BCID) NOT DETECTED NOT DETECTED Final   Staphylococcus epidermidis NOT DETECTED NOT DETECTED Final   Staphylococcus lugdunensis NOT DETECTED NOT DETECTED Final   Streptococcus species NOT DETECTED NOT DETECTED Final   Streptococcus agalactiae NOT DETECTED NOT DETECTED Final    Streptococcus pneumoniae NOT DETECTED NOT DETECTED Final   Streptococcus pyogenes NOT DETECTED NOT DETECTED Final   A.calcoaceticus-baumannii NOT DETECTED NOT DETECTED Final   Bacteroides fragilis NOT DETECTED NOT DETECTED Final  Enterobacterales DETECTED (A) NOT DETECTED Final    Comment: Enterobacterales represent a large order of gram negative bacteria, not a single organism. CRITICAL RESULT CALLED TO, READ BACK BY AND VERIFIED WITH: TREY GREENWOOD ON 09/18/22 AT 2121 QSD    Enterobacter cloacae complex NOT DETECTED NOT DETECTED Final   Escherichia coli NOT DETECTED NOT DETECTED Final   Klebsiella aerogenes NOT DETECTED NOT DETECTED Final   Klebsiella oxytoca NOT DETECTED NOT DETECTED Final   Klebsiella pneumoniae DETECTED (A) NOT DETECTED Final    Comment: CRITICAL RESULT CALLED TO, READ BACK BY AND VERIFIED WITH: TREY GREENWOOD ON 09/18/22 AT 2121 QSD    Proteus species NOT DETECTED NOT DETECTED Final   Salmonella species NOT DETECTED NOT DETECTED Final   Serratia marcescens NOT DETECTED NOT DETECTED Final   Haemophilus influenzae NOT DETECTED NOT DETECTED Final   Neisseria meningitidis NOT DETECTED NOT DETECTED Final   Pseudomonas aeruginosa NOT DETECTED NOT DETECTED Final   Stenotrophomonas maltophilia NOT DETECTED NOT DETECTED Final   Candida albicans NOT DETECTED NOT DETECTED Final   Candida auris NOT DETECTED NOT DETECTED Final   Candida glabrata NOT DETECTED NOT DETECTED Final   Candida krusei NOT DETECTED NOT DETECTED Final   Candida parapsilosis NOT DETECTED NOT DETECTED Final   Candida tropicalis NOT DETECTED NOT DETECTED Final   Cryptococcus neoformans/gattii NOT DETECTED NOT DETECTED Final   CTX-M ESBL NOT DETECTED NOT DETECTED Final   Carbapenem resistance IMP NOT DETECTED NOT DETECTED Final   Carbapenem resistance KPC NOT DETECTED NOT DETECTED Final   Carbapenem resistance NDM NOT DETECTED NOT DETECTED Final   Carbapenem resist OXA 48 LIKE NOT DETECTED NOT DETECTED  Final   Carbapenem resistance VIM NOT DETECTED NOT DETECTED Final    Comment: Performed at [facility], [address] [place], [place] [address]  Blood Culture (routine x 2)     Status: None (Preliminary result)   Collection Time: 09/18/22  8:06 AM   Specimen: BLOOD  Result Value Ref Range Status   Specimen Description   Final    BLOOD BLOOD RIGHT ARM Performed at [facility], [address] [place], [place] [address]    Special Requests   Final    BOTTLES DRAWN AEROBIC AND ANAEROBIC Blood Culture adequate volume Performed at [facility], [address] [place], [place] [address]    Culture  Setup Time   Final    GRAM NEGATIVE RODS ANAEROBIC BOTTLE ONLY CRITICAL VALUE NOTED.  VALUE IS CONSISTENT WITH PREVIOUSLY REPORTED AND CALLED VALUE. Performed at [facility], [address], [place], [place] [address]    Culture GRAM NEGATIVE RODS  Final   Report Status PENDING  Incomplete  Resp panel by RT-PCR (RSV, Flu A&B, Covid) Anterior Nasal Swab     Status: None   Collection Time: 09/18/22  8:10 AM   Specimen: Anterior Nasal Swab  Result Value Ref Range Status   SARS Coronavirus 2 by RT PCR NEGATIVE NEGATIVE Final    Comment: (NOTE) SARS-CoV-2 target nucleic acids are NOT DETECTED.  The SARS-CoV-2 RNA is generally detectable in upper respiratory specimens during the acute phase of infection. The lowest concentration of SARS-CoV-2 viral copies this assay can detect is 138 copies/mL. A negative result does not preclude SARS-Cov-2 infection and should not be used as the sole basis for treatment or other patient management decisions. A negative result may occur with  improper specimen collection/handling, submission of specimen other than nasopharyngeal swab, presence of viral  mutation(s) within the areas targeted by this assay, and inadequate number of viral copies(<138 copies/mL). A negative result must be combined with clinical  observations, patient history, and epidemiological information. The expected result is Negative.  Fact Sheet for Patients:  BloggerCourse.com  Fact Sheet for Healthcare Providers:  SeriousBroker.it  This test is no t yet approved or cleared by the [place] FDA and  has been authorized for detection and/or diagnosis of SARS-CoV-2 by FDA under an Emergency Use Authorization (EUA). This EUA will remain  in effect (meaning this test can be used) for the duration of the COVID-19 declaration under Section 564(b)(1) of the Act, 21 U.S.C.section 360bbb-3(b)(1), unless the authorization is terminated  or revoked sooner.       Influenza A by PCR NEGATIVE NEGATIVE Final   Influenza B by PCR NEGATIVE NEGATIVE Final    Comment: (NOTE) The Xpert Xpress SARS-CoV-2/FLU/RSV plus assay is intended as an aid in the diagnosis of influenza from Nasopharyngeal swab specimens and should not be used as a sole basis for treatment. Nasal washings and aspirates are unacceptable for Xpert Xpress SARS-CoV-2/FLU/RSV testing.  Fact Sheet for Patients: BloggerCourse.com  Fact Sheet for Healthcare Providers: SeriousBroker.it  This test is not yet approved or cleared by the [place] FDA and has been authorized for detection and/or diagnosis of SARS-CoV-2 by FDA under an Emergency Use Authorization (EUA). This EUA will remain in effect (meaning this test can be used) for the duration of the COVID-19 declaration under Section 564(b)(1) of the Act, 21 U.S.C. section 360bbb-3(b)(1), unless the authorization is terminated or revoked.     Resp Syncytial Virus by PCR NEGATIVE NEGATIVE Final    Comment: (NOTE) Fact Sheet for Patients: BloggerCourse.com  Fact Sheet for Healthcare Providers: SeriousBroker.it  This test is not yet approved or cleared by  the [place] FDA and has been authorized for detection and/or diagnosis of SARS-CoV-2 by FDA under an Emergency Use Authorization (EUA). This EUA will remain in effect (meaning this test can be used) for the duration of the COVID-19 declaration under Section 564(b)(1) of the Act, 21 U.S.C. section 360bbb-3(b)(1), unless the authorization is terminated or revoked.  Performed at [facility], [address] [place], [place] [address]   Urine Culture     Status: Abnormal (Preliminary result)   Collection Time: 09/18/22  8:40 AM   Specimen: Urine, Random  Result Value Ref Range Status   Specimen Description   Final    URINE, RANDOM Performed at [facility], [address], [place], [place] [address]    Special Requests   Final    NONE Reflexed from [id number] Performed at [facility], [address] [place], [place] [address]    Culture (A)  Final    >=100,000 COLONIES/mL KLEBSIELLA PNEUMONIAE SUSCEPTIBILITIES TO FOLLOW Performed at [facility], [address] [place], [place] [address]    Report Status PENDING  Incomplete  MRSA Next Gen by PCR, Nasal     Status: Abnormal   Collection Time: 09/18/22  3:08 PM   Specimen: Nasal Mucosa; Nasal Swab  Result Value Ref Range Status   MRSA by PCR Next Gen DETECTED (A) NOT DETECTED Final    Comment: CRITICAL RESULT CALLED TO, READ BACK BY AND VERIFIED WITH: Felecia Jan RN @ 1734 09/18/22 [facility] (NOTE) The GeneXpert MRSA Assay (FDA approved for NASAL specimens only), is one component of a comprehensive MRSA colonization surveillance program. It is not intended to diagnose MRSA infection nor  to guide or monitor treatment for MRSA infections. Test performance is not FDA approved in patients less than 67 years old. Performed at [facility], [address] [place], [place] [address]     IMAGING RESULTS: CT chest     B/l lower lobe infiltrate VS atelectasis  CT abdomen Bilateral  pelvocaliectasis. Mild left hydroureter. No obstructing stones identified. The urinary bladder is partially decompressed around a Foley catheter. High density material is noted along the right bladder base, iThis may represent some smalls calcifications, inflammatory debris or contrast material. I have personally reviewed the films ? Impression/Recommendation ? Sepsis secondary to klebsiella bacteremia with klebsiella UTI Recently had the same bacteremia in June 2024 Still has leucocytosis Recommend Urology opinion as CT abdomen shows debris in bladder ? Stone Left hydroureter Has foley Can change meropenem to ceftriaxone as no ESBl infeciton  B/l lower lobe infiltrate vs atelectasis /pleural effusion MRSA nares   AKI - improving    Afib on amiodarone and  xarelto  Recent Esbl Ecoli and klebsiella bacteremia  Cardiomyopathy Demand ischemia  OSA  COPD  ? ___________________________________________________  Note:  This document was prepared using Dragon voice recognition software and may include unintentional dictation errors.

## 2022-09-19 NOTE — Plan of Care (Signed)
PMT consult noted for GOC noted. In to see patient. He states he is too thirsty to talk to me at this time, and would like to talk at another time. Nurse alerted to his request. Will follow up tomorrow.

## 2022-09-19 NOTE — Plan of Care (Signed)
  Problem: Fluid Volume: Goal: Hemodynamic stability will improve Outcome: Progressing   Problem: Clinical Measurements: Goal: Diagnostic test results will improve Outcome: Progressing Goal: Signs and symptoms of infection will decrease Outcome: Progressing   Problem: Respiratory: Goal: Ability to maintain adequate ventilation will improve Outcome: Progressing   Problem: Clinical Measurements: Goal: Ability to maintain clinical measurements within normal limits will improve Outcome: Progressing Goal: Will remain free from infection Outcome: Progressing Goal: Diagnostic test results will improve Outcome: Progressing Goal: Respiratory complications will improve Outcome: Progressing Goal: Cardiovascular complication will be avoided Outcome: Progressing   Problem: Activity: Goal: Risk for activity intolerance will decrease Outcome: Progressing   Problem: Nutrition: Goal: Adequate nutrition will be maintained Outcome: Progressing   Problem: Coping: Goal: Level of anxiety will decrease Outcome: Progressing   Problem: Elimination: Goal: Will not experience complications related to bowel motility Outcome: Progressing Goal: Will not experience complications related to urinary retention Outcome: Progressing   Problem: Pain Managment: Goal: General experience of comfort will improve Outcome: Progressing   Problem: Safety: Goal: Ability to remain free from injury will improve Outcome: Progressing   Problem: Skin Integrity: Goal: Risk for impaired skin integrity will decrease Outcome: Progressing   Problem: Education: Goal: Knowledge of General Education information will improve Description: Including pain rating scale, medication(s)/side effects and non-pharmacologic comfort measures Outcome: Not Progressing Note: Patient experiencing confusion at this time. Will continue to reorient and educate as needed.   Problem: Health Behavior/Discharge Planning: Goal: Ability  to manage health-related needs will improve Outcome: Not Progressing Note: Patient experiencing confusion at this time. Will continue to reorient and educate as needed.

## 2022-09-20 DIAGNOSIS — Z7189 Other specified counseling: Secondary | ICD-10-CM

## 2022-09-20 DIAGNOSIS — A419 Sepsis, unspecified organism: Secondary | ICD-10-CM | POA: Diagnosis not present

## 2022-09-20 DIAGNOSIS — N170 Acute kidney failure with tubular necrosis: Secondary | ICD-10-CM

## 2022-09-20 DIAGNOSIS — R6520 Severe sepsis without septic shock: Secondary | ICD-10-CM

## 2022-09-20 LAB — CBC WITH DIFFERENTIAL/PLATELET
Abs Immature Granulocytes: 1.63 10*3/uL — ABNORMAL HIGH (ref 0.00–0.07)
Basophils Absolute: 0.1 10*3/uL (ref 0.0–0.1)
Basophils Relative: 0 %
Eosinophils Absolute: 0.1 10*3/uL (ref 0.0–0.5)
Eosinophils Relative: 0 %
HCT: 28.8 % — ABNORMAL LOW (ref 39.0–52.0)
Hemoglobin: 9 g/dL — ABNORMAL LOW (ref 13.0–17.0)
Immature Granulocytes: 6 %
Lymphocytes Relative: 4 %
Lymphs Abs: 1.2 10*3/uL (ref 0.7–4.0)
MCH: 29.6 pg (ref 26.0–34.0)
MCHC: 31.3 g/dL (ref 30.0–36.0)
MCV: 94.7 fL (ref 80.0–100.0)
Monocytes Absolute: 0.5 10*3/uL (ref 0.1–1.0)
Monocytes Relative: 2 %
Neutro Abs: 24.5 10*3/uL — ABNORMAL HIGH (ref 1.7–7.7)
Neutrophils Relative %: 88 %
Platelets: 331 10*3/uL (ref 150–400)
RBC: 3.04 MIL/uL — ABNORMAL LOW (ref 4.22–5.81)
RDW: 19.7 % — ABNORMAL HIGH (ref 11.5–15.5)
Smear Review: NORMAL
WBC: 27.9 10*3/uL — ABNORMAL HIGH (ref 4.0–10.5)
nRBC: 0.3 % — ABNORMAL HIGH (ref 0.0–0.2)
nRBC: 0.3 % — ABNORMAL HIGH (ref 0.0–0.2)

## 2022-09-20 LAB — BASIC METABOLIC PANEL
Anion gap: 11 (ref 5–15)
BUN: 37 mg/dL — ABNORMAL HIGH (ref 8–23)
CO2: 16 mmol/L — ABNORMAL LOW (ref 22–32)
Calcium: 8 mg/dL — ABNORMAL LOW (ref 8.9–10.3)
Chloride: 109 mmol/L (ref 98–111)
Creatinine, Ser: 1.88 mg/dL — ABNORMAL HIGH (ref 0.61–1.24)
GFR, Estimated: 35 mL/min — ABNORMAL LOW (ref >60)
Glucose, Bld: 78 mg/dL (ref 70–99)
Potassium: 3.7 mmol/L (ref 3.5–5.1)
Sodium: 136 mmol/L (ref 135–145)

## 2022-09-20 MED ORDER — SODIUM CHLORIDE 0.9 % IV SOLN: INTRAVENOUS | Status: DC | PRN

## 2022-09-20 MED ORDER — MORPHINE SULFATE (PF) 2 MG/ML IV SOLN
2.0000 mg | INTRAVENOUS | Status: DC | PRN
  Administered 2022-09-20 – 2022-09-21 (×4): 2 mg via INTRAVENOUS
  Filled 2022-09-20 (×4): qty 1

## 2022-09-20 NOTE — Progress Notes (Signed)
Chap responded to page. Pt in the room with fam and NP. Paper work filled. Now Chap stepped out to look for a Notary to facilitate completion on Adv. Dir with no success. Chap Call Mother baby and Administration No Notary available for now. Mirna Mires will continue seeking.   09/20/22 1400  Spiritual Encounters  Type of Visit Initial  Care provided to: Pt and family  Conversation partners present during encounter Social worker/Care management/TOC  Referral source Nurse (RN/NT/LPN)  Reason for visit Advance directives  OnCall Visit No  Spiritual Framework  Presenting Themes Impactful experiences and emotions

## 2022-09-20 NOTE — Progress Notes (Signed)
Chaplain paged and notified to help prepare advanced directive forms per Crystal NP

## 2022-09-20 NOTE — Progress Notes (Addendum)
Daily Progress Note   Patient Name: Daniel Eaton.       Date: 09/20/2022 DOB: Oct 16, 1940  Age: 82 y.o. MRN#: [id number] Attending Physician: Lucile Shutters, MD Primary Care Physician: Sherol Dade, DO Admit Date: 09/18/2022  Reason for Consultation/Follow-up: Establishing goals of care  Subjective: Notes and labs reviewed.  In to see patient. He is currently resting in bed with [name] at bedside.  Patient is able to tell me his name that we are at [facility], the year is 2024, the president is President Biden.  He states he does not feel well and has not in quite a while.   We discussed his diagnoses, prognosis, GOC, EOL wishes disposition and options.  Created space and opportunity for patient  to explore thoughts and feelings regarding current medical information.   A detailed discussion was had today regarding advanced directives.  Concepts specific to code status, artifical feeding and hydration, IV antibiotics and rehospitalization were discussed.  The difference between an aggressive medical intervention path and a comfort care path was discussed.  Values and goals of care important to patient and family were attempted to be elicited.  Discussed limitations of medical interventions to prolong quality of life in some situations and discussed the concept of human mortality.  Patient states he would never want CPR or to be put on life support.  He states he would not want a feeding tube.  He understands his levels of lnourishment and how this is affected his body.  He states he does not want thickened liquids and wants to drink regular thin liquids.  He states he does not want to return to the hospital.  Want to be kept comfortable and to go to hospice facility if he is  able to.  He states he just wants to be kept comfortable with no further life-prolonging care until death.  He asks how he would be transported to hospice, and was told an ambulance would take him.   [name] is in agreement with this and called patient's son Thayer Ohm who voices agreement with a plan for comfort focused care.  Patient states he would want [name] to be his primary healthcare power of attorney, and would want his son Thayer Ohm to be his secondary power of attorney. Chaplain paged to complete this. Family has asked for  placement at [facility] hospice facility.  I completed a MOST form today with the patient.  With assistance he was able to lift his arm and was able to sign form.  [name] was at bedside.  The signed original was placed in the chart. The form was scanned and sent to medical records for it to be uploaded under ACP tab in Epic. A photocopy was also placed in the chart to be scanned into EMR. The patient outlined their wishes for the following treatment decisions:  Cardiopulmonary Resuscitation: Do Not Attempt Resuscitation (DNR/No CPR)  Medical Interventions: Comfort Measures: Keep clean, warm, and dry. Use medication by any route, positioning, wound care, and other measures to relieve pain and suffering. Use oxygen, suction and manual treatment of airway obstruction as needed for comfort. Do not transfer to the hospital unless comfort needs cannot be met in current location.  Antibiotics: No antibiotics (use other measures to relieve symptoms)  IV Fluids: No IV fluids (provide other measures to ensure comfort)  Feeding Tube: No feeding tube      Length of Stay: 2  Current Medications: Scheduled Meds:   amiodarone  200 mg Oral q morning   Chlorhexidine Gluconate Cloth  6 each Topical Daily   feeding supplement (NEPRO CARB STEADY)  237 mL Oral TID BM   fluticasone furoate-vilanterol  1 puff Inhalation Daily   And   umeclidinium bromide  1 puff Inhalation Daily    levothyroxine  88 mcg Oral Daily   liver oil-zinc oxide   Topical BID   multivitamin with minerals  1 tablet Oral Daily   pantoprazole  40 mg Oral Daily   pneumococcal 20-valent conjugate vaccine  0.5 mL Intramuscular Tomorrow-1000   Rivaroxaban  15 mg Oral Q supper   sertraline  50 mg Oral QHS   spironolactone  25 mg Oral Daily    Continuous Infusions:  sodium chloride 5 mL/hr at 09/20/22 0605    PRN Meds: sodium chloride, acetaminophen, albuterol, ondansetron **OR** ondansetron (ZOFRAN) IV  Physical Exam Pulmonary:     Effort: Pulmonary effort is normal.  Skin:    General: Skin is warm and dry.     Comments: Extremities are edematous and bruised with dressings in place due to weeping.  Neurological:     Mental Status: He is alert.             Vital Signs: BP (!) 106/57 (BP Location: Left Arm)   Pulse (!) 101   Temp 97.6 F (36.4 C)   Resp 17   Ht 5\' 11"  (1.803 m)   Wt 104.3 kg   SpO2 96%   BMI 32.08 kg/m  SpO2: SpO2: 96 % O2 Device: O2 Device: Room Air O2 Flow Rate:    Intake/output summary:  Intake/Output Summary (Last 24 hours) at 09/20/2022 1528 Last data filed at 09/20/2022 1004 Gross per 24 hour  Intake 1857.74 ml  Output 900 ml  Net 957.74 ml   LBM: Last BM Date :  (patient unable to remember) Baseline Weight: Weight: 104.3 kg Most recent weight: Weight: 104.3 kg    Patient Active Problem List   Diagnosis Date Noted   Protein-calorie malnutrition, severe 09/19/2022   Complicated UTI (urinary tract infection) 08/11/2022   Hypotension 08/10/2022   Bacteremia due to Escherichia coli 08/10/2022   Bacteremia due to Klebsiella pneumoniae 08/10/2022   Infection due to extended spectrum beta lactamase (ESBL) producing bacteria 08/10/2022   Septic shock (HCC) 08/09/2022   PAD (peripheral artery disease) (HCC) 08/09/2022  Severe sepsis with septic shock (HCC) 08/08/2022   Vitamin B12 deficiency 07/07/2022   Leg hematoma, left, initial encounter  06/22/2022   Lactic acidosis 06/22/2022   Macrocytic anemia 06/06/2022   Cellulitis of left lower extremity 02/14/2021   COPD (chronic obstructive pulmonary disease) (HCC)    Stroke (HCC)    Acute renal failure superimposed on stage 3a chronic kidney disease (HCC)    Elevated troponin    Normocytic anemia    Generalized weakness    Acute renal failure superimposed on chronic kidney disease (HCC)    Myalgia due to statin 12/18/2019   Atrial fibrillation (HCC) 04/09/2019   Essential hypertension 04/09/2019   Venous stasis dermatitis of both lower extremities 04/09/2019   Lower limb ulcer, ankle, right, limited to breakdown of skin (HCC) 04/09/2019   Rhabdomyolysis 12/10/2018   Impairment of balance 10/02/2018   Physical deconditioning 10/02/2018   Sepsis (HCC) 07/28/2018   Primary osteoarthritis of right knee 09/22/2017   Healthcare maintenance 03/16/2017   Aortic atherosclerosis (HCC) 03/15/2016   Cough 03/20/2015   Bronchitis, chronic obstructive w acute bronchitis (HCC) 03/20/2015   Umbilical hernia without obstruction and without gangrene 01/12/2015   Ventral hernia without obstruction or gangrene 01/02/2015   Morbid obesity with BMI of 45.0-49.9, adult (HCC) 11/23/2013   Achalasia 06/30/2013   Cardiomyopathy (HCC) 06/30/2013   OSA (obstructive sleep apnea) 06/30/2013   Hypothyroid 06/30/2013    Palliative Care Assessment & Plan     Recommendations/Plan: Patient would like comfort focused care with hospice facility placement if possible.  Code Status:    Code Status Orders  (From admission, onward)           Start     Ordered   09/18/22 1220  Do not attempt resuscitation (DNR)  Continuous       Question Answer Comment  If patient has no pulse and is not breathing Do Not Attempt Resuscitation   If patient has a pulse and/or is breathing: Medical Treatment Goals LIMITED ADDITIONAL INTERVENTIONS: Use medication/IV fluids and cardiac monitoring as indicated; Do  not use intubation or mechanical ventilation (DNI), also provide comfort medications.  Transfer to Progressive/Stepdown as indicated, avoid Intensive Care.   Consent: Discussion documented in EHR or advanced directives reviewed      09/18/22 1222           Code Status History     Date Active Date Inactive Code Status Order ID Comments User Context   08/08/2022 2010 08/17/2022 2354 Full Code [id number]  Gertha Calkin, MD ED   06/29/2022 1250 06/29/2022 2007 DNR [id number]  Joylene Draft, NP Inpatient   06/22/2022 2127 06/29/2022 1250 Full Code [id number]  Gertha Calkin, MD ED   02/14/2021 0851 02/18/2021 1630 Full Code [id number]  Lorretta Harp, MD ED   12/10/2018 2238 12/14/2018 2230 Full Code [id number]  Houston Siren, MD Inpatient   07/31/2018 1149 08/03/2018 1609 Full Code [id number]  Jama Flavors, MD Inpatient   07/28/2018 1109 07/31/2018 1149 DNR [id number]  Jama Flavors, MD ED      Advance Directive Documentation    Flowsheet Row Most Recent Value  Type of Advance Directive Out of facility DNR (pink MOST or yellow form)  Pre-existing out of facility DNR order (yellow form or pink MOST form) --  "MOST" Form in Place? --       Prognosis: Poor   Care plan was discussed with attending and TOC via epic chat  Thank you for allowing the Palliative  Medicine Team to assist in the care of this patient.    Morton Stall, NP  Please contact Palliative Medicine Team phone at [phone] for questions and concerns.

## 2022-09-20 NOTE — Progress Notes (Signed)
Patient was being fed when I went to bedside to place on cpap

## 2022-09-20 NOTE — Progress Notes (Addendum)
   [facility]- Civil engineer, contracting   Received request from Transitions of Care Manger for family interest in Hospice Home.  Eligibility is pending.   Spoke with patient's significant other, [name],  to confirm interest and explain services. HL will follow-up tomorrow for further evaluation.    Please call for any Hospice related questions or concerns.    Thank you for the opportunity to participate in this patient's care.    Redge Gainer,  [facility] Liaison [phone]

## 2022-09-20 NOTE — Progress Notes (Signed)
Nutrition Brief Note  Chart reviewed. Pt now transitioning to comfort care.  No further nutrition interventions planned at this time.  Please re-consult as needed.    W, RD, LDN, CDCES Registered Dietitian II Certified Diabetes Care and Education Specialist Please refer to [facility] for RD and/or RD on-call/weekend/after hours pager   

## 2022-09-20 NOTE — Progress Notes (Signed)
   09/20/22 1600  Spiritual Encounters  Type of Visit Follow up  Care provided to: Pt and family  Conversation partners present during encounter Social worker/Care management/TOC  Referral source Nurse (RN/NT/LPN)  Reason for visit Advance directives  OnCall Visit No   Mirna Mires made yet another good attempt to get a Notary with out success. Notary says they cannot assist as they do not have the time. Chap explained the challenge to both Pt and Fam and also to [employer]. Now referring this to the incoming [facility] for further assistance.

## 2022-09-20 NOTE — Progress Notes (Signed)
Progress Note   Patient: Daniel Eaton. ZOX:[id number] DOB: [birth year] DOA: 09/18/2022     2 DOS: the patient was seen and examined on 09/20/2022   Brief hospital course:   Daniel Eaton. is a 82 y.o. male with medical history significant of HFrEF(35-40%), hypertension, COPD, A-fib, on Eliquis, PAD, anemia, CKD, morbid obesity, OSA on CPAP and hypothyroidism presenting with severe sepsis.  Limited history as patient is somewhat lethargic, baseline difficulty speaking.  Per report, patient with noted elevated white count at facility.  No reports of fevers chills, nausea or vomiting.  Patient noted to have been admitted June 23 through July 4 for severe sepsis with noted blood cultures growing out ESBL E. coli as well as urine culture growing out Klebsiella.  Completed extended course of IV ertapenem for treatment.  Baseline poor functional status in the setting of severe deconditioning per patient significant other at the bedside.  Is minimally ambulatory.  No reported cough or shortness of breath.  No reported dysuria or increased urinary frequency. Presented to the ER afebrile, heart rate 100s, initial systolic pressures in 80s-improved to the 110s after 2 3 L of LR IV fluid resuscitation.  White count 35, hemoglobin 10.2, platelets 4 1, troponin 60s to 40s.  Lactate 2.8-3.4.  Urinalysis indicative of infection, COVID flu RSV negative.  Creatinine 2.11 with GFR in the 30s.  CT chest abdomen pelvis noted with right lower lobe concerning for pneumonia.  Possible upper lobe pneumonia.      Assessment and Plan:  Severe sepsis with septic shock (HCC) Klebsiella pneumonia bacteremia Meeting severe sepsis criteria with heart rate 100s, initial systolic blood pressure in the 80s, white count 35, lactic acidosis and evidence of right lower lobe pneumonia on CT chest as well as pyuria Blood and urine culture yielded Klebsiella pneumoniae.  Sensitivity pending Recent admission June 23 through July 4 for  severe sepsis with noted blood culture growing out ESBL E. Coli and urine culture yielded klebsiella pneumonia  Appreciate ID consult, IV ceftriaxone was recommended. Leukocytosis (but shows a downward trend, 29.6 >> 27.9 Discussed with patient's wife who does not want any further aggressive therapy and wants patient placed on comfort measures.      Right lower lobe pneumonia Concerning for possible aspiration Appreciate speech therapy consult, she recommends a pured diet with nectar thick liquids. Strict aspiration precautions Assist patient with all meals     Acute renal failure superimposed on chronic kidney disease (HCC) Creatinine 2.1 on admission with GFR in the 30s, down to 1.8 Decompensated renal function in the setting of sepsis Monitor renal function with hydration Hold Vancomycin for now     Atrial fibrillation (HCC) Cont xarelto and amiodarone      Hypothyroid Cont synthroid      Cardiomyopathy (HCC) EF of 30 to 40% on prior echo.  Severely dilated both ventricles. Clinically appears euvolemic Monitor volume status closely w/ volume resuscitation in the setting of sepsis     Essential hypertension Hypotensive on presentation  Hold BP meds     OSA (obstructive sleep apnea) CPAP     Generalized weakness Chronic issue in the setting of multiple comorbidities including morbid obesity   Elevated troponin Troponin 63-44 in the setting of sepsis No active chest pain Suspect minimal demand ischemia secondary to sepsis     COPD (chronic obstructive pulmonary disease) (HCC) Appears fairly stable from a respiratory standpoint Continue home inhaler regimen       Severe malnutrition Secondary  to chronic illness Appreciate dietitian input Continue nephro shake, MVI and Magic cup           Subjective: No new complaints.  Being assisted with breakfast by nursing staff  Physical Exam: Vitals:   09/20/22 0042 09/20/22 0413 09/20/22 0756 09/20/22  1149  BP:  137/88 104/76 (!) 106/57  Pulse: (!) 105 (!) 106 100 (!) 101  Resp:  20 16 17   Temp:  97.8 F (36.6 C) (!) 97.5 F (36.4 C) 97.6 F (36.4 C)  TempSrc:      SpO2: 100% 98% 98% 96%  Weight:      Height:       Vitals and nursing note reviewed.  HENT:     Nose: Nose normal.     Mouth/Throat:     Mouth: Mucous membranes are moist.  Eyes:     Comments: Pale conjunctiva  Cardiovascular:     Rate and Rhythm: Tachycardia present.  Pulmonary:     Effort: Pulmonary effort is normal.     Breath sounds: Normal breath sounds.  Abdominal:     Palpations: Abdomen is soft.     Tenderness: There is abdominal tenderness.     Comments: Tender in the right lower quadrant  Musculoskeletal:     Cervical back: Normal range of motion and neck supple.  Skin:    General: Skin is warm and dry.  Neurological:     Mental Status: He is alert.     Motor: Weakness present.  Psychiatric:        Mood and Affect: Mood normal.        Behavior: Behavior normal.   Data Reviewed: Labs reviewed. Bicarbonate 16, BUN 37, creatinine 1.8, white count 27.9, hemoglobin 9.0, hematocrit 28.8 There are no new results to review at this time.  Family Communication: Had an extensive discussion with patient's wife Daniel Eaton at the bedside.  Patient's wife does not want any further aggressive treatments and wishes for patient to be placed on comfort measures.  She states that she spoke with palliative care and patient's children also spoke with the palliative care nurse and are all in agreement.  Disposition: Status is: Inpatient Remains inpatient appropriate because: Comfort measures  Planned Discharge Destination:  TBD    Time spent: 38 minutes  Author: Lucile Shutters, MD 09/20/2022 2:40 PM  For on call review [email or web address].

## 2022-09-21 DIAGNOSIS — A419 Sepsis, unspecified organism: Secondary | ICD-10-CM | POA: Diagnosis not present

## 2022-09-21 DIAGNOSIS — R6521 Severe sepsis with septic shock: Secondary | ICD-10-CM

## 2022-09-21 DIAGNOSIS — Z7189 Other specified counseling: Secondary | ICD-10-CM | POA: Diagnosis not present

## 2022-09-21 MED ORDER — ONDANSETRON 4 MG PO TBDP: 4.0000 mg | ORAL_TABLET | Freq: Four times a day (QID) | ORAL | Status: DC | PRN

## 2022-09-21 MED ORDER — ACETAMINOPHEN 325 MG PO TABS: 650.0000 mg | ORAL_TABLET | Freq: Four times a day (QID) | ORAL | Status: DC | PRN

## 2022-09-21 MED ORDER — GLYCOPYRROLATE 0.2 MG/ML IJ SOLN: 0.2000 mg | INTRAMUSCULAR | Status: DC | PRN

## 2022-09-21 MED ORDER — LORAZEPAM 1 MG PO TABS: 1.0000 mg | ORAL_TABLET | ORAL | 0 refills | Status: AC | PRN

## 2022-09-21 MED ORDER — ACETAMINOPHEN 650 MG RE SUPP: 650.0000 mg | Freq: Four times a day (QID) | RECTAL | Status: DC | PRN

## 2022-09-21 MED ORDER — GLYCOPYRROLATE 1 MG PO TABS: 1.0000 mg | ORAL_TABLET | ORAL | 0 refills | Status: AC | PRN

## 2022-09-21 MED ORDER — HALOPERIDOL LACTATE 5 MG/ML IJ SOLN
0.5000 mg | INTRAMUSCULAR | Status: DC | PRN
  Administered 2022-09-21: 0.5 mg via INTRAVENOUS
  Filled 2022-09-21: qty 1

## 2022-09-21 MED ORDER — BIOTENE DRY MOUTH MT LIQD: 15.0000 mL | OROMUCOSAL | Status: DC | PRN

## 2022-09-21 MED ORDER — HALOPERIDOL 0.5 MG PO TABS: 0.5000 mg | ORAL_TABLET | ORAL | Status: DC | PRN

## 2022-09-21 MED ORDER — ONDANSETRON HCL 4 MG/2ML IJ SOLN: 4.0000 mg | Freq: Four times a day (QID) | INTRAMUSCULAR | Status: DC | PRN

## 2022-09-21 MED ORDER — POLYVINYL ALCOHOL 1.4 % OP SOLN: 1.0000 [drp] | Freq: Four times a day (QID) | OPHTHALMIC | Status: DC | PRN

## 2022-09-21 MED ORDER — HALOPERIDOL LACTATE 2 MG/ML PO CONC: 0.5000 mg | ORAL | Status: DC | PRN

## 2022-09-21 MED ORDER — MORPHINE SULFATE (CONCENTRATE) 10 MG/0.5ML PO SOLN: 20.0000 mg | ORAL | Status: DC | PRN

## 2022-09-21 MED ORDER — MORPHINE SULFATE (CONCENTRATE) 10 MG/0.5ML PO SOLN: 20.0000 mg | ORAL | 0 refills | Status: AC | PRN

## 2022-09-21 MED ORDER — ALBUTEROL SULFATE (2.5 MG/3ML) 0.083% IN NEBU: 2.5000 mg | INHALATION_SOLUTION | Freq: Four times a day (QID) | RESPIRATORY_TRACT | 12 refills | Status: DC | PRN

## 2022-09-21 MED ORDER — GLYCOPYRROLATE 1 MG PO TABS: 1.0000 mg | ORAL_TABLET | ORAL | Status: DC | PRN

## 2022-09-21 MED ORDER — ACETAMINOPHEN 650 MG RE SUPP: 650.0000 mg | Freq: Four times a day (QID) | RECTAL | 0 refills | Status: DC | PRN

## 2022-09-21 NOTE — Plan of Care (Signed)
  Problem: Fluid Volume: Goal: Hemodynamic stability will improve Outcome: Progressing   Problem: Clinical Measurements: Goal: Signs and symptoms of infection will decrease Outcome: Progressing   Problem: Respiratory: Goal: Ability to maintain adequate ventilation will improve Outcome: Progressing   Problem: Health Behavior/Discharge Planning: Goal: Ability to manage health-related needs will improve Outcome: Not Progressing

## 2022-09-21 NOTE — Discharge Summary (Signed)
Physician Discharge Summary   Patient: Daniel Eaton. MRN: [id number] DOB: 1940-07-26  Admit date:     09/18/2022  Discharge date: 09/21/22  Discharge Physician:     PCP: Daniel Dade, DO   Recommendations at discharge:   Patient is being discharged back to the skilled nursing facility with comfort measures  Discharge Diagnoses: Principal Problem:   Severe sepsis with septic shock (HCC) Active Problems:   Bacteremia due to Escherichia coli   Atrial fibrillation (HCC)   Acute renal failure superimposed on chronic kidney disease (HCC)   Hypothyroid   Cardiomyopathy (HCC)   Essential hypertension   COPD (chronic obstructive pulmonary disease) (HCC)   Elevated troponin   Generalized weakness   Protein-calorie malnutrition, severe  Resolved Problems:   * No resolved hospital problems. *  Hospital Course:   Daniel Eaton. is a 82 y.o. male with medical history significant of HFrEF(35-40%), hypertension, COPD, A-fib, on Eliquis, PAD, anemia, CKD, morbid obesity, OSA on CPAP and hypothyroidism presenting with severe sepsis.  Limited history as patient is somewhat lethargic, baseline difficulty speaking.  Per report, patient with noted elevated white count at facility.  No reports of fevers chills, nausea or vomiting.  Patient noted to have been admitted June 23 through July 4 for severe sepsis with noted blood cultures growing out ESBL E. coli as well as urine culture growing out Klebsiella.  Completed extended course of IV ertapenem for treatment.  Baseline poor functional status in the setting of severe deconditioning per patient significant other at the bedside.  Is minimally ambulatory.  No reported cough or shortness of breath.  No reported dysuria or increased urinary frequency. Presented to the ER afebrile, heart rate 100s, initial systolic pressures in 80s-improved to the 110s after 2 3 L of LR IV fluid resuscitation.  White count 35, hemoglobin 10.2,  platelets 4 1, troponin 60s to 40s.  Lactate 2.8-3.4.  Urinalysis indicative of infection, COVID flu RSV negative.  Creatinine 2.11 with GFR in the 30s.  CT chest abdomen pelvis noted with right lower lobe concerning for pneumonia.  Possible upper lobe pneumonia. Review of Systems: unable to review all systems due to the inability of the patient to answer questions.     Assessment and Plan:  Patient admitted to the hospital for severe sepsis with shock from Klebsiella pneumonia bacteremia from a right lower lobe pneumonia with complications of an acute kidney injury.  Patient has multiple comorbidities which include severe cardiomyopathy with an LVEF of 30 to 40%, COPD and severe malnutrition. He was recently discharged from the hospital after treatment for ESBL E. coli and Klebsiella bacteremia due to Foley related UTI.  He was discharged back to skilled nursing facility on IV ertapenem. Patient was seen by palliative care during this hospitalization and his wife has requested that patient be placed on comfort measures and on aggressive treatments were discontinued. Patient will be discharged back to the skilled nursing facility with hospice.         Consultants: Infectious disease, palliative care, Procedures performed: None  Disposition: Skilled nursing facility Diet recommendation:  Discharge Diet Orders (From admission, onward)     Start     Ordered   09/21/22 0000  Diet - low sodium heart healthy. Dys 1   09/21/22 1340           Dysphagia type 1 nectar thick Liquid DISCHARGE MEDICATION: Allergies as of 09/21/2022       Reactions   Diltiazem Hcl Itching,  Other (See Comments)   edema        Medication List     STOP taking these medications    albuterol 108 (90 Base) MCG/ACT inhaler Commonly known as: VENTOLIN HFA Replaced by: albuterol (2.5 MG/3ML) 0.083% nebulizer solution   amiodarone 200 MG tablet Commonly known as: PACERONE   Cyanocobalamin 3000 MCG/ML  Liqd   ferrous gluconate 324 MG tablet Commonly known as: FERGON   fluticasone 50 MCG/ACT nasal spray Commonly known as: FLONASE   guaiFENesin 600 MG 12 hr tablet Commonly known as: MUCINEX   Hemorrhoidal 1-0.25-14.4-15 % Crea Generic drug: Pramox-PE-Glycerin-Petrolatum   HYDROcodone-acetaminophen 5-325 MG tablet Commonly known as: NORCO/VICODIN   levothyroxine 88 MCG tablet Commonly known as: SYNTHROID   loratadine 10 MG tablet Commonly known as: CLARITIN   menthol-zinc oxide powder   Multi-Vitamins Tabs   omeprazole 20 MG capsule Commonly known as: PRILOSEC   polyethylene glycol 17 g packet Commonly known as: MIRALAX / GLYCOLAX   rivaroxaban 20 MG Tabs tablet Commonly known as: XARELTO   senna-docusate 8.6-50 MG tablet Commonly known as: Senokot-S   sertraline 50 MG tablet Commonly known as: ZOLOFT   spironolactone 25 MG tablet Commonly known as: ALDACTONE   traZODone 50 MG tablet Commonly known as: DESYREL   Trelegy Ellipta 100-62.5-25 MCG/INH Aepb Generic drug: Fluticasone-Umeclidin-Vilant   Zinc Oxide 22 % Crea       TAKE these medications    acetaminophen 500 MG tablet Commonly known as: TYLENOL Take 500 mg by mouth every 12 (twelve) hours. What changed: Another medication with the same name was added. Make sure you understand how and when to take each.   acetaminophen 650 MG suppository Commonly known as: TYLENOL Place 1 suppository (650 mg total) rectally every 6 (six) hours as needed for mild pain (or Fever >/= 101). What changed: You were already taking a medication with the same name, and this prescription was added. Make sure you understand how and when to take each.   albuterol (2.5 MG/3ML) 0.083% nebulizer solution Commonly known as: PROVENTIL Take 3 mLs (2.5 mg total) by nebulization every 6 (six) hours as needed for wheezing or shortness of breath. Replaces: albuterol 108 (90 Base) MCG/ACT inhaler   glycopyrrolate 1 MG  tablet Commonly known as: ROBINUL Take 1 tablet (1 mg total) by mouth every 4 (four) hours as needed for up to 10 days (excessive secretions).   LORazepam 1 MG tablet Commonly known as: ATIVAN Take 1 tablet (1 mg total) by mouth every 4 (four) hours as needed for up to 10 days for anxiety.   morphine CONCENTRATE 10 MG/0.5ML Soln concentrated solution Take 1 mL (20 mg total) by mouth every 2 (two) hours as needed for up to 5 days for severe pain, anxiety or shortness of breath.               Discharge Care Instructions  (From admission, onward)           Start     Ordered   09/21/22 0000  Change dressing (specify)       Comments: Wound care  Daily      Comments: Wound care to scattered areas of partial and full thickness non pressure related skin loss on the extremities (left arm and leg > right arm and leg):  Cleanse with NS, pat dry, cover with xeroform gauze, top with ABD pad, secure with a few turns of Kerlix roll gauze/paper tape. Change daily and PRN drainage strikethrough.  Wound care  Daily      Comments: Wound care to left mid thoracic partial thickness skin loss, right thoracic ecchymotic area:  Cleanse with NS, pat dry. Cover with silicone foam dressing. Change daily and PRN drainage strikethrough.   09/21/22 1340            Discharge Exam: Filed Weights   09/18/22 0755  Weight: 104.3 kg   HENT:     Nose: Nose normal.     Mouth/Throat:     Mouth: Mucous membranes are moist.  Eyes:     Comments: Pale conjunctiva  Cardiovascular:     Rate and Rhythm: Tachycardia present.  Pulmonary:     Effort: Pulmonary effort is normal.     Breath sounds: Normal breath sounds.  Abdominal:     Palpations: Abdomen is soft.     Tenderness: There is abdominal tenderness.     Comments: Tender in the right lower quadrant  Musculoskeletal:     Cervical back: Normal range of motion and neck supple.  Skin:    General: Skin is warm and dry.  Neurological:      Mental Status: He is alert.     Motor: Weakness present.  Psychiatric:        Mood and Affect: Mood normal.        Behavior: Behavior normal.   Condition at discharge: poor  The results of significant diagnostics from this hospitalization (including imaging, microbiology, ancillary and laboratory) are listed below for reference.   Imaging Studies: CT CHEST ABDOMEN PELVIS WO CONTRAST  Result Date: 09/18/2022 CLINICAL DATA:  Sepsis.  Foul-smelling urine.  Multiple skin tears. EXAM: CT CHEST, ABDOMEN AND PELVIS WITHOUT CONTRAST TECHNIQUE: Multidetector CT imaging of the chest, abdomen and pelvis was performed following the standard protocol without IV contrast. RADIATION DOSE REDUCTION: This exam was performed according to the departmental dose-optimization program which includes automated exposure control, adjustment of the mA and/or kV according to patient size and/or use of iterative reconstruction technique. COMPARISON:  CT AP 08/08/2022 and CT angio chest from 03/01/2016 FINDINGS: CT CHEST FINDINGS Cardiovascular: Mild cardiac enlargement. Aortic atherosclerosis with coronary artery calcifications. No pericardial effusion. Mediastinum/Nodes: No enlarged mediastinal, hilar, or axillary lymph nodes. Thyroid gland, trachea, and esophagus demonstrate no significant findings. Lungs/Pleura: Small bilateral pleural effusions. Overlying compressive type atelectasis. Patchy airspace density within the right lower lung, image 78/4. Scar versus subsegmental atelectasis noted within the basilar right upper lobe. Patchy ground-glass densities are noted within the upper lobes. Musculoskeletal: No acute or suspicious osseous findings. CT ABDOMEN PELVIS FINDINGS Hepatobiliary: No suspicious liver abnormality. Sludge within the dependent portion of the gallbladder. No signs of gallbladder wall inflammation. No bile duct dilatation. Pancreas: Unremarkable. No pancreatic ductal dilatation or surrounding inflammatory  changes. Spleen: Normal in size without focal abnormality. Adrenals/Urinary Tract: Normal adrenal glands. Bilateral pelvocaliectasis. Mild left hydroureter. No obstructing stones identified. The urinary bladder is partially decompressed around a Foley catheter. High density material is noted along the right bladder base, image 103/2 of uncertain significance. This may represent some smalls calcifications, inflammatory debris or contrast material. Stomach/Bowel: Stomach appears normal. No pathologic dilatation the large or small bowel loops. The appendix is visualized and appears normal. Distal colonic diverticula noted without signs of acute diverticulitis. Vascular/Lymphatic: Aortic atherosclerosis. No abdominopelvic adenopathy. Reproductive: Prostate gland is unremarkable. Other: No free fluid or fluid collections. No signs of pneumoperitoneum. Bilateral flank subcutaneous edema compatible with anasarca. Musculoskeletal: Degenerative disc disease identified within the lower lumbar spine. First degree  anterolisthesis of L4 on L5. No acute or suspicious osseous findings. IMPRESSION: 1. Small bilateral pleural effusions with overlying compressive type atelectasis. 2. Patchy airspace density within the right lower lung, which may reflect early pneumonia. 3. Patchy ground-glass densities within the upper lobes, likely infectious or inflammatory. 4. Bilateral pelvocaliectasis with mild left hydroureter. No obstructing stones identified. 5. Body wall edema compatible with anasarca. 6. Aortic Atherosclerosis (ICD10-I70.0). Electronically Signed   By: Signa Kell M.D.   On: 09/18/2022 09:59   DG Chest Port 1 View  Result Date: 09/18/2022 CLINICAL DATA:  Sepsis. EXAM: PORTABLE CHEST 1 VIEW COMPARISON:  Chest x-ray dated August 08, 2022. FINDINGS: The patient is rotated to the right. Unchanged cardiomegaly. Normal pulmonary vascularity. No focal consolidation, significant pleural effusion, or pneumothorax. No acute  osseous abnormality. IMPRESSION: No active disease. Electronically Signed   By: Obie Dredge M.D.   On: 09/18/2022 08:36   IR Removal Tun Cv Cath W/O FL  Result Date: 08/26/2022 INDICATION: Patient with chronic kidney disease requiring hemodialysis. Interventional radiology placed a tunneled dialysis catheter 08/16/2022. Patient no longer requiring hemodialysis. Interventional radiology asked to remove tunneled dialysis catheter. EXAM: REMOVAL TUNNELED CENTRAL VENOUS CATHETER MEDICATIONS: None ANESTHESIA/SEDATION: None FLUOROSCOPY: None COMPLICATIONS: None immediate. PROCEDURE: Informed written consent was obtained from the patient after a thorough discussion of the procedural risks, benefits and alternatives. All questions were addressed. Maximal Sterile Barrier Technique was utilized including caps, mask, sterile gowns, sterile gloves, sterile drape, hand hygiene and skin antiseptic. A timeout was performed prior to the initiation of the procedure. The patient's right chest and catheter was prepped and draped in a normal sterile fashion. Using gentle manual traction the cuff of the catheter was exposed and the catheter was removed in it's entirety. Pressure was held till hemostasis was obtained. A sterile dressing was applied. The patient tolerated the procedure well with no immediate complications. IMPRESSION: Successful catheter removal as described above. Procedure performed by Alwyn Ren NP and supervised by Dr. Juliette Alcide. Electronically Signed   By: Olive Bass M.D.   On: 08/26/2022 14:03    Microbiology: Results for orders placed or performed during the hospital encounter of 09/18/22  Blood Culture (routine x 2)     Status: Abnormal   Collection Time: 09/18/22  8:01 AM   Specimen: BLOOD  Result Value Ref Range Status   Specimen Description   Final    BLOOD RIGHT ANTECUBITAL Performed at [facility] [address] [place], [place] [address]    Special Requests   Final     BOTTLES DRAWN AEROBIC AND ANAEROBIC Blood Culture adequate volume Performed at [facility], [address] [place], [place] [address]    Culture  Setup Time   Final    Organism ID to follow GRAM NEGATIVE RODS AEROBIC BOTTLE ONLY CRITICAL RESULT CALLED TO, READ BACK BY AND VERIFIED WITH: TREY GREENWOOD ON 09/18/22 AT 2121 QSD Performed at [facility], [address], [place], [place] [address]    Culture KLEBSIELLA PNEUMONIAE (A)  Final   Report Status 09/21/2022 FINAL  Final   Organism ID, Bacteria KLEBSIELLA PNEUMONIAE  Final      Susceptibility   Klebsiella pneumoniae - MIC*    AMPICILLIN >=32 RESISTANT Resistant     CEFEPIME <=0.12 SENSITIVE Sensitive     CEFTAZIDIME <=1 SENSITIVE Sensitive     CEFTRIAXONE <=0.25 SENSITIVE Sensitive     CIPROFLOXACIN 1 RESISTANT Resistant     GENTAMICIN <=1 SENSITIVE Sensitive  IMIPENEM <=0.25 SENSITIVE Sensitive     TRIMETH/SULFA <=20 SENSITIVE Sensitive     AMPICILLIN/SULBACTAM >=32 RESISTANT Resistant     PIP/TAZO 32 INTERMEDIATE Intermediate     * KLEBSIELLA PNEUMONIAE  Blood Culture ID Panel (Reflexed)     Status: Abnormal   Collection Time: 09/18/22  8:01 AM  Result Value Ref Range Status   Enterococcus faecalis NOT DETECTED NOT DETECTED Final   Enterococcus Faecium NOT DETECTED NOT DETECTED Final   Listeria monocytogenes NOT DETECTED NOT DETECTED Final   Staphylococcus species NOT DETECTED NOT DETECTED Final   Staphylococcus aureus (BCID) NOT DETECTED NOT DETECTED Final   Staphylococcus epidermidis NOT DETECTED NOT DETECTED Final   Staphylococcus lugdunensis NOT DETECTED NOT DETECTED Final   Streptococcus species NOT DETECTED NOT DETECTED Final   Streptococcus agalactiae NOT DETECTED NOT DETECTED Final   Streptococcus pneumoniae NOT DETECTED NOT DETECTED Final   Streptococcus pyogenes NOT DETECTED NOT DETECTED Final   A.calcoaceticus-baumannii NOT DETECTED NOT DETECTED Final   Bacteroides fragilis NOT  DETECTED NOT DETECTED Final   Enterobacterales DETECTED (A) NOT DETECTED Final    Comment: Enterobacterales represent a large order of gram negative bacteria, not a single organism. CRITICAL RESULT CALLED TO, READ BACK BY AND VERIFIED WITH: TREY GREENWOOD ON 09/18/22 AT 2121 QSD    Enterobacter cloacae complex NOT DETECTED NOT DETECTED Final   Escherichia coli NOT DETECTED NOT DETECTED Final   Klebsiella aerogenes NOT DETECTED NOT DETECTED Final   Klebsiella oxytoca NOT DETECTED NOT DETECTED Final   Klebsiella pneumoniae DETECTED (A) NOT DETECTED Final    Comment: CRITICAL RESULT CALLED TO, READ BACK BY AND VERIFIED WITH: TREY GREENWOOD ON 09/18/22 AT 2121 QSD    Proteus species NOT DETECTED NOT DETECTED Final   Salmonella species NOT DETECTED NOT DETECTED Final   Serratia marcescens NOT DETECTED NOT DETECTED Final   Haemophilus influenzae NOT DETECTED NOT DETECTED Final   Neisseria meningitidis NOT DETECTED NOT DETECTED Final   Pseudomonas aeruginosa NOT DETECTED NOT DETECTED Final   Stenotrophomonas maltophilia NOT DETECTED NOT DETECTED Final   Candida albicans NOT DETECTED NOT DETECTED Final   Candida auris NOT DETECTED NOT DETECTED Final   Candida glabrata NOT DETECTED NOT DETECTED Final   Candida krusei NOT DETECTED NOT DETECTED Final   Candida parapsilosis NOT DETECTED NOT DETECTED Final   Candida tropicalis NOT DETECTED NOT DETECTED Final   Cryptococcus neoformans/gattii NOT DETECTED NOT DETECTED Final   CTX-M ESBL NOT DETECTED NOT DETECTED Final   Carbapenem resistance IMP NOT DETECTED NOT DETECTED Final   Carbapenem resistance KPC NOT DETECTED NOT DETECTED Final   Carbapenem resistance NDM NOT DETECTED NOT DETECTED Final   Carbapenem resist OXA 48 LIKE NOT DETECTED NOT DETECTED Final   Carbapenem resistance VIM NOT DETECTED NOT DETECTED Final    Comment: Performed at [facility], [address] [place], [place] [address]  Blood Culture (routine x 2)     Status:  Abnormal   Collection Time: 09/18/22  8:06 AM   Specimen: BLOOD  Result Value Ref Range Status   Specimen Description   Final    BLOOD BLOOD RIGHT ARM Performed at [facility], [address], [place], [place] [address]    Special Requests   Final    BOTTLES DRAWN AEROBIC AND ANAEROBIC Blood Culture adequate volume Performed at [facility], [address], [place], [place] [address]    Culture  Setup Time   Final    GRAM NEGATIVE RODS ANAEROBIC BOTTLE  ONLY CRITICAL VALUE NOTED.  VALUE IS CONSISTENT WITH PREVIOUSLY REPORTED AND CALLED VALUE.    Culture (A)  Final    KLEBSIELLA PNEUMONIAE SUSCEPTIBILITIES PERFORMED ON PREVIOUS CULTURE WITHIN THE LAST 5 DAYS. Performed at [facility], [address], [place], [place] [address]    Report Status 09/21/2022 FINAL  Final  Resp panel by RT-PCR (RSV, Flu A&B, Covid) Anterior Nasal Swab     Status: None   Collection Time: 09/18/22  8:10 AM   Specimen: Anterior Nasal Swab  Result Value Ref Range Status   SARS Coronavirus 2 by RT PCR NEGATIVE NEGATIVE Final    Comment: (NOTE) SARS-CoV-2 target nucleic acids are NOT DETECTED.  The SARS-CoV-2 RNA is generally detectable in upper respiratory specimens during the acute phase of infection. The lowest concentration of SARS-CoV-2 viral copies this assay can detect is 138 copies/mL. A negative result does not preclude SARS-Cov-2 infection and should not be used as the sole basis for treatment or other patient management decisions. A negative result may occur with  improper specimen collection/handling, submission of specimen other than nasopharyngeal swab, presence of viral mutation(s) within the areas targeted by this assay, and inadequate number of viral copies(<138 copies/mL). A negative result must be combined with clinical observations, patient history, and epidemiological information. The expected result is Negative.  Fact Sheet for Patients:   BloggerCourse.com  Fact Sheet for Healthcare Providers:  SeriousBroker.it  This test is no t yet approved or cleared by the [place] FDA and  has been authorized for detection and/or diagnosis of SARS-CoV-2 by FDA under an Emergency Use Authorization (EUA). This EUA will remain  in effect (meaning this test can be used) for the duration of the COVID-19 declaration under Section 564(b)(1) of the Act, 21 U.S.C.section 360bbb-3(b)(1), unless the authorization is terminated  or revoked sooner.       Influenza A by PCR NEGATIVE NEGATIVE Final   Influenza B by PCR NEGATIVE NEGATIVE Final    Comment: (NOTE) The Xpert Xpress SARS-CoV-2/FLU/RSV plus assay is intended as an aid in the diagnosis of influenza from Nasopharyngeal swab specimens and should not be used as a sole basis for treatment. Nasal washings and aspirates are unacceptable for Xpert Xpress SARS-CoV-2/FLU/RSV testing.  Fact Sheet for Patients: BloggerCourse.com  Fact Sheet for Healthcare Providers: SeriousBroker.it  This test is not yet approved or cleared by the [place] FDA and has been authorized for detection and/or diagnosis of SARS-CoV-2 by FDA under an Emergency Use Authorization (EUA). This EUA will remain in effect (meaning this test can be used) for the duration of the COVID-19 declaration under Section 564(b)(1) of the Act, 21 U.S.C. section 360bbb-3(b)(1), unless the authorization is terminated or revoked.     Resp Syncytial Virus by PCR NEGATIVE NEGATIVE Final    Comment: (NOTE) Fact Sheet for Patients: BloggerCourse.com  Fact Sheet for Healthcare Providers: SeriousBroker.it  This test is not yet approved or cleared by the [place] FDA and has been authorized for detection and/or diagnosis of SARS-CoV-2 by FDA under an Emergency Use  Authorization (EUA). This EUA will remain in effect (meaning this test can be used) for the duration of the COVID-19 declaration under Section 564(b)(1) of the Act, 21 U.S.C. section 360bbb-3(b)(1), unless the authorization is terminated or revoked.  Performed at [facility], [address] [place], [place] [address]   Urine Culture     Status: Abnormal   Collection Time: 09/18/22  8:40 AM   Specimen: Urine, Random  Result Value Ref Range Status   Specimen Description   Final    URINE, RANDOM Performed at [facility], [address] [place], [place] [address]    Special Requests   Final    NONE Reflexed from [phone] Performed at [facility], [address] [place], [place] [address]    Culture >=100,000 COLONIES/mL KLEBSIELLA PNEUMONIAE (A)  Final   Report Status 09/20/2022 FINAL  Final   Organism ID, Bacteria KLEBSIELLA PNEUMONIAE (A)  Final      Susceptibility   Klebsiella pneumoniae - MIC*    AMPICILLIN >=32 RESISTANT Resistant     CEFAZOLIN <=4 SENSITIVE Sensitive     CEFEPIME <=0.12 SENSITIVE Sensitive     CEFTRIAXONE <=0.25 SENSITIVE Sensitive     CIPROFLOXACIN 1 RESISTANT Resistant     GENTAMICIN <=1 SENSITIVE Sensitive     IMIPENEM <=0.25 SENSITIVE Sensitive     NITROFURANTOIN 64 INTERMEDIATE Intermediate     TRIMETH/SULFA <=20 SENSITIVE Sensitive     AMPICILLIN/SULBACTAM >=32 RESISTANT Resistant     PIP/TAZO 16 SENSITIVE Sensitive     * >=100,000 COLONIES/mL KLEBSIELLA PNEUMONIAE  MRSA Next Gen by PCR, Nasal     Status: Abnormal   Collection Time: 09/18/22  3:08 PM   Specimen: Nasal Mucosa; Nasal Swab  Result Value Ref Range Status   MRSA by PCR Next Gen DETECTED (A) NOT DETECTED Final    Comment: CRITICAL RESULT CALLED TO, READ BACK BY AND VERIFIED WITH: Felecia Jan RN @ 1734 09/18/22 [facility] (NOTE) The GeneXpert MRSA Assay (FDA approved for NASAL specimens only), is one component of a comprehensive MRSA colonization  surveillance program. It is not intended to diagnose MRSA infection nor to guide or monitor treatment for MRSA infections. Test performance is not FDA approved in patients less than 89 years old. Performed at [facility], [address] [place], [place] [address]     Labs: CBC: Recent Labs  Lab 09/17/22 2035 09/18/22 0810 09/19/22 0342 09/20/22 0928 09/20/22 1245  WBC 34.4* 35.0* 29.6* QUESTIONABLE RESULTS, RECOMMEND RECOLLECT TO VERIFY 27.9*  NEUTROABS 31.0* 30.5*  --   --  24.5*  HGB 9.9* 10.2* 10.3* QUESTIONABLE RESULTS, RECOMMEND RECOLLECT TO VERIFY 9.0*  HCT 31.8* 32.6* 32.8* QUESTIONABLE RESULTS, RECOMMEND RECOLLECT TO VERIFY 28.8*  MCV 95.2 94.8 92.9 QUESTIONABLE RESULTS, RECOMMEND RECOLLECT TO VERIFY 94.7  PLT 414* 401* 384 QUESTIONABLE RESULTS, RECOMMEND RECOLLECT TO VERIFY 331   Basic Metabolic Panel: Recent Labs  Lab 09/17/22 2035 09/18/22 0810 09/19/22 0342 09/20/22 0928  NA 133* 136 137 136  K 4.4 4.4 4.0 3.7  CL 109 107 107 109  CO2 13* 18* 16* 16*  GLUCOSE 80 79 49* 78  BUN 37* 39* 37* 37*  CREATININE 2.16* 2.11* 1.93* 1.88*  CALCIUM 8.0* 8.6* 8.6* 8.0*   Liver Function Tests: Recent Labs  Lab 09/17/22 2035 09/18/22 0810 09/19/22 0342  AST 49* 56* 33  ALT 34 35 32  ALKPHOS 251* 229* 231*  BILITOT 0.8 1.1 1.2  PROT 4.4* 4.8* 4.8*  ALBUMIN <1.5* 1.5* <1.5*   CBG: No results for input(s): "GLUCAP" in the last 168 hours.  Discharge time spent: greater than 30 minutes.  Signed: Lucile Shutters, MD [facility] 09/21/2022

## 2022-09-21 NOTE — Plan of Care (Addendum)
Patient was drowsy but alert at time. PRN morphine and haldol was given. PIV  removed. Discharge to [facility] with hospice care. Report called in nurse at the facility.    Problem: Malnutrition  (NI-5.2) Goal: Food and/or nutrient delivery Description: Individualized approach for food/nutrient provision. Outcome: Adequate for Discharge   Problem: SLP Dysphagia Goals Goal: Misc Dysphagia Goal Outcome: Adequate for Discharge

## 2022-09-21 NOTE — Progress Notes (Signed)
Progress Note   Patient: Daniel Eaton. XBJ:[id number] DOB: 08/22/1940 DOA: 09/18/2022     3 DOS: the patient was seen and examined on 09/21/2022   Brief hospital course:  Arhaan Macewan. is a 82 y.o. male with medical history significant of HFrEF(35-40%), hypertension, COPD, A-fib, on Eliquis, PAD, anemia, CKD, morbid obesity, OSA on CPAP and hypothyroidism presenting with severe sepsis.  Limited history as patient is somewhat lethargic, baseline difficulty speaking.  Per report, patient with noted elevated white count at facility.  No reports of fevers chills, nausea or vomiting.  Patient noted to have been admitted June 23 through July 4 for severe sepsis with noted blood cultures growing out ESBL E. coli as well as urine culture growing out Klebsiella.  Completed extended course of IV ertapenem for treatment.  Baseline poor functional status in the setting of severe deconditioning per patient significant other at the bedside.  Is minimally ambulatory.  No reported cough or shortness of breath.  No reported dysuria or increased urinary frequency. Presented to the ER afebrile, heart rate 100s, initial systolic pressures in 80s-improved to the 110s after 2 3 L of LR IV fluid resuscitation.  White count 35, hemoglobin 10.2, platelets 4 1, troponin 60s to 40s.  Lactate 2.8-3.4.  Urinalysis indicative of infection, COVID flu RSV negative.  Creatinine 2.11 with GFR in the 30s.  CT chest abdomen pelvis noted with right lower lobe concerning for pneumonia.  Possible upper lobe pneumonia.   Assessment and Plan: Patient admitted to the hospital for severe sepsis with shock from Klebsiella pneumonia bacteremia.  He is currently on comfort measures and is awaiting discharge to the skilled nursing facility with hospice.      Subjective: Patient is seen and examined at the bedside.  He is sleeping but arouses easily  Physical Exam: Vitals:   09/20/22 0756 09/20/22 1149 09/20/22 1955 09/21/22 1125  BP:  104/76 (!) 106/57 108/61 103/61  Pulse: 100 (!) 101 99 (!) 108  Resp: 16 17 16 19   Temp: (!) 97.5 F (36.4 C) 97.6 F (36.4 C) 97.7 F (36.5 C) 98.2 F (36.8 C)  TempSrc:   Oral   SpO2: 98% 96% 97% 98%  Weight:      Height:        Vitals and nursing note reviewed.  HENT:     Nose: Nose normal.     Mouth/Throat:     Mouth: Mucous membranes are moist.  Eyes:     Comments: Pale conjunctiva  Cardiovascular:     Rate and Rhythm: Tachycardia present.  Pulmonary:     Effort: Pulmonary effort is normal.     Breath sounds: Normal breath sounds.  Abdominal:     Palpations: Abdomen is soft.     Tenderness: There is abdominal tenderness.     Comments: Tender in the right lower quadrant  Musculoskeletal:     Cervical back: Normal range of motion and neck supple.  Skin:    General: Skin is warm and dry.  Neurological:     Mental Status: He is alert.     Motor: Weakness present.  Psychiatric:        Mood and Affect: Mood normal.        Behavior: Behavior normal.    Data Reviewed: No new labs. There are no new results to review at this time.  Family Communication: None  Disposition: Status is: Inpatient Remains inpatient appropriate because: Awaiting discharge to skilled nursing facility  Planned Discharge  Destination: Skilled nursing facility    Time spent: 30 minutes  Author: Lucile Shutters, MD 09/21/2022 11:41 AM  For on call review [email or web address].

## 2022-09-21 NOTE — Progress Notes (Signed)
Pt is comfort care now- will sign off

## 2022-09-21 NOTE — TOC Initial Note (Signed)
Transition of Care (TOC) - Initial/Assessment Note    Patient Details  Name: Daniel Eaton. MRN: [id number] Date of Birth: [birth year]  Transition of Care [facility]) CM/SW Contact:    Truddie Hidden, RN Phone Number: 09/21/2022, 10:12 AM  Clinical Narrative:                 Spoke with patient's significant other [name]. She is requesting hospice via [facility]. [name] was advised a representative would contact her.  Referral made to [facility] from [employer].        Patient Goals and CMS Choice            Expected Discharge Plan and Services                                              Prior Living Arrangements/Services                       Activities of Daily Living Home Assistive Devices/Equipment: Hospital bed ADL Screening (condition at time of admission) Patient's cognitive ability adequate to safely complete daily activities?: Yes Is the patient deaf or have difficulty hearing?: No Does the patient have difficulty seeing, even when wearing glasses/contacts?: No Does the patient have difficulty concentrating, remembering, or making decisions?: Yes Patient able to express need for assistance with ADLs?: Yes Does the patient have difficulty dressing or bathing?: Yes Independently performs ADLs?: No Communication: Independent, Dependent Is this a change from baseline?: Pre-admission baseline Dressing (OT): Dependent Is this a change from baseline?: Pre-admission baseline Grooming: Dependent Is this a change from baseline?: Pre-admission baseline Feeding: Dependent Is this a change from baseline?: Pre-admission baseline Bathing: Dependent Is this a change from baseline?: Pre-admission baseline In/Out Bed: Dependent Is this a change from baseline?: Pre-admission baseline Walks in Home: Dependent Is this a change from baseline?: Pre-admission baseline Does the patient have difficulty walking or climbing stairs?: Yes Weakness of Legs:  Both Weakness of Arms/Hands: Both  Permission Sought/Granted                  Emotional Assessment              Admission diagnosis:  Morbid obesity (HCC) [E66.01] Sepsis (HCC) [A41.9] Severe sepsis (HCC) [A41.9, R65.20] Urinary tract infection without hematuria, site unspecified [N39.0] Acute renal failure, unspecified acute renal failure type (HCC) [N17.9] Pneumonia due to infectious organism, unspecified laterality, unspecified part of lung [J18.9] Pressure injury of skin of contiguous region involving back, buttock, and hip, unspecified injury stage, unspecified laterality [L89.40] Chronic wound [T14.8XXA] Patient Active Problem List   Diagnosis Date Noted   Protein-calorie malnutrition, severe 09/19/2022   Complicated UTI (urinary tract infection) 08/11/2022   Hypotension 08/10/2022   Bacteremia due to Escherichia coli 08/10/2022   Bacteremia due to Klebsiella pneumoniae 08/10/2022   Infection due to extended spectrum beta lactamase (ESBL) producing bacteria 08/10/2022   Septic shock (HCC) 08/09/2022   PAD (peripheral artery disease) (HCC) 08/09/2022   Severe sepsis with septic shock (HCC) 08/08/2022   Vitamin B12 deficiency 07/07/2022   Leg hematoma, left, initial encounter 06/22/2022   Lactic acidosis 06/22/2022   Macrocytic anemia 06/06/2022   Cellulitis of left lower extremity 02/14/2021   COPD (chronic obstructive pulmonary disease) (HCC)    Stroke (HCC)    Acute renal failure superimposed on stage 3a chronic kidney disease (HCC)  Elevated troponin    Normocytic anemia    Generalized weakness    Acute renal failure superimposed on chronic kidney disease (HCC)    Myalgia due to statin 12/18/2019   Atrial fibrillation (HCC) 04/09/2019   Essential hypertension 04/09/2019   Venous stasis dermatitis of both lower extremities 04/09/2019   Lower limb ulcer, ankle, right, limited to breakdown of skin (HCC) 04/09/2019   Rhabdomyolysis 12/10/2018   Impairment  of balance 10/02/2018   Physical deconditioning 10/02/2018   Sepsis (HCC) 07/28/2018   Primary osteoarthritis of right knee 09/22/2017   Healthcare maintenance 03/16/2017   Aortic atherosclerosis (HCC) 03/15/2016   Cough 03/20/2015   Bronchitis, chronic obstructive w acute bronchitis (HCC) 03/20/2015   Umbilical hernia without obstruction and without gangrene 01/12/2015   Ventral hernia without obstruction or gangrene 01/02/2015   Morbid obesity with BMI of 45.0-49.9, adult (HCC) 11/23/2013   Achalasia 06/30/2013   Cardiomyopathy (HCC) 06/30/2013   OSA (obstructive sleep apnea) 06/30/2013   Hypothyroid 06/30/2013   PCP:  Sherol Dade, DO Pharmacy:   [facility] [place] - [address] [place] [address] Phone: [phone] Fax: [phone]     Social Determinants of Health (SDOH) Social History: SDOH Screenings   Food Insecurity: No Food Insecurity (09/18/2022)  Housing: Low Risk  (09/18/2022)  Transportation Needs: No Transportation Needs (09/18/2022)  Utilities: Not At Risk (09/18/2022)  Depression (PHQ2-9): Low Risk  (06/06/2022)  Tobacco Use: Medium Risk (09/18/2022)   SDOH Interventions:     Readmission Risk Interventions    08/10/2022    3:34 PM  Readmission Risk Prevention Plan  PCP or Specialist Appt within 3-5 Days Complete  Social Work Consult for Recovery Care Planning/Counseling Complete  Palliative Care Screening Not Applicable  Medication Review Oceanographer) Complete

## 2022-09-21 NOTE — Progress Notes (Signed)
   09/20/22 2030  Spiritual Encounters  Type of Visit Follow up  Care provided to: Patient  Conversation partners present during encounter Nurse  Referral source Nurse (RN/NT/LPN)  Reason for visit Advance directives  OnCall Visit Yes  Advance Directives (For Healthcare)  Does Patient Have a Medical Advance Directive? Yes  Does patient want to make changes to medical advance directive? No - Patient declined  Type of Advance Directive Out of facility DNR (pink MOST or yellow form)   Chaplain went to visit patient about the advance directive but he was sleeping. Spoke with the nurse and she said she will notify chaplain when the patients wife comes to visit. Chaplain will follow up with patient and spouse later.

## 2022-09-21 NOTE — Progress Notes (Signed)
Daily Progress Note   Patient Name: Daniel Eaton.       Date: 09/21/2022 DOB: 1940-08-26  Age: 82 y.o. MRN#: [id number] Attending Physician: Lucile Shutters, MD Primary Care Physician: Sherol Dade, DO Admit Date: 09/18/2022  Reason for Consultation/Follow-up: Establishing goals of care  Subjective: Notes reviewed.  In to see patient.  Currently he is resting in bed with no family at bedside.  He is currently awaiting discharge with hospice to follow.  Length of Stay: 3  Current Medications: Scheduled Meds:   feeding supplement (NEPRO CARB STEADY)  237 mL Oral TID BM   fluticasone furoate-vilanterol  1 puff Inhalation Daily   And   umeclidinium bromide  1 puff Inhalation Daily   liver oil-zinc oxide   Topical BID    Continuous Infusions:  sodium chloride 5 mL/hr at 09/20/22 0605    PRN Meds: sodium chloride, acetaminophen **OR** acetaminophen, albuterol, antiseptic oral rinse, glycopyrrolate **OR** glycopyrrolate **OR** glycopyrrolate, haloperidol **OR** haloperidol **OR** haloperidol lactate, morphine CONCENTRATE, ondansetron **OR** ondansetron (ZOFRAN) IV, polyvinyl alcohol  Physical Exam Pulmonary:     Effort: Pulmonary effort is normal.  Neurological:     Mental Status: He is alert.             Vital Signs: BP 103/61 (BP Location: Left Arm)   Pulse (!) 108   Temp 98.2 F (36.8 C)   Resp 19   Ht 5\' 11"  (1.803 m)   Wt 104.3 kg   SpO2 98%   BMI 32.08 kg/m  SpO2: SpO2: 98 % O2 Device: O2 Device: Room Air O2 Flow Rate:    Intake/output summary:  Intake/Output Summary (Last 24 hours) at 09/21/2022 1329 Last data filed at 09/20/2022 1715 Gross per 24 hour  Intake --  Output 600 ml  Net -600 ml   LBM: Last BM Date :  (patient unable to  remember) Baseline Weight: Weight: 104.3 kg Most recent weight: Weight: 104.3 kg    Patient Active Problem List   Diagnosis Date Noted   Protein-calorie malnutrition, severe 09/19/2022   Complicated UTI (urinary tract infection) 08/11/2022   Hypotension 08/10/2022   Bacteremia due to Escherichia coli 08/10/2022   Bacteremia due to Klebsiella pneumoniae 08/10/2022   Infection due to extended spectrum beta lactamase (ESBL) producing bacteria 08/10/2022   Septic shock (HCC) 08/09/2022  PAD (peripheral artery disease) (HCC) 08/09/2022   Severe sepsis with septic shock (HCC) 08/08/2022   Vitamin B12 deficiency 07/07/2022   Leg hematoma, left, initial encounter 06/22/2022   Lactic acidosis 06/22/2022   Macrocytic anemia 06/06/2022   Cellulitis of left lower extremity 02/14/2021   COPD (chronic obstructive pulmonary disease) (HCC)    Stroke (HCC)    Acute renal failure superimposed on stage 3a chronic kidney disease (HCC)    Elevated troponin    Normocytic anemia    Generalized weakness    Acute renal failure superimposed on chronic kidney disease (HCC)    Myalgia due to statin 12/18/2019   Atrial fibrillation (HCC) 04/09/2019   Essential hypertension 04/09/2019   Venous stasis dermatitis of both lower extremities 04/09/2019   Lower limb ulcer, ankle, right, limited to breakdown of skin (HCC) 04/09/2019   Rhabdomyolysis 12/10/2018   Impairment of balance 10/02/2018   Physical deconditioning 10/02/2018   Sepsis (HCC) 07/28/2018   Primary osteoarthritis of right knee 09/22/2017   Healthcare maintenance 03/16/2017   Aortic atherosclerosis (HCC) 03/15/2016   Cough 03/20/2015   Bronchitis, chronic obstructive w acute bronchitis (HCC) 03/20/2015   Umbilical hernia without obstruction and without gangrene 01/12/2015   Ventral hernia without obstruction or gangrene 01/02/2015   Morbid obesity with BMI of 45.0-49.9, adult (HCC) 11/23/2013   Achalasia 06/30/2013   Cardiomyopathy (HCC)  06/30/2013   OSA (obstructive sleep apnea) 06/30/2013   Hypothyroid 06/30/2013    Palliative Care Assessment & Plan     Recommendations/Plan: Plans in place to return to facility with hospice to follow.  Code Status:    Code Status Orders  (From admission, onward)           Start     Ordered   09/21/22 1154  Do not attempt resuscitation (DNR)  Continuous       Question Answer Comment  If patient has no pulse and is not breathing Do Not Attempt Resuscitation   If patient has a pulse and/or is breathing: Medical Treatment Goals COMFORT MEASURES: Keep clean/warm/dry, use medication by any route; positioning, wound care and other measures to relieve pain/suffering; use oxygen, suction/manual treatment of airway obstruction for comfort; do not transfer unless for comfort needs.   Consent: Discussion documented in EHR or advanced directives reviewed      09/21/22 1154           Code Status History     Date Active Date Inactive Code Status Order ID Comments User Context   09/18/2022 1222 09/21/2022 1154 DNR [id number]  Floydene Flock, MD ED   08/08/2022 2010 08/17/2022 2354 Full Code [id number]  Gertha Calkin, MD ED   06/29/2022 1250 06/29/2022 2007 DNR [id number]  Joylene Draft, NP Inpatient   06/22/2022 2127 06/29/2022 1250 Full Code [id number]  Gertha Calkin, MD ED   02/14/2021 0851 02/18/2021 1630 Full Code [id number]  Lorretta Harp, MD ED   12/10/2018 2238 12/14/2018 2230 Full Code [id number]  Houston Siren, MD Inpatient   07/31/2018 1149 08/03/2018 1609 Full Code [id number]  Jama Flavors, MD Inpatient   07/28/2018 1109 07/31/2018 1149 DNR [id number]  Jama Flavors, MD ED      Advance Directive Documentation    Flowsheet Row Most Recent Value  Type of Advance Directive Out of facility DNR (pink MOST or yellow form)  Pre-existing out of facility DNR order (yellow form or pink MOST form) --  "MOST" Form in Place? --  Prognosis:  < 6 months   Thank you for allowing the  Palliative Medicine Team to assist in the care of this patient.   Morton Stall, NP  Please contact Palliative Medicine Team phone at [phone] for questions and concerns.

## 2022-09-21 NOTE — Progress Notes (Addendum)
SLP Cancellation Note  Patient Details Name: Daniel Eaton. MRN: [id number] DOB: 05-21-1940   Cancelled treatment:       Reason Eval/Treat Not Completed:  (chart reviewed; consulted Palliative Care, then NSG re: diet.)  Per Palliative Care meeting w/ Family, pt has now transitioned to Comfort Care status. Pt currently remains on a dysphagia diet; discussed this w/ NSG as well as aspiration precautions and frequent oral hygiene for comfort.  ST services will sign off at this time w/ MD to reconsult if any new needs.     Jerilynn Som, MS, CCC-SLP Speech Language Pathologist Rehab Services; [facility] [phone] (ascom) , 09/21/2022, 8:16 AM

## 2022-09-21 NOTE — Care Management Important Message (Signed)
Important Message  Patient Details  Name: Daniel Eaton. MRN: [id number] Date of Birth: 09-23-1940   Medicare Important Message Given:  Other (see comment)  Patient is on comfort care and discharging with hospice. Out of respect for the patient and family no Important Message from [facility] given.   Olegario Messier A  09/21/2022, 1:53 PM

## 2022-09-21 NOTE — TOC Transition Note (Signed)
Transition of Care [facility]) - CM/SW Discharge Note   Patient Details  Name: Daniel Eaton. MRN: [id number] Date of Birth: 02/02/1941  Transition of Care [facility]) CM/SW Contact:  Garret Reddish, RN Phone Number: 09/21/2022, 2:46 PM   Clinical Narrative:   Chart reviewed.  Noted that patient will be a discharge back to [facility] with [facility] following.    I have spoken with Gavin Pound, Admission Coordinator at [facility].  She informs me that  patient is able to return to [facility].  Gavin Pound reports that patient was a LTC resident at [facility] and [facility] was following patient at the facility.  Gavin Pound informs me that patient will be going to room 224 B and the number to call report is [phone].  I have sent Deborah patient's Discharge Summary, DC orders, and SNF Transfer Summary.  Gavin Pound reports that patient will not require an Fl 2.    I have spoken with [name] patient's significant other.  She informs me that she is agreeable with patient returning to [facility] with Hospice following.  She was not aware that [facility] was following patient at the facility.  She would like to continue to use [facility] to follow patient at the facility.  I have asked Gavin Pound with [facility] to inform  [facility] to reach out to Mrs. Harris.  She in interested in services that they can offer for when Mr. Schill becomes end of life.  I have informed Mrs. Harris that [facility] will transport patient back to the facility today.    I have arranged ambulance transport via [facility] to transport patient back to the facility today.    I have informed staff nurse of the above information.        Final next level of care: Long Term Acute Care (LTAC) (LTC with Hospice following) Barriers to Discharge: No Barriers Identified   Patient Goals and CMS Choice CMS Medicare.gov Compare Post Acute Care list provided to:: Patient Represenative (must  comment) [name]( patient significant other)) Choice offered to / list presented to :  (Patient's Significant other)  Discharge Placement                Patient chooses bed at: [facility] Patient to be transferred to facility by: [facility] Name of family member notified: [name] Patient and family notified of of transfer: 09/21/22  Discharge Plan and Services Additional resources added to the After Visit Summary for                                       Social Determinants of Health (SDOH) Interventions SDOH Screenings   Food Insecurity: No Food Insecurity (09/18/2022)  Housing: Low Risk  (09/18/2022)  Transportation Needs: No Transportation Needs (09/18/2022)  Utilities: Not At Risk (09/18/2022)  Depression (PHQ2-9): Low Risk  (06/06/2022)  Tobacco Use: Medium Risk (09/18/2022)     Readmission Risk Interventions    08/10/2022    3:34 PM  Readmission Risk Prevention Plan  PCP or Specialist Appt within 3-5 Days Complete  Social Work Consult for Recovery Care Planning/Counseling Complete  Palliative Care Screening Not Applicable  Medication Review Oceanographer) Complete

## 2022-09-21 NOTE — Progress Notes (Signed)
[facility] Liaison Note:  Patient is not appropriate for admission to the Hospice Home at this time.  Patient's significant other, [name], was notified.  She stated that she would like for the patient to return to [facility], with Hospice services, when medically stable for discharge. She reported that the patient was not receiving skilled services at [facility].  Referral submitted today for Hospice follow up at [facility] when patient is medically stable for discharge.  Attending, TOC and Palliative providers are aware.    Please don't hesitate to call with any Hospice related questions or concerns.    Thank you for the opportunity to participate in this patient's care.  [facility] Liaison [phone]

## 2022-10-07 ENCOUNTER — Telehealth: Payer: Self-pay | Admitting: Oncology

## 2022-10-07 NOTE — Telephone Encounter (Signed)
August 19th this pt passed away, pt spouse called to let us know. Appts have been canceled.

## 2022-10-14 ENCOUNTER — Other Ambulatory Visit: Payer: PPO

## 2022-10-14 ENCOUNTER — Ambulatory Visit: Payer: PPO | Admitting: Oncology

## 2022-10-16 DEATH — deceased

## 2023-12-29 IMAGING — US US EXTREM LOW VENOUS*R*
1 series · 14 of 24 positions shown · non-contrast
Comparison: None Available.

CLINICAL DATA: Right lower extremity bruising. Recent trauma to the
right lower extremity.

EXAM:
Right LOWER EXTREMITY VENOUS DOPPLER ULTRASOUND
TECHNIQUE: Gray-scale sonography with compression, as well as color and duplex
ultrasound, were performed to evaluate the deep venous system(s)
from the level of the common femoral vein through the popliteal and
proximal calf veins.

[Series 1: us venous img lower uni right (dvt) · portal-venous · 14 of 36 slices shown]
[im 1/36]
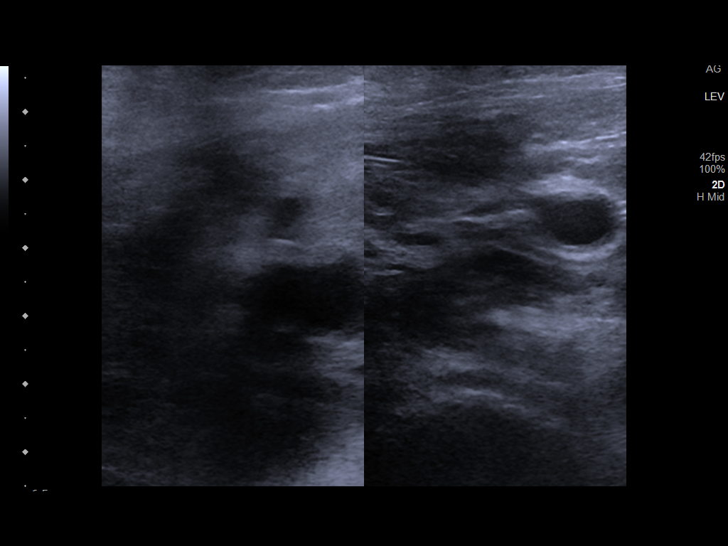
[im 4/36]
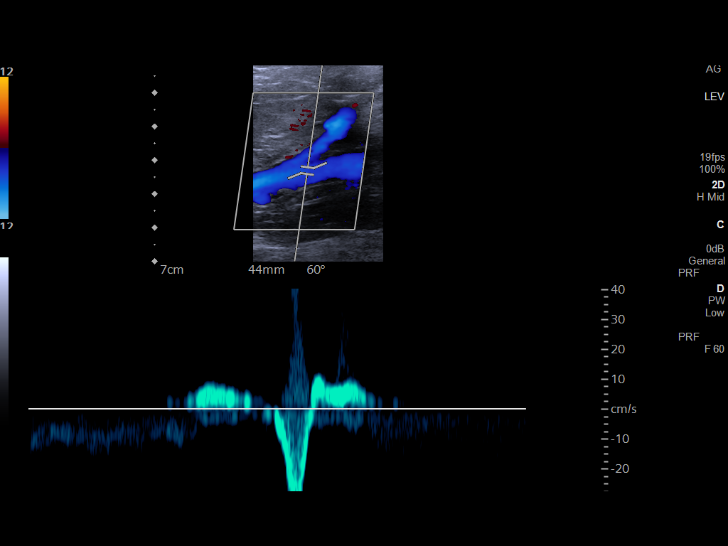
[im 7/36]
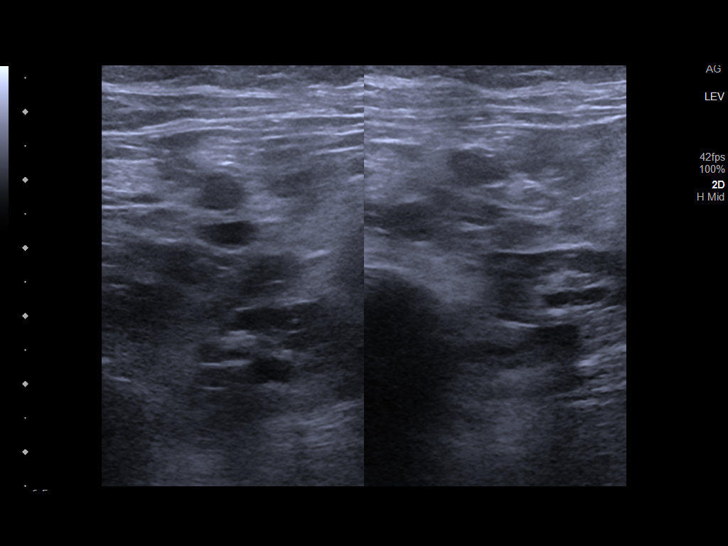
[im 10/36]
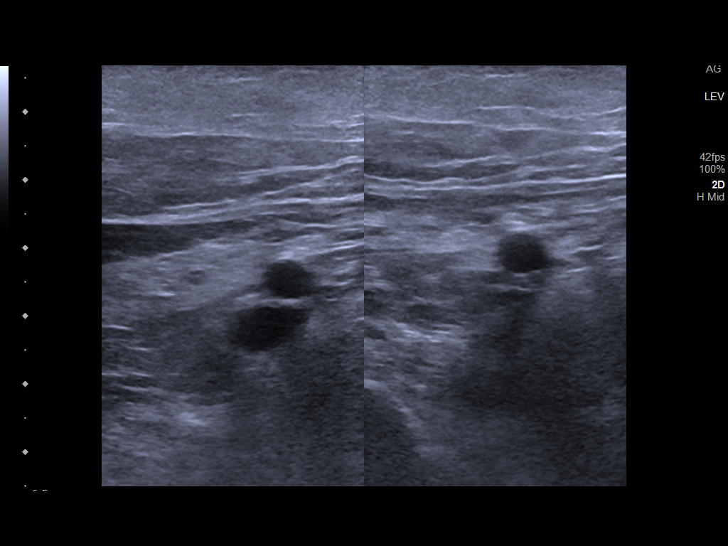
[im 11/36]
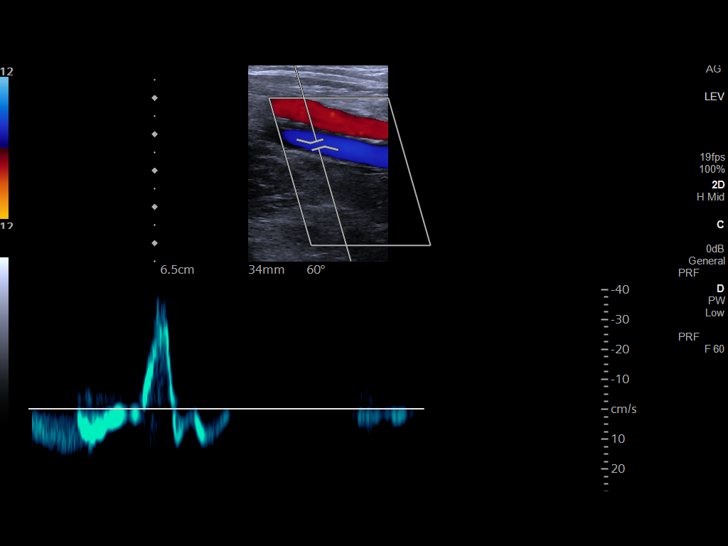
[im 14/36]
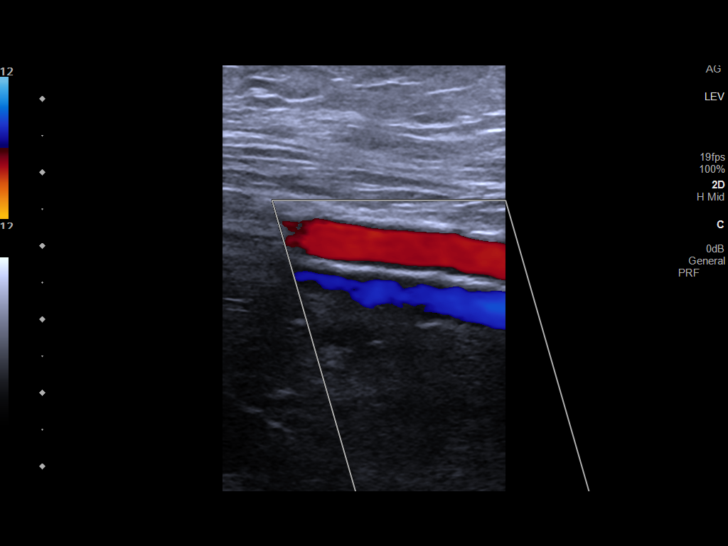
[im 17/36]
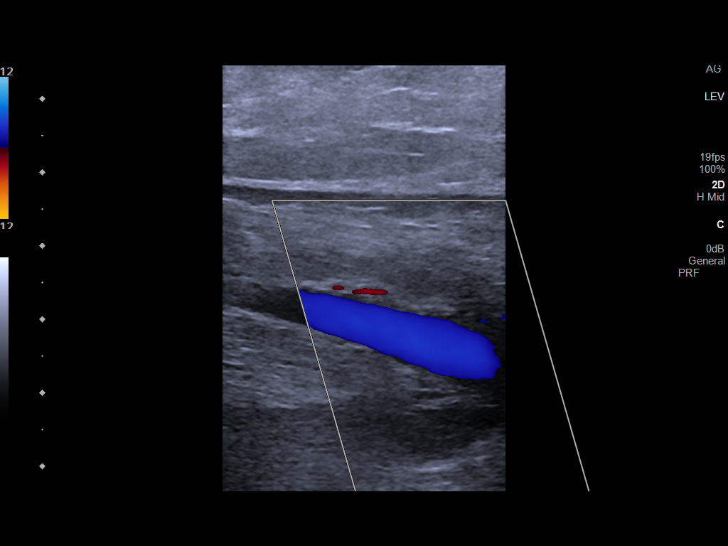
[im 19/36]
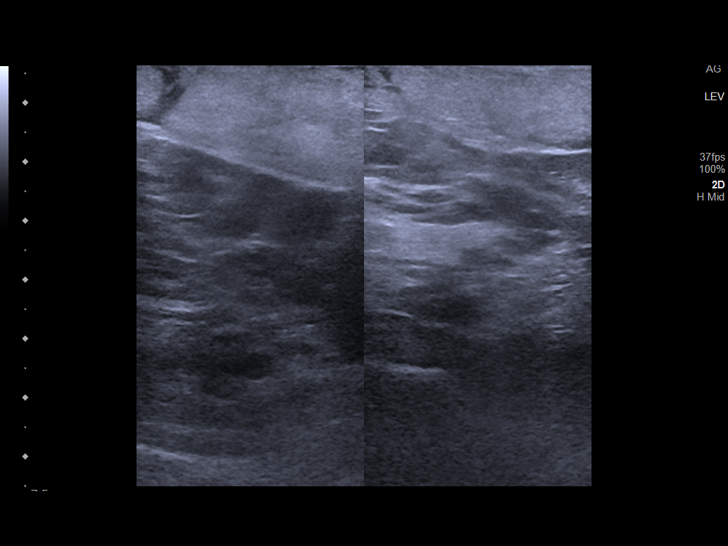
[im 22/36]
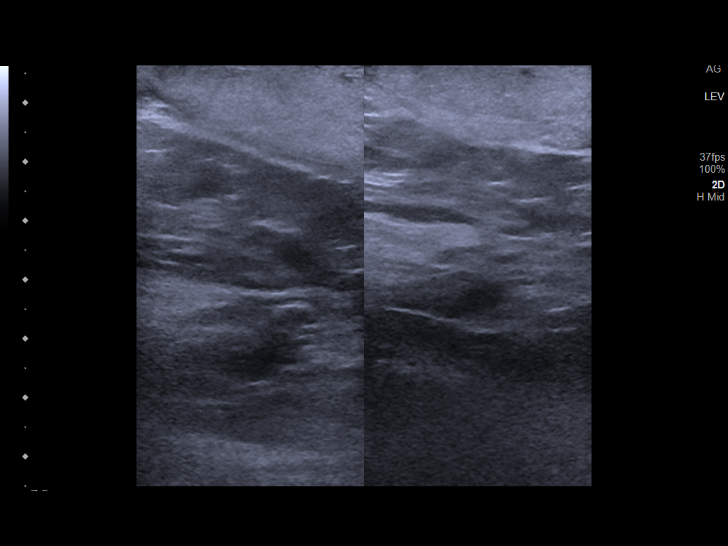
[im 25/36]
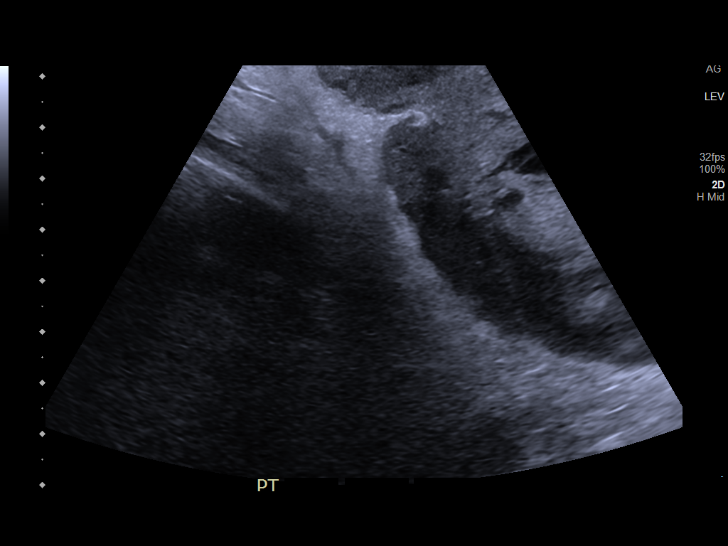
[im 28/36]
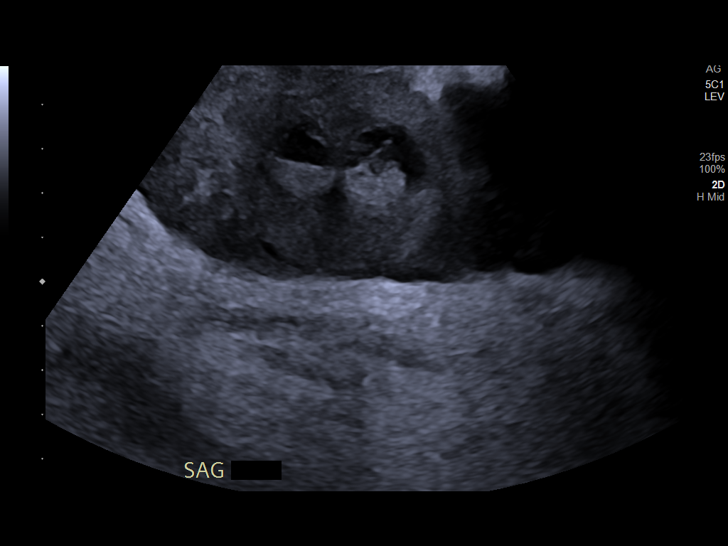
[im 29/36]
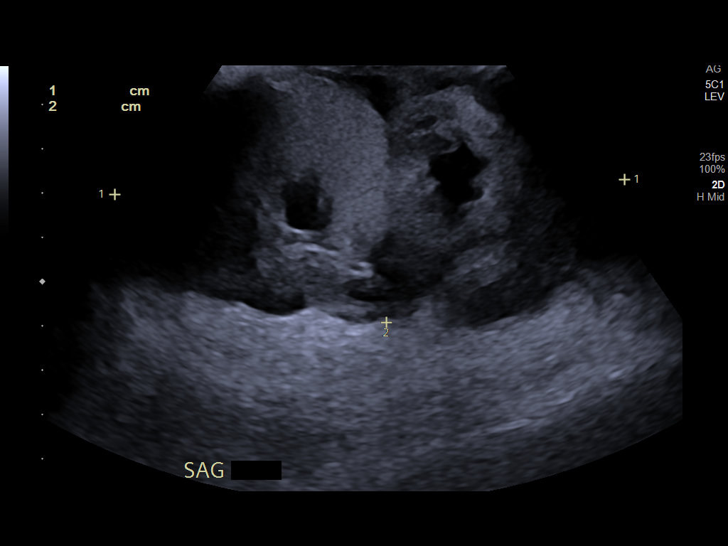
[im 32/36]
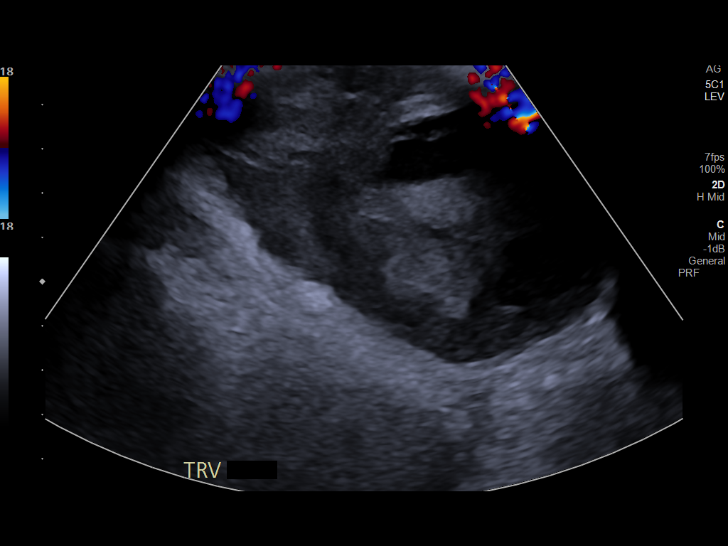
[im 36/36]
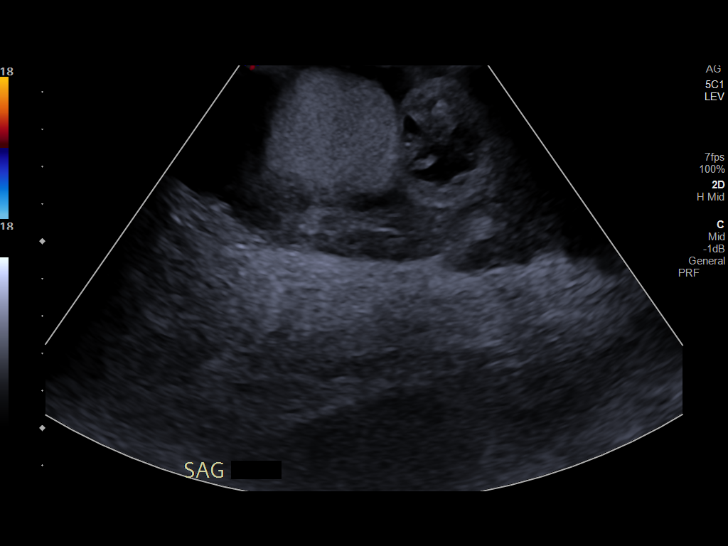

[14 of 24 positions shown; findings below may reference images not displayed]

FINDINGS: VENOUS

Normal compressibility of the common femoral, superficial femoral,
and popliteal veins. The calf veins are not visualized. Visualized
portions of profunda femoral vein and great saphenous vein
unremarkable. No filling defects to suggest DVT on grayscale or
color Doppler imaging. Doppler waveforms show normal direction of
venous flow, normal respiratory plasticity and response to
augmentation.

Limited views of the contralateral common femoral vein are
unremarkable.

OTHER

None.

Limitations: An 11 x 12 x 6 cm soft tissue hematoma in the right
calf. Please see report for the dedicated soft tissue ultrasound.
IMPRESSION: 1. No evidence of DVT in the visualized right lower extremity veins.
2. Large soft tissue hematoma.
